# Patient Record
Sex: Male | Born: 1940 | Race: White | Hispanic: No | Smoking: Former smoker
Health system: Southern US, Community
[De-identification: ages and names within clinical notes are randomized; demographics above are authoritative.]

## PROBLEM LIST (undated history)

## (undated) DIAGNOSIS — I2510 Atherosclerotic heart disease of native coronary artery without angina pectoris: Secondary | ICD-10-CM

## (undated) DIAGNOSIS — N189 Chronic kidney disease, unspecified: Secondary | ICD-10-CM

## (undated) DIAGNOSIS — F0280 Dementia in other diseases classified elsewhere without behavioral disturbance: Secondary | ICD-10-CM

## (undated) DIAGNOSIS — G4733 Obstructive sleep apnea (adult) (pediatric): Secondary | ICD-10-CM

## (undated) DIAGNOSIS — E785 Hyperlipidemia, unspecified: Secondary | ICD-10-CM

## (undated) DIAGNOSIS — I447 Left bundle-branch block, unspecified: Secondary | ICD-10-CM

## (undated) DIAGNOSIS — I509 Heart failure, unspecified: Secondary | ICD-10-CM

## (undated) HISTORY — DX: Obstructive sleep apnea (adult) (pediatric): G47.33

## (undated) HISTORY — DX: Hyperlipidemia, unspecified: E78.5

## (undated) HISTORY — DX: Chronic kidney disease, unspecified: N18.9

## (undated) HISTORY — PX: OTHER SURGICAL HISTORY: SHX169

## (undated) HISTORY — DX: Atherosclerotic heart disease of native coronary artery without angina pectoris: I25.10

---
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
## 1999-07-13 ENCOUNTER — Encounter: Payer: Self-pay | Admitting: Family Medicine

## 1999-07-13 ENCOUNTER — Encounter: Admission: RE | Admit: 1999-07-13 | Discharge: 1999-07-13 | Payer: Self-pay | Admitting: Family Medicine

## 2003-05-02 ENCOUNTER — Inpatient Hospital Stay (HOSPITAL_BASED_OUTPATIENT_CLINIC_OR_DEPARTMENT_OTHER): Admission: RE | Admit: 2003-05-02 | Discharge: 2003-05-02 | Payer: Self-pay | Admitting: Cardiology

## 2003-05-02 HISTORY — PX: CARDIAC CATHETERIZATION: SHX172

## 2003-09-21 ENCOUNTER — Ambulatory Visit (HOSPITAL_BASED_OUTPATIENT_CLINIC_OR_DEPARTMENT_OTHER): Admission: RE | Admit: 2003-09-21 | Discharge: 2003-09-21 | Payer: Self-pay | Admitting: Internal Medicine

## 2004-07-29 ENCOUNTER — Ambulatory Visit (HOSPITAL_COMMUNITY): Admission: RE | Admit: 2004-07-29 | Discharge: 2004-07-29 | Payer: Self-pay | Admitting: Gastroenterology

## 2004-07-29 ENCOUNTER — Encounter (INDEPENDENT_AMBULATORY_CARE_PROVIDER_SITE_OTHER): Payer: Self-pay | Admitting: Specialist

## 2004-10-19 ENCOUNTER — Ambulatory Visit: Payer: Self-pay | Admitting: Internal Medicine

## 2005-05-11 DIAGNOSIS — I2510 Atherosclerotic heart disease of native coronary artery without angina pectoris: Secondary | ICD-10-CM | POA: Insufficient documentation

## 2005-05-11 DIAGNOSIS — N401 Enlarged prostate with lower urinary tract symptoms: Secondary | ICD-10-CM | POA: Insufficient documentation

## 2006-02-02 ENCOUNTER — Ambulatory Visit: Payer: Self-pay | Admitting: Internal Medicine

## 2006-06-02 ENCOUNTER — Encounter: Admission: RE | Admit: 2006-06-02 | Discharge: 2006-06-02 | Payer: Self-pay | Admitting: Family Medicine

## 2006-06-13 ENCOUNTER — Encounter: Admission: RE | Admit: 2006-06-13 | Discharge: 2006-09-11 | Payer: Self-pay | Admitting: Family Medicine

## 2006-09-12 ENCOUNTER — Encounter: Payer: Self-pay | Admitting: Cardiology

## 2006-09-15 DIAGNOSIS — K2100 Gastro-esophageal reflux disease with esophagitis, without bleeding: Secondary | ICD-10-CM | POA: Insufficient documentation

## 2006-09-27 DIAGNOSIS — E46 Unspecified protein-calorie malnutrition: Secondary | ICD-10-CM | POA: Insufficient documentation

## 2006-11-21 DIAGNOSIS — H26229 Cataract secondary to ocular disorders (degenerative) (inflammatory), unspecified eye: Secondary | ICD-10-CM | POA: Insufficient documentation

## 2007-01-18 ENCOUNTER — Ambulatory Visit: Payer: Self-pay | Admitting: Internal Medicine

## 2007-05-28 DIAGNOSIS — G4730 Sleep apnea, unspecified: Secondary | ICD-10-CM | POA: Insufficient documentation

## 2008-01-05 ENCOUNTER — Encounter: Admission: RE | Admit: 2008-01-05 | Discharge: 2008-01-05 | Payer: Self-pay | Admitting: Specialist

## 2008-01-13 ENCOUNTER — Encounter: Payer: Self-pay | Admitting: Internal Medicine

## 2008-01-18 ENCOUNTER — Ambulatory Visit: Payer: Self-pay | Admitting: Internal Medicine

## 2008-01-18 DIAGNOSIS — I2510 Atherosclerotic heart disease of native coronary artery without angina pectoris: Secondary | ICD-10-CM | POA: Insufficient documentation

## 2008-01-18 DIAGNOSIS — E785 Hyperlipidemia, unspecified: Secondary | ICD-10-CM | POA: Insufficient documentation

## 2008-01-18 DIAGNOSIS — G4733 Obstructive sleep apnea (adult) (pediatric): Secondary | ICD-10-CM | POA: Insufficient documentation

## 2008-03-01 ENCOUNTER — Encounter: Admission: RE | Admit: 2008-03-01 | Discharge: 2008-03-01 | Payer: Self-pay | Admitting: Specialist

## 2008-04-01 ENCOUNTER — Telehealth (INDEPENDENT_AMBULATORY_CARE_PROVIDER_SITE_OTHER): Payer: Self-pay | Admitting: *Deleted

## 2008-04-04 ENCOUNTER — Ambulatory Visit (HOSPITAL_BASED_OUTPATIENT_CLINIC_OR_DEPARTMENT_OTHER): Admission: RE | Admit: 2008-04-04 | Discharge: 2008-04-04 | Payer: Self-pay | Admitting: Specialist

## 2008-06-12 ENCOUNTER — Ambulatory Visit (HOSPITAL_COMMUNITY): Admission: RE | Admit: 2008-06-12 | Discharge: 2008-06-12 | Payer: Self-pay | Admitting: Cardiology

## 2008-06-12 HISTORY — PX: CARDIAC CATHETERIZATION: SHX172

## 2009-01-15 ENCOUNTER — Encounter: Payer: Self-pay | Admitting: Internal Medicine

## 2009-01-16 ENCOUNTER — Ambulatory Visit: Payer: Self-pay | Admitting: Internal Medicine

## 2009-03-18 DIAGNOSIS — L821 Other seborrheic keratosis: Secondary | ICD-10-CM | POA: Insufficient documentation

## 2009-03-18 DIAGNOSIS — D369 Benign neoplasm, unspecified site: Secondary | ICD-10-CM | POA: Insufficient documentation

## 2009-10-18 ENCOUNTER — Encounter: Payer: Self-pay | Admitting: Internal Medicine

## 2009-12-23 DIAGNOSIS — I10 Essential (primary) hypertension: Secondary | ICD-10-CM | POA: Insufficient documentation

## 2009-12-23 DIAGNOSIS — L570 Actinic keratosis: Secondary | ICD-10-CM | POA: Insufficient documentation

## 2010-01-15 ENCOUNTER — Ambulatory Visit: Payer: Self-pay | Admitting: Internal Medicine

## 2010-01-19 ENCOUNTER — Ambulatory Visit: Payer: Self-pay | Admitting: Cardiology

## 2010-04-01 NOTE — Assessment & Plan Note (Signed)
Summary: rov 1 yr ///kp   Primary Brylen Wagar/Referring Kamry Faraci:  Herb Grays  CC:  Yearly follow up visit-sleep; has trouble falling asleep sometimes. .  History of Present Illness: CAD (ICD-414.00) OBSTRUCTIVE SLEEP APNEA (ICD-327.23)  HISTORY:  He remains comfortable on CPAP at 10-CWP.  He has the computer chip to turn in from his machine having recently gotten a new machine but he feels comfortable with it.  He has had flu vaccine.   01/18/08- Sleep apnea Excellent CPAP compliance. Follow up check showed 10 cwp gave AHI 2.6 so control is good. Discussed mask fit. He is satisfied. Discussed flu season- got flu vax.  Feb 07, 2009- OSA Tried full face cpap mask but it leaks too much. He has a download card ready to turn in. Also asks about specialty pillow. had flu vax.  January 15, 2010-- OSA Nurse-CC: Yearly follow up visit-sleep; has trouble falling asleep sometimes.  Lost his wife 3 months ago. He is adjusting, but insomnia component with grieving.  He uses CPAP all night and for any naps. Current mask is full face. We spoke about different mask styles.    Preventive Screening-Counseling & Management  Alcohol-Tobacco     Smoking Status: quit     Year Quit: 90's  Current Medications (verified): 1)  Slo-Niacin 500 Mg Cr-Tabs (Niacin) .... Take 1 By Mouth  Two Times A Day 2)  Aspirin Adult Low Strength 81 Mg Tbec (Aspirin) .... Take 1 By Mouth Once Daily 3)  Centrum Silver  Tabs (Multiple Vitamins-Minerals) .... Take 1 By Mouth Once Daily 4)  Glucosamine 1500 Complex  Caps (Glucosamine-Chondroit-Vit C-Mn) .... Take 1 By Mouth Once Daily 5)  Red Yeast Rice 600 Mg Tabs (Red Yeast Rice Extract) .... Take As Directed 6)  Vitamin C 500 Mg Tabs (Ascorbic Acid) .... Take As Directed 7)  Fish Oil 1000 Mg Caps (Omega-3 Fatty Acids) .... Take As Directed 8)  Cpap 10 [facility] 9)  Replacement Cpap Mask and Supplies .... Dx Obstructive Sleep Apnea 10)  Crestor 20 Mg Tabs  (Rosuvastatin Calcium) .... Take 1/2 By Mouth Every Other Day 11)  Lasix 20 Mg Tabs (Furosemide) .... Take 1 By Mouth At Bedtime  Allergies (verified): 1)  ! Sulfa  Past History:  Past Medical History: Last updated: 01/18/2008 OBSTRUCTIVE SLEEP APNEA (ICD-327.23) HYPERLIPIDEMIA (ICD-272.4) CAD (ICD-414.00)  Past Surgical History: Last updated: February 07, 2009 Right shoulder left wrist/ arm- repair fx  Family History: Last updated: 2009/02/07 Mother- died esophageal cancer Father- died age 40 "od age' Brother- - had lymphoma  Social History: Last updated: 01/15/2010 Patient states former smoker. 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    Vital Signs:  Patient profile:   70 year old male Weight:      213.25 pounds O2 Sat:      96 % on Room air Pulse rate:   76 / minute BP sitting:   146 / 84  (left arm) Cuff size:   regular  Vitals Entered By: Reynaldo Minium CMA (January 15, 2010 8:57 AM)  O2 Flow:  Room air CC: Yearly follow up visit-sleep;  has trouble falling asleep sometimes.    Physical Exam  Additional Exam:  General: A/Ox3; pleasant and cooperative, NAD, obese SKIN: no rash, lesions NODES: no lymphadenopathy HEENT: /AT, EOM- strabismus, Conjuctivae- clear, PERRLA, TM-WNL, Nose- clear, Throat- clear and wnl, Mallampative III-IV NECK: Supple w/ fair ROM, JVD- none, normal carotid impulses w/o bruits Thyroid- normal to palpation CHEST: Clear to P&A HEART: RRR, no m/g/r  heard ABDOMEN: Soft and nl;  ZOX:WRUE, nl pulses, no edema  NEURO: Grossly intact to observation      Impression & Recommendations:  Problem # 1:  OBSTRUCTIVE SLEEP APNEA (ICD-327.23)  Great compliance and control w/ CPAP. He does have some concerns about the contractual arrangement with [facility] and I suggested he talk w/ our [facility] about alternative DME providers.  I don't think he will need extra medication help fopr sleep now after losing wife.   Medications Added to Medication List This Visit: 1)  Slo-niacin 500 Mg Cr-tabs (Niacin) .... Take 1 by mouth  two times a day  Other Orders: Est. Patient Level III (45409)  Patient Instructions: 1)  Please schedule a follow-up appointment in 1 year. 2)  See [facility] to get names of alternative CPAP providers. You can talk with their business offices about arrangements that might suit you.

## 2010-04-01 NOTE — Letter (Signed)
Summary: CPAP Supplies/[facility]  CPAP Supplies/[facility]   Imported By: Sherian Rein 10/26/2009 08:01:05  _____________________________________________________________________  External Attachment:    Type:   Image     Comment:   External Document

## 2010-04-29 DIAGNOSIS — I69998 Other sequelae following unspecified cerebrovascular disease: Secondary | ICD-10-CM | POA: Insufficient documentation

## 2010-05-19 DIAGNOSIS — F330 Major depressive disorder, recurrent, mild: Secondary | ICD-10-CM | POA: Insufficient documentation

## 2010-05-19 DIAGNOSIS — R3915 Urgency of urination: Secondary | ICD-10-CM | POA: Insufficient documentation

## 2010-05-19 DIAGNOSIS — E559 Vitamin D deficiency, unspecified: Secondary | ICD-10-CM | POA: Insufficient documentation

## 2010-06-15 LAB — CBC
HCT: 43.6 % (ref 39.0–52.0)
MCHC: 34 g/dL (ref 30.0–36.0)
MCV: 97.7 fL (ref 78.0–100.0)
RBC: 4.46 MIL/uL (ref 4.22–5.81)
WBC: 4.8 10*3/uL (ref 4.0–10.5)

## 2010-06-15 LAB — BASIC METABOLIC PANEL
BUN: 17 mg/dL (ref 6–23)
CO2: 28 mEq/L (ref 19–32)
Calcium: 9.2 mg/dL (ref 8.4–10.5)
Chloride: 104 mEq/L (ref 96–112)
Creatinine, Ser: 1.03 mg/dL (ref 0.4–1.5)
GFR calc Af Amer: >60 mL/min (ref >60)
GFR calc non Af Amer: >60 mL/min (ref >60)
Glucose, Bld: 110 mg/dL — ABNORMAL HIGH (ref 70–99)
Potassium: 3.8 mEq/L (ref 3.5–5.1)
Sodium: 137 mEq/L (ref 135–145)

## 2010-06-15 LAB — PROTIME-INR: Prothrombin Time: 13.6 seconds (ref 11.6–15.2)

## 2010-07-13 NOTE — Op Note (Signed)
NAMEALEXANDER, [name] [name]               ACCOUNT NO.:  [id number]   MEDICAL RECORD NO.:  [id number]          PATIENT TYPE:  AMB   LOCATION:  [facility]                         FACILITY:  [facility]   PHYSICIAN:  Erasmo Leventhal, M.D.DATE OF BIRTH:  1940/10/11   DATE OF PROCEDURE:  04/04/2008  DATE OF DISCHARGE:                               OPERATIVE REPORT   PREOPERATIVE DIAGNOSES:  1. Right shoulder multifactorial pain.  2. Impingement syndrome.  3. Possible cuff tear.  4. Biceps partial tear.  5. Acromioclavicular arthritis.   POSTOPERATIVE DIAGNOSES:  1. Right shoulder diffuse glenohumeral labral tearing.  2. Extensive partial tear of biceps tendon.  3. 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  4. Impingement syndrome.  5. Acromioclavicular arthritis.   PROCEDURE:  1. Right shoulder glenohumeral arthroscopy with intra-articular labral      debridement.  2. Chondroplasty of glenoid.  3. Biceps tenotomy.  4. Arthroscopic subacromial decompression.  5. Acromioplasty.  6. Bursectomy of acromioclavicular ligaments.  7. Arthroscopic resection.  8. Mumford procedure.   SURGICAL ASSISTANT:  Jaquelyn Bitter. Chabon, P.A.-C.   ANESTHESIA:  Interscalene block, general.   BLOOD LOSS:  Less than 10 mL.   DRAINS:  None.   COMPLICATIONS:  None.   DISPOSITION:  PACU, stable.   DETAILS:  Patient was counseled in the holding area, correct site was  identified, IV started, antibiotics given, interscalene block was  administered.  Taken to operating room, placed in supine position.  General anesthesia.  Turned to the left lateral decubitus position,  appropriately padded and bumped.  Right shoulder examined.  Full range  of motion, stable.  Prepped with DuraPrep, draped in sterile fashion.   Overhead shoulder positioner utilized, 30 degrees abducted, taken to  full flexion, 10 pounds longitudinal traction placed and portals  created.  Arthroscope placed in glenohumeral joint.   Diagnostic  arthroscopy led to the following findings.  There was extensive partial  tearing of the biceps tendon with marked tendinopathy, diffuse glenoid  labral tearing from 9 o'clock to 3 o'clock.  Grade 3 chondromalacia of  glenoid and humeral head.  A __________partial tear of the supinatus  tendon.   Interval was made with outside-in technique through the rotator cuff  interval.  Motorized shaver  was introduced.  __________ was debrided  back to healthy tissue, smoothed down with the ArthroCare system.  Then  chondroplasty was performed of the glenoid with a mechanical shaver.   Arthroscopic biceps tenotomy was performed, releasing the biceps tendon  and debriding the stump in the intra-articular aspect.   Also the undersurface was debrided of the partial rotator cuff tear.   Irrigant and arthroscopic equipment was removed.   Subacromial bursa, subacromial bursectomy performed through the lateral  portal.  ArthroCare system and was utilized to release the periosteum,  CA ligament.  Bur was then placed posteriorly and an anterior-inferior  acromioplasty confirmed, converting to a flat acromion morphology.  The  [facility] joint was found be markedly osteoarthritic with subclavicular spur.   Bur was then placed from anterior to lateral, 5-8 mm of clavicle was  removed circumferentially,  leaving the superior and posterior  acromioclavicular capsule and ligaments intact.  Clavicle was palpated,  found to be stable.  Arthroscopic equipment removed.  Hemostasis  obtained.  The rotator cuff and bursal surface showed fraying but no  frank tear.  No other abnormalities were noted.  Irrigant and  arthroscopic equipment was removed.  He was then taken out of traction.  He had normal pulses in the wrist at the end of the case.  Portals were  closed with a running suture.  20 mL of 5% Marcaine with epinephrine  were placed in portal sites, subacromial region, being careful not to  put it into  the joint.  A sterile dressing was applied.  Patient placed  in a sling, turned supine, awakened, taken from the operating room to  the PACU in stable condition.  Sponge and needle counts correct.  No  complications or problems.   To help with surgical technique, decision-making, Mr. Leilani Able PA-  C's assistance was needed throughout the entire case.           ______________________________  Erasmo Leventhal, M.D.     RAC/MEDQ  D:  04/04/2008  T:  04/04/2008  Job:  [id number]

## 2010-07-13 NOTE — Assessment & Plan Note (Signed)
[facility]                             PULMONARY OFFICE NOTE   NAME:[name], [name]                      MRN:          [id number]  DATE:01/18/2007                            DOB:          February 24, 1941    PROBLEM LIST:  1. Obstructive sleep apnea with hypersomnia.  2. Coronary disease.  3. Hyperlipidemia.   HISTORY:  He remains comfortable on CPAP at 10-CWP.  He has the computer  chip to turn in from his machine having recently gotten a new machine  but he feels comfortable with it.  He has had flu vaccine.   MEDICATIONS:  1. Niaspan 2,000 mg.  2. Aspirin 81 mg.  3. Multivitamin.  4. Glucosamine.  5. CPAP 10-CWP.  6. Red Yeast Rice.  7. Vitamin C.  8. Fish oil.  9. Torsemide 20 mg x1/2.   No medication allergy.   OBJECTIVE:  VITAL SIGNS:  Weight 216 pounds, BP 110/68, pulse 77, room  air saturation 97%.  GENERAL:  He is alert.  HEENT:  Right eye deviates.  Nasal airway clear.  Speech clear.  NECK:  Without stridor or neck vein distention.  HEART:  Sounds are regular without murmur or gallop.  LUNGS:  Fields are quiet and clear.  Breathing is unlabored.  EXTREMITIES:  Without cyanosis, clubbing, or edema.  NEUROLOGIC:  Unremarkable to observation.   IMPRESSION:  Obstructive sleep apnea with good control on CPAP at 10-  CWP.  His weight has been stable over the past year and was discussed.   PLAN:  1. Maintain good sleep hygiene, try to keep weight down.  2. Schedule return in 1 year, earlier p.r.n.     [name] D. Maple Hudson, MD, Tonny Bollman, FACP  Electronically Signed    CDY/MedQ  DD: 01/20/2007  DT: 01/21/2007  Job #: [id number]   cc:   Marjory Lies, M.D.  Peter M. [place], M.D.

## 2010-07-13 NOTE — Cardiovascular Report (Signed)
[name], [name] NO.:  [id number]   MEDICAL RECORD NO.:  [id number]          PATIENT TYPE:  OIB   LOCATION:  2899                         FACILITY:  [facility]   PHYSICIAN:  [name] [name], M.D.  DATE OF BIRTH:  January 23, 1941   DATE OF PROCEDURE:  DATE OF DISCHARGE:  06/12/2008                            CARDIAC CATHETERIZATION   INDICATIONS FOR PROCEDURE:  A 70 year old white male with known history  of coronary artery disease.  He presents with symptoms of increased  dyspnea on exertion.   PROCEDURE:  Left heart catheterization, coronary and left ventricular  angiography, intracoronary flow via assessment of the LAD, access via  the right femoral artery using standard Seldinger technique.   EQUIPMENT:  A 6-French 4-cm left Judkins catheter, 6-French 4-cm right  Judkins catheter, 6-French pigtail catheter, 6-French arterial sheath, 6-  French left FL-4 guide, a FloWire.   MEDICATIONS:  Local anesthesia 1% Xylocaine, Angiomax bolus at 0.75  mg/kg IV followed by continuous infusion 1.75 mg/kg per hour.  ACT was  445.  Adenosine 16 mcg intracoronary x3.   CONTRAST:  Omnipaque 165 mL.   HEMODYNAMIC DATA:  Aortic pressure was 108/68 with a mean of 86 mmHg,  left ventricular pressure was 115 with an EDP of 20 mmHg.   ANGIOGRAPHIC DATA:  The left coronary artery arises and distributes  normally.  The left main coronary artery is short and is normal.   The left anterior descending artery has mild-to-moderate calcification.  There is diffuse 40-50% disease in the mid LAD.  Within this segment of  disease, there is a more focal 70% eccentric stenosis.  The first  diagonal is without significant disease.   Left circumflex coronary is a large dominant vessel.  There is 20-30%  plaque in the first obtuse marginal vessel.  Otherwise, the left  circumflex coronary artery appears normal.   The right coronary is a small nondominant vessel and appears normal.   Left  ventricular angiography was performed in the RAO view.  This  demonstrates normal left ventricular size and contractility with normal  systolic function.  Ejection fraction is estimated 65%.   Given the intermediate stenosis in the mid LAD and the patient's recent  symptoms, we proceeded with physiologic testing of the LAD stenosis  using a FloWire.  After the patient was anticoagulated, we were able to  cross the lesion in the LAD easily with the FloWire.  We obtained 3 sets  to hemodynamic measurements following injection of 60 mcg of  intracoronary adenosine.  This yielded fractional flow reserve  measurements of 0.89 and 0.88.  These findings were felt to indicate  that the lesion was non-flow limiting and would be best treated  medically.  We subsequently performed angiogram of the right femoral  artery.  His right groin was sealed using an Angio-Seal device with  excellent hemostasis.   FINAL INTERPRETATION:  1. Single-vessel atherosclerotic coronary disease.  There is a      moderate intermediate stenosis in the mid left anterior descending      .  Based on fractional flow reserve measurements,  it is felt that      this is non-flow limiting.  2. Normal left ventricular function.   PLAN:  Continue medical therapy.           ______________________________  [name] [name], M.D.     PMJ/MEDQ  D:  06/12/2008  T:  06/13/2008  Job:  [id number]   cc:   [name] [name], M.D.

## 2010-07-13 NOTE — H&P (Signed)
[name] [name], [name] [name]               ACCOUNT NO.:  [id number]   MEDICAL RECORD NO.:  [id number]          PATIENT TYPE:  AMB   LOCATION:  [facility]                         FACILITY:  [facility]   PHYSICIAN:  Peter M. [place]il[place], M.D.  DATE OF BIRTH:  1940/05/31   DATE OF ADMISSION:  04/04/2008  DATE OF DISCHARGE:  04/04/2008                              HISTORY & PHYSICAL   HISTORY OF PRESENT ILLNESS:  Mr. Lenderman is a 70 year old white male with  known history of coronary artery disease.  He underwent cardiac  catheterization in 2005.  This demonstrate a 70% stenosis in the mid  LAD.  Left ventricular function was normal.  He has been treated  medically and has really had minimal symptoms of angina until recently.  More recently, he has noticed that when he is working outside, he has  had increasing shortness of breath.  His dyspnea is worse when he is  going up hill in his yard and he also has associated increasing fatigue.  He denies any significant chest pain.  Previously, we recommended  medical therapy for a single vessel disease, but now with his increasing  symptomatology, it was recommended he undergo repeat cardiac  catheterization and consideration for a possible intervention for  symptom relief.   PAST MEDICAL HISTORY:  1. Coronary artery disease.  2. Chronic renal insufficiency.  3. Hypercholesterolemia, combined.  4. Status post right shoulder surgery.  5. History of obstructive sleep apnea.   CURRENT MEDICATIONS:  1. Glucosamine daily.  2. Aspirin 81 mg per day.  3. Omega-3 fish oil 1000 mg b.i.d.  4. Multivitamin daily.  5. Torsemide 20 mg one-half tablet daily.  6. Metamucil 2 teaspoons daily.  7. Niacin 1000 mg b.i.d.   The patient was just recently started on Crestor 5 mg every other day.   ALLERGIES:  He has been intolerant to ZETIA and LIPITOR in the past.   SOCIAL HISTORY:  The patient is retired.  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  He he is married.  He  denies tobacco or alcohol use.  His  has 2 children.   FAMILY HISTORY:  Mother died at age 84, with cancer.  Father died at age  74, due to old age.  He has 2 siblings, who are alive and well.  The  patient also reports allergy to SULFA.   REVIEW OF SYSTEMS:  He denies any chest pain.  He has had no  claudication symptoms.  He denies any edema, orthopnea, or PND.  He has  had no history of TIA or stroke.  He has had no change of bowel or  bladder habits.  All other systems were reviewed and are negative.   PHYSICAL EXAMINATION:  GENERAL:  The patient is pleasant white male, in  no apparent distress.  Weight is 210, blood pressure is 130/80, and  pulse 70 and regular.  HEENT:  Normocephalic and atraumatic.  Pupils are equal, round, and  reactive to light accommodation.  Extraocular movements are full.  Oropharynx is clear.  NECK:  Supple without JVD, adenopathy, thyromegaly, or bruits.  LUNGS:  Clear.  CARDIAC:  Regular rate and rhythm without gallop, murmur, rub, or click.  ABDOMEN:  Soft and nontender.  He has no hepatosplenomegaly, mass, or  bruits.  Femoral and pedal pulses are 2+ and symmetric.  NEUROLOGIC:  The patient is alert and oriented x4.  His cranial nerves  II through XII are intact.  He has no focal findings.   LABORATORY DATA:  ECG at rest shows normal sinus rhythm with first-  degree AV block otherwise normal.  Recent cholesterol was 197, HDL 55,  triglycerides 81, and LDL of 125.   IMPRESSION:  1. Symptoms of increased dyspnea on exertion and fatigue on exertion      consistent with anginal equivalent symptoms, and the patient with      known moderate mid-left anterior descending stenosis 5 years ago,      left cardiac catheterization.  2. Hyperlipidemia.  3. Obstructive sleep apnea.  4. History of mild renal insufficiency.   PLAN:  We will proceed with diagnostic cardiac catheterization with  potential intervention if indicated.            ______________________________  Peter M. [place], M.D.     PMJ/MEDQ  D:  06/09/2008  T:  06/09/2008  Job:  [id number]   cc:   Tammy R. Collins [place], M.D.

## 2010-07-16 NOTE — Cardiovascular Report (Signed)
NAME:  [name] [name], [name] [name]                         ACCOUNT NO.:  [id number]   MEDICAL RECORD NO.:  [id number]                   PATIENT TYPE:  OIB   LOCATION:  6501                                 FACILITY:  [facility]   PHYSICIAN:  Peter M. [place]and, M.D.               DATE OF BIRTH:  [birth year]   DATE OF PROCEDURE:  05/02/2003  DATE OF DISCHARGE:                              CARDIAC CATHETERIZATION   PROCEDURES PERFORMED:  1. Left heart catheterization.  2. Coronary and left ventricular angiography.   CARDIOLOGIST:  Peter M. [place], M.D.   INDICATIONS FOR PROCEDURE:  This is a 70 year old white male with a history  of hypercholesterolemia who presents with symptoms of dyspnea on exertion  and fatigue.  Exercise stress test was abnormal showing evidence of  ischemia.   ACCESS:  Access is via the right femoral artery using the standard Seldinger  technique.   EQUIPMENT:  The equipment used; 4 French 4 cm right and left Judkins  catheters, 4 French pigtail catheter and 4 French arterial sheath.   MEDICATIONS:  Local anesthesia with 1% Xylocaine.   CONTRAST MATERIAL:  One-hundred-twenty milliliters of Omnipaque.   HEMODYNAMIC DATA:  Aortic pressure 117/68 with a mean of 92 mmHg.  Left  ventricular pressure is 120 with an EDP of 21 mmHg.   ANGIOGRAPHIC DATA:  Left Coronary Artery:  The left coronary artery arises  and distributes in a dominant fashion.   Left Main Coronary Artery:  The left main coronary artery is normal.   Left Anterior Descending Artery:  The left anterior descending artery shows  a segmental area of disease in the midvessel spanning the first diagonal  branch.  The LAD tapers to a 70% stenosis following this diagonal branch.   Left Circumflex Coronary Artery:  The left circumflex coronary artery is a  large dominant vessel.  There is 20% narrowing in the proximal part of this  vessel.  The first marginal branch has a 30% narrowing at the proximal  vessel.   Right  coronary Artery:  The right coronary artery is a small nondominant  vessel and appears normal.   Left Ventricular Angiography:  Left ventricular angiography was performed in  the RAO view.  This demonstrates normal left ventricular size and  contractility with normal systolic function.  Ejection fraction is estimated  at 65%.  There is no mitral valve prolapse or regurgitation.   FINAL INTERPRETATION:  1. Single-vessel obstructive coronary artery disease with moderate stenosis     in the mid left anterior descending.  2. Normal left ventricular function.                                               Peter M. [place], M.D.    PMJ/MEDQ  D:  05/02/2003  T:  05/03/2003  Job:  [id number]   cc:   Dara Hoyer, M.D.  [facility]

## 2010-07-16 NOTE — Procedures (Signed)
NAME:  [name] [name], [name] [name]             ACCOUNT NO.:  [id number]   MEDICAL RECORD NO.:  [id number]          PATIENT TYPE:  OUT   LOCATION:  SLEEP CENTER                 FACILITY:  [facility]   PHYSICIAN:  Clinton D. Maple Hudson, M.D. DATE OF BIRTH:  16-Jan-1941   DATE OF ADMISSION:  09/21/2003  DATE OF DISCHARGE:  09/21/2003                              NOCTURNAL POLYSOMNOGRAM   REFERRING PHYSICIAN:  Dr. Jetty Duhamel.   INDICATIONS FOR STUDY/HISTORY:  Hypersomnia with sleep apnea.  Complaints of  snoring and daytime somnolence.  Previous diagnosis of obstructive sleep  apnea, for which has used CPAP for 10 years.  Now set at 8.5 CWP.   MEDICATIONS:  Include Lipitor, glucosamine chondroitin, Metamucil, Niaspan,  aspirin.   EPWORTH SCORE:  11/24.   BMI:  30.   WEIGHT:  210 pounds.   SLEEP ARCHITECTURE:  322 minutes of recorded total sleep time with a sleep  efficiency of 80%.  Stage I was 9%.  Stage II 72%.  Stage III and IV 9%.  REM was 10% of total sleep time.  Latency to sleep onset was 12 minutes.  Latency to REM was 80 minutes.  Awake after sleep onset was 72 minutes.  Arousal index 18 per hour.   RESPIRATORY DATA:  CPAP titration protocol.  CPAP was titrated to 10 CWP,  RDI of 0 per hour using patient's standard wide ResMed Ultra Mirage nasal  mask.  He tolerated the CPAP well.   OXYGEN DATA:  Baseline room-air oxygen saturation 89% before sleep.  Moderate snoring was prevented at final CPAP titration.  Mean oxygen  saturation through the study was 96-97% with a nadir of 89%.   CARDIAC DATA:  Regular sinus bradycardia, mostly ranging from 52-81 beats  per minute with no significant ectopic rhythm.   IMPRESSION/RECOMMENDATIONS:  1. Obstructive sleep apnea/hypopnea syndrome with excellent control of CPAP     of 10 CWP.  2. Sinus bradycardia.  3. 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                     ______________________________                                Rennis Chris Maple Hudson, M.D.                                Diplomate, American Board of Sleep Medicine    CDY/MEDQ  D:  09/28/2003 11:09:23  T:  09/28/2003 17:41:57  Job:  [id number]

## 2010-07-16 NOTE — Assessment & Plan Note (Signed)
[facility]                             PULMONARY OFFICE NOTE   NAME:[name] [name], [name] [name]                      MRN:          [id number]  DATE:02/02/2006                            DOB:          1940/03/10    PROBLEMS:  1. Obstructive sleep apnea with hypersomnia.  2. Coronary disease.   HISTORY:  Last here in 2006. He is having no problems with CPAP  currently. There were complaints in July and August from family that he  was snoring through his mask, but that has stopped. We are not sure  about marginal weight change in that period, but he has gained some  weight since last here. He does not notice significant nasal congestion,  or daytime sleepiness. 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 Some  considerable stress related to his wife's illness. CPAP remains set at  10 CWP.   MEDICATIONS:  1. Niaspan 2000 mg.  2. Aspirin 81 mg.  3. Centrum Silver.  4. Glucosamine.  5. Red rice yeast.  6. Fish oil.  No medication allergy.   OBJECTIVE:  Weight 217 pounds, compared with 204 pounds last year. Blood  pressure 118/80, pulse regular 66, room air saturation 98%. He is  overweight. He seems alert. There are no pressure marks on his face from  CPAP mask, and no evident nasal congestion. Voice quality is normal. No  neck vein distension. Lungs are clear. Breathing is quiet and unlabored.  Heart sounds are regular without murmur. There is no edema.   IMPRESSION:  Obstructive sleep apnea currently stable despite weight  gain. Significance of breakthrough snoring was discussed. We may end up  needing to increase his pressure a bit, but we will wait and watch for  now. General supportive measures for sleep apnea were reviewed. Schedule  return 1 year, earlier P.R.N.     Clinton D. Maple Hudson, MD, Tonny Bollman, FACP  Electronically Signed    CDY/MedQ  DD: 02/04/2006  DT: 02/05/2006  Job #: [id number]   cc:   Marjory Lies, M.D.  Peter M. Swaziland, M.D.

## 2010-07-16 NOTE — H&P (Signed)
NAME:  [name] [name], [name] [name] NO.:  [id number]   MEDICAL RECORD NO.:  [id number]                   PATIENT TYPE:  OIB   LOCATION:                                       FACILITY:  [facility]   PHYSICIAN:  Peter M. Swaziland, M.D.               DATE OF BIRTH:  Apr 11, 1940   DATE OF ADMISSION:  05/02/2003  DATE OF DISCHARGE:                                HISTORY & PHYSICAL   HISTORY OF PRESENT ILLNESS:  [name] [name] is a very pleasant 70 year old white  male with a history of hypercholesterolemia who presented with predominant  symptoms of dyspnea on exertion and fatigue on exertion.  He denies any  chest pain.  He had also had intermittent episodes of tachy palpitations,  but this has actually been less over the past year.  He had a previous  stress test approximately 10 years ago that was normal.  He states that his  symptoms of dyspnea and fatigue have been going on for approximately one  year.  To further evaluate his symptoms, he underwent an exercise stress  test on April 29, 2003.  He was able to exercise for 7 minutes and 14 seconds  on the Bruce protocol with associated symptoms of dyspnea and fatigue.  He  had no chest pain.  ECG showed 2 mm of ST segment depression  inferolaterally, consistent with ischemia.  He is now admitted for cardiac  catheterization.   ALLERGIES:  SULFA.   CURRENT MEDICATIONS:  1. Niaspan 1000 mg p.o. q.h.s.  2. Glucosamine.  3. Aspirin 81 mg per day.   PAST MEDICAL HISTORY:  Significant for hypercholesterolemia.  He also had  pin placement in his right arm.   SOCIAL HISTORY:  The patient is retired from [employer].  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.  He  is married and has two children.  He quit smoking 10 years ago and denies  alcohol use.   FAMILY HISTORY:  Father died at age 24 of old age.  He was also  hypertensive.  Mother died at age 64 with cancer and she was  hypertensive  and two siblings are alive and well.   REVIEW OF SYMPTOMS:  He does note intermittent symptoms of tachy  palpitations of sudden onset, typically lasting less than 10 to 15 minutes.  This was much more prominent when he was working full time and under a lot  of stress and also drinking a lot more caffeine.  Now these symptoms occur  less than once a month and are typically very mild.  All other review of  systems is negative.   PHYSICAL EXAMINATION:  GENERAL:  The patient is a pleasant, overweight white  male in no apparent distress.  VITAL SIGNS:  His pulse rate is 82; blood pressure is 130/80; respirations  are 20.  HEENT:  Normocephalic, atraumatic.  He is balding.  His pupils are equal,  round and reactive to light and accommodation.  Extraocular movements are  full.  Oropharynx is clear.  NECK:  Supple without JVD, adenopathy, thyromegaly or bruits.  LUNGS:  Clear to auscultation and percussion.  CARDIAC:  Regular rate and rhythm, normal S1 and S2 without gallops,  murmurs, rubs or clicks.  ABDOMEN:  Soft and nontender.  He has no hepatosplenomegaly, masses or  bruits.  Femoral and pedal pulses are 2+ and symmetric.  NEUROLOGIC:  Nonfocal.   LABORATORY DATA:  Chest x-ray shows some mild increased basilar markings.  Otherwise, no active disease.  Resting ECG is normal.   IMPRESSION:  1. Dyspnea on exertion and fatigue with abnormal exercise stress test     suggestive of ischemia.  2. Hypercholesterolemia.   PLAN:  We will undergo coronary angiography with further therapy pending  these results.                                                Peter M. Swaziland, M.D.    PMJ/MEDQ  D:  04/29/2003  T:  04/29/2003  Job:  [id number]   cc:   Teena Irani. Arlyce Dice, M.D.  [id number]  [place]  [place] [address]  Fax: [phone]

## 2010-07-20 ENCOUNTER — Telehealth: Payer: Self-pay | Admitting: Internal Medicine

## 2010-07-20 DIAGNOSIS — G4733 Obstructive sleep apnea (adult) (pediatric): Secondary | ICD-10-CM

## 2010-07-20 NOTE — Telephone Encounter (Signed)
Order sent to [facility] for CPAP supplies.

## 2010-07-20 NOTE — Telephone Encounter (Signed)
[id number] with Alinda Money.Carron Curie, CMA

## 2010-07-20 NOTE — Telephone Encounter (Signed)
Spoke with Alinda Money and he states he just needs an RX for CPAP supplies for the pt. Please advise if ok to send order.Carron Curie, CMA

## 2010-07-20 NOTE — Telephone Encounter (Signed)
[facility] - replacement CPAP supplies- dx obstructive sleep apnea

## 2010-07-20 NOTE — Telephone Encounter (Signed)
TONY CHRISTY FROM [facility] NEEDS PRESCRIPTION FOR CPAP SUPPLIES IN ORDER TO DISPENSE THEM.  HIS # IS [id number].

## 2010-07-23 ENCOUNTER — Encounter: Payer: Self-pay | Admitting: Cardiology

## 2010-07-29 ENCOUNTER — Encounter: Payer: Self-pay | Admitting: Cardiology

## 2010-07-29 DIAGNOSIS — D696 Thrombocytopenia, unspecified: Secondary | ICD-10-CM | POA: Insufficient documentation

## 2010-10-07 ENCOUNTER — Telehealth: Payer: Self-pay | Admitting: Cardiology

## 2010-10-07 NOTE — Telephone Encounter (Signed)
States he was out mowing yard yesterday and got "real hot". Felt lightheaded. Had been drinking a lot of water. After sitting down in shade for awhile felt better and continued mowing. No weakness of extremities or blurred vision or passing out. Just felt weak. Today feels ok but weak. Per Dr. Swaziland just needs to stay hydrated and stay out of heat,

## 2010-10-07 NOTE — Telephone Encounter (Signed)
Pt states may have had heat stroke/ regular stroke while doing yard work yesterday in the day around 10:30am, pt states is ok now but would like to know if he could be seen sooner and would like to speak to the nurse, his next office visit is for 9/19 for Dr. Swaziland

## 2010-10-18 ENCOUNTER — Other Ambulatory Visit: Payer: Self-pay | Admitting: *Deleted

## 2010-10-18 MED ORDER — ROSUVASTATIN CALCIUM 10 MG PO TABS: 10.0000 mg | ORAL_TABLET | Freq: Every day | ORAL | Status: DC

## 2010-10-18 NOTE — Telephone Encounter (Signed)
escribe medication per fax request  

## 2010-11-03 ENCOUNTER — Encounter: Payer: Self-pay | Admitting: *Deleted

## 2010-11-03 DIAGNOSIS — N1831 Chronic kidney disease, stage 3a: Secondary | ICD-10-CM | POA: Insufficient documentation

## 2010-11-03 DIAGNOSIS — N182 Chronic kidney disease, stage 2 (mild): Secondary | ICD-10-CM | POA: Insufficient documentation

## 2010-11-17 ENCOUNTER — Ambulatory Visit (INDEPENDENT_AMBULATORY_CARE_PROVIDER_SITE_OTHER): Payer: Medicare Other | Admitting: Cardiology

## 2010-11-17 ENCOUNTER — Encounter: Payer: Self-pay | Admitting: Cardiology

## 2010-11-17 VITALS — BP 124/64 | HR 88 | Ht 70.0 in | Wt 205.6 lb

## 2010-11-17 DIAGNOSIS — E785 Hyperlipidemia, unspecified: Secondary | ICD-10-CM

## 2010-11-17 DIAGNOSIS — I2510 Atherosclerotic heart disease of native coronary artery without angina pectoris: Secondary | ICD-10-CM

## 2010-11-17 NOTE — Progress Notes (Signed)
[name] [name] Date of Birth: October 16, 1940   History of Present Illness: [name] [name] is seen today for followup of his coronary disease. He has a history of an intermediate stenosis in the mid LAD had a 70% by angiography. Previous nuclear stress test in 2008 was normal. He underwent cardiac catheterization in 2010 with flow wire evaluation of the LAD which was normal. He has been managed medically. He does note that when he works too hard he gets short of breath and tires out easily. He really hasn't been exercising regularly for the past 6 months. He has been seeing a practitioner of a holistic therapies and has been subjected to some type of detoxification procedure but apparently this was not chelation therapy. It involved placing his feet in some type of bath that "drew out toxins".  Current Outpatient Prescriptions on File Prior to Visit  Medication Sig Dispense Refill  . Ascorbic Acid (VITAMIN C) 1000 MG tablet Take 1,000 mg by mouth daily.        Marland Kitchen aspirin 325 MG tablet Take 325 mg by mouth daily.        Marland Kitchen CALCIUM PO Take by mouth daily.        . furosemide (LASIX) 10 MG/ML solution Take 10 mg by mouth daily.        . Glucosamine HCl-Glucosamin SO4 (GLUCOSAMINE COMPLEX PO) Take by mouth daily.        . Misc Natural Products (OSTEO BI-FLEX JOINT SHIELD PO) Take by mouth.        . Multiple Vitamin (MULTIVITAMIN) tablet Take 1 tablet by mouth daily.        . niacin (NIASPAN) 1000 MG CR tablet Take 1,000 mg by mouth at bedtime.        . Omega-3 Fatty Acids (FISH OIL) 600 MG CAPS Take by mouth daily.        . Psyllium (METAMUCIL PO) Take by mouth daily.        . Red Yeast Rice 600 MG TABS Take by mouth daily.        . rosuvastatin (CRESTOR) 10 MG tablet Take 10 mg by mouth at bedtime. Taking 1/2 every other day      . Tamsulosin HCl (FLOMAX) 0.4 MG CAPS Take by mouth daily.        Marland Kitchen VALERIAN ROOT PO Take by mouth.        . DISCONTD: rosuvastatin (CRESTOR) 10 MG tablet Take 1 tablet (10 mg  total) by mouth at bedtime.  30 tablet  5    Allergies  Allergen Reactions  . Other     Intolerance to zetia and lipitor  . Sulfonamide Derivatives     Past Medical History  Diagnosis Date  . Coronary artery disease   . Obstructive sleep apnea   . Hyperlipidemia   . Chronic kidney disease     mild insuffiency    Past Surgical History  Procedure Date  . Cardiac catheterization 05/02/2003    single vessel,moderate stenosis mid LAD  . Cardiac catheterization 06/12/2008    continue med. therapy  . Right shoulder surgery     History  Smoking status  . Former Smoker  Smokeless tobacco  . Not on file    History  Alcohol Use     Family History  Problem Relation Age of Onset  . Cancer Mother   . Hypertension Mother   . Other Father     old age  . Hypertension Father  Review of Systems: The review of systems is positive for fatigue. Some dyspnea. All other systems were reviewed and are negative.  Physical Exam: BP 124/64  Pulse 88  Ht 5\' 10"  (1.778 m)  Wt 205 lb 9.6 oz (93.26 kg)  BMI 29.50 kg/m2 The patient is alert and oriented x 3.  The mood and affect are normal.  The skin is warm and dry.  Color is normal.  The HEENT exam reveals that the sclera are nonicteric.  The mucous membranes are moist.  The carotids are 2+ without bruits.  There is no thyromegaly.  There is no JVD.  The lungs are clear.  The chest wall is non tender.  The heart exam reveals a regular rate with a normal S1 and S2.  There are no murmurs, gallops, or rubs.  The PMI is not displaced.   Abdominal exam reveals good bowel sounds.  There is no guarding or rebound.  There is no hepatosplenomegaly or tenderness.  There are no masses.  Exam of the legs reveal no clubbing, cyanosis, or edema.  The legs are without rashes.  The distal pulses are intact.  Cranial nerves II - XII are intact.  Motor and sensory functions are intact.  The gait is normal.  LABORATORY DATA: Blood work reviewed from May of  2012. CBC and chemistries were normal. Urinalysis was normal. Total cholesterol 151, HDL 46, triglycerides 87, LDL 98. ECG today demonstrates normal sinus rhythm with nonspecific T-wave abnormality.  Assessment / Plan:

## 2010-11-17 NOTE — Patient Instructions (Signed)
You need to get back into an aerobic exercise routine.  Continue your current medications.  I will see you again in 6 months.  I will get a copy of your lab work from Dr. Collins Scotland.

## 2010-11-17 NOTE — Assessment & Plan Note (Signed)
Lipid parameters looked fairly reasonable. He has not able to tolerate higher doses of statins.

## 2010-11-17 NOTE — Assessment & Plan Note (Signed)
History of borderline stenosis in the mid LAD. Previous ischemic evaluation was negative. We will continue with risk factor modification. He does have some dyspnea on exertion but I think this is mostly related to deconditioning and I have recommended that he resume a regular aerobic exercise program. If his symptoms should progress we will need to consider a stress Myoview study.

## 2010-12-13 ENCOUNTER — Telehealth: Payer: Self-pay | Admitting: Cardiology

## 2010-12-13 NOTE — Telephone Encounter (Signed)
Pt calling wanting to know if it is ok for pt to eat grapefruit while taking crestor. Please return pt call to discuss further.

## 2010-12-13 NOTE — Telephone Encounter (Signed)
Called stating he saw where he should not eat grapefruit while taking Crestor. Advised that grapefruit is not advised with all the statins. Advised per Dr. Swaziland not to eat or drink grapefruits every day; could have occasionally. He does need to stay on Crestor. He verbalizes understanding.

## 2011-01-10 ENCOUNTER — Ambulatory Visit: Payer: Self-pay | Admitting: Internal Medicine

## 2011-01-24 ENCOUNTER — Ambulatory Visit (INDEPENDENT_AMBULATORY_CARE_PROVIDER_SITE_OTHER): Payer: Medicare Other | Admitting: Internal Medicine

## 2011-01-24 ENCOUNTER — Encounter: Payer: Self-pay | Admitting: Internal Medicine

## 2011-01-24 VITALS — BP 140/70 | HR 76 | Ht 70.0 in | Wt 208.0 lb

## 2011-01-24 DIAGNOSIS — G4733 Obstructive sleep apnea (adult) (pediatric): Secondary | ICD-10-CM

## 2011-01-24 NOTE — Assessment & Plan Note (Signed)
He is pleased with his sleep apnea control and desires no changes. Compliance is excellent.

## 2011-01-24 NOTE — Progress Notes (Signed)
01/24/11- 70 yoM former smoker followed for obstructive sleep apnea complicated by CAD, Chronic renal disease. LOV-01/17/11 Has had flu vaccine and shingles vaccine this year. He is very comfortable using CPAP all night every night at 10 CWP/[facility]. His download is being scanned. Nocturia wakes him 2 or 3 times a night. He is taking furosemide and we discussed sleep fragmentation from this.  ROS-see HPI Constitutional:   No-   weight loss, night sweats, fevers, chills, fatigue, lassitude. HEENT:   No-  headaches, difficulty swallowing, tooth/dental problems, sore throat,       No-  sneezing, itching, ear ache, nasal congestion, post nasal drip,  CV:  No-   chest pain, orthopnea, PND, swelling in lower extremities, anasarca,                                  dizziness, palpitations Resp: No-   shortness of breath with exertion or at rest.              No-   productive cough,  No non-productive cough,  No- coughing up of blood.              No-   change in color of mucus.  No- wheezing.   Skin: No-   rash or lesions. GI:  No-   heartburn, indigestion, abdominal pain, nausea, vomiting, diarrhea,                 change in bowel habits, loss of appetite GU: No-   dysuria, change in color of urine, no urgency or frequency.  No- flank pain. MS:  No-   joint pain or swelling.  No- decreased range of motion.  No- back pain. Neuro-     nothing unusual Psych:  No- change in mood or affect. No depression or anxiety.  No memory loss.  OBJ General- Alert, Oriented, Affect-appropriate, Distress- none acute Skin- rash-none, lesions- none, excoriation- none Lymphadenopathy- none Head- atraumatic            Eyes- Gross vision intact, PERRLA, Strabismus, conjunctivae clear secretions            Ears- Hearing, canals-normal            Nose- Clear, no-Septal dev, mucus, polyps, erosion, perforation             Throat- Mallampati IV , mucosa clear , drainage- none, tonsils- atrophic Neck- flexible ,  trachea midline, no stridor , thyroid nl, carotid no bruit Chest - symmetrical excursion , unlabored           Heart/CV- RRR , no murmur , no gallop  , no rub, nl s1 s2                           - JVD- none , edema- none, stasis changes- none, varices- none           Lung- clear to P&A, wheeze- none, cough- none , dullness-none, rub- none           Chest wall-  Abd- tender-no, distended-no, bowel sounds-present, HSM- no Br/ Gen/ Rectal- Not done, not indicated Extrem- cyanosis- none, clubbing, none, atrophy- none, strength- nl Neuro- grossly intact to observation

## 2011-01-24 NOTE — Patient Instructions (Signed)
CPAP is doing very well- I don't see the need to make changes now, but please call as needed.

## 2011-01-31 ENCOUNTER — Encounter: Payer: Self-pay | Admitting: Internal Medicine

## 2011-03-04 ENCOUNTER — Encounter: Payer: Self-pay | Admitting: Cardiology

## 2011-05-19 ENCOUNTER — Encounter: Payer: Self-pay | Admitting: Cardiology

## 2011-06-03 ENCOUNTER — Ambulatory Visit (INDEPENDENT_AMBULATORY_CARE_PROVIDER_SITE_OTHER): Payer: Medicare Other | Admitting: Cardiology

## 2011-06-03 ENCOUNTER — Encounter: Payer: Self-pay | Admitting: Cardiology

## 2011-06-03 VITALS — BP 134/78 | HR 70 | Ht 70.0 in | Wt 212.2 lb

## 2011-06-03 DIAGNOSIS — I2510 Atherosclerotic heart disease of native coronary artery without angina pectoris: Secondary | ICD-10-CM

## 2011-06-03 DIAGNOSIS — E785 Hyperlipidemia, unspecified: Secondary | ICD-10-CM

## 2011-06-03 NOTE — Assessment & Plan Note (Signed)
He will continue with low-dose statin therapy with niacin.

## 2011-06-03 NOTE — Progress Notes (Signed)
[name] [name] Date of Birth: Dec 06, 1940   History of Present Illness: [name] is seen today for followup of his coronary disease. He has a history of an intermediate stenosis in the mid LAD had a 70% by angiography. Previous nuclear stress test in 2008 was normal. He underwent cardiac catheterization in 2010 with flow wire evaluation of the LAD which was normal. He has been managed medically. He has had no significant change in his symptomatology. He still gets short of breath with exertion and feels fatigue. I think he is still having a difficult time adjusting to the loss of his wife. He has gained 6-7 pounds since his last visit.  Current Outpatient Prescriptions on File Prior to Visit  Medication Sig Dispense Refill  . Ascorbic Acid (VITAMIN C) 1000 MG tablet Take 1,000 mg by mouth daily.        Marland Kitchen aspirin 81 MG tablet Take 81 mg by mouth daily.        Marland Kitchen CALCIUM PO Take by mouth daily. With Vitamin D      . darifenacin (ENABLEX) 15 MG 24 hr tablet Take 15 mg by mouth daily.      . Glucosamine HCl-Glucosamin SO4 (GLUCOSAMINE COMPLEX PO) Take by mouth daily.        . Multiple Vitamin (MULTIVITAMIN) tablet Take 1 tablet by mouth daily.        . niacin (SLO-NIACIN) 500 MG tablet Take 1,000 mg by mouth at bedtime.        . Omega-3 Fatty Acids (FISH OIL) 600 MG CAPS Take by mouth daily.        . Psyllium (METAMUCIL PO) Take by mouth daily.        . Red Yeast Rice 600 MG TABS Take by mouth daily.        . rosuvastatin (CRESTOR) 10 MG tablet Take 10 mg by mouth at bedtime. Taking 1/2 every other day      . Tamsulosin HCl (FLOMAX) 0.4 MG CAPS Take by mouth daily.        Marland Kitchen VALERIAN ROOT PO Take by mouth as needed.       Marland Kitchen DISCONTD: rosuvastatin (CRESTOR) 10 MG tablet Take 1 tablet (10 mg total) by mouth at bedtime.  30 tablet  5    Allergies  Allergen Reactions  . Other     Intolerance to zetia and lipitor  . Sulfonamide Derivatives     Past Medical History  Diagnosis Date  .  Coronary artery disease   . Obstructive sleep apnea   . Hyperlipidemia   . Chronic kidney disease     mild insuffiency    Past Surgical History  Procedure Date  . Cardiac catheterization 05/02/2003    single vessel,moderate stenosis mid LAD  . Cardiac catheterization 06/12/2008    continue med. therapy  . Right shoulder surgery     History  Smoking status  . Former Smoker -- 40 years  . Types: Pipe  . Quit date: 02/28/1978  Smokeless tobacco  . Not on file  Comment: smoked pipe only     History  Alcohol Use: Not on file    Family History  Problem Relation Age of Onset  . Cancer Mother   . Hypertension Mother   . Other Father     old age  . Hypertension Father     Review of Systems: The review of systems is positive for fatigue. Some dyspnea. All other systems were reviewed and are negative.  Physical  Exam: BP 134/78  Pulse 70  Ht 5\' 10"  (1.778 m)  Wt 212 lb 3.2 oz (96.253 kg)  BMI 30.45 kg/m2 The patient is alert and oriented x 3.  The mood and affect are normal.  The skin is warm and dry.  Color is normal.  The HEENT exam reveals that the sclera are nonicteric.  The mucous membranes are moist.  The carotids are 2+ without bruits.  There is no thyromegaly.  There is no JVD.  The lungs are clear.  The chest wall is non tender.  The heart exam reveals a regular rate with a normal S1 and S2.  There are no murmurs, gallops, or rubs.  The PMI is not displaced.   Abdominal exam reveals good bowel sounds.  There is no guarding or rebound.  There is no hepatosplenomegaly or tenderness.  There are no masses.  Exam of the legs reveal no clubbing, cyanosis, or edema.  The legs are without rashes.  The distal pulses are intact.  Cranial nerves II - XII are intact.  Motor and sensory functions are intact.  The gait is normal.  LABORATORY DATA: Blood work reviewed from 05/20/2011 demonstrated normal chemistry panel and PSA. Total cholesterol 151, HDL 46, triglycerides 92, LDL  86.  Assessment / Plan:

## 2011-06-03 NOTE — Patient Instructions (Signed)
Continue your current medication  Increase your aerobic activity and lose weight  I will get a copy of your lab work from Dr. Collins Scotland.  I will see you again in 6 months.

## 2011-06-03 NOTE — Assessment & Plan Note (Signed)
He has a borderline stenosis in the LAD. Evaluation with nuclear stress testing and flow wire in the past have been unremarkable. We will continue with medical therapy and risk factor modification.

## 2011-10-20 ENCOUNTER — Telehealth: Payer: Self-pay | Admitting: Cardiology

## 2011-10-20 NOTE — Telephone Encounter (Signed)
Error

## 2011-10-25 ENCOUNTER — Telehealth: Payer: Self-pay | Admitting: Internal Medicine

## 2011-10-25 NOTE — Telephone Encounter (Signed)
Katie please advise if you can work pt in and if so when that way we only have to call pt once

## 2011-10-25 NOTE — Telephone Encounter (Signed)
LMTCB on home and cell number as requested.

## 2011-10-25 NOTE — Telephone Encounter (Signed)
Pt can come in on 11-04-11 at 11 30 am slot-pt to be here at 11 15 am. Thanks.

## 2011-10-26 ENCOUNTER — Telehealth: Payer: Self-pay | Admitting: *Deleted

## 2011-10-26 ENCOUNTER — Encounter: Payer: Self-pay | Admitting: Physician Assistant

## 2011-10-26 ENCOUNTER — Ambulatory Visit (INDEPENDENT_AMBULATORY_CARE_PROVIDER_SITE_OTHER): Payer: Medicare Other | Admitting: Physician Assistant

## 2011-10-26 VITALS — BP 138/80 | HR 75 | Ht 69.5 in | Wt 210.0 lb

## 2011-10-26 DIAGNOSIS — R5381 Other malaise: Secondary | ICD-10-CM

## 2011-10-26 DIAGNOSIS — I440 Atrioventricular block, first degree: Secondary | ICD-10-CM | POA: Insufficient documentation

## 2011-10-26 DIAGNOSIS — R0602 Shortness of breath: Secondary | ICD-10-CM

## 2011-10-26 DIAGNOSIS — I2510 Atherosclerotic heart disease of native coronary artery without angina pectoris: Secondary | ICD-10-CM

## 2011-10-26 DIAGNOSIS — R5383 Other fatigue: Secondary | ICD-10-CM

## 2011-10-26 DIAGNOSIS — E785 Hyperlipidemia, unspecified: Secondary | ICD-10-CM

## 2011-10-26 LAB — CBC WITH DIFFERENTIAL/PLATELET
Basophils Absolute: 0 10*3/uL (ref 0.0–0.1)
Basophils Relative: 0.7 % (ref 0.0–3.0)
Eosinophils Absolute: 0.1 10*3/uL (ref 0.0–0.7)
HCT: 40.4 % (ref 39.0–52.0)
Hemoglobin: 13.4 g/dL (ref 13.0–17.0)
Lymphocytes Relative: 27.9 % (ref 12.0–46.0)
Lymphs Abs: 1.3 10*3/uL (ref 0.7–4.0)
MCHC: 33.3 g/dL (ref 30.0–36.0)
MCV: 95.7 fl (ref 78.0–100.0)
Monocytes Absolute: 0.3 10*3/uL (ref 0.1–1.0)
Neutro Abs: 2.8 10*3/uL (ref 1.4–7.7)
RBC: 4.22 Mil/uL (ref 4.22–5.81)
RDW: 13.3 % (ref 11.5–14.6)

## 2011-10-26 LAB — BASIC METABOLIC PANEL
CO2: 25 mEq/L (ref 19–32)
Chloride: 106 mEq/L (ref 96–112)
Glucose, Bld: 106 mg/dL — ABNORMAL HIGH (ref 70–99)
Potassium: 4 mEq/L (ref 3.5–5.1)
Sodium: 139 mEq/L (ref 135–145)

## 2011-10-26 LAB — D-DIMER, QUANTITATIVE: D-Dimer, Quant: 0.26 ug/mL-FEU (ref 0.00–0.48)

## 2011-10-26 NOTE — Progress Notes (Signed)
[address] [place], [place]  [address] Phone: [phone] Fax:  [phone]  Date:  10/26/2011   Name:  [name] [name]   DOB:  05/29/1940   MRN:  [id number]  PCP:  Herb Grays, MD  Primary Cardiologist:  Dr. Peter Swaziland  Primary Electrophysiologist:  none   History of Present Illness: [name] [name] is a 71 y.o. male who returns for evaluation of dyspnea.  He has a history of CAD, mild renal insufficiency, HL, sleep apnea. Nuclear study in 2008 was normal. LHC 4/10: Mid LAD 40-50%, then 70%, OM1 20-30%, EF 65%. Mid LAD FFR 0.89 (not hemodynamically significant). Medical therapy was continued. Last seen by Dr. Swaziland 4/13  He has had episodic dyspnea, worse over the last 1 month. He notes it mainly with activity. He can sometimes get it at rest. It may last just a few seconds. He describes class II symptoms. He had an episode the other day while doing yoga. He denies chest discomfort, syncope, near syncope, orthopnea, PND. He has chronic pedal edema without significant change. He's had significant issues with depression. He was on antidepressants for a while. He also had problems with hypersomnolence. He was eventually referred to neurology. He is apparently getting a repeat sleep study to assess his treatment of sleep apnea. He does note rapid heartbeats with activity at times. Of note, he recently traveled by train to [place].  Wt Readings from Last 3 Encounters:  10/26/11 210 lb (95.255 kg)  06/03/11 212 lb 3.2 oz (96.253 kg)  01/24/11 208 lb (94.348 kg)     Past Medical History  Diagnosis Date  . Coronary artery disease     LHC 4/10: Mid LAD 40-50%, then 70%, OM1 20-30%, EF 65%. Mid LAD FFR 0.89 (not hemodynamically significant). Medical therapy was continued.  . Obstructive sleep apnea   . Hyperlipidemia   . Chronic kidney disease     mild insuffiency    Current Outpatient Prescriptions  Medication Sig Dispense Refill  . Ascorbic Acid  (VITAMIN C) 1000 MG tablet Take 1,000 mg by mouth daily.        Marland Kitchen aspirin 81 MG tablet Take 81 mg by mouth daily.        Marland Kitchen CALCIUM PO Take by mouth daily. With Vitamin D      . furosemide (LASIX) 20 MG tablet Take 20 mg by mouth daily.      . Glucosamine HCl-Glucosamin SO4 (GLUCOSAMINE COMPLEX PO) Take by mouth daily.        . Multiple Vitamin (MULTIVITAMIN) tablet Take 1 tablet by mouth daily.        . niacin (SLO-NIACIN) 500 MG tablet Take 1,000 mg by mouth at bedtime.        . Omega-3 Fatty Acids (FISH OIL) 600 MG CAPS Take by mouth daily.        . Psyllium (METAMUCIL PO) Take by mouth daily.        . rosuvastatin (CRESTOR) 10 MG tablet Take 10 mg by mouth as directed. TAKE 1/2 TABLET EVERY OTHER DAY      . Saw Palmetto, Serenoa repens, 1000 MG CAPS Take 1,000 capsules by mouth 3 (three) times daily.      . Tamsulosin HCl (FLOMAX) 0.4 MG CAPS Take by mouth daily.        Marland Kitchen VALERIAN ROOT PO Take by mouth as needed.         Allergies: Allergies  Allergen Reactions  . Enablex (Darifenacin Hydrobromide  Er)     PT STATES IT MAKES HIM "BONKERS" AND VERY TIRED  . Other     Intolerance to zetia and lipitor  . Sulfonamide Derivatives     History  Substance Use Topics  . Smoking status: Former Smoker -- 40 years    Types: Pipe    Quit date: 02/28/1978  . Smokeless tobacco: Not on file   Comment: smoked pipe only   . Alcohol Use: Not on file     ROS:  Please see the history of present illness.    All other systems reviewed and negative.   PHYSICAL EXAM: VS:  BP 138/80  Pulse 75  Ht 5' 9.5" (1.765 m)  Wt 210 lb (95.255 kg)  BMI 30.57 kg/m2 Well nourished, well developed, in no acute distress HEENT: normal Neck: no JVD Cardiac:  normal S1, S2; RRR; no murmur Lungs:  clear to auscultation bilaterally, no wheezing, rhonchi or rales Abd: soft, nontender, no hepatomegaly Ext: no edema Skin: warm and dry Neuro:  CNs 2-12 intact, no focal abnormalities noted  EKG:  Sinus rhythm,  heart rate 75, normal axis, nonspecific ST-T wave changes, first degree AV block with a PR interval 234 ms      ASSESSMENT AND PLAN:  1. Dyspnea: Etiology unclear. He has a history of CAD. He also has a history of recent travel by train to [place]. Question if his symptoms worsened after returning. To further evaluate, arrange ETT-Myoview to rule out significant ischemia. Check a basic metabolic panel, CBC, TSH, BNP and d-dimer. If his BNP is abnormal, I just Lasix and arrange formal echocardiogram. If his d-dimer as abnormal, consider chest CT versus VQ scan depending upon his renal function. Followup with Dr. Swaziland in one month.  2. Coronary Artery Disease: Continue aspirin and statin. Proceed with Myoview as noted.  3. First Degree AV Block: He is not on any AV nodal blocking agents. He denies any symptoms of syncope or near-syncope and he is not bradycardic. At this point, I do not believe that he needs an event monitor as he appears to be asymptomatic.  4. Hyperlipidemia: Continue statin.  Luna Glasgow, PA-C  12:39 PM 10/26/2011

## 2011-10-26 NOTE — Telephone Encounter (Signed)
Message copied by Tarri Fuller on Wed Oct 26, 2011  4:55 PM ------      Message from: [place], [place] T      Created: Wed Oct 26, 2011  3:38 PM       Negative DDimer.      Tereso Newcomer, PA-C  3:38 PM 10/26/2011

## 2011-10-26 NOTE — Telephone Encounter (Signed)
Patient returned call.  Informed patient of appt date and time.  Nothing else needed.  [facility]

## 2011-10-26 NOTE — Telephone Encounter (Signed)
lmom D-dimer negative

## 2011-10-26 NOTE — Patient Instructions (Addendum)
Your physician recommends that you continue on your current medications as directed. Please refer to the Current Medication list given to you today.  Your physician recommends that you keep your scheduled appointment  with Dr Thomasene Lot on October 4th   Your physician recommends that you have lab work today, bmet, cbc, tsh, bnp, ddimer  Your physician has requested that you have en exercise stress myoview. For further information please visit [email or web address] Please follow instruction sheet, as given. DX:  786.05,[phone]

## 2011-10-27 ENCOUNTER — Telehealth: Payer: Self-pay | Admitting: *Deleted

## 2011-10-27 NOTE — Telephone Encounter (Signed)
Follow-up: ° ° ° °Patient called in returning your call.  Please call back. °

## 2011-10-27 NOTE — Telephone Encounter (Signed)
lvm with pt's daughter for ptcb to go over lab results

## 2011-10-27 NOTE — Telephone Encounter (Signed)
Message copied by Tarri Fuller on Thu Oct 27, 2011 11:40 AM ------      Message from: [place], [place] T      Created: Wed Oct 26, 2011 10:11 PM       Labs ok      No CHF      Continue with current treatment plan.      Tereso Newcomer, PA-C  10:11 PM 10/26/2011

## 2011-10-27 NOTE — Telephone Encounter (Signed)
pt notified of lab results with verbal understanding today 

## 2011-10-27 NOTE — Telephone Encounter (Signed)
Message copied by Tarri Fuller on Thu Oct 27, 2011  4:06 PM ------      Message from: [place], [place] T      Created: Wed Oct 26, 2011 10:11 PM       Labs ok      No CHF      Continue with current treatment plan.      Tereso Newcomer, PA-C  10:11 PM 10/26/2011

## 2011-11-03 ENCOUNTER — Ambulatory Visit (HOSPITAL_COMMUNITY): Payer: Medicare Other | Attending: Cardiology | Admitting: Radiology

## 2011-11-03 VITALS — BP 132/75 | Ht 69.0 in | Wt 210.0 lb

## 2011-11-03 DIAGNOSIS — Z87891 Personal history of nicotine dependence: Secondary | ICD-10-CM | POA: Insufficient documentation

## 2011-11-03 DIAGNOSIS — R5381 Other malaise: Secondary | ICD-10-CM | POA: Insufficient documentation

## 2011-11-03 DIAGNOSIS — R0602 Shortness of breath: Secondary | ICD-10-CM

## 2011-11-03 DIAGNOSIS — R002 Palpitations: Secondary | ICD-10-CM | POA: Insufficient documentation

## 2011-11-03 DIAGNOSIS — R5383 Other fatigue: Secondary | ICD-10-CM

## 2011-11-03 DIAGNOSIS — R0989 Other specified symptoms and signs involving the circulatory and respiratory systems: Secondary | ICD-10-CM | POA: Insufficient documentation

## 2011-11-03 DIAGNOSIS — R0609 Other forms of dyspnea: Secondary | ICD-10-CM | POA: Insufficient documentation

## 2011-11-03 DIAGNOSIS — I2510 Atherosclerotic heart disease of native coronary artery without angina pectoris: Secondary | ICD-10-CM

## 2011-11-03 MED ORDER — TECHNETIUM TC 99M TETROFOSMIN IV KIT
30.0000 | PACK | Freq: Once | INTRAVENOUS | Status: AC | PRN
  Administered 2011-11-03: 30 via INTRAVENOUS

## 2011-11-03 MED ORDER — TECHNETIUM TC 99M TETROFOSMIN IV KIT
10.0000 | PACK | Freq: Once | INTRAVENOUS | Status: AC | PRN
  Administered 2011-11-03: 10 via INTRAVENOUS

## 2011-11-03 NOTE — Progress Notes (Addendum)
[facility] SITE 3 NUCLEAR MED [address] [place] [address] [phone]  Cardiology Nuclear Med Study  [name] [name] is a 71 y.o. male     MRN : [id number]     DOB: [birth year]  Procedure Date: 11/03/2011  Nuclear Med Background Indication for Stress Test:  Evaluation for Ischemia and Abnormal EKG History:  '08 MPS: NL '10 Heart Cath: EF: 65% mod stenosis in LAD Tx RX OMI 20-30% Cardiac Risk Factors: History of Smoking and Lipids  Symptoms:  DOE, Fatigue, Palpitations and SOB   Nuclear Pre-Procedure Caffeine/Decaff Intake:  None NPO After: 7:30am   Lungs:  clear O2 Sat: 95% on room air. IV 0.9% NS with Angio Cath:  20g  IV Site: R Antecubital  IV Started by:  Stanton Kidney, EMT-P  Chest Size (in):  44 Cup Size: n/a  Height: 5\' 9"  (1.753 m)  Weight:  210 lb (95.255 kg)  BMI:  Body mass index is 31.01 kg/(m^2). Tech Comments:  NA    Nuclear Med Study 1 or 2 day study: 1 day  Stress Test Type:  Stress  Reading MD: Cassell Clement, MD  Order Authorizing Provider:  P.Jordan MD S.Weaver PA  Resting Radionuclide: Technetium 53m Tetrofosmin  Resting Radionuclide Dose: 11.0 mCi   Stress Radionuclide:  Technetium 53m Tetrofosmin  Stress Radionuclide Dose: 33.0 mCi           Stress Protocol Rest HR: 53 Stress HR: 131  Rest BP: 132/75 Stress BP: 195/76  Exercise Time (min): 7:00 METS: 8.5   Predicted Max HR: 147 bpm % Max HR: 89.12 bpm Rate Pressure Product: 57846   Dose of Adenosine (mg):  n/a Dose of Lexiscan: n/a mg  Dose of Atropine (mg): n/a Dose of Dobutamine: n/a mcg/kg/min (at max HR)  Stress Test Technologist: Milana Na, EMT-P  Nuclear Technologist:  Domenic Polite, CNMT     Rest Procedure:  Myocardial perfusion imaging was performed at rest 45 minutes following the intravenous administration of Technetium 5m Tetrofosmin. Rest ECG: Sinus Bradycardia with 1st degree AVB  Stress Procedure:  The patient performed treadmill exercise  using a Bruce  Protocol for 7:00 minutes. The patient stopped due to sob, fatigue and denied any chest pain.  There were + significant ST-T wave changes and a rare pac.  Technetium 33m Tetrofosmin was injected at peak exercise and myocardial perfusion imaging was performed after a brief delay. Stress ECG: No significant change from baseline ECG  QPS Raw Data Images:  Normal; no motion artifact; normal heart/lung ratio. Stress Images:  Normal homogeneous uptake in all areas of the myocardium. Rest Images:  Normal homogeneous uptake in all areas of the myocardium. Subtraction (SDS):  No evidence of ischemia. Transient Ischemic Dilatation (Normal <1.22):  1.04 Lung/Heart Ratio (Normal <0.45):  0.29  Quantitative Gated Spect Images QGS EDV:  72 ml QGS ESV:  19 ml  Impression Exercise Capacity:  Good exercise capacity. BP Response:  Normal blood pressure response. Clinical Symptoms:  No chest pain. Patient was dyspneic. ECG Impression:  Significant ST abnormalities consistent with ischemia. Comparison with Prior Nuclear Study: No images to compare  Overall Impression:  Low risk stress nuclear study. There are no perfusion abnormalities. EKG changes during exercise appear to represent "false positive".  LV Ejection Fraction: 74%.  LV Wall Motion:  NL LV Function; NL Wall Motion   [employer]

## 2011-11-04 ENCOUNTER — Encounter: Payer: Self-pay | Admitting: Internal Medicine

## 2011-11-04 ENCOUNTER — Ambulatory Visit (INDEPENDENT_AMBULATORY_CARE_PROVIDER_SITE_OTHER)
Admission: RE | Admit: 2011-11-04 | Discharge: 2011-11-04 | Disposition: A | Discharge status: Home / Self Care | Payer: Medicare Other | Source: Ambulatory Visit | Attending: Internal Medicine | Admitting: Internal Medicine

## 2011-11-04 ENCOUNTER — Ambulatory Visit (INDEPENDENT_AMBULATORY_CARE_PROVIDER_SITE_OTHER): Payer: Medicare Other | Admitting: Internal Medicine

## 2011-11-04 ENCOUNTER — Telehealth: Payer: Self-pay | Admitting: *Deleted

## 2011-11-04 VITALS — BP 110/66 | HR 75 | Ht 70.0 in | Wt 210.2 lb

## 2011-11-04 DIAGNOSIS — R0600 Dyspnea, unspecified: Secondary | ICD-10-CM

## 2011-11-04 DIAGNOSIS — R0609 Other forms of dyspnea: Secondary | ICD-10-CM

## 2011-11-04 DIAGNOSIS — Z23 Encounter for immunization: Secondary | ICD-10-CM

## 2011-11-04 DIAGNOSIS — R0989 Other specified symptoms and signs involving the circulatory and respiratory systems: Secondary | ICD-10-CM

## 2011-11-04 DIAGNOSIS — G4733 Obstructive sleep apnea (adult) (pediatric): Secondary | ICD-10-CM

## 2011-11-04 NOTE — Patient Instructions (Addendum)
Order- CXR  Dx dyspnea  Flu vax  Order- Schedule PFT   Pay close attention to the sleepiness issue. You might try an otc caffeine tab like the NODOZ caplets- 1/2 or 1  Very occasionally if needed  [facility] to a Medicare participating CPAP supplier. He uses 10 cwp, humidifier, mask of choice dx OSA

## 2011-11-04 NOTE — Telephone Encounter (Signed)
Message copied by Tarri Fuller on Fri Nov 04, 2011 10:39 AM ------      Message from: [place], [place] T      Created: Fri Nov 04, 2011  9:59 AM       Please inform patient stress test normal.      Tereso Newcomer, PA-C  9:59 AM 11/04/2011
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
## 2011-11-04 NOTE — Telephone Encounter (Signed)
pt notified about stress test results w/verbal understanding today, wants to know why he still has SOB verified his appt with Dr. Maple Hudson today @ 11:30 Pulmonolgy

## 2011-11-07 ENCOUNTER — Other Ambulatory Visit: Payer: Self-pay | Admitting: Cardiology

## 2011-11-09 ENCOUNTER — Encounter: Payer: Self-pay | Admitting: Internal Medicine

## 2011-11-12 DIAGNOSIS — R0609 Other forms of dyspnea: Secondary | ICD-10-CM | POA: Insufficient documentation

## 2011-11-12 DIAGNOSIS — R0600 Dyspnea, unspecified: Secondary | ICD-10-CM | POA: Insufficient documentation

## 2011-11-12 NOTE — Assessment & Plan Note (Signed)
Good compliance and control by download. He may the right the daytime sleepiness is from medication but we reviewed good sleep hygiene and proper CPAP control for our discussion today.

## 2011-11-12 NOTE — Assessment & Plan Note (Signed)
Plan-chest x-ray, PFT, flu vaccine

## 2011-12-02 ENCOUNTER — Ambulatory Visit: Payer: Medicare Other | Admitting: Cardiology

## 2011-12-16 ENCOUNTER — Ambulatory Visit (INDEPENDENT_AMBULATORY_CARE_PROVIDER_SITE_OTHER): Payer: Medicare Other | Admitting: Internal Medicine

## 2011-12-16 ENCOUNTER — Encounter (INDEPENDENT_AMBULATORY_CARE_PROVIDER_SITE_OTHER): Payer: Medicare Other

## 2011-12-16 ENCOUNTER — Encounter: Payer: Self-pay | Admitting: Internal Medicine

## 2011-12-16 VITALS — BP 120/76 | HR 76 | Ht 70.0 in | Wt 210.0 lb

## 2011-12-16 DIAGNOSIS — R0600 Dyspnea, unspecified: Secondary | ICD-10-CM

## 2011-12-16 DIAGNOSIS — R0609 Other forms of dyspnea: Secondary | ICD-10-CM

## 2011-12-16 DIAGNOSIS — R0602 Shortness of breath: Secondary | ICD-10-CM

## 2011-12-16 DIAGNOSIS — R0989 Other specified symptoms and signs involving the circulatory and respiratory systems: Secondary | ICD-10-CM

## 2011-12-16 DIAGNOSIS — G4733 Obstructive sleep apnea (adult) (pediatric): Secondary | ICD-10-CM

## 2011-12-16 LAB — PULMONARY FUNCTION TEST
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
## 2011-12-16 NOTE — Patient Instructions (Addendum)
Your pulmonary function scores look very good  I agree that weight loss and more regular exercise may be the right approach.  Suggest you try to get more sleep at night. 6 hours of sleep- 1130 till 530, may not be enough  Ask Dr Collins Scotland about nonsedating or alerting antidepressant therapy, rather than something sedating that makes you feel sleepier.

## 2011-12-19 ENCOUNTER — Ambulatory Visit: Payer: Medicare Other | Admitting: Cardiology

## 2011-12-24 NOTE — Assessment & Plan Note (Signed)
Pulmonary function tests are normal and he is not anemic. He is being watched for cardiovascular disease. I think most of his exertional dyspnea reflects obesity and deconditioning.

## 2011-12-24 NOTE — Assessment & Plan Note (Signed)
Good compliance and control with no changes needed. Weight loss would help.

## 2011-12-25 ENCOUNTER — Emergency Department (HOSPITAL_COMMUNITY)
Admission: EM | Admit: 2011-12-25 | Discharge: 2011-12-25 | Disposition: A | Discharge status: Home / Self Care | Payer: Medicare Other | Attending: Emergency Medicine | Admitting: Emergency Medicine

## 2011-12-25 ENCOUNTER — Emergency Department (HOSPITAL_COMMUNITY): Payer: Medicare Other

## 2011-12-25 ENCOUNTER — Encounter (HOSPITAL_COMMUNITY): Payer: Self-pay | Admitting: Emergency Medicine

## 2011-12-25 DIAGNOSIS — G5600 Carpal tunnel syndrome, unspecified upper limb: Secondary | ICD-10-CM | POA: Insufficient documentation

## 2011-12-25 DIAGNOSIS — I2510 Atherosclerotic heart disease of native coronary artery without angina pectoris: Secondary | ICD-10-CM | POA: Insufficient documentation

## 2011-12-25 DIAGNOSIS — Z87891 Personal history of nicotine dependence: Secondary | ICD-10-CM | POA: Insufficient documentation

## 2011-12-25 DIAGNOSIS — Z9889 Other specified postprocedural states: Secondary | ICD-10-CM | POA: Insufficient documentation

## 2011-12-25 DIAGNOSIS — N189 Chronic kidney disease, unspecified: Secondary | ICD-10-CM | POA: Insufficient documentation

## 2011-12-25 DIAGNOSIS — Z79899 Other long term (current) drug therapy: Secondary | ICD-10-CM | POA: Insufficient documentation

## 2011-12-25 DIAGNOSIS — G5602 Carpal tunnel syndrome, left upper limb: Secondary | ICD-10-CM

## 2011-12-25 DIAGNOSIS — G4733 Obstructive sleep apnea (adult) (pediatric): Secondary | ICD-10-CM | POA: Insufficient documentation

## 2011-12-25 DIAGNOSIS — E785 Hyperlipidemia, unspecified: Secondary | ICD-10-CM | POA: Insufficient documentation

## 2011-12-25 DIAGNOSIS — Z7982 Long term (current) use of aspirin: Secondary | ICD-10-CM | POA: Insufficient documentation

## 2011-12-25 NOTE — ED Provider Notes (Signed)
History     CSN: [id number]  Arrival date & time 12/25/11  2143   First MD Initiated Contact with Patient 12/25/11 2151      Chief Complaint  Patient presents with  . Hand Pain    (Consider location/radiation/quality/duration/timing/severity/associated sxs/prior treatment) HPI Comments: This is a 71 year old male, who presents to the emergency department with chief complaint of left hand pain. Patient states the hand pain began this morning. He has tried peppermint oil, and other homeopathic remedies for the pain, with no relief. Patient states the pain radiates into his wrist. Patient rates his pain as 5/6.  The history is provided by the patient. No language interpreter was used.    Past Medical History  Diagnosis Date  . Coronary artery disease     LHC 4/10: Mid LAD 40-50%, then 70%, OM1 20-30%, EF 65%. Mid LAD FFR 0.89 (not hemodynamically significant). Medical therapy was continued.  . Obstructive sleep apnea   . Hyperlipidemia   . Chronic kidney disease     mild insuffiency    Past Surgical History  Procedure Date  . Cardiac catheterization 05/02/2003    single vessel,moderate stenosis mid LAD  . Cardiac catheterization 06/12/2008    continue med. therapy  . Right shoulder surgery     Family History  Problem Relation Age of Onset  . Cancer Mother   . Hypertension Mother   . Other Father     old age  . Hypertension Father     History  Substance Use Topics  . Smoking status: Former Smoker -- 40 years    Types: Pipe    Quit date: 02/28/1978  . Smokeless tobacco: Not on file   Comment: smoked pipe only   . Alcohol Use: Not on file      Review of Systems  Musculoskeletal:       Left hand pain  All other systems reviewed and are negative.    Allergies  Enablex; Other; and Sulfonamide derivatives  Home Medications   Current Outpatient Rx  Name Route Sig Dispense Refill  . VITAMIN C 1000 MG PO TABS Oral Take 1,000 mg by mouth daily.      .  ASPIRIN EC 81 MG PO TBEC Oral Take 81 mg by mouth daily.    Marland Kitchen CALCIUM CARBONATE-VITAMIN D 500-200 MG-UNIT PO TABS Oral Take 1 tablet by mouth 2 (two) times daily.    . FUROSEMIDE 20 MG PO TABS Oral Take 20 mg by mouth daily.    Marland Kitchen GLUCOSAMINE COMPLEX PO Oral Take by mouth daily.      Marland Kitchen ONE-DAILY MULTI VITAMINS PO TABS Oral Take 1 tablet by mouth daily.      Marland Kitchen NIACIN ER 500 MG PO TBCR Oral Take 500 mg by mouth 2 (two) times daily.     Marland Kitchen FISH OIL 600 MG PO CAPS Oral Take 1 capsule by mouth 2 (two) times daily.     Marland Kitchen METAMUCIL PO Oral Take by mouth daily.      Marland Kitchen ROSUVASTATIN CALCIUM 10 MG PO TABS Oral Take 5 mg by mouth every other day.     . SAW PALMETTO 450 MG PO CAPS Oral Take 1 capsule by mouth 3 (three) times daily.    Marland Kitchen TAMSULOSIN HCL 0.4 MG PO CAPS Oral Take by mouth daily.      Marland Kitchen VALERIAN ROOT PO Oral Take 1 tablet by mouth at bedtime as needed. For insomnia      BP 145/77  Pulse 76  Temp  98.6 F (37 C) (Oral)  Resp 16  SpO2 99%  Physical Exam  Nursing note and vitals reviewed. Constitutional: He is oriented to person, place, and time. He appears well-developed and well-nourished.  HENT:  Head: Normocephalic and atraumatic.  Eyes: Conjunctivae normal and EOM are normal. Pupils are equal, round, and reactive to light.  Neck: Normal range of motion. Neck supple.  Cardiovascular: Normal rate, regular rhythm and normal heart sounds.   Pulmonary/Chest: Effort normal and breath sounds normal.  Abdominal: Soft. Bowel sounds are normal.  Musculoskeletal: Normal range of motion. He exhibits no edema and no tenderness.       Positive Tinel and Phalen sign, brisk capillary refill, strong pulses  Neurological: He is alert and oriented to person, place, and time.  Skin: Skin is warm and dry.  Psychiatric: He has a normal mood and affect. His behavior is normal. Judgment and thought content normal.    ED Course  Procedures (including critical care time)  Results for orders placed in  visit on 10/26/11  D-DIMER, QUANTITATIVE      Component Value Range   D-Dimer, Quant 0.26  0.00 - 0.48 ug/mL-FEU  BASIC METABOLIC PANEL      Component Value Range   Sodium 139  135 - 145 mEq/L   Potassium 4.0  3.5 - 5.1 mEq/L   Chloride 106  96 - 112 mEq/L   CO2 25  19 - 32 mEq/L   Glucose, Bld 106 (*) 70 - 99 mg/dL   BUN 17  6 - 23 mg/dL   Creatinine, Ser 1.1  0.4 - 1.5 mg/dL   Calcium 9.7  8.4 - 16.1 mg/dL   GFR 09.60  >45.40 mL/min  CBC WITH DIFFERENTIAL      Component Value Range   WBC 4.5  4.5 - 10.5 K/uL   RBC 4.22  4.22 - 5.81 Mil/uL   Hemoglobin 13.4  13.0 - 17.0 g/dL   HCT 98.1  19.1 - 47.8 %   MCV 95.7  78.0 - 100.0 fl   MCHC 33.3  30.0 - 36.0 g/dL   RDW 29.5  62.1 - 30.8 %   Platelets 183.0  150.0 - 400.0 K/uL   Neutrophils Relative 61.9  43.0 - 77.0 %   Lymphocytes Relative 27.9  12.0 - 46.0 %   Monocytes Relative 7.4  3.0 - 12.0 %   Eosinophils Relative 2.1  0.0 - 5.0 %   Basophils Relative 0.7  0.0 - 3.0 %   Neutro Abs 2.8  1.4 - 7.7 K/uL   Lymphs Abs 1.3  0.7 - 4.0 K/uL   Monocytes Absolute 0.3  0.1 - 1.0 K/uL   Eosinophils Absolute 0.1  0.0 - 0.7 K/uL   Basophils Absolute 0.0  0.0 - 0.1 K/uL  TSH      Component Value Range   TSH 1.40  0.35 - 5.50 uIU/mL  BRAIN NATRIURETIC PEPTIDE      Component Value Range   Pro B Natriuretic peptide (BNP) 70.0  0.0 - 100.0 pg/mL   Dg Hand Complete Left  12/25/2011  *RADIOLOGY REPORT*  Clinical Data: Pain in the web space between the thumb and index finger.  No known injury.  LEFT HAND - COMPLETE 3+ VIEW  Comparison: None.  Findings: Soft tissue structures appear normal.  The patient has multifocal degenerative change appearing worst at the first [facility] joint.  Old ulnar styloid fracture versus accessory ossicle is noted. There is no acute fracture or dislocation.  IMPRESSION:  1.  No acute finding or focal abnormality. 2.  Scattered degenerative disease appears worst at the first [facility] joint. 3.  Old ulnar styloid fracture  versus accessory ossicle.   Original Report Authenticated By: Bernadene Bell. D'ALESSIO, M.D.       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 I suspect that this is carpal tunnel based on his physical exam and positive Tinel and Phalen sign, I am going to discharge the patient with a cock-up splint and instructions to follow-up with PCP.  The patient is agreeable to this.  Patient is stable and ready for discharge.        Roxy Horseman, PA-C 12/25/11 2303

## 2011-12-25 NOTE — ED Notes (Signed)
Pt present to the ED with L hand pain x1 day.  No deformity or injury noted.  Pt NAD.

## 2011-12-25 NOTE — ED Notes (Signed)
Ortho tech notified regarding splint order.

## 2011-12-25 NOTE — ED Notes (Signed)
Denies chest pain 

## 2012-01-06 NOTE — ED Provider Notes (Signed)
Medical screening examination/treatment/procedure(s) were performed by non-physician practitioner and as supervising physician I was immediately available for consultation/collaboration.  Adem Costlow T Jaielle Dlouhy, MD 01/06/12 0759 

## 2012-01-12 ENCOUNTER — Ambulatory Visit: Payer: Medicare Other | Admitting: Internal Medicine

## 2012-05-15 ENCOUNTER — Telehealth: Payer: Self-pay

## 2012-05-15 NOTE — Telephone Encounter (Signed)
Patient called spoke to daughter was told received lab work from [facility] reviewing patient's chart patient is past due for appointment with Dr.Jordan.Daughter stated her father is in process of making appointment with Dr.Jordan and he will be calling back to schedule,did not want to schedule at this time.

## 2012-07-19 ENCOUNTER — Ambulatory Visit (INDEPENDENT_AMBULATORY_CARE_PROVIDER_SITE_OTHER): Payer: Medicare Other | Admitting: Cardiology

## 2012-07-19 ENCOUNTER — Encounter: Payer: Self-pay | Admitting: Cardiology

## 2012-07-19 VITALS — BP 152/90 | HR 90 | Ht 70.0 in | Wt 216.4 lb

## 2012-07-19 DIAGNOSIS — I2510 Atherosclerotic heart disease of native coronary artery without angina pectoris: Secondary | ICD-10-CM

## 2012-07-19 DIAGNOSIS — R0609 Other forms of dyspnea: Secondary | ICD-10-CM

## 2012-07-19 DIAGNOSIS — R0989 Other specified symptoms and signs involving the circulatory and respiratory systems: Secondary | ICD-10-CM

## 2012-07-19 DIAGNOSIS — E785 Hyperlipidemia, unspecified: Secondary | ICD-10-CM

## 2012-07-19 NOTE — Progress Notes (Signed)
[name] [name] Date of Birth: 1940/07/20   History of Present Illness: [name] [name] is seen today for followup of his coronary disease. He has a history of an intermediate stenosis in the mid LAD had a 70% by angiography. Previous nuclear stress test in 2008 was normal. He underwent cardiac catheterization in 2010 with flow wire evaluation of the LAD which was normal. He has been managed medically. He has had no significant change in his symptomatology. He is dealing with a lot of stress at home related to his daughter who is ill. As result he has had to do more of the housework as well as take care of his property. He is planning to join the [employer] and try to get more exercise.  Current Outpatient Prescriptions on File Prior to Visit  Medication Sig Dispense Refill  . Ascorbic Acid (VITAMIN C) 1000 MG tablet Take 1,000 mg by mouth daily.        Marland Kitchen aspirin EC 81 MG tablet Take 81 mg by mouth daily.      . calcium-vitamin D (OSCAL WITH D) 500-200 MG-UNIT per tablet Take 1 tablet by mouth 2 (two) times daily.      . furosemide (LASIX) 20 MG tablet Take 20 mg by mouth daily.      . Glucosamine HCl-Glucosamin SO4 (GLUCOSAMINE COMPLEX PO) Take by mouth daily.        . Multiple Vitamin (MULTIVITAMIN) tablet Take 1 tablet by mouth daily.        . niacin (SLO-NIACIN) 500 MG tablet Take 500 mg by mouth 2 (two) times daily.       . Omega-3 Fatty Acids (FISH OIL) 600 MG CAPS Take 1 capsule by mouth 2 (two) times daily.       . Psyllium (METAMUCIL PO) Take by mouth daily.        . rosuvastatin (CRESTOR) 10 MG tablet Take 5 mg by mouth every other day.       . Saw Palmetto 450 MG CAPS Take 1 capsule by mouth 3 (three) times daily.      . Tamsulosin HCl (FLOMAX) 0.4 MG CAPS Take by mouth daily.        Marland Kitchen VALERIAN ROOT PO Take 1 tablet by mouth at bedtime as needed. For insomnia       No current facility-administered medications on file prior to visit.    Allergies  Allergen Reactions  . Enablex (Darifenacin  Hydrobromide Er)     PT STATES IT MAKES HIM "BONKERS" AND VERY TIRED  . Other     Intolerance to zetia and lipitor  . Sulfonamide Derivatives     Past Medical History  Diagnosis Date  . Coronary artery disease     LHC 4/10: Mid LAD 40-50%, then 70%, OM1 20-30%, EF 65%. Mid LAD FFR 0.89 (not hemodynamically significant). Medical therapy was continued.  . Obstructive sleep apnea   . Hyperlipidemia   . Chronic kidney disease     mild insuffiency    Past Surgical History  Procedure Laterality Date  . Cardiac catheterization  05/02/2003    single vessel,moderate stenosis mid LAD  . Cardiac catheterization  06/12/2008    continue med. therapy  . Right shoulder surgery      History  Smoking status  . Former Smoker -- 40 years  . Types: Pipe  . Quit date: 02/28/1978  Smokeless tobacco  . Not on file    Comment: smoked pipe only     History  Alcohol  Use: Not on file    Family History  Problem Relation Age of Onset  . Cancer Mother   . Hypertension Mother   . Other Father     old age  . Hypertension Father     Review of Systems: The review of systems is positive for fatigue. Some dyspnea. All other systems were reviewed and are negative.  Physical Exam: BP 152/90  Pulse 90  Ht 5\' 10"  (1.778 m)  Wt 216 lb 6.4 oz (98.158 kg)  BMI 31.05 kg/m2  SpO2 97% The patient is alert and oriented x 3.  The mood and affect are normal.  The skin is warm and dry.  Color is normal.  The HEENT exam  is normal.  The carotids are 2+ without bruits.  There is no thyromegaly.  There is no JVD.  The lungs are clear.   The heart exam reveals a regular rate with a normal S1 and S2.  There are no murmurs, gallops, or rubs.  The PMI is not displaced.   Abdominal exam reveals good bowel sounds.  There is no guarding or rebound.  There is no hepatosplenomegaly or tenderness.  There are no masses.  Exam of the legs reveal no clubbing, cyanosis, or edema.  The legs are without rashes.  The distal  pulses are intact.  Cranial nerves II - XII are intact.  Motor and sensory functions are intact.  The gait is normal.  LABORATORY DATA:   Assessment / Plan:  1. Coronary disease with borderline LAD stenosis. Negative ischemic evaluation the past. We will continue medical management. I'll followup in 6 months.  2. Hyperlipidemia. I requested a copy of his most recent lab work to review.

## 2012-07-19 NOTE — Patient Instructions (Signed)
Continue your current medication  We will get your lab work from Dr. Collins [place].  I will see you in 6 months.

## 2012-12-17 ENCOUNTER — Ambulatory Visit (INDEPENDENT_AMBULATORY_CARE_PROVIDER_SITE_OTHER): Payer: Medicare Other | Admitting: Internal Medicine

## 2012-12-17 ENCOUNTER — Encounter: Payer: Self-pay | Admitting: Internal Medicine

## 2012-12-17 VITALS — BP 122/80 | HR 81 | Ht 69.5 in | Wt 215.8 lb

## 2012-12-17 DIAGNOSIS — G4733 Obstructive sleep apnea (adult) (pediatric): Secondary | ICD-10-CM

## 2012-12-17 DIAGNOSIS — Z23 Encounter for immunization: Secondary | ICD-10-CM

## 2012-12-17 NOTE — Patient Instructions (Signed)
Flu vax- hi dose  Order- DME [facility]    Replacement CPAP machine 10 cwp, mask of choice, humidifier, supplies   Dx OSA  Please call as needed
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
## 2013-01-01 NOTE — Assessment & Plan Note (Signed)
Good compliance and control Plan- script for replacement CPAP mask and supplies

## 2013-01-16 ENCOUNTER — Ambulatory Visit: Payer: Medicare Other | Admitting: Cardiology

## 2013-03-07 ENCOUNTER — Other Ambulatory Visit: Payer: Self-pay

## 2013-03-07 MED ORDER — ROSUVASTATIN CALCIUM 10 MG PO TABS: 5.0000 mg | ORAL_TABLET | ORAL | Status: DC

## 2013-03-08 ENCOUNTER — Encounter (INDEPENDENT_AMBULATORY_CARE_PROVIDER_SITE_OTHER): Payer: Self-pay

## 2013-03-08 ENCOUNTER — Ambulatory Visit (INDEPENDENT_AMBULATORY_CARE_PROVIDER_SITE_OTHER): Payer: Medicare Other | Admitting: Cardiology

## 2013-03-08 ENCOUNTER — Encounter: Payer: Self-pay | Admitting: Cardiology

## 2013-03-08 VITALS — BP 150/84 | HR 74 | Ht 70.0 in | Wt 215.5 lb

## 2013-03-08 DIAGNOSIS — E785 Hyperlipidemia, unspecified: Secondary | ICD-10-CM

## 2013-03-08 DIAGNOSIS — I2510 Atherosclerotic heart disease of native coronary artery without angina pectoris: Secondary | ICD-10-CM

## 2013-03-08 DIAGNOSIS — I440 Atrioventricular block, first degree: Secondary | ICD-10-CM

## 2013-03-08 NOTE — Progress Notes (Signed)
[name] [name] Date of Birth: Mar 21, 1940   History of Present Illness: [name] [name] is seen today for followup of his coronary disease. He has a history of an intermediate stenosis in the mid LAD had a 70% by angiography. Previous nuclear stress test in 2008 was normal. He underwent cardiac catheterization in 2010 with flow wire evaluation of the LAD which was normal. He has been managed medically. He has had no significant change in his symptomatology.  He primarily complains of a lack of energy. 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 He exercises sporadically. No chest pain or SOB.  Current Outpatient Prescriptions on File Prior to Visit  Medication Sig Dispense Refill  . Ascorbic Acid (VITAMIN C) 1000 MG tablet Take 1,000 mg by mouth daily.        Marland Kitchen aspirin EC 81 MG tablet Take 81 mg by mouth daily.      . calcium-vitamin D (OSCAL WITH D) 500-200 MG-UNIT per tablet Take 1 tablet by mouth 2 (two) times daily.      . furosemide (LASIX) 20 MG tablet Take 20 mg by mouth daily.      . Glucosamine HCl-Glucosamin SO4 (GLUCOSAMINE COMPLEX PO) Take by mouth daily.        . Multiple Vitamin (MULTIVITAMIN) tablet Take 1 tablet by mouth daily.        . niacin (SLO-NIACIN) 500 MG tablet Take 500 mg by mouth 2 (two) times daily.       . Omega-3 Fatty Acids (FISH OIL) 600 MG CAPS Take 1 capsule by mouth 2 (two) times daily.       Marland Kitchen OVER THE COUNTER MEDICATION G A M A (gets at [facility]) take 1 daily up to 5 times daily      . Psyllium (METAMUCIL PO) Take by mouth daily.        . rosuvastatin (CRESTOR) 10 MG tablet Take 0.5 tablets (5 mg total) by mouth every other day.  30 tablet  6  . Saw Palmetto 450 MG CAPS Take 1 capsule by mouth 3 (three) times daily.      . Tamsulosin HCl (FLOMAX) 0.4 MG CAPS Take by mouth daily.        Marland Kitchen VALERIAN ROOT PO Take 1 tablet by mouth at bedtime as needed. For insomnia      . zinc gluconate 50 MG tablet Take 50 mg by mouth daily.       No current facility-administered  medications on file prior to visit.    Allergies  Allergen Reactions  . Enablex [Darifenacin Hydrobromide Er]     PT STATES IT MAKES HIM "BONKERS" AND VERY TIRED  . Other     Intolerance to zetia and lipitor  . Sulfonamide Derivatives     Past Medical History  Diagnosis Date  . Coronary artery disease     LHC 4/10: Mid LAD 40-50%, then 70%, OM1 20-30%, EF 65%. Mid LAD FFR 0.89 (not hemodynamically significant). Medical therapy was continued.  . Obstructive sleep apnea   . Hyperlipidemia   . Chronic kidney disease     mild insuffiency    Past Surgical History  Procedure Laterality Date  . Cardiac catheterization  05/02/2003    single vessel,moderate stenosis mid LAD  . Cardiac catheterization  06/12/2008    continue med. therapy  . Right shoulder surgery      History  Smoking status  . Former Smoker -- 64 years  . Types: Pipe  . Quit date: 02/28/1978  Smokeless tobacco  . Not on file    Comment: smoked pipe only     History  Alcohol Use: Not on file    Family History  Problem Relation Age of Onset  . Cancer Mother   . Hypertension Mother   . Other Father     old age  . Hypertension Father     Review of Systems: The review of systems is positive for fatigue. Some dyspnea. All other systems were reviewed and are negative.  Physical Exam: BP 150/84  Pulse 74  Ht 5\' 10"  (1.778 m)  Wt 215 lb 8 oz (97.75 kg)  BMI 30.92 kg/m2 He is an overweight WM in NAD.  The HEENT exam  is normal.  The carotids are 2+ without bruits.  There is no thyromegaly.  There is no JVD.  The lungs are clear.   The heart exam reveals a regular rate with a normal S1 and S2.  There are no murmurs, gallops, or rubs.  The PMI is not displaced.   Abdominal exam reveals good bowel sounds.  There is no guarding or rebound.  There is no hepatosplenomegaly or tenderness.  There are no masses.  Exam of the legs reveal no clubbing, cyanosis, or edema.  The legs are without rashes.  The distal pulses  are intact.  Cranial nerves II - XII are intact.  Motor and sensory functions are intact.  The gait is normal.  LABORATORY DATA: Ecg: NSR with 1st degree AV block. Nonspecific T wave abnormality. No change.  Assessment / Plan:  1. Coronary disease with borderline LAD stenosis. Negative ischemic evaluation the past. We will continue medical management. I'll followup in 6 months.  2. Hyperlipidemia. We discussed recent data that showed no benefit to taking Niacin in addition to a statin. Recommend continuing Crestor.

## 2013-03-08 NOTE — Patient Instructions (Signed)
Continue your current therapy- you can stop niacin from my standpoint.  I will see you in 6 months.

## 2013-06-01 ENCOUNTER — Emergency Department (HOSPITAL_COMMUNITY)
Admission: EM | Admit: 2013-06-01 | Discharge: 2013-06-01 | Disposition: A | Discharge status: Home / Self Care | Payer: Medicare Other | Attending: Emergency Medicine | Admitting: Emergency Medicine

## 2013-06-01 ENCOUNTER — Encounter (HOSPITAL_COMMUNITY): Payer: Self-pay | Admitting: Emergency Medicine

## 2013-06-01 DIAGNOSIS — I2510 Atherosclerotic heart disease of native coronary artery without angina pectoris: Secondary | ICD-10-CM | POA: Insufficient documentation

## 2013-06-01 DIAGNOSIS — Z9889 Other specified postprocedural states: Secondary | ICD-10-CM | POA: Insufficient documentation

## 2013-06-01 DIAGNOSIS — Z79899 Other long term (current) drug therapy: Secondary | ICD-10-CM | POA: Insufficient documentation

## 2013-06-01 DIAGNOSIS — T169XXA Foreign body in ear, unspecified ear, initial encounter: Secondary | ICD-10-CM | POA: Insufficient documentation

## 2013-06-01 DIAGNOSIS — Z8669 Personal history of other diseases of the nervous system and sense organs: Secondary | ICD-10-CM | POA: Insufficient documentation

## 2013-06-01 DIAGNOSIS — IMO0002 Reserved for concepts with insufficient information to code with codable children: Secondary | ICD-10-CM | POA: Insufficient documentation

## 2013-06-01 DIAGNOSIS — Y929 Unspecified place or not applicable: Secondary | ICD-10-CM | POA: Insufficient documentation

## 2013-06-01 DIAGNOSIS — Y9389 Activity, other specified: Secondary | ICD-10-CM | POA: Insufficient documentation

## 2013-06-01 DIAGNOSIS — Z7982 Long term (current) use of aspirin: Secondary | ICD-10-CM | POA: Insufficient documentation

## 2013-06-01 DIAGNOSIS — N189 Chronic kidney disease, unspecified: Secondary | ICD-10-CM | POA: Insufficient documentation

## 2013-06-01 DIAGNOSIS — Z87891 Personal history of nicotine dependence: Secondary | ICD-10-CM | POA: Insufficient documentation

## 2013-06-01 DIAGNOSIS — T162XXA Foreign body in left ear, initial encounter: Secondary | ICD-10-CM

## 2013-06-01 DIAGNOSIS — E785 Hyperlipidemia, unspecified: Secondary | ICD-10-CM | POA: Insufficient documentation

## 2013-06-01 NOTE — ED Notes (Signed)
Pt has a hearing aid cover in his ear

## 2013-06-01 NOTE — ED Provider Notes (Signed)
CSN: [id number]     Arrival date & time 06/01/13  2113 History   This chart was scribed for non-physician practitioner Assunta Found, working with Leota Jacobsen, MD, by Neta Ehlers, ED Scribe. This patient was seen in room WTR2/WLPT2 and the patient's care was started at 9:25 PM. None    Chief Complaint  Patient presents with  . Otalgia    The history is provided by the patient. No language interpreter was used.   HPI Comments: [name] [name] is a 73 y.o. male who presents to the Emergency Department complaining of foreign body in his left ear. He states he pulled his hearing aid out an hour ago, but the tip from the hearing aid became lodged in the ear. [name] denies drainage from the ear, pain, or hearing difficulties. He also denies a h/o similar issues.   Past Medical History  Diagnosis Date  . Coronary artery disease     LHC 4/10: Mid LAD 40-50%, then 70%, OM1 20-30%, EF 65%. Mid LAD FFR 0.89 (not hemodynamically significant). Medical therapy was continued.  . Obstructive sleep apnea   . Hyperlipidemia   . Chronic kidney disease     mild insuffiency   Past Surgical History  Procedure Laterality Date  . Cardiac catheterization  05/02/2003    single vessel,moderate stenosis mid LAD  . Cardiac catheterization  06/12/2008    continue med. therapy  . Right shoulder surgery     Family History  Problem Relation Age of Onset  . Cancer Mother   . Hypertension Mother   . Other Father     old age  . Hypertension Father    History  Substance Use Topics  . Smoking status: Former Smoker -- 40 years    Types: Pipe    Quit date: 02/28/1978  . Smokeless tobacco: Not on file     Comment: smoked pipe only   . Alcohol Use: Not on file    Review of Systems  HENT: Negative for ear discharge, ear pain and hearing loss.     Allergies  Enablex; Other; and Sulfonamide derivatives  Home Medications   Current Outpatient Rx  Name  Route  Sig  Dispense  Refill  .  Ascorbic Acid (VITAMIN C) 1000 MG tablet   Oral   Take 1,000 mg by mouth daily.           Marland Kitchen aspirin EC 81 MG tablet   Oral   Take 81 mg by mouth daily.         . calcium-vitamin D (OSCAL WITH D) 500-200 MG-UNIT per tablet   Oral   Take 1 tablet by mouth 2 (two) times daily.         . furosemide (LASIX) 20 MG tablet   Oral   Take 20 mg by mouth daily.         . Glucosamine HCl-Glucosamin SO4 (GLUCOSAMINE COMPLEX PO)   Oral   Take by mouth daily.           . Multiple Vitamin (MULTIVITAMIN) tablet   Oral   Take 1 tablet by mouth daily.           . niacin (SLO-NIACIN) 500 MG tablet   Oral   Take 500 mg by mouth 2 (two) times daily.          . Omega-3 Fatty Acids (FISH OIL) 600 MG CAPS   Oral   Take 1 capsule by mouth 2 (two) times daily.          Marland Kitchen  OVER THE COUNTER MEDICATION      G A [facility] A (gets at [facility]) take 1 daily up to 5 times daily         . Psyllium (METAMUCIL PO)   Oral   Take by mouth daily.           . rosuvastatin (CRESTOR) 10 MG tablet   Oral   Take 0.5 tablets (5 mg total) by mouth every other day.   30 tablet   6   . Saw Palmetto 450 MG CAPS   Oral   Take 1 capsule by mouth 3 (three) times daily.         . Tamsulosin HCl (FLOMAX) 0.4 MG CAPS   Oral   Take by mouth daily.           Marland Kitchen VALERIAN ROOT PO   Oral   Take 1 tablet by mouth at bedtime as needed. For insomnia         . zinc gluconate 50 MG tablet   Oral   Take 50 mg by mouth daily.          BP 138/82  Pulse 72  Temp(Src) 97.6 F (36.4 C)  SpO2 94% Physical Exam  Nursing note and vitals reviewed. Constitutional: He is oriented to person, place, and time. He appears well-developed and well-nourished. No distress.  HENT:  Head: Normocephalic and atraumatic.  A rubber piece lodged midway in left ear.  Upon removal, normal canal. No hemotympanum. No perforated tympanic membrane. No pain.   Eyes: EOM are normal.  Neck: Neck supple. No tracheal deviation  present.  Cardiovascular: Normal rate, regular rhythm and normal heart sounds.   Pulmonary/Chest: Effort normal and breath sounds normal. No respiratory distress. He has no wheezes.  Musculoskeletal: Normal range of motion.  Neurological: He is alert and oriented to person, place, and time.  Skin: Skin is warm and dry.  Psychiatric: He has a normal mood and affect. His behavior is normal.    ED Course  Procedures (including critical care time)  DIAGNOSTIC STUDIES:   COORDINATION OF CARE:  9:30 PM- Discussed treatment plan with patient, and the patient agreed to the plan. The plan includes removal of the foreign body.   9:36 PM- Removed the foreign body.   Labs Review Labs Reviewed - No data to display Imaging Review No results found.   EKG Interpretation None      MDM   Final diagnoses:  Foreign body of ear, left   Upon removal, normal canal. Tympanic membrane normal. No pain. Patient has no symptoms as of current. Patient denies any questions at this time. Discharged in good condition.  I personally performed the services described in this documentation, which was scribed in my presence. The recorded information has been reviewed and is accurate.     Sherrie George, PA-C 06/02/13 [name]

## 2013-06-01 NOTE — Discharge Instructions (Signed)
Ear Foreign Body °An ear foreign body is an object that is stuck in the ear. It is common for young children to put objects into the ear canal. These may include pebbles, beads, beans, and any other small objects which will fit. In adults, objects such as cotton swabs may become lodged in the ear canal. In all ages, the most common foreign bodies are insects that enter the ear canal.  °SYMPTOMS  °Foreign bodies may cause pain, buzzing or roaring sounds, hearing loss, and ear drainage.  °HOME CARE INSTRUCTIONS  °· Keep all follow-up appointments with your caregiver as told. °· Keep small objects out of reach of young children. Tell them not to put anything in their ears. °SEEK IMMEDIATE MEDICAL CARE IF:  °· You have bleeding from the ear. °· You have increased pain or swelling of the ear. °· You have reduced hearing. °· You have discharge coming from the ear. °· You have a fever. °· You have a headache. °MAKE SURE YOU:  °· Understand these instructions. °· Will watch your condition. °· Will get help right away if you are not doing well or get worse. °Document Released: 02/12/2000 Document Revised: 05/09/2011 Document Reviewed: 10/03/2007 °Exit[employer]® Patient Information ©2014 [employer]. ° °

## 2013-06-04 NOTE — ED Provider Notes (Signed)
Medical screening examination/treatment/procedure(s) were performed by non-physician practitioner and as supervising physician I was immediately available for consultation/collaboration.   Leota Jacobsen, MD 06/04/13 [phone]

## 2013-09-18 ENCOUNTER — Encounter: Payer: Self-pay | Admitting: Cardiology

## 2013-09-18 ENCOUNTER — Ambulatory Visit (INDEPENDENT_AMBULATORY_CARE_PROVIDER_SITE_OTHER): Payer: Medicare Other | Admitting: Cardiology

## 2013-09-18 VITALS — BP 142/72 | HR 74 | Ht 70.0 in | Wt 215.9 lb

## 2013-09-18 DIAGNOSIS — N183 Chronic kidney disease, stage 3 unspecified: Secondary | ICD-10-CM

## 2013-09-18 DIAGNOSIS — I2510 Atherosclerotic heart disease of native coronary artery without angina pectoris: Secondary | ICD-10-CM

## 2013-09-18 DIAGNOSIS — E785 Hyperlipidemia, unspecified: Secondary | ICD-10-CM

## 2013-09-18 NOTE — Progress Notes (Signed)
[name] [name] Date of Birth: 10-14-1940   History of Present Illness: [name] [name] is seen today for followup of his coronary disease. He has a history of an intermediate stenosis in the mid LAD had a 70% by angiography. Previous nuclear stress test in 2008 was normal. He underwent cardiac catheterization in 2010 with flow wire evaluation of the LAD which was normal. He has been managed medically. He has had no significant change in his symptomatology.  He still complains of fatigue. 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   Current Outpatient Prescriptions on File Prior to Visit  Medication Sig Dispense Refill  . Ascorbic Acid (VITAMIN C) 1000 MG tablet Take 1,000 mg by mouth daily.        Marland Kitchen aspirin EC 81 MG tablet Take 81 mg by mouth daily.      . calcium-vitamin D (OSCAL WITH D) 500-200 MG-UNIT per tablet Take 1 tablet by mouth 2 (two) times daily.      . furosemide (LASIX) 20 MG tablet Take 20 mg by mouth daily.      . Glucosamine HCl-Glucosamin SO4 (GLUCOSAMINE COMPLEX PO) Take by mouth daily.        . Multiple Vitamin (MULTIVITAMIN) tablet Take 1 tablet by mouth daily.        . Omega-3 Fatty Acids (FISH OIL) 600 MG CAPS Take 1 capsule by mouth 2 (two) times daily.       Marland Kitchen OVER THE COUNTER MEDICATION G A [facility] A (gets at [facility]) take 1 daily up to 5 times daily      . Psyllium (METAMUCIL PO) Take by mouth daily.        . rosuvastatin (CRESTOR) 10 MG tablet Take 0.5 tablets (5 mg total) by mouth every other day.  30 tablet  6  . Saw Palmetto 450 MG CAPS Take 1 capsule by mouth 3 (three) times daily.      . Tamsulosin HCl (FLOMAX) 0.4 MG CAPS Take by mouth daily.        Marland Kitchen VALERIAN ROOT PO Take 1 tablet by mouth at bedtime as needed. For insomnia      . zinc gluconate 50 MG tablet Take 50 mg by mouth daily.       No current facility-administered medications on file prior to visit.    Allergies  Allergen Reactions  . Enablex  [Darifenacin Hydrobromide Er]     PT STATES IT MAKES HIM "BONKERS" AND VERY TIRED  . Other     Intolerance to zetia and lipitor  . Sulfonamide Derivatives     Past Medical History  Diagnosis Date  . Coronary artery disease     LHC 4/10: Mid LAD 40-50%, then 70%, OM1 20-30%, EF 65%. Mid LAD FFR 0.89 (not hemodynamically significant). Medical therapy was continued.  . Obstructive sleep apnea   . Hyperlipidemia   . Chronic kidney disease     mild insuffiency    Past Surgical History  Procedure Laterality Date  . Cardiac catheterization  05/02/2003    single vessel,moderate stenosis mid LAD  . Cardiac catheterization  06/12/2008    continue med. therapy  . Right shoulder surgery      History  Smoking status  . Former Smoker -- 70 years  . Types: Pipe  . Quit date: 02/28/1978  Smokeless tobacco  . Not on file    Comment: smoked pipe only     History  Alcohol  Use: Not on file    Family History  Problem Relation Age of Onset  . Cancer Mother   . Hypertension Mother   . Other Father     old age  . Hypertension Father     Review of Systems: The review of systems is positive for fatigue. Some dyspnea. All other systems were reviewed and are negative.  Physical Exam: BP 142/72  Pulse 74  Ht 5\' 10"  (1.778 m)  Wt 215 lb 14.4 oz (97.932 kg)  BMI 30.98 kg/m2 He is an overweight WM in NAD.  The HEENT exam  is normal.  The carotids are 2+ without bruits.  There is no thyromegaly.  There is no JVD.  The lungs are clear.   The heart exam reveals a regular rate with a normal S1 and S2.  There are no murmurs, gallops, or rubs.  The PMI is not displaced.   Abdominal exam reveals good bowel sounds.  Obese.  There is no hepatosplenomegaly or tenderness.  There are no masses.  Exam of the legs reveal no clubbing, cyanosis, or edema  The distal pulses are intact.  Cranial nerves II - XII are intact.   LABORATORY DATA:   Assessment / Plan:  1. Coronary disease with borderline LAD  stenosis. Negative ischemic evaluation the past. We will continue medical management. I'll followup in 6 months.  2. Hyperlipidemia. On statin.  3. Chronic fatigue.   I have encouraged him to lose weight and to get more aerobic exercise. I will request his last lab work from primary care.

## 2013-09-18 NOTE — Patient Instructions (Signed)
Continue your current therapy.  You need to lose some weight.  I will request a copy of your lab work  I will see you in 6 months.

## 2014-01-07 ENCOUNTER — Encounter: Payer: Self-pay | Admitting: Internal Medicine

## 2014-01-07 ENCOUNTER — Ambulatory Visit (INDEPENDENT_AMBULATORY_CARE_PROVIDER_SITE_OTHER): Payer: Medicare Other | Admitting: Internal Medicine

## 2014-01-07 VITALS — BP 138/80 | HR 85 | Ht 69.0 in | Wt 223.0 lb

## 2014-01-07 DIAGNOSIS — G4733 Obstructive sleep apnea (adult) (pediatric): Secondary | ICD-10-CM

## 2014-01-07 DIAGNOSIS — I2510 Atherosclerotic heart disease of native coronary artery without angina pectoris: Secondary | ICD-10-CM
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
## 2014-01-07 NOTE — Patient Instructions (Signed)
Order- DME [facility] replacement for old  cpap machine 10 cwp, airview, humidifier, supplies, mask of choice    Dx  OSA  Please call as needed

## 2014-01-12 NOTE — Assessment & Plan Note (Signed)
Good compliance and control. No changes required. Weight loss would help.

## 2014-02-25 ENCOUNTER — Telehealth: Payer: Self-pay | Admitting: Internal Medicine

## 2014-02-25 NOTE — Telephone Encounter (Signed)
Will fax OV notes in AM.

## 2014-03-07 ENCOUNTER — Other Ambulatory Visit: Payer: Self-pay | Admitting: Cardiology

## 2014-03-26 ENCOUNTER — Encounter: Payer: Self-pay | Admitting: Cardiology

## 2014-03-26 ENCOUNTER — Ambulatory Visit (INDEPENDENT_AMBULATORY_CARE_PROVIDER_SITE_OTHER): Payer: Medicare Other | Admitting: Cardiology

## 2014-03-26 VITALS — BP 128/76 | HR 82 | Ht 70.0 in | Wt 218.0 lb

## 2014-03-26 DIAGNOSIS — I440 Atrioventricular block, first degree: Secondary | ICD-10-CM

## 2014-03-26 DIAGNOSIS — E785 Hyperlipidemia, unspecified: Secondary | ICD-10-CM

## 2014-03-26 DIAGNOSIS — I2510 Atherosclerotic heart disease of native coronary artery without angina pectoris: Secondary | ICD-10-CM

## 2014-03-26 NOTE — Patient Instructions (Signed)
Continue your current therapy  I will see you in 6 months.   

## 2014-03-27 ENCOUNTER — Telehealth: Payer: Self-pay | Admitting: Internal Medicine

## 2014-03-27 NOTE — Telephone Encounter (Signed)
Will forward to CY so that he may be aware.

## 2014-03-27 NOTE — Progress Notes (Signed)
[name] [name] Date of Birth: December 05, 1940   History of Present Illness: [name] [name] is seen today for followup of his coronary disease. He has a history of an intermediate stenosis in the mid LAD had a 70% by angiography. Previous nuclear stress test in 2008 was normal. He underwent cardiac catheterization in 2010 with flow wire evaluation of the LAD which was normal. He has been managed medically. He has had no significant change in his symptomatology. He denies any chest pain or SOB. 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 He does complain of depression.  He still complains of fatigue. He is under a lot of stress.    Current Outpatient Prescriptions on File Prior to Visit  Medication Sig Dispense Refill  . Ascorbic Acid (VITAMIN C) 1000 MG tablet Take 1,000 mg by mouth daily.      Marland Kitchen aspirin EC 81 MG tablet Take 81 mg by mouth daily.    . calcium-vitamin D (OSCAL WITH D) 500-200 MG-UNIT per tablet Take 1 tablet by mouth 2 (two) times daily.    . CRESTOR 10 MG tablet TAKE 1/2 TABLET BY MOUTH EVERY OTHER DAY 30 tablet 0  . furosemide (LASIX) 20 MG tablet Take 20 mg by mouth daily.    . Glucosamine HCl-Glucosamin SO4 (GLUCOSAMINE COMPLEX PO) Take by mouth daily.      . Multiple Vitamin (MULTIVITAMIN) tablet Take 1 tablet by mouth daily.      . Omega-3 Fatty Acids (FISH OIL) 600 MG CAPS Take 1 capsule by mouth 2 (two) times daily.     Marland Kitchen OVER THE COUNTER MEDICATION G A M A (gets at [facility]) take 1 daily up to 2 times daily    . Psyllium (METAMUCIL PO) Take by mouth daily.      . Saw Palmetto 450 MG CAPS Take 1 capsule by mouth 3 (three) times daily.    . Theanine 100 MG CAPS Take 200 mg by mouth daily.    Marland Kitchen VALERIAN ROOT PO Take 1 tablet by mouth at bedtime as needed. For insomnia    . Vitamin D, Ergocalciferol, (DRISDOL) 50000 UNITS CAPS capsule     . zinc gluconate 50 MG tablet Take 50 mg by mouth daily.     No current facility-administered medications on file prior to visit.    Allergies   Allergen Reactions  . Enablex [Darifenacin Hydrobromide Er]     PT STATES IT MAKES HIM "BONKERS" AND VERY TIRED  . Other     Intolerance to zetia and lipitor  . Oxybutynin Other (See Comments)    Memory impairment, temperament changes  . Sulfonamide Derivatives     Past Medical History  Diagnosis Date  . Coronary artery disease     LHC 4/10: Mid LAD 40-50%, then 70%, OM1 20-30%, EF 65%. Mid LAD FFR 0.89 (not hemodynamically significant). Medical therapy was continued.  . Obstructive sleep apnea   . Hyperlipidemia   . Chronic kidney disease     mild insuffiency    Past Surgical History  Procedure Laterality Date  . Cardiac catheterization  05/02/2003    single vessel,moderate stenosis mid LAD  . Cardiac catheterization  06/12/2008    continue med. therapy  . Right shoulder surgery      History  Smoking status  . Former Smoker -- 65 years  . Types: Pipe  . Quit date: 02/28/1978  Smokeless tobacco  . Never Used    Comment: smoked pipe only     History  Alcohol Use  . 0.0 oz/week  . 0 Not specified per week    Comment: rarely wine    Family History  Problem Relation Age of Onset  . Cancer Mother   . Hypertension Mother   . Other Father     old age  . Hypertension Father     Review of Systems: As noted in HPI. All other systems were reviewed and are negative.  Physical Exam: BP 128/76 mmHg  Pulse 82  Ht 5\' 10"  (1.778 m)  Wt 218 lb (98.884 kg)  BMI 31.28 kg/m2 He is an overweight WM in NAD.  The HEENT exam  is normal.  The carotids are 2+ without bruits.  There is no thyromegaly.  There is no JVD.  The lungs are clear.   The heart exam reveals a regular rate with a normal S1 and S2.  There are no murmurs, gallops, or rubs.  The PMI is not displaced.   Abdominal exam reveals good bowel sounds.  Obese.  There is no hepatosplenomegaly or tenderness.  There are no masses.  Exam of the legs reveal no clubbing, cyanosis, or edema  The distal pulses are intact.   Cranial nerves II - XII are intact.   LABORATORY DATA: Ecg today shows NSR with rate 82 bpm. Nonspecific T wave abnormality.  Assessment / Plan:  1. Coronary disease with borderline LAD stenosis. Negative ischemic evaluation the past. We will continue medical management. I'll followup in 6 months.  2. Hyperlipidemia. On statin.  3. Chronic fatigue/ depression. Per primary care.   I have encouraged him to lose weight and to get more aerobic exercise. I will request his last lab work from primary care.

## 2014-03-27 NOTE — Telephone Encounter (Signed)
Noted  

## 2014-04-29 ENCOUNTER — Telehealth: Payer: Self-pay | Admitting: Cardiology

## 2014-04-29 DIAGNOSIS — I509 Heart failure, unspecified: Secondary | ICD-10-CM | POA: Insufficient documentation

## 2014-04-29 DIAGNOSIS — I5032 Chronic diastolic (congestive) heart failure: Secondary | ICD-10-CM | POA: Insufficient documentation

## 2014-04-29 NOTE — Addendum Note (Signed)
Addended by: Golden Hurter D on: 04/29/2014 01:37 PM   Modules accepted: Orders, Medications

## 2014-04-29 NOTE — Telephone Encounter (Signed)
Returned call to patient he stated he wanted to ask Dr.Jordan if he can stop furosemide.Stated he is weak and feels like furosemide is causing him to be dehydrated.Message sent to [place].

## 2014-04-29 NOTE — Telephone Encounter (Signed)
He can try taking lasix on a prn basis for swelling.  Vadhir Mcnay [place] MD, [facility]

## 2014-04-29 NOTE — Telephone Encounter (Signed)
Returned call to patient Dr.Jordan advised to try taking lasix as needed for swelling.Advised to call back if continues to feel weak.Follow up appointment scheduled with Dr.Jordan 08/15/14 at 10:00 am.

## 2014-04-29 NOTE — Telephone Encounter (Signed)
[name] [name] is calling to find out if he should continue with Furosemide. Thinking about dropping the medication . Thanks

## 2014-07-10 ENCOUNTER — Other Ambulatory Visit: Payer: Self-pay | Admitting: Cardiology

## 2014-08-06 ENCOUNTER — Ambulatory Visit (INDEPENDENT_AMBULATORY_CARE_PROVIDER_SITE_OTHER): Payer: Medicare Other | Admitting: Family Medicine

## 2014-08-06 ENCOUNTER — Ambulatory Visit (INDEPENDENT_AMBULATORY_CARE_PROVIDER_SITE_OTHER): Payer: Medicare Other

## 2014-08-06 VITALS — BP 128/78 | HR 90 | Temp 98.1°F | Resp 16 | Ht 68.0 in | Wt 217.0 lb

## 2014-08-06 DIAGNOSIS — M25462 Effusion, left knee: Secondary | ICD-10-CM

## 2014-08-06 DIAGNOSIS — S8002XA Contusion of left knee, initial encounter: Secondary | ICD-10-CM

## 2014-08-06 MED ORDER — DICLOFENAC SODIUM 75 MG PO TBEC: 75.0000 mg | DELAYED_RELEASE_TABLET | Freq: Two times a day (BID) | ORAL | Status: AC

## 2014-08-06 NOTE — Progress Notes (Signed)
[name] [name] - 74 y.o. male MRN [id number]  Date of birth: 1941/01/28  SUBJECTIVE:  Including CC & ROS.  Patient presented today with his daughter at bedside. Reports that he fell walking out of convenience store yesterday evening. Patient tripped over curb landing on his left knee with most of his weight and slight impactors right knee. Patient was seen in the emergency room at [facility] yesterday evening and had a CT scan of his head shows negative and x-rays of his left knee which were also negative. Patient was placed in a knee immobilizer and given pain medication. Patient continues to have significant swelling and pain in his left knee and difficulty ambulating the knee immobilizer. He has not taken any pain medication as he feels his pain has fairly well-controlled without narcotics. Reports lower extremity swelling and consistent bruising. Difficulty ambulating and bearing weight. Has been using a walker with some difficulty.Denies any symptoms of headache, nausea, dizziness, disorientation, light sensitivity, sound sensitivity, confusion, fogginess   ROS:  Constitutional:  No fever, chills, or fatigue.  Respiratory:  No shortness of breath, cough, or wheezing Cardiovascular:  No palpitations, chest pain or syncope Gastrointestinal:  No nausea, no abdominal pain Review of systems otherwise negative except for what is stated in HPI  HISTORY: Past Medical, Surgical, Social, and Family History Reviewed & Updated per EMR. Pertinent Historical Findings include: Hyperlipidemia, sleep apnea, chronic kidney disease stage II.  PHYSICAL EXAM:  VS: BP:128/78 mmHg  HR:90bpm  TEMP:98.1 F (36.7 C)(Oral)  RESP:98 %  HT:5\' 8"  (172.7 cm)   WT:217 lb (98.431 kg)  BMI:33.1 Left KNEE EXAM:  General: well nourished, no acute distress Skin of LE: Patient has moderate ecchymosis with no erythema around the left knee no warmth. Vascular: Dorsal pedal pulses 2+ bilaterally Neurologically: Sensation  to light touch lower extremities equal and intact  Gross soft tissue swelling with bruising, hematoma, possible 2+ effusion, and lower extremity pitting edema  Palpation: no warmth or joint line tenderness significant tenderness over the patella and his prepatellar bruising. Range of motion: Decreased extension and flexion due to pain and swelling. Ligaments with solid consistent endpoints including ACL, PCL, LCL, MCL. Hamstring and quadriceps strength is normal.  [facility] reading (PRIMARY) by Dr. Ollen Barges 3 view of the left knee: No acute fracture dislocation mild degenerative changes   ASSESSMENT & PLAN:  Impression: Left knee contusion secondary to fall Possible joint effusion Negative x-rays  Recommendations: -Advised patient concerned up all possible fusion intermittent some of swelling pain. Offered aspiration of hematoma. Patient was agreeable as planned see procedure note below. -Encouraged patient and daughter at bedside to treat with elevation compression and icing over the next several days. Patient would placed in Ace bandage up to the level of the knee as well as a hinged knee brace. Continue to use walker for support. Suspect at least 2-3 weeks of discomfort until swelling and bruising have resolved. -Provided patient with a prescription for diclofenac twice a day for the next 7-10 days to help with swelling and inflammation. -Patient will follow-up when necessary if symptoms are not improving in 2-3 weeks.  Aspiration procedure: Consent obtained and verified. Sterile betadine prep. Furthur cleansed with alcohol and betadine. Topical analgesic spray: Ethyl chloride. Aspiration for suspected left knee joint effusion from acute injury and fall Approached in typical fashion with: Lateral approach Meds: Skin was anesthetized with 3 mL of lidocaine 5-gauge 1-1/2 inch needle Needle: After cleaning and prepping patient's skin with Betadine and alcohol and skin  was anesthetized  18-gauge needle was used to attempt to aspirate the knee. Only a small 1 mL of serosanguineous bloody fluid was expired. Discontinued aspiration is not a significant joint effusion was present. Suspect majority of swelling is from superficial hematoma and fat pad swelling. Completed without difficulty Aftercare instructions and Red flags advised. Advised to call if fevers/chills, erythema, induration, drainage, or persistent bleeding.   Gross sideeffects, risk and benefits, and alternatives of medications d/w patient. Patient is aware that all medications have potential sideeffects and we are unable to predict every sideeffect or drug-drug interaction that may occur  Frio, DO

## 2014-08-06 NOTE — Progress Notes (Signed)
Xray read and patient discussed with Dr. Ollen Barges. Agree with assessment and plan of care per her note.

## 2014-08-06 NOTE — Patient Instructions (Signed)
For knee care: Ice the knee 4-5 times a day for 20 minutes with ice pack or bag  Keep the knee elevated over several pillows above the level of the hip to help with swelling Applied Ace bandage up to the level just above the knee to provide compression and help with swelling.  May start anti-inflammatory medicine diclofenac twice daily starting tomorrow take for 7-10 days then stop  Continue Pain medication for break through pain

## 2014-08-15 ENCOUNTER — Encounter: Payer: Self-pay | Admitting: Cardiology

## 2014-08-15 ENCOUNTER — Other Ambulatory Visit: Payer: Self-pay | Admitting: Cardiology

## 2014-08-15 ENCOUNTER — Ambulatory Visit (HOSPITAL_COMMUNITY): Payer: Medicare Other | Attending: Cardiovascular Disease

## 2014-08-15 ENCOUNTER — Ambulatory Visit (INDEPENDENT_AMBULATORY_CARE_PROVIDER_SITE_OTHER): Payer: Medicare Other | Admitting: Cardiology

## 2014-08-15 VITALS — BP 140/72 | HR 74 | Ht 70.0 in | Wt 223.2 lb

## 2014-08-15 DIAGNOSIS — I2510 Atherosclerotic heart disease of native coronary artery without angina pectoris: Secondary | ICD-10-CM

## 2014-08-15 DIAGNOSIS — M7989 Other specified soft tissue disorders: Secondary | ICD-10-CM

## 2014-08-15 DIAGNOSIS — E785 Hyperlipidemia, unspecified: Secondary | ICD-10-CM

## 2014-08-15 NOTE — Patient Instructions (Addendum)
We will do a venous doppler of your left leg.  Continue your current therapy

## 2014-08-15 NOTE — Progress Notes (Signed)
[name] Date of Birth: 1940-07-15   History of Present Illness: [name] is seen today for followup of his coronary disease. He has a history of an intermediate stenosis in the mid LAD had a 70% by angiography. Previous nuclear stress test in 2008 was normal. He underwent cardiac catheterization in 2010 with flow wire evaluation of the LAD which was normal. He has been managed medically.  On follow up today he has had no significant change in his symptomatology. He denies any chest pain or SOB.  10 days ago he fell stepping off his curb and injured his left knee. Extensive bruising of left leg. Seen in ED- knee Xrays OK. Cranial CT ok. Seen also and Urgent care and by primary care. Attempted aspiration of hematoma without result. Treated with knee immobilizer and NSAIDs. Concerned about continued swelling of leg. Pain is some better.    Current Outpatient Prescriptions on File Prior to Visit  Medication Sig Dispense Refill  . Ascorbic Acid (VITAMIN C) 1000 MG tablet Take 1,000 mg by mouth daily.      Marland Kitchen aspirin EC 81 MG tablet Take 81 mg by mouth daily.    . calcium-vitamin D (OSCAL WITH D) 500-200 MG-UNIT per tablet Take 1 tablet by mouth 2 (two) times daily.    . CRESTOR 10 MG tablet TAKE 1/2 TABLET BY MOUTH EVERY OTHER DAY 30 tablet 0  . diclofenac (VOLTAREN) 75 MG EC tablet Take 1 tablet (75 mg total) by mouth 2 (two) times daily. 30 tablet 0  . furosemide (LASIX) 20 MG tablet Take 20 mg daily if needed for swelling 30 tablet 6  . Glucosamine HCl-Glucosamin SO4 (GLUCOSAMINE COMPLEX PO) Take by mouth daily.      . Multiple Vitamin (MULTIVITAMIN) tablet Take 1 tablet by mouth daily.      . Omega-3 Fatty Acids (FISH OIL) 600 MG CAPS Take 1 capsule by mouth 2 (two) times daily.     Marland Kitchen OVER THE COUNTER MEDICATION G A M A (gets at [facility]) take 1 daily up to 2 times daily    . Psyllium (METAMUCIL PO) Take by mouth daily.      . Saw Palmetto 450 MG CAPS Take 1 capsule by mouth 3 (three)  times daily.    . Theanine 100 MG CAPS Take 200 mg by mouth daily.    Marland Kitchen VALERIAN ROOT PO Take 1 tablet by mouth at bedtime as needed. For insomnia    . zinc gluconate 50 MG tablet Take 50 mg by mouth daily.    . Vitamin D, Ergocalciferol, (DRISDOL) 50000 UNITS CAPS capsule      No current facility-administered medications on file prior to visit.    Allergies  Allergen Reactions  . Enablex [Darifenacin Hydrobromide Er]     PT STATES IT MAKES HIM "BONKERS" AND VERY TIRED  . Other     Intolerance to zetia and lipitor  . Oxybutynin Other (See Comments)    Memory impairment, temperament changes  . Sulfonamide Derivatives     Past Medical History  Diagnosis Date  . Coronary artery disease     LHC 4/10: Mid LAD 40-50%, then 70%, OM1 20-30%, EF 65%. Mid LAD FFR 0.89 (not hemodynamically significant). Medical therapy was continued.  . Obstructive sleep apnea   . Hyperlipidemia   . Chronic kidney disease     mild insuffiency    Past Surgical History  Procedure Laterality Date  . Cardiac catheterization  05/02/2003    single vessel,moderate  stenosis mid LAD  . Cardiac catheterization  06/12/2008    continue med. therapy  . Right shoulder surgery      History  Smoking status  . Former Smoker -- 59 years  . Types: Pipe  . Quit date: 02/28/1978  Smokeless tobacco  . Never Used    Comment: smoked pipe only     History  Alcohol Use  . 0.0 oz/week  . 0 Standard drinks or equivalent per week    Comment: rarely wine    Family History  Problem Relation Age of Onset  . Cancer Mother   . Hypertension Mother   . Other Father     old age  . Hypertension Father     Review of Systems: As noted in HPI. All other systems were reviewed and are negative.  Physical Exam: BP 140/72 mmHg  Pulse 74  Ht 5\' 10"  (1.778 m)  Wt 101.237 kg (223 lb 3 oz)  BMI 32.02 kg/m2 He is an overweight WM in NAD.  The HEENT exam  is normal.  The carotids are 2+ without bruits.  There is no  thyromegaly.  There is no JVD.  The lungs are clear.   The heart exam reveals a regular rate with a normal S1 and S2.  There are no murmurs, gallops, or rubs.  The PMI is not displaced.   Abdominal exam reveals good bowel sounds.  Obese.  There is no hepatosplenomegaly or tenderness.  There are no masses.  Left knee is in immobilizer. Leg is extensively bruised below the knee with diffuse swelling.  The distal pulses are intact.  Cranial nerves II - XII are intact.   LABORATORY DATA:   Assessment / Plan:  1. Coronary disease with borderline LAD stenosis. Negative ischemic evaluation the past. We will continue medical management. I'll followup in 6 months.  2. Hyperlipidemia. On statin.  3. Chronic fatigue/ depression.   4. Recent mechanical fall with injury to left knee and extensive bruising and swelling of left leg. Will check LE venous doppler to rule out DVT. If negative continue conservative management.

## 2014-08-23 ENCOUNTER — Inpatient Hospital Stay (HOSPITAL_COMMUNITY)
Admission: EM | Admit: 2014-08-23 | Discharge: 2014-08-25 | DRG: 603 | Disposition: A | Discharge status: Home / Self Care | Payer: Medicare Other | Attending: Internal Medicine | Admitting: Internal Medicine

## 2014-08-23 ENCOUNTER — Encounter (HOSPITAL_COMMUNITY): Payer: Self-pay | Admitting: Emergency Medicine

## 2014-08-23 DIAGNOSIS — G4733 Obstructive sleep apnea (adult) (pediatric): Secondary | ICD-10-CM | POA: Diagnosis not present

## 2014-08-23 DIAGNOSIS — M7989 Other specified soft tissue disorders: Secondary | ICD-10-CM | POA: Diagnosis present

## 2014-08-23 DIAGNOSIS — Z87891 Personal history of nicotine dependence: Secondary | ICD-10-CM

## 2014-08-23 DIAGNOSIS — N182 Chronic kidney disease, stage 2 (mild): Secondary | ICD-10-CM | POA: Diagnosis not present

## 2014-08-23 DIAGNOSIS — M79605 Pain in left leg: Secondary | ICD-10-CM | POA: Diagnosis not present

## 2014-08-23 DIAGNOSIS — Z8249 Family history of ischemic heart disease and other diseases of the circulatory system: Secondary | ICD-10-CM

## 2014-08-23 DIAGNOSIS — Z79899 Other long term (current) drug therapy: Secondary | ICD-10-CM

## 2014-08-23 DIAGNOSIS — L03116 Cellulitis of left lower limb: Secondary | ICD-10-CM | POA: Diagnosis not present

## 2014-08-23 DIAGNOSIS — I2510 Atherosclerotic heart disease of native coronary artery without angina pectoris: Secondary | ICD-10-CM | POA: Diagnosis present

## 2014-08-23 DIAGNOSIS — E785 Hyperlipidemia, unspecified: Secondary | ICD-10-CM | POA: Diagnosis present

## 2014-08-23 DIAGNOSIS — Z7982 Long term (current) use of aspirin: Secondary | ICD-10-CM

## 2014-08-23 DIAGNOSIS — N1831 Chronic kidney disease, stage 3a: Secondary | ICD-10-CM | POA: Diagnosis present

## 2014-08-23 DIAGNOSIS — L0390 Cellulitis, unspecified: Secondary | ICD-10-CM | POA: Diagnosis present

## 2014-08-23 LAB — CBC WITH DIFFERENTIAL/PLATELET
Basophils Absolute: 0 10*3/uL (ref 0.0–0.1)
Basophils Relative: 0 % (ref 0–1)
Eosinophils Absolute: 0.2 10*3/uL (ref 0.0–0.7)
Eosinophils Relative: 3 % (ref 0–5)
HCT: 37.5 % — ABNORMAL LOW (ref 39.0–52.0)
Hemoglobin: 12.6 g/dL — ABNORMAL LOW (ref 13.0–17.0)
Lymphocytes Relative: 23 % (ref 12–46)
Lymphs Abs: 1.4 10*3/uL (ref 0.7–4.0)
MCH: 31.5 pg (ref 26.0–34.0)
MCHC: 33.6 g/dL (ref 30.0–36.0)
MCV: 93.8 fL (ref 78.0–100.0)
Monocytes Absolute: 0.4 10*3/uL (ref 0.1–1.0)
Monocytes Relative: 6 % (ref 3–12)
Neutro Abs: 4 10*3/uL (ref 1.7–7.7)
Neutrophils Relative %: 68 % (ref 43–77)
Platelets: 294 10*3/uL (ref 150–400)
RBC: 4 MIL/uL — ABNORMAL LOW (ref 4.22–5.81)
RDW: 12.7 % (ref 11.5–15.5)
WBC: 5.9 10*3/uL (ref 4.0–10.5)

## 2014-08-23 LAB — BASIC METABOLIC PANEL
Anion gap: 7 (ref 5–15)
BUN: 24 mg/dL — ABNORMAL HIGH (ref 6–20)
CO2: 28 mmol/L (ref 22–32)
Calcium: 9.9 mg/dL (ref 8.9–10.3)
Chloride: 104 mmol/L (ref 101–111)
Creatinine, Ser: 1.14 mg/dL (ref 0.61–1.24)
GFR calc Af Amer: >60 mL/min (ref >60)
GFR calc non Af Amer: >60 mL/min (ref >60)
Glucose, Bld: 116 mg/dL — ABNORMAL HIGH (ref 65–99)
Potassium: 4.3 mmol/L (ref 3.5–5.1)
Sodium: 139 mmol/L (ref 135–145)

## 2014-08-23 MED ORDER — VANCOMYCIN HCL IN DEXTROSE 1-5 GM/200ML-% IV SOLN
1000.0000 mg | Freq: Once | INTRAVENOUS | Status: AC
  Administered 2014-08-23: 1000 mg via INTRAVENOUS
  Filled 2014-08-23: qty 200

## 2014-08-23 NOTE — ED Provider Notes (Signed)
CSN: [id number]     Arrival date & time 08/23/14  2009 History   First MD Initiated Contact with Patient 08/23/14 2141     Chief Complaint  Patient presents with  . Leg Swelling     (Consider location/radiation/quality/duration/timing/severity/associated sxs/prior Treatment) HPI Pt is a 74yo male with hx of CAD, hyperlipidemia, and CKD, presenting to ED with c/o gradually worsening Left leg pain, swelling, redness and warmth.  Pt reports trip and fall on his knees on June 7th.  He was initially seen at [facility] where he had xrays of his knee, was given a knee brace and ACE wrap and advised to f/u with his PCP.  He continued to have redness and swelling despite elevation and compression so he had a doppler U/S of his Left leg yesterday which was negative for DVT but today, redness and warmth worst. Left leg pain is minimal, aching, worse with prolonged standing and walking.  No prior hx of PVD.  No hx of diabetes.  No fever, chills, n/v/d. Pt takes 81mg  aspirin daily.  Denies CP or SOB.  Past Medical History  Diagnosis Date  . Coronary artery disease     LHC 4/10: Mid LAD 40-50%, then 70%, OM1 20-30%, EF 65%. Mid LAD FFR 0.89 (not hemodynamically significant). Medical therapy was continued.  . Obstructive sleep apnea   . Hyperlipidemia   . Chronic kidney disease     mild insuffiency   Past Surgical History  Procedure Laterality Date  . Cardiac catheterization  05/02/2003    single vessel,moderate stenosis mid LAD  . Cardiac catheterization  06/12/2008    continue med. therapy  . Right shoulder surgery     Family History  Problem Relation Age of Onset  . Cancer Mother   . Hypertension Mother   . Other Father     old age  . Hypertension Father    History  Substance Use Topics  . Smoking status: Former Smoker -- 40 years    Types: Pipe    Quit date: 02/28/1978  . Smokeless tobacco: Never Used     Comment: smoked pipe only   . Alcohol Use: 0.0 oz/week    0 Standard  drinks or equivalent per week     Comment: rarely wine    Review of Systems  Constitutional: Negative for fever, chills and fatigue.  Cardiovascular: Positive for leg swelling (left). Negative for chest pain and palpitations.  Gastrointestinal: Negative for nausea, vomiting, abdominal pain and diarrhea.  Musculoskeletal: Positive for myalgias and joint swelling. Negative for back pain and arthralgias.  Skin: Positive for color change (Left leg-redness). Negative for pallor, rash and wound.  Neurological: Positive for weakness ( bilateral legs with prolonged standing/walking).  All other systems reviewed and are negative.     Allergies  Enablex; Other; Oxybutynin; and Sulfonamide derivatives  Home Medications   Prior to Admission medications   Medication Sig Start Date End Date Taking? Authorizing Provider  Ascorbic Acid (VITAMIN C) 1000 MG tablet Take 1,000 mg by mouth daily.     Yes Historical Provider, MD  aspirin EC 81 MG tablet Take 81 mg by mouth daily.   Yes Historical Provider, MD  calcium-vitamin D (OSCAL WITH D) 500-200 MG-UNIT per tablet Take 1 tablet by mouth 2 (two) times daily.   Yes Historical Provider, MD  CRESTOR 10 MG tablet TAKE 1/2 TABLET BY MOUTH EVERY OTHER DAY 03/07/14  Yes Peter M Martinique, MD  diclofenac (VOLTAREN) 75 MG EC tablet Take 75 mg  by mouth 2 (two) times daily.  08/07/14  Yes Historical Provider, MD  furosemide (LASIX) 20 MG tablet Take 20 mg by mouth daily as needed for fluid (swelling). Take 20 mg daily if needed for swelling 04/29/14  Yes Peter M Martinique, MD  Glucosamine HCl-Glucosamin SO4 (GLUCOSAMINE COMPLEX PO) Take 1 tablet by mouth daily.    Yes Historical Provider, MD  Multiple Vitamin (MULTIVITAMIN) tablet Take 1 tablet by mouth daily.     Yes Historical Provider, MD  Omega-3 Fatty Acids (FISH OIL) 600 MG CAPS Take 1 capsule by mouth 2 (two) times daily.    Yes Historical Provider, MD  OVER THE COUNTER MEDICATION Take 1 tablet by mouth 2 (two) times  daily. G A M A (gets at [employer] [facility]).   Yes Historical Provider, MD  Psyllium (METAMUCIL PO) Take 5 mLs by mouth 2 (two) times daily.    Yes Historical Provider, MD  Saw Palmetto 450 MG CAPS Take 1 capsule by mouth 2 (two) times daily.    Yes Historical Provider, MD  Theanine 100 MG CAPS Take 100 mg by mouth daily.    Yes Historical Provider, MD  VALERIAN ROOT PO Take 1 tablet by mouth at bedtime as needed (muscle spasms).    Yes Historical Provider, MD   BP 171/90 mmHg  Pulse 69  Temp(Src) 97.4 F (36.3 C) (Oral)  Resp 18  Ht 5\' 10"  (1.778 m)  Wt 222 lb (100.699 kg)  BMI 31.85 kg/m2  SpO2 98% Physical Exam  Constitutional: He appears well-developed and well-nourished.  HENT:  Head: Normocephalic and atraumatic.  Eyes: Conjunctivae are normal. No scleral icterus.  Neck: Normal range of motion.  Cardiovascular: Normal rate, regular rhythm and normal heart sounds.   Pulses:      Dorsalis pedis pulses are 2+ on the right side, and 2+ on the left side.  Pulmonary/Chest: Effort normal and breath sounds normal. No respiratory distress. He has no wheezes. He has no rales. He exhibits no tenderness.  Abdominal: Soft. Bowel sounds are normal. He exhibits no distension and no mass. There is no tenderness. There is no rebound and no guarding.  Musculoskeletal: Normal range of motion. He exhibits edema and tenderness.  Left leg: 2-3+ pitting edema from knee to toes.  FROM Left knee, ankle, and toes. Increased pain with full knee flexion. Mild tenderness to anterior tibia. Compartments are soft. (see skin exam).  Neurological: He is alert.  Skin: Skin is warm and dry. There is erythema.  Left leg: skin in tact, trace dried skin c/w healed superficial abrasion.  Diffuse erythema and warmth of left leg from knee to toes.  No induration or fluctuance. No petechiae or purpura   Nursing note and vitals reviewed.   ED Course  Procedures (including critical care time) Labs Review Labs Reviewed  BASIC  METABOLIC PANEL - Abnormal; Notable for the following:    Glucose, Bld 116 (*)    BUN 24 (*)    All other components within normal limits  CBC WITH DIFFERENTIAL/PLATELET - Abnormal; Notable for the following:    RBC 4.00 (*)    Hemoglobin 12.6 (*)    HCT 37.5 (*)    All other components within normal limits    Imaging Review No results found.   EKG Interpretation None      MDM   Final diagnoses:  Cellulitis of left lower leg   Pt presenting to ED with Left leg pain and swelling from Left knee down to toes following  a fall  On June 7th.  Pt initially had an abrasion which has since healed.  Exam concerning for cellulitis.  Left leg is neurovascularly in tact. Discussed pt with Dr. Lacinda Axon who also examined pt.  Recommends admission for extensive cellulitis of Left leg, will tx with IV vancomycin.    11:02 PM Consulted with Dr. Roel Cluck, [facility], agreed to come examine pt for admission to med-surg bed.  Pt is stable at this time.   Noland Fordyce, PA-C 08/23/14 Carl, MD 08/23/14 [phone]

## 2014-08-23 NOTE — H&P (Signed)
PCP: Florina Ou, MD    Referring provider Erin PA   Chief Complaint:  Leg pain and swelling HPI: [name] [name] is a 74 y.o. male   has a past medical history of Coronary artery disease; Obstructive sleep apnea; Hyperlipidemia; and Chronic kidney disease.   Presented with left leg swelling. After he has injured it on June 7 patient fell down hitting the knee. Patient have been able to walk on it.  Since then he have had some redness and swelling in that leg. Leg feels warm. He has applied ACE bandage with some help.  He denies any fevers nor chills. He has had decreased movement over the past 2 weeks trying to keep the leg propped up. LE Doppler on 6/17was negative for DVT. Patient has NO history of diabetes. He presented to emergency department today because his leg was very hot and red. HE was found to have normal white blood cell count 5.9. He was given IV vancomycin 1 dose. Now the redness and warmth has improved.  Of note patient is history of chronic kidney disease although currently he has been doing well today's creatinine is 1.1 He has hx of CAD followed by D. [place]ique on Sttins and ASA 81mg . Denies any chest pain no shortness of breath.  Patient is history of sleep apnea uses CPAP machine.    Hospitalist was called for admission for cellulitis of lower extremity  Review of Systems:    Pertinent positives include: Leg swelling and pain  Constitutional:  No weight loss, night sweats, Fevers, chills, fatigue, weight loss  HEENT:  No headaches, Difficulty swallowing,Tooth/dental problems,Sore throat,  No sneezing, itching, ear ache, nasal congestion, post nasal drip,  Cardio-vascular:  No chest pain, Orthopnea, PND, anasarca, dizziness, palpitations.no Bilateral lower extremity swelling  GI:  No heartburn, indigestion, abdominal pain, nausea, vomiting, diarrhea, change in bowel habits, loss of appetite, melena, blood in stool, hematemesis Resp:  no shortness of breath at  rest. No dyspnea on exertion, No excess mucus, no productive cough, No non-productive cough, No coughing up of blood.No change in color of mucus.No wheezing. Skin:  no rash or lesions. No jaundice GU:  no dysuria, change in color of urine, no urgency or frequency. No straining to urinate.  No flank pain.  Musculoskeletal:  No joint pain or no joint swelling. No decreased range of motion. No back pain.  Psych:  No change in mood or affect. No depression or anxiety. No memory loss.  Neuro: no localizing neurological complaints, no tingling, no weakness, no double vision, no gait abnormality, no slurred speech, no confusion  Otherwise ROS are negative except for above, 10 systems were reviewed  Past Medical History: Past Medical History  Diagnosis Date  . Coronary artery disease     LHC 4/10: Mid LAD 40-50%, then 70%, OM1 20-30%, EF 65%. Mid LAD FFR 0.89 (not hemodynamically significant). Medical therapy was continued.  . Obstructive sleep apnea   . Hyperlipidemia   . Chronic kidney disease     mild insuffiency   Past Surgical History  Procedure Laterality Date  . Cardiac catheterization  05/02/2003    single vessel,moderate stenosis mid LAD  . Cardiac catheterization  06/12/2008    continue med. therapy  . Right shoulder surgery       Medications: Prior to Admission medications   Medication Sig Start Date End Date Taking? Authorizing Provider  Ascorbic Acid (VITAMIN C) 1000 MG tablet Take 1,000 mg by mouth daily.  Yes Historical Provider, MD  aspirin EC 81 MG tablet Take 81 mg by mouth daily.   Yes Historical Provider, MD  calcium-vitamin D (OSCAL WITH D) 500-200 MG-UNIT per tablet Take 1 tablet by mouth 2 (two) times daily.   Yes Historical Provider, MD  CRESTOR 10 MG tablet TAKE 1/2 TABLET BY MOUTH EVERY OTHER DAY 03/07/14  Yes Peter M Martinique, MD  diclofenac (VOLTAREN) 75 MG EC tablet Take 75 mg by mouth 2 (two) times daily.  08/07/14  Yes Historical Provider, MD  furosemide  (LASIX) 20 MG tablet Take 20 mg by mouth daily as needed for fluid (swelling). Take 20 mg daily if needed for swelling 04/29/14  Yes Peter M [place]ique, MD  Glucosamine HCl-Glucosamin SO4 (GLUCOSAMINE COMPLEX PO) Take 1 tablet by mouth daily.    Yes Historical Provider, MD  Multiple Vitamin (MULTIVITAMIN) tablet Take 1 tablet by mouth daily.     Yes Historical Provider, MD  Omega-3 Fatty Acids (FISH OIL) 600 MG CAPS Take 1 capsule by mouth 2 (two) times daily.    Yes Historical Provider, MD  OVER THE COUNTER MEDICATION Take 1 tablet by mouth 2 (two) times daily. G A M A (gets at [facility]).   Yes Historical Provider, MD  Psyllium (METAMUCIL PO) Take 5 mLs by mouth 2 (two) times daily.    Yes Historical Provider, MD  Saw Palmetto 450 MG CAPS Take 1 capsule by mouth 2 (two) times daily.    Yes Historical Provider, MD  Theanine 100 MG CAPS Take 100 mg by mouth daily.    Yes Historical Provider, MD  VALERIAN ROOT PO Take 1 tablet by mouth at bedtime as needed (muscle spasms).    Yes Historical Provider, MD    Allergies:   Allergies  Allergen Reactions  . Enablex [Darifenacin Hydrobromide Er]     PT STATES IT MAKES HIM "BONKERS" AND VERY TIRED  . Other     Intolerance to zetia and lipitor  . Oxybutynin Other (See Comments)    Memory impairment, temperament changes  . Sulfonamide Derivatives     Social History:  Ambulatory  independently had to use walker recently Lives at home With family     reports that he quit smoking about 36 years ago. His smoking use included Pipe. He has never used smokeless tobacco. He reports that he drinks alcohol. He reports that he uses illicit drugs.    Family History: family history includes Cancer in his mother; Hypertension in his father and mother; Other in his father.    Physical Exam: Patient Vitals for the past 24 hrs:  BP Temp Temp src Pulse Resp SpO2 Height Weight  08/23/14 2245 171/90 mmHg - - 69 18 98 % - -  08/23/14 2017 173/78 mmHg 97.4 F (36.3 C)  Oral 73 20 98 % 5\' 10"  (1.778 m) 100.699 kg (222 lb)    1. General:  in No Acute distress 2. Psychological: Alert and  Oriented 3. Head/ENT:   Moist Mucous Membranes                          Head Non traumatic, neck supple                          Normal    Dentition 4. SKIN: normal   Skin turgor,  Skin clean Dry and intact no rash left lower extremity swelling and redness 5. Heart: Regular rate and  rhythm no Murmur, Rub or gallop 6. Lungs: Clear to auscultation bilaterally, no wheezes or crackles   7. Abdomen: Soft, non-tender, Non distended 8. Lower extremities: no clubbing, cyanosis, edema right >left 9. Neurologically Grossly intact, moving all 4 extremities equally 10. MSK: Normal range of motion  body mass index is 31.85 kg/(m^2).   Labs on Admission:   Results for orders placed or performed during the hospital encounter of 08/23/14 (from the past 24 hour(s))  Basic metabolic panel     Status: Abnormal   Collection Time: 08/23/14 10:18 PM  Result Value Ref Range   Sodium 139 135 - 145 mmol/L   Potassium 4.3 3.5 - 5.1 mmol/L   Chloride 104 101 - 111 mmol/L   CO2 28 22 - 32 mmol/L   Glucose, Bld 116 (H) 65 - 99 mg/dL   BUN 24 (H) 6 - 20 mg/dL   Creatinine, Ser 1.14 0.61 - 1.24 mg/dL   Calcium 9.9 8.9 - 10.3 mg/dL   GFR calc non Af Amer >60 >60 mL/min   GFR calc Af Amer >60 >60 mL/min   Anion gap 7 5 - 15  CBC with Differential     Status: Abnormal   Collection Time: 08/23/14 10:18 PM  Result Value Ref Range   WBC 5.9 4.0 - 10.5 K/uL   RBC 4.00 (L) 4.22 - 5.81 MIL/uL   Hemoglobin 12.6 (L) 13.0 - 17.0 g/dL   HCT 37.5 (L) 39.0 - 52.0 %   MCV 93.8 78.0 - 100.0 fL   MCH 31.5 26.0 - 34.0 pg   MCHC 33.6 30.0 - 36.0 g/dL   RDW 12.7 11.5 - 15.5 %   Platelets 294 150 - 400 K/uL   Neutrophils Relative % 68 43 - 77 %   Neutro Abs 4.0 1.7 - 7.7 K/uL   Lymphocytes Relative 23 12 - 46 %   Lymphs Abs 1.4 0.7 - 4.0 K/uL   Monocytes Relative 6 3 - 12 %   Monocytes Absolute 0.4  0.1 - 1.0 K/uL   Eosinophils Relative 3 0 - 5 %   Eosinophils Absolute 0.2 0.0 - 0.7 K/uL   Basophils Relative 0 0 - 1 %   Basophils Absolute 0.0 0.0 - 0.1 K/uL    UA not obtained  No results found for: HGBA1C  Estimated Creatinine Clearance: 65.6 mL/min (by C-G formula based on Cr of 1.14).  BNP (last 3 results) No results for input(s): PROBNP in the last 8760 hours.  Other results:  I have pearsonaly reviewed this: ECG REPORT not obtained [facility] Weights   08/23/14 2017  Weight: 100.699 kg (222 lb)     Cultures: No results found for: SDES, SPECREQUEST, CULT, REPTSTATUS   Radiological Exams on Admission: No results found.  Chart has been reviewed  Family   at  Bedside  plan of care was discussed with   Daughter [phone] home Cell [phone] Assessment/Plan  74 year old male with history of coronary disease chronic kidney disease and obstructive sleep apnea presents with left leg swelling with recently negative lower extremity Doppler venous admitted for cellulitis for IV antibiotics  Present on Admission:  . Cellulitis - left lower extremity warmth and redness likely early stages of cellulitis given extensive involvement will treat with IV antibiotics back to patient to recover rapidly and be able to transition to by mouth antibiotics for discharged home given recent injury will obtain plain films says he have not been imaged prior to this. If it is no  significant improvement to benefit from discussion with orthopedics to see if the knee joint needs to be tapped. Doubt septic joint given the patient able to tolerate weight and able to passively and actively bend the knee. He does not appear to be toxic. Gout could be a possibility will obtain uric acid level . Obstructive sleep apnea - ordered C Pap  . Coronary atherosclerosis - stable continue aspirin  . Chronic kidney disease, stage 2, mildly decreased GFR - currently at baseline no evidence of acute renal failure   . elevated blood pressure patient states he feels anxious will need to reassess    Prophylaxis:  Lovenox   CODE STATUS:  FULL CODE  as per patient   Disposition:  To home once workup is complete and patient is stable  Other plan as per orders.  I have spent a total of 55 min on this admission  Pete Merten 08/23/2014, 11:13 PM  [facility]  Pager [phone]   after 2 AM please page floor coverage PA If 7AM-7PM, please contact the day team taking care of the patient  [email or web address]  Password [email or web address]

## 2014-08-23 NOTE — ED Notes (Signed)
Pt arrived with a complaint of leg swelling.  Pt had an injury on the 7th of June and was advised that he had a contusion. Pt states that the swelling and redness have not subsided.  Pt states the leg is also warm.   Pt is not on blood thinners but does take a 81 mg asprin

## 2014-08-24 ENCOUNTER — Emergency Department (HOSPITAL_COMMUNITY): Payer: Medicare Other

## 2014-08-24 ENCOUNTER — Encounter (HOSPITAL_COMMUNITY): Payer: Self-pay | Admitting: *Deleted

## 2014-08-24 DIAGNOSIS — N182 Chronic kidney disease, stage 2 (mild): Secondary | ICD-10-CM

## 2014-08-24 DIAGNOSIS — I2510 Atherosclerotic heart disease of native coronary artery without angina pectoris: Secondary | ICD-10-CM

## 2014-08-24 DIAGNOSIS — E785 Hyperlipidemia, unspecified: Secondary | ICD-10-CM | POA: Diagnosis present

## 2014-08-24 DIAGNOSIS — L03116 Cellulitis of left lower limb: Principal | ICD-10-CM

## 2014-08-24 DIAGNOSIS — Z7982 Long term (current) use of aspirin: Secondary | ICD-10-CM | POA: Diagnosis not present

## 2014-08-24 DIAGNOSIS — M79605 Pain in left leg: Secondary | ICD-10-CM | POA: Diagnosis present

## 2014-08-24 DIAGNOSIS — M7989 Other specified soft tissue disorders: Secondary | ICD-10-CM | POA: Diagnosis not present

## 2014-08-24 DIAGNOSIS — Z79899 Other long term (current) drug therapy: Secondary | ICD-10-CM | POA: Diagnosis not present

## 2014-08-24 DIAGNOSIS — G4733 Obstructive sleep apnea (adult) (pediatric): Secondary | ICD-10-CM | POA: Diagnosis present

## 2014-08-24 DIAGNOSIS — Z87891 Personal history of nicotine dependence: Secondary | ICD-10-CM | POA: Diagnosis not present

## 2014-08-24 DIAGNOSIS — Z8249 Family history of ischemic heart disease and other diseases of the circulatory system: Secondary | ICD-10-CM | POA: Diagnosis not present

## 2014-08-24 LAB — CBC
HCT: 36.7 % — ABNORMAL LOW (ref 39.0–52.0)
HEMOGLOBIN: 12.1 g/dL — AB (ref 13.0–17.0)
MCH: 30.9 pg (ref 26.0–34.0)
MCHC: 33 g/dL (ref 30.0–36.0)
MCV: 93.6 fL (ref 78.0–100.0)
Platelets: 287 10*3/uL (ref 150–400)
RBC: 3.92 MIL/uL — AB (ref 4.22–5.81)
RDW: 12.9 % (ref 11.5–15.5)
WBC: 6.4 10*3/uL (ref 4.0–10.5)

## 2014-08-24 LAB — COMPREHENSIVE METABOLIC PANEL
ALT: 25 U/L (ref 17–63)
AST: 20 U/L (ref 15–41)
Albumin: 3.6 g/dL (ref 3.5–5.0)
Alkaline Phosphatase: 68 U/L (ref 38–126)
Anion gap: 6 (ref 5–15)
BUN: 24 mg/dL — AB (ref 6–20)
CALCIUM: 9.2 mg/dL (ref 8.9–10.3)
CHLORIDE: 106 mmol/L (ref 101–111)
CO2: 28 mmol/L (ref 22–32)
Creatinine, Ser: 1.15 mg/dL (ref 0.61–1.24)
GFR calc Af Amer: >60 mL/min (ref >60)
GFR, EST NON AFRICAN AMERICAN: 60 mL/min — AB (ref >60)
GLUCOSE: 174 mg/dL — AB (ref 65–99)
POTASSIUM: 3.7 mmol/L (ref 3.5–5.1)
Sodium: 140 mmol/L (ref 135–145)
TOTAL PROTEIN: 6.7 g/dL (ref 6.5–8.1)
Total Bilirubin: 0.5 mg/dL (ref 0.3–1.2)

## 2014-08-24 LAB — PHOSPHORUS: PHOSPHORUS: 2.6 mg/dL (ref 2.5–4.6)

## 2014-08-24 LAB — URIC ACID: Uric Acid, Serum: 7.2 mg/dL (ref 4.4–7.6)

## 2014-08-24 LAB — MAGNESIUM: Magnesium: 2.2 mg/dL (ref 1.7–2.4)

## 2014-08-24 LAB — TSH: TSH: 3.437 u[IU]/mL (ref 0.350–4.500)

## 2014-08-24 MED ORDER — ACETAMINOPHEN 650 MG RE SUPP: 650.0000 mg | Freq: Four times a day (QID) | RECTAL | Status: DC | PRN

## 2014-08-24 MED ORDER — HYDROCODONE-ACETAMINOPHEN 5-325 MG PO TABS: 1.0000 | ORAL_TABLET | ORAL | Status: DC | PRN

## 2014-08-24 MED ORDER — ENOXAPARIN SODIUM 40 MG/0.4ML ~~LOC~~ SOLN
40.0000 mg | SUBCUTANEOUS | Status: DC
  Administered 2014-08-24: 40 mg via SUBCUTANEOUS
  Filled 2014-08-24 (×2): qty 0.4

## 2014-08-24 MED ORDER — ONDANSETRON HCL 4 MG PO TABS: 4.0000 mg | ORAL_TABLET | Freq: Four times a day (QID) | ORAL | Status: DC | PRN

## 2014-08-24 MED ORDER — DICLOFENAC SODIUM 75 MG PO TBEC
75.0000 mg | DELAYED_RELEASE_TABLET | Freq: Two times a day (BID) | ORAL | Status: DC
  Administered 2014-08-24 – 2014-08-25 (×3): 75 mg via ORAL
  Filled 2014-08-24 (×4): qty 1

## 2014-08-24 MED ORDER — ASPIRIN EC 81 MG PO TBEC
81.0000 mg | DELAYED_RELEASE_TABLET | Freq: Every day | ORAL | Status: DC
Start: 2014-08-24 — End: 2014-08-25
  Administered 2014-08-24 – 2014-08-25 (×2): 81 mg via ORAL
  Filled 2014-08-24 (×2): qty 1

## 2014-08-24 MED ORDER — ACETAMINOPHEN 325 MG PO TABS: 650.0000 mg | ORAL_TABLET | Freq: Four times a day (QID) | ORAL | Status: DC | PRN

## 2014-08-24 MED ORDER — VANCOMYCIN HCL IN DEXTROSE 1-5 GM/200ML-% IV SOLN
1000.0000 mg | Freq: Two times a day (BID) | INTRAVENOUS | Status: DC
  Administered 2014-08-24 (×2): 1000 mg via INTRAVENOUS
  Filled 2014-08-24 (×3): qty 200

## 2014-08-24 MED ORDER — SODIUM CHLORIDE 0.9 % IJ SOLN
3.0000 mL | Freq: Two times a day (BID) | INTRAMUSCULAR | Status: DC
  Administered 2014-08-24 – 2014-08-25 (×4): 3 mL via INTRAVENOUS

## 2014-08-24 MED ORDER — SODIUM CHLORIDE 0.9 % IJ SOLN: 3.0000 mL | INTRAMUSCULAR | Status: DC | PRN

## 2014-08-24 MED ORDER — ROSUVASTATIN CALCIUM 5 MG PO TABS
5.0000 mg | ORAL_TABLET | ORAL | Status: DC
  Administered 2014-08-24: 5 mg via ORAL
  Filled 2014-08-24: qty 1

## 2014-08-24 MED ORDER — ONDANSETRON HCL 4 MG/2ML IJ SOLN: 4.0000 mg | Freq: Four times a day (QID) | INTRAMUSCULAR | Status: DC | PRN

## 2014-08-24 MED ORDER — SODIUM CHLORIDE 0.9 % IV SOLN: 250.0000 mL | INTRAVENOUS | Status: DC | PRN

## 2014-08-24 NOTE — Progress Notes (Signed)
Patient ID: [name] [name], male   DOB: [birth year], 74 y.o.   MRN: [id number] [facility] PROGRESS NOTE  TYR FRANCA NFA:[id number] DOB: June 21, 1940 DOA: 08/23/2014 PCP: Florina Ou, MD  Brief narrative:    74 y.o. male with past medical history of coronary artery disease, dyslipidemia, CKD stage 2 who presented to [facility] ED with worsening left lower extremity redness, swelling and pain for past couple of days prior to this admission. He fell on 08/05/14 and ever since he reported difficulty bearing weight on this leg. He has had lower extremity doppler 6/17 which was negative for DVT. On this admission, he was started on empiric vancomycin.   Assessment/Plan:    Principal Problem:   Cellulitis, left lower extremity - Improving with vancomycin - Supportive care for pain   Active Problems:   Hyperlipidemia - Continue statin therapy    Coronary atherosclerosis - Continue aspirin    Chronic kidney disease, stage 2, mildly decreased GFR - Stable    DVT Prophylaxis  - Lovenox subQ ordered    Code Status: Full.  Family Communication:  plan of care discussed with the patient Disposition Plan: Home likely by 08/26/14.   IV access:  Peripheral IV  Procedures and diagnostic studies:    Dg Knee 1-2 Views Left 08/24/2014  Mild degenerative changes with prepatellar soft tissue swelling.     Dg Tibia/fibula Left 08/24/2014  Negative.     Medical Consultants:  None   Other Consultants:  None   IAnti-Infectives:   Vanco 08/23/2014 -->   Dannica Bickham, MD  [facility] Pager [phone]  Time spent in minutes: 15 minutes  If 7PM-7AM, please contact night-coverage [email or web address] Password [email or web address] 08/24/2014, 11:10 AM   LOS: 0 days    HPI/Subjective: No acute overnight events. Patient reports redness in left leg slightly better.  Objective: Filed Vitals:   08/23/14 2330 08/24/14 0100 08/24/14 0550 08/24/14 0803  BP: 179/94 161/91 116/59 125/64  Pulse:  76 72 62   Temp:  97.7 F (36.5 C) 97.4 F (36.3 C) 97.6 F (36.4 C)  TempSrc:  Oral Axillary Oral  Resp:  20 18 18   Height:  5\' 10"  (1.778 m)    Weight:  98.4 kg (216 lb 14.9 oz)    SpO2:  98% 97% 97%    Intake/Output Summary (Last 24 hours) at 08/24/14 1110 Last data filed at 08/24/14 2841  Gross per 24 hour  Intake    603 ml  Output   2600 ml  Net  -1997 ml    Exam:   General:  Pt is alert, follows commands appropriately, not in acute distress  Cardiovascular: Regular rate and rhythm, S1/S2, no murmurs  Respiratory: Clear to auscultation bilaterally, no wheezing, no crackles, no rhonchi  Abdomen: Soft, non tender, non distended, bowel sounds present  Extremities: left leg cellulitis appreciated, pulses DP and PT palpable bilaterally  Neuro: Grossly nonfocal  Data Reviewed: Basic Metabolic Panel:  Recent Labs Lab 08/23/14 2218 08/24/14 0504  NA 139 140  K 4.3 3.7  CL 104 106  CO2 28 28  GLUCOSE 116* 174*  BUN 24* 24*  CREATININE 1.14 1.15  CALCIUM 9.9 9.2  MG  --  2.2  PHOS  --  2.6   Liver Function Tests:  Recent Labs Lab 08/24/14 0504  AST 20  ALT 25  ALKPHOS 68  BILITOT 0.5  PROT 6.7  ALBUMIN 3.6   No results for input(s): LIPASE, AMYLASE in the last 168 hours. No results  for input(s): AMMONIA in the last 168 hours. CBC:  Recent Labs Lab 08/23/14 2218 08/24/14 0504  WBC 5.9 6.4  NEUTROABS 4.0  --   HGB 12.6* 12.1*  HCT 37.5* 36.7*  MCV 93.8 93.6  PLT 294 287   Cardiac Enzymes: No results for input(s): CKTOTAL, CKMB, CKMBINDEX, TROPONINI in the last 168 hours. BNP: Invalid input(s): POCBNP CBG: No results for input(s): GLUCAP in the last 168 hours.  No results found for this or any previous visit (from the past 240 hour(s)).   Scheduled Meds: . aspirin EC  81 mg Oral Daily  . diclofenac  75 mg Oral BID  . enoxaparin (LOVENOX) injection  40 mg Subcutaneous Q24H  . rosuvastatin  5 mg Oral QODAY  . vancomycin  1,000 mg Intravenous  Q12H

## 2014-08-24 NOTE — Progress Notes (Signed)
ANTIBIOTIC CONSULT NOTE - INITIAL  Pharmacy Consult for vancomycin Indication: cellulitis  Allergies  Allergen Reactions  . Enablex [Darifenacin Hydrobromide Er]     PT STATES IT MAKES HIM "BONKERS" AND VERY TIRED  . Other     Intolerance to zetia and lipitor  . Oxybutynin Other (See Comments)    Memory impairment, temperament changes  . Sulfonamide Derivatives     Patient Measurements: Height: 5\' 10"  (177.8 cm) Weight: 216 lb 14.9 oz (98.4 kg) IBW/kg (Calculated) : 73 Adjusted Body Weight:   Vital Signs: Temp: 97.7 F (36.5 C) (06/26 0100) Temp Source: Oral (06/26 0100) BP: 161/91 mmHg (06/26 0100) Pulse Rate: 76 (06/26 0100) Intake/Output from previous day: 06/25 0701 - 06/26 0700 In: -  Out: 800 [Urine:800] Intake/Output from this shift: Total I/O In: -  Out: 800 [Urine:800]  Labs:  Recent Labs  08/23/14 2218  WBC 5.9  HGB 12.6*  PLT 294  CREATININE 1.14   Estimated Creatinine Clearance: 64.9 mL/min (by C-G formula based on Cr of 1.14). No results for input(s): VANCOTROUGH, VANCOPEAK, VANCORANDOM, GENTTROUGH, GENTPEAK, GENTRANDOM, TOBRATROUGH, TOBRAPEAK, TOBRARND, AMIKACINPEAK, AMIKACINTROU, AMIKACIN in the last 72 hours.   Microbiology: No results found for this or any previous visit (from the past 720 hour(s)).  Medical History: Past Medical History  Diagnosis Date  . Coronary artery disease     LHC 4/10: Mid LAD 40-50%, then 70%, OM1 20-30%, EF 65%. Mid LAD FFR 0.89 (not hemodynamically significant). Medical therapy was continued.  . Obstructive sleep apnea   . Hyperlipidemia   . Chronic kidney disease     mild insuffiency    Medications:  Anti-infectives    Start     Dose/Rate Route Frequency Ordered Stop   08/24/14 1000  vancomycin (VANCOCIN) IVPB 1000 mg/200 mL premix     1,000 mg 200 mL/hr over 60 Minutes Intravenous Every 12 hours 08/24/14 0118     08/23/14 2215  vancomycin (VANCOCIN) IVPB 1000 mg/200 mL premix     1,000 mg 200 mL/hr  over 60 Minutes Intravenous  Once 08/23/14 2200 08/23/14 2351     Assessment: Patient with left leg cellulitis.  First dose of antibiotics already given.    Goal of Therapy:  Vancomycin trough level 10-15 mcg/ml  Plan:  Measure antibiotic drug levels at steady state Follow up culture results  Vancomycin 1gm iv q12hr  [name] [name], [name] [name] 08/24/2014,1:18 AM

## 2014-08-24 NOTE — Progress Notes (Signed)
Patient brought home CPAP from home. Patient is on home setting. RT placed water in water chamber for humidification. Encouraged Pt. To call is he has any problems.

## 2014-08-25 DIAGNOSIS — M7989 Other specified soft tissue disorders: Secondary | ICD-10-CM | POA: Insufficient documentation

## 2014-08-25 DIAGNOSIS — L03116 Cellulitis of left lower limb: Secondary | ICD-10-CM | POA: Insufficient documentation

## 2014-08-25 MED ORDER — DOXYCYCLINE MONOHYDRATE 100 MG PO TABS: 100.0000 mg | ORAL_TABLET | Freq: Two times a day (BID) | ORAL | Status: DC

## 2014-08-25 NOTE — Discharge Instructions (Signed)

## 2014-08-25 NOTE — Discharge Summary (Signed)
Physician Discharge Summary  [name] [name] WLS:[id number] DOB: 1940/05/24 DOA: 08/23/2014  PCP: Florina Ou, MD  Admit date: 08/23/2014 Discharge date: 08/25/2014  Recommendations for Outpatient Follow-up:  1. Pt will continue doxycycline for 10 days on discharge   Discharge Diagnoses:  Principal Problem:   Cellulitis Active Problems:   Hyperlipidemia   Coronary atherosclerosis   Chronic kidney disease, stage 2, mildly decreased GFR    Discharge Condition: stable   Diet recommendation: as tolerated   History of present illness:  74 y.o. male with past medical history of coronary artery disease, dyslipidemia, CKD stage 2 who presented to [facility] ED with worsening left lower extremity redness, swelling and pain for past couple of days prior to this admission. He fell on 08/05/14 and ever since he reported difficulty bearing weight on this leg. He has had lower extremity doppler 6/17 which was negative for DVT. On this admission, he was started on empiric vancomycin.   Hospital Course:    Assessment/Plan:    Principal Problem:  Cellulitis, left lower extremity - Improving with vancomycin. He is stable for D/C today with doxycycline for 10 days on discharge   Active Problems:  Hyperlipidemia - Continue statin therapy   Coronary atherosclerosis - Continue aspirin on discharge    Chronic kidney disease, stage 2, mildly decreased GFR - Stable    DVT Prophylaxis  - Lovenox subQ orderedin hospital    Code Status: Full.  Family Communication: plan of care discussed with the patient.   IV access:  Peripheral IV  Procedures and diagnostic studies:   Dg Knee 1-2 Views Left 08/24/2014 Mild degenerative changes with prepatellar soft tissue swelling.   Dg Tibia/fibula Left 08/24/2014 Negative.   Medical Consultants:  None   Other Consultants:  None   IAnti-Infectives:   Vanco 08/23/2014 --> 08/25/2014   Signed:  Leisa Lenz, MD  [facility] 08/25/2014, 9:31 AM  Pager #: [phone]  Time spent in minutes: more than 30 minutes   Discharge Exam: Filed Vitals:   08/25/14 0534  BP: 139/82  Pulse: 67  Temp: 97.8 F (36.6 C)  Resp: 18   Filed Vitals:   08/24/14 1349 08/24/14 2200 08/25/14 0200 08/25/14 0534  BP: 132/64 130/71 120/66 139/82  Pulse: 69 77 63 67  Temp: 98.1 F (36.7 C) 98.2 F (36.8 C) 97.4 F (36.3 C) 97.8 F (36.6 C)  TempSrc: Oral Oral Oral Oral  Resp: 17 18 18 18   Height:      Weight:      SpO2: 96% 96% 97% 98%    General: Pt is alert, follows commands appropriately, not in acute distress Cardiovascular: Regular rate and rhythm, S1/S2 +, no murmurs Respiratory: Clear to auscultation bilaterally, no wheezing, no crackles, no rhonchi Abdominal: Soft, non tender, non distended, bowel sounds +, no guarding Extremities: LLE cellulitis improving with less redness and no pain to palpation, no cyanosis, pulses palpable bilaterally DP and PT Neuro: Grossly nonfocal  Discharge Instructions  Discharge Instructions    Call MD for:  difficulty breathing, headache or visual disturbances    Complete by:  As directed      Call MD for:  persistant nausea and vomiting    Complete by:  As directed      Call MD for:  severe uncontrolled pain    Complete by:  As directed      Diet - low sodium heart healthy    Complete by:  As directed      Discharge instructions  Complete by:  As directed   1. Continue doxycycline 100 mg twice a day for 10 days for cellulitis     Increase activity slowly    Complete by:  As directed             Medication List    TAKE these medications        aspirin EC 81 MG tablet  Take 81 mg by mouth daily.     calcium-vitamin D 500-200 MG-UNIT per tablet  Commonly known as:  OSCAL WITH D  Take 1 tablet by mouth 2 (two) times daily.     CRESTOR 10 MG tablet  Generic drug:  rosuvastatin  TAKE 1/2 TABLET BY MOUTH EVERY OTHER DAY     diclofenac 75 MG EC  tablet  Commonly known as:  VOLTAREN  Take 75 mg by mouth 2 (two) times daily.     doxycycline 100 MG tablet  Commonly known as:  ADOXA  Take 1 tablet (100 mg total) by mouth 2 (two) times daily.     Fish Oil 600 MG Caps  Take 1 capsule by mouth 2 (two) times daily.     furosemide 20 MG tablet  Commonly known as:  LASIX  Take 20 mg by mouth daily as needed for fluid (swelling). Take 20 mg daily if needed for swelling     GLUCOSAMINE COMPLEX PO  Take 1 tablet by mouth daily.     METAMUCIL PO  Take 5 mLs by mouth 2 (two) times daily.     multivitamin tablet  Take 1 tablet by mouth daily.     OVER THE COUNTER MEDICATION  Take 1 tablet by mouth 2 (two) times daily. G A M A (gets at [facility]).     Saw Palmetto 450 MG Caps  Take 1 capsule by mouth 2 (two) times daily.     Theanine 100 MG Caps  Take 100 mg by mouth daily.     VALERIAN ROOT PO  Take 1 tablet by mouth at bedtime as needed (muscle spasms).     vitamin C 1000 MG tablet  Take 1,000 mg by mouth daily.           Follow-up Information    Follow up with Florina Ou, MD. Schedule an appointment as soon as possible for a visit in 2 weeks.   Specialty:  Family Medicine   Why:  Follow up appt after recent hospitalization   Contact information:   [address] [place] [id number] [phone]        The results of significant diagnostics from this hospitalization (including imaging, microbiology, ancillary and laboratory) are listed below for reference.    Significant Diagnostic Studies: Dg Knee 1-2 Views Left  09/15/2014   CLINICAL DATA:  Left leg swelling  EXAM: LEFT KNEE - 1-2 VIEW  COMPARISON:  None.  FINDINGS: No fracture or dislocation is seen.  Mild degenerative changes in the lateral and patellofemoral compartments.  Mild prepatellar soft tissue swelling.  No suprapatellar knee joint effusion.  IMPRESSION: Mild degenerative changes with prepatellar soft tissue swelling.    Electronically Signed   By: Julian Hy M.D.   On: Sep 15, 2014 00:56   Dg Tibia/fibula Left  15-Sep-2014   CLINICAL DATA:  Left leg swelling  EXAM: LEFT TIBIA AND FIBULA - 2 VIEW  COMPARISON:  None.  FINDINGS: No fracture or dislocation is seen.  The joint spaces are preserved.  The visualized soft tissues are unremarkable.  IMPRESSION: Negative.   Electronically Signed   By: Julian Hy M.D.   On: 08/24/2014 00:57   Dg Knee Ap/lat W/sunrise Left  08/07/2014   CLINICAL DATA:  Left knee pain following fall, initial encounter  EXAM: LEFT KNEE 3 VIEWS  COMPARISON:  None.  FINDINGS: Mild spurring is noted from the superior aspect of the patella. Mild joint space narrowing is noted laterally with osteophytic change. No joint effusion or acute fracture is noted.  IMPRESSION: Degenerative change without acute abnormality.   Electronically Signed   By: Inez Catalina M.D.   On: 08/07/2014 08:14    Microbiology: No results found for this or any previous visit (from the past 240 hour(s)).   Labs: Basic Metabolic Panel:  Recent Labs Lab 08/23/14 2218 08/24/14 0504  NA 139 140  K 4.3 3.7  CL 104 106  CO2 28 28  GLUCOSE 116* 174*  BUN 24* 24*  CREATININE 1.14 1.15  CALCIUM 9.9 9.2  MG  --  2.2  PHOS  --  2.6   Liver Function Tests:  Recent Labs Lab 08/24/14 0504  AST 20  ALT 25  ALKPHOS 68  BILITOT 0.5  PROT 6.7  ALBUMIN 3.6   No results for input(s): LIPASE, AMYLASE in the last 168 hours. No results for input(s): AMMONIA in the last 168 hours. CBC:  Recent Labs Lab 08/23/14 2218 08/24/14 0504  WBC 5.9 6.4  NEUTROABS 4.0  --   HGB 12.6* 12.1*  HCT 37.5* 36.7*  MCV 93.8 93.6  PLT 294 287   Cardiac Enzymes: No results for input(s): CKTOTAL, CKMB, CKMBINDEX, TROPONINI in the last 168 hours. BNP: BNP (last 3 results) No results for input(s): BNP in the last 8760 hours.  ProBNP (last 3 results) No results for input(s): PROBNP in the last 8760 hours.  CBG: No  results for input(s): GLUCAP in the last 168 hours.

## 2014-08-26 ENCOUNTER — Ambulatory Visit (INDEPENDENT_AMBULATORY_CARE_PROVIDER_SITE_OTHER): Payer: Medicare Other | Admitting: Family Medicine

## 2014-08-26 ENCOUNTER — Encounter: Payer: Self-pay | Admitting: Family Medicine

## 2014-08-26 VITALS — BP 164/80 | HR 80 | Temp 98.3°F | Resp 16 | Ht 69.0 in | Wt 220.0 lb

## 2014-08-26 DIAGNOSIS — L03116 Cellulitis of left lower limb: Secondary | ICD-10-CM | POA: Diagnosis not present

## 2014-08-26 DIAGNOSIS — Z09 Encounter for follow-up examination after completed treatment for conditions other than malignant neoplasm: Secondary | ICD-10-CM | POA: Diagnosis not present

## 2014-08-26 NOTE — Patient Instructions (Signed)

## 2014-08-28 ENCOUNTER — Telehealth: Payer: Self-pay

## 2014-08-28 NOTE — Telephone Encounter (Signed)
Pt calling to speak with PA Carlean Purl, he states his symptoms form OV yesterday have not gotten any better. Please advise

## 2014-08-28 NOTE — Progress Notes (Signed)
Subjective:    Patient ID: [name] [name], male    DOB: 1940/10/30, 74 y.o.   MRN: [id number]  HPI This is a very pleasant 74 yo male who is brought in by his daughter for hospital follow up.  He was admitted 08/23/14- 08/25/14 for cellulitis of his left lower leg. He had fallen 08/05/14 and was seen at [facility] 102 08/06/14 and was diagnosed with contusion of left leg following normal xray and aspiration with minimal aspirate.  He was given knee immobilizer and diclofenac. He presented to the ER with worsening pain/redness and swelling 08/23/14. He was admitted and given IV Vancomycin. Doppler US negative for DVT. He is currently taking doxycycline 100 mg po BID and tolerating without side effects. He has needed a walker to help with ambulation since his hospitalization.  He presents today with concern about leg swelling and continued redness. His daughter has pictures of the patient's leg since his fall. He has not been wrapping leg and he has been out of bed considerably more since he was discharged from the hospital in the last 24 hours.   Past Medical History  Diagnosis Date  . Coronary artery disease     LHC 4/10: Mid LAD 40-50%, then 70%, OM1 20-30%, EF 65%. Mid LAD FFR 0.89 (not hemodynamically significant). Medical therapy was continued.  . Obstructive sleep apnea   . Hyperlipidemia   . Chronic kidney disease     mild insuffiency   Past Surgical History  Procedure Laterality Date  . Cardiac catheterization  05/02/2003    single vessel,moderate stenosis mid LAD  . Cardiac catheterization  06/12/2008    continue med. therapy  . Right shoulder surgery     Family History  Problem Relation Age of Onset  . Cancer Mother   . Hypertension Mother   . Other Father     old age  . Hypertension Father    History  Substance Use Topics  . Smoking status: Former Smoker -- 40 years    Types: Pipe    Quit date: 02/28/1978  . Smokeless tobacco: Never Used     Comment: smoked pipe only   . Alcohol  Use: No     Comment: rarely wine   Medications, allergies, past medical history, surgical history, family history, social history and problem list reviewed and updated.   Review of Systems No fever, no chills, no chest pain, no SOB    Objective:   Physical Exam  Constitutional: He is oriented to person, place, and time. He appears well-developed and well-nourished. No distress.  obese  HENT:  Head: Normocephalic and atraumatic.  Eyes: Conjunctivae are normal.  Neck: Normal range of motion. Neck supple.  Cardiovascular: Normal rate, regular rhythm and normal heart sounds.   Pulmonary/Chest: Effort normal and breath sounds normal.  Musculoskeletal: He exhibits no tenderness.       Left upper leg: He exhibits swelling and edema.       Left lower leg: He exhibits swelling and edema.  Skin marker used to indicate measurement sites. Copy of readings given to patient's daughter for reference as was skin marker and disposable measuring tape.  Measurement #1- 52 cm (10 cm above knee) Measurement #2- 41.75 cm (6 cm below patella) Measurement #3- 34 cm ( 21 cm below patella).  Neurological: He is alert and oriented to person, place, and time.  Skin: Skin is warm and dry. He is not diaphoretic.  Psychiatric: He has a normal mood and affect. His behavior  is normal. Judgment and thought content normal.  Vitals reviewed.  BP 164/80 mmHg  Pulse 80  Temp(Src) 98.3 F (36.8 C)  Resp 16  Ht 5\' 9"  (1.753 m)  Wt 220 lb (99.791 kg)  BMI 32.47 kg/m2  SpO2 97% Wt Readings from Last 3 Encounters:  08/26/14 220 lb (99.791 kg)  08/24/14 216 lb 14.9 oz (98.4 kg)  08/15/14 223 lb 3 oz (101.237 kg)      Assessment & Plan:  1. Cellulitis of left lower leg - Provided written and verbal information regarding diagnosis and treatment. - Encouraged patient to keep leg elevated as much as possible - can use mild compression for comfort - Follow up in 1 week, sooner if worsening  symptoms/fever/pain  2. Hospital discharge follow-up - Finish doxycycline as prescribed   Clarene Reamer, FNP-BC  [facility], [facility]  08/28/2014 11:46 PM

## 2014-08-29 NOTE — Telephone Encounter (Signed)
Called and spoke with patient and his daughter. His left leg continues to be warm. There is no increase in swelling or erythema. He has not had fever or chills. It is uncomfortable if he goes long time without elevating it. He slept well last night and his leg feels a little better this morning. His daughter is going to repeat the measurements to see if there is an increase in size. He saw a Restaurant manager, fast food and alternative medicine practitioner on Wed. I have advised him to come in and be checked if increased swelling, redness or fever.

## 2014-09-02 ENCOUNTER — Ambulatory Visit (INDEPENDENT_AMBULATORY_CARE_PROVIDER_SITE_OTHER): Payer: Medicare Other | Admitting: Emergency Medicine

## 2014-09-02 ENCOUNTER — Encounter: Payer: Self-pay | Admitting: Family Medicine

## 2014-09-02 VITALS — BP 150/70 | HR 78 | Temp 97.7°F | Resp 16 | Wt 219.0 lb

## 2014-09-02 DIAGNOSIS — R600 Localized edema: Secondary | ICD-10-CM

## 2014-09-02 DIAGNOSIS — L03116 Cellulitis of left lower limb: Secondary | ICD-10-CM | POA: Diagnosis not present

## 2014-09-02 MED ORDER — DOXYCYCLINE MONOHYDRATE 100 MG PO TABS: 100.0000 mg | ORAL_TABLET | Freq: Two times a day (BID) | ORAL | Status: DC

## 2014-09-02 NOTE — Progress Notes (Signed)
   Subjective:    Patient ID: [name] [name], male    DOB: 11/10/1940, 74 y.o.   MRN: [id number]  HPI This is a very pleasant 74 yo male who presents today for follow up of left LE cellulitis.  The patient was admitted 08/23/14- 08/25/14 for cellulitis of his left lower leg. He had fallen 08/05/14 and was seen at [facility] 102 08/06/14 and was diagnosed with contusion of left leg following normal xray and aspiration with minimal aspirate. He was given knee immobilizer and diclofenac. He presented to the ER with worsening pain/redness and swelling 08/23/14. He was admitted and given IV Vancomycin. Doppler US 08/15/14 negative for DVT. He is currently taking doxycycline 100 mg po BID and tolerating without side effects. He has one more day remaining. Since his last office visit 7 days ago, he has not noticed significant decrease in swelling of his leg. His skin is peeling. It is less of a dark red, but continues to be red. It feels "tight," but does not hurt.  He is ambulating better and energy is returning. He would like to get back to his normal activities. [place] and his daughter has been doing the mowing. He was able to drive himself to his appointment today. He has been using a walker since hospital discharge, but forgot it today.   Review of Systems No fever/chills, no chest pain, no SOB.    Objective:   Physical Exam  Constitutional: He is oriented to person, place, and time. He appears well-developed and well-nourished.  Obese   HENT:  Head: Normocephalic and atraumatic.  Eyes: Conjunctivae are normal.  Neck: Normal range of motion. Neck supple.  Cardiovascular: Normal rate.   Pulmonary/Chest: Effort normal.  Musculoskeletal: He exhibits edema.  Left leg with continued edema from lower thigh to foot. Some decrease in redness. Measurements unchanged from last week. Left PT/DP palpable. No masses/cord palpated in calf. Skin from knee distal to foot is very tight. ROM of knee limited by  edema. No warmth. Skin peeling.    Neurological: He is alert and oriented to person, place, and time.  Skin: Skin is warm and dry.  Psychiatric: He has a normal mood and affect. His behavior is normal. Judgment and thought content normal.  Vitals reviewed.  BP 150/70 mmHg  Pulse 78  Temp(Src) 97.7 F (36.5 C) (Oral)  Resp 16  Wt 219 lb (99.338 kg)  Wt Readings from Last 3 Encounters:  09/02/14 219 lb (99.338 kg)  08/26/14 220 lb (99.791 kg)  08/24/14 216 lb 14.9 oz (98.4 kg)      Assessment & Plan:  Discussed with Dr. Everlene Farrier who also examined the patient  1. Cellulitis of left lower leg - will continue doxy since little improvement in edema - doxycycline (ADOXA) 100 MG tablet; Take 1 tablet (100 mg total) by mouth 2 (two) times daily.  Dispense: 20 tablet; Refill: 0 - patient reminded to RTC if he develops worsening swelling, pain, fever/chills, SOB - Follow up in 1 week - continue to limit the amount of time he is up on his feet. Elevate whenever possible.   2. Leg edema, left - VAS [place] LOWER EXTREMITY VENOUS (DVT); Future   Clarene Reamer, FNP-BC  [facility], [facility]  09/02/2014 9:49 PM

## 2014-09-02 NOTE — Patient Instructions (Signed)
I have sent a new prescription for the same antibiotic for you to continue until your next visit Elevate your leg as much as possible.  Let us know if you develop a fever, chills, shortness of breath or more swelling.   You will get a call to schedule your ultrasound of your leg.

## 2014-09-05 ENCOUNTER — Other Ambulatory Visit: Payer: Self-pay | Admitting: Physician Assistant

## 2014-09-08 ENCOUNTER — Ambulatory Visit (HOSPITAL_COMMUNITY): Payer: Medicare Other | Attending: Internal Medicine

## 2014-09-08 DIAGNOSIS — R600 Localized edema: Secondary | ICD-10-CM | POA: Insufficient documentation

## 2014-09-09 ENCOUNTER — Ambulatory Visit (INDEPENDENT_AMBULATORY_CARE_PROVIDER_SITE_OTHER): Payer: Medicare Other | Admitting: Emergency Medicine

## 2014-09-09 ENCOUNTER — Encounter: Payer: Self-pay | Admitting: Family Medicine

## 2014-09-09 VITALS — BP 144/62 | HR 80 | Temp 98.0°F | Resp 18 | Wt 219.6 lb

## 2014-09-09 DIAGNOSIS — L03116 Cellulitis of left lower limb: Secondary | ICD-10-CM

## 2014-09-09 NOTE — Progress Notes (Signed)
   Subjective:    Patient ID: [name] [name], male    DOB: 01-08-1941, 74 y.o.   MRN: [id number]  HPI This is a very pleasant 74 yo male who presents for 1 week follow up of left lower extremity cellulitis. He reports that the leg is slowly getting better. He is anxious to be able to be and around more, but does notice increased swelling if he is up for very long. Leg continues to peel and feels "tight," but does not have pain. He continues to use a walker for balance; no further falls.  Tolerating doxycycline 100 mg po BID without side effects.  Had doppler US yesterday which was negative for DVT.  He has an appointment to establish care with a new PCP at [facility] in 6 days.   Review of Systems No fever/chills, no chest pain, no SOB    Objective:   Physical Exam  Constitutional: He is oriented to person, place, and time. He appears well-developed and well-nourished.  HENT:  Head: Normocephalic and atraumatic.  Eyes: Conjunctivae are normal.  Neck: Normal range of motion. Neck supple.  Cardiovascular: Normal rate.   Pulmonary/Chest: Effort normal.  Musculoskeletal:  Left lower extremity with continued erythema and edema. Edema decreased slightly (about 1 cm with measurements). Erythema now from below his knee to his foot (this is an improvement). Peeling skin on lower leg. Decreased ROM of left foot due to edema.   Neurological: He is alert and oriented to person, place, and time.  Skin: Skin is warm and dry.  Psychiatric: He has a normal mood and affect. His behavior is normal. Judgment and thought content normal.  Vitals reviewed.  BP 144/62 mmHg  Pulse 80  Temp(Src) 98 F (36.7 C) (Oral)  Resp 18  Wt 219 lb 9.6 oz (99.61 kg) Wt Readings from Last 3 Encounters:  09/09/14 219 lb 9.6 oz (99.61 kg)  09/02/14 219 lb (99.338 kg)  08/26/14 220 lb (99.791 kg)      Assessment & Plan:  Discussed with Dr. Everlene Farrier who also examined patient 1. Cellulitis of left lower leg - Very slowly  improving - continue doxy until follow up with new PCP next week (patient's daughter has pictures that show progress) - elevate as much as possible - can moisturize skin - RTC if increased swelling, or if pain, redness, fever develop  Clarene Reamer, FNP-BC  [facility], [place] [facility]  09/09/2014 10:38 PM

## 2014-09-28 ENCOUNTER — Encounter (HOSPITAL_COMMUNITY): Payer: Self-pay | Admitting: Emergency Medicine

## 2014-09-28 ENCOUNTER — Emergency Department (HOSPITAL_COMMUNITY)
Admission: EM | Admit: 2014-09-28 | Discharge: 2014-09-28 | Disposition: A | Discharge status: Home / Self Care | Payer: Medicare Other | Attending: Emergency Medicine | Admitting: Emergency Medicine

## 2014-09-28 DIAGNOSIS — N189 Chronic kidney disease, unspecified: Secondary | ICD-10-CM | POA: Diagnosis not present

## 2014-09-28 DIAGNOSIS — Z87891 Personal history of nicotine dependence: Secondary | ICD-10-CM | POA: Diagnosis not present

## 2014-09-28 DIAGNOSIS — I2510 Atherosclerotic heart disease of native coronary artery without angina pectoris: Secondary | ICD-10-CM | POA: Insufficient documentation

## 2014-09-28 DIAGNOSIS — Z79899 Other long term (current) drug therapy: Secondary | ICD-10-CM | POA: Insufficient documentation

## 2014-09-28 DIAGNOSIS — Z9889 Other specified postprocedural states: Secondary | ICD-10-CM | POA: Insufficient documentation

## 2014-09-28 DIAGNOSIS — A047 Enterocolitis due to Clostridium difficile: Secondary | ICD-10-CM | POA: Insufficient documentation

## 2014-09-28 DIAGNOSIS — A0472 Enterocolitis due to Clostridium difficile, not specified as recurrent: Secondary | ICD-10-CM

## 2014-09-28 DIAGNOSIS — Z7982 Long term (current) use of aspirin: Secondary | ICD-10-CM | POA: Insufficient documentation

## 2014-09-28 DIAGNOSIS — Z8639 Personal history of other endocrine, nutritional and metabolic disease: Secondary | ICD-10-CM | POA: Insufficient documentation

## 2014-09-28 DIAGNOSIS — R197 Diarrhea, unspecified: Secondary | ICD-10-CM | POA: Diagnosis present

## 2014-09-28 LAB — CBC WITH DIFFERENTIAL/PLATELET
Basophils Absolute: 0 10*3/uL (ref 0.0–0.1)
Basophils Relative: 0 % (ref 0–1)
EOS ABS: 0.1 10*3/uL (ref 0.0–0.7)
EOS PCT: 1 % (ref 0–5)
HEMATOCRIT: 43.6 % (ref 39.0–52.0)
Hemoglobin: 14.5 g/dL (ref 13.0–17.0)
LYMPHS ABS: 0.4 10*3/uL — AB (ref 0.7–4.0)
LYMPHS PCT: 5 % — AB (ref 12–46)
MCH: 31.1 pg (ref 26.0–34.0)
MCHC: 33.3 g/dL (ref 30.0–36.0)
MCV: 93.6 fL (ref 78.0–100.0)
MONO ABS: 0.2 10*3/uL (ref 0.1–1.0)
MONOS PCT: 3 % (ref 3–12)
Neutro Abs: 7.8 10*3/uL — ABNORMAL HIGH (ref 1.7–7.7)
Neutrophils Relative %: 91 % — ABNORMAL HIGH (ref 43–77)
PLATELETS: 243 10*3/uL (ref 150–400)
RBC: 4.66 MIL/uL (ref 4.22–5.81)
RDW: 12.9 % (ref 11.5–15.5)
WBC: 8.6 10*3/uL (ref 4.0–10.5)

## 2014-09-28 LAB — COMPREHENSIVE METABOLIC PANEL
ALK PHOS: 64 U/L (ref 38–126)
ALT: 26 U/L (ref 17–63)
ANION GAP: 8 (ref 5–15)
AST: 24 U/L (ref 15–41)
Albumin: 4 g/dL (ref 3.5–5.0)
BUN: 29 mg/dL — ABNORMAL HIGH (ref 6–20)
CALCIUM: 10.1 mg/dL (ref 8.9–10.3)
CO2: 26 mmol/L (ref 22–32)
Chloride: 106 mmol/L (ref 101–111)
Creatinine, Ser: 1.27 mg/dL — ABNORMAL HIGH (ref 0.61–1.24)
GFR, EST NON AFRICAN AMERICAN: 53 mL/min — AB (ref >60)
GLUCOSE: 137 mg/dL — AB (ref 65–99)
POTASSIUM: 4.2 mmol/L (ref 3.5–5.1)
SODIUM: 140 mmol/L (ref 135–145)
Total Bilirubin: 1 mg/dL (ref 0.3–1.2)
Total Protein: 8.1 g/dL (ref 6.5–8.1)

## 2014-09-28 LAB — I-STAT CG4 LACTIC ACID, ED: Lactic Acid, Venous: 1.99 mmol/L (ref 0.5–2.0)

## 2014-09-28 MED ORDER — SODIUM CHLORIDE 0.9 % IV BOLUS (SEPSIS)
500.0000 mL | Freq: Once | INTRAVENOUS | Status: AC
  Administered 2014-09-28: 500 mL via INTRAVENOUS

## 2014-09-28 MED ORDER — SODIUM CHLORIDE 0.9 % IV SOLN
INTRAVENOUS | Status: DC
  Administered 2014-09-28: 13:00:00 via INTRAVENOUS

## 2014-09-28 MED ORDER — METRONIDAZOLE 500 MG PO TABS: 500.0000 mg | ORAL_TABLET | Freq: Two times a day (BID) | ORAL | Status: DC

## 2014-09-28 NOTE — ED Notes (Signed)
Pt c/o diarrhea, cellulitis to left foot and lower legs bilaterally. Pt states that he believes he had bad reaction to levaquin. Pt states he was prescribed levaquin due to cellulitis, for which he was recently hospitalized, pt states overall cellulitis is improving.

## 2014-09-28 NOTE — ED Notes (Signed)
Sodium chloride 0.9% at 150mL/hr will begin p 500 bolus

## 2014-09-28 NOTE — Discharge Instructions (Signed)
Clostridium Difficile FAQs What is Clostridium difficile infection?  Clostridium difficile [pronounced Klo-STRID-ee-um dif-uh-SEEL], also known as "C. diff" [See-dif], is a germ that can cause diarrhea. Most cases of C. diff infection occur in patients taking antibiotics. The most common symptoms of a C. diff infection include:  Watery diarrhea  Fever  Loss of appetite  Nausea  Belly pain and tenderness Who is most likely to get C. diff infection? The elderly and people with certain medical problems have the greatest chance of getting C. diff. C. diff spores can live outside the human body for a very long time and may be found on things in the environment such as bed linens, bed rails, bathroom fixtures, and medical equipment. C. diff infection can spread from person-to-person on contaminated equipment and on the hands of doctors, nurses, other healthcare providers and visitors. Can C. diff infection be treated? Yes, there are antibiotics that can be used to treat C. diff. In some severe cases, a person might have to have surgery to remove the infected part of the intestines. This surgery is needed in only 1 or 2 out of every 100 persons with C. diff. What are some of the things that hospitals are doing to prevent C. diff infections? To prevent C. diff infections, doctors, nurses, and other healthcare providers:  Clean their hands with soap and water or an alcohol-based hand rub before and after caring for every patient. This can prevent C. diff and other germs from being passed from one patient to another on their hands.  Carefully clean hospital rooms and medical equipment that have been used for patients with C. diff.  Use Contact Precautions to prevent C. diff from spreading to other patients. Contact Precautions mean:  Whenever possible, patients with C. diff will have a single room or share a room only with someone else who also has C. diff.  Healthcare providers will put on gloves  and wear a gown over their clothing while taking care of patients with C. diff.  Visitors may also be asked to wear a gown and gloves.  When leaving the room, hospital providers and visitors remove their gown and gloves and clean their hands.  Patients on Contact Precautions are asked to stay in their hospital rooms as much as possible. They should not go to common areas, such as the gift shop or cafeteria. They can go to other areas of the hospital for treatments and tests.  Only give patients antibiotics when it is necessary. What can I do to help prevent C. diff infections?  Make sure that all doctors, nurses, and other healthcare providers clean their hands with soap and water or an alcohol-based hand rub before and after caring for you.  If you do not see your providers clean their hands, please ask them to do so.  Only take antibiotics as prescribed by your doctor.  Be sure to clean your own hands often, especially after using the bathroom and before eating. Can my friends and family get C. diff when they visit me? C. diff infection usually does not occur in persons who are not taking antibiotics. Visitors are not likely to get C. diff. Still, to make it safer for visitors, they should:  Clean their hands before they enter your room and as they leave your room  Ask the nurse if they need to wear protective gowns and gloves when they visit you. What do I need to do when I go home from the hospital? Once you  are back at home, you can return to your normal routine. Often, the diarrhea will be better or completely gone before you go home. This makes giving C. diff to other people much less likely. There are a few things you should do, however, to lower the chances of developing C. diff infection again or of spreading it to others.  If you are given a prescription to treat C. diff, take the medicine exactly as prescribed by your doctor and pharmacist. Do not take half-doses or stop before  you run out.  Wash your hands often, especially after going to the bathroom and before preparing food.  People who live with you should wash their hands often as well.  If you develop more diarrhea after you get home, tell your doctor immediately.  Your doctor may give you additional instructions. If you have questions, please ask your doctor or nurse. Developed and co-sponsored by [employer] for [place] [phone]); Infectious Diseases Society of [place] (IDSA); [place]; Association for Professionals in Infection Control and Epidemiology (APIC); Centers for Disease Control and Prevention (CDC); and [employer]. Document Released: 02/19/2013 Document Reviewed: 02/19/2013 [facility] Patient Information 2015 [place], [place]. This information is not intended to replace advice given to you by your health care provider. Make sure you discuss any questions you have with your health care provider.

## 2014-09-28 NOTE — ED Provider Notes (Signed)
CSN: [id number]     Arrival date & time 09/28/14  9470 History   First MD Initiated Contact with Patient 09/28/14 [phone]     Chief Complaint  Patient presents with  . Diarrhea      HPI Pt c/o diarrhea, cellulitis to left foot and lower legs bilaterally. Pt states that he believes he had bad reaction to levaquin. Pt states he was prescribed levaquin due to cellulitis, for which he was recently hospitalized, pt states overall cellulitis is improving.  Past Medical History  Diagnosis Date  . Coronary artery disease     LHC 4/10: Mid LAD 40-50%, then 70%, OM1 20-30%, EF 65%. Mid LAD FFR 0.89 (not hemodynamically significant). Medical therapy was continued.  . Obstructive sleep apnea   . Hyperlipidemia   . Chronic kidney disease     mild insuffiency   Past Surgical History  Procedure Laterality Date  . Cardiac catheterization  05/02/2003    single vessel,moderate stenosis mid LAD  . Cardiac catheterization  06/12/2008    continue med. therapy  . Right shoulder surgery     Family History  Problem Relation Age of Onset  . Cancer Mother   . Hypertension Mother   . Other Father     old age  . Hypertension Father    History  Substance Use Topics  . Smoking status: Former Smoker -- 40 years    Types: Pipe    Quit date: 02/28/1978  . Smokeless tobacco: Never Used     Comment: smoked pipe only   . Alcohol Use: No     Comment: rarely wine    Review of Systems  All other systems reviewed and are negative  Allergies  Enablex; Other; Oxybutynin; and Sulfonamide derivatives  Home Medications   Prior to Admission medications   Medication Sig Start Date End Date Taking? Authorizing Provider  Ascorbic Acid (VITAMIN C) 1000 MG tablet Take 1,000 mg by mouth daily.     Yes Historical Provider, MD  aspirin EC 81 MG tablet Take 81 mg by mouth daily.   Yes Historical Provider, MD  calcium-vitamin D (OSCAL WITH D) 500-200 MG-UNIT per tablet Take 1 tablet by mouth 2 (two) times daily.    Yes Historical Provider, MD  CRESTOR 10 MG tablet TAKE 1/2 TABLET BY MOUTH EVERY OTHER DAY 03/07/14  Yes Peter M Martinique, MD  furosemide (LASIX) 20 MG tablet Take 20 mg by mouth daily. Take 20 mg daily if needed for swelling 04/29/14  Yes Peter M Martinique, MD  Glucosamine HCl-Glucosamin SO4 (GLUCOSAMINE COMPLEX PO) Take 1 tablet by mouth daily.    Yes Historical Provider, MD  ibuprofen (ADVIL,MOTRIN) 200 MG tablet Take 200 mg by mouth every 6 (six) hours as needed for moderate pain.   Yes Historical Provider, MD  Multiple Vitamin (MULTIVITAMIN) tablet Take 1 tablet by mouth daily.     Yes Historical Provider, MD  Omega-3 Fatty Acids (FISH OIL) 600 MG CAPS Take 1 capsule by mouth 2 (two) times daily.    Yes Historical Provider, MD  OVER THE COUNTER MEDICATION Take 1 tablet by mouth 2 (two) times daily. G A [facility] A (gets at [facility]).   Yes Historical Provider, MD  Psyllium (METAMUCIL PO) Take 5 mLs by mouth 2 (two) times daily.    Yes Historical Provider, MD  Saw Palmetto 450 MG CAPS Take 1 capsule by mouth 2 (two) times daily.    Yes Historical Provider, MD  Theanine 100 MG CAPS Take 100 mg by  mouth daily.    Yes Historical Provider, MD  VALERIAN ROOT PO Take 1 tablet by mouth at bedtime as needed (muscle spasms).    Yes Historical Provider, MD  metroNIDAZOLE (FLAGYL) 500 MG tablet Take 1 tablet (500 mg total) by mouth 2 (two) times daily. 09/28/14   Leonard Schwartz, MD   BP 144/74 mmHg  Pulse 86  Temp(Src) 97.5 F (36.4 C) (Oral)  Resp 20  SpO2 97% Physical Exam Physical Exam  Nursing note and vitals reviewed. Constitutional: He is oriented to person, place, and time. He appears well-developed and well-nourished. No distress.  HENT:  Head: Normocephalic and atraumatic.  Eyes: Pupils are equal, round, and reactive to light.  Neck: Normal range of motion.  Cardiovascular: Normal rate and intact distal pulses.   Pulmonary/Chest: No respiratory distress.  Abdominal: Normal appearance. He exhibits no  distension.  Musculoskeletal: Normal range of motion.  Redness noted to the pretibial area of the left lower leg.   Neurological: He is alert and oriented to person, place, and time. No cranial nerve deficit.  Skin: Skin is warm and dry. No rash noted.  Psychiatric: He has a normal mood and affect. His behavior is normal.   ED Course  Procedures (including critical care time) Labs Review Labs Reviewed  CBC WITH DIFFERENTIAL/PLATELET - Abnormal; Notable for the following:    Neutrophils Relative % 91 (*)    Neutro Abs 7.8 (*)    Lymphocytes Relative 5 (*)    Lymphs Abs 0.4 (*)    All other components within normal limits  COMPREHENSIVE METABOLIC PANEL - Abnormal; Notable for the following:    Glucose, Bld 137 (*)    BUN 29 (*)    Creatinine, Ser 1.27 (*)    GFR calc non Af Amer 53 (*)    All other components within normal limits  GI PATHOGEN PANEL BY PCR, STOOL  I-STAT CG4 LACTIC ACID, ED  I-STAT CG4 LACTIC ACID, ED    Imaging Review No results found.  I discussed the case with infectious disease, Dr. Bradd Burner who recommended we stop the Levaquin and empirically start patient on Flagyl.  MDM   Final diagnoses:  C. difficile diarrhea        Leonard Schwartz, MD 09/28/14 1309

## 2014-09-28 NOTE — ED Notes (Signed)
MD stated to me that he was going to cancel the I stat CG4 at 13:03

## 2014-09-30 LAB — GI PATHOGEN PANEL BY PCR, STOOL
C difficile toxin A/B: NOT DETECTED
Campylobacter by PCR: NOT DETECTED
Cryptosporidium by PCR: NOT DETECTED
E coli (ETEC) LT/ST: NOT DETECTED
E coli (STEC): NOT DETECTED
E coli 0157 by PCR: NOT DETECTED
G lamblia by PCR: NOT DETECTED
ROTAVIRUS A BY PCR: NOT DETECTED
Salmonella by PCR: NOT DETECTED
Shigella by PCR: NOT DETECTED

## 2014-10-13 ENCOUNTER — Telehealth: Payer: Self-pay | Admitting: Internal Medicine

## 2014-10-13 NOTE — Telephone Encounter (Signed)
I think he will need to discuss his CPAP machine concerns with his DME company, because they will have to let him know what his options are under his insurance. I don't mind ordering a new machine, but he might have to pay for it.

## 2014-10-13 NOTE — Telephone Encounter (Signed)
Spoke with pt, states he switched from respironics to resmed about 8 mos ago and is struggling with wearing his new machine.  Pt dislikes that there are no lights on it which makes it difficult to see and manage at night, and has concerns with the privacy settings on it.  Pt is requesting that an order is placed for a new respironics machine.  [facility] uses [facility].   CY please advise.  Thanks!

## 2014-10-13 NOTE — Telephone Encounter (Signed)
ATC PT and was hung up on. WCB

## 2014-10-14 NOTE — Telephone Encounter (Signed)
I called made pt aware of below. He will check with his DME and call us back. Will sign off for now

## 2015-01-08 ENCOUNTER — Ambulatory Visit (INDEPENDENT_AMBULATORY_CARE_PROVIDER_SITE_OTHER): Payer: Medicare Other | Admitting: Internal Medicine

## 2015-01-08 ENCOUNTER — Encounter: Payer: Self-pay | Admitting: Internal Medicine

## 2015-01-08 VITALS — BP 146/94 | HR 81 | Ht 69.0 in | Wt 220.0 lb

## 2015-01-08 DIAGNOSIS — G4733 Obstructive sleep apnea (adult) (pediatric): Secondary | ICD-10-CM

## 2015-01-08 NOTE — Patient Instructions (Signed)
You are doing great with CPAP 10/ [facility]  Suggest you talk with the DME company about ways to refurbish your old machine or maybe work better with your new machine.
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
## 2015-01-10 NOTE — Assessment & Plan Note (Signed)
Good compliance and control with satisfactory download. He is going to talk with his DME company about his issues with current machine. Unfortunately it will get replaced for 4 more years. He will see if he can get his old machine refurbished

## 2015-03-25 ENCOUNTER — Encounter: Payer: Self-pay | Admitting: Cardiology

## 2015-03-25 ENCOUNTER — Ambulatory Visit (INDEPENDENT_AMBULATORY_CARE_PROVIDER_SITE_OTHER): Payer: Medicare Other | Admitting: Cardiology

## 2015-03-25 VITALS — BP 152/86 | HR 81 | Ht 70.0 in | Wt 220.7 lb

## 2015-03-25 DIAGNOSIS — I10 Essential (primary) hypertension: Secondary | ICD-10-CM

## 2015-03-25 DIAGNOSIS — E785 Hyperlipidemia, unspecified: Secondary | ICD-10-CM

## 2015-03-25 DIAGNOSIS — I2510 Atherosclerotic heart disease of native coronary artery without angina pectoris: Secondary | ICD-10-CM

## 2015-03-25 NOTE — Progress Notes (Signed)
[name] [name] Date of Birth: 05/07/1940   History of Present Illness: [name] [name] is seen today for followup of his coronary disease. He has a history of an intermediate stenosis in the mid LAD had a 70% by angiography. Previous nuclear stress test in 2008 was normal. He underwent cardiac catheterization in 2010 with flow wire evaluation of the LAD which was normal. He has been managed medically.  On follow up today he is doing well from a cardiac standpoint. No chest pain. Some dyspnea on exertion that is chronic. Activity has been limited. [place] in June with injury to his left leg. Later developed cellulitis that was resistant and required several rounds of antibiotics to clear. Not able to walk much due to this.   Current Outpatient Prescriptions on File Prior to Visit  Medication Sig Dispense Refill  . Ascorbic Acid (VITAMIN C) 1000 MG tablet Take 1,000 mg by mouth daily.      Marland Kitchen aspirin EC 81 MG tablet Take 81 mg by mouth daily.    . calcium-vitamin D (OSCAL WITH D) 500-200 MG-UNIT per tablet Take 1 tablet by mouth 2 (two) times daily.    . CRESTOR 10 MG tablet TAKE 1/2 TABLET BY MOUTH EVERY OTHER DAY 30 tablet 0  . furosemide (LASIX) 20 MG tablet Take 20 mg by mouth daily. Take 20 mg daily if needed for swelling 30 tablet 6  . ibuprofen (ADVIL,MOTRIN) 200 MG tablet Take 200 mg by mouth every 6 (six) hours as needed for moderate pain.    . Multiple Vitamin (MULTIVITAMIN) tablet Take 1 tablet by mouth daily.      . niacin (NIASPAN) 500 MG CR tablet Take 500 mg by mouth at bedtime.    . Omega-3 Fatty Acids (FISH OIL) 600 MG CAPS Take 1 capsule by mouth 2 (two) times daily.     Marland Kitchen OVER THE COUNTER MEDICATION Take 1 tablet by mouth 2 (two) times daily. [facility] (gets at [facility]).    . Psyllium (METAMUCIL PO) Take 5 mLs by mouth 2 (two) times daily.     . Saw Palmetto 450 MG CAPS Take 1 capsule by mouth 2 (two) times daily.     . Theanine 100 MG CAPS Take 100 mg by mouth daily.     Marland Kitchen VALERIAN ROOT  PO Take 1 tablet by mouth at bedtime as needed (muscle spasms).      No current facility-administered medications on file prior to visit.    Allergies  Allergen Reactions  . Enablex [Darifenacin Hydrobromide Er]     PT STATES IT MAKES HIM "BONKERS" AND VERY TIRED  . Other     Intolerance to zetia and lipitor  . Oxybutynin Other (See Comments)    Memory impairment, temperament changes  . Sulfonamide Derivatives     Past Medical History  Diagnosis Date  . Coronary artery disease     LHC 4/10: Mid LAD 40-50%, then 70%, OM1 20-30%, EF 65%. Mid LAD FFR 0.89 (not hemodynamically significant). Medical therapy was continued.  . Obstructive sleep apnea   . Hyperlipidemia   . Chronic kidney disease     mild insuffiency    Past Surgical History  Procedure Laterality Date  . Cardiac catheterization  05/02/2003    single vessel,moderate stenosis mid LAD  . Cardiac catheterization  06/12/2008    continue med. therapy  . Right shoulder surgery      History  Smoking status  . Former Smoker -- 24 years  .  Types: Pipe  . Quit date: 02/28/1978  Smokeless tobacco  . Never Used    Comment: smoked pipe only     History  Alcohol Use No    Comment: rarely wine    Family History  Problem Relation Age of Onset  . Cancer Mother   . Hypertension Mother   . Other Father     old age  . Hypertension Father     Review of Systems: As noted in HPI. All other systems were reviewed and are negative.  Physical Exam: BP 152/86 mmHg  Pulse 81  Ht 5\' 10"  (1.778 m)  Wt 100.109 kg (220 lb 11.2 oz)  BMI 31.67 kg/m2 He is an overweight WM in NAD.  The HEENT exam  is normal.  The carotids are 2+ without bruits.  There is no thyromegaly.  There is no JVD.  The lungs are clear.   The heart exam reveals a regular rate with a normal S1 and S2.  There are no murmurs, gallops, or rubs.  The PMI is not displaced.   Abdominal exam reveals good bowel sounds.  Obese.  There is no hepatosplenomegaly or  tenderness.  There are no masses.  Left knee is in immobilizer. Leg is extensively bruised below the knee with diffuse swelling.  The distal pulses are intact.  Cranial nerves II - XII are intact.   LABORATORY DATA: Ecg today shows NSR with rate 81. First degree AVB. nonspecific TWA.   Assessment / Plan:  1. Coronary disease with borderline LAD stenosis. Negative ischemic evaluation the past. We will continue medical management. I'll followup in 6 months.  2. Hyperlipidemia. On statin.  3. Chronic fatigue/ depression.   4. cellullitis left leg. Now resolved. Recommend continued use of support hose.

## 2015-03-25 NOTE — Patient Instructions (Signed)
Continue your current therapy  I will se you in 6 months.

## 2015-04-09 ENCOUNTER — Other Ambulatory Visit: Payer: Self-pay | Admitting: Cardiology

## 2015-10-01 NOTE — Progress Notes (Signed)
[name] [name] Date of Birth: 08-Oct-1940   History of Present Illness: [name] [name] is seen today for followup of his coronary disease. He has a history of an intermediate stenosis in the mid LAD had a 70% by angiography. Previous nuclear stress test in 2008 was normal. He underwent cardiac catheterization in 2010 with flow wire evaluation of the LAD which was normal. He has been managed medically. He also has a history of OSA followed by Dr. Annamaria Boots.  On follow up today he is doing well from a cardiac standpoint. No chest pain. Some dyspnea on exertion that is chronic. Activity has been limited. Notes he is more forgetful. Just gets tired easily. Knows he needs more exercise but can't seem to find the time.  Current Outpatient Prescriptions on File Prior to Visit  Medication Sig Dispense Refill  . Apoaequorin (PREVAGEN PO) Take 1 capsule by mouth daily.    . Ascorbic Acid (VITAMIN C) 1000 MG tablet Take 1,000 mg by mouth daily.      Marland Kitchen aspirin EC 81 MG tablet Take 81 mg by mouth daily.    . calcium-vitamin D (OSCAL WITH D) 500-200 MG-UNIT per tablet Take 1 tablet by mouth 2 (two) times daily.    . CRESTOR 10 MG tablet TAKE 1/2 TABLET BY MOUTH EVERY OTHER DAY 30 tablet 0  . furosemide (LASIX) 20 MG tablet Take 20 mg by mouth daily. Take 20 mg daily if needed for swelling 30 tablet 6  . Glucosamine-Chondroitin (OSTEO BI-FLEX REGULAR STRENGTH PO) Take 1 tablet by mouth 2 (two) times daily.    Marland Kitchen ibuprofen (ADVIL,MOTRIN) 200 MG tablet Take 200 mg by mouth every 6 (six) hours as needed for moderate pain.    . Multiple Vitamin (MULTIVITAMIN) tablet Take 1 tablet by mouth daily.      . Omega-3 Fatty Acids (FISH OIL) 600 MG CAPS Take 1 capsule by mouth 2 (two) times daily.     Marland Kitchen OVER THE COUNTER MEDICATION Take 1 tablet by mouth 2 (two) times daily. [facility] (gets at [facility]).    . Psyllium (METAMUCIL PO) Take 5 mLs by mouth 2 (two) times daily.     . Saw Palmetto 450 MG CAPS Take 1 capsule by mouth 2 (two)  times daily.     . Theanine 100 MG CAPS Take 100 mg by mouth daily.     Marland Kitchen VALERIAN ROOT PO Take 1 tablet by mouth at bedtime as needed (muscle spasms).      No current facility-administered medications on file prior to visit.     Allergies  Allergen Reactions  . Enablex [Darifenacin Hydrobromide Er]     PT STATES IT MAKES HIM "BONKERS" AND VERY TIRED  . Other     Intolerance to zetia and lipitor  . Oxybutynin Other (See Comments)    Memory impairment, temperament changes  . Sulfonamide Derivatives     Past Medical History:  Diagnosis Date  . Chronic kidney disease    mild insuffiency  . Coronary artery disease    LHC 4/10: Mid LAD 40-50%, then 70%, OM1 20-30%, EF 65%. Mid LAD FFR 0.89 (not hemodynamically significant). Medical therapy was continued.  . Hyperlipidemia   . Obstructive sleep apnea     Past Surgical History:  Procedure Laterality Date  . CARDIAC CATHETERIZATION  05/02/2003   single vessel,moderate stenosis mid LAD  . CARDIAC CATHETERIZATION  06/12/2008   continue med. therapy  . right shoulder surgery  History  Smoking Status  . Former Smoker  . Years: 40.00  . Types: Pipe  . Quit date: 02/28/1978  Smokeless Tobacco  . Never Used    Comment: smoked pipe only     History  Alcohol Use No    Comment: rarely wine    Family History  Problem Relation Age of Onset  . Cancer Mother   . Hypertension Mother   . Other Father     old age  . Hypertension Father     Review of Systems: As noted in HPI. All other systems were reviewed and are negative.  Physical Exam: BP 126/78   Pulse 78   Ht 5\' 10"  (1.778 m)   Wt 219 lb 6.4 oz (99.5 kg)   BMI 31.48 kg/m  He is an overweight WM in NAD.  The HEENT exam  is normal.  The carotids are 2+ without bruits.  There is no thyromegaly.  There is no JVD.  The lungs are clear.   The heart exam reveals a regular rate with a normal S1 and S2.  There are no murmurs, gallops, or rubs.  The PMI is not displaced.    Abdominal exam reveals good bowel sounds.  Obese.  There is no hepatosplenomegaly or tenderness.  There are no masses.  Chronic hyperpigmentation in LE.  The distal pulses are intact.  Cranial nerves II - XII are intact.   LABORATORY DATA: Ecg today shows NSR with rate 81. First degree AVB. nonspecific TWA.   Labs reviewed from primary care dated 05/18/15: normal chemistry panel, TSH, CBC. Cholesterol 166, trig- 109, HDL 44, LDL 100  Assessment / Plan:  1. Coronary disease with borderline LAD stenosis. Negative ischemic evaluation the past. We will continue medical management. I'll followup in 6 months.  2. Hyperlipidemia. On statin. Unable to tolerate higher doses.  3. Chronic fatigue/ depression.

## 2015-10-02 ENCOUNTER — Ambulatory Visit (INDEPENDENT_AMBULATORY_CARE_PROVIDER_SITE_OTHER): Payer: Medicare Other | Admitting: Cardiology

## 2015-10-02 ENCOUNTER — Encounter: Payer: Self-pay | Admitting: Cardiology

## 2015-10-02 VITALS — BP 126/78 | HR 78 | Ht 70.0 in | Wt 219.4 lb

## 2015-10-02 DIAGNOSIS — I2510 Atherosclerotic heart disease of native coronary artery without angina pectoris: Secondary | ICD-10-CM | POA: Diagnosis not present

## 2015-10-02 DIAGNOSIS — N182 Chronic kidney disease, stage 2 (mild): Secondary | ICD-10-CM

## 2015-10-02 DIAGNOSIS — I10 Essential (primary) hypertension: Secondary | ICD-10-CM | POA: Diagnosis not present

## 2015-10-02 DIAGNOSIS — E785 Hyperlipidemia, unspecified: Secondary | ICD-10-CM | POA: Diagnosis not present

## 2015-10-02 NOTE — Patient Instructions (Signed)
Continue your current therapy  I will see you in 6 months.   

## 2015-10-22 DIAGNOSIS — I872 Venous insufficiency (chronic) (peripheral): Secondary | ICD-10-CM | POA: Insufficient documentation

## 2015-11-08 ENCOUNTER — Emergency Department (HOSPITAL_COMMUNITY): Payer: Medicare Other

## 2015-11-08 ENCOUNTER — Encounter (HOSPITAL_COMMUNITY): Payer: Self-pay | Admitting: Emergency Medicine

## 2015-11-08 ENCOUNTER — Emergency Department (HOSPITAL_COMMUNITY)
Admission: EM | Admit: 2015-11-08 | Discharge: 2015-11-08 | Disposition: A | Discharge status: Home / Self Care | Payer: Medicare Other | Attending: Emergency Medicine | Admitting: Emergency Medicine

## 2015-11-08 DIAGNOSIS — N390 Urinary tract infection, site not specified: Secondary | ICD-10-CM | POA: Diagnosis not present

## 2015-11-08 DIAGNOSIS — R251 Tremor, unspecified: Secondary | ICD-10-CM

## 2015-11-08 DIAGNOSIS — R4182 Altered mental status, unspecified: Secondary | ICD-10-CM | POA: Diagnosis present

## 2015-11-08 DIAGNOSIS — I129 Hypertensive chronic kidney disease with stage 1 through stage 4 chronic kidney disease, or unspecified chronic kidney disease: Secondary | ICD-10-CM | POA: Insufficient documentation

## 2015-11-08 DIAGNOSIS — I2510 Atherosclerotic heart disease of native coronary artery without angina pectoris: Secondary | ICD-10-CM | POA: Diagnosis not present

## 2015-11-08 DIAGNOSIS — Z7982 Long term (current) use of aspirin: Secondary | ICD-10-CM | POA: Insufficient documentation

## 2015-11-08 DIAGNOSIS — N182 Chronic kidney disease, stage 2 (mild): Secondary | ICD-10-CM | POA: Diagnosis not present

## 2015-11-08 DIAGNOSIS — Z87891 Personal history of nicotine dependence: Secondary | ICD-10-CM | POA: Diagnosis not present

## 2015-11-08 DIAGNOSIS — Z79899 Other long term (current) drug therapy: Secondary | ICD-10-CM | POA: Insufficient documentation

## 2015-11-08 LAB — URINE MICROSCOPIC-ADD ON

## 2015-11-08 LAB — URINALYSIS, ROUTINE W REFLEX MICROSCOPIC
Bilirubin Urine: NEGATIVE
Glucose, UA: NEGATIVE mg/dL
Hgb urine dipstick: NEGATIVE
Ketones, ur: NEGATIVE mg/dL
NITRITE: NEGATIVE
PH: 7.5 (ref 5.0–8.0)
Protein, ur: NEGATIVE mg/dL
SPECIFIC GRAVITY, URINE: 1.017 (ref 1.005–1.030)

## 2015-11-08 LAB — CBC
HEMATOCRIT: 41.6 % (ref 39.0–52.0)
Hemoglobin: 13.5 g/dL (ref 13.0–17.0)
MCH: 31 pg (ref 26.0–34.0)
MCHC: 32.5 g/dL (ref 30.0–36.0)
MCV: 95.4 fL (ref 78.0–100.0)
Platelets: 199 10*3/uL (ref 150–400)
RBC: 4.36 MIL/uL (ref 4.22–5.81)
RDW: 12.7 % (ref 11.5–15.5)
WBC: 10.1 10*3/uL (ref 4.0–10.5)

## 2015-11-08 LAB — COMPREHENSIVE METABOLIC PANEL
ALT: 41 U/L (ref 17–63)
AST: 38 U/L (ref 15–41)
Albumin: 3.8 g/dL (ref 3.5–5.0)
Alkaline Phosphatase: 60 U/L (ref 38–126)
Anion gap: 7 (ref 5–15)
BUN: 14 mg/dL (ref 6–20)
CHLORIDE: 103 mmol/L (ref 101–111)
CO2: 27 mmol/L (ref 22–32)
Calcium: 9.9 mg/dL (ref 8.9–10.3)
Creatinine, Ser: 1.27 mg/dL — ABNORMAL HIGH (ref 0.61–1.24)
GFR calc Af Amer: >60 mL/min (ref >60)
GFR calc non Af Amer: 53 mL/min — ABNORMAL LOW (ref >60)
Glucose, Bld: 123 mg/dL — ABNORMAL HIGH (ref 65–99)
POTASSIUM: 4.2 mmol/L (ref 3.5–5.1)
SODIUM: 137 mmol/L (ref 135–145)
Total Bilirubin: 0.9 mg/dL (ref 0.3–1.2)
Total Protein: 7.1 g/dL (ref 6.5–8.1)

## 2015-11-08 LAB — CBG MONITORING, ED: Glucose-Capillary: 116 mg/dL — ABNORMAL HIGH (ref 65–99)

## 2015-11-08 MED ORDER — ACETAMINOPHEN 325 MG PO TABS
650.0000 mg | ORAL_TABLET | Freq: Once | ORAL | Status: AC
  Administered 2015-11-08: 650 mg via ORAL
  Filled 2015-11-08: qty 2

## 2015-11-08 MED ORDER — CEPHALEXIN 500 MG PO CAPS: 500.0000 mg | ORAL_CAPSULE | Freq: Two times a day (BID) | ORAL | 0 refills | Status: DC

## 2015-11-08 NOTE — Discharge Instructions (Signed)
Take antibiotics as prescribed. Please follow up closely with her primary care doctor this week for recheck. Return without fail for worsening symptoms, including fever, confusion, vomiting, severe abdominal or back pain, difficulty breathing, or any other symptoms concerning to you.

## 2015-11-08 NOTE — ED Provider Notes (Signed)
[facility] DEPT Provider Note   CSN: [id number] Arrival date & time: 11/08/15  1828     History   Chief Complaint Chief Complaint  Patient presents with  . Altered Mental Status    HPI [name] [name] is a 75 y.o. male.  HPI 75 year old male with history of chronic kidney disease, coronary artery disease and hyperlipidemia who presents with generalized weakness. History is primarily provided by patient's daughter who states that over the course of the past several months he has had cognitive decline and getting workup for dementia through [facility]. Reports that he has been weak today, with daily activities. States that today took a walk earlier today and he subsequently took a 3 hour nap. Upon waking up he had tremoring of his hands bilaterally that was uncontrolled. He did not have any loss of consciousness with this. Denies any confusion, fevers, chills, nausea or vomiting, abdominal pain, diarrhea, chest pain, difficulty breathing, cough, congestion, sore throat, runny nose. He has noted recently that he has had dysuria with urinary frequency. Past Medical History:  Diagnosis Date  . Chronic kidney disease    mild insuffiency  . Coronary artery disease    LHC 4/10: Mid LAD 40-50%, then 70%, OM1 20-30%, EF 65%. Mid LAD FFR 0.89 (not hemodynamically significant). Medical therapy was continued.  . Hyperlipidemia   . Obstructive sleep apnea     Patient Active Problem List   Diagnosis Date Noted  . Cellulitis of left lower leg   . Left leg swelling   . Cellulitis 08/23/2014  . CHF, chronic ([place]) 04/29/2014  . Chronic kidney disease, stage 2, mildly decreased GFR   . Avitaminosis D 05/19/2010  . Depression, major, recurrent, mild ([place]) 05/19/2010  . Vertigo as late effect of stroke 04/29/2010  . Benign essential HTN 12/23/2009  . 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 Hyperlipidemia 01/18/2008  . Coronary atherosclerosis 01/18/2008  . Obstructive sleep apnea  05/28/2007  . 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 Benign prostatic hyperplasia with urinary obstruction 05/11/2005    Past Surgical History:  Procedure Laterality Date  . CARDIAC CATHETERIZATION  05/02/2003   single vessel,moderate stenosis mid LAD  . CARDIAC CATHETERIZATION  06/12/2008   continue med. therapy  . right shoulder surgery         Home Medications    Prior to Admission medications   Medication Sig Start Date End Date Taking? Authorizing Provider  Apoaequorin (PREVAGEN PO) Take 1 capsule by mouth daily.   Yes Historical Provider, MD  Ascorbic Acid (VITAMIN C) 1000 MG tablet Take 1,000 mg by mouth daily.     Yes Historical Provider, MD  aspirin EC 81 MG tablet Take 81 mg by mouth daily.   Yes Historical Provider, MD  calcium-vitamin D (OSCAL WITH D) 500-200 MG-UNIT per tablet Take 1 tablet by mouth 2 (two) times daily.   Yes Historical Provider, MD  CRESTOR 10 MG tablet TAKE 1/2 TABLET BY MOUTH EVERY OTHER DAY 03/07/14  Yes Peter M Martinique, MD  furosemide (LASIX) 20 MG tablet Take 20 mg by mouth daily. Take 20 mg daily if needed for swelling 04/29/14  Yes Peter M Martinique, MD  Glucosamine-Chondroitin (OSTEO BI-FLEX REGULAR STRENGTH PO) Take 1 tablet by mouth 2 (two) times daily.   Yes Historical Provider, MD  ibuprofen (ADVIL,MOTRIN) 200 MG tablet Take 200 mg by mouth every 6 (six) hours as needed for moderate pain.   Yes Historical Provider, MD  Multiple Vitamin (MULTIVITAMIN) tablet Take 1  tablet by mouth daily.     Yes Historical Provider, MD  Omega-3 Fatty Acids (FISH OIL) 600 MG CAPS Take 1 capsule by mouth 2 (two) times daily.    Yes Historical Provider, MD  OVER THE COUNTER MEDICATION Take 1 tablet by mouth 2 (two) times daily. G [facility] (gets at [facility]).   Yes Historical Provider, MD  Psyllium (METAMUCIL PO) Take 5 mLs by mouth daily.    Yes Historical Provider, MD  Saw Palmetto 450 MG CAPS Take 1 capsule by mouth 2 (two) times daily.    Yes Historical Provider, MD    Theanine 100 MG CAPS Take 100 mg by mouth daily.    Yes Historical Provider, MD  VALERIAN ROOT PO Take 1 tablet by mouth at bedtime as needed (muscle spasms).    Yes Historical Provider, MD  cephALEXin (KEFLEX) 500 MG capsule Take 1 capsule (500 mg total) by mouth 2 (two) times daily. 11/08/15   Forde Dandy, MD    Family History Family History  Problem Relation Age of Onset  . Cancer Mother   . Hypertension Mother   . Other Father     old age  . Hypertension Father     Social History Social History  Substance Use Topics  . Smoking status: Former Smoker    Years: 40.00    Types: Pipe    Quit date: 02/28/1978  . Smokeless tobacco: Never Used     Comment: smoked pipe only   . Alcohol use No     Comment: rarely wine     Allergies   Enablex [darifenacin hydrobromide er]; Levaquin [levofloxacin]; Other; Oxybutynin; and Sulfonamide derivatives   Review of Systems Review of Systems 10/14 systems reviewed and are negative other than those stated in the HPI   Physical Exam Updated Vital Signs BP 154/75   Pulse 84   Temp 99.8 F (37.7 C) (Oral)   Resp 15   SpO2 97%   Physical Exam Physical Exam  Nursing note and vitals reviewed. Constitutional: Well developed, well nourished, non-toxic, and in no acute distress Head: Normocephalic and atraumatic.  Mouth/Throat: Oropharynx is clear and moist.  Neck: Normal range of motion. Neck supple.  Cardiovascular: Normal rate and regular rhythm.   Pulmonary/Chest: Effort normal and breath sounds normal.  Abdominal: Soft. There is no tenderness. There is no rebound and no guarding.  Musculoskeletal: Normal range of motion.  Neurological: Alert, no facial droop, fluent speech, moves all extremities symmetrically, sensation to light touch in tact throughout Skin: Skin is warm and dry.  Psychiatric: Cooperative   ED Treatments / Results  Labs (all labs ordered are listed, but only abnormal results are displayed) Labs Reviewed   COMPREHENSIVE METABOLIC PANEL - Abnormal; Notable for the following:       Result Value   Glucose, Bld 123 (*)    Creatinine, Ser 1.27 (*)    GFR calc non Af Amer 53 (*)    All other components within normal limits  URINALYSIS, ROUTINE W REFLEX MICROSCOPIC (NOT AT [facility]) - Abnormal; Notable for the following:    Leukocytes, UA TRACE (*)    All other components within normal limits  URINE MICROSCOPIC-ADD ON - Abnormal; Notable for the following:    Squamous Epithelial / LPF 0-5 (*)    Bacteria, UA RARE (*)    All other components within normal limits  CBG MONITORING, ED - Abnormal; Notable for the following:    Glucose-Capillary 116 (*)    All  other components within normal limits  URINE CULTURE  CBC    EKG  EKG Interpretation  Date/Time:  Sunday November 08 2015 21:04:56 EDT Ventricular Rate:  84 PR Interval:    QRS Duration: 83 QT Interval:  415 QTC Calculation: 491 R Axis:   27 Text Interpretation:  Age not entered, assumed to be  75 years old for purpose of ECG interpretation Sinus rhythm Borderline T wave abnormalities Borderline prolonged QT interval no acute changes  Confirmed by Damier Disano MD, Frankie Scipio [phone]) on 11/08/2015 9:12:34 PM       Radiology Dg Chest 2 View  Result Date: 11/08/2015 CLINICAL DATA:  Fever and weakness, onset today EXAM: CHEST  2 VIEW COMPARISON:  11/04/2011 FINDINGS: The lungs are clear. The pulmonary vasculature is normal. Heart size is borderline enlarged, unchanged. Hilar and mediastinal contours are unremarkable. There is no pleural effusion. IMPRESSION: No acute cardiopulmonary disease. Electronically Signed   By: Andreas Newport M.D.   On: 11/08/2015 23:28    Procedures Procedures (including critical care time)  Medications Ordered in ED Medications  acetaminophen (TYLENOL) tablet 650 mg (650 mg Oral Given 11/08/15 2259)     Initial Impression / Assessment and Plan / ED Course  I have reviewed the triage vital signs and the nursing  notes.  Pertinent labs & imaging results that were available during my care of the patient were reviewed by me and considered in my medical decision making (see chart for details).  Clinical Course    75 year old male with history of early dementia, CKD, CAD who presents with generalized weakness, urinary complaints and episode of tremoring. In no acute distress. Baseline mental status according to patient and daughter and no AMS history. Did have temperature of 100.38F in ED, but no tachycardia, hypotension, or respiratory symptoms. No major electrolyte or metabolic derangements. No leukocytosis. Having UTI symptoms. UA with some leukocytes and bacteria. Sent for culture and will treat for potential UTI with course of Keflex. CXR w/o infiltrate. Abdomen benign and no neuro complaints or defcits. Stable for outpatient management at this time. Strict return and follow-up instructions reviewed with patient's daugther. She expressed understanding of all discharge instructions and felt comfortable with the plan of care.   Final Clinical Impressions(s) / ED Diagnoses   Final diagnoses:  Occasional tremors  UTI (lower urinary tract infection)    New Prescriptions New Prescriptions   CEPHALEXIN (KEFLEX) 500 MG CAPSULE    Take 1 capsule (500 mg total) by mouth 2 (two) times daily.     Forde Dandy, MD 11/08/15 [phone]

## 2015-11-08 NOTE — ED Notes (Signed)
Patient Alert and oriented X4. Stable and ambulatory. Patient verbalized understanding of the discharge instructions.  Patient belongings were taken by the patient.  

## 2015-11-08 NOTE — ED Triage Notes (Signed)
Pt sts here because "he doesn't feel right" pt unable to describe further; pt sts he is dehydrated; pt alert and oriented x 3 at present

## 2015-11-08 NOTE — ED Notes (Signed)
Patient taken to X-Ray.

## 2015-11-10 LAB — URINE CULTURE

## 2016-01-11 ENCOUNTER — Ambulatory Visit: Payer: Medicare Other | Admitting: Internal Medicine

## 2016-01-11 ENCOUNTER — Encounter: Payer: Self-pay | Admitting: Internal Medicine

## 2016-01-11 ENCOUNTER — Ambulatory Visit (INDEPENDENT_AMBULATORY_CARE_PROVIDER_SITE_OTHER): Payer: Medicare Other | Admitting: Internal Medicine

## 2016-01-11 DIAGNOSIS — G4733 Obstructive sleep apnea (adult) (pediatric): Secondary | ICD-10-CM

## 2016-01-11 NOTE — Progress Notes (Signed)
01/24/11- 71 yoM former smoker followed for obstructive sleep apnea complicated by CAD, Chronic renal disease. PFT: 12/16/2011-within normal limits. Small airway flows were quite normal but improved with bronchodilator. FEV1 3.03/107%, FEV1/FVC 0.82, DLCO 0.89.  01/07/14- 79 yoM former smoker followed for obstructive sleep apnea complicated by CAD, Chronic renal disease. FOLLOWS FOR: patient wears cpap10/[facility]  6-7 hours nightly and throught out the day during naps. He thinks mask is leaking air. Download confirms excellent compliance and control.   01/08/2015-75 year old male former smoker followed for obstructive sleep apnea complicated by CAD, Chronic renal disease CPAP 10/[facility] FOLLOWS FOR: Currently using CPAP machine every night for at least 6-7 hours. Does not like his current machine.  Reports doing very well with CPAP. Dislikes his current machine which is 26-year-old blood pressure and mask seem appropriate. Download confirms good compliance and control.  01/11/2016-75 year old male former smoker followed for OSA, complicated by CAD, chronic renal disease CPAP 10/[facility] FOLLOWS FOR:DME [facility]. Pt wears CPAP nighlty and at nap times. DL attached. No new supplies needed at this time and pressure works well for patient. Prefers his old CPAP machine over the newer one. Very comfortable with his current pressure. Never sleeps without CPAP and says he feels bad if he doesn't wear it. No new general medical issues reported.   ROS-see HPI Constitutional:   No-   weight loss, night sweats, fevers, chills, fatigue, lassitude. HEENT:   No-  headaches, difficulty swallowing, tooth/dental problems, sore throat,       No-  sneezing, itching, ear ache, nasal congestion, post nasal drip,  CV:  No-   chest pain, orthopnea, PND, swelling in lower extremities, anasarca,                                           dizziness, palpitations Resp: + shortness of breath with exertion or at  rest.              No-   productive cough,  No non-productive cough,  No- coughing up of blood.              No-   change in color of mucus.  No- wheezing.   Skin: No-   rash or lesions. GI:   GU:  MS:  No-   joint pain or swelling.   Neuro-     nothing unusual Psych:  No- change in mood or affect. No depression or anxiety.  No memory loss.  OBJ General- Alert, Oriented, Affect-appropriate, Distress- none acute. +Overweight Skin- rash-none, lesions- none, excoriation- none Lymphadenopathy- none Head- atraumatic            Eyes- Gross vision intact, PERRLA, +Strabismus, conjunctivae clear secretions            Ears- Hearing, canals-normal            Nose- Clear, no-Septal dev, mucus, polyps, erosion, perforation             Throat- Mallampati IV , mucosa clear , drainage- none, tonsils- atrophic Neck- flexible , trachea midline, no stridor , thyroid nl, carotid no bruit Chest - symmetrical excursion , unlabored           Heart/CV- RRR , no murmur , no gallop  , no rub, nl s1 s2                           -  JVD- none , edema- none, stasis changes- none, varices- none           Lung- clear to P&A, wheeze- none, cough- none , dullness-none, rub- none           Chest wall-  Abd-  Br/ Gen/ Rectal- Not done, not indicated Extrem- cyanosis- none, clubbing, none, atrophy- none, strength- nl Neuro- grossly intact to observation

## 2016-01-11 NOTE — Patient Instructions (Signed)
We can continue CPAP 10, [facility], 10 cwp, mask of choice, humidifier, supplies, AirView    Dx OSA  Please call if we can help  We will log your Prevnar 13 pneumonia vaccine from October, 2017

## 2016-01-11 NOTE — Assessment & Plan Note (Signed)
Download confirms excellent compliance and control. He says he won't sleep without CPAP even for naps and feels badly if he tried to. He is comfortable with current pressure. We discussed maintenance requirements and cleaning of equipment.

## 2016-01-18 DIAGNOSIS — H6123 Impacted cerumen, bilateral: Secondary | ICD-10-CM | POA: Insufficient documentation

## 2016-01-18 DIAGNOSIS — H903 Sensorineural hearing loss, bilateral: Secondary | ICD-10-CM | POA: Insufficient documentation

## 2016-01-18 DIAGNOSIS — R2681 Unsteadiness on feet: Secondary | ICD-10-CM | POA: Insufficient documentation

## 2016-01-29 ENCOUNTER — Ambulatory Visit (INDEPENDENT_AMBULATORY_CARE_PROVIDER_SITE_OTHER): Payer: Medicare Other | Admitting: Physician Assistant

## 2016-01-29 VITALS — BP 138/60 | HR 79 | Temp 97.5°F | Ht 70.0 in | Wt 216.4 lb

## 2016-01-29 DIAGNOSIS — M79672 Pain in left foot: Secondary | ICD-10-CM | POA: Diagnosis not present

## 2016-01-29 DIAGNOSIS — M79671 Pain in right foot: Secondary | ICD-10-CM

## 2016-01-29 DIAGNOSIS — R58 Hemorrhage, not elsewhere classified: Secondary | ICD-10-CM | POA: Diagnosis not present

## 2016-01-29 DIAGNOSIS — L84 Corns and callosities: Secondary | ICD-10-CM

## 2016-01-29 NOTE — Patient Instructions (Addendum)
  Corn pad - holes in the center and put it over the spots on the feet that are sore   Watch for bleeding if it happens again you should be seen again  IF you received an x-ray today, you will receive an invoice from [facility]. Please contact [facility] at [phone] with questions or concerns regarding your invoice.   IF you received labwork today, you will receive an invoice from [employer]. Please contact [facility] at [phone] with questions or concerns regarding your invoice.   Our billing staff will not be able to assist you with questions regarding bills from these companies.  You will be contacted with the lab results as soon as they are available. The fastest way to get your results is to activate your My Chart account. Instructions are located on the last page of this paperwork. If you have not heard from [place] regarding the results in 2 weeks, please contact this office.

## 2016-01-29 NOTE — Progress Notes (Signed)
[name] [name]  MRN: [id number] DOB: 1940/12/03  Subjective:  Pt presents to clinic with a pain under both of his feet for months - it is in a particular spot all the time and only hurts when he stands up.  He has no burning in his feet.  Also last pm when he got out of the shower and was moving around the bathroom there was blood on the bathroom floor but they could not find a cause of the blood.  No one else was in the bathroom with him.  He lives with his daughter and she is here today with him and she saw the blood in the bathroom but could not find a skin wound.  He did not have a bloody nose, no blood in the urine and no blood in his stool since the incident.  He is not on a blood thinner but does take a ASA 81mg  daily.    He has had some memory loss since a fall earlier this year.   Review of Systems  Neurological: Negative for dizziness.    Patient Active Problem List   Diagnosis Date Noted  . Cellulitis of left lower leg   . Left leg swelling   . Cellulitis 08/23/2014  . CHF, chronic ([place]) 04/29/2014  . Chronic kidney disease, stage 2, mildly decreased GFR   . Avitaminosis D 05/19/2010  . Depression, major, recurrent, mild ([place]) 05/19/2010  . Vertigo as late effect of stroke 04/29/2010  . Benign essential HTN 12/23/2009  . 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 Hyperlipidemia 01/18/2008  . Coronary atherosclerosis 01/18/2008  . Obstructive sleep apnea 05/28/2007  . 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 Benign prostatic hyperplasia with urinary obstruction 05/11/2005    Current Outpatient Prescriptions on File Prior to Visit  Medication Sig Dispense Refill  . Apoaequorin (PREVAGEN PO) Take 1 capsule by mouth daily.    . Ascorbic Acid (VITAMIN C) 1000 MG tablet Take 1,000 mg by mouth daily.      Marland Kitchen aspirin EC 81 MG tablet Take 81 mg by mouth daily.    . calcium-vitamin D (OSCAL WITH D) 500-200 MG-UNIT per tablet Take 1 tablet by mouth 2 (two) times daily.    .  CRESTOR 10 MG tablet TAKE 1/2 TABLET BY MOUTH EVERY OTHER DAY 30 tablet 0  . furosemide (LASIX) 20 MG tablet Take 20 mg by mouth daily. Take 20 mg daily if needed for swelling 30 tablet 6  . Glucosamine-Chondroitin (OSTEO BI-FLEX REGULAR STRENGTH PO) Take 1 tablet by mouth 2 (two) times daily.    Marland Kitchen ibuprofen (ADVIL,MOTRIN) 200 MG tablet Take 200 mg by mouth every 6 (six) hours as needed for moderate pain.    . Multiple Vitamin (MULTIVITAMIN) tablet Take 1 tablet by mouth daily.      . Omega-3 Fatty Acids (FISH OIL) 600 MG CAPS Take 1 capsule by mouth 2 (two) times daily.     Marland Kitchen OVER THE COUNTER MEDICATION Take 1 tablet by mouth 2 (two) times daily. G A M A (gets at [facility]).    . Psyllium (METAMUCIL PO) Take 5 mLs by mouth daily.     . Saw Palmetto 450 MG CAPS Take 1 capsule by mouth 2 (two) times daily.     . Theanine 100 MG CAPS Take 100 mg by mouth daily.     Marland Kitchen VALERIAN ROOT PO Take 1 tablet by mouth at bedtime as needed (muscle spasms).      No current facility-administered  medications on file prior to visit.     Allergies  Allergen Reactions  . Enablex [Darifenacin Hydrobromide Er]     PT STATES IT MAKES HIM "BONKERS" AND VERY TIRED  . Levaquin [Levofloxacin]     Caused diarheer a   . Other     Intolerance to zetia and lipitor  . Oxybutynin Other (See Comments)    Memory impairment, temperament changes  . Sulfonamide Derivatives     Pt patients past, family and social history were reviewed and updated.   Objective:  BP 138/60 (BP Location: Right Arm, Patient Position: Sitting, Cuff Size: Large)   Pulse 79   Temp 97.5 F (36.4 C) (Oral)   Ht 5\' 10"  (1.778 m)   Wt 216 lb 6.4 oz (98.2 kg)   SpO2 96%   BMI 31.05 kg/m   Physical Exam  HENT:  Right Ear: Hearing and external ear normal.  Left Ear: Hearing and external ear normal.  Nose: No nasal septal hematoma. No epistaxis.  Genitourinary: Rectal exam shows no external hemorrhoid. Discharge found.  Skin:  No wound or scabs  on his body that I could see.  Feet have no abrasions or wound.  Bilaterally under the 4th metatarsal head there is a callus or corn - when this area is pressed the patient states that is the pain that he is complaining about today.    Assessment and Plan :  Foot pain, bilateral - Plan: Ambulatory referral to Podiatry  Corn or callus - Plan: Ambulatory referral to Podiatry - corn pad to prevent pressure on the area while waiting for podiatry  Bleeding - unsure where blood came from - no wound is seen on the patient's body and he has urinated and passed stool since the incident neither with blood in it.  They will continue to monitor and if it happens again they will seek medical care and at that time some blood work can be done - I am unsure where the blood is coming from.   Patient and daughters questions are answered and they agree with the plan.  Windell Hummingbird PA-C  [facility] 01/29/2016 5:27 PM

## 2016-02-10 ENCOUNTER — Ambulatory Visit (INDEPENDENT_AMBULATORY_CARE_PROVIDER_SITE_OTHER): Payer: Medicare Other | Admitting: Podiatry

## 2016-02-10 ENCOUNTER — Encounter: Payer: Self-pay | Admitting: Podiatry

## 2016-02-10 VITALS — BP 156/85 | HR 85 | Resp 18

## 2016-02-10 DIAGNOSIS — Q828 Other specified congenital malformations of skin: Secondary | ICD-10-CM

## 2016-02-10 DIAGNOSIS — B351 Tinea unguium: Secondary | ICD-10-CM

## 2016-02-10 NOTE — Patient Instructions (Signed)
Today exam demonstrated adequate circulation and feeling in your feet There is a nucleated corn-like growth, porokeratosis that is a chronic recurrent skin growth, benign The simplest and this treatment I recommend is periodic trimming of this area and application of acid. Return as needed The thick and second left toenails associated with a history of trauma and I trimmed it for you today. Again this is the simplest treatment and could be repeated as needed. The toenail can be taken off for permanent correction if you wished however if you're having minimal symptoms I would not recommend that

## 2016-02-10 NOTE — Progress Notes (Signed)
   Subjective:    Patient ID: [name] [name], male    DOB: [birth year], 75 y.o.   MRN: [id number]  HPI      This patient presents today complaining of approximately three-month history of thickened and discolored toenails on his feet which are difficult for him to trim and is requesting toenail debridement. He describes injury to the second left toenail resulting in a deformity in that nail. He also is complaining of approximately 6 month history of a plantar callus on the right foot which is uncomfortable walking wearing shoes and requests treatment for this plantar callus. He denies podiatric care or professional care for these problems. Also mentions that his feet have occasional sharp pain and have sensation of a cold feeling intermittently. A somewhat vague in the history of these problems. He denies history of diabetes  Patient is a former smoker   Review of Systems  Constitutional: Positive for fatigue.  Respiratory: Positive for shortness of breath.   Cardiovascular: Positive for leg swelling.  Genitourinary: Positive for frequency and urgency.  Musculoskeletal: Positive for back pain.  All other systems reviewed and are negative.      Objective:   Physical Exam  Orientated 3 DP and PT pulses 2/4 bilaterally Capillary reflex immediate bilaterally Sensation to 10 g monofilament wire intact 4/5 bilaterally Vibratory  sensation reactive bilaterally Ankle reflex equal and reactive bilaterally No open skin lesions bilaterally The toenails are elongated and discolored with maximum thickness in the second left toenail 6-10 Nucleated plantar callus fourth MPJ right Manual motor testing dorsi flexion, plantar flexion, inversion, eversion 5/5 bilaterally There is no restriction or pain upon range of motion ankle, subtalar, midtarsal joints bilaterally      Assessment & Plan:   Assessment: 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 Patient verbally consents Debridement of toenails left without any bleeding Debrided porokeratosis plantar right without any bleeding

## 2016-02-17 ENCOUNTER — Ambulatory Visit: Payer: Medicare Other | Admitting: Podiatry

## 2016-03-04 ENCOUNTER — Telehealth: Payer: Self-pay | Admitting: *Deleted

## 2016-03-04 NOTE — Telephone Encounter (Signed)
Pt asked how long he would have pain in the nerve of his foot when he moves. I asked pt if he had any areas of redness, swelling or blood, and he denies, but states has two thick areas. Pt states he gets a sharp pain on occasion when stepping and is relieved when he raises the foot. I told pt he may have some problem inside of the foot, like a pinched nerve or injured capsule, I offered pt an appt and he agreed. Transferred to schedulers.

## 2016-03-07 ENCOUNTER — Ambulatory Visit (INDEPENDENT_AMBULATORY_CARE_PROVIDER_SITE_OTHER): Payer: Medicare Other | Admitting: Podiatry

## 2016-03-07 DIAGNOSIS — L84 Corns and callosities: Secondary | ICD-10-CM | POA: Diagnosis not present

## 2016-03-07 DIAGNOSIS — L851 Acquired keratosis [keratoderma] palmaris et plantaris: Secondary | ICD-10-CM

## 2016-03-07 DIAGNOSIS — M79671 Pain in right foot: Secondary | ICD-10-CM

## 2016-03-07 DIAGNOSIS — M79672 Pain in left foot: Secondary | ICD-10-CM

## 2016-03-08 NOTE — Progress Notes (Signed)
Subjective: Patient presents to the office today for chief complaint of painful callus lesions of the feet. Patient states that the pain is ongoing and is affecting their ability to ambulate without pain. Patient presents today for further treatment and evaluation.  Objective:  Physical Exam General: Alert and oriented x3 in no acute distress  Dermatology: Hyperkeratotic lesion present on the weightbearing surface of the third and fourth mpj right foot. Pain on palpation with a central nucleated core noted.  Skin is warm, dry and supple bilateral lower extremities. Negative for open lesions or macerations.  Vascular: Palpable pedal pulses bilaterally. No edema or erythema noted. Capillary refill within normal limits.  Neurological: Epicritic and protective threshold grossly intact bilaterally.   Musculoskeletal Exam: Pain on palpation at the keratotic lesion noted. Range of motion within normal limits bilateral. Muscle strength 5/5 in all groups bilateral.  Assessment: #1 painful callus lesions right forefoot x 2 #2 pain in right foot.    Plan of Care:  #1 Patient evaluated #2 Excisional debridement of  keratoic lesion using a chisel blade was performed without incident.  #3 Treated area(s) with Salinocaine and dressed with light dressing. #4 Patient is to return to the clinic PRN.   Edrick Kins, DPM [facility]  Dr. Edrick Kins, [address]                                        [place], [place] [address]                Office [phone]  Fax [phone]

## 2016-04-07 DIAGNOSIS — I872 Venous insufficiency (chronic) (peripheral): Secondary | ICD-10-CM | POA: Insufficient documentation

## 2016-04-14 DIAGNOSIS — I878 Other specified disorders of veins: Secondary | ICD-10-CM | POA: Insufficient documentation

## 2016-04-27 ENCOUNTER — Encounter (HOSPITAL_BASED_OUTPATIENT_CLINIC_OR_DEPARTMENT_OTHER): Payer: Medicare Other | Attending: Surgery

## 2016-04-27 DIAGNOSIS — G4730 Sleep apnea, unspecified: Secondary | ICD-10-CM | POA: Diagnosis not present

## 2016-04-27 DIAGNOSIS — I129 Hypertensive chronic kidney disease with stage 1 through stage 4 chronic kidney disease, or unspecified chronic kidney disease: Secondary | ICD-10-CM | POA: Insufficient documentation

## 2016-04-27 DIAGNOSIS — I872 Venous insufficiency (chronic) (peripheral): Secondary | ICD-10-CM | POA: Diagnosis not present

## 2016-04-27 DIAGNOSIS — Z87891 Personal history of nicotine dependence: Secondary | ICD-10-CM | POA: Insufficient documentation

## 2016-04-27 DIAGNOSIS — N182 Chronic kidney disease, stage 2 (mild): Secondary | ICD-10-CM | POA: Insufficient documentation

## 2016-04-27 DIAGNOSIS — I2510 Atherosclerotic heart disease of native coronary artery without angina pectoris: Secondary | ICD-10-CM | POA: Diagnosis not present

## 2016-05-05 ENCOUNTER — Other Ambulatory Visit: Payer: Self-pay

## 2016-05-05 MED ORDER — ROSUVASTATIN CALCIUM 10 MG PO TABS: 5.0000 mg | ORAL_TABLET | ORAL | 0 refills | Status: DC

## 2016-07-26 ENCOUNTER — Telehealth: Payer: Self-pay

## 2016-07-26 NOTE — Telephone Encounter (Signed)
Received medical clearance from [place].Dr.Jordan cleared patient for upcoming dental surgery.Form faxed back to fax # [phone].

## 2016-09-02 ENCOUNTER — Other Ambulatory Visit: Payer: Self-pay | Admitting: *Deleted

## 2016-09-02 MED ORDER — ROSUVASTATIN CALCIUM 10 MG PO TABS
5.0000 mg | ORAL_TABLET | ORAL | 0 refills | Status: DC
Start: 2016-09-02 — End: 2016-10-04

## 2016-09-10 NOTE — Progress Notes (Signed)
[name] [name] Date of Birth: 23-Jun-1940   History of Present Illness: [name] [name] is seen today for followup of his coronary disease. He has a history of an intermediate stenosis in the mid LAD had a 70% by angiography. Previous nuclear stress test in 2008 was normal. He underwent cardiac catheterization in 2010 with flow wire evaluation of the LAD which was normal. He has been managed medically. He also has a history of OSA followed by Dr. Annamaria Boots.  On follow up today he is doing well from a cardiac standpoint. He was admitted in January at [facility] with acute sepsis and Pseudomonas bacteremia. He had acute mental status changes with combativeness. He was febrile and tachypneic. CT of head, Abd, pelvis negative for acute findings. CT LE c/w cellulitis. Venous dopplers negative for DVT but he did have severe venous insufficiency. LE arterial dopplers were normal. Since then he has been wearing compression hose. He notes he feels woozy at times and off balance. He is on no BP meds currently. Has memory loss and is seeing Neurology. He has lost 8 lbs. Denies any chest pain currently. No change in dyspnea.   Current Outpatient Prescriptions on File Prior to Visit  Medication Sig Dispense Refill  . Apoaequorin (PREVAGEN PO) Take 1 capsule by mouth daily.    . Ascorbic Acid (VITAMIN C) 1000 MG tablet Take 1,000 mg by mouth daily.      Marland Kitchen aspirin EC 81 MG tablet Take 81 mg by mouth daily.    . calcium-vitamin D (OSCAL WITH D) 500-200 MG-UNIT per tablet Take 1 tablet by mouth 2 (two) times daily.    . furosemide (LASIX) 20 MG tablet Take 20 mg by mouth daily. Take 20 mg daily if needed for swelling 30 tablet 6  . Glucosamine-Chondroitin (OSTEO BI-FLEX REGULAR STRENGTH PO) Take 1 tablet by mouth 2 (two) times daily.    Marland Kitchen ibuprofen (ADVIL,MOTRIN) 200 MG tablet Take 200 mg by mouth every 6 (six) hours as needed for moderate pain.    . Multiple Vitamin (MULTIVITAMIN) tablet Take 1 tablet by mouth daily.      .  Omega-3 Fatty Acids (FISH OIL) 600 MG CAPS Take 1 capsule by mouth 2 (two) times daily.     Marland Kitchen OVER THE COUNTER MEDICATION Take 1 tablet by mouth 2 (two) times daily. G A M A (gets at [facility]).    . Psyllium (METAMUCIL PO) Take 5 mLs by mouth daily.     . rosuvastatin (CRESTOR) 10 MG tablet Take 0.5 tablets (5 mg total) by mouth every other day. 30 tablet 0  . Saw Palmetto 450 MG CAPS Take 1 capsule by mouth 2 (two) times daily.     . Theanine 100 MG CAPS Take 100 mg by mouth daily.     Marland Kitchen VALERIAN ROOT PO Take 1 tablet by mouth at bedtime as needed (muscle spasms).      No current facility-administered medications on file prior to visit.     Allergies  Allergen Reactions  . Darifenacin     Other reaction(s): Mental Status Changes (intolerance) PT STATES IT MAKES HIM "BONKERS" AND VERY TIRED  . Enablex [Darifenacin Hydrobromide Er]     PT STATES IT MAKES HIM "BONKERS" AND VERY TIRED  . Ceftriaxone     Other reaction(s): Other (See Comments) Unknown reaction  . Atorvastatin     Other reaction(s): Other (See Comments) Muscle aches/weakness  . Ezetimibe     Other reaction(s): Other (See Comments) Muscle aches/weakness  .  Levaquin [Levofloxacin] Diarrhea    Caused diarheer a   . Other     Intolerance to zetia and lipitor  . Oxybutynin Other (See Comments)    Other reaction(s): Mental Status Changes (intolerance) Memory impairment, temperament changes Memory impairment, temperament changes  . Sulfamethoxazole     Other reaction(s): Other (See Comments) Childhood allergy  . Sulfasalazine     Other reaction(s): Other (See Comments) Unknown per daughter  . Sulfonamide Derivatives     Past Medical History:  Diagnosis Date  . Chronic kidney disease    mild insuffiency  . Coronary artery disease    LHC 4/10: Mid LAD 40-50%, then 70%, OM1 20-30%, EF 65%. Mid LAD FFR 0.89 (not hemodynamically significant). Medical therapy was continued.  . Hyperlipidemia   . Obstructive sleep apnea      Past Surgical History:  Procedure Laterality Date  . CARDIAC CATHETERIZATION  05/02/2003   single vessel,moderate stenosis mid LAD  . CARDIAC CATHETERIZATION  06/12/2008   continue med. therapy  . right shoulder surgery      History  Smoking Status  . Former Smoker  . Years: 40.00  . Types: Pipe  . Quit date: 02/28/1978  Smokeless Tobacco  . Never Used    Comment: smoked pipe only     History  Alcohol Use No    Comment: rarely wine    Family History  Problem Relation Age of Onset  . Cancer Mother   . Hypertension Mother   . Other Father        old age  . Hypertension Father     Review of Systems: As noted in HPI. All other systems were reviewed and are negative.  Physical Exam: BP 132/66   Pulse 76   Ht 5\' 10"  (1.778 m)   Wt 208 lb (94.3 kg)   BMI 29.84 kg/m  He is an overweight WM in NAD.  The HEENT exam  is normal.  The carotids are 2+ without bruits.  There is no thyromegaly.  There is no JVD.  The lungs are clear.   The heart exam reveals a regular rate with a normal S1 and S2.  There are no murmurs, gallops, or rubs.  The PMI is not displaced.   Abdominal exam reveals good bowel sounds.  Obese.  There is no hepatosplenomegaly or tenderness.  There are no masses.  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  The distal pulses are intact.  Cranial nerves II - XII are intact.   LABORATORY DATA: Labs reviewed from primary care dated 05/18/15: normal chemistry panel, TSH, CBC. Cholesterol 166, trig- 109, HDL 44, LDL 100 Dated 08/22/16: glucose 136. Otherwise CMET normal Dated 07/07/16: Cholesterol 140, triglycerides 123, HDL 38, LDL 77. CBC and TSH normal. Dated 08/22/16: glucose 136, other CMET normal.   Assessment / Plan:  1. Coronary disease with borderline LAD stenosis. Negative ischemic evaluation the past. We will continue medical management. I'll followup in one year.  2. Hyperlipidemia. On statin. Unable to tolerate higher doses. Last  lipids looked very goog.  3. Chronic fatigue  4. Memory loss  5. Chronic venous insufficiency. Continue support hose, salt restriction.  6. History of pseudomonas bacteremia secondary to cellulitis.

## 2016-09-14 ENCOUNTER — Encounter: Payer: Self-pay | Admitting: Cardiology

## 2016-09-14 ENCOUNTER — Ambulatory Visit (INDEPENDENT_AMBULATORY_CARE_PROVIDER_SITE_OTHER): Payer: Medicare Other | Admitting: Cardiology

## 2016-09-14 VITALS — BP 132/66 | HR 76 | Ht 70.0 in | Wt 208.0 lb

## 2016-09-14 DIAGNOSIS — I2510 Atherosclerotic heart disease of native coronary artery without angina pectoris: Secondary | ICD-10-CM | POA: Diagnosis not present

## 2016-09-14 DIAGNOSIS — E7800 Pure hypercholesterolemia, unspecified: Secondary | ICD-10-CM

## 2016-09-14 DIAGNOSIS — I10 Essential (primary) hypertension: Secondary | ICD-10-CM

## 2016-09-14 NOTE — Patient Instructions (Addendum)
Continue your current therapy  I will see you in one year   

## 2016-10-04 ENCOUNTER — Encounter: Payer: Self-pay | Admitting: Cardiology

## 2016-10-04 ENCOUNTER — Other Ambulatory Visit: Payer: Self-pay | Admitting: Cardiology

## 2016-10-04 MED ORDER — ROSUVASTATIN CALCIUM 10 MG PO TABS: 5.0000 mg | ORAL_TABLET | ORAL | 1 refills | Status: DC

## 2016-10-04 MED ORDER — ROSUVASTATIN CALCIUM 10 MG PO TABS: 5.0000 mg | ORAL_TABLET | ORAL | 3 refills | Status: DC

## 2016-10-04 NOTE — Telephone Encounter (Signed)
Returned call to pharmacy, rx sent over for [place] supply a few days ago; pharmacist was requesting 90 day supply. Verbal given for 90 day with 1 additional refill. Pharmacist voiced understanding.

## 2016-10-04 NOTE — Addendum Note (Signed)
Addended by: Kathyrn Lass on: 10/04/2016 03:01 PM   Modules accepted: Orders

## 2016-10-04 NOTE — Telephone Encounter (Signed)
Crestor 90 day refill sent to pharmacy.

## 2016-10-04 NOTE — Telephone Encounter (Signed)
New message   Pharmacy calling because insurance approved a 90 day supply. [facility] wants to know if Dr. Martinique also approves 90 day refills so that they can provide this for the patient. This is for rosuvastatin (CRESTOR) 10 MG tablet

## 2016-12-05 ENCOUNTER — Telehealth: Payer: Self-pay | Admitting: Internal Medicine

## 2016-12-05 DIAGNOSIS — G4733 Obstructive sleep apnea (adult) (pediatric): Secondary | ICD-10-CM

## 2016-12-05 NOTE — Telephone Encounter (Signed)
Spoke with patient. He is aware of the order being sent to [facility]. Patient verbalized understanding. Nothing else needed at time of call.

## 2016-12-05 NOTE — Telephone Encounter (Signed)
Spoke with patient. He is aware of the order. He now wants to switch to [facility] if possible.   CY, are you ok with this? Please advise. Thanks!

## 2016-12-05 NOTE — Telephone Encounter (Signed)
Order DME [facility]- replace broken CPAP machine, change to auto 5-15, mask of choice, humidifier, supplies, AirView      Dx OSA

## 2016-12-05 NOTE — Telephone Encounter (Signed)
Ok

## 2016-12-05 NOTE — Telephone Encounter (Signed)
Spoke with patient. He stated that his current CPAP machine has stopped working. He said that the machine started making a very loud noise around 2am a few weeks ago and hasn't worked since. He is using his old machine in the meantime. He wants to know if CY would send a new order to [facility] for him to have a replacement machine.   Download has been printed from [employer]. Will place on CY's cart for review.   CY, please advise. Thanks!

## 2016-12-06 ENCOUNTER — Telehealth: Payer: Self-pay | Admitting: Internal Medicine

## 2016-12-06 NOTE — Telephone Encounter (Signed)
Spoke with Corene Cornea with [facility], who states pt will need OV for usage and benefits of cpap before cpap replacement. Pt has been scheduled for OV with CY on 02/06/17 at 9:00. Nothing further needed.

## 2016-12-07 ENCOUNTER — Ambulatory Visit (INDEPENDENT_AMBULATORY_CARE_PROVIDER_SITE_OTHER): Payer: Medicare Other | Admitting: Internal Medicine

## 2016-12-07 ENCOUNTER — Encounter: Payer: Self-pay | Admitting: Internal Medicine

## 2016-12-07 VITALS — BP 116/70 | HR 81 | Ht 70.0 in | Wt 208.0 lb

## 2016-12-07 DIAGNOSIS — G4733 Obstructive sleep apnea (adult) (pediatric): Secondary | ICD-10-CM | POA: Diagnosis not present

## 2016-12-07 DIAGNOSIS — G300 Alzheimer's disease with early onset: Secondary | ICD-10-CM | POA: Diagnosis not present

## 2016-12-07 DIAGNOSIS — H6123 Impacted cerumen, bilateral: Secondary | ICD-10-CM

## 2016-12-07 DIAGNOSIS — F0280 Dementia in other diseases classified elsewhere without behavioral disturbance: Secondary | ICD-10-CM

## 2016-12-07 DIAGNOSIS — G309 Alzheimer's disease, unspecified: Secondary | ICD-10-CM

## 2016-12-07 NOTE — Progress Notes (Signed)
HPI male former smoker followed for OSA, complicated by CAD, CHF, venous stasis dermatitis, chronic renal disease PFT: 12/16/2011-within normal limits. Small airway flows were quite normal but improved with bronchodilator. FEV1 3.03/107%, FEV1/FVC 0.82, DLCO 0.89. NPSG 12/25/91- AHI 19.3/ hr, desat to 66%, body weight 206 lbs ------------------------------------------------------------------------------------------------  01/11/2016-76 year old male former smoker followed for OSA, complicated by CAD, chronic renal disease CPAP 10/[facility] FOLLOWS FOR:D[facility]. Pt wears CPAP nighlty and at nap times. DL attached. No new supplies needed at this time and pressure works well for patient. Prefers his old CPAP machine over the newer one. Very comfortable with his current pressure. Never sleeps without CPAP and says he feels bad if he doesn't wear it. No new general medical issues reported.  12/07/16- 76 year old male former smoker followed for OSA, complicated by CAD, chronic renal disease CPAP 10/[facility]> replace and change to auto 5-15 today OSA: DME [facility]. Pt needs new CPAP machine as his is broken. DL shows 3 days use due to being broken.  DME required this visit per insurance regs, before they could replace machine.  Remote pipe smoker, stopped about 10 years ago. Little cough or wheeze and no inhalers. Hospitalized with sepsis at [facility] in January. More recently diagnosed with Alzheimer's He is using old CPAP machine, prefers  Respironics brand because he found them more reliable. Wants to resume with [facility]. Depends on CPAP and anxious to get supplies and working machine. Complains of stuffiness left ear. CXR 11/08/15 No acute cardiopulmonary disease.  ROS-see HPI  + = positive Constitutional:   No-   weight loss, night sweats, fevers, chills, fatigue, lassitude. HEENT:   No-  headaches, difficulty swallowing, tooth/dental problems, sore throat,       No-  sneezing, itching,  ear ache, nasal congestion, post nasal drip,  CV:  No-   chest pain, orthopnea, PND, swelling in lower extremities, anasarca,                                           dizziness, palpitations Resp: + shortness of breath with exertion or at rest.              No-   productive cough,  No non-productive cough,  No- coughing up of blood.              No-   change in color of mucus.  No- wheezing.   Skin: No-   rash or lesions. GI:   GU:  MS:  No-   joint pain or swelling.   Neuro-     nothing unusual Psych:  No- change in mood or affect. No depression or anxiety.  No memory loss.  OBJ General- Alert, Oriented, Affect-appropriate, Distress- none acute. +Overweight Skin- rash-none, lesions- none, excoriation- none Lymphadenopathy- none Head- atraumatic            Eyes- Gross vision intact, PERRLA, +Strabismus, conjunctivae clear secretions            Ears- Hearing aids,  Cerumen occluding R canal            Nose- Clear, no-Septal dev, mucus, polyps, erosion, perforation             Throat- Mallampati IV , mucosa clear , drainage- none, tonsils- atrophic Neck- flexible , trachea midline, no stridor , thyroid nl, carotid no bruit Chest - symmetrical excursion , unlabored  Heart/CV- RRR , no murmur , no gallop  , no rub, nl s1 s2                           - JVD- none , edema- none, stasis changes- none, varices- none           Lung- clear to P&A, wheeze- none, cough- none , dullness-none, rub- none           Chest wall-  Abd-  Br/ Gen/ Rectal- Not done, not indicated Extrem- cyanosis- none, clubbing, none, atrophy- none, strength- nl Neuro- grossly intact to observation

## 2016-12-07 NOTE — Patient Instructions (Signed)
Order DME- [facility] replace broken CPAP- prefers Respironics brand, auto 5-15, mask of choice, humidifier, supplies, AirView    Dx OSA   For the ear wax- suggest an over the counter ear wax kit such as Cerumenex  Please call if we can help

## 2016-12-07 NOTE — Assessment & Plan Note (Signed)
He has been compliant CPAP user, definitely benefiting. Now using an old backup machine which doesn't record for download while waiting for replacement of broken machine. Plan-replacement for broken CPAP machine, AutoPap 5-15

## 2016-12-07 NOTE — Assessment & Plan Note (Signed)
Discussed use of OTC earwax kits

## 2016-12-07 NOTE — Assessment & Plan Note (Signed)
Patient reports diagnosis followed by neurology

## 2017-01-10 ENCOUNTER — Ambulatory Visit: Payer: Medicare Other | Admitting: Internal Medicine

## 2017-03-10 ENCOUNTER — Encounter: Payer: Self-pay | Admitting: Internal Medicine

## 2017-03-10 ENCOUNTER — Ambulatory Visit: Payer: Medicare Other | Admitting: Internal Medicine

## 2017-03-10 VITALS — BP 120/78 | HR 68 | Ht 70.0 in | Wt 212.4 lb

## 2017-03-10 DIAGNOSIS — F0280 Dementia in other diseases classified elsewhere without behavioral disturbance: Secondary | ICD-10-CM

## 2017-03-10 DIAGNOSIS — H903 Sensorineural hearing loss, bilateral: Secondary | ICD-10-CM

## 2017-03-10 DIAGNOSIS — G308 Other Alzheimer's disease: Secondary | ICD-10-CM | POA: Diagnosis not present

## 2017-03-10 DIAGNOSIS — G4733 Obstructive sleep apnea (adult) (pediatric): Secondary | ICD-10-CM | POA: Diagnosis not present

## 2017-03-10 NOTE — Patient Instructions (Addendum)
  Order- change DME at patient request to [facility] We can continue CPAP 10, mask of choice, humidifier, supplies, AirView  If your ears continue to bother you, suggest you go to an ENT doctor to get them checked.  Please call if we can help

## 2017-03-10 NOTE — Assessment & Plan Note (Signed)
He reports memory gradually getting worse but still able to drive himself around.  He seemed to remember details appropriately during our conversation today.

## 2017-03-10 NOTE — Assessment & Plan Note (Signed)
Download documents excellent compliance and control and he reports faithful use of CPAP, definitely sleeping better with it.  He wants to change DME companies for the stated reason that he wants access to Respironics products.  We are continuing auto 5-15.

## 2017-03-10 NOTE — Progress Notes (Signed)
HPI male former smoker followed for OSA, complicated by CAD, CHF, venous stasis dermatitis, chronic renal disease PFT: 12/16/2011-within normal limits. Small airway flows were quite normal but improved with bronchodilator. FEV1 3.03/107%, FEV1/FVC 0.82, DLCO 0.89. NPSG 12/25/91- AHI 19.3/ hr, desat to 66%, body weight 206 lbs ------------------------------------------------------------------------------------------------  12/07/16- 77 year old male former smoker followed for OSA, complicated by CAD, chronic renal disease CPAP 10/[facility]> replace and change to auto 5-15 today OSA: DME [facility]. Pt needs new CPAP machine as his is broken. DL shows 3 days use due to being broken.  DME required this visit per insurance regs, before they could replace machine.  Remote pipe smoker, stopped about 10 years ago. Little cough or wheeze and no inhalers. Hospitalized with sepsis at [facility] in January. More recently diagnosed with Alzheimer's He is using old CPAP machine, prefers  Respironics brand because he found them more reliable. Wants to resume with [facility]. Depends on CPAP and anxious to get supplies and working machine. Complains of stuffiness left ear. CXR 11/08/15 No acute cardiopulmonary disease.  03/10/17- 77 year old male former smoker followed for OSA, complicated by CAD, chronic renal disease, dementia/ Alzheimers CPAP auto 5-15/[facility]>> [facility] today Mentation is getting worse but he is still driving.  Adult daughter helps with meals. He says he prefers Respironics equipment but [facility] only carries Res[employer] Download 100% compliance, AHI 1.6/hour Complains ears feel stopped up intermittently and he has some balance problems.  Wears bilateral hearing aids.  ROS-see HPI  + = positive Constitutional:   No-   weight loss, night sweats, fevers, chills, fatigue, lassitude. HEENT:   No-  headaches, difficulty swallowing, tooth/dental problems, sore throat,       No-  sneezing, itching, ear ache,  nasal congestion, post nasal drip,  CV:  No-   chest pain, orthopnea, PND, swelling in lower extremities, anasarca,                                           dizziness, palpitations Resp: + shortness of breath with exertion or at rest.              No-   productive cough,  No non-productive cough,  No- coughing up of blood.              No-   change in color of mucus.  No- wheezing.   Skin: No-   rash or lesions. GI:   GU:  MS:  No-   joint pain or swelling.   Neuro-     nothing unusual Psych:  No- change in mood or affect. No depression or anxiety.  + memory loss.  OBJ General- Alert, Oriented, Affect-appropriate, Distress- none acute. +Overweight Skin- rash-none, lesions- none, excoriation- none Lymphadenopathy- none Head- atraumatic            Eyes- Gross vision intact, PERRLA, +Strabismus, conjunctivae clear secretions            Ears- Hearing aids, + scarring both TMs            Nose- Clear, no-Septal dev, mucus, polyps, erosion, perforation             Throat- Mallampati IV , mucosa clear , drainage- none, tonsils- atrophic Neck- flexible , trachea midline, no stridor , thyroid nl, carotid no bruit Chest - symmetrical excursion , unlabored           Heart/CV-  RRR , no murmur , no gallop  , no rub, nl s1 s2                           - JVD- none , edema- none, stasis changes- none, varices- none           Lung- clear to P&A, wheeze- none, cough- none , dullness-none, rub- none           Chest wall-  Abd-  Br/ Gen/ Rectal- Not done, not indicated Extrem- cyanosis- none, clubbing, none, atrophy- none, strength- nl Neuro- grossly intact to observation

## 2017-03-10 NOTE — Assessment & Plan Note (Signed)
He complains of his ears.  I recommended he see ENT.

## 2017-05-19 ENCOUNTER — Telehealth: Payer: Self-pay | Admitting: Internal Medicine

## 2017-05-19 DIAGNOSIS — G4733 Obstructive sleep apnea (adult) (pediatric): Secondary | ICD-10-CM

## 2017-05-19 NOTE — Telephone Encounter (Signed)
Pt is requesting to switch DME to [facility] in [place]- pt is moving to [place] later this year.  Order placed to switch companies.  Nothing further needed.

## 2018-01-09 ENCOUNTER — Other Ambulatory Visit: Payer: Self-pay

## 2018-01-09 MED ORDER — ROSUVASTATIN CALCIUM 10 MG PO TABS: 5.0000 mg | ORAL_TABLET | ORAL | 0 refills | Status: DC

## 2018-03-08 ENCOUNTER — Other Ambulatory Visit: Payer: Self-pay | Admitting: *Deleted

## 2018-03-08 MED ORDER — ROSUVASTATIN CALCIUM 10 MG PO TABS: 5.0000 mg | ORAL_TABLET | ORAL | 0 refills | Status: DC

## 2018-03-12 ENCOUNTER — Ambulatory Visit: Payer: Medicare Other | Admitting: Internal Medicine

## 2018-03-12 ENCOUNTER — Encounter: Payer: Self-pay | Admitting: Internal Medicine

## 2018-03-12 DIAGNOSIS — F0280 Dementia in other diseases classified elsewhere without behavioral disturbance: Secondary | ICD-10-CM

## 2018-03-12 DIAGNOSIS — G308 Other Alzheimer's disease: Secondary | ICD-10-CM

## 2018-03-12 DIAGNOSIS — G4733 Obstructive sleep apnea (adult) (pediatric): Secondary | ICD-10-CM | POA: Diagnosis not present

## 2018-03-12 NOTE — Assessment & Plan Note (Signed)
He continues treatment by neurology.  Daughter works, but serves as a Animal nutritionist.  He was able to answer questions appropriately at today's visit.

## 2018-03-12 NOTE — Progress Notes (Signed)
HPI male former smoker followed for OSA, complicated by CAD, CHF, venous stasis dermatitis, chronic renal disease PFT: 12/16/2011-within normal limits. Small airway flows were quite normal but improved with bronchodilator. FEV1 3.03/107%, FEV1/FVC 0.82, DLCO 0.89. NPSG 12/25/91- AHI 19.3/ hr, desat to 66%, body weight 206 lbs ------------------------------------------------------------------------------------------------  03/10/17- 78 year old male former smoker followed for OSA, complicated by CAD, chronic renal disease, dementia/ Alzheimers CPAP auto 5-15/[facility] today Mentation is getting worse but he is still driving.  Adult daughter helps with meals. He says he prefers Respironics equipment but [facility] only carries Resmed Download 100% compliance, AHI 1.6/hour Complains ears feel stopped up intermittently and he has some balance problems.  Wears bilateral hearing aids.  03/11/2018- 78 year old male former smoker followed for OSA, complicated by CAD, chronic renal disease, dementia/ Alzheimers CPAP auto 5-15/[facility] ([place]) new machine in past year VFI:EPPI Pr. is good,full face mask,sleeps with avg. 5-7 hrs. each night No download available today Body weight today 209 pounds He went with the homecare company that provided the CPAP brand he wanted.  He reports doing well.  Comfortable with pressure and denies concerns. He indicates that his memory continues to decline.  Daughter brought him but chose to wait in the waiting room.  ROS-see HPI  + = positive Constitutional:   No-   weight loss, night sweats, fevers, chills, fatigue, lassitude. HEENT:   No-  headaches, difficulty swallowing, tooth/dental problems, sore throat,       No-  sneezing, itching, ear ache, nasal congestion, post nasal drip,  CV:  No-   chest pain, orthopnea, PND, swelling in lower extremities, anasarca,                                           dizziness, palpitations Resp: + shortness of breath with  exertion or at rest.              No-   productive cough,  No non-productive cough,  No- coughing up of blood.              No-   change in color of mucus.  No- wheezing.   Skin: No-   rash or lesions. GI:   GU:  MS:  No-   joint pain or swelling.   Neuro-     Memory decline- Alzheimers Psych:  No- change in mood or affect. No depression or anxiety.  + memory loss.  OBJ General- Alert, Oriented, Affect-appropriate, Distress- none acute. +Overweight Skin- rash-none, lesions- none, excoriation- none Lymphadenopathy- none Head- atraumatic            Eyes- Gross vision intact, PERRLA, +Strabismus, conjunctivae clear secretions            Ears- Hearing aids, + scarring both TMs            Nose- Clear, no-Septal dev, mucus, polyps, erosion, perforation             Throat- Mallampati IV , mucosa clear , drainage- none, tonsils- atrophic Neck- flexible , trachea midline, no stridor , thyroid nl, carotid no bruit Chest - symmetrical excursion , unlabored           Heart/CV- RRR , no murmur , no gallop  , no rub, nl s1 s2                           -  JVD- none , edema- none, stasis changes- none, varices- none           Lung- clear to P&A, wheeze- none, cough- none , dullness-none, rub- none           Chest wall-  Abd-  Br/ Gen/ Rectal- Not done, not indicated Extrem- cyanosis- none, clubbing, none, atrophy- none, strength- nl Neuro- grossly intact to observation

## 2018-03-12 NOTE — Patient Instructions (Signed)
We can continue CPAP auto 5-15, mask of choice, humidifier, supplies, AirView or download card  Please call if we can help

## 2018-03-12 NOTE — Assessment & Plan Note (Signed)
He describes good compliance and control, says supplies are being kept up okay and he denies concerns.  Feels better and continues to benefit using CPAP.

## 2018-05-10 ENCOUNTER — Other Ambulatory Visit: Payer: Self-pay

## 2018-05-10 MED ORDER — ROSUVASTATIN CALCIUM 10 MG PO TABS: 5.0000 mg | ORAL_TABLET | ORAL | 0 refills | Status: DC

## 2018-05-10 NOTE — Telephone Encounter (Signed)
Rx(s) sent to pharmacy electronically.  

## 2018-06-08 ENCOUNTER — Other Ambulatory Visit: Payer: Self-pay

## 2018-06-08 MED ORDER — ROSUVASTATIN CALCIUM 10 MG PO TABS: 5.0000 mg | ORAL_TABLET | ORAL | 0 refills | Status: DC

## 2018-06-29 DIAGNOSIS — I447 Left bundle-branch block, unspecified: Secondary | ICD-10-CM

## 2018-06-29 HISTORY — DX: Left bundle-branch block, unspecified: I44.7

## 2018-07-16 ENCOUNTER — Telehealth: Payer: Self-pay | Admitting: Cardiology

## 2018-07-16 NOTE — Telephone Encounter (Signed)
Returned call to patient's daughter Elzie Rings.Dr.Jordan's recommendation given.Daughter requested a virtual visit this week.Dr.Jordan's schedule is full.Virtual visit scheduled with Jory Sims DNP 5/20 at 2:00 pm.Patient gave permission for a virtual appointment.

## 2018-07-16 NOTE — Telephone Encounter (Signed)
He is not orthostatic and is in normal rhythm. If he does have a LBBB that is new compared to 2017. Not seen since July 2018. I think he can be seen as a virtual visit in next 2-3 weeks and we can see what needs to be done.  Elycia Woodside Martinique MD, [facility]

## 2018-07-16 NOTE — Telephone Encounter (Signed)
Spoke with daughter who report pt was seen by his PCP today for c/o dizziness. 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 She state PCP recommended pt follow up with Cardiologist asap and daughter requesting appointment to be scheduled with Dr. Martinique. Will route message to MD and nurse.

## 2018-07-16 NOTE — Telephone Encounter (Signed)
Patient's daughter called stating her father had and EKG done today by the PCP and they want Mr Urbas seen by [place] ASAP in the office. She would from him to be seen Dr. Martinique in the office.

## 2018-07-17 ENCOUNTER — Telehealth: Payer: Self-pay | Admitting: Adult Health

## 2018-07-17 NOTE — Telephone Encounter (Signed)
Mychart, smartphone, consent (verbal), pre reg complete 07/17/18 AF

## 2018-07-17 NOTE — Progress Notes (Signed)
Virtual Visit via Telephone Note   This visit type was conducted due to national recommendations for restrictions regarding the COVID-19 Pandemic (e.g. social distancing) in an effort to limit this patient's exposure and mitigate transmission in our community.  Due to his co-morbid illnesses, this patient is at least at moderate risk for complications without adequate follow up.  This format is felt to be most appropriate for this patient at this time.  The patient did not have access to video technology/had technical difficulties with video requiring transitioning to audio format only (telephone).  All issues noted in this document were discussed and addressed.  No physical exam could be performed with this format.  Please refer to the patient's chart for his  consent to telehealth for [facility].   Date:  07/18/2018   ID:  [name] [name], DOB [birth year], MRN [id number]  Patient Location: Home Provider Location: Home  PCP:  Chesley Noon, MD  Cardiologist:  Dr.Jordan  Electrophysiologist:  None   Evaluation Performed:  Follow-Up Visit  Chief Complaint:  Abnormal EKG with Dizziness   History of Present Illness:    [name] [name] is a 78 y.o. male with known history of CAD with intermediate stenosis of the mid LAD and had a 70% stenosis per angiography. Cardiac cath in 2010 with flow wire evaluation of the LAD which was normal. He was managed medically at that time. He is being seen by neurology due to mental status changes in the setting of sepsis in 2018 with admission to [facility]. He is also followed by Dr. Annamaria Boots, pulmonology for chronic dyspnea and OSA. Other history includes chronic renal disease, dementia and Alzheimers.   He was seen by his PCP today due to complaints of dizziness. He was not orthostatic but he had reported abnormal EKG with new LBBB. He was recommended to be seen by cardiology.  He states that his energy is lower and he is having dizziness when he stands. His  daughter is worried that he continues to have these symptoms. He denies palpitations, irregular HR or diaphoresis with the dizziness. He denies significant DOE, but has not been active lately due to the dizziness.   The patient does not have symptoms concerning for COVID-19 infection (fever, chills, cough, or new shortness of breath).    Past Medical History:  Diagnosis Date  . Chronic kidney disease    mild insuffiency  . Coronary artery disease    LHC 4/10: Mid LAD 40-50%, then 70%, OM1 20-30%, EF 65%. Mid LAD FFR 0.89 (not hemodynamically significant). Medical therapy was continued.  . Hyperlipidemia   . Obstructive sleep apnea    Past Surgical History:  Procedure Laterality Date  . CARDIAC CATHETERIZATION  05/02/2003   single vessel,moderate stenosis mid LAD  . CARDIAC CATHETERIZATION  06/12/2008   continue med. therapy  . right shoulder surgery       Current Meds  Medication Sig  . amLODipine (NORVASC) 10 MG tablet Take 10 mg by mouth daily.  . Ascorbic Acid (VITAMIN C) 1000 MG tablet Take 1,000 mg by mouth daily.    Marland Kitchen aspirin EC 81 MG tablet Take 81 mg by mouth daily.  . calcium-vitamin D (OSCAL WITH D) 500-200 MG-UNIT per tablet Take 1 tablet by mouth 2 (two) times daily.  Marland Kitchen donepezil (ARICEPT) 10 MG tablet Take 1 tablet by mouth daily.  . fluticasone (FLONASE) 50 MCG/ACT nasal spray Place 2 sprays into both nostrils daily.  . furosemide (LASIX) 20 MG tablet  Take 20 mg by mouth daily. Take 20 mg daily if needed for swelling  . gabapentin (NEURONTIN) 100 MG capsule Take 100 mg by mouth at bedtime.  Marland Kitchen LISINOPRIL PO Take by mouth. Take 1 tablet daily by mouth.  . Multiple Vitamin (MULTIVITAMIN) tablet Take 1 tablet by mouth daily.    . Omega-3 Fatty Acids (FISH OIL) 600 MG CAPS Take 1 capsule by mouth 2 (two) times daily.   . Psyllium (METAMUCIL PO) Take 5 mLs by mouth daily.   . rosuvastatin (CRESTOR) 10 MG tablet Take 0.5 tablets (5 mg total) by mouth every other day. NEEDS  APPOINTMENT FOR FUTURE REFILLS  . Saw Palmetto 450 MG CAPS Take 1 capsule by mouth 2 (two) times daily.   . Theanine 100 MG CAPS Take 100 mg by mouth daily.      Allergies:   Darifenacin; Enablex [darifenacin hydrobromide er]; Ceftriaxone; Atorvastatin; Ezetimibe; Levaquin [levofloxacin]; Other; Oxybutynin; Sulfamethoxazole; Sulfasalazine; and Sulfonamide derivatives   Social History   Tobacco Use  . Smoking status: Former Smoker    Years: 40.00    Types: Pipe    Last attempt to quit: 02/28/1978    Years since quitting: 40.4  . Smokeless tobacco: Never Used  . Tobacco comment: smoked pipe only   Substance Use Topics  . Alcohol use: No    Alcohol/week: 0.0 standard drinks    Comment: rarely wine  . Drug use: No     Family Hx: The patient's family history includes Cancer in his mother; Hypertension in his father and mother; Other in his father.  ROS:   Please see the history of present illness.    All other systems reviewed and are negative.   Prior CV studies:     Labs/Other Tests and Data Reviewed:    EKG:  No ECG reviewed.  I do not have a copy of the EKG from PCP office to review.   Recent Labs: No results found for requested labs within last 8760 hours.   Recent Lipid Panel No results found for: CHOL, TRIG, HDL, CHOLHDL, LDLCALC, LDLDIRECT  Wt Readings from Last 3 Encounters:  07/18/18 201 lb (91.2 kg)  03/12/18 209 lb 9.6 oz (95.1 kg)  03/10/17 212 lb 6.4 oz (96.3 kg)     Objective:    Vital Signs:  Ht 5\' 10"  (1.778 m)   Wt 201 lb (91.2 kg)   BMI 28.84 kg/m    VITAL SIGNS:  reviewed GEN:  no acute distress NEURO:  alert and oriented x 3, no obvious focal deficit PSYCH:  normal affect  ASSESSMENT & PLAN:    1. Dizziness: Continues daily. He was not orthostatic in PCP office but continues to have daily dizziness with position change. He will have carotid dopplers to evaluate for peripheral atherosclerotic disease with know history of CAD.   2. CAD:  Most recent cardiac cath in 2010 revealed LAD disease 40%-50% then 70% treated medically. With new LBBB will have a Lexiscan completed. With CKD stage II want to avoid injection of contrast dye unless cath is necessary. I have discussed this with the patient and his daughter who verbalize understanding. His daughter will need to be with him due to his dementia.   3. Hypertension: BP is controlled currently per vitals in the PCP office, no changes in medications at this time.   4. Alzheimer's : He is on Aricept which can cause bradycardia. Consider a cardiac monitor if ischemic testing is negative, to evaluate dizziness being caused by  this medication.   COVID-19 Education: The signs and symptoms of COVID-19 were discussed with the patient and how to seek care for testing (follow up with PCP or arrange E-visit).  The importance of social distancing was discussed today.  Time:   Today, I have spent 25 minutes with the patient with telehealth technology discussing the above problems.     Medication Adjustments/Labs and Tests Ordered: Current medicines are reviewed at length with the patient today.  Concerns regarding medicines are outlined above.   Tests Ordered: No orders of the defined types were placed in this encounter.   Medication Changes: No orders of the defined types were placed in this encounter.   Disposition:  Follow up 1 week to 10 days with Dr. Martinique  Signed, Phill Myron. West Pugh, ANP, AACC  07/18/2018 2:15 PM    [facility]

## 2018-07-18 ENCOUNTER — Telehealth (INDEPENDENT_AMBULATORY_CARE_PROVIDER_SITE_OTHER): Payer: Medicare Other | Admitting: Adult Health

## 2018-07-18 VITALS — Ht 70.0 in | Wt 201.0 lb

## 2018-07-18 DIAGNOSIS — I447 Left bundle-branch block, unspecified: Secondary | ICD-10-CM

## 2018-07-18 DIAGNOSIS — R5383 Other fatigue: Secondary | ICD-10-CM

## 2018-07-18 DIAGNOSIS — I10 Essential (primary) hypertension: Secondary | ICD-10-CM

## 2018-07-18 DIAGNOSIS — R42 Dizziness and giddiness: Secondary | ICD-10-CM

## 2018-07-18 DIAGNOSIS — I2510 Atherosclerotic heart disease of native coronary artery without angina pectoris: Secondary | ICD-10-CM

## 2018-07-18 NOTE — Patient Instructions (Signed)
Medication Instructions:  The current medical regimen is effective;  continue present plan and medications.  If you need a refill on your cardiac medications before your next appointment, please call your pharmacy.   Testing/Procedures: Your physician has requested that you have an [place]. A cardiac stress test is a cardiological test that measures the heart's ability to respond to external stress in a controlled clinical environment. For further information please visit [email or web address]. If you have questions or concerns about your appointment, you can call the Nuclear Lab at [phone].   Your physician has requested that you have a carotid duplex. This test is an ultrasound of the carotid arteries in your neck. It looks at blood flow through these arteries that supply the brain with blood. Allow one hour for this exam. There are no restrictions or special instructions.   Follow-Up: At [facility], you and your health needs are our priority.  As part of our continuing mission to provide you with exceptional heart care, we have created designated Provider Care Teams.  These Care Teams include your primary Cardiologist (physician) and Advanced Practice Providers (APPs -  Physician Assistants and Nurse Practitioners) who all work together to provide you with the care you need, when you need it. . Follow up in 1 week with Dr.Jordan after Myoview (please allow daughter with him during this visit due to dementia)

## 2018-07-19 ENCOUNTER — Telehealth (HOSPITAL_COMMUNITY): Payer: Self-pay

## 2018-07-19 NOTE — Telephone Encounter (Signed)
Encounter complete. 

## 2018-07-20 ENCOUNTER — Other Ambulatory Visit: Payer: Self-pay

## 2018-07-20 ENCOUNTER — Ambulatory Visit (HOSPITAL_COMMUNITY)
Admission: RE | Admit: 2018-07-20 | Discharge: 2018-07-20 | Disposition: A | Discharge status: Home / Self Care | Payer: Medicare Other | Source: Ambulatory Visit | Attending: Cardiology | Admitting: Cardiology

## 2018-07-20 ENCOUNTER — Ambulatory Visit (HOSPITAL_BASED_OUTPATIENT_CLINIC_OR_DEPARTMENT_OTHER)
Admission: RE | Admit: 2018-07-20 | Discharge: 2018-07-20 | Disposition: A | Discharge status: Home / Self Care | Payer: Medicare Other | Source: Ambulatory Visit | Attending: Adult Health | Admitting: Adult Health

## 2018-07-20 DIAGNOSIS — I447 Left bundle-branch block, unspecified: Secondary | ICD-10-CM

## 2018-07-20 DIAGNOSIS — R42 Dizziness and giddiness: Secondary | ICD-10-CM | POA: Insufficient documentation

## 2018-07-20 DIAGNOSIS — R5383 Other fatigue: Secondary | ICD-10-CM | POA: Insufficient documentation

## 2018-07-20 LAB — MYOCARDIAL PERFUSION IMAGING
LV dias vol: 79 mL (ref 62–150)
LV sys vol: 29 mL
Peak HR: 90 {beats}/min
Rest HR: 60 {beats}/min
SDS: 1
SRS: 4
SSS: 5
TID: 1.22

## 2018-07-20 MED ORDER — TECHNETIUM TC 99M TETROFOSMIN IV KIT
8.2000 | PACK | Freq: Once | INTRAVENOUS | Status: AC | PRN
  Administered 2018-07-20: 8.2 via INTRAVENOUS
  Filled 2018-07-20: qty 9

## 2018-07-20 MED ORDER — TECHNETIUM TC 99M TETROFOSMIN IV KIT
25.5000 | PACK | Freq: Once | INTRAVENOUS | Status: AC | PRN
  Administered 2018-07-20: 25.5 via INTRAVENOUS
  Filled 2018-07-20: qty 26

## 2018-07-20 MED ORDER — REGADENOSON 0.4 MG/5ML IV SOLN
0.4000 mg | Freq: Once | INTRAVENOUS | Status: AC
  Administered 2018-07-20: 0.4 mg via INTRAVENOUS

## 2018-07-24 ENCOUNTER — Telehealth: Payer: Self-pay | Admitting: Cardiology

## 2018-07-24 NOTE — Telephone Encounter (Signed)
New Message          Patient's is calling daughter is to see why her father can't see Dr. Martinique, patient appointment was moved twice, she would like a call back after 11:00 so that she can speak to someone.

## 2018-07-25 NOTE — Telephone Encounter (Signed)
Spoke to patient's daughter [name] [name].Virtual appointment scheduled with Dr.Jordan 07/30/18 at 3:30 pm.Patient gave permission for a virtual appointment.

## 2018-07-26 ENCOUNTER — Ambulatory Visit: Payer: Medicare Other | Admitting: Cardiology

## 2018-07-26 ENCOUNTER — Ambulatory Visit: Payer: Medicare Other | Admitting: Internal Medicine

## 2018-07-27 ENCOUNTER — Telehealth: Payer: Self-pay | Admitting: Cardiology

## 2018-07-27 NOTE — Progress Notes (Signed)
Virtual Visit via Telephone Note   This visit type was conducted due to national recommendations for restrictions regarding the COVID-19 Pandemic (e.g. social distancing) in an effort to limit this patient's exposure and mitigate transmission in our community.  Due to his co-morbid illnesses, this patient is at least at moderate risk for complications without adequate follow up.  This format is felt to be most appropriate for this patient at this time.  The patient did not have access to video technology/had technical difficulties with video requiring transitioning to audio format only (telephone).  All issues noted in this document were discussed and addressed.  No physical exam could be performed with this format.  Please refer to the patient's chart for his  consent to telehealth for [facility].   Date:  07/30/2018   ID:  TARO HIDROGO, DOB 1940/09/12, MRN [id number]  Patient Location: Home Provider Location: Home  PCP:  Chesley Noon, MD  Cardiologist:  Naleah Kofoed Martinique MD Electrophysiologist:  None   Evaluation Performed:  Follow-Up Visit  Chief Complaint:  Dizziness  History of Present Illness:    LANTZ HERMANN is a 78 y.o. male with known history of CAD with intermediate stenosis of the mid LAD and had a 70% stenosis per angiography. Cardiac cath in 2010 with flow wire evaluation of the LAD which was normal. He was managed medically at that time. He is being seen by neurology due to mental status changes in the setting of sepsis in 2018 with admission to [facility]. He is also followed by Dr. Annamaria Boots, pulmonology for chronic dyspnea and OSA. Other history includes chronic renal disease and Alzheimers.   He was seen by his PCP due to complaints of dizziness. He was not orthostatic but he had reported abnormal EKG with new LBBB. He was recommended to be seen by cardiology. He was seen by Jory Sims FNP earlier this month. Myoview was done and was normal. Carotid dopplers were also  normal.   I spoke on the phone with Mr. Gallaga and his son. He still has some mild lightheadedness at times. Sometimes he will fall asleep on the couch and may be out for couple of hours.  The patient does not have symptoms concerning for COVID-19 infection (fever, chills, cough, or new shortness of breath).    Past Medical History:  Diagnosis Date  . Chronic kidney disease    mild insuffiency  . Coronary artery disease    LHC 4/10: Mid LAD 40-50%, then 70%, OM1 20-30%, EF 65%. Mid LAD FFR 0.89 (not hemodynamically significant). Medical therapy was continued.  . Hyperlipidemia   . Obstructive sleep apnea    Past Surgical History:  Procedure Laterality Date  . CARDIAC CATHETERIZATION  05/02/2003   single vessel,moderate stenosis mid LAD  . CARDIAC CATHETERIZATION  06/12/2008   continue med. therapy  . right shoulder surgery       Current Meds  Medication Sig  . amLODipine (NORVASC) 10 MG tablet Take 10 mg by mouth daily.  . Ascorbic Acid (VITAMIN C) 1000 MG tablet Take 1,000 mg by mouth daily.    Marland Kitchen aspirin EC 81 MG tablet Take 81 mg by mouth daily.  . calcium-vitamin D (OSCAL WITH D) 500-200 MG-UNIT per tablet Take 1 tablet by mouth 2 (two) times daily.  Marland Kitchen donepezil (ARICEPT) 10 MG tablet Take 1 tablet by mouth daily.  . fluticasone (FLONASE) 50 MCG/ACT nasal spray Place 2 sprays into both nostrils daily.  . furosemide (LASIX) 20 MG  tablet Take 20 mg by mouth daily. Take 20 mg daily if needed for swelling  . gabapentin (NEURONTIN) 100 MG capsule Take 100 mg by mouth at bedtime.  Marland Kitchen LISINOPRIL PO Take by mouth. Take 1 tablet daily by mouth.  . Multiple Vitamin (MULTIVITAMIN) tablet Take 1 tablet by mouth daily.    . Omega-3 Fatty Acids (FISH OIL) 600 MG CAPS Take 1 capsule by mouth 2 (two) times daily.   . Psyllium (METAMUCIL PO) Take 5 mLs by mouth daily.   . rosuvastatin (CRESTOR) 10 MG tablet Take 0.5 tablets (5 mg total) by mouth every other day. NEEDS APPOINTMENT FOR FUTURE  REFILLS  . Saw Palmetto 450 MG CAPS Take 1 capsule by mouth 2 (two) times daily.   . [DISCONTINUED] Theanine 100 MG CAPS Take 100 mg by mouth daily.      Allergies:   Darifenacin; Enablex [darifenacin hydrobromide er]; Ceftriaxone; Atorvastatin; Ezetimibe; Levaquin [levofloxacin]; Other; Oxybutynin; Sulfamethoxazole; Sulfasalazine; and Sulfonamide derivatives   Social History   Tobacco Use  . Smoking status: Former Smoker    Years: 40.00    Types: Pipe    Last attempt to quit: 02/28/1978    Years since quitting: 40.4  . Smokeless tobacco: Never Used  . Tobacco comment: smoked pipe only   Substance Use Topics  . Alcohol use: No    Alcohol/week: 0.0 standard drinks    Comment: rarely wine  . Drug use: No     Family Hx: The patient's family history includes Cancer in his mother; Hypertension in his father and mother; Other in his father.  ROS:   Please see the history of present illness.    All other systems reviewed and are negative.   Prior CV studies:   The following studies were reviewed today:  Myoview 07/20/18: Study Highlights    Nuclear stress EF: 63%. The left ventricular ejection fraction is normal (55-65%).  There was no ST segment deviation noted during stress.  The study is normal.  This is a low risk study.     Labs/Other Tests and Data Reviewed:    EKG:  No ECG reviewed.  Recent Labs: No results found for requested labs within last 8760 hours.   Recent Lipid Panel No results found for: CHOL, TRIG, HDL, CHOLHDL, LDLCALC, LDLDIRECT  Dated 07/01/16: cholesterol 140, triglycerides 123, HDL 38, LDL 77 Labs dated 07/16/18: Normal CMET, TSH. Hgb 12.9   Wt Readings from Last 3 Encounters:  07/30/18 190 lb (86.2 kg)  07/20/18 201 lb (91.2 kg)  07/18/18 201 lb (91.2 kg)     Objective:    Vital Signs:  BP 127/72   Pulse 73   Ht 5\' 10"  (1.778 m)   Wt 190 lb (86.2 kg)   BMI 27.26 kg/m    VITAL SIGNS:  reviewed  ASSESSMENT & PLAN:    1.  Dizziness: not orthostatic on prior evaluation.  Carotid dopplers negative. Suspect some delayed vascular reflex with his antihypertensive therapy. Will monitor  2. CAD: Most recent cardiac cath in 2010 revealed LAD disease 40%-50% then 70% treated medically. With new LBBB but Myoview is normal. No further work up needed.   3. Hypertension: BP is controlled currently per vitals in the PCP office, no changes in medications at this time.   4. Alzheimer's : He is on Aricept.    COVID-19 Education: The signs and symptoms of COVID-19 were discussed with the patient and how to seek care for testing (follow up with PCP or arrange E-visit).  The importance of social distancing was discussed today.  Time:   Today, I have spent 10 minutes with the patient with telehealth technology discussing the above problems.     Medication Adjustments/Labs and Tests Ordered: Current medicines are reviewed at length with the patient today.  Concerns regarding medicines are outlined above.   Tests Ordered: No orders of the defined types were placed in this encounter.   Medication Changes: No orders of the defined types were placed in this encounter.   Disposition:  Follow up prn  Signed, Cabell Lazenby Martinique, MD  07/30/2018 3:44 PM    [facility]

## 2018-07-27 NOTE — Telephone Encounter (Signed)
Mychart, smartphone (call son [name] [name] [phone]), consent, pre reg complete 07/27/18 AF

## 2018-07-30 ENCOUNTER — Telehealth: Payer: Self-pay | Admitting: Cardiology

## 2018-07-30 ENCOUNTER — Encounter: Payer: Self-pay | Admitting: Cardiology

## 2018-07-30 ENCOUNTER — Telehealth (INDEPENDENT_AMBULATORY_CARE_PROVIDER_SITE_OTHER): Payer: Medicare Other | Admitting: Cardiology

## 2018-07-30 VITALS — BP 127/72 | HR 73 | Ht 70.0 in | Wt 190.0 lb

## 2018-07-30 DIAGNOSIS — R42 Dizziness and giddiness: Secondary | ICD-10-CM

## 2018-07-30 DIAGNOSIS — I447 Left bundle-branch block, unspecified: Secondary | ICD-10-CM

## 2018-07-30 DIAGNOSIS — I2510 Atherosclerotic heart disease of native coronary artery without angina pectoris: Secondary | ICD-10-CM

## 2018-07-30 DIAGNOSIS — I10 Essential (primary) hypertension: Secondary | ICD-10-CM

## 2018-07-30 NOTE — Telephone Encounter (Signed)
New message    [name] [name] is calling and says her brother [name] [name] will be helping with the pts appt and to call  [phone] for the appt    Please call

## 2018-07-30 NOTE — Patient Instructions (Signed)
Medication Instructions:  Continue same medications   Lab work: None ordered   Testing/Procedures: None ordered  Follow-Up: At [facility], you and your health needs are our priority.  As part of our continuing mission to provide you with exceptional heart care, we have created designated Provider Care Teams.  These Care Teams include your primary Cardiologist (physician) and Advanced Practice Providers (APPs -  Physician Assistants and Nurse Practitioners) who all work together to provide you with the care you need, when you need it. . Follow up as needed

## 2018-07-30 NOTE — Telephone Encounter (Signed)
Information for video call noted in appt notes.

## 2018-08-13 ENCOUNTER — Other Ambulatory Visit: Payer: Self-pay

## 2018-08-13 ENCOUNTER — Ambulatory Visit (INDEPENDENT_AMBULATORY_CARE_PROVIDER_SITE_OTHER): Payer: Medicare Other | Admitting: Physician Assistant

## 2018-08-13 ENCOUNTER — Encounter: Payer: Self-pay | Admitting: Physician Assistant

## 2018-08-13 DIAGNOSIS — M1612 Unilateral primary osteoarthritis, left hip: Secondary | ICD-10-CM | POA: Diagnosis not present

## 2018-08-13 NOTE — Progress Notes (Addendum)
Office Visit Note   Patient: [name] [name]           Date of Birth: Jul 08, 1940           MRN: [id number] Visit Date: 08/13/2018              Requested by: Chesley Noon, MD [place],  [place] [id number] PCP: Chesley Noon, MD   Assessment & Plan: Visit Diagnoses:  1. Primary osteoarthritis of left hip     Plan: Discussed with the patient and his daughter who is present throughout examination today that he never has to have anything done with the hip. However if his quality of life affected he may consider left total hip arthroplasty.  I do not feel that the intra-articular injection would help much.  He can continue to use a cane in his right hand to offload his left hip.  If he ever decides to proceed with any type of surgical intervention, would recommend total hip arthroplasty.  The procedure would be a left total hip arthroplasty from the anterior approach performed by Dr. Ninfa Linden.  Hip replacement model was shown ,components shown to the patient and his daughter today.  Risk benefits of surgery discussed with patient and his daughter today.  He would need cardiac clearance and also clearance from his primary care physician before proceeding with surgery.  They will think about this and call us back and let us know what they like to do in regards to his left hip arthritis.  Questions were encouraged and answered at length  Follow-Up Instructions: Return if symptoms worsen or fail to improve.   Orders:  No orders of the defined types were placed in this encounter.  No orders of the defined types were placed in this encounter.     Procedures: No procedures performed   Clinical Data: No additional findings.   Subjective: Chief Complaint  Patient presents with  . Left Hip - Pain    HPI  [name] is 78 year old male who comes in today for second opinion of his left hip pain.  He was seen at [facility] last week and radiographs were  obtained of his hip.  He presents with his daughter today due to the fact he has some early Alzheimer's dementia.  States that he has had increasing pain in his left hip over the last several months.  Ambulates with a cane.  States he cannot get around this easily due to the hip pain.  Also has difficulty getting on his shoes and socks due to the pain in the hip.  No known injury.  Past medical history also pertinent for coronary artery disease, chronic kidney disease stage II, and sleep apnea. Radiographs are reviewed on a CD  Brought in by the patient. AP pelvis and lateral left hip show no acute fracture. Both hips well located and left hip to have bone on bone arthritis. Right hip well preserved.   Review of Systems Denies any fevers or chills.  Please see HPI.  Objective: Vital Signs: There were no vitals taken for this visit.  Physical Exam Constitutional:      Appearance: He is not ill-appearing or diaphoretic.  Pulmonary:     Effort: Pulmonary effort is normal.  Neurological:     Mental Status: He is alert and oriented to person, place, and time.  Psychiatric:        Mood and Affect: Mood normal.     Ortho  Exam Right hip good range of motion without pain.  Left hip no internal rotation.  Limited external rotation with discomfort.  Circumflexion of the right hip causes no pain left hip causes pain in the groin area.  Calf supple nontender. Specialty Comments:  No specialty comments available.  Imaging: No results found.   PMFS History: Patient Active Problem List   Diagnosis Date Noted  . Alzheimer's dementia ([place]) 12/07/2016  . 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  . 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  . Venous stasis dermatitis of left lower extremity 04/07/2016  . Bilateral impacted cerumen 01/18/2016  . 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  . Unsteadiness 01/18/2016  . 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  . Cellulitis of left lower leg    . Left leg swelling   . Cellulitis 08/23/2014  . Chronic congestive heart failure ([place]) 04/29/2014  . Chronic kidney disease, stage II (mild)   . Thrombocytopenia, unspecified ([place]) 07/29/2010  . Vitamin D deficiency 05/19/2010  . 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 Urinary urgency 05/19/2010  . 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 Benign essential hypertension 12/23/2009  . Actinic keratosis 12/23/2009  . Seborrheic keratosis 03/18/2009  . Benign neoplasm 03/18/2009  . Other and unspecified hyperlipidemia 01/18/2008  . Obstructive sleep apnea 01/18/2008  . Coronary atherosclerosis 01/18/2008  . Atherosclerotic heart disease of native coronary artery without angina pectoris 01/18/2008  . 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 Unspecified protein-calorie malnutrition ([place]) 09/27/2006  . Gastro-esophageal reflux disease with esophagitis 09/15/2006  . 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 CAD (coronary artery disease), native coronary artery 05/11/2005   Past Medical History:  Diagnosis Date  . Chronic kidney disease    mild insuffiency  . Coronary artery disease    LHC 4/10: Mid LAD 40-50%, then 70%, OM1 20-30%, EF 65%. Mid LAD FFR 0.89 (not hemodynamically significant). Medical therapy was continued.  . Hyperlipidemia   . Obstructive sleep apnea     Family History  Problem Relation Age of Onset  . Cancer Mother   . Hypertension Mother   . Other Father        old age  . Hypertension Father     Past Surgical History:  Procedure Laterality Date  . CARDIAC CATHETERIZATION  05/02/2003   single vessel,moderate stenosis mid LAD  . CARDIAC CATHETERIZATION  06/12/2008   continue med. therapy  . right shoulder surgery     Social History   Occupational History  . Occupation: [employer]  Tobacco Use  . Smoking status: Former Smoker    Years: 40.00    Types: Pipe    Quit date:  02/28/1978    Years since quitting: 40.4  . Smokeless tobacco: Never Used  . Tobacco comment: smoked pipe only   Substance and Sexual Activity  . Alcohol use: No    Alcohol/week: 0.0 standard drinks    Comment: rarely wine  . Drug use: No  . Sexual activity: Not on file

## 2018-08-17 ENCOUNTER — Telehealth: Payer: Self-pay

## 2018-08-17 NOTE — Telephone Encounter (Signed)
   [facility] Pre-operative Risk Assessment    Request for surgical clearance:  1. What type of surgery is being performed? Left total hip arthroplasty   2. When is this surgery scheduled? pending   3. What type of clearance is required (medical clearance vs. Pharmacy clearance to hold med vs. Both)? medical  4. Are there any medications that need to be held prior to surgery and how long? n/a   5. Practice name and name of physician performing surgery? [facility]  Dr Zollie Beckers   6. What is your office phone number? [phone]    7.   What is your office fax number? [name]  8.   Anesthesia type (None, local, MAC, general)? choice (general vs spinal)   [name] [name], [name] [name] 08/17/2018, 3:58 PM  _________________________________________________________________   (provider comments below)

## 2018-08-20 NOTE — Telephone Encounter (Signed)
   Primary Cardiologist: Peter Martinique, MD  Chart reviewed as part of pre-operative protocol coverage.  [name] [name] was last seen on 07/30/2018 by Dr. Martinique.  [name] [name] has a hx of CAD, last cath 2010. He recently had new LBBB on EKG. Follow up myoview on 07/20/2018 was normal, very low risk.   Therefore, based on ACC/AHA guidelines, the patient would be at acceptable risk for the planned procedure without further cardiovascular testing.   I will route this recommendation to the requesting party via Epic fax function and remove from pre-op pool.  Please call with questions.  Daune Perch, NP 08/20/2018, 5:07 PM

## 2018-08-27 ENCOUNTER — Other Ambulatory Visit: Payer: Self-pay

## 2018-09-05 ENCOUNTER — Other Ambulatory Visit: Payer: Self-pay | Admitting: Physician Assistant

## 2018-09-06 ENCOUNTER — Encounter: Payer: Self-pay | Admitting: Orthopaedic Surgery

## 2018-09-06 ENCOUNTER — Ambulatory Visit (INDEPENDENT_AMBULATORY_CARE_PROVIDER_SITE_OTHER): Payer: Medicare Other | Admitting: Orthopaedic Surgery

## 2018-09-06 ENCOUNTER — Other Ambulatory Visit: Payer: Self-pay

## 2018-09-06 DIAGNOSIS — M1612 Unilateral primary osteoarthritis, left hip: Secondary | ICD-10-CM

## 2018-09-06 NOTE — Progress Notes (Signed)
HPI: [name] [name] comes in today just to discuss his upcoming left total hip surgery which is scheduled for 09/14/2018.  Also to meet Dr. Ninfa Linden.  He is accompanied by his daughter today who also has several questions. They have concerns due to his urinary frequency and urgency and requesting the Foley catheter be left in for an extended time postoperative.  He is seen at [facility] and definitely with he having his surgery at [facility] long consult him if there is any urological concerns during his admission.  In regards to his sleep apnea is wondering if he should have a CPAP machine.  Impression: Primary osteoarthritis left hip  Plan: Questions were encouraged and answered at length by Dr. Ninfa Linden and myself.  We will see him back 2 weeks postop.  His med list was updated today.

## 2018-09-10 NOTE — Progress Notes (Signed)
CARDIAC CLEARANCE JANINE HAMMON NP 08-20-18 Epic LOV DR Melford Aase FAMILY MD 08-23-18 Epic STRESS TEST WITH RESTING EKG 07-20-18 EPIC

## 2018-09-10 NOTE — Patient Instructions (Addendum)
YOU NEED TO HAVE A COVID 19 TEST ON    09-11-2018 AT 10:00 AM.  THIS TEST MUST BE DONE BEFORE SURGERY, COME TO [place] ENTRANCE. ONCE YOUR COVID TEST IS COMPLETED, PLEASE BEGIN THE QUARANTINE INSTRUCTIONS AS OUTLINED IN YOUR HANDOUT.                [name] [name]    Your procedure is scheduled on: 09-14-2018   Report to [facility] Main  Entrance    Report to Admitting at 7:15  AM     Call this number if you have problems the morning of surgery [phone]    Remember:   NO SOLID FOOD AFTER MIDNIGHT THE NIGHT PRIOR TO SURGERY. NOTHING BY MOUTH EXCEPT CLEAR LIQUIDS UNTIL 6:45 AM. PLEASE FINISH ENSURE DRINK PER SURGEON ORDER 3 HOURS PRIOR TO SCHEDULED SURGERY TIME WHICH NEEDS TO BE COMPLETED AT 6:45 AM.   CLEAR LIQUID DIET   Foods Allowed                                                                     Foods Excluded  Coffee and tea, regular and decaf                             liquids that you cannot  Plain Jell-O in any flavor                                             see through such as: Fruit ices (not with fruit pulp)                                     milk, soups, orange juice  Iced Popsicles                                    All solid food Carbonated beverages, regular and diet                                    Cranberry, grape and apple juices Sports drinks like Gatorade Lightly seasoned clear broth or consume(fat free) Sugar, honey syrup  Sample Menu Breakfast                                Lunch                                     Supper Cranberry juice                    Beef broth                            Chicken  broth Jell-O                                     Grape juice                           Apple juice Coffee or tea                        Jell-O                                      Popsicle                                                Coffee or tea                        Coffee or  tea  _____________________________________________________________________     Take these medicines the morning of surgery with A SIP OF WATER: None. You may also bring and use your nasal spray as needed.        BRUSH YOUR TEETH MORNING OF SURGERY AND RINSE YOUR MOUTH OUT, NO CHEWING GUM CANDY OR MINTS.                          You may not have any metal on your body including hair pins and              piercings     Do not wear jewelry, cologne  lotions, powders or deodorant                       Men may shave face and neck.   Do not bring valuables to the hospital. [place].  Contacts, dentures or bridgework may not be worn into surgery.                  Please read over the following fact sheets you were given: _____________________________________________________________________             [facility] - Preparing for Surgery Before surgery, you can play an important role.  Because skin is not sterile, your skin needs to be as free of germs as possible.  You can reduce the number of germs on your skin by washing with CHG (chlorahexidine gluconate) soap before surgery.  CHG is an antiseptic cleaner which kills germs and bonds with the skin to continue killing germs even after washing. Please DO NOT use if you have an allergy to CHG or antibacterial soaps.  If your skin becomes reddened/irritated stop using the CHG and inform your nurse when you arrive at Short Stay. Do not shave (including legs and underarms) for at least 48 hours prior to the first CHG shower.  You may shave your face/neck. Please follow these instructions carefully:  1.  Shower with CHG Soap the night before surgery and the  morning of Surgery.  2.  If you choose to wash your  hair, wash your hair first as usual with your  normal  shampoo.  3.  After you shampoo, rinse your hair and body thoroughly to remove the  shampoo.                           4.  Use CHG as  you would any other liquid soap.  You can apply chg directly  to the skin and wash                       Gently with a scrungie or clean washcloth.  5.  Apply the CHG Soap to your body ONLY FROM THE NECK DOWN.   Do not use on face/ open                           Wound or open sores. Avoid contact with eyes, ears mouth and genitals (private parts).                       Wash face,  Genitals (private parts) with your normal soap.             6.  Wash thoroughly, paying special attention to the area where your surgery  will be performed.  7.  Thoroughly rinse your body with warm water from the neck down.  8.  DO NOT shower/wash with your normal soap after using and rinsing off  the CHG Soap.                9.  Pat yourself dry with a clean towel.            10.  Wear clean pajamas.            11.  Place clean sheets on your bed the night of your first shower and do not  sleep with pets. Day of Surgery : Do not apply any lotions/deodorants the morning of surgery.  Please wear clean clothes to the hospital/surgery center.  FAILURE TO FOLLOW THESE INSTRUCTIONS MAY RESULT IN THE CANCELLATION OF YOUR SURGERY PATIENT SIGNATURE_________________________________  NURSE SIGNATURE__________________________________  ________________________________________________________________________   [name] [name]  An incentive spirometer is a tool that can help keep your lungs clear and active. This tool measures how well you are filling your lungs with each breath. Taking long deep breaths may help reverse or decrease the chance of developing breathing (pulmonary) problems (especially infection) following:  A long period of time when you are unable to move or be active. BEFORE THE PROCEDURE   If the spirometer includes an indicator to show your best effort, your nurse or respiratory therapist will set it to a desired goal.  If possible, sit up straight or lean slightly forward. Try not to  slouch.  Hold the incentive spirometer in an upright position. INSTRUCTIONS FOR USE  1. Sit on the edge of your bed if possible, or sit up as far as you can in bed or on a chair. 2. Hold the incentive spirometer in an upright position. 3. Breathe out normally. 4. Place the mouthpiece in your mouth and seal your lips tightly around it. 5. Breathe in slowly and as deeply as possible, raising the piston or the ball toward the top of the column. 6. Hold your breath for 3-5 seconds or for as long as possible. Allow the piston or ball to fall to the bottom of  the column. 7. Remove the mouthpiece from your mouth and breathe out normally. 8. Rest for a few seconds and repeat Steps 1 through 7 at least 10 times every 1-2 hours when you are awake. Take your time and take a few normal breaths between deep breaths. 9. The spirometer may include an indicator to show your best effort. Use the indicator as a goal to work toward during each repetition. 10. After each set of 10 deep breaths, practice coughing to be sure your lungs are clear. If you have an incision (the cut made at the time of surgery), support your incision when coughing by placing a pillow or rolled up towels firmly against it. Once you are able to get out of bed, walk around indoors and cough well. You may stop using the incentive spirometer when instructed by your caregiver.  RISKS AND COMPLICATIONS  Take your time so you do not get dizzy or light-headed.  If you are in pain, you may need to take or ask for pain medication before doing incentive spirometry. It is harder to take a deep breath if you are having pain. AFTER USE  Rest and breathe slowly and easily.  It can be helpful to keep track of a log of your progress. Your caregiver can provide you with a simple table to help with this. If you are using the spirometer at home, follow these instructions: [place] IF:   You are having difficultly using the spirometer.  You  have trouble using the spirometer as often as instructed.  Your pain medication is not giving enough relief while using the spirometer.  You develop fever of 100.5 F (38.1 C) or higher. SEEK IMMEDIATE MEDICAL CARE IF:   You cough up bloody sputum that had not been present before.  You develop fever of 102 F (38.9 C) or greater.  You develop worsening pain at or near the incision site. MAKE SURE YOU:   Understand these instructions.  Will watch your condition.  Will get help right away if you are not doing well or get worse. Document Released: 06/27/2006 Document Revised: 05/09/2011 Document Reviewed: 08/28/2006 [facility] Patient Information 2014 [place], [place].   ________________________________________________________________________

## 2018-09-11 ENCOUNTER — Encounter (HOSPITAL_COMMUNITY)
Admission: RE | Admit: 2018-09-11 | Discharge: 2018-09-11 | Disposition: A | Discharge status: Home / Self Care | Payer: Medicare Other | Source: Ambulatory Visit | Attending: Orthopaedic Surgery | Admitting: Orthopaedic Surgery

## 2018-09-11 ENCOUNTER — Encounter (HOSPITAL_COMMUNITY): Payer: Self-pay

## 2018-09-11 ENCOUNTER — Other Ambulatory Visit: Payer: Self-pay

## 2018-09-11 ENCOUNTER — Other Ambulatory Visit (HOSPITAL_COMMUNITY)
Admission: RE | Admit: 2018-09-11 | Discharge: 2018-09-11 | Disposition: A | Discharge status: Home / Self Care | Payer: Medicare Other | Source: Ambulatory Visit | Attending: Orthopaedic Surgery | Admitting: Orthopaedic Surgery

## 2018-09-11 DIAGNOSIS — M1612 Unilateral primary osteoarthritis, left hip: Secondary | ICD-10-CM | POA: Diagnosis not present

## 2018-09-11 DIAGNOSIS — Z01812 Encounter for preprocedural laboratory examination: Secondary | ICD-10-CM | POA: Diagnosis present

## 2018-09-11 DIAGNOSIS — Z1159 Encounter for screening for other viral diseases: Secondary | ICD-10-CM | POA: Diagnosis not present

## 2018-09-11 HISTORY — DX: Heart failure, unspecified: I50.9

## 2018-09-11 HISTORY — DX: Dementia in other diseases classified elsewhere, unspecified severity, without behavioral disturbance, psychotic disturbance, mood disturbance, and anxiety: F02.80

## 2018-09-11 LAB — BASIC METABOLIC PANEL
Anion gap: 10 (ref 5–15)
BUN: 19 mg/dL (ref 8–23)
CO2: 27 mmol/L (ref 22–32)
Calcium: 10.1 mg/dL (ref 8.9–10.3)
Chloride: 104 mmol/L (ref 98–111)
Creatinine, Ser: 1.07 mg/dL (ref 0.61–1.24)
GFR calc Af Amer: >60 mL/min (ref >60)
GFR calc non Af Amer: >60 mL/min (ref >60)
Glucose, Bld: 107 mg/dL — ABNORMAL HIGH (ref 70–99)
Potassium: 3.8 mmol/L (ref 3.5–5.1)
Sodium: 141 mmol/L (ref 135–145)

## 2018-09-11 LAB — SURGICAL PCR SCREEN
MRSA, PCR: NEGATIVE
Staphylococcus aureus: NEGATIVE

## 2018-09-11 LAB — CBC
HCT: 46.8 % (ref 39.0–52.0)
Hemoglobin: 15.4 g/dL (ref 13.0–17.0)
MCH: 31.6 pg (ref 26.0–34.0)
MCHC: 32.9 g/dL (ref 30.0–36.0)
MCV: 95.9 fL (ref 80.0–100.0)
Platelets: 219 10*3/uL (ref 150–400)
RBC: 4.88 MIL/uL (ref 4.22–5.81)
RDW: 12.6 % (ref 11.5–15.5)
WBC: 6.5 10*3/uL (ref 4.0–10.5)
nRBC: 0 % (ref 0.0–0.2)

## 2018-09-11 LAB — SARS CORONAVIRUS 2 (TAT 6-24 HRS): SARS Coronavirus 2: NEGATIVE

## 2018-09-11 NOTE — Progress Notes (Signed)
07-16-18  (Epic) EKG on chart

## 2018-09-12 ENCOUNTER — Encounter (HOSPITAL_COMMUNITY): Payer: Self-pay

## 2018-09-12 NOTE — Progress Notes (Signed)
Anesthesia Chart Review:   Case: [id number] Date/Time: 09/14/18 0930   Procedure: LEFT TOTAL HIP ARTHROPLASTY ANTERIOR APPROACH (Left )   Anesthesia type: Choice   Pre-op diagnosis: LEFT HIP OSTEOARTHRITIS   Location: [place] 09 / [facility] ORS   Surgeon: Mcarthur Rossetti, MD      DISCUSSION:  Pt is an 78 year old male with hx CAD (mild to moderate disease, managed medically), LBBB (new as of spring 2020; low risk stress test 07/20/18), combined systolic and diastolic CHF, HTN, OSA, CKD (mild insufficiency), Alzheimer's disease   VS: BP (!) 143/72   Pulse 82   Temp 37 C (Oral)   Resp 18   Ht 5\' 9"  (1.753 m)   Wt 91.6 kg   SpO2 98%   BMI 29.82 kg/m    PROVIDERS:  - PCP is Chesley Noon, MD - Cardiologist is Peter Martinique, MD. Last office visit 07/20/18 with Jory Sims, DNP - Neurology care by Queen Slough, NP (notes in care everywhere)   LABS: Labs reviewed: Acceptable for surgery. (all labs ordered are listed, but only abnormal results are displayed)  Labs Reviewed  BASIC METABOLIC PANEL - Abnormal; Notable for the following components:      Result Value   Glucose, Bld 107 (*)    All other components within normal limits  SURGICAL PCR SCREEN  CBC    EKG 07/20/18 (baseline tracing from stress test): NSR. LBBB.    CV:  Nuclear stress test 07/20/18:   Nuclear stress EF: 63%. The left ventricular ejection fraction is normal (55-65%).  There was no ST segment deviation noted during stress.  The study is normal.  This is a low risk study.  Cardiac cath 06/12/08:  - LM: short and normal - LAD: mild to moderate calcification. Mid LAD 40-50%; within this segment, there is a more focal 70% eccentric stenosis. D1: no significant disease - CX: Large, dominant. OM1: 20-30%. Otherwise, CX appears normal - RCA: small nondominant vessel and appears normal.     Past Medical History:  Diagnosis Date  . Alzheimer disease ([place])   . CHF (congestive heart failure)  ([place])   . Chronic kidney disease    mild insuffiency  . Coronary artery disease    LHC 4/10: Mid LAD 40-50%, then 70%, OM1 20-30%, EF 65%. Mid LAD FFR 0.89 (not hemodynamically significant). Medical therapy was continued.  . Hyperlipidemia   . LBBB (left bundle branch block) 06/2018  . Obstructive sleep apnea     Past Surgical History:  Procedure Laterality Date  . CARDIAC CATHETERIZATION  05/02/2003   single vessel,moderate stenosis mid LAD  . CARDIAC CATHETERIZATION  06/12/2008   continue med. therapy  . right shoulder surgery      MEDICATIONS: . Ascorbic Acid (VITAMIN C) 1000 MG tablet  . aspirin EC 81 MG tablet  . calcium-vitamin D (OSCAL WITH D) 500-200 MG-UNIT per tablet  . donepezil (ARICEPT) 10 MG tablet  . fluticasone (FLONASE) 50 MCG/ACT nasal spray  . furosemide (LASIX) 20 MG tablet  . gabapentin (NEURONTIN) 100 MG capsule  . lisinopril (ZESTRIL) 20 MG tablet  . mirabegron ER (MYRBETRIQ) 25 MG TB24 tablet  . Multiple Vitamin (MULTIVITAMIN) tablet  . Omega-3 Fatty Acids (FISH OIL) 600 MG CAPS  . Psyllium (METAMUCIL PO)  . rosuvastatin (CRESTOR) 10 MG tablet  . Saw Palmetto 450 MG CAPS   No current facility-administered medications for this encounter.     If no changes, I anticipate pt can proceed with surgery  as scheduled.   Willeen Cass, FNP-BC [facility] Short Stay Surgical Center/Anesthesiology Phone: [phone] 09/12/2018 10:54 AM

## 2018-09-13 NOTE — Anesthesia Preprocedure Evaluation (Addendum)
Anesthesia Evaluation  Patient identified by MRN, date of birth, ID band Patient awake    Reviewed: Allergy & Precautions, NPO status , Patient's Chart, lab work & pertinent test results  History of Anesthesia Complications Negative for: history of anesthetic complications  Airway Mallampati: IV  TM Distance: >3 FB Neck ROM: Full  Mouth opening: Limited Mouth Opening  Dental no notable dental hx.    Pulmonary sleep apnea , former smoker,    Pulmonary exam normal        Cardiovascular hypertension, Pt. on medications + CAD  Normal cardiovascular exam  Nuclear stress 07/20/18: EF 63%, low risk study  LBBB   Neuro/Psych Depression Dementia negative neurological ROS     GI/Hepatic negative GI ROS, Neg liver ROS,   Endo/Other  negative endocrine ROS  Renal/GU Renal InsufficiencyRenal disease     Musculoskeletal  (+) Arthritis ,   Abdominal   Peds  Hematology negative hematology ROS (+)   Anesthesia Other Findings Day of surgery medications reviewed with the patient.  Reproductive/Obstetrics                            Anesthesia Physical Anesthesia Plan  ASA: III  Anesthesia Plan: Spinal   Post-op Pain Management:    Induction:   PONV Risk Score and Plan: 2 and Treatment may vary due to age or medical condition, Ondansetron, Propofol infusion and Dexamethasone  Airway Management Planned: Natural Airway and Simple Face Mask  Additional Equipment:   Intra-op Plan:   Post-operative Plan:   Informed Consent: I have reviewed the patients History and Physical, chart, labs and discussed the procedure including the risks, benefits and alternatives for the proposed anesthesia with the patient or authorized representative who has indicated his/her understanding and acceptance.     Dental advisory given  Plan Discussed with: CRNA  Anesthesia Plan Comments:        Anesthesia  Quick Evaluation

## 2018-09-14 ENCOUNTER — Inpatient Hospital Stay (HOSPITAL_COMMUNITY)
Admission: RE | Admit: 2018-09-14 | Discharge: 2018-09-17 | DRG: 470 | Disposition: A | Discharge status: Home Health | Payer: Medicare Other | Source: Other Acute Inpatient Hospital | Attending: Orthopaedic Surgery | Admitting: Orthopaedic Surgery

## 2018-09-14 ENCOUNTER — Encounter (HOSPITAL_COMMUNITY): Payer: Self-pay | Admitting: *Deleted

## 2018-09-14 ENCOUNTER — Other Ambulatory Visit: Payer: Self-pay

## 2018-09-14 ENCOUNTER — Ambulatory Visit (HOSPITAL_COMMUNITY): Payer: Medicare Other

## 2018-09-14 ENCOUNTER — Encounter (HOSPITAL_COMMUNITY)
Admission: RE | Disposition: A | Discharge status: Home Health | Payer: Self-pay | Source: Other Acute Inpatient Hospital | Attending: Orthopaedic Surgery

## 2018-09-14 ENCOUNTER — Observation Stay (HOSPITAL_COMMUNITY): Payer: Medicare Other

## 2018-09-14 ENCOUNTER — Ambulatory Visit (HOSPITAL_COMMUNITY): Payer: Medicare Other | Admitting: Anesthesiology

## 2018-09-14 ENCOUNTER — Ambulatory Visit (HOSPITAL_COMMUNITY): Payer: Medicare Other | Admitting: Emergency Medicine

## 2018-09-14 DIAGNOSIS — I130 Hypertensive heart and chronic kidney disease with heart failure and stage 1 through stage 4 chronic kidney disease, or unspecified chronic kidney disease: Secondary | ICD-10-CM | POA: Diagnosis present

## 2018-09-14 DIAGNOSIS — E669 Obesity, unspecified: Secondary | ICD-10-CM | POA: Diagnosis present

## 2018-09-14 DIAGNOSIS — Z96642 Presence of left artificial hip joint: Secondary | ICD-10-CM

## 2018-09-14 DIAGNOSIS — N182 Chronic kidney disease, stage 2 (mild): Secondary | ICD-10-CM | POA: Diagnosis present

## 2018-09-14 DIAGNOSIS — Z7982 Long term (current) use of aspirin: Secondary | ICD-10-CM

## 2018-09-14 DIAGNOSIS — K210 Gastro-esophageal reflux disease with esophagitis: Secondary | ICD-10-CM | POA: Diagnosis present

## 2018-09-14 DIAGNOSIS — M1612 Unilateral primary osteoarthritis, left hip: Secondary | ICD-10-CM | POA: Diagnosis not present

## 2018-09-14 DIAGNOSIS — G4733 Obstructive sleep apnea (adult) (pediatric): Secondary | ICD-10-CM | POA: Diagnosis present

## 2018-09-14 DIAGNOSIS — Z809 Family history of malignant neoplasm, unspecified: Secondary | ICD-10-CM

## 2018-09-14 DIAGNOSIS — Z79899 Other long term (current) drug therapy: Secondary | ICD-10-CM

## 2018-09-14 DIAGNOSIS — Z6829 Body mass index (BMI) 29.0-29.9, adult: Secondary | ICD-10-CM

## 2018-09-14 DIAGNOSIS — Z419 Encounter for procedure for purposes other than remedying health state, unspecified: Secondary | ICD-10-CM

## 2018-09-14 DIAGNOSIS — H903 Sensorineural hearing loss, bilateral: Secondary | ICD-10-CM | POA: Diagnosis present

## 2018-09-14 DIAGNOSIS — Z7951 Long term (current) use of inhaled steroids: Secondary | ICD-10-CM

## 2018-09-14 DIAGNOSIS — G309 Alzheimer's disease, unspecified: Secondary | ICD-10-CM | POA: Diagnosis present

## 2018-09-14 DIAGNOSIS — Z882 Allergy status to sulfonamides status: Secondary | ICD-10-CM

## 2018-09-14 DIAGNOSIS — Z8249 Family history of ischemic heart disease and other diseases of the circulatory system: Secondary | ICD-10-CM

## 2018-09-14 DIAGNOSIS — I509 Heart failure, unspecified: Secondary | ICD-10-CM | POA: Diagnosis present

## 2018-09-14 DIAGNOSIS — I447 Left bundle-branch block, unspecified: Secondary | ICD-10-CM | POA: Diagnosis present

## 2018-09-14 DIAGNOSIS — Z881 Allergy status to other antibiotic agents status: Secondary | ICD-10-CM

## 2018-09-14 DIAGNOSIS — Z87891 Personal history of nicotine dependence: Secondary | ICD-10-CM

## 2018-09-14 DIAGNOSIS — E785 Hyperlipidemia, unspecified: Secondary | ICD-10-CM | POA: Diagnosis present

## 2018-09-14 DIAGNOSIS — F0280 Dementia in other diseases classified elsewhere without behavioral disturbance: Secondary | ICD-10-CM | POA: Diagnosis present

## 2018-09-14 DIAGNOSIS — Z888 Allergy status to other drugs, medicaments and biological substances status: Secondary | ICD-10-CM

## 2018-09-14 DIAGNOSIS — I2510 Atherosclerotic heart disease of native coronary artery without angina pectoris: Secondary | ICD-10-CM | POA: Diagnosis present

## 2018-09-14 HISTORY — PX: TOTAL HIP ARTHROPLASTY: SHX124

## 2018-09-14 SURGERY — ARTHROPLASTY, HIP, TOTAL, ANTERIOR APPROACH
Anesthesia: Spinal | Laterality: Left

## 2018-09-14 MED ORDER — METOCLOPRAMIDE HCL 5 MG/ML IJ SOLN: 5.0000 mg | Freq: Three times a day (TID) | INTRAMUSCULAR | Status: DC | PRN

## 2018-09-14 MED ORDER — METHOCARBAMOL 500 MG IVPB - SIMPLE MED
500.0000 mg | Freq: Four times a day (QID) | INTRAVENOUS | Status: DC | PRN
  Filled 2018-09-14: qty 50

## 2018-09-14 MED ORDER — PROPOFOL 10 MG/ML IV BOLUS
INTRAVENOUS | Status: AC
  Filled 2018-09-14: qty 40

## 2018-09-14 MED ORDER — ONDANSETRON HCL 4 MG/2ML IJ SOLN: 4.0000 mg | Freq: Four times a day (QID) | INTRAMUSCULAR | Status: DC | PRN

## 2018-09-14 MED ORDER — PHENYLEPHRINE 40 MCG/ML (10ML) SYRINGE FOR IV PUSH (FOR BLOOD PRESSURE SUPPORT)
PREFILLED_SYRINGE | INTRAVENOUS | Status: AC
  Filled 2018-09-14: qty 10

## 2018-09-14 MED ORDER — POVIDONE-IODINE 10 % EX SWAB: 2.0000 "application " | Freq: Once | CUTANEOUS | Status: DC

## 2018-09-14 MED ORDER — MENTHOL 3 MG MT LOZG: 1.0000 | LOZENGE | OROMUCOSAL | Status: DC | PRN

## 2018-09-14 MED ORDER — PHENOL 1.4 % MT LIQD: 1.0000 | OROMUCOSAL | Status: DC | PRN

## 2018-09-14 MED ORDER — ADULT MULTIVITAMIN W/MINERALS CH
1.0000 | ORAL_TABLET | Freq: Every day | ORAL | Status: DC
  Administered 2018-09-15 – 2018-09-17 (×3): 1 via ORAL
  Filled 2018-09-14 (×3): qty 1

## 2018-09-14 MED ORDER — ACETAMINOPHEN 325 MG PO TABS
325.0000 mg | ORAL_TABLET | Freq: Four times a day (QID) | ORAL | Status: DC | PRN
  Administered 2018-09-15 – 2018-09-16 (×2): 650 mg via ORAL
  Filled 2018-09-14 (×2): qty 2

## 2018-09-14 MED ORDER — EPHEDRINE 5 MG/ML INJ
INTRAVENOUS | Status: AC
  Filled 2018-09-14: qty 10

## 2018-09-14 MED ORDER — CLINDAMYCIN PHOSPHATE 900 MG/50ML IV SOLN
900.0000 mg | INTRAVENOUS | Status: AC
  Administered 2018-09-14: 10:00:00 900 mg via INTRAVENOUS
  Filled 2018-09-14: qty 50

## 2018-09-14 MED ORDER — METOCLOPRAMIDE HCL 5 MG PO TABS: 5.0000 mg | ORAL_TABLET | Freq: Three times a day (TID) | ORAL | Status: DC | PRN

## 2018-09-14 MED ORDER — HYDROCODONE-ACETAMINOPHEN 5-325 MG PO TABS
1.0000 | ORAL_TABLET | ORAL | Status: DC | PRN
  Administered 2018-09-14 – 2018-09-16 (×3): 1 via ORAL
  Filled 2018-09-14 (×3): qty 1

## 2018-09-14 MED ORDER — LIDOCAINE 2% (20 MG/ML) 5 ML SYRINGE
INTRAMUSCULAR | Status: DC | PRN
  Administered 2018-09-14: 50 mg via INTRAVENOUS

## 2018-09-14 MED ORDER — OXYCODONE HCL 5 MG PO TABS: 5.0000 mg | ORAL_TABLET | Freq: Once | ORAL | Status: DC | PRN

## 2018-09-14 MED ORDER — DIPHENHYDRAMINE HCL 12.5 MG/5ML PO ELIX: 12.5000 mg | ORAL_SOLUTION | ORAL | Status: DC | PRN

## 2018-09-14 MED ORDER — PROMETHAZINE HCL 25 MG/ML IJ SOLN: 6.2500 mg | INTRAMUSCULAR | Status: DC | PRN

## 2018-09-14 MED ORDER — POLYETHYLENE GLYCOL 3350 17 G PO PACK
17.0000 g | PACK | Freq: Every day | ORAL | Status: DC | PRN
  Administered 2018-09-16: 17 g via ORAL
  Filled 2018-09-14: qty 1

## 2018-09-14 MED ORDER — DOCUSATE SODIUM 100 MG PO CAPS
100.0000 mg | ORAL_CAPSULE | Freq: Two times a day (BID) | ORAL | Status: DC
  Administered 2018-09-14 – 2018-09-17 (×6): 100 mg via ORAL
  Filled 2018-09-14 (×6): qty 1

## 2018-09-14 MED ORDER — FLUTICASONE PROPIONATE 50 MCG/ACT NA SUSP
2.0000 | Freq: Every day | NASAL | Status: DC
  Administered 2018-09-15 – 2018-09-16 (×2): 2 via NASAL
  Filled 2018-09-14: qty 16

## 2018-09-14 MED ORDER — FENTANYL CITRATE (PF) 100 MCG/2ML IJ SOLN: 25.0000 ug | INTRAMUSCULAR | Status: DC | PRN

## 2018-09-14 MED ORDER — ONDANSETRON HCL 4 MG/2ML IJ SOLN
INTRAMUSCULAR | Status: AC
  Filled 2018-09-14: qty 2

## 2018-09-14 MED ORDER — BUPIVACAINE IN DEXTROSE 0.75-8.25 % IT SOLN
INTRATHECAL | Status: DC | PRN
  Administered 2018-09-14: 1.6 mL via INTRATHECAL

## 2018-09-14 MED ORDER — LIDOCAINE 2% (20 MG/ML) 5 ML SYRINGE
INTRAMUSCULAR | Status: AC
  Filled 2018-09-14: qty 5

## 2018-09-14 MED ORDER — MORPHINE SULFATE (PF) 2 MG/ML IV SOLN: 0.5000 mg | INTRAVENOUS | Status: DC | PRN

## 2018-09-14 MED ORDER — MIRABEGRON ER 25 MG PO TB24
25.0000 mg | ORAL_TABLET | Freq: Every day | ORAL | Status: DC
  Administered 2018-09-14 – 2018-09-17 (×4): 25 mg via ORAL
  Filled 2018-09-14 (×5): qty 1

## 2018-09-14 MED ORDER — FUROSEMIDE 20 MG PO TABS
20.0000 mg | ORAL_TABLET | ORAL | Status: DC
  Administered 2018-09-15: 20 mg via ORAL
  Filled 2018-09-14: qty 1

## 2018-09-14 MED ORDER — FENTANYL CITRATE (PF) 100 MCG/2ML IJ SOLN
INTRAMUSCULAR | Status: AC
  Filled 2018-09-14: qty 2

## 2018-09-14 MED ORDER — PHENYLEPHRINE 40 MCG/ML (10ML) SYRINGE FOR IV PUSH (FOR BLOOD PRESSURE SUPPORT)
PREFILLED_SYRINGE | INTRAVENOUS | Status: DC | PRN
  Administered 2018-09-14: 40 ug via INTRAVENOUS
  Administered 2018-09-14 (×3): 80 ug via INTRAVENOUS

## 2018-09-14 MED ORDER — METHOCARBAMOL 500 MG PO TABS
500.0000 mg | ORAL_TABLET | Freq: Four times a day (QID) | ORAL | Status: DC | PRN
  Administered 2018-09-14 – 2018-09-15 (×3): 500 mg via ORAL
  Filled 2018-09-14 (×3): qty 1

## 2018-09-14 MED ORDER — CLINDAMYCIN PHOSPHATE 600 MG/50ML IV SOLN
600.0000 mg | Freq: Four times a day (QID) | INTRAVENOUS | Status: AC
  Administered 2018-09-14 (×2): 600 mg via INTRAVENOUS
  Filled 2018-09-14 (×3): qty 50

## 2018-09-14 MED ORDER — SODIUM CHLORIDE 0.9 % IR SOLN
Status: DC | PRN
  Administered 2018-09-14: 1000 mL

## 2018-09-14 MED ORDER — OXYCODONE HCL 5 MG/5ML PO SOLN: 5.0000 mg | Freq: Once | ORAL | Status: DC | PRN

## 2018-09-14 MED ORDER — DEXAMETHASONE SODIUM PHOSPHATE 10 MG/ML IJ SOLN
INTRAMUSCULAR | Status: DC | PRN
  Administered 2018-09-14: 10 mg via INTRAVENOUS

## 2018-09-14 MED ORDER — ALUM & MAG HYDROXIDE-SIMETH 200-200-20 MG/5ML PO SUSP: 30.0000 mL | ORAL | Status: DC | PRN

## 2018-09-14 MED ORDER — SODIUM CHLORIDE 0.9 % IR SOLN
Status: DC | PRN
  Administered 2018-09-14: 1

## 2018-09-14 MED ORDER — ACETAMINOPHEN 10 MG/ML IV SOLN: 1000.0000 mg | Freq: Once | INTRAVENOUS | Status: DC | PRN

## 2018-09-14 MED ORDER — ASPIRIN 81 MG PO CHEW
81.0000 mg | CHEWABLE_TABLET | Freq: Two times a day (BID) | ORAL | Status: DC
  Administered 2018-09-14 – 2018-09-17 (×6): 81 mg via ORAL
  Filled 2018-09-14 (×6): qty 1

## 2018-09-14 MED ORDER — EPHEDRINE SULFATE-NACL 50-0.9 MG/10ML-% IV SOSY
PREFILLED_SYRINGE | INTRAVENOUS | Status: DC | PRN
  Administered 2018-09-14: 10 mg via INTRAVENOUS
  Administered 2018-09-14 (×2): 5 mg via INTRAVENOUS

## 2018-09-14 MED ORDER — PANTOPRAZOLE SODIUM 40 MG PO TBEC
40.0000 mg | DELAYED_RELEASE_TABLET | Freq: Every day | ORAL | Status: DC
  Administered 2018-09-14 – 2018-09-17 (×4): 40 mg via ORAL
  Filled 2018-09-14 (×4): qty 1

## 2018-09-14 MED ORDER — CHLORHEXIDINE GLUCONATE 4 % EX LIQD: 60.0000 mL | Freq: Once | CUTANEOUS | Status: DC

## 2018-09-14 MED ORDER — HYDROCODONE-ACETAMINOPHEN 7.5-325 MG PO TABS: 1.0000 | ORAL_TABLET | ORAL | Status: DC | PRN

## 2018-09-14 MED ORDER — SODIUM CHLORIDE 0.9 % IV SOLN
INTRAVENOUS | Status: DC
  Administered 2018-09-14: 13:00:00 via INTRAVENOUS

## 2018-09-14 MED ORDER — DONEPEZIL HCL 10 MG PO TABS
10.0000 mg | ORAL_TABLET | Freq: Every day | ORAL | Status: DC
  Administered 2018-09-14 – 2018-09-17 (×4): 10 mg via ORAL
  Filled 2018-09-14 (×4): qty 1

## 2018-09-14 MED ORDER — ONDANSETRON HCL 4 MG/2ML IJ SOLN
INTRAMUSCULAR | Status: DC | PRN
  Administered 2018-09-14: 4 mg via INTRAVENOUS

## 2018-09-14 MED ORDER — FENTANYL CITRATE (PF) 100 MCG/2ML IJ SOLN
INTRAMUSCULAR | Status: DC | PRN
  Administered 2018-09-14: 100 ug via INTRAVENOUS

## 2018-09-14 MED ORDER — LISINOPRIL 20 MG PO TABS
20.0000 mg | ORAL_TABLET | Freq: Every day | ORAL | Status: DC
  Administered 2018-09-15 – 2018-09-17 (×3): 20 mg via ORAL
  Filled 2018-09-14 (×3): qty 1

## 2018-09-14 MED ORDER — VITAMIN C 500 MG PO TABS
1000.0000 mg | ORAL_TABLET | Freq: Every day | ORAL | Status: DC
  Administered 2018-09-14 – 2018-09-17 (×4): 1000 mg via ORAL
  Filled 2018-09-14 (×4): qty 2

## 2018-09-14 MED ORDER — DEXAMETHASONE SODIUM PHOSPHATE 10 MG/ML IJ SOLN
INTRAMUSCULAR | Status: AC
  Filled 2018-09-14: qty 1

## 2018-09-14 MED ORDER — ONDANSETRON HCL 4 MG PO TABS: 4.0000 mg | ORAL_TABLET | Freq: Four times a day (QID) | ORAL | Status: DC | PRN

## 2018-09-14 MED ORDER — LACTATED RINGERS IV SOLN
INTRAVENOUS | Status: DC
  Administered 2018-09-14 (×2): via INTRAVENOUS

## 2018-09-14 MED ORDER — PROPOFOL 500 MG/50ML IV EMUL
INTRAVENOUS | Status: DC | PRN
  Administered 2018-09-14: 75 ug/kg/min via INTRAVENOUS

## 2018-09-14 MED ORDER — TRANEXAMIC ACID-NACL 1000-0.7 MG/100ML-% IV SOLN
1000.0000 mg | INTRAVENOUS | Status: AC
  Administered 2018-09-14: 1000 mg via INTRAVENOUS
  Filled 2018-09-14: qty 100

## 2018-09-14 SURGICAL SUPPLY — 43 items
ACETAB CUP W/GRIPTION 54 (Plate) ×2 IMPLANT
ARTICULEZE HEAD (Hips) ×2 IMPLANT
BAG ZIPLOCK 12X15 (MISCELLANEOUS) IMPLANT
BENZOIN TINCTURE PRP APPL 2/3 (GAUZE/BANDAGES/DRESSINGS) IMPLANT
BLADE SAW SGTL 18X1.27X75 (BLADE) ×2 IMPLANT
BLADE SURG SZ10 CARB STEEL (BLADE) ×2 IMPLANT
COVER PERINEAL POST (MISCELLANEOUS) ×2 IMPLANT
COVER SURGICAL LIGHT HANDLE (MISCELLANEOUS) ×2 IMPLANT
COVER WAND RF STERILE (DRAPES) IMPLANT
CUP ACETAB W/GRIPTION 54 (Plate) IMPLANT
DRAPE STERI IOBAN 125X83 (DRAPES) ×2 IMPLANT
DRAPE U-SHAPE 47X51 STRL (DRAPES) ×4 IMPLANT
DRESSING AQUACEL AG SP 3.5X10 (GAUZE/BANDAGES/DRESSINGS) IMPLANT
DRSG AQUACEL AG ADV 3.5X10 (GAUZE/BANDAGES/DRESSINGS) ×2 IMPLANT
DRSG AQUACEL AG SP 3.5X10 (GAUZE/BANDAGES/DRESSINGS) ×2
DURAPREP 26ML APPLICATOR (WOUND CARE) ×2 IMPLANT
ELECT REM PT RETURN 15FT ADLT (MISCELLANEOUS) ×2 IMPLANT
FEM STEM 12/14 TAPER SZ 4 HIP (Orthopedic Implant) ×2 IMPLANT
FEMORAL STEM 12/14 TPR SZ4 HIP (Orthopedic Implant) IMPLANT
GAUZE XEROFORM 1X8 LF (GAUZE/BANDAGES/DRESSINGS) ×2 IMPLANT
GLOVE BIO SURGEON STRL SZ7.5 (GLOVE) ×2 IMPLANT
GLOVE BIOGEL PI IND STRL 8 (GLOVE) ×2 IMPLANT
GLOVE BIOGEL PI INDICATOR 8 (GLOVE) ×2
GLOVE ECLIPSE 8.0 STRL XLNG CF (GLOVE) ×2 IMPLANT
GOWN STRL REUS W/TWL XL LVL3 (GOWN DISPOSABLE) ×4 IMPLANT
HANDPIECE INTERPULSE COAX TIP (DISPOSABLE) ×1
HEAD ARTICULEZE (Hips) IMPLANT
HOLDER FOLEY CATH W/STRAP (MISCELLANEOUS) ×2 IMPLANT
KIT TURNOVER KIT A (KITS) IMPLANT
LINER NEUTRAL 54X36MM PLUS 4 (Hips) ×1 IMPLANT
PACK ANTERIOR HIP CUSTOM (KITS) ×2 IMPLANT
SET HNDPC FAN SPRY TIP SCT (DISPOSABLE) ×1 IMPLANT
STAPLER VISISTAT 35W (STAPLE) ×1 IMPLANT
STRIP CLOSURE SKIN 1/2X4 (GAUZE/BANDAGES/DRESSINGS) IMPLANT
SUT ETHIBOND NAB CT1 #1 30IN (SUTURE) ×2 IMPLANT
SUT ETHILON 2 0 PS N (SUTURE) IMPLANT
SUT MNCRL AB 4-0 PS2 18 (SUTURE) IMPLANT
SUT VIC AB 0 CT1 36 (SUTURE) ×2 IMPLANT
SUT VIC AB 1 CT1 36 (SUTURE) ×2 IMPLANT
SUT VIC AB 2-0 CT1 27 (SUTURE) ×2
SUT VIC AB 2-0 CT1 TAPERPNT 27 (SUTURE) ×2 IMPLANT
TRAY FOLEY MTR SLVR 16FR STAT (SET/KITS/TRAYS/PACK) ×2 IMPLANT
YANKAUER SUCT BULB TIP 10FT TU (MISCELLANEOUS) ×2 IMPLANT

## 2018-09-14 NOTE — Anesthesia Postprocedure Evaluation (Signed)
Anesthesia Post Note  Patient: [name] [name]  Procedure(s) Performed: LEFT TOTAL HIP ARTHROPLASTY ANTERIOR APPROACH (Left )     Patient location during evaluation: PACU Anesthesia Type: Spinal Level of consciousness: awake and alert Pain management: pain level controlled Vital Signs Assessment: post-procedure vital signs reviewed and stable Respiratory status: spontaneous breathing, nonlabored ventilation and respiratory function stable Cardiovascular status: blood pressure returned to baseline and stable Postop Assessment: no apparent nausea or vomiting and spinal receding Anesthetic complications: no    Last Vitals:  Vitals:   09/14/18 1300 09/14/18 1312  BP: 104/60 116/65  Pulse: 74 79  Resp: 14 15  Temp:    SpO2: 100% 99%    Last Pain:  Vitals:   09/14/18 1312  TempSrc:   PainSc: 0-No pain                 Brennan Bailey

## 2018-09-14 NOTE — Op Note (Signed)
NAME: [name] [name], [name] [name] MEDICAL RECORD EL:[id number] ACCOUNT [id number] DATE OF BIRTH:[birth year] FACILITY: [facility]: [id number] PHYSICIAN:Sarahy Creedon [place], MD  OPERATIVE REPORT  DATE OF PROCEDURE:  09/14/2018  PREOPERATIVE DIAGNOSIS:  Primary osteoarthritis and degenerative joint disease, left hip.  POSTOPERATIVE DIAGNOSIS:  Primary osteoarthritis and degenerative joint disease, left hip.  PROCEDURE:  Left total hip arthroplasty through direct anterior approach.  IMPLANTS:  DePuy Sector Gription acetabular component size 54, size 36+4 polyethylene liner, size 4 ACTIS high-offset femoral component, size 36+5 metal hip ball.  SURGEON:  Lind Guest. Ninfa Linden, MD  ASSISTANT:  Erskine Emery, PA-C  ANESTHESIA:  Spinal.  ANTIBIOTICS:  900 mg IV clindamycin.  ESTIMATED BLOOD LOSS:  200 mL.  COMPLICATIONS:  None.  INDICATIONS:  The patient is a very pleasant 78 year old gentleman with very mild Alzheimer dementia.  He does have debilitating arthritis involving his left hip, and he has actually some collapse of the femoral head.  His pain is daily and it has  detrimentally affected his activities of daily living, his quality of life to the point that his family did come with him and did wish for [place] to proceed with a total hip arthroplasty.  He does answer questions appropriately and is alert and oriented and  just has some short-term memory issues.  At this point, we had a long and thorough discussion about hip replacement surgery.  We talked about the risks and benefits of surgery including the risk of acute blood loss anemia, nerve and vessel injury,  fracture, infection, dislocation, and a DVT and implant failure.  We talked about the goals of being decreased pain, improved mobility, and overall improved quality of life.  DESCRIPTION OF PROCEDURE:  After informed consent was obtained and appropriate left hip was marked, he was brought to the operating room and sat up on a  stretcher where spinal anesthesia was obtained.  I laid him supine on the stretcher.  I was able to  really get a good sense of his leg lengths.  His left operative side is certainly shorter than the right, and this does correlate with his preoperative plain standing films.  I then placed traction boots on both his feet and placed him supine on the Hana  fracture table with the perineal post in place and both legs in in-line skeletal traction device and no traction applied.  His left operative hip was prepped and draped with DuraPrep and sterile drapes.  A time-out was called, and he was identified as  correct patient, correct left hip.  I then made an incision just inferior and posterior to the anterior superior iliac spine and carried this obliquely down the leg.  I dissected down the tensor fascia lata muscle.  The tensor fascia was then divided  longitudinally so I could proceed with a direct anterior approach to the hip.  We identified and cauterized circumflex vessels.  I then identified the hip capsule posteriorly and curved retractors within the hip capsule on the medial and lateral aspect   once we had opened up the hip capsule.  We did find significant periarticular osteophytes around the femoral head and neck.  We then made our femoral neck cut with an oscillating saw just proximal to the lesser trochanter and completed this with an  osteotome.  We placed a corkscrew guide in the femoral head and removed the femoral head in its entirety and found a wide area devoid of cartilage and actually there is a flattened significant area.  I then placed  a bent Hohmann over the medial  acetabular rim and removed remnants of the acetabular labrum and other debris.  I then began reaming in stepwise increments from a size 44 reamer all the way up to a size 53 with all reamers under direct visualization.  The last reamer was also placed  under direct fluoroscopy so I could obtain my depth of reaming, my  inclination, and anteversion.  I was pleased with that position, so I placed the real DePuy Sector Gription acetabular component size 54.  I went with a 36+4 polyethylene liner given  slight medialization of the cup but also his higher offset.  Attention was then turned to the femur.  With the leg externally rotated to 120 degrees, extended and adducted, we placed the Mueller retractor medially and the Hohmann retractor behind the  greater trochanter.  I was able to release the lateral joint capsule and used a box-cutting osteotome to enter the femoral canal.  I then used a rongeur to lateralize this only slightly and began broaching using the ACTIS broaching system from DePuy,  going from a size 0 up to a size 4.  With the size 4, we had a nice tight fit, so I did trial a high-offset femoral neck, and we went with a 36+1.5 trial hip ball, reduced this in the acetabulum, and I felt like we needed just a little bit more offset  and leg length.  He was otherwise stable, and we appreciated the canal fill of the femoral component.  I then dislocated the hip and removed the trial components.  We placed the real ACTIS high offset femoral component size 4, and we went with a 36+5  metal hip ball.  I reduced this in the acetabulum, and I definitely increased his leg length and offset, and I was pleased with the positioning.  We put him through a range of motion as well and it felt stable.  I then irrigated the soft tissue with  normal saline solution using pulsatile lavage.  We closed the joint capsule with interrupted #1 Ethibond suture.  The tensor fascia was closed with #1 Vicryl, followed by 0 Vicryl to close the deep tissue, 2-0 Vicryl to close the subcutaneous tissue, and  interrupted staples were used on the skin.  Xeroform and an Aquacel dressing were applied.  He was taken off the Hana table and taken to recovery room in stable condition.  All final counts were correct.  There were no complications noted.   Note Benita Stabile, PA-C, assisted the entire case.  His assistance was crucial for facilitating all aspects of this case.  LN/NUANCE  D:09/14/2018 T:09/14/2018 JOB:[id number]

## 2018-09-14 NOTE — Transfer of Care (Signed)
Immediate Anesthesia Transfer of Care Note  Patient: [name] [name]  Procedure(s) Performed: LEFT TOTAL HIP ARTHROPLASTY ANTERIOR APPROACH (Left )  Patient Location: PACU  Anesthesia Type:Spinal  Level of Consciousness: awake, alert  and patient cooperative  Airway & Oxygen Therapy: Patient Spontanous Breathing and Patient connected to face mask oxygen  Post-op Assessment: Report given to RN and Post -op Vital signs reviewed and stable  Post vital signs: Reviewed and stable  Last Vitals:  Vitals Value Taken Time  BP 108/68 09/14/18 1145  Temp    Pulse 83 09/14/18 1145  Resp 17 09/14/18 1145  SpO2 100 % 09/14/18 1145  Vitals shown include unvalidated device data.  Last Pain:  Vitals:   09/14/18 0758  TempSrc:   PainSc: 0-No pain         Complications: No apparent anesthesia complications

## 2018-09-14 NOTE — Brief Op Note (Signed)
09/14/2018  11:24 AM  PATIENT:  [name] [name]  78 y.o. male  PRE-OPERATIVE DIAGNOSIS:  LEFT HIP OSTEOARTHRITIS  POST-OPERATIVE DIAGNOSIS:  LEFT HIP OSTEOARTHRITIS  PROCEDURE:  Procedure(s): LEFT TOTAL HIP ARTHROPLASTY ANTERIOR APPROACH (Left)  SURGEON:  Surgeon(s) and Role:    Mcarthur Rossetti, MD - Primary  PHYSICIAN ASSISTANT: Benita Stabile, PA-C  ANESTHESIA:   spinal  COUNTS:  YES  DICTATION: .Other Dictation: Dictation Number [id number]  PLAN OF CARE: Admit to inpatient   PATIENT DISPOSITION:  PACU - hemodynamically stable.   Delay start of Pharmacological VTE agent (>24hrs) due to surgical blood loss or risk of bleeding: no

## 2018-09-14 NOTE — Evaluation (Signed)
Physical Therapy Evaluation Patient Details Name: [name] [name] MRN: [id number] DOB: 1940/12/18 Today's Date: 09/14/2018   History of Present Illness  Pt s/p L THR and with hx of CAD, CKD, CHF, and dementia  Clinical Impression  Pt s/p  L THR and presents with decreased L LE strength/ROM and post op pain limiting functional mobility.  Pt should progress to dc home with family assist.    Follow Up Recommendations Home health PT;Follow surgeon's recommendation for DC plan and follow-up therapies    Equipment Recommendations  None recommended by PT    Recommendations for Other Services       Precautions / Restrictions Precautions Precautions: Fall Restrictions Weight Bearing Restrictions: No Other Position/Activity Restrictions: WBAT      Mobility  Bed Mobility Overal bed mobility: Needs Assistance Bed Mobility: Supine to Sit     Supine to sit: Min assist;Mod assist     General bed mobility comments: Increased time with cues for sequence and use of R LE to self assist  Transfers Overall transfer level: Needs assistance Equipment used: Rolling walker (2 wheeled) Transfers: Sit to/from Stand Sit to Stand: Min assist;Mod assist;+2 safety/equipment         General transfer comment: cues for LE management and use of UEs to self assist  Ambulation/Gait Ambulation/Gait assistance: Min assist;+2 safety/equipment Gait Distance (Feet): 28 Feet Assistive device: Rolling walker (2 wheeled) Gait Pattern/deviations: Step-to pattern;Decreased step length - right;Decreased step length - left;Shuffle;Trunk flexed Gait velocity: decr   General Gait Details: cues for sequence, posture and position from [employer]            Wheelchair Mobility    Modified Rankin (Stroke Patients Only)       Balance Overall balance assessment: Needs assistance Sitting-balance support: No upper extremity supported;Feet supported Sitting balance-Leahy Scale: Good     Standing  balance support: Bilateral upper extremity supported Standing balance-Leahy Scale: Poor                               Pertinent Vitals/Pain Pain Assessment: 0-10 Pain Score: 4  Pain Location: L hip Pain Descriptors / Indicators: Aching;Sore Pain Intervention(s): Limited activity within patient's tolerance;Monitored during session;Premedicated before session;Ice applied    Home Living Family/patient expects to be discharged to:: Private residence Living Arrangements: Children Available Help at Discharge: Family;Available 24 hours/day Type of Home: House Home Access: Level entry     Home Layout: One level Home Equipment: Crutches;Walker - 2 wheels      Prior Function Level of Independence: Independent;Independent with assistive device(s)         Comments: using crutch for support     Hand Dominance        Extremity/Trunk Assessment   Upper Extremity Assessment Upper Extremity Assessment: Overall WFL for tasks assessed    Lower Extremity Assessment Lower Extremity Assessment: LLE deficits/detail       Communication   Communication: HOH  Cognition Arousal/Alertness: Awake/alert Behavior During Therapy: WFL for tasks assessed/performed Overall Cognitive Status: Within Functional Limits for tasks assessed                                        General Comments      Exercises Total Joint Exercises Ankle Circles/Pumps: AROM;Supine   Assessment/Plan    PT Assessment Patient needs continued PT services  PT  Problem List Decreased strength;Decreased range of motion;Decreased activity tolerance;Decreased mobility;Pain;Decreased knowledge of use of DME       PT Treatment Interventions DME instruction;Gait training;Stair training;Functional mobility training;Therapeutic activities;Therapeutic exercise;Patient/family education    PT Goals (Current goals can be found in the Care Plan section)  Acute Rehab PT Goals Patient Stated  Goal: Regain IND PT Goal Formulation: With patient Time For Goal Achievement: 09/21/18 Potential to Achieve Goals: Good    Frequency 7X/week   Barriers to discharge        Co-evaluation               AM-PAC PT "6 Clicks" Mobility  Outcome Measure Help needed turning from your back to your side while in a flat bed without using bedrails?: A Lot Help needed moving from lying on your back to sitting on the side of a flat bed without using bedrails?: A Lot Help needed moving to and from a bed to a chair (including a wheelchair)?: A Lot Help needed standing up from a chair using your arms (e.g., wheelchair or bedside chair)?: A Lot Help needed to walk in hospital room?: A Lot Help needed climbing 3-5 steps with a railing? : A Lot 6 Click Score: 12    End of Session Equipment Utilized During Treatment: Gait belt Activity Tolerance: Patient tolerated treatment well;Patient limited by fatigue Patient left: in chair;with call bell/phone within reach;with chair alarm set Nurse Communication: Mobility status PT Visit Diagnosis: Difficulty in walking, not elsewhere classified (R26.2)    Time: 1543-1610 PT Time Calculation (min) (ACUTE ONLY): 27 min   Charges:   PT Evaluation $PT Eval Low Complexity: 1 Low PT Treatments $Gait Training: 8-22 mins        [place] Pager [phone] Office [phone]   Dareen Gutzwiller 09/14/2018, 5:52 PM

## 2018-09-14 NOTE — H&P (Signed)
TOTAL HIP ADMISSION H&P  Patient is admitted for left total hip arthroplasty.  Subjective:  Chief Complaint: left hip pain  HPI: [name] [name], 78 y.o. male, has a history of pain and functional disability in the left hip(s) due to arthritis and patient has failed non-surgical conservative treatments for greater than 12 weeks to include NSAID's and/or analgesics, corticosteriod injections, flexibility and strengthening excercises, use of assistive devices, weight reduction as appropriate and activity modification.  Onset of symptoms was gradual starting 2 years ago with gradually worsening course since that time.The patient noted no past surgery on the left hip(s).  Patient currently rates pain in the left hip at 10 out of 10 with activity. Patient has night pain, worsening of pain with activity and weight bearing, pain that interfers with activities of daily living and pain with passive range of motion. Patient has evidence of subchondral cysts, subchondral sclerosis, periarticular osteophytes and joint space narrowing by imaging studies. This condition presents safety issues increasing the risk of falls.  There is no current active infection.  Patient Active Problem List   Diagnosis Date Noted  . Primary osteoarthritis of left hip 09/06/2018  . Alzheimer's dementia ([place]) 12/07/2016  . 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  . 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  . Venous stasis dermatitis of left lower extremity 04/07/2016  . Bilateral impacted cerumen 01/18/2016  . 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  . Unsteadiness 01/18/2016  . 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  . Cellulitis of left lower leg   . Left leg swelling   . Cellulitis 08/23/2014  . Chronic congestive heart failure ([place]) 04/29/2014  . Chronic kidney disease, stage II (mild)   . Thrombocytopenia, unspecified ([place]) 07/29/2010  . Vitamin D deficiency 05/19/2010  . 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 Urinary urgency 05/19/2010  . 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 Benign essential hypertension 12/23/2009  . Actinic keratosis 12/23/2009  . Seborrheic keratosis 03/18/2009  . Benign neoplasm 03/18/2009  . Other and unspecified hyperlipidemia 01/18/2008  . Obstructive sleep apnea 01/18/2008  . Coronary atherosclerosis 01/18/2008  . Atherosclerotic heart disease of native coronary artery without angina pectoris 01/18/2008  . 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 Unspecified protein-calorie malnutrition ([place]) 09/27/2006  . Gastro-esophageal reflux disease with esophagitis 09/15/2006  . 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 CAD (coronary artery disease), native coronary artery 05/11/2005   Past Medical History:  Diagnosis Date  . Alzheimer disease ([place])   . CHF (congestive heart failure) ([place])   . Chronic kidney disease    mild insuffiency  . Coronary artery disease    LHC 4/10: Mid LAD 40-50%, then 70%, OM1 20-30%, EF 65%. Mid LAD FFR 0.89 (not hemodynamically significant). Medical therapy was continued.  . Hyperlipidemia   . LBBB (left bundle branch block) 06/2018  . Obstructive sleep apnea     Past Surgical History:  Procedure Laterality Date  . CARDIAC CATHETERIZATION  05/02/2003   single vessel,moderate stenosis mid LAD  . CARDIAC CATHETERIZATION  06/12/2008   continue med. therapy  . right shoulder surgery      No current facility-administered medications for this encounter.    Current Outpatient Medications  Medication Sig Dispense Refill Last Dose  . Ascorbic Acid (VITAMIN C) 1000 MG tablet Take 1,000 mg by mouth daily.       Marland Kitchen aspirin EC 81 MG tablet Take 81 mg by mouth daily.     Marland Kitchen  calcium-vitamin D (OSCAL WITH D) 500-200 MG-UNIT per tablet Take 1 tablet by mouth 2 (two) times daily.     Marland Kitchen donepezil (ARICEPT) 10 MG tablet Take 1 tablet by mouth daily.     . fluticasone  (FLONASE) 50 MCG/ACT nasal spray Place 2 sprays into both nostrils daily.     . furosemide (LASIX) 20 MG tablet Take 20 mg by mouth every other day.  30 tablet 6   . gabapentin (NEURONTIN) 100 MG capsule Take 100 mg by mouth at bedtime as needed (pain).      Marland Kitchen lisinopril (ZESTRIL) 20 MG tablet Take 20 mg by mouth daily.     . mirabegron ER (MYRBETRIQ) 25 MG TB24 tablet Take 25 mg by mouth daily.      . Multiple Vitamin (MULTIVITAMIN) tablet Take 1 tablet by mouth daily.       . Omega-3 Fatty Acids (FISH OIL) 600 MG CAPS Take 1 capsule by mouth 2 (two) times daily.      . Psyllium (METAMUCIL PO) Take 5 mLs by mouth daily.      . rosuvastatin (CRESTOR) 10 MG tablet Take 0.5 tablets (5 mg total) by mouth every other day. NEEDS APPOINTMENT FOR FUTURE REFILLS (Patient taking differently: Take 5 mg by mouth every other day. ) 30 tablet 0   . Saw Palmetto 450 MG CAPS Take 1 capsule by mouth 2 (two) times daily.       Allergies  Allergen Reactions  . Darifenacin     Other reaction(s): Mental Status Changes (intolerance) PT STATES IT MAKES HIM "BONKERS" AND VERY TIRED  . Enablex [Darifenacin Hydrobromide Er]     PT STATES IT MAKES HIM "BONKERS" AND VERY TIRED  . Ceftriaxone     Unknown reaction  . Atorvastatin     Muscle aches/weakness  . Ezetimibe     Other reaction(s): Other (See Comments) Muscle aches/weakness  . Levaquin [Levofloxacin] Diarrhea    Caused diarheer a   . Other     Intolerance to zetia and lipitor  . Oxybutynin Other (See Comments)    Memory impairment, temperament changes  . Sulfamethoxazole     Other reaction(s): Other (See Comments) Childhood allergy  . Sulfasalazine     Other reaction(s): Other (See Comments) Unknown per daughter  . Sulfonamide Derivatives     Social History   Tobacco Use  . Smoking status: Former Smoker    Years: 40.00    Types: Pipe    Quit date: 02/28/1978    Years since quitting: 40.5  . Smokeless tobacco: Never Used  . Tobacco comment:  smoked pipe only   Substance Use Topics  . Alcohol use: No    Alcohol/week: 0.0 standard drinks    Comment: rarely wine    Family History  Problem Relation Age of Onset  . Cancer Mother   . Hypertension Mother   . Other Father        old age  . Hypertension Father      Review of Systems  Musculoskeletal: Positive for joint pain.  All other systems reviewed and are negative.   Objective:  Physical Exam  Constitutional: He is oriented to person, place, and time. He appears well-developed and well-nourished.  HENT:  Head: Normocephalic and atraumatic.  Eyes: Pupils are equal, round, and reactive to light. EOM are normal.  Neck: Normal range of motion. Neck supple.  Cardiovascular: Normal rate and regular rhythm.  Respiratory: Effort normal and breath sounds normal.  GI: Soft. Bowel sounds  are normal.  Musculoskeletal:     Left hip: He exhibits decreased range of motion, decreased strength, tenderness and bony tenderness.  Neurological: He is alert and oriented to person, place, and time.  Skin: Skin is warm and dry.  Psychiatric: He has a normal mood and affect.    Vital signs in last 24 hours:    Labs:   Estimated body mass index is 29.82 kg/m as calculated from the following:   Height as of 09/11/18: 5\' 9"  (1.753 m).   Weight as of 09/11/18: 91.6 kg.   Imaging Review Plain radiographs demonstrate severe degenerative joint disease of the left hip(s). The bone quality appears to be good for age and reported activity level.      Assessment/Plan:  End stage arthritis, left hip(s)  The patient history, physical examination, clinical judgement of the provider and imaging studies are consistent with end stage degenerative joint disease of the left hip(s) and total hip arthroplasty is deemed medically necessary. The treatment options including medical management, injection therapy, arthroscopy and arthroplasty were discussed at length. The risks and benefits of total  hip arthroplasty were presented and reviewed. The risks due to aseptic loosening, infection, stiffness, dislocation/subluxation,  thromboembolic complications and other imponderables were discussed.  The patient acknowledged the explanation, agreed to proceed with the plan and consent was signed. Patient is being admitted for inpatient treatment for surgery, pain control, PT, OT, prophylactic antibiotics, VTE prophylaxis, progressive ambulation and ADL's and discharge planning.The patient is planning to be discharged home with home health services   Anticipated LOS equal to or greater than 2 midnights due to - Age 48 and older with one or more of the following:  - Obesity  - Expected need for hospital services (PT, OT, Nursing) required for safe  discharge  - Anticipated need for postoperative skilled nursing care or inpatient rehab  - Active co-morbidities: None OR   - Unanticipated findings during/Post Surgery: None  - Patient is a high risk of re-admission due to: None

## 2018-09-15 LAB — BASIC METABOLIC PANEL
Anion gap: 8 (ref 5–15)
BUN: 20 mg/dL (ref 8–23)
CO2: 26 mmol/L (ref 22–32)
Calcium: 9.4 mg/dL (ref 8.9–10.3)
Chloride: 104 mmol/L (ref 98–111)
Creatinine, Ser: 1 mg/dL (ref 0.61–1.24)
GFR calc Af Amer: >60 mL/min (ref >60)
GFR calc non Af Amer: >60 mL/min (ref >60)
Glucose, Bld: 152 mg/dL — ABNORMAL HIGH (ref 70–99)
Potassium: 4.2 mmol/L (ref 3.5–5.1)
Sodium: 138 mmol/L (ref 135–145)

## 2018-09-15 LAB — CBC
HCT: 34.5 % — ABNORMAL LOW (ref 39.0–52.0)
Hemoglobin: 11 g/dL — ABNORMAL LOW (ref 13.0–17.0)
MCH: 31 pg (ref 26.0–34.0)
MCHC: 31.9 g/dL (ref 30.0–36.0)
MCV: 97.2 fL (ref 80.0–100.0)
Platelets: 215 10*3/uL (ref 150–400)
RBC: 3.55 MIL/uL — ABNORMAL LOW (ref 4.22–5.81)
RDW: 12.7 % (ref 11.5–15.5)
WBC: 10.4 10*3/uL (ref 4.0–10.5)
nRBC: 0 % (ref 0.0–0.2)

## 2018-09-15 NOTE — Evaluation (Signed)
Occupational Therapy Evaluation Patient Details Name: [name] [name] MRN: [id number] DOB: [birth year] Today's Date: 09/15/2018    History of Present Illness Pt s/p L THR and with hx of CAD, CKD, CHF, and dementia   Clinical Impression   Pt admitted with above diagnosis, post operative pain limiting ability to engage in BADL at desired level of ind. Pt has history of early AD, impairing STM. He is concerned it will not remember all instructions from OT and wants to coordinate collateral with dtr, will arrange this. Pt is overall min guard for transfers and toilet t/f with BSC over top (has riser with handles at baseline). Instructed on BSC in tub as shower seat, as well as LB AE. Will continue to follow up per POC listed below. No OT follow up indicated at this time.    Follow Up Recommendations  No OT follow up;Supervision - Intermittent    Equipment Recommendations  3 in 1 bedside commode;Other (comment)(LB ADL equipment)    Recommendations for Other Services       Precautions / Restrictions Precautions Precautions: Fall Restrictions Weight Bearing Restrictions: No Other Position/Activity Restrictions: WBAT      Mobility Bed Mobility               General bed mobility comments: up in chair finishing with PT on arrival  Transfers Overall transfer level: Needs assistance Equipment used: Rolling walker (2 wheeled) Transfers: Sit to/from Stand Sit to Stand: Min assist;Min guard         General transfer comment: cues for sequencing and hand placement    Balance Overall balance assessment: Needs assistance Sitting-balance support: No upper extremity supported;Feet supported Sitting balance-Leahy Scale: Good     Standing balance support: Bilateral upper extremity supported Standing balance-Leahy Scale: Poor                             ADL either performed or assessed with clinical judgement   ADL Overall ADL's : Needs  assistance/impaired Eating/Feeding: Set up;Sitting   Grooming: Min guard;Standing   Upper Body Bathing: Min guard;Sitting   Lower Body Bathing: Minimal assistance;Sit to/from stand;Sitting/lateral leans;With adaptive equipment   Upper Body Dressing : Sitting;Set up   Lower Body Dressing: Minimal assistance;Sit to/from stand;Sitting/lateral leans;With adaptive equipment   Toilet Transfer: Min guard;BSC;Comfort height toilet;RW;Ambulation Armed forces technical officer Details (indicate cue type and reason): BSC over toilet, has toilet riser with handles at home [place] and Hygiene: Set up;Sit to/from stand;Sitting/lateral lean   Tub/ Shower Transfer: Minimal assistance;Shower seat;Ambulation   Functional mobility during ADLs: Min guard;Rolling walker General ADL Comments: 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; dtr helps at baseline with ADL     Vision Baseline Vision/History: Wears glasses Wears Glasses: At all times Patient Visual Report: No change from baseline       Perception     Praxis      Pertinent Vitals/Pain Pain Assessment: 0-10 Pain Score: 4  Pain Location: L hip Pain Descriptors / Indicators: Aching;Sore Pain Intervention(s): Limited activity within patient's tolerance;Monitored during session;Ice applied     Hand Dominance     Extremity/Trunk Assessment Upper Extremity Assessment Upper Extremity Assessment: Overall WFL for tasks assessed   Lower Extremity Assessment Lower Extremity Assessment: Defer to PT evaluation       Communication Communication Communication: HOH   Cognition Arousal/Alertness: Awake/alert Behavior During Therapy: WFL for tasks assessed/performed Overall Cognitive Status: History of cognitive impairments - at baseline  General Comments: 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 Overall functional for session, but concerned of remembering instructions prior to  d/c for dtr   General Comments       Exercises Exercises: Total Joint Total Joint Exercises Ankle Circles/Pumps: AROM;Supine;20 reps;Both Quad Sets: AROM;Both;10 reps;Supine Heel Slides: AAROM;Right;20 reps;Supine Hip ABduction/ADduction: AAROM;Right;15 reps;Supine   Shoulder Instructions      Home Living Family/patient expects to be discharged to:: Private residence Living Arrangements: Children(dtr) Available Help at Discharge: Family;Available 24 hours/day Type of Home: House Home Access: Level entry     Home Layout: One level     Bathroom Shower/Tub: Tub/shower unit;Walk-in shower   Bathroom Toilet: Handicapped height(toilet riser)     Home Equipment: Crutches;Walker - 2 wheels;Toilet riser(riser with handles)   Additional Comments: BSC in room this date      Prior Functioning/Environment Level of Independence: Independent;Independent with assistive device(s)                 OT Problem List: Decreased knowledge of use of DME or AE;Decreased activity tolerance;Decreased cognition;Impaired balance (sitting and/or standing);Decreased safety awareness;Pain      OT Treatment/Interventions: Self-care/ADL training;Therapeutic exercise;Patient/family education;Balance training;Energy conservation;Therapeutic activities;DME and/or AE instruction    OT Goals(Current goals can be found in the care plan section) Acute Rehab OT Goals Patient Stated Goal: return to ind OT Goal Formulation: With patient Time For Goal Achievement: 09/29/18 Potential to Achieve Goals: Good  OT Frequency: Min 2X/week   Barriers to D/C:            Co-evaluation              AM-PAC OT "6 Clicks" Daily Activity     Outcome Measure Help from another person eating meals?: None Help from another person taking care of personal grooming?: None Help from another person toileting, which includes using toliet, bedpan, or urinal?: A Little Help from another person bathing (including  washing, rinsing, drying)?: A Little Help from another person to put on and taking off regular upper body clothing?: None Help from another person to put on and taking off regular lower body clothing?: A Little 6 Click Score: 21   End of Session Equipment Utilized During Treatment: Gait belt;Rolling walker  Activity Tolerance: Patient tolerated treatment well Patient left: in chair;with call bell/phone within reach;with chair alarm set  OT Visit Diagnosis: Other abnormalities of gait and mobility (R26.89);Pain Pain - Right/Left: Left Pain - part of body: Hip                Time: 3893-7342 OT Time Calculation (min): 33 min Charges:  OT General Charges $OT Visit: 1 Visit OT Evaluation $OT Eval Low Complexity: 1 Low OT Treatments $Self Care/Home Management : 8-22 mins  Zenovia Jarred, MSOT, OTR/L Behavioral Health OT/ Acute Relief OT [facility] Office: [phone]   Zenovia Jarred 09/15/2018, 4:47 PM

## 2018-09-15 NOTE — Care Management Obs Status (Signed)
[place] NOTIFICATION   Patient Details  Name: CHARLY HOLCOMB MRN: [id number] Date of Birth: 04/20/1940   Medicare Observation Status Notification Given:  Yes    Joaquin Courts, RN 09/15/2018, 9:32 AM

## 2018-09-15 NOTE — TOC Initial Note (Signed)
Transition of Care [facility]) - Initial/Assessment Note    Patient Details  Name: [name] [name] MRN: [id number] Date of Birth: 21-May-1940  Transition [facility] CM/SW Contact:    Joaquin Courts, RN Phone Number: 09/15/2018, 11:31 AM  Clinical Narrative:                 CM spoke with patient at bedside. Patient set up with [facility] for [place]. [employer] to deliver 3-in-1 to bedside for home use. Patient reports he has a rolling walker at home.   Expected Discharge Plan: [place] Barriers to Discharge: Continued Medical Work up   Patient Goals and CMS Choice Patient states their goals for this hospitalization and ongoing recovery are:: to go home CMS Medicare.gov Compare Post Acute Care list provided to:: Patient Choice offered to / list presented to : Patient  Expected Discharge Plan and Services Expected Discharge Plan: [place]   Discharge Planning Services: CM Consult   Living arrangements for the past 2 months: Single Family Home                 DME Arranged: 3-N-1 DME Agency: [employer] Date DME Agency Contacted: 09/15/18 Time DME Agency Contacted: 9 Representative spoke with at DME Agency: [place]: PT [place]: [facility] (formerly [employer]) Date [place]: 09/15/18 Time [place]: [place] Representative spoke with at [place]: pre arranged by md office  Prior Living Arrangements/Services Living arrangements for the past 2 months: [place] with:: Adult Children Patient language and need for interpreter reviewed:: Yes Do you feel safe going back to the place where you live?: Yes      Need for Family Participation in Patient Care: Yes (Comment) Care giver support system in place?: Yes (comment)   Criminal Activity/Legal Involvement Pertinent to Current Situation/Hospitalization: No - Comment as needed  Activities of Daily Living Home Assistive  Devices/Equipment: Cane (specify quad or straight), Walker (specify type), Eyeglasses, Hearing aid, Grab bars in shower, Grab bars around toilet, Raised toilet seat with rails, Hand-held shower hose, CPAP ADL Screening (condition at time of admission) Patient's cognitive ability adequate to safely complete daily activities?: Yes Is the patient deaf or have difficulty hearing?: Yes Does the patient have difficulty seeing, even when wearing glasses/contacts?: No Does the patient have difficulty concentrating, remembering, or making decisions?: Yes Patient able to express need for assistance with ADLs?: Yes Does the patient have difficulty dressing or bathing?: No Independently performs ADLs?: Yes (appropriate for developmental age) Does the patient have difficulty walking or climbing stairs?: Yes Weakness of Legs: None Weakness of Arms/Hands: Right  Permission Sought/Granted                  Emotional Assessment Appearance:: Appears stated age Attitude/Demeanor/Rapport: Engaged Affect (typically observed): Accepting Orientation: : Oriented to  Time, Oriented to Situation, Oriented to Place, Oriented to Self   Psych Involvement: No (comment)  Admission diagnosis:  LEFT HIP OSTEOARTHRITIS Patient Active Problem List   Diagnosis Date Noted  . 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  . Primary osteoarthritis of left hip 09/06/2018  . Alzheimer's dementia ([place]) 12/07/2016  . 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  . 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  . Venous stasis dermatitis of left lower extremity 04/07/2016  . Bilateral impacted cerumen 01/18/2016  . 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  . Unsteadiness 01/18/2016  . Chronic venous  stasis dermatitis 10/22/2015  . Cellulitis of left lower leg   . Left leg swelling   . Cellulitis 08/23/2014  . Chronic congestive heart failure ([place]) 04/29/2014  . Chronic kidney disease, stage II (mild)   .  Thrombocytopenia, unspecified ([place]) 07/29/2010  . Vitamin D deficiency 05/19/2010  . Depression, major, recurrent, mild ([place]) 05/19/2010  . Urinary urgency 05/19/2010  . 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 Benign essential hypertension 12/23/2009  . Actinic keratosis 12/23/2009  . Seborrheic keratosis 03/18/2009  . Benign neoplasm 03/18/2009  . Other and unspecified hyperlipidemia 01/18/2008  . Obstructive sleep apnea 01/18/2008  . Coronary atherosclerosis 01/18/2008  . Atherosclerotic heart disease of native coronary artery without angina pectoris 01/18/2008  . 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 Unspecified protein-calorie malnutrition ([place]) 09/27/2006  . Gastro-esophageal reflux disease with esophagitis 09/15/2006  . 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 CAD (coronary artery disease), native coronary artery 05/11/2005   PCP:  Chesley Noon, MD Pharmacy:   [facility] [phone] - [place], [place] - [address] [place] [address] Phone: [phone] Fax: [phone]     Social Determinants of Health (SDOH) Interventions    Readmission Risk Interventions No flowsheet data found.

## 2018-09-15 NOTE — Progress Notes (Signed)
Family called and updated on pt overall condition and plan.  rn answered questions from family. No needs at this time.  Pt remains stable.

## 2018-09-15 NOTE — Progress Notes (Signed)
Subjective: 1 Day Post-Op Procedure(s) (LRB): LEFT TOTAL HIP ARTHROPLASTY ANTERIOR APPROACH (Left) Patient reports pain as mild.  Has been up in his room.  Early Alzheimer's which makes him concerning for a fall risk.  Objective: Vital signs in last 24 hours: Temp:  [97.5 F (36.4 C)-98.3 F (36.8 C)] 98.3 F (36.8 C) (07/18 0177) Pulse Rate:  [56-87] 57 (07/18 0608) Resp:  [10-18] 16 (07/18 0608) BP: (91-150)/(51-86) 101/51 (07/18 0608) SpO2:  [97 %-100 %] 100 % (07/18 9390) Weight:  [91.6 kg] 91.6 kg (07/17 1314)  Intake/Output from previous day: 07/17 0701 - 07/18 0700 In: 3381.8 [P.O.:480; I.V.:2651.8; IV Piggyback:250] Out: 2000 [Urine:1800; Blood:200] Intake/Output this shift: Total I/O In: 360 [P.O.:360] Out: -   Recent Labs    09/15/18 0438  HGB 11.0*   Recent Labs    09/15/18 0438  WBC 10.4  RBC 3.55*  HCT 34.5*  PLT 215   Recent Labs    09/15/18 0438  NA 138  K 4.2  CL 104  CO2 26  BUN 20  CREATININE 1.00  GLUCOSE 152*  CALCIUM 9.4   No results for input(s): LABPT, INR in the last 72 hours.  Sensation intact distally Intact pulses distally Dorsiflexion/Plantar flexion intact Incision: dressing C/D/I   Assessment/Plan: 1 Day Post-Op Procedure(s) (LRB): LEFT TOTAL HIP ARTHROPLASTY ANTERIOR APPROACH (Left) Up with therapy Plan for discharge tomorrow Discharge home with home health      Mcarthur Rossetti 09/15/2018, 9:32 AM

## 2018-09-15 NOTE — Plan of Care (Signed)
  Problem: Education: Goal: Knowledge of General Education information will improve Description: Including pain rating scale, medication(s)/side effects and non-pharmacologic comfort measures Outcome: Progressing   Problem: Pain Management: Goal: Pain level will decrease with appropriate interventions Outcome: Progressing   Problem: Clinical Measurements: Goal: Postoperative complications will be avoided or minimized Outcome: Progressing   Problem: Clinical Measurements: Goal: Respiratory complications will improve Outcome: Progressing

## 2018-09-15 NOTE — Plan of Care (Signed)
  Problem: Education: Goal: Knowledge of the prescribed therapeutic regimen will improve Outcome: Progressing   Problem: Pain Management: Goal: Pain level will decrease with appropriate interventions Outcome: Progressing   

## 2018-09-15 NOTE — Discharge Instructions (Signed)

## 2018-09-15 NOTE — Progress Notes (Signed)
   09/14/18 2105  Mechanical VTE Prophylaxis (All Areas)  Mechanical VTE Prophylaxis Antiembolism stockings, knee (TED hose);Sequential compression devices, below knee;Bilateral lower extremities  Mechanical VTE Prophylaxis Intervention Patient refused and educated (Pt stated "I dont wear them at night." RN Notified.)   NT made RN aware that Pt refused Mechanical VTE prophylaxis. Pt. Educated and continues to refuse.

## 2018-09-15 NOTE — Progress Notes (Signed)
Physical Therapy Treatment Patient Details Name: [name] [name] MRN: [id number] DOB: February 14, 1941 Today's Date: 09/15/2018    History of Present Illness Pt s/p L THR and with hx of CAD, CKD, CHF, and dementia    PT Comments    Pt continues very cooperative and progressing steadily with mobility including decreased assist for transfers and increased distance ambulated with improving stability.   Follow Up Recommendations  Home health PT;Follow surgeon's recommendation for DC plan and follow-up therapies     Equipment Recommendations  None recommended by PT    Recommendations for Other Services       Precautions / Restrictions Precautions Precautions: Fall Restrictions Weight Bearing Restrictions: No Other Position/Activity Restrictions: WBAT    Mobility  Bed Mobility               General bed mobility comments: Pt up in chair and requests back to same  Transfers Overall transfer level: Needs assistance Equipment used: Rolling walker (2 wheeled) Transfers: Sit to/from Stand Sit to Stand: Min assist;Min guard         General transfer comment: cues for sequencing and hand placement  Ambulation/Gait Ambulation/Gait assistance: Min assist Gait Distance (Feet): 170 Feet Assistive device: Rolling walker (2 wheeled) Gait Pattern/deviations: Decreased step length - right;Decreased step length - left;Shuffle;Trunk flexed;Step-to pattern;Step-through pattern Gait velocity: decr   General Gait Details: cues for sequence, posture and position from [employer] Mobility    Modified Rankin (Stroke Patients Only)       Balance Overall balance assessment: Needs assistance Sitting-balance support: No upper extremity supported;Feet supported Sitting balance-Leahy Scale: Good     Standing balance support: Bilateral upper extremity supported Standing balance-Leahy Scale: Poor                              Cognition  Arousal/Alertness: Awake/alert Behavior During Therapy: WFL for tasks assessed/performed Overall Cognitive Status: History of cognitive impairments - at baseline                                 General Comments: 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 Overall functional for session, but concerned of remembering instructions prior to d/c for dtr      Exercises Total Joint Exercises Ankle Circles/Pumps: AROM;Supine;20 reps;Both Quad Sets: AROM;Both;10 reps;Supine Heel Slides: AAROM;Right;20 reps;Supine Hip ABduction/ADduction: AAROM;Right;15 reps;Supine    General Comments        Pertinent Vitals/Pain Pain Assessment: 0-10 Pain Score: 4  Pain Location: L hip Pain Descriptors / Indicators: Aching;Sore Pain Intervention(s): Limited activity within patient's tolerance;Monitored during session;Premedicated before session;Ice applied    Home Living Family/patient expects to be discharged to:: Private residence Living Arrangements: Children(dtr) Available Help at Discharge: Family;Available 24 hours/day Type of Home: House Home Access: Level entry   Home Layout: One level Home Equipment: Crutches;Walker - 2 wheels;Toilet riser(riser with handles) Additional Comments: BSC in room this date    Prior Function Level of Independence: Independent;Independent with assistive device(s)          PT Goals (current goals can now be found in the care plan section) Acute Rehab PT Goals Patient Stated Goal: return to ind PT Goal Formulation: With patient Time For Goal Achievement: 09/21/18 Potential to Achieve Goals: Good Progress towards PT goals: Progressing toward goals  Frequency    7X/week      PT Plan Current plan remains appropriate    Co-evaluation              AM-PAC PT "6 Clicks" Mobility   Outcome Measure  Help needed turning from your back to your side while in a flat bed without using bedrails?: A Lot Help needed moving from  lying on your back to sitting on the side of a flat bed without using bedrails?: A Lot Help needed moving to and from a bed to a chair (including a wheelchair)?: A Little Help needed standing up from a chair using your arms (e.g., wheelchair or bedside chair)?: A Little Help needed to walk in hospital room?: A Little Help needed climbing 3-5 steps with a railing? : A Little 6 Click Score: 16    End of Session Equipment Utilized During Treatment: Gait belt Activity Tolerance: Patient tolerated treatment well Patient left: in chair;Other (comment)(in chair with OT in room) Nurse Communication: Mobility status PT Visit Diagnosis: Difficulty in walking, not elsewhere classified (R26.2)     Time: 9518-8416 PT Time Calculation (min) (ACUTE ONLY): 22 min  Charges:  $Gait Training: 8-22 mins $Therapeutic Exercise: 8-22 mins                     [place] Pager [phone] Office [phone]    Maevis Mumby 09/15/2018, 4:59 PM

## 2018-09-15 NOTE — Progress Notes (Signed)
    Home health agencies that serve [id number].        Home Health Agencies Search Results  Results List Table  Home Health Agency Information Quality of Patient Care Rating Patient Survey Summary Rating  [facility] [phone] 4 out of 5 stars 4 out of 5 stars  [facility] [phone] 4  out of 5 stars 3 out of 5 stars  [facility] [phone] 4 out of 5 stars 4 out of 5 stars  [facility] [phone] 4 out of 5 stars 4 out of 5 stars  [facility] [phone] 3  out of 5 stars 4 out of 5 stars  [facility] [phone] 3 out of 5 stars 4 out of 5 stars  [facility] [phone] 4 out of 5 stars Not Available12  [facility] [phone] 3  out of 5 stars 3 out of 5 stars  [facility] [phone] 3  out of 5 stars 4 out of 5 stars  [facility] [phone] 3  out of 5 stars 3 out of 5 stars  [facility] [phone] 4  out of 5 stars 3 out of 5 stars  [facility] [phone] 4  out of 5 stars 2 out of 5 stars   Home Health Footnotes  Footnote number Footnote as displayed on Home Health Compare  1 This agency provides services under a federal waiver program to non-traditional, chronic long term population.  2 This agency provides services to a special needs population.  3 Not Available.  4 The number of patient episodes for this measure is too small to report.  5 This measure currently does not have data or provider has been certified/recertified for less than 6 months.  6 The national average for this measure is not provided because of state-to-state differences in data collection.  7 Medicare is not displaying rates for this measure for any home health agency, because of an issue with the data.  8 There were problems with the data and they are being corrected.  9 Zero, or very few,  patients met the survey's rules for inclusion. The scores shown, if any, reflect a very small number of surveys and may not accurately tell how an agency is doing.  10 Survey results are based on less than 12 months of data.  11 Fewer than 70 patients completed the survey. Use the scores shown, if any, with caution as the number of surveys may be too low to accurately tell how an agency is doing.  12 No survey results are available for this period.  13 Data suppressed by CMS for one or more quarters.    

## 2018-09-15 NOTE — Progress Notes (Signed)
   09/14/18 2103 09/15/18 0204  MEWS Score  Pulse Rate (!) 56 (!) 58   RN will continue to monitor.

## 2018-09-15 NOTE — Progress Notes (Signed)
Physical Therapy Treatment Patient Details Name: [name] [name] MRN: [id number] DOB: 1940-05-19 Today's Date: 09/15/2018    History of Present Illness Pt s/p L THR and with hx of CAD, CKD, CHF, and dementia    PT Comments    Pt very cooperative and progressing well with mobility but with moderate cueing for safety awareness.   Follow Up Recommendations  Home health PT;Follow surgeon's recommendation for DC plan and follow-up therapies     Equipment Recommendations  None recommended by PT    Recommendations for Other Services       Precautions / Restrictions Precautions Precautions: Fall Restrictions Weight Bearing Restrictions: No Other Position/Activity Restrictions: WBAT    Mobility  Bed Mobility               General bed mobility comments: Pt up in chair and requests back to same  Transfers Overall transfer level: Needs assistance Equipment used: Rolling walker (2 wheeled) Transfers: Sit to/from Stand Sit to Stand: Min assist         General transfer comment: cues for LE management and use of UEs to self assist  Ambulation/Gait Ambulation/Gait assistance: Min assist Gait Distance (Feet): 140 Feet Assistive device: Rolling walker (2 wheeled) Gait Pattern/deviations: Step-to pattern;Decreased step length - right;Decreased step length - left;Shuffle;Trunk flexed Gait velocity: decr   General Gait Details: cues for sequence, posture and position from [employer]             Wheelchair Mobility    Modified Rankin (Stroke Patients Only)       Balance Overall balance assessment: Needs assistance Sitting-balance support: No upper extremity supported;Feet supported Sitting balance-Leahy Scale: Good     Standing balance support: Bilateral upper extremity supported Standing balance-Leahy Scale: Poor                              Cognition Arousal/Alertness: Awake/alert Behavior During Therapy: WFL for tasks  assessed/performed Overall Cognitive Status: Within Functional Limits for tasks assessed                                        Exercises Total Joint Exercises Ankle Circles/Pumps: AROM;Supine;20 reps;Both Quad Sets: AROM;Both;10 reps;Supine Heel Slides: AAROM;Right;20 reps;Supine Hip ABduction/ADduction: AAROM;Right;15 reps;Supine    General Comments        Pertinent Vitals/Pain Pain Assessment: 0-10 Pain Score: 4  Pain Location: L hip Pain Descriptors / Indicators: Aching;Sore Pain Intervention(s): Limited activity within patient's tolerance;Monitored during session;Premedicated before session;Ice applied    Home Living                      Prior Function            PT Goals (current goals can now be found in the care plan section) Acute Rehab PT Goals Patient Stated Goal: Regain IND PT Goal Formulation: With patient Time For Goal Achievement: 09/21/18 Potential to Achieve Goals: Good Progress towards PT goals: Progressing toward goals    Frequency    7X/week      PT Plan Current plan remains appropriate    Co-evaluation              AM-PAC PT "6 Clicks" Mobility   Outcome Measure  Help needed turning from your back to your side while in a flat bed without using bedrails?: A Lot  Help needed moving from lying on your back to sitting on the side of a flat bed without using bedrails?: A Lot Help needed moving to and from a bed to a chair (including a wheelchair)?: A Little Help needed standing up from a chair using your arms (e.g., wheelchair or bedside chair)?: A Little Help needed to walk in hospital room?: A Little Help needed climbing 3-5 steps with a railing? : A Lot 6 Click Score: 15    End of Session Equipment Utilized During Treatment: Gait belt Activity Tolerance: Patient tolerated treatment well Patient left: in chair;with call bell/phone within reach;with chair alarm set Nurse Communication: Mobility status PT  Visit Diagnosis: Difficulty in walking, not elsewhere classified (R26.2)     Time: 0301-3143 PT Time Calculation (min) (ACUTE ONLY): 30 min  Charges:  $Gait Training: 8-22 mins $Therapeutic Exercise: 8-22 mins                     [place] Pager [phone] Office [phone]    Maude Hettich 09/15/2018, 1:56 PM

## 2018-09-16 DIAGNOSIS — Z881 Allergy status to other antibiotic agents status: Secondary | ICD-10-CM | POA: Diagnosis not present

## 2018-09-16 DIAGNOSIS — Z79899 Other long term (current) drug therapy: Secondary | ICD-10-CM | POA: Diagnosis not present

## 2018-09-16 DIAGNOSIS — Z888 Allergy status to other drugs, medicaments and biological substances status: Secondary | ICD-10-CM | POA: Diagnosis not present

## 2018-09-16 DIAGNOSIS — Z7982 Long term (current) use of aspirin: Secondary | ICD-10-CM | POA: Diagnosis not present

## 2018-09-16 DIAGNOSIS — N182 Chronic kidney disease, stage 2 (mild): Secondary | ICD-10-CM | POA: Diagnosis present

## 2018-09-16 DIAGNOSIS — G4733 Obstructive sleep apnea (adult) (pediatric): Secondary | ICD-10-CM | POA: Diagnosis present

## 2018-09-16 DIAGNOSIS — Z809 Family history of malignant neoplasm, unspecified: Secondary | ICD-10-CM | POA: Diagnosis not present

## 2018-09-16 DIAGNOSIS — M1612 Unilateral primary osteoarthritis, left hip: Secondary | ICD-10-CM | POA: Diagnosis present

## 2018-09-16 DIAGNOSIS — I130 Hypertensive heart and chronic kidney disease with heart failure and stage 1 through stage 4 chronic kidney disease, or unspecified chronic kidney disease: Secondary | ICD-10-CM | POA: Diagnosis present

## 2018-09-16 DIAGNOSIS — K210 Gastro-esophageal reflux disease with esophagitis: Secondary | ICD-10-CM | POA: Diagnosis present

## 2018-09-16 DIAGNOSIS — Z87891 Personal history of nicotine dependence: Secondary | ICD-10-CM | POA: Diagnosis not present

## 2018-09-16 DIAGNOSIS — Z882 Allergy status to sulfonamides status: Secondary | ICD-10-CM | POA: Diagnosis not present

## 2018-09-16 DIAGNOSIS — Z8249 Family history of ischemic heart disease and other diseases of the circulatory system: Secondary | ICD-10-CM | POA: Diagnosis not present

## 2018-09-16 DIAGNOSIS — G309 Alzheimer's disease, unspecified: Secondary | ICD-10-CM | POA: Diagnosis present

## 2018-09-16 DIAGNOSIS — I447 Left bundle-branch block, unspecified: Secondary | ICD-10-CM | POA: Diagnosis present

## 2018-09-16 DIAGNOSIS — I509 Heart failure, unspecified: Secondary | ICD-10-CM | POA: Diagnosis present

## 2018-09-16 DIAGNOSIS — E669 Obesity, unspecified: Secondary | ICD-10-CM | POA: Diagnosis present

## 2018-09-16 DIAGNOSIS — Z6829 Body mass index (BMI) 29.0-29.9, adult: Secondary | ICD-10-CM | POA: Diagnosis not present

## 2018-09-16 DIAGNOSIS — I2510 Atherosclerotic heart disease of native coronary artery without angina pectoris: Secondary | ICD-10-CM | POA: Diagnosis present

## 2018-09-16 DIAGNOSIS — H903 Sensorineural hearing loss, bilateral: Secondary | ICD-10-CM | POA: Diagnosis present

## 2018-09-16 DIAGNOSIS — F0280 Dementia in other diseases classified elsewhere without behavioral disturbance: Secondary | ICD-10-CM | POA: Diagnosis present

## 2018-09-16 DIAGNOSIS — Z7951 Long term (current) use of inhaled steroids: Secondary | ICD-10-CM | POA: Diagnosis not present

## 2018-09-16 DIAGNOSIS — E785 Hyperlipidemia, unspecified: Secondary | ICD-10-CM | POA: Diagnosis present

## 2018-09-16 MED ORDER — ASPIRIN 81 MG PO CHEW: 81.0000 mg | CHEWABLE_TABLET | Freq: Two times a day (BID) | ORAL | 0 refills | Status: DC

## 2018-09-16 MED ORDER — HYDROCODONE-ACETAMINOPHEN 5-325 MG PO TABS: 1.0000 | ORAL_TABLET | ORAL | 0 refills | Status: DC | PRN

## 2018-09-16 NOTE — Progress Notes (Signed)
Physical Therapy Treatment Patient Details Name: [name] [name] MRN: [id number] DOB: [birth year] Today's Date: 09/16/2018    History of Present Illness Pt s/p L THR and with hx of CAD, CKD, CHF, and dementia    PT Comments    Pt with marked improvement in activity tolerance from this am but continues to require min assist to min guard with cues for safe performance of all mobility tasks.   Follow Up Recommendations  Home health PT;Follow surgeon's recommendation for DC plan and follow-up therapies     Equipment Recommendations  None recommended by PT    Recommendations for Other Services       Precautions / Restrictions Precautions Precautions: Fall Restrictions LLE Weight Bearing: Weight bearing as tolerated    Mobility  Bed Mobility               General bed mobility comments: Pt up in chair and insisting on return to chair  Transfers Overall transfer level: Needs assistance Equipment used: Rolling walker (2 wheeled) Transfers: Sit to/from Stand Sit to Stand: Min assist         General transfer comment: cues for transition position, LE management and use of UEs to self assist.  Physical assist to bring wt up and fwd and to balance in initial standing   Ambulation/Gait Ambulation/Gait assistance: Min assist;Min guard Gait Distance (Feet): 145 Feet Assistive device: Rolling walker (2 wheeled) Gait Pattern/deviations: Decreased step length - right;Decreased step length - left;Shuffle;Trunk flexed;Step-to pattern;Step-through pattern Gait velocity: decr   General Gait Details: Increased time with cues for sequence, posture and position from RW.  Pt with initial difficulty advancing L LE but improved with increased distance ambulated.     Stairs             Wheelchair Mobility    Modified Rankin (Stroke Patients Only)       Balance Overall balance assessment: Needs assistance Sitting-balance support: No upper extremity supported;Feet  supported Sitting balance-Leahy Scale: Good     Standing balance support: Bilateral upper extremity supported Standing balance-Leahy Scale: Poor                              Cognition Arousal/Alertness: Awake/alert Behavior During Therapy: WFL for tasks assessed/performed Overall Cognitive Status: History of cognitive impairments - at baseline                                        Exercises Total Joint Exercises Ankle Circles/Pumps: AROM;Supine;20 reps;Both Quad Sets: AROM;Both;10 reps;Supine Heel Slides: AAROM;Right;20 reps;Supine Hip ABduction/ADduction: AAROM;Right;15 reps;Supine Long Arc Quad: AAROM;AROM;Both;10 reps;Seated    General Comments        Pertinent Vitals/Pain Pain Assessment: 0-10 Pain Score: 4  Pain Location: L hip Pain Descriptors / Indicators: Aching;Sore;Tightness Pain Intervention(s): Limited activity within patient's tolerance;Monitored during session;Premedicated before session;Ice applied    Home Living                      Prior Function            PT Goals (current goals can now be found in the care plan section) Acute Rehab PT Goals Patient Stated Goal: return to ind PT Goal Formulation: With patient Time For Goal Achievement: 09/21/18 Potential to Achieve Goals: Good Progress towards PT goals: Progressing toward goals    Frequency  7X/week      PT Plan Current plan remains appropriate    Co-evaluation              AM-PAC PT "6 Clicks" Mobility   Outcome Measure  Help needed turning from your back to your side while in a flat bed without using bedrails?: A Lot Help needed moving from lying on your back to sitting on the side of a flat bed without using bedrails?: A Lot Help needed moving to and from a bed to a chair (including a wheelchair)?: A Little Help needed standing up from a chair using your arms (e.g., wheelchair or bedside chair)?: A Little Help needed to walk in  hospital room?: A Little Help needed climbing 3-5 steps with a railing? : A Lot 6 Click Score: 15    End of Session Equipment Utilized During Treatment: Gait belt Activity Tolerance: Patient tolerated treatment well Patient left: in chair;with call bell/phone within reach;with chair alarm set Nurse Communication: Mobility status PT Visit Diagnosis: Difficulty in walking, not elsewhere classified (R26.2)     Time: 1610-9604 PT Time Calculation (min) (ACUTE ONLY): 44 min  Charges:  $Gait Training: 23-37 mins $Therapeutic Exercise: 8-22 mins                     [place] Pager [phone] Office [phone]    [name] [name] 09/16/2018, 3:30 PM

## 2018-09-16 NOTE — Progress Notes (Signed)
Patient ID: [name] [name], male   DOB: 1940/12/01, 78 y.o.   MRN: [id number] Due to the patient having slow mobility and with him being a fall risk, it has been recommended that the patient stay an extra day for mobility purposes.  This is also in light of his early Alzheimer's dementia.  This is been requested by physical therapy as well as nursing and I agree with this due to the patient's safety coming first.

## 2018-09-16 NOTE — Progress Notes (Signed)
Physical Therapy Treatment Patient Details Name: [name] [name] MRN: [id number] DOB: 1940-10-31 Today's Date: 09/16/2018    History of Present Illness Pt s/p L THR and with hx of CAD, CKD, CHF, and dementia    PT Comments    Pt continues cooperative with with increased difficulty performing all tasks this am.  Pt c/o increased pain/stiffness - RN aware.   Follow Up Recommendations  Home health PT;Follow surgeon's recommendation for DC plan and follow-up therapies     Equipment Recommendations  None recommended by PT    Recommendations for Other Services       Precautions / Restrictions Precautions Precautions: Fall Restrictions LLE Weight Bearing: Weight bearing as tolerated    Mobility  Bed Mobility               General bed mobility comments: OOB with nursing  Transfers Overall transfer level: Needs assistance Equipment used: Rolling walker (2 wheeled) Transfers: Sit to/from Stand Sit to Stand: Min assist         General transfer comment: cues for transition position, LE management and use of UEs to self assist.  Physical assist to bring wt up and fwd and to balance in initial standing   Ambulation/Gait Ambulation/Gait assistance: Min assist Gait Distance (Feet): 16 Feet Assistive device: Rolling walker (2 wheeled) Gait Pattern/deviations: Decreased step length - right;Decreased step length - left;Shuffle;Trunk flexed;Step-to pattern;Step-through pattern     General Gait Details: Increased time with cues for sequence, posture and position from RW.  Pt with increased difficulty advancing L LE with no improvement noted with reverse sequence.  Pt c/o increased pain/stiffness with movement   Stairs             Wheelchair Mobility    Modified Rankin (Stroke Patients Only)       Balance Overall balance assessment: Needs assistance Sitting-balance support: No upper extremity supported;Feet supported Sitting balance-Leahy Scale: Good      Standing balance support: Bilateral upper extremity supported Standing balance-Leahy Scale: Poor                              Cognition Arousal/Alertness: Awake/alert Behavior During Therapy: WFL for tasks assessed/performed Overall Cognitive Status: History of cognitive impairments - at baseline                                 General Comments: Pt had difficulty, extra processing time to follow sequence.  Allowed pt to step with either leg as he is WBAT.  Multimodal cues given for safety and assist to keep walker at safe distance from him.  He also needed cues during ADL for thoroughness      Exercises      General Comments        Pertinent Vitals/Pain Pain Assessment: 0-10 Pain Score: 7  Faces Pain Scale: Hurts a little bit Pain Location: L hip Pain Descriptors / Indicators: Aching;Sore;Tightness Pain Intervention(s): Limited activity within patient's tolerance;Monitored during session;Patient requesting pain meds-RN notified;Ice applied    Home Living                      Prior Function            PT Goals (current goals can now be found in the care plan section) Acute Rehab PT Goals Patient Stated Goal: return to ind PT Goal Formulation: With patient Time  For Goal Achievement: 09/21/18 Potential to Achieve Goals: Good Progress towards PT goals: Not progressing toward goals - comment(increased pain/stiffness)    Frequency    7X/week      PT Plan Current plan remains appropriate    Co-evaluation              AM-PAC PT "6 Clicks" Mobility   Outcome Measure  Help needed turning from your back to your side while in a flat bed without using bedrails?: A Lot Help needed moving from lying on your back to sitting on the side of a flat bed without using bedrails?: A Lot Help needed moving to and from a bed to a chair (including a wheelchair)?: A Lot Help needed standing up from a chair using your arms (e.g., wheelchair  or bedside chair)?: A Little Help needed to walk in hospital room?: A Little Help needed climbing 3-5 steps with a railing? : A Lot 6 Click Score: 14    End of Session Equipment Utilized During Treatment: Gait belt Activity Tolerance: Patient limited by pain Patient left: in chair Nurse Communication: Mobility status;Patient requests pain meds PT Visit Diagnosis: Difficulty in walking, not elsewhere classified (R26.2)     Time: 6222-9798 PT Time Calculation (min) (ACUTE ONLY): 18 min  Charges:  $Gait Training: 8-22 mins                     [place] Pager [phone] Office [phone]    Mykale Gandolfo 09/16/2018, 12:20 PM

## 2018-09-16 NOTE — Discharge Summary (Signed)
Patient ID: [name] [name] MRN: [id number] DOB/AGE: Jul 09, 1940 78 y.o.  Admit date: 09/14/2018 Discharge date: 09/16/2018  Admission Diagnoses:  Principal Problem:   Primary osteoarthritis of left hip Active Problems:   Status post total replacement of left hip   Discharge Diagnoses:  Same  Past Medical History:  Diagnosis Date  . Alzheimer disease ([place])   . CHF (congestive heart failure) ([place])   . Chronic kidney disease    mild insuffiency  . Coronary artery disease    LHC 4/10: Mid LAD 40-50%, then 70%, OM1 20-30%, EF 65%. Mid LAD FFR 0.89 (not hemodynamically significant). Medical therapy was continued.  . Hyperlipidemia   . LBBB (left bundle branch block) 06/2018  . Obstructive sleep apnea     Surgeries: Procedure(s): LEFT TOTAL HIP ARTHROPLASTY ANTERIOR APPROACH on 09/14/2018   Consultants:   Discharged Condition: Improved  Hospital Course: [name] [name] is an 78 y.o. male who was admitted 09/14/2018 for operative treatment ofPrimary osteoarthritis of left hip. Patient has severe unremitting pain that affects sleep, daily activities, and work/hobbies. After pre-op clearance the patient was taken to the operating room on 09/14/2018 and underwent  Procedure(s): LEFT TOTAL HIP ARTHROPLASTY ANTERIOR APPROACH.    Patient was given perioperative antibiotics:  Anti-infectives (From admission, onward)   Start     Dose/Rate Route Frequency Ordered Stop   09/14/18 1600  clindamycin (CLEOCIN) IVPB 600 mg     600 mg 100 mL/hr over 30 Minutes Intravenous Every 6 hours 09/14/18 1308 09/15/18 0010   09/14/18 0745  clindamycin (CLEOCIN) IVPB 900 mg     900 mg 100 mL/hr over 30 Minutes Intravenous On call to O.R. 09/14/18 0733 09/14/18 1018       Patient was given sequential compression devices, early ambulation, and chemoprophylaxis to prevent DVT.  Patient benefited maximally from hospital stay and there were no complications.    Recent vital signs:  Patient Vitals for the  past 24 hrs:  BP Temp Temp src Pulse Resp SpO2  09/16/18 0603 111/69 99.1 F (37.3 C) Oral (!) 110 18 98 %  09/15/18 2110 (!) 143/58 98.6 F (37 C) Oral 84 16 99 %  09/15/18 1454 (!) 115/53 98 F (36.7 C) Oral 73 14 100 %  09/15/18 0935 (!) 106/56 98.3 F (36.8 C) Oral 87 18 99 %     Recent laboratory studies:  Recent Labs    09/15/18 0438  WBC 10.4  HGB 11.0*  HCT 34.5*  PLT 215  NA 138  K 4.2  CL 104  CO2 26  BUN 20  CREATININE 1.00  GLUCOSE 152*  CALCIUM 9.4     Discharge Medications:   Allergies as of 09/16/2018      Reactions   Darifenacin    Other reaction(s): Mental Status Changes (intolerance) PT STATES IT MAKES HIM "BONKERS" AND VERY TIRED   Enablex [darifenacin Hydrobromide Er]    PT STATES IT MAKES HIM "BONKERS" AND VERY TIRED   Ceftriaxone    Unknown reaction   Atorvastatin    Muscle aches/weakness   Ezetimibe    Other reaction(s): Other (See Comments) Muscle aches/weakness   Levaquin [levofloxacin] Diarrhea   Caused diarheer a   Other    Intolerance to zetia and lipitor   Oxybutynin Other (See Comments)   Memory impairment, temperament changes   Sulfamethoxazole    Other reaction(s): Other (See Comments) Childhood allergy   Sulfasalazine    Other reaction(s): Other (See Comments) Unknown per daughter   Sulfonamide  Derivatives       Medication List    STOP taking these medications   aspirin EC 81 MG tablet Replaced by: aspirin 81 MG chewable tablet     TAKE these medications   aspirin 81 MG chewable tablet Chew 1 tablet (81 mg total) by mouth 2 (two) times daily. Replaces: aspirin EC 81 MG tablet   calcium-vitamin D 500-200 MG-UNIT tablet Commonly known as: OSCAL WITH D Take 1 tablet by mouth 2 (two) times daily.   donepezil 10 MG tablet Commonly known as: ARICEPT Take 1 tablet by mouth daily.   Fish Oil 600 MG Caps Take 1 capsule by mouth 2 (two) times daily.   fluticasone 50 MCG/ACT nasal spray Commonly known as:  FLONASE Place 2 sprays into both nostrils daily.   furosemide 20 MG tablet Commonly known as: LASIX Take 20 mg by mouth every other day.   gabapentin 100 MG capsule Commonly known as: NEURONTIN Take 100 mg by mouth at bedtime as needed (pain).   HYDROcodone-acetaminophen 5-325 MG tablet Commonly known as: NORCO/VICODIN Take 1-2 tablets by mouth every 4 (four) hours as needed for moderate pain (pain score 4-6).   lisinopril 20 MG tablet Commonly known as: ZESTRIL Take 20 mg by mouth daily.   METAMUCIL PO Take 5 mLs by mouth daily.   multivitamin tablet Take 1 tablet by mouth daily.   Myrbetriq 25 MG Tb24 tablet Generic drug: mirabegron ER Take 25 mg by mouth daily.   rosuvastatin 10 MG tablet Commonly known as: Crestor Take 0.5 tablets (5 mg total) by mouth every other day. NEEDS APPOINTMENT FOR FUTURE REFILLS What changed: additional instructions   Saw Palmetto 450 MG Caps Take 1 capsule by mouth 2 (two) times daily.   vitamin C 1000 MG tablet Take 1,000 mg by mouth daily.            Durable Medical Equipment  (From admission, onward)         Start     Ordered   09/15/18 0924  For home use only DME 3 n 1  Once     09/15/18 0923   09/14/18 1308  DME Walker rolling  Once    Question:  Patient needs a walker to treat with the following condition  Answer:  Status post total replacement of left hip   09/14/18 1308   09/14/18 1308  DME 3 n 1  Once     09/14/18 1308          Diagnostic Studies: Dg Pelvis Portable  Result Date: 09/14/2018 CLINICAL DATA:  Status post left hip replacement. EXAM: PORTABLE PELVIS 1-2 VIEWS COMPARISON:  None. FINDINGS: The left femoral and acetabular components appear to be well situated. No fracture or dislocation is noted. Expected postoperative changes are seen in the surrounding soft tissues. IMPRESSION: Status post left hip arthroplasty. Electronically Signed   By: Marijo Conception M.D.   On: 09/14/2018 12:42   Dg C-arm 1-60  Min-no Report  Result Date: 09/14/2018 Fluoroscopy was utilized by the requesting physician.  No radiographic interpretation.   Dg Hip Operative Unilat W Or W/o Pelvis Left  Result Date: 09/14/2018 CLINICAL DATA:  Left total hip replacement EXAM: OPERATIVE LEFT HIP WITH PELVIS COMPARISON:  None. FLUOROSCOPY TIME:  Radiation Exposure Index (as provided by the fluoroscopic device): 2.47 mGy If the device does not provide the exposure index: Fluoroscopy Time:  23 seconds Number of Acquired Images:  2 FINDINGS: Left hip replacement is noted in satisfactory  position. No acute bony or soft tissue abnormality is noted. IMPRESSION: Status post left hip replacement. Electronically Signed   By: Inez Catalina M.D.   On: 09/14/2018 11:35    Disposition:     Follow-up Information    Mcarthur Rossetti, MD Follow up in 2 week(s).   Specialty: Orthopedic Surgery Contact information: [place] [address] [phone]        [facility] Follow up.   Specialty: Home Health Services Why: agency will provide home health physical therapy. agency will call you to schedule first visit. Contact information: [address] [place] [address] [phone]            Signed: Erskine Emery 09/16/2018, 9:18 AM

## 2018-09-16 NOTE — Progress Notes (Signed)
Subjective: 2 Days Post-Op Procedure(s) (LRB): LEFT TOTAL HIP ARTHROPLASTY ANTERIOR APPROACH (Left) Patient reports pain as mild.    Objective: Vital signs in last 24 hours: Temp:  [98 F (36.7 C)-99.1 F (37.3 C)] 99.1 F (37.3 C) (07/19 0603) Pulse Rate:  [73-110] 110 (07/19 0603) Resp:  [14-18] 18 (07/19 0603) BP: (106-143)/(53-69) 111/69 (07/19 0603) SpO2:  [98 %-100 %] 98 % (07/19 0603)  Intake/Output from previous day: 07/18 0701 - 07/19 0700 In: 1200 [P.O.:1200] Out: 300 [Urine:300] Intake/Output this shift: Total I/O In: 120 [P.O.:120] Out: -   Recent Labs    09/15/18 0438  HGB 11.0*   Recent Labs    09/15/18 0438  WBC 10.4  RBC 3.55*  HCT 34.5*  PLT 215   Recent Labs    09/15/18 0438  NA 138  K 4.2  CL 104  CO2 26  BUN 20  CREATININE 1.00  GLUCOSE 152*  CALCIUM 9.4   No results for input(s): LABPT, INR in the last 72 hours.  Left lower extremity: Intact pulses distally Incision: dressing C/D/I Compartment soft   Assessment/Plan: 2 Days Post-Op Procedure(s) (LRB): LEFT TOTAL HIP ARTHROPLASTY ANTERIOR APPROACH (Left) Discharge home with home health      [place] 09/16/2018, 9:12 AM

## 2018-09-16 NOTE — Progress Notes (Signed)
Occupational Therapy Treatment Patient Details Name: [name] [name] MRN: [id number] DOB: 09-Jan-1941 Today's Date: 09/16/2018    History of present illness Pt s/p L THR and with hx of CAD, CKD, CHF, and dementia   OT comments  Pt moving well, but has difficulty with following sequencing cues.  Allowed pt to move without cues as he is WBAT.  Assisted with walker distance. When cued, pt takes awhile to process.  Feel he should sponge bathe initially and practice shower or tub transfer in his own environment when HHPT comes to see him Pt needed cues during adl for thoroughness.  Per notes, daughter assists at home.   Follow Up Recommendations  Supervision/Assistance - 24 hour    Equipment Recommendations  3 in 1 bedside commode;Other (comment)    Recommendations for Other Services      Precautions / Restrictions Precautions Precautions: Fall Restrictions LLE Weight Bearing: Weight bearing as tolerated       Mobility Bed Mobility               General bed mobility comments: oob with NT  Transfers                 General transfer comment: standing in bathroom with NT    Balance             Standing balance-Leahy Scale: Poor                             ADL either performed or assessed with clinical judgement   ADL                                         General ADL Comments: initially had planned to review shower or tub transfer with pt.  Feel he should practice with HHPT in his own environment as He will have difficulty generalizing. Would recommend sponge bathing initially     Vision       Perception     Praxis      Cognition Arousal/Alertness: Awake/alert Behavior During Therapy: WFL for tasks assessed/performed Overall Cognitive Status: (history of cognitive impairments)                                 General Comments: Pt had difficulty, extra processing time to follow sequence.  Allowed pt to  step with either leg as he is WBAT.  Multimodal cues given for safety and assist to keep walker at safe distance from him.  He also needed cues during ADL for thoroughness        Exercises     Shoulder Instructions       General Comments      Pertinent Vitals/ Pain       Pain Assessment: Faces Faces Pain Scale: Hurts a little bit Pain Location: L hip Pain Descriptors / Indicators: Aching;Sore Pain Intervention(s): Limited activity within patient's tolerance;Monitored during session;Premedicated before session;Repositioned;Ice applied  Home Living                                          Prior Functioning/Environment              Frequency  Min 2X/week  Progress Toward Goals  OT Goals(current goals can now be found in the care plan section)  Progress towards OT goals: Progressing toward goals     Plan      Co-evaluation                 AM-PAC OT "6 Clicks" Daily Activity     Outcome Measure   Help from another person eating meals?: None Help from another person taking care of personal grooming?: A Little Help from another person toileting, which includes using toliet, bedpan, or urinal?: A Little Help from another person bathing (including washing, rinsing, drying)?: A Little Help from another person to put on and taking off regular upper body clothing?: A Little Help from another person to put on and taking off regular lower body clothing?: A Little 6 Click Score: 19    End of Session    OT Visit Diagnosis: Other abnormalities of gait and mobility (R26.89);Pain Pain - Right/Left: Left Pain - part of body: Hip   Activity Tolerance Patient tolerated treatment well   Patient Left in chair;with call bell/phone within reach;with chair alarm set   Nurse Communication          Time: [phone] OT Time Calculation (min): 21 min  Charges: OT General Charges $OT Visit: 1 Visit OT Treatments $Self Care/Home Management :  8-22 mins  Lesle Chris, OTR/L Acute Rehabilitation Services [phone] [facility] pager [phone] office 09/16/2018   [place] 09/16/2018, 9:52 AM

## 2018-09-16 NOTE — Progress Notes (Signed)
Daughter, Elzie Rings updated on dad's condition. Patient will be discharged tomorrow after physical therapy.   All questions answered.   Will continue to monitor patient at this time.

## 2018-09-17 ENCOUNTER — Encounter (HOSPITAL_COMMUNITY): Payer: Self-pay | Admitting: Orthopaedic Surgery

## 2018-09-17 NOTE — Progress Notes (Signed)
Physical Therapy Treatment Patient Details Name: [name] [name] MRN: [id number] DOB: 04/13/1940 Today's Date: 09/17/2018    History of Present Illness Pt s/p L THR and with hx of CAD, CKD, CHF, and dementia    PT Comments    POD # 3 am session Pt OOB in recliner.  Assisted with amb a greater distance. General Gait Details: Increased time with cues for sequence, posture and position from RW.  Pt with  improved with increased distance ambulated.  "love to walk"    Follow Up Recommendations  Home health PT;Follow surgeon's recommendation for DC plan and follow-up therapies     Equipment Recommendations  None recommended by PT    Recommendations for Other Services       Precautions / Restrictions Precautions Precautions: Fall Restrictions Weight Bearing Restrictions: No LLE Weight Bearing: Weight bearing as tolerated    Mobility  Bed Mobility               General bed mobility comments: OOB in recliner  Transfers Overall transfer level: Needs assistance Equipment used: Rolling walker (2 wheeled) Transfers: Sit to/from Stand Sit to Stand: Supervision;Min guard         General transfer comment: 25% VC's on safety with turns and hand placement with stand to sit  Ambulation/Gait Ambulation/Gait assistance: Supervision;Min guard Gait Distance (Feet): 185 Feet Assistive device: Rolling walker (2 wheeled) Gait Pattern/deviations: Decreased step length - right;Decreased step length - left;Shuffle;Trunk flexed;Step-to pattern;Step-through pattern Gait velocity: decreased   General Gait Details: Increased time with cues for sequence, posture and position from RW.  Pt with  improved with increased distance ambulated.  "love to walk"   Stairs Stairs: (no stairs to enter home)           Wheelchair Mobility    Modified Rankin (Stroke Patients Only)       Balance                                            Cognition Arousal/Alertness:  Awake/alert Behavior During Therapy: WFL for tasks assessed/performed                                   General Comments: required 25% repeat functional VC's to complete task.  Slightly "fuzzy" and pt agreed.      Exercises      General Comments        Pertinent Vitals/Pain Pain Assessment: 0-10 Pain Score: 2  Pain Location: L hip Pain Descriptors / Indicators: Aching;Sore;Tightness Pain Intervention(s): Monitored during session;Premedicated before session;Ice applied    Home Living                      Prior Function            PT Goals (current goals can now be found in the care plan section) Progress towards PT goals: Progressing toward goals    Frequency    7X/week      PT Plan Current plan remains appropriate    Co-evaluation              AM-PAC PT "6 Clicks" Mobility   Outcome Measure  Help needed turning from your back to your side while in a flat bed without using bedrails?: A Little Help needed moving from lying on  your back to sitting on the side of a flat bed without using bedrails?: A Little Help needed moving to and from a bed to a chair (including a wheelchair)?: A Little Help needed standing up from a chair using your arms (e.g., wheelchair or bedside chair)?: A Little Help needed to walk in hospital room?: A Little Help needed climbing 3-5 steps with a railing? : A Lot 6 Click Score: 17    End of Session Equipment Utilized During Treatment: Gait belt Activity Tolerance: Patient tolerated treatment well Patient left: in chair;with call bell/phone within reach;with chair alarm set Nurse Communication: Mobility status PT Visit Diagnosis: Difficulty in walking, not elsewhere classified (R26.2)     Time: 1045-1110 PT Time Calculation (min) (ACUTE ONLY): 25 min  Charges:  $Gait Training: 23-37 mins                     Rica Koyanagi  PTA Acute  Rehabilitation Services Pager      [phone] Office       [phone]

## 2018-09-17 NOTE — Progress Notes (Signed)
Patient ID: [name] [name], male   DOB: [birth year], 78 y.o.   MRN: [id number] Stayed an extra day for therapy due to his mild dementia.  Can be discharged to home today.

## 2018-09-17 NOTE — Discharge Summary (Signed)
Patient ID: [name] [name] MRN: [id number] DOB/AGE: October 23, 1940 78 y.o.  Admit date: 09/14/2018 Discharge date: 09/17/2018  Admission Diagnoses:  Principal Problem:   Primary osteoarthritis of left hip Active Problems:   Status post total replacement of left hip   Discharge Diagnoses:  Same  Past Medical History:  Diagnosis Date  . Alzheimer disease ([place])   . CHF (congestive heart failure) ([place])   . Chronic kidney disease    mild insuffiency  . Coronary artery disease    LHC 4/10: Mid LAD 40-50%, then 70%, OM1 20-30%, EF 65%. Mid LAD FFR 0.89 (not hemodynamically significant). Medical therapy was continued.  . Hyperlipidemia   . LBBB (left bundle branch block) 06/2018  . Obstructive sleep apnea     Surgeries: Procedure(s): LEFT TOTAL HIP ARTHROPLASTY ANTERIOR APPROACH on 09/14/2018   Consultants:   Discharged Condition: Improved  Hospital Course: [name] [name] is an 78 y.o. male who was admitted 09/14/2018 for operative treatment ofPrimary osteoarthritis of left hip. Patient has severe unremitting pain that affects sleep, daily activities, and work/hobbies. After pre-op clearance the patient was taken to the operating room on 09/14/2018 and underwent  Procedure(s): LEFT TOTAL HIP ARTHROPLASTY ANTERIOR APPROACH.    Patient was given perioperative antibiotics:  Anti-infectives (From admission, onward)   Start     Dose/Rate Route Frequency Ordered Stop   09/14/18 1600  clindamycin (CLEOCIN) IVPB 600 mg     600 mg 100 mL/hr over 30 Minutes Intravenous Every 6 hours 09/14/18 1308 09/15/18 0010   09/14/18 0745  clindamycin (CLEOCIN) IVPB 900 mg     900 mg 100 mL/hr over 30 Minutes Intravenous On call to O.R. 09/14/18 0733 09/14/18 1018       Patient was given sequential compression devices, early ambulation, and chemoprophylaxis to prevent DVT.  Patient benefited maximally from hospital stay and there were no complications.    Recent vital signs:  Patient Vitals for the  past 24 hrs:  BP Temp Temp src Pulse Resp SpO2  09/17/18 0532 114/63 97.9 F (36.6 C) Oral 66 18 100 %  09/16/18 2141 123/60 98.8 F (37.1 C) Oral 83 18 100 %  09/16/18 1436 (!) 121/52 98.3 F (36.8 C) - 77 16 98 %     Recent laboratory studies:  Recent Labs    09/15/18 0438  WBC 10.4  HGB 11.0*  HCT 34.5*  PLT 215  NA 138  K 4.2  CL 104  CO2 26  BUN 20  CREATININE 1.00  GLUCOSE 152*  CALCIUM 9.4     Discharge Medications:   Allergies as of 09/17/2018      Reactions   Darifenacin    Other reaction(s): Mental Status Changes (intolerance) PT STATES IT MAKES HIM "BONKERS" AND VERY TIRED   Enablex [darifenacin Hydrobromide Er]    PT STATES IT MAKES HIM "BONKERS" AND VERY TIRED   Ceftriaxone    Unknown reaction   Atorvastatin    Muscle aches/weakness   Ezetimibe    Other reaction(s): Other (See Comments) Muscle aches/weakness   Levaquin [levofloxacin] Diarrhea   Caused diarheer a   Other    Intolerance to zetia and lipitor   Oxybutynin Other (See Comments)   Memory impairment, temperament changes   Sulfamethoxazole    Other reaction(s): Other (See Comments) Childhood allergy   Sulfasalazine    Other reaction(s): Other (See Comments) Unknown per daughter   Sulfonamide Derivatives       Medication List    STOP taking these medications  aspirin EC 81 MG tablet Replaced by: aspirin 81 MG chewable tablet     TAKE these medications   aspirin 81 MG chewable tablet Chew 1 tablet (81 mg total) by mouth 2 (two) times daily. Replaces: aspirin EC 81 MG tablet   calcium-vitamin D 500-200 MG-UNIT tablet Commonly known as: OSCAL WITH D Take 1 tablet by mouth 2 (two) times daily.   donepezil 10 MG tablet Commonly known as: ARICEPT Take 1 tablet by mouth daily.   Fish Oil 600 MG Caps Take 1 capsule by mouth 2 (two) times daily.   fluticasone 50 MCG/ACT nasal spray Commonly known as: FLONASE Place 2 sprays into both nostrils daily.   furosemide 20 MG  tablet Commonly known as: LASIX Take 20 mg by mouth every other day.   gabapentin 100 MG capsule Commonly known as: NEURONTIN Take 100 mg by mouth at bedtime as needed (pain).   HYDROcodone-acetaminophen 5-325 MG tablet Commonly known as: NORCO/VICODIN Take 1-2 tablets by mouth every 4 (four) hours as needed for moderate pain (pain score 4-6).   lisinopril 20 MG tablet Commonly known as: ZESTRIL Take 20 mg by mouth daily.   METAMUCIL PO Take 5 mLs by mouth daily.   multivitamin tablet Take 1 tablet by mouth daily.   Myrbetriq 25 MG Tb24 tablet Generic drug: mirabegron ER Take 25 mg by mouth daily.   rosuvastatin 10 MG tablet Commonly known as: Crestor Take 0.5 tablets (5 mg total) by mouth every other day. NEEDS APPOINTMENT FOR FUTURE REFILLS What changed: additional instructions   Saw Palmetto 450 MG Caps Take 1 capsule by mouth 2 (two) times daily.   vitamin C 1000 MG tablet Take 1,000 mg by mouth daily.            Durable Medical Equipment  (From admission, onward)         Start     Ordered   09/15/18 0924  For home use only DME 3 n 1  Once     09/15/18 0923   09/14/18 1308  DME Walker rolling  Once    Question:  Patient needs a walker to treat with the following condition  Answer:  Status post total replacement of left hip   09/14/18 1308   09/14/18 1308  DME 3 n 1  Once     09/14/18 1308          Diagnostic Studies: Dg Pelvis Portable  Result Date: 09/14/2018 CLINICAL DATA:  Status post left hip replacement. EXAM: PORTABLE PELVIS 1-2 VIEWS COMPARISON:  None. FINDINGS: The left femoral and acetabular components appear to be well situated. No fracture or dislocation is noted. Expected postoperative changes are seen in the surrounding soft tissues. IMPRESSION: Status post left hip arthroplasty. Electronically Signed   By: Marijo Conception M.D.   On: 09/14/2018 12:42   Dg C-arm 1-60 Min-no Report  Result Date: 09/14/2018 Fluoroscopy was utilized by  the requesting physician.  No radiographic interpretation.   Dg Hip Operative Unilat W Or W/o Pelvis Left  Result Date: 09/14/2018 CLINICAL DATA:  Left total hip replacement EXAM: OPERATIVE LEFT HIP WITH PELVIS COMPARISON:  None. FLUOROSCOPY TIME:  Radiation Exposure Index (as provided by the fluoroscopic device): 2.47 mGy If the device does not provide the exposure index: Fluoroscopy Time:  23 seconds Number of Acquired Images:  2 FINDINGS: Left hip replacement is noted in satisfactory position. No acute bony or soft tissue abnormality is noted. IMPRESSION: Status post left hip replacement. Electronically Signed  By: Inez Catalina M.D.   On: 09/14/2018 11:35    Disposition: Discharge disposition: 01-Home or [place]    Mcarthur Rossetti, MD Follow up in 2 week(s).   Specialty: Orthopedic Surgery Contact information: [place] [address] [phone]        Home, [facility] Follow up.   Specialty: Home Health Services Why: agency will provide home health physical therapy. agency will call you to schedule first visit. Contact information: [address] [place]  [address] [phone]            Signed: Mcarthur Rossetti 09/17/2018, 7:17 AM

## 2018-09-17 NOTE — Plan of Care (Signed)
  Problem: Education: Goal: Knowledge of the prescribed therapeutic regimen will improve Outcome: Adequate for Discharge Goal: Understanding of discharge needs will improve Outcome: Adequate for Discharge Goal: Individualized Educational Video(s) Outcome: Adequate for Discharge   Problem: Activity: Goal: Ability to avoid complications of mobility impairment will improve Outcome: Adequate for Discharge Goal: Ability to tolerate increased activity will improve Outcome: Adequate for Discharge   Problem: Clinical Measurements: Goal: Postoperative complications will be avoided or minimized Outcome: Adequate for Discharge   Problem: Pain Management: Goal: Pain level will decrease with appropriate interventions Outcome: Adequate for Discharge   Problem: Skin Integrity: Goal: Will show signs of wound healing Outcome: Adequate for Discharge   Problem: Education: Goal: Knowledge of General Education information will improve Description: Including pain rating scale, medication(s)/side effects and non-pharmacologic comfort measures Outcome: Adequate for Discharge   Problem: Health Behavior/Discharge Planning: Goal: Ability to manage health-related needs will improve Outcome: Adequate for Discharge   Problem: Clinical Measurements: Goal: Ability to maintain clinical measurements within normal limits will improve Outcome: Adequate for Discharge Goal: Will remain free from infection Outcome: Adequate for Discharge Goal: Diagnostic test results will improve Outcome: Adequate for Discharge Goal: Respiratory complications will improve Outcome: Adequate for Discharge Goal: Cardiovascular complication will be avoided Outcome: Adequate for Discharge   Problem: Activity: Goal: Risk for activity intolerance will decrease Outcome: Adequate for Discharge   Problem: Nutrition: Goal: Adequate nutrition will be maintained Outcome: Adequate for Discharge   Problem: Coping: Goal: Level of  anxiety will decrease Outcome: Adequate for Discharge   Problem: Elimination: Goal: Will not experience complications related to bowel motility Outcome: Adequate for Discharge Goal: Will not experience complications related to urinary retention Outcome: Adequate for Discharge   Problem: Pain Managment: Goal: General experience of comfort will improve Outcome: Adequate for Discharge   Problem: Safety: Goal: Ability to remain free from injury will improve Outcome: Adequate for Discharge   Problem: Skin Integrity: Goal: Risk for impaired skin integrity will decrease Outcome: Adequate for Discharge  Discharge teaching done with patient and his daughter (phone).  Written information given.

## 2018-09-18 NOTE — Anesthesia Procedure Notes (Signed)
Spinal  Patient location during procedure: OR Start time: 09/14/2018 10:13 AM End time: 09/14/2018 10:16 AM Staffing Anesthesiologist: Brennan Bailey, MD Resident/CRNA: Noralyn Pick D, CRNA Performed: resident/CRNA  Preanesthetic Checklist Completed: patient identified, surgical consent, pre-op evaluation, timeout performed, IV checked, risks and benefits discussed and monitors and equipment checked Spinal Block Patient position: sitting Prep: site prepped and draped and DuraPrep Patient monitoring: cardiac monitor, continuous pulse ox and blood pressure Approach: midline Location: L3-4 Injection technique: single-shot Needle Needle type: Pencan  Needle gauge: 24 G Needle length: 9 cm Additional Notes Risks, benefits, and alternative discussed. Patient gave consent to procedure. Prepped and draped in sitting position. Clear CSF obtained. Positive terminal aspiration. No pain or paraesthesias with injection. Patient tolerated procedure well. Vital signs stable. Tawny Asal, MD

## 2018-09-18 NOTE — Addendum Note (Signed)
Addendum  created 09/18/18 0835 by Brennan Bailey, MD   Child order released for a procedure order, Clinical Note Signed, Intraprocedure Blocks edited

## 2018-09-20 ENCOUNTER — Telehealth: Payer: Self-pay | Admitting: Orthopaedic Surgery

## 2018-09-20 NOTE — Telephone Encounter (Signed)
Received call from [place] with [facility] needing verbal orders for HHOT 1 Wk 1 and  Wk 4   The number to contact Meliton Rattan is [phone]

## 2018-09-21 NOTE — Telephone Encounter (Signed)
IC verbal given.  

## 2018-09-26 ENCOUNTER — Telehealth: Payer: Self-pay | Admitting: Orthopaedic Surgery

## 2018-09-26 ENCOUNTER — Other Ambulatory Visit: Payer: Self-pay

## 2018-09-26 ENCOUNTER — Telehealth: Payer: Self-pay

## 2018-09-26 DIAGNOSIS — M7989 Other specified soft tissue disorders: Secondary | ICD-10-CM

## 2018-09-26 NOTE — Telephone Encounter (Signed)
This is the patient

## 2018-09-26 NOTE — Telephone Encounter (Signed)
Malroy wants to know if patient can get in the shower. Malroy's callback # [phone]

## 2018-09-26 NOTE — Telephone Encounter (Signed)
LMOM for [name] [name] that we are going to send him for a doppler

## 2018-09-26 NOTE — Telephone Encounter (Signed)
Kasey with [facility] called stating that patient's left leg is tight, red on the front and back, swollen and that his edema is getting worse.  Stated that patient has been elevating and using ice. Left hip surgery on 09/14/2018. Concerned that it may be a blood clot. At this time patient does not have compression hose that fits properly.  CB# is [phone]

## 2018-09-26 NOTE — Telephone Encounter (Signed)
LMOM for her a verbal that he can have shower

## 2018-09-26 NOTE — Telephone Encounter (Signed)
Patient's daughter called again concerning patient;s left leg.

## 2018-09-27 ENCOUNTER — Telehealth: Payer: Self-pay

## 2018-09-27 ENCOUNTER — Encounter: Payer: Self-pay | Admitting: Family

## 2018-09-27 ENCOUNTER — Encounter: Payer: Self-pay | Admitting: Orthopaedic Surgery

## 2018-09-27 ENCOUNTER — Ambulatory Visit (INDEPENDENT_AMBULATORY_CARE_PROVIDER_SITE_OTHER): Payer: Medicare Other | Admitting: Orthopaedic Surgery

## 2018-09-27 ENCOUNTER — Ambulatory Visit (HOSPITAL_COMMUNITY)
Admission: RE | Admit: 2018-09-27 | Discharge: 2018-09-27 | Disposition: A | Discharge status: Home / Self Care | Payer: Medicare Other | Source: Ambulatory Visit | Attending: Orthopaedic Surgery | Admitting: Orthopaedic Surgery

## 2018-09-27 ENCOUNTER — Other Ambulatory Visit: Payer: Self-pay

## 2018-09-27 DIAGNOSIS — Z96642 Presence of left artificial hip joint: Secondary | ICD-10-CM

## 2018-09-27 DIAGNOSIS — M7989 Other specified soft tissue disorders: Secondary | ICD-10-CM

## 2018-09-27 MED ORDER — DOXYCYCLINE HYCLATE 100 MG PO TABS: 100.0000 mg | ORAL_TABLET | Freq: Two times a day (BID) | ORAL | 0 refills | Status: DC

## 2018-09-27 NOTE — Telephone Encounter (Signed)
Pt is scheduled for today at 10am

## 2018-09-27 NOTE — Telephone Encounter (Signed)
FYI- Per Caryl Pina with [facility], No DVT, left leg.

## 2018-09-27 NOTE — Progress Notes (Signed)
The patient is 2 weeks tomorrow status post a left total hip arthroplasty.  His daughter is with him today.  Is 78 years old.  He has had significant lower extremity swelling.  He does have a history of peripheral edema and cellulitis in the past in his legs.  We sent him for Doppler ultrasound yesterday and he was negative for DVT.  He is on aspirin twice a day.  On exam his left hip incision looks good to remove the sutures in place Steri-Strips.  He does have significant pitting edema on his left leg.  I had my partner Dr. Sharol Given take a look at him and he placed him in a compressive sock.  I will put him on some doxycycline just for the cellulitis of the leg and we will see him back in 3 weeks to see how he is doing overall.  All question concerns were answered and addressed.  We will continue decrease his activities as comfort allows and as he tolerates.

## 2018-09-27 NOTE — Telephone Encounter (Signed)
Called Crystal with [facility] [address] and she will contact pt daughter to get pt scheduled for [place]

## 2018-10-03 ENCOUNTER — Telehealth: Payer: Self-pay | Admitting: Orthopaedic Surgery

## 2018-10-03 NOTE — Telephone Encounter (Signed)
Sharyn Lull from [facility] called to get an extension of the patient's  PT.  She is wanting to add 2x a week for 2 weeks due to the patient having a set back with his leg swelling.  CB#[phone].  Thank you.

## 2018-10-04 ENCOUNTER — Telehealth: Payer: Self-pay | Admitting: Orthopaedic Surgery

## 2018-10-04 NOTE — Telephone Encounter (Signed)
Verbal order given  

## 2018-10-04 NOTE — Telephone Encounter (Signed)
Malori from [facility] called to request VO to extend the patient's [facility] OT for 1x a week for 2 weeks.  CB#[phone].  Thank you.

## 2018-10-05 ENCOUNTER — Telehealth: Payer: Self-pay | Admitting: Orthopaedic Surgery

## 2018-10-05 NOTE — Telephone Encounter (Signed)
IC verbal given.  

## 2018-10-05 NOTE — Telephone Encounter (Signed)
Received call from [place] (PT) with [facility] needing an extension for HHPT 2 times a week for 2 weeks. The number to contact Selinda Eon is [phone]

## 2018-10-11 ENCOUNTER — Other Ambulatory Visit: Payer: Self-pay

## 2018-10-11 MED ORDER — ROSUVASTATIN CALCIUM 10 MG PO TABS: 5.0000 mg | ORAL_TABLET | ORAL | 8 refills | Status: DC

## 2018-10-18 ENCOUNTER — Encounter: Payer: Self-pay | Admitting: Orthopaedic Surgery

## 2018-10-18 ENCOUNTER — Ambulatory Visit (INDEPENDENT_AMBULATORY_CARE_PROVIDER_SITE_OTHER): Payer: Medicare Other | Admitting: Orthopaedic Surgery

## 2018-10-18 DIAGNOSIS — Z96642 Presence of left artificial hip joint: Secondary | ICD-10-CM

## 2018-10-18 NOTE — Progress Notes (Signed)
The patient is just past 4 weeks status post a left total hip arthroplasty.  He is 78 years old.  He is ambulating with a cane.  He does report decreased pain as well as increased hip range of motion and strength.  He is walking more each day.  His daughter is with him and said he has been able to walk to the mailbox and they are very pleased overall.  I examined his incision in the left hip incision looks good.  He has good range of motion of his left hip as well.  This point we will see him back one more time in 4 weeks to make sure he is doing well from mobility standpoint.  We would then not need to see him back for 6 months after that.  All question concerns were answered and addressed.  No x-rays are needed at the next visit.

## 2018-10-25 ENCOUNTER — Telehealth: Payer: Self-pay | Admitting: Orthopaedic Surgery

## 2018-10-25 NOTE — Telephone Encounter (Signed)
Patient's daughter Elzie Rings called asked of patient can be referred for out patient therapy at [facility] in [place], [place]  Fax# [phone]

## 2018-10-26 ENCOUNTER — Other Ambulatory Visit: Payer: Self-pay

## 2018-10-26 DIAGNOSIS — Z96642 Presence of left artificial hip joint: Secondary | ICD-10-CM

## 2018-10-29 ENCOUNTER — Telehealth: Payer: Self-pay | Admitting: Orthopaedic Surgery

## 2018-10-29 NOTE — Telephone Encounter (Signed)
Billie from [facility] called to request the referral for PT be faxed to her at [phone].  CB#[phone].  Thank you.

## 2018-10-29 NOTE — Telephone Encounter (Signed)
Faxed order

## 2018-11-15 ENCOUNTER — Encounter: Payer: Self-pay | Admitting: Orthopaedic Surgery

## 2018-11-15 ENCOUNTER — Ambulatory Visit (INDEPENDENT_AMBULATORY_CARE_PROVIDER_SITE_OTHER): Payer: Medicare Other | Admitting: Orthopaedic Surgery

## 2018-11-15 DIAGNOSIS — Z96642 Presence of left artificial hip joint: Secondary | ICD-10-CM

## 2018-11-15 NOTE — Progress Notes (Signed)
The patient is now 72 days status post a left total hip arthroplasty.  He is ambulate with a cane and reports that he is doing well.  He is ready do some yard work he states.  He is 78 years old.  On exam his right hip moves smoothly.  His left operative hip moves like the right hip.  There is no issues with pain and no significant leg length discrepancy at all.  We will continue to increase his activities as he is comfortable.  We will see him back in 6 months unless there is any other issues.  At that visit we will have a standing low AP pelvis and lateral of his left operative hip.

## 2019-03-11 ENCOUNTER — Encounter: Payer: Self-pay | Admitting: Orthopaedic Surgery

## 2019-04-15 ENCOUNTER — Ambulatory Visit: Payer: Medicare Other | Admitting: Internal Medicine

## 2019-04-15 ENCOUNTER — Other Ambulatory Visit: Payer: Self-pay

## 2019-04-15 ENCOUNTER — Encounter: Payer: Self-pay | Admitting: Internal Medicine

## 2019-04-15 VITALS — BP 118/76 | HR 68 | Temp 97.0°F | Ht 69.0 in | Wt 191.0 lb

## 2019-04-15 DIAGNOSIS — I25119 Atherosclerotic heart disease of native coronary artery with unspecified angina pectoris: Secondary | ICD-10-CM | POA: Diagnosis not present

## 2019-04-15 DIAGNOSIS — G4733 Obstructive sleep apnea (adult) (pediatric): Secondary | ICD-10-CM

## 2019-04-15 NOTE — Patient Instructions (Signed)
Order DME [facility]- please continue CPAP auto 5-15, mask of choice, humidifier, supplies, AirView/ card      Please call us if we can help

## 2019-04-15 NOTE — Progress Notes (Signed)
HPI male former smoker followed for OSA, complicated by CAD, CHF, venous stasis dermatitis, chronic renal disease PFT: 12/16/2011-within normal limits. Small airway flows were quite normal but improved with bronchodilator. FEV1 3.03/107%, FEV1/FVC 0.82, DLCO 0.89. NPSG 12/25/91- AHI 19.3/ hr, desat to 66%, body weight 206 lbs ------------------------------------------------------------------------------------------------   03/11/2018- 79 year old male former smoker followed for OSA, complicated by CAD, chronic renal disease, dementia/ Alzheimers CPAP auto 5-15/[employer] ([place]) new machine in past year [id number] Pr. is good,full face mask,sleeps with avg. 5-7 hrs. each night No download available today Body weight today 209 pounds He went with the homecare company that provided the CPAP brand he wanted.  He reports doing well.  Comfortable with pressure and denies concerns. He indicates that his memory continues to decline.  Daughter brought him but chose to wait in the waiting room.  04/15/19-  79 year old male former smoker followed for OSA, complicated by CAD/ CHF, chronic renal disease, dementia/ Alzheimers, GERD with esophagitis, Venous stasis dermatititis,  CPAP auto 5-15/[employer] ([place]) new machine in 2020 [place]  Daughter here with him Had THR L hip in 2020 Body weight today 191 lbs Some DOE, no acute events, little cough or wheeze. Denies cardiac issues. Says he uses CPAP, can't sleep without it.   ROS-see HPI  + = positive Constitutional:   No-   weight loss, night sweats, fevers, chills, fatigue, lassitude. HEENT:   No-  headaches, difficulty swallowing, tooth/dental problems, sore throat,       No-  sneezing, itching, ear ache, nasal congestion, post nasal drip,  CV:  No-   chest pain, orthopnea, PND, swelling in lower extremities, anasarca,                                           dizziness, palpitations Resp: + shortness of breath with exertion or at rest.               No-   productive cough,  No non-productive cough,  No- coughing up of blood.              No-   change in color of mucus.  No- wheezing.   Skin: No-   rash or lesions. GI:   GU:  MS:  No-   joint pain or swelling.   Neuro-     Memory decline- Alzheimers Psych:  No- change in mood or affect. No depression or anxiety.  + memory loss.  OBJ General- Alert, Oriented, Affect-appropriate, Distress- none acute. +Overweight Skin- rash-none, lesions- none, excoriation- none Lymphadenopathy- none Head- atraumatic            Eyes- Gross vision intact, PERRLA, +Strabismus, conjunctivae clear secretions            Ears- +Hearing aids, + scarring both TMs            Nose- Clear, no-Septal dev, mucus, polyps, erosion, perforation             Throat- Mallampati IV , mucosa clear , drainage- none, tonsils- atrophic Neck- flexible , trachea midline, no stridor , thyroid nl, carotid no bruit Chest - symmetrical excursion , unlabored           Heart/CV- RRR , no murmur , no gallop  , no rub, nl s1 s2                           -  JVD- none , edema- none, stasis changes- none, varices- none           Lung- clear to P&A, wheeze- none, cough- none , dullness-none, rub- none           Chest wall-  Abd-  Br/ Gen/ Rectal- Not done, not indicated Extrem- cyanosis- none, clubbing, none, atrophy- none, strength- nl Neuro- grossly intact to observation

## 2019-04-21 NOTE — Assessment & Plan Note (Signed)
He denies acute problems. Followed by cardiology.

## 2019-04-21 NOTE — Assessment & Plan Note (Signed)
Benefits. Says he can't sleep w/o CPAP. Plan- continue auto 5-15

## 2019-05-23 ENCOUNTER — Other Ambulatory Visit: Payer: Self-pay | Admitting: Otolaryngology

## 2019-05-23 DIAGNOSIS — H918X9 Other specified hearing loss, unspecified ear: Secondary | ICD-10-CM

## 2019-06-09 ENCOUNTER — Ambulatory Visit
Admission: RE | Admit: 2019-06-09 | Discharge: 2019-06-09 | Disposition: A | Discharge status: Home / Self Care | Payer: Medicare Other | Source: Ambulatory Visit | Attending: Otolaryngology | Admitting: Otolaryngology

## 2019-06-09 ENCOUNTER — Other Ambulatory Visit: Payer: Self-pay

## 2019-06-09 DIAGNOSIS — H918X9 Other specified hearing loss, unspecified ear: Secondary | ICD-10-CM

## 2019-06-09 MED ORDER — GADOBENATE DIMEGLUMINE 529 MG/ML IV SOLN
20.0000 mL | Freq: Once | INTRAVENOUS | Status: AC | PRN
  Administered 2019-06-09: 09:00:00 20 mL via INTRAVENOUS

## 2019-08-06 ENCOUNTER — Encounter: Payer: Self-pay | Admitting: Physical Therapy

## 2019-08-06 ENCOUNTER — Ambulatory Visit: Payer: Medicare Other | Attending: Family Medicine | Admitting: Physical Therapy

## 2019-08-06 ENCOUNTER — Other Ambulatory Visit: Payer: Self-pay

## 2019-08-06 DIAGNOSIS — R42 Dizziness and giddiness: Secondary | ICD-10-CM

## 2019-08-06 DIAGNOSIS — R2681 Unsteadiness on feet: Secondary | ICD-10-CM

## 2019-08-06 DIAGNOSIS — R2689 Other abnormalities of gait and mobility: Secondary | ICD-10-CM | POA: Diagnosis not present

## 2019-08-07 ENCOUNTER — Encounter: Payer: Self-pay | Admitting: Orthopaedic Surgery

## 2019-08-07 ENCOUNTER — Ambulatory Visit: Payer: Self-pay

## 2019-08-07 ENCOUNTER — Ambulatory Visit: Payer: Medicare Other | Admitting: Orthopaedic Surgery

## 2019-08-07 DIAGNOSIS — M25511 Pain in right shoulder: Secondary | ICD-10-CM

## 2019-08-07 DIAGNOSIS — G8929 Other chronic pain: Secondary | ICD-10-CM

## 2019-08-07 DIAGNOSIS — M19011 Primary osteoarthritis, right shoulder: Secondary | ICD-10-CM

## 2019-08-07 MED ORDER — METHYLPREDNISOLONE ACETATE 40 MG/ML IJ SUSP
40.0000 mg | INTRAMUSCULAR | Status: AC | PRN
  Administered 2019-08-07: 40 mg via INTRA_ARTICULAR

## 2019-08-07 MED ORDER — LIDOCAINE HCL 1 % IJ SOLN
3.0000 mL | INTRAMUSCULAR | Status: AC | PRN
  Administered 2019-08-07: 3 mL

## 2019-08-07 NOTE — Progress Notes (Signed)
Office Visit Note   Patient: [name] [name]           Date of Birth: Jan 13, 1941           MRN: [id number] Visit Date: 08/07/2019              Requested by: Chesley Noon, MD [place],   [id number] PCP: Chesley Noon, MD   Assessment & Plan: Visit Diagnoses:  1. Chronic right shoulder pain   2. Primary osteoarthritis, right shoulder     Plan: It was worth trying a steroid injection today in the subacromial outlet of the right shoulder.  I also feel he is a candidate for an intra-articular steroid injection in the glenohumeral joint under ultrasound.  We will set up an appoint with Dr. Junius Roads to perform an ultrasound-guided injection in his right shoulder joint with a steroid.  I would then like to see him back myself in 4 weeks.  At that visit we need to evaluate his left hip with a standing low AP pelvis and lateral of his left operative hip.  All questions and concerns were answered and addressed.  He did tolerate the steroid injection in the right shoulder subacromial space today.  Follow-Up Instructions: Return in about 4 weeks (around 09/04/2019).   Orders:  Orders Placed This Encounter  Procedures  . Large Joint Inj: R subacromial bursa  . XR Shoulder Right   No orders of the defined types were placed in this encounter.     Procedures: Large Joint Inj: R subacromial bursa on 08/07/2019 3:32 PM Indications: pain and diagnostic evaluation Details: 22 G 1.5 in needle  Arthrogram: No  Medications: 3 mL lidocaine 1 %; 40 mg methylPREDNISolone acetate 40 MG/ML Outcome: tolerated well, no immediate complications Procedure, treatment alternatives, risks and benefits explained, specific risks discussed. Consent was given by the patient. Immediately prior to procedure a time out was called to verify the correct patient, procedure, equipment, support staff and site/side marked as required. Patient was prepped and draped in the usual sterile fashion.        Clinical Data: No additional findings.   Subjective: Chief Complaint  Patient presents with  . Right Shoulder - Pain  The patient is well-known to me.  He is an 79 year old gentleman who does have a history of a left total hip arthroplasty we did in July of last year.  He comes in today with right shoulder pain.  His pain is daily and it does occur worse with certain activities.  He does push-up to get out of chairs using his right and left shoulders.  That does put pressure on the right shoulder.  He does have a remote history of right shoulder arthroscopy around 2008.  It is getting more difficult to perform overhead activities with his right shoulder.  HPI  Review of Systems He currently denies any headache, chest pain, shortness of breath, fever, chills, nausea, vomiting  Objective: Vital Signs: There were no vitals taken for this visit.  Physical Exam He is alert and orient x3 and in no acute distress Ortho Exam Examination of his right shoulder shows limitations with abduction as well as overhead motion and forward flexion.  His internal rotation with adduction is limited.  There is weakness in the rotator cuff.  There is also grinding at the glenohumeral joint. Specialty Comments:  No specialty comments available.  Imaging: XR Shoulder Right  Result Date: 08/07/2019 3 views of the right shoulder  show previous distal clavicle resection.  There is also severe glenohumeral arthritic changes.  The shoulder is well located otherwise.  The humeral head is not high riding.    PMFS History: Patient Active Problem List   Diagnosis Date Noted  . 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  . Primary osteoarthritis of left hip 09/06/2018  . Alzheimer's dementia ([place]) 12/07/2016  . 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  . 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  . Venous stasis dermatitis of left lower extremity 04/07/2016  . Bilateral impacted cerumen 01/18/2016  .  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  . Unsteadiness 01/18/2016  . 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  . Cellulitis of left lower leg   . Left leg swelling   . Cellulitis 08/23/2014  . Chronic congestive heart failure ([place]) 04/29/2014  . Chronic kidney disease, stage II (mild)   . Thrombocytopenia, unspecified ([place]) 07/29/2010  . Vitamin D deficiency 05/19/2010  . Depression, major, recurrent, mild ([place]) 05/19/2010  . Urinary urgency 05/19/2010  . 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 Benign essential hypertension 12/23/2009  . Actinic keratosis 12/23/2009  . Seborrheic keratosis 03/18/2009  . Benign neoplasm 03/18/2009  . Other and unspecified hyperlipidemia 01/18/2008  . Obstructive sleep apnea 01/18/2008  . Coronary atherosclerosis 01/18/2008  . Atherosclerotic heart disease of native coronary artery without angina pectoris 01/18/2008  . 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 Unspecified protein-calorie malnutrition ([place]) 09/27/2006  . Gastro-esophageal reflux disease with esophagitis 09/15/2006  . 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 CAD (coronary artery disease), native coronary artery 05/11/2005   Past Medical History:  Diagnosis Date  . Alzheimer disease ([place])   . CHF (congestive heart failure) ([place])   . Chronic kidney disease    mild insuffiency  . Coronary artery disease    LHC 4/10: Mid LAD 40-50%, then 70%, OM1 20-30%, EF 65%. Mid LAD FFR 0.89 (not hemodynamically significant). Medical therapy was continued.  . Hyperlipidemia   . LBBB (left bundle branch block) 06/2018  . Obstructive sleep apnea     Family History  Problem Relation Age of Onset  . Cancer Mother   . Hypertension Mother   . Other Father        old age  . Hypertension Father     Past Surgical History:  Procedure Laterality Date  . CARDIAC CATHETERIZATION  05/02/2003   single vessel,moderate  stenosis mid LAD  . CARDIAC CATHETERIZATION  06/12/2008   continue med. therapy  . right shoulder surgery    . TOTAL HIP ARTHROPLASTY Left 09/14/2018   Procedure: LEFT TOTAL HIP ARTHROPLASTY ANTERIOR APPROACH;  Surgeon: Mcarthur Rossetti, MD;  Location: [facility];  Service: Orthopedics;  Laterality: Left;   Social History   Occupational History  . Occupation: [employer]  Tobacco Use  . Smoking status: Former Smoker    Years: 40.00    Types: Pipe    Quit date: 02/28/1978    Years since quitting: 41.4  . Smokeless tobacco: Never Used  . Tobacco comment: smoked pipe only   Substance and Sexual Activity  . Alcohol use: No    Alcohol/week: 0.0 standard drinks    Comment: rarely wine  . Drug use: No  . Sexual activity: Not on file

## 2019-08-07 NOTE — Therapy (Signed)
[facility] [address] [place], [place], 01/04/1941 Phone: [phone]   Fax:  [phone]  Physical Therapy Evaluation  Patient Details  Name: [name] [name] MRN: [id number] Date of Birth: 11-12-1940 Referring Provider (PT): Dr. Leta Baptist   Encounter Date: 08/06/2019  PT End of Session - 08/07/19 2003    Visit Number  1    Number of Visits  9    Date for PT Re-Evaluation  09/06/19    Authorization Type  [employer] Medicare    Authorization Time Period  08-05-19 - 10-05-19    PT Start Time  1104    PT Stop Time  1153    PT Time Calculation (min)  49 min    Activity Tolerance  Patient tolerated treatment well    Behavior During Therapy  [facility] for tasks assessed/performed       Past Medical History:  Diagnosis Date  . Alzheimer disease ([place])   . CHF (congestive heart failure) ([place])   . Chronic kidney disease    mild insuffiency  . Coronary artery disease    LHC 4/10: Mid LAD 40-50%, then 70%, OM1 20-30%, EF 65%. Mid LAD FFR 0.89 (not hemodynamically significant). Medical therapy was continued.  . Hyperlipidemia   . LBBB (left bundle branch block) 06/2018  . Obstructive sleep apnea     Past Surgical History:  Procedure Laterality Date  . CARDIAC CATHETERIZATION  05/02/2003   single vessel,moderate stenosis mid LAD  . CARDIAC CATHETERIZATION  06/12/2008   continue med. therapy  . right shoulder surgery    . TOTAL HIP ARTHROPLASTY Left 09/14/2018   Procedure: LEFT TOTAL HIP ARTHROPLASTY ANTERIOR APPROACH;  Surgeon: Mcarthur Rossetti, MD;  Location: [facility];  Service: Orthopedics;  Laterality: Left;    There were no vitals filed for this visit.   Subjective Assessment - 08/07/19 1950    Subjective  Pt accompanied to eval by his daughter, Elzie Rings, who provides history for pt - pt amb. with slow gait with use of SPC; daughter states pt is having increased dizziness over past 6 months which makes him unsteady and has  resulted in LOB, but no falls.  She states she is afraid he is going to lose his balance and sustain a fall - wants to decrease the dizziness so that he is safer.    Patient is accompained by:  Family member   daughter, Elzie Rings   Pertinent History  CKD, chronic CHF, Alzheimers, s/p Lt THR    Patient Stated Goals  improve balance, decrease the dizziness    Currently in Pain?  No/denies         [facility] PT Assessment - 08/07/19 0001      Assessment   Medical Diagnosis  Vertigo    Referring Provider (PT)  Dr. Leta Baptist    Onset Date/Surgical Date  --   approx. Jan. 2021     Precautions   Precautions  Fall      Balance Screen   Has the patient fallen in the past 6 months  No    Has the patient had a decrease in activity level because of a fear of falling?   Yes    Is the patient reluctant to leave their home because of a fear of falling?   Yes      [place] residence    Type of [place]  Other (Comment)  basement     Prior Function   Level of Independence  Independent with basic ADLs;Independent with household mobility with device      ROM / Strength   AROM / PROM / Strength  Strength      Strength   Overall Strength  Within functional limits for tasks performed      Ambulation/Gait   Ambulation/Gait  Yes    Ambulation/Gait Assistance  5: Supervision    Ambulation Distance (Feet)  75 Feet    Assistive device  Straight cane    Gait Pattern  Step-through pattern    Ambulation Surface  Level;Indoor    Gait velocity  17.75 secs =1.85 ft/sec      Standardized Balance Assessment   Standardized Balance Assessment  Timed Up and Go Test      Timed Up and Go Test   Normal TUG (seconds)  27.63   with [facility]            Vestibular Assessment - 08/07/19 0001      Symptom Behavior   Type of Dizziness   Diplopia;Imbalance;Unsteady with head/body turns;Lightheadedness    Frequency of Dizziness  multiple daily  occurrences of 2-3 secs    Duration of Dizziness  few seconds    Symptom Nature  Spontaneous;Motion provoked;Variable    Aggravating Factors  Turning body quickly;Turning head quickly;Spontaneous onset    Relieving Factors  Rest;Comments   stop the movement   Progression of Symptoms  Worse      Oculomotor Exam   Oculomotor Alignment  Abnormal   Rt eye lazy eye ( abducted)   Smooth Pursuits  Intact      Oculomotor Exam-Fixation Suppressed    Ocular Alignment  abnormal          Objective measurements completed on examination: See above findings.              PT Education - 08/07/19 1959    Education Details  eval results - dizziness is likely multi-factorial in etiology; instructed in sit to stand exercise for HEP    Person(s) Educated  Patient;Spouse    Methods  Explanation;Demonstration    Comprehension  Verbalized understanding;Returned demonstration       PT Short Term Goals - 08/07/19 2015      PT SHORT TERM GOAL #1   Title  same as LTG's        PT Long Term Goals - 08/07/19 2016      PT LONG TERM GOAL #1   Title  Improve TUG score from 27.63 sec to </= 17 secs with use of SPC.    Baseline  27.63 secs with SPC    Time  4    Period  Weeks    Status  New    Target Date  09/06/19      PT LONG TERM GOAL #2   Title  Increase gait velocity from 1.85 ft/sec to >/= 2.2 ft/sec with SPC for increased gait efficiency.    Baseline  17.75 secs    Target Date  09/06/19      PT LONG TERM GOAL #3   Title  Pt will increase Berg balance test score by at least 5 points for reduced fall risk.    Baseline  TBA    Time  4    Period  Weeks    Status  New    Target Date  09/06/19      PT LONG TERM GOAL #4   Title  Pt  will report at least 25% improvement in dizziness.    Time  4    Period  Weeks    Status  New    Target Date  09/06/19      PT LONG TERM GOAL #5   Title  Independent in HEP for balance exercises.    Time  4    Period  Weeks    Status  New     Target Date  09/06/19             Plan - 08/07/19 2005    Clinical Impression Statement  Pt is an 79 yr old gentleman with c/o dizziness with quick movements, imbalance, unsteady gait and Alzheimer's.  Dizziness appears to be multi-factorial in etiology.  Pt is at very high risk for fall per TUG score of 27.63 secs.  Recommendation has been made to pt and daughter that he use a walker when ambulating outdoors, as she states he walks outside by himself with use of cane.  Orthostatic hypotension may contribute to dizziness etiology as pt reports light-headedness with sit to stand and with supine to sit transfers.    Personal Factors and Comorbidities  Age;Fitness;Comorbidity 2;Transportation;Other   Alzheimer's   Comorbidities  CAD, CHF, Lt THR, Alzheimer's, dizziness    Examination-Activity Limitations  Locomotion Level;Transfers;Bend;Stand;Stairs;Squat    Examination-Participation Restrictions  Meal Prep;Cleaning;Medication Management;Community Activity    Stability/Clinical Decision Making  Evolving/Moderate complexity    Clinical Decision Making  Moderate    Rehab Potential  Good    PT Frequency  2x / week    PT Duration  4 weeks   based on progress - may renew if progress is made   PT Treatment/Interventions  ADLs/Self Care Home Management;Vestibular;Therapeutic exercise;Balance training;Neuromuscular re-education;Therapeutic activities;Functional mobility training;Gait training;Stair training;Patient/family education    PT Next Visit Plan  do Berg, balance HEP    PT Home Exercise Plan  sit to stand    Consulted and Agree with Plan of Care  Patient;Family member/caregiver    Family Member Consulted  daughter Elzie Rings       Patient will benefit from skilled therapeutic intervention in order to improve the following deficits and impairments:  Dizziness, Decreased balance, Decreased strength, Postural dysfunction, Difficulty walking, Decreased safety awareness  Visit  Diagnosis: Other abnormalities of gait and mobility - Plan: PT plan of care cert/re-cert  Dizziness and giddiness - Plan: PT plan of care cert/re-cert  Unsteadiness on feet - Plan: PT plan of care cert/re-cert     Problem List Patient Active Problem List   Diagnosis Date Noted  . 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  . Primary osteoarthritis of left hip 09/06/2018  . Alzheimer's dementia ([place]) 12/07/2016  . 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  . 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  . Venous stasis dermatitis of left lower extremity 04/07/2016  . Bilateral impacted cerumen 01/18/2016  . 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  . Unsteadiness 01/18/2016  . 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  . Cellulitis of left lower leg   . Left leg swelling   . Cellulitis 08/23/2014  . Chronic congestive heart failure ([place]) 04/29/2014  . Chronic kidney disease, stage II (mild)   . Thrombocytopenia, unspecified ([place]) 07/29/2010  . Vitamin D deficiency 05/19/2010  . Depression, major, recurrent, mild ([place]) 05/19/2010  . Urinary urgency 05/19/2010  . 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 Benign essential hypertension 12/23/2009  . Actinic keratosis 12/23/2009  . Seborrheic keratosis 03/18/2009  .  Benign neoplasm 03/18/2009  . Other and unspecified hyperlipidemia 01/18/2008  . Obstructive sleep apnea 01/18/2008  . Coronary atherosclerosis 01/18/2008  . Atherosclerotic heart disease of native coronary artery without angina pectoris 01/18/2008  . 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 Unspecified protein-calorie malnutrition ([place]) 09/27/2006  . Gastro-esophageal reflux disease with esophagitis 09/15/2006  . 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 CAD (coronary artery disease), native coronary artery 05/11/2005    Alianna Wurster, [place], PT 08/07/2019, 8:27 PM  [facility] [address] [place], [place], [address] Phone: [phone]   Fax:  [phone]  Name: [name] [name] MRN: [id number] Date of Birth: [birth year]

## 2019-08-08 ENCOUNTER — Other Ambulatory Visit: Payer: Self-pay

## 2019-08-08 ENCOUNTER — Ambulatory Visit: Payer: Medicare Other | Admitting: Physical Therapy

## 2019-08-08 ENCOUNTER — Encounter: Payer: Self-pay | Admitting: Physical Therapy

## 2019-08-08 DIAGNOSIS — R2689 Other abnormalities of gait and mobility: Secondary | ICD-10-CM | POA: Diagnosis not present

## 2019-08-08 DIAGNOSIS — R2681 Unsteadiness on feet: Secondary | ICD-10-CM

## 2019-08-08 NOTE — Patient Instructions (Signed)
Standing Marching   Using a chair if necessary, march in place. Repeat 10 times. Do 1 session per day.  http://[email or web address].exer.us/344   Hip Backward Kick   Using a chair for balance, keep legs shoulder width apart and toes pointed for- ward. Slowly extend one leg back, keeping knee straight. Do not lean forward. Repeat with other leg. Repeat 10 times. Do 1 sessions per day.  http://[email or web address].exer.us/340    Hip Side Kick   Holding a chair for balance, keep legs shoulder width apart and toes pointed forward. Swing a leg out to side, keeping knee straight. Do not lean. Repeat using other leg. Repeat 10 times. Do 1 session per day.   ALSO DO FORWARD KICKS - 10 reps - alternating legs - hold onto counter as needed  http://[email or web address].exer.us/342    Feet Heel-Toe "Tandem", Varied Arm Positions - Eyes Open - PARTIAL HEEL TO TOE (no arm positions)   With eyes open, right foot directly in front of the other, arms out, look straight ahead at a stationary object. Hold 30 seconds. Repeat 1 times per session. Do 1 sessions per day. DO EACH FOOT POSITION      Standing On One Leg Without Support .  Stand on one leg in neutral spine without support. Hold 10 seconds. Repeat on other leg. Do 2 repetitions, 1 sets.  http://bt.exer.us/36   Copyright  VHI. All rights reserved.    Side-Stepping   Walk to left side with eyes open. Take even steps, leading with same foot. Make sure each foot lifts off the floor. Repeat in opposite direction. Do 1 sessions per day. Do 2-3 times along counter   Functional Quadriceps: Sit to Stand    Sit on edge of chair, feet flat on floor. Stand upright, extending knees fully. Repeat __10__ times per set. Do __1_ sets per session. Do __1__ sessions per day.  http://orth.exer.us/734

## 2019-08-09 NOTE — Therapy (Signed)
[facility] [address] [place], [place], [address] Phone: [phone]   Fax:  [phone]  Physical Therapy Treatment  Patient Details  Name: [name] [name] MRN: [id number] Date of Birth: Dec 28, 1940 Referring Provider (PT): Dr. Leta Baptist   Encounter Date: 08/08/2019   PT End of Session - 08/09/19 1202    Visit Number 2    Number of Visits 9    Date for PT Re-Evaluation 09/06/19    Authorization Type UHC Medicare    Authorization Time Period 08-05-19 - 10-05-19    PT Start Time 1231    PT Stop Time 1317    PT Time Calculation (min) 46 min    Activity Tolerance Patient tolerated treatment well    Behavior During Therapy [facility] for tasks assessed/performed           Past Medical History:  Diagnosis Date  . Alzheimer disease ([place])   . CHF (congestive heart failure) ([place])   . Chronic kidney disease    mild insuffiency  . Coronary artery disease    LHC 4/10: Mid LAD 40-50%, then 70%, OM1 20-30%, EF 65%. Mid LAD FFR 0.89 (not hemodynamically significant). Medical therapy was continued.  . Hyperlipidemia   . LBBB (left bundle branch block) 06/2018  . Obstructive sleep apnea     Past Surgical History:  Procedure Laterality Date  . CARDIAC CATHETERIZATION  05/02/2003   single vessel,moderate stenosis mid LAD  . CARDIAC CATHETERIZATION  06/12/2008   continue med. therapy  . right shoulder surgery    . TOTAL HIP ARTHROPLASTY Left 09/14/2018   Procedure: LEFT TOTAL HIP ARTHROPLASTY ANTERIOR APPROACH;  Surgeon: Mcarthur Rossetti, MD;  Location: [facility];  Service: Orthopedics;  Laterality: Left;    There were no vitals filed for this visit.   Subjective Assessment - 08/08/19 1238    Subjective Pt states he did sit to stand exercise 2-3 times yesterday - states he was able to do it without using hands; pt states he is not having any dizziness today    Patient is accompained by: Family member   daughter, Elzie Rings    Pertinent History CKD, chronic CHF, Alzheimers, s/p Lt THR    Patient Stated Goals improve balance, decrease the dizziness                             [facility] Adult PT Treatment/Exercise - 08/09/19 0001      Transfers   Transfers Sit to Stand    Five time sit to stand comments  19.00 secs without UE support      Standardized Balance Assessment   Standardized Balance Assessment Berg Balance Test      Berg Balance Test   Sit to Stand Able to stand without using hands and stabilize independently    Standing Unsupported Able to stand safely 2 minutes    Sitting with Back Unsupported but Feet Supported on Floor or Stool Able to sit safely and securely 2 minutes    Stand to Sit Sits safely with minimal use of hands    Transfers Able to transfer safely, minor use of hands    Standing Unsupported with Eyes Closed Able to stand 10 seconds with supervision    Standing Ubsupported with Feet Together Able to place feet together independently and stand for 1 minute with supervision    From Standing, Reach Forward with Outstretched Arm Can reach forward >12 cm safely (5")  From Standing Position, Pick up Object from [place] to pick up shoe, needs supervision    From Standing Position, Turn to Look Behind Over each Shoulder Turn sideways only but maintains balance    Turn 360 Degrees Needs close supervision or verbal cueing   Rt =8.16  Lt=7.5   Standing Unsupported, Alternately Place Feet on Step/Stool Able to complete >2 steps/needs minimal assist    Standing Unsupported, One Foot in Front Able to plae foot ahead of the other independently and hold 30 seconds    Standing on One Leg Tries to lift leg/unable to hold 3 seconds but remains standing independently    Total Score 40             Pt instructed in balance HEP - pt performed exs. At counter with UE support (see pt instructions)  Forward, back and side kicks 10 reps each Marching in place 10 reps each; marching  forwards along counter; Sidestepping along counter Partial tandem stance and SLS        PT Education - 08/09/19 1201    Education Details pt instructed in balance HEP - daughter present for instruction    Person(s) Educated Patient    Methods Explanation;Demonstration;Handout    Comprehension Verbalized understanding;Returned demonstration            PT Short Term Goals - 08/07/19 2015      PT SHORT TERM GOAL #1   Title same as LTG's             PT Long Term Goals - 08/09/19 1209      PT LONG TERM GOAL #1   Title Improve TUG score from 27.63 sec to </= 17 secs with use of SPC.    Baseline 27.63 secs with SPC    Time 4    Period Weeks    Status New      PT LONG TERM GOAL #2   Title Increase gait velocity from 1.85 ft/sec to >/= 2.2 ft/sec with SPC for increased gait efficiency.    Baseline 17.75 secs      PT LONG TERM GOAL #3   Title Pt will increase Berg balance test score by at least 5 points for reduced fall risk.    Baseline TBA; Berg score 40/56 on 08-08-19    Time 4    Period Weeks    Status New      PT LONG TERM GOAL #4   Title Pt will report at least 25% improvement in dizziness.    Time 4    Period Weeks    Status New      PT LONG TERM GOAL #5   Title Independent in HEP for balance exercises.    Time 4    Period Weeks    Status New                 Plan - 08/09/19 1203    Clinical Impression Statement Pt reported no dizziness during today's session; pt is at moderate fall risk per Berg balance test score of 40/56.  Pt needs at least 1 hand support for safety with standing balance exercises, due to balance deficits.    Personal Factors and Comorbidities Age;Fitness;Comorbidity 2;Transportation;Other   Alzheimer's   Comorbidities CAD, CHF, Lt THR, Alzheimer's, dizziness    Examination-Activity Limitations Locomotion Level;Transfers;Bend;Stand;Stairs;Squat    Examination-Participation Restrictions Meal Prep;Cleaning;Medication  Management;Community Activity    Stability/Clinical Decision Making Evolving/Moderate complexity    Rehab Potential Good    PT  Frequency 2x / week    PT Duration 4 weeks   based on progress - may renew if progress is made   PT Treatment/Interventions ADLs/Self Care Home Management;Vestibular;Therapeutic exercise;Balance training;Neuromuscular re-education;Therapeutic activities;Functional mobility training;Gait training;Stair training;Patient/family education    PT Next Visit Plan check balance HEP for any questions/problems; cont with balance training, gait training - trial rollator outdoors    PT Home Exercise Plan sit to stand & balance HEP issued on 08-08-19    Consulted and Agree with Plan of Care Patient;Family member/caregiver    Family Member Consulted daughter Elzie Rings           Patient will benefit from skilled therapeutic intervention in order to improve the following deficits and impairments:  Dizziness, Decreased balance, Decreased strength, Postural dysfunction, Difficulty walking, Decreased safety awareness  Visit Diagnosis: Other abnormalities of gait and mobility  Unsteadiness on feet     Problem List Patient Active Problem List   Diagnosis Date Noted  . 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  . Primary osteoarthritis of left hip 09/06/2018  . Alzheimer's dementia ([place]) 12/07/2016  . 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  . 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  . Venous stasis dermatitis of left lower extremity 04/07/2016  . Bilateral impacted cerumen 01/18/2016  . 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  . Unsteadiness 01/18/2016  . 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  . Cellulitis of left lower leg   . Left leg swelling   . Cellulitis 08/23/2014  . Chronic congestive heart failure ([place]) 04/29/2014  . Chronic kidney disease, stage II (mild)   . Thrombocytopenia, unspecified ([place]) 07/29/2010  . Vitamin D deficiency  05/19/2010  . Depression, major, recurrent, mild ([place]) 05/19/2010  . Urinary urgency 05/19/2010  . 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 Benign essential hypertension 12/23/2009  . Actinic keratosis 12/23/2009  . Seborrheic keratosis 03/18/2009  . Benign neoplasm 03/18/2009  . Other and unspecified hyperlipidemia 01/18/2008  . Obstructive sleep apnea 01/18/2008  . Coronary atherosclerosis 01/18/2008  . Atherosclerotic heart disease of native coronary artery without angina pectoris 01/18/2008  . 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 Unspecified protein-calorie malnutrition ([place]) 09/27/2006  . Gastro-esophageal reflux disease with esophagitis 09/15/2006  . 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 CAD (coronary artery disease), native coronary artery 05/11/2005    Claressa Hughley, [place], PT 08/09/2019, 12:11 PM  [facility] [address] [place], [place], [address] Phone: [phone]   Fax:  [phone]  Name: [name] [name] MRN: [id number] Date of Birth: 20-Jun-1940

## 2019-08-12 ENCOUNTER — Ambulatory Visit: Payer: Medicare Other | Admitting: Physical Therapy

## 2019-08-13 ENCOUNTER — Ambulatory Visit: Payer: Medicare Other | Admitting: Physical Therapy

## 2019-08-13 ENCOUNTER — Ambulatory Visit: Payer: Medicare Other

## 2019-08-13 ENCOUNTER — Other Ambulatory Visit: Payer: Self-pay

## 2019-08-13 DIAGNOSIS — R2689 Other abnormalities of gait and mobility: Secondary | ICD-10-CM | POA: Diagnosis not present

## 2019-08-13 DIAGNOSIS — R42 Dizziness and giddiness: Secondary | ICD-10-CM

## 2019-08-13 DIAGNOSIS — R2681 Unsteadiness on feet: Secondary | ICD-10-CM

## 2019-08-13 NOTE — Therapy (Signed)
[facility] [address] [place], [place], [address] Phone: [phone]   Fax:  [phone]  Physical Therapy Treatment  Patient Details  Name: [name] [name] MRN: [id number] Date of Birth: 1940-06-01 Referring Provider (PT): Dr. Leta Baptist   Encounter Date: 08/13/2019   PT End of Session - 08/13/19 1743    Visit Number 3    Number of Visits 9    Date for PT Re-Evaluation 09/06/19    Authorization Type UHC Medicare    Authorization Time Period 08-05-19 - 10-05-19    PT Start Time 1744    PT Stop Time 1829    PT Time Calculation (min) 45 min    Equipment Utilized During Treatment Gait belt    Activity Tolerance Patient tolerated treatment well    Behavior During Therapy [facility] for tasks assessed/performed           Past Medical History:  Diagnosis Date  . Alzheimer disease ([place])   . CHF (congestive heart failure) ([place])   . Chronic kidney disease    mild insuffiency  . Coronary artery disease    LHC 4/10: Mid LAD 40-50%, then 70%, OM1 20-30%, EF 65%. Mid LAD FFR 0.89 (not hemodynamically significant). Medical therapy was continued.  . Hyperlipidemia   . LBBB (left bundle branch block) 06/2018  . Obstructive sleep apnea     Past Surgical History:  Procedure Laterality Date  . CARDIAC CATHETERIZATION  05/02/2003   single vessel,moderate stenosis mid LAD  . CARDIAC CATHETERIZATION  06/12/2008   continue med. therapy  . right shoulder surgery    . TOTAL HIP ARTHROPLASTY Left 09/14/2018   Procedure: LEFT TOTAL HIP ARTHROPLASTY ANTERIOR APPROACH;  Surgeon: Mcarthur Rossetti, MD;  Location: [facility];  Service: Orthopedics;  Laterality: Left;    There were no vitals filed for this visit.   Subjective Assessment - 08/13/19 1745    Subjective Patient reports no new complaints. Patient reports dizziness is coming/going still, no reports of dizziness currently. Reports balance HEP is going well.    Patient is accompained  by: Family member   daughter, [name] [name]   Pertinent History CKD, chronic CHF, Alzheimers, s/p Lt THR    Patient Stated Goals improve balance, decrease the dizziness    Currently in Pain? No/denies                             [facility] Adult PT Treatment/Exercise - 08/13/19 0001      Ambulation/Gait   Ambulation/Gait Yes    Ambulation/Gait Assistance 5: Supervision    Ambulation/Gait Assistance Details completed gait training on outdoor surfaces including pavement, with SPC and Supv. Attempted to complete gait training with rollator on outdoor surfaces, pt reporting not want to attempt gait training with rollator, patient feels comfortable ambulating with SPC at this time.      Ambulation Distance (Feet) 600 Feet    Assistive device Straight cane    Gait Pattern Step-through pattern    Ambulation Surface Unlevel;Outdoor;Paved      High Level Balance   High Level Balance Activities Tandem walking;Marching forwards    High Level Balance Comments In // bars: completed forward marching x 5 laps, focused on slow pace to promote SLS. Completed tandem walking x 4 laps, PT providing verbal cues for proper heel toe pattern for completion. All completed with intermittent UE support.  Balance Exercises - 08/13/19 1826      Balance Exercises: Standing   Standing Eyes Opened Head turns;Wide (BOA);Foam/compliant surface;Other reps (comment);Limitations    Standing Eyes Opened Limitations Completed feet together eyes open x 30 seconds on firm surface. Also completed feet together on foam with horizontal/vertical head turns 2 x 10 reps each.     Standing Eyes Closed Wide (BOA);Foam/compliant surface;Narrow base of support (BOS);Limitations    Standing Eyes Closed Limitations Completed eyes closed with wide BOS initially, 2 x 30 seconds. Progressed to feet together with eyes closed 3 x 30 seconds.     Balance Beam Standing across balance beam, focused on holding steady 3  x 1 min each. Increased sway noted with completion. Standing on balance beam, completed alternating toe taps to 4" step. COmpleted initially with 2 UE support, progressed to 1 UE, and then finsihed with no UE support. Overall patient require increase CGA when completing without UE support but able to maintain balance and complete appropriately. Also standing on balance beam, completed forward step off balance beam and then back to neutral, x 10 reps.     Other Standing Exercises Comments Pt overall demonstrate decreased balance with activiies with vision removed and on complaint surfaces. All balance activites completed in // bars and with CGA as needed.                PT Short Term Goals - 08/07/19 2015      PT SHORT TERM GOAL #1   Title same as LTG's             PT Long Term Goals - 08/09/19 1209      PT LONG TERM GOAL #1   Title Improve TUG score from 27.63 sec to </= 17 secs with use of SPC.    Baseline 27.63 secs with SPC    Time 4    Period Weeks    Status New      PT LONG TERM GOAL #2   Title Increase gait velocity from 1.85 ft/sec to >/= 2.2 ft/sec with SPC for increased gait efficiency.    Baseline 17.75 secs      PT LONG TERM GOAL #3   Title Pt will increase Berg balance test score by at least 5 points for reduced fall risk.    Baseline TBA; Berg score 40/56 on 08-08-19    Time 4    Period Weeks    Status New      PT LONG TERM GOAL #4   Title Pt will report at least 25% improvement in dizziness.    Time 4    Period Weeks    Status New      PT LONG TERM GOAL #5   Title Independent in HEP for balance exercises.    Time 4    Period Weeks    Status New                 Plan - 08/13/19 2100    Clinical Impression Statement Today's skilled PT session focused on gait training on outdoor unlevel surfaces. Patient reporting that he did not want to complete gait training with rollator as he feels [facility] provides him with enough stability. All gait training  completed with SPC at supervision level. Progressed balance actvities as tolerated by patient. Patient demo decreased balance with vision removed and on complaint surfaces at this time.    Personal Factors and Comorbidities Age;Fitness;Comorbidity 2;Transportation;Other   Alzheimer's   Comorbidities CAD, CHF, Lt  THR, Alzheimer's, dizziness    Examination-Activity Limitations Locomotion Level;Transfers;Bend;Stand;Stairs;Squat    Examination-Participation Restrictions Meal Prep;Cleaning;Medication Management;Community Activity    Stability/Clinical Decision Making Evolving/Moderate complexity    Rehab Potential Good    PT Frequency 2x / week    PT Duration 4 weeks   based on progress - may renew if progress is made   PT Treatment/Interventions ADLs/Self Care Home Management;Vestibular;Therapeutic exercise;Balance training;Neuromuscular re-education;Therapeutic activities;Functional mobility training;Gait training;Stair training;Patient/family education    PT Next Visit Plan Continue balance training (add/update to HEP as needed). Dual Tasking, Dynamic Gait    PT Home Exercise Plan sit to stand & balance HEP issued on 08-08-19    Consulted and Agree with Plan of Care Patient;Family member/caregiver    Family Member Consulted daughter [name] [name]           Patient will benefit from skilled therapeutic intervention in order to improve the following deficits and impairments:  Dizziness, Decreased balance, Decreased strength, Postural dysfunction, Difficulty walking, Decreased safety awareness  Visit Diagnosis: Other abnormalities of gait and mobility  Unsteadiness on feet  Dizziness and giddiness     Problem List Patient Active Problem List   Diagnosis Date Noted  . 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  . Primary osteoarthritis of left hip 09/06/2018  . Alzheimer's dementia ([place]) 12/07/2016  . 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  . 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  .  Venous stasis dermatitis of left lower extremity 04/07/2016  . Bilateral impacted cerumen 01/18/2016  . 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  . Unsteadiness 01/18/2016  . 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  . Cellulitis of left lower leg   . Left leg swelling   . Cellulitis 08/23/2014  . Chronic congestive heart failure ([place]) 04/29/2014  . Chronic kidney disease, stage II (mild)   . Thrombocytopenia, unspecified ([place]) 07/29/2010  . Vitamin D deficiency 05/19/2010  . Depression, major, recurrent, mild ([place]) 05/19/2010  . Urinary urgency 05/19/2010  . 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 Benign essential hypertension 12/23/2009  . Actinic keratosis 12/23/2009  . Seborrheic keratosis 03/18/2009  . Benign neoplasm 03/18/2009  . Other and unspecified hyperlipidemia 01/18/2008  . Obstructive sleep apnea 01/18/2008  . Coronary atherosclerosis 01/18/2008  . Atherosclerotic heart disease of native coronary artery without angina pectoris 01/18/2008  . 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 Unspecified protein-calorie malnutrition ([place]) 09/27/2006  . Gastro-esophageal reflux disease with esophagitis 09/15/2006  . 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 CAD (coronary artery disease), native coronary artery 05/11/2005    Jones Bales, PT, DPT 08/13/2019, 9:03 PM  [facility] [address] [place], [place], [address] Phone: [phone]   Fax:  [phone]  Name: [name] [name] MRN: [id number] Date of Birth: Jul 24, 1940

## 2019-08-14 ENCOUNTER — Ambulatory Visit: Payer: Medicare Other

## 2019-08-15 ENCOUNTER — Ambulatory Visit: Payer: Medicare Other | Admitting: Physical Therapy

## 2019-08-15 ENCOUNTER — Other Ambulatory Visit: Payer: Self-pay

## 2019-08-15 DIAGNOSIS — R2681 Unsteadiness on feet: Secondary | ICD-10-CM

## 2019-08-15 DIAGNOSIS — R2689 Other abnormalities of gait and mobility: Secondary | ICD-10-CM

## 2019-08-15 NOTE — Therapy (Signed)
[facility] [address] [place], [place], [address] Phone: [phone]   Fax:  [phone]  Physical Therapy Treatment  Patient Details  Name: [name] [name] MRN: [id number] Date of Birth: Nov 23, 1940 Referring Provider (PT): Dr. Leta Baptist   Encounter Date: 08/15/2019   PT End of Session - 08/15/19 0845    Visit Number 4    Number of Visits 9    Date for PT Re-Evaluation 09/06/19    Authorization Type UHC Medicare    Authorization Time Period 08-05-19 - 10-05-19    PT Start Time 0847    PT Stop Time 0929    PT Time Calculation (min) 42 min    Equipment Utilized During Treatment Gait belt    Activity Tolerance Patient tolerated treatment well    Behavior During Therapy [facility] for tasks assessed/performed           Past Medical History:  Diagnosis Date  . Alzheimer disease ([place])   . CHF (congestive heart failure) ([place])   . Chronic kidney disease    mild insuffiency  . Coronary artery disease    LHC 4/10: Mid LAD 40-50%, then 70%, OM1 20-30%, EF 65%. Mid LAD FFR 0.89 (not hemodynamically significant). Medical therapy was continued.  . Hyperlipidemia   . LBBB (left bundle branch block) 06/2018  . Obstructive sleep apnea     Past Surgical History:  Procedure Laterality Date  . CARDIAC CATHETERIZATION  05/02/2003   single vessel,moderate stenosis mid LAD  . CARDIAC CATHETERIZATION  06/12/2008   continue med. therapy  . right shoulder surgery    . TOTAL HIP ARTHROPLASTY Left 09/14/2018   Procedure: LEFT TOTAL HIP ARTHROPLASTY ANTERIOR APPROACH;  Surgeon: Mcarthur Rossetti, MD;  Location: [facility];  Service: Orthopedics;  Laterality: Left;    There were no vitals filed for this visit.   Subjective Assessment - 08/15/19 0847    Subjective No falls, no stumbles.  Not as much dizziness lately.  Per daughter, did some good walking around the house yesterday.    Patient is accompained by: Family member   daughter,  Elzie Rings   Pertinent History CKD, chronic CHF, Alzheimers, s/p Lt THR    Patient Stated Goals improve balance, decrease the dizziness    Currently in Pain? No/denies                             [facility] Adult PT Treatment/Exercise - 08/15/19 0001      Ambulation/Gait   Ambulation/Gait Yes    Ambulation/Gait Assistance 5: Supervision    Ambulation/Gait Assistance Details Cues for increased step length and heelstrike, with pt able to sustain during gait today.  Educated daughter to cue pt for increased step length and heelstrike with gait in short distances (with cane) in the home    Ambulation Distance (Feet) 400 Feet   gait with conversation tasks   Assistive device Straight cane    Gait Pattern Step-through pattern;Decreased stride length    Ambulation Surface Level;Indoor    Gait Comments Additional gait 20 ft x 2 reps, 100 ft x 2 reps           (Therapeutic Exercise-sit<>stand and consecutive hip kicks); other exercises are Neuro Re-education for balance   Reviewed HEP given several visits ago:  -Sit<>stand, x 10 reps from mat, then 5 reps from chair -Marching in place x 10 rep -Alternating hip kicks extension, x 10 reps, then same leg  kicks extension x 10 reps -alternating hip kicks abduction x 10 reps, then same leg kicks abduction x 10 reps  -Partial tandem stance 2 reps, 30 seconds -SLS 2 reps 10 seconds, intermittent UE support -sidestepping along counter, 3 reps with intermittent UE support.  Daughter reports they perform these once daily.  Pt requires verbal and initial visual cues for each exercise, but otherwise performs well.  He does require cues for upright posture throughout.    Balance Exercises - 08/15/19 0910      Balance Exercises: Standing   Tandem Gait Forward;Retro;Intermittent upper extremity support;3 reps    Retro Gait Foam/compliant surface;3 reps   Forward/back solid surface x 3 reps   Sidestepping Foam/compliant support;3 reps     Marching Foam/compliant surface;Intermittent upper extremity assist;Dynamic;Forwards;Solid surface   3 reps along counter   Heel Raises Both;10 reps   2 sets   Toe Raise Both;10 reps   2 sets            PT Education - 08/15/19 1322    Education Details Addition to HEP-see instructions    Person(s) Educated Patient;Child(ren)    Methods Explanation;Demonstration;Handout    Comprehension Verbalized understanding;Returned demonstration            PT Short Term Goals - 08/07/19 2015      PT SHORT TERM GOAL #1   Title same as LTG's             PT Long Term Goals - 08/09/19 1209      PT LONG TERM GOAL #1   Title Improve TUG score from 27.63 sec to </= 17 secs with use of SPC.    Baseline 27.63 secs with SPC    Time 4    Period Weeks    Status New      PT LONG TERM GOAL #2   Title Increase gait velocity from 1.85 ft/sec to >/= 2.2 ft/sec with SPC for increased gait efficiency.    Baseline 17.75 secs      PT LONG TERM GOAL #3   Title Pt will increase Berg balance test score by at least 5 points for reduced fall risk.    Baseline TBA; Berg score 40/56 on 08-08-19    Time 4    Period Weeks    Status New      PT LONG TERM GOAL #4   Title Pt will report at least 25% improvement in dizziness.    Time 4    Period Weeks    Status New      PT LONG TERM GOAL #5   Title Independent in HEP for balance exercises.    Time 4    Period Weeks    Status New                 Plan - 08/15/19 1320    Clinical Impression Statement REviewed pt's HEP from 2 sessions ago, with pt needing verbal/visual cues for reminders, but he is able to perform well.  Daughter reports doing exercises daily.  Also focused on exercises for imrpoved dynmaic balance on solid and complaint surfaces as well as gait training with cues for improved step length.  He will continue to benefit from skilled PT to further address balance and gait.    Personal Factors and Comorbidities  Age;Fitness;Comorbidity 2;Transportation;Other   Alzheimer's   Comorbidities CAD, CHF, Lt THR, Alzheimer's, dizziness    Examination-Activity Limitations Locomotion Level;Transfers;Bend;Stand;Stairs;Squat    Examination-Participation Restrictions Meal Prep;Cleaning;Medication Management;Community Activity  Stability/Clinical Decision Making Evolving/Moderate complexity    Rehab Potential Good    PT Frequency 2x / week    PT Duration 4 weeks   based on progress - may renew if progress is made   PT Treatment/Interventions ADLs/Self Care Home Management;Vestibular;Therapeutic exercise;Balance training;Neuromuscular re-education;Therapeutic activities;Functional mobility training;Gait training;Stair training;Patient/family education    PT Next Visit Plan Continue balance training (add/update to HEP as needed). Dual Tasking, Dynamic Gait; check on heel/toe raises as additions to HEP 08/15/2019    PT Home Exercise Plan sit to stand & balance HEP issued on 08-08-19; 08/15/2019:Medbridge code [id number]    Consulted and Agree with Plan of Care Patient;Family member/caregiver    Family Member Consulted daughter Elzie Rings           Patient will benefit from skilled therapeutic intervention in order to improve the following deficits and impairments:  Dizziness, Decreased balance, Decreased strength, Postural dysfunction, Difficulty walking, Decreased safety awareness  Visit Diagnosis: Unsteadiness on feet  Other abnormalities of gait and mobility     Problem List Patient Active Problem List   Diagnosis Date Noted  . 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  . Primary osteoarthritis of left hip 09/06/2018  . Alzheimer's dementia ([place]) 12/07/2016  . 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  . 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  . Venous stasis dermatitis of left lower extremity 04/07/2016  . Bilateral impacted cerumen 01/18/2016  . 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   . Unsteadiness 01/18/2016  . 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  . Cellulitis of left lower leg   . Left leg swelling   . Cellulitis 08/23/2014  . Chronic congestive heart failure ([place]) 04/29/2014  . Chronic kidney disease, stage II (mild)   . Thrombocytopenia, unspecified ([place]) 07/29/2010  . Vitamin D deficiency 05/19/2010  . Depression, major, recurrent, mild ([place]) 05/19/2010  . Urinary urgency 05/19/2010  . 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 Benign essential hypertension 12/23/2009  . Actinic keratosis 12/23/2009  . Seborrheic keratosis 03/18/2009  . Benign neoplasm 03/18/2009  . Other and unspecified hyperlipidemia 01/18/2008  . Obstructive sleep apnea 01/18/2008  . Coronary atherosclerosis 01/18/2008  . Atherosclerotic heart disease of native coronary artery without angina pectoris 01/18/2008  . 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 Unspecified protein-calorie malnutrition ([place]) 09/27/2006  . Gastro-esophageal reflux disease with esophagitis 09/15/2006  . 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 CAD (coronary artery disease), native coronary artery 05/11/2005    Anaise Sterbenz W. 08/15/2019, 1:26 PM  Frazier Butt., PT   [facility] [address] [place], [place], [address] Phone: [phone]   Fax:  [phone]  Name: [name] [name] MRN: [id number] Date of Birth: [birth year]

## 2019-08-15 NOTE — Patient Instructions (Signed)
Access Code: [email or web address] URL: [email or web address] Date: 08/15/2019 Prepared by: Mady Haagensen  Exercises Heel Toe Raises with Counter Support - 1 x daily - 7 x weekly - 2 sets - 10 reps

## 2019-08-20 ENCOUNTER — Ambulatory Visit: Payer: Medicare Other

## 2019-08-20 ENCOUNTER — Ambulatory Visit: Payer: Self-pay

## 2019-08-20 ENCOUNTER — Encounter: Payer: Self-pay | Admitting: Family Medicine

## 2019-08-20 ENCOUNTER — Ambulatory Visit: Payer: Medicare Other | Admitting: Family Medicine

## 2019-08-20 ENCOUNTER — Other Ambulatory Visit: Payer: Self-pay

## 2019-08-20 DIAGNOSIS — R2689 Other abnormalities of gait and mobility: Secondary | ICD-10-CM

## 2019-08-20 DIAGNOSIS — R42 Dizziness and giddiness: Secondary | ICD-10-CM

## 2019-08-20 DIAGNOSIS — M25511 Pain in right shoulder: Secondary | ICD-10-CM | POA: Diagnosis not present

## 2019-08-20 DIAGNOSIS — R2681 Unsteadiness on feet: Secondary | ICD-10-CM

## 2019-08-20 DIAGNOSIS — G8929 Other chronic pain: Secondary | ICD-10-CM | POA: Diagnosis not present

## 2019-08-20 NOTE — Progress Notes (Signed)
Subjective: Patient is here for ultrasound-guided intra-articular right glenohumeral injection.  Pain with overhead reach.  Objective:  Decreased ROM and pain reaching overhead.  Procedure: Ultrasound-guided right glenohumeral injection: After sterile prep with Betadine, injected 8 cc 1% lidocaine without epinephrine and 40 mg methylprednisolone using a 22-gauge spinal needle, passing the needle from posterior approach into the glenohumeral joint.  Injectate seen filling joint capsule.  Good immediate relief.

## 2019-08-20 NOTE — Therapy (Signed)
[facility] [address] [place], [place], [address] Phone: [phone]   Fax:  [phone]  Physical Therapy Treatment  Patient Details  Name: [name] [name] MRN: [id number] Date of Birth: 07-Aug-1940 Referring Provider (PT): Dr. Leta Baptist   Encounter Date: 08/20/2019   PT End of Session - 08/20/19 1417    Visit Number 5    Number of Visits 9    Date for PT Re-Evaluation 09/06/19    Authorization Type UHC Medicare    Authorization Time Period 08-05-19 - 10-05-19    PT Start Time 1329   pt arriving late   PT Stop Time 1400    PT Time Calculation (min) 31 min    Equipment Utilized During Treatment Gait belt    Activity Tolerance Patient tolerated treatment well    Behavior During Therapy [facility] for tasks assessed/performed           Past Medical History:  Diagnosis Date  . Alzheimer disease ([place])   . CHF (congestive heart failure) ([place])   . Chronic kidney disease    mild insuffiency  . Coronary artery disease    LHC 4/10: Mid LAD 40-50%, then 70%, OM1 20-30%, EF 65%. Mid LAD FFR 0.89 (not hemodynamically significant). Medical therapy was continued.  . Hyperlipidemia   . LBBB (left bundle branch block) 06/2018  . Obstructive sleep apnea     Past Surgical History:  Procedure Laterality Date  . CARDIAC CATHETERIZATION  05/02/2003   single vessel,moderate stenosis mid LAD  . CARDIAC CATHETERIZATION  06/12/2008   continue med. therapy  . right shoulder surgery    . TOTAL HIP ARTHROPLASTY Left 09/14/2018   Procedure: LEFT TOTAL HIP ARTHROPLASTY ANTERIOR APPROACH;  Surgeon: Mcarthur Rossetti, MD;  Location[facility];  Service: Orthopedics;  Laterality: Left;    There were no vitals filed for this visit.   Subjective Assessment - 08/20/19 1331    Subjective Patient reports doing well today. No falls or stumbles. Patient reports doing HEP occasionally.    Patient is accompained by: Family member   daughter, [name] [name]     Pertinent History CKD, chronic CHF, Alzheimers, s/p Lt THR    Patient Stated Goals improve balance, decrease the dizziness    Currently in Pain? Yes    Pain Score 3     Pain Location Back    Pain Orientation Lower    Pain Descriptors / Indicators Aching    Pain Type Chronic pain    Pain Onset More than a month ago                             [facility] Adult PT Treatment/Exercise - 08/20/19 0001      Ambulation/Gait   Ambulation/Gait Yes    Ambulation/Gait Assistance 5: Supervision    Ambulation/Gait Assistance Details PT providing cues for increased step length, as well as ensure proper use of SPC due to patient having it turned around.    Ambulation Distance (Feet) 230 Feet    Assistive device Straight cane    Gait Pattern Step-through pattern;Decreased stride length    Ambulation Surface Level;Indoor    Gait Comments Completed additional gait with horizontal/vertical head turns 1 x 115 ft each. Patient require increased verbal cues to ensure proper completion. Patient demo one instance of unsteadiness with horizontal head turns. PT CGA as needed.       High Level Balance   High Level Balance  Activities Backward walking;Tandem walking;Marching forwards    High Level Balance Comments In // bars: completed forward marching followed by backwards walkinng, x 6 laps. PT providing verbal cues for slowed pace and proper completion. Completed tandem walking, intiially using light UE support bilaterally, progressed to no UE support, x 5 laps.               Balance Exercises - 08/20/19 0001      Balance Exercises: Standing   Standing Eyes Opened Narrow base of support (BOS);Head turns;Foam/compliant surface    Standing Eyes Opened Limitations compelted horizontal/vertical head turns 1 x 10 reps each    Standing Eyes Closed Wide (BOA);Foam/compliant surface;3 reps;30 secs    Rockerboard Anterior/posterior;Lateral;EO;Limitations;Intermittent UE support    Rockerboard  Limitations Standing on rockerboard foucsed on holding steady ant/post 3 x 1 min each, then postioined laterally focused on holding steady 2 x 1 min each. Pt demo increased posterior sway with positioned ant/post. Pt require CGA throughout and intermittent UE support from parallel bars    Sidestepping Foam/compliant support;3 reps   completed along blue balance beam               PT Short Term Goals - 08/07/19 2015      PT SHORT TERM GOAL #1   Title same as LTG's             PT Long Term Goals - 08/09/19 1209      PT LONG TERM GOAL #1   Title Improve TUG score from 27.63 sec to </= 17 secs with use of SPC.    Baseline 27.63 secs with SPC    Time 4    Period Weeks    Status New      PT LONG TERM GOAL #2   Title Increase gait velocity from 1.85 ft/sec to >/= 2.2 ft/sec with SPC for increased gait efficiency.    Baseline 17.75 secs      PT LONG TERM GOAL #3   Title Pt will increase Berg balance test score by at least 5 points for reduced fall risk.    Baseline TBA; Berg score 40/56 on 08-08-19    Time 4    Period Weeks    Status New      PT LONG TERM GOAL #4   Title Pt will report at least 25% improvement in dizziness.    Time 4    Period Weeks    Status New      PT LONG TERM GOAL #5   Title Independent in HEP for balance exercises.    Time 4    Period Weeks    Status New                 Plan - 08/20/19 1424    Clinical Impression Statement Today's skilled session continued focus on gait training and dynamic balance acticities, progressing as tolerated by patient. Patient demo greatest difficulty maintinaing balance with vision removed and horizontal head turns. Patient will continue to benefit from skilled PT services to progress toward goals.    Personal Factors and Comorbidities Age;Fitness;Comorbidity 2;Transportation;Other   Alzheimer's   Comorbidities CAD, CHF, Lt THR, Alzheimer's, dizziness    Examination-Activity Limitations Locomotion  Level;Transfers;Bend;Stand;Stairs;Squat    Examination-Participation Restrictions Meal Prep;Cleaning;Medication Management;Community Activity    Stability/Clinical Decision Making Evolving/Moderate complexity    Rehab Potential Good    PT Frequency 2x / week    PT Duration 4 weeks   based on progress - may renew if  progress is made   PT Treatment/Interventions ADLs/Self Care Home Management;Vestibular;Therapeutic exercise;Balance training;Neuromuscular re-education;Therapeutic activities;Functional mobility training;Gait training;Stair training;Patient/family education    PT Next Visit Plan Continue balance training (add/update to HEP as needed). Dual Tasking, Dynamic Gait    PT Home Exercise Plan sit to stand & balance HEP issued on 08-08-19; 08/15/2019:Medbridge code [email or web address]    Consulted and Agree with Plan of Care Patient;Family member/caregiver    Family Member Consulted daughter [name] [name]           Patient will benefit from skilled therapeutic intervention in order to improve the following deficits and impairments:  Dizziness, Decreased balance, Decreased strength, Postural dysfunction, Difficulty walking, Decreased safety awareness  Visit Diagnosis: Unsteadiness on feet  Other abnormalities of gait and mobility  Dizziness and giddiness     Problem List Patient Active Problem List   Diagnosis Date Noted  . 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  . Primary osteoarthritis of left hip 09/06/2018  . Alzheimer's dementia ([place]) 12/07/2016  . 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  . 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  . Venous stasis dermatitis of left lower extremity 04/07/2016  . Bilateral impacted cerumen 01/18/2016  . 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  . Unsteadiness 01/18/2016  . 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  . Cellulitis of left lower leg   . Left leg swelling   . Cellulitis 08/23/2014  . Chronic congestive heart failure  ([place]) 04/29/2014  . Chronic kidney disease, stage II (mild)   . Thrombocytopenia, unspecified ([place]) 07/29/2010  . Vitamin D deficiency 05/19/2010  . Depression, major, recurrent, mild ([place]) 05/19/2010  . Urinary urgency 05/19/2010  . 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 Benign essential hypertension 12/23/2009  . Actinic keratosis 12/23/2009  . Seborrheic keratosis 03/18/2009  . Benign neoplasm 03/18/2009  . Other and unspecified hyperlipidemia 01/18/2008  . Obstructive sleep apnea 01/18/2008  . Coronary atherosclerosis 01/18/2008  . Atherosclerotic heart disease of native coronary artery without angina pectoris 01/18/2008  . 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 Unspecified protein-calorie malnutrition ([place]) 09/27/2006  . Gastro-esophageal reflux disease with esophagitis 09/15/2006  . 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 CAD (coronary artery disease), native coronary artery 05/11/2005    Jones Bales, PT, DPT 08/20/2019, 2:26 PM  [facility] [address] [place], [place], [address] Phone: [phone]   Fax:  [phone]  Name: [name] [name] MRN: [id number] Date of Birth: 12-11-1940

## 2019-08-22 ENCOUNTER — Other Ambulatory Visit: Payer: Self-pay

## 2019-08-22 ENCOUNTER — Ambulatory Visit: Payer: Medicare Other | Admitting: Physical Therapy

## 2019-08-22 DIAGNOSIS — R2689 Other abnormalities of gait and mobility: Secondary | ICD-10-CM

## 2019-08-22 DIAGNOSIS — R2681 Unsteadiness on feet: Secondary | ICD-10-CM

## 2019-08-22 NOTE — Therapy (Signed)
[facility] [address] [place], [place], [address] Phone: [phone]   Fax:  [phone]  Physical Therapy Treatment  Patient Details  Name: [name] [name] MRN: [id number] Date of Birth: [birth year] Referring Provider (PT): Dr. Leta Baptist   Encounter Date: 08/22/2019   PT End of Session - 08/22/19 1334    Visit Number 6    Number of Visits 9    Date for PT Re-Evaluation 09/06/19    Authorization Type UHC Medicare    Authorization Time Period 08-05-19 - 10-05-19    PT Start Time 1019    PT Stop Time 1100    PT Time Calculation (min) 41 min    Equipment Utilized During Treatment Gait belt    Activity Tolerance Patient tolerated treatment well    Behavior During Therapy WFL for tasks assessed/performed           Past Medical History:  Diagnosis Date   Alzheimer disease ([place])    CHF (congestive heart failure) ([place])    Chronic kidney disease    mild insuffiency   Coronary artery disease    LHC 4/10: Mid LAD 40-50%, then 70%, OM1 20-30%, EF 65%. Mid LAD FFR 0.89 (not hemodynamically significant). Medical therapy was continued.   Hyperlipidemia    LBBB (left bundle branch block) 06/2018   Obstructive sleep apnea     Past Surgical History:  Procedure Laterality Date   CARDIAC CATHETERIZATION  05/02/2003   single vessel,moderate stenosis mid LAD   CARDIAC CATHETERIZATION  06/12/2008   continue med. therapy   right shoulder surgery     TOTAL HIP ARTHROPLASTY Left 09/14/2018   Procedure: LEFT TOTAL HIP ARTHROPLASTY ANTERIOR APPROACH;  Surgeon: Mcarthur Rossetti, MD;  Location: [facility];  Service: Orthopedics;  Laterality: Left;    There were no vitals filed for this visit.   Subjective Assessment - 08/22/19 1021    Subjective No changes since last visit.  I have to admit, I'm not doing my exercises too much.    Patient is accompained by: --    Pertinent History CKD, chronic CHF, Alzheimers, s/p Lt THR     Patient Stated Goals improve balance, decrease the dizziness    Currently in Pain? No/denies    Pain Onset More than a month ago                             [facility] Adult PT Treatment/Exercise - 08/22/19 1023      Transfers   Transfers Sit to Stand;Stand to Sit    Sit to Stand 6: Modified independent (Device/Increase time);Without upper extremity assist;From chair/3-in-1   hands on knees   Stand to Sit 6: Modified independent (Device/Increase time);Without upper extremity assist;To chair/3-in-1    Number of Reps Other reps (comment);2 sets   5 reps     Ambulation/Gait   Ambulation/Gait Yes    Ambulation/Gait Assistance 5: Supervision    Ambulation Distance (Feet) 230 Feet    Assistive device Straight cane    Gait Pattern Step-through pattern;Decreased stride length    Ambulation Surface Level;Indoor;Unlevel;Outdoor    Gait Comments Additional gait outdoor surfaces, including sidewalk, asphalt x 250 ft with cane and supervision.  Conversational tasks during gait, no LOB noted.  Short distance gait in gym, 115 ft with head turns to look at cards, using cane with supervision, with slowed pace during head turns.  Balance Exercises - 08/22/19 0001      Balance Exercises: Standing   Standing Eyes Opened Wide (BOA);Foam/compliant surface;Head turns;5 reps   Head nods with UE support at counter   Standing Eyes Closed Wide (BOA);Foam/compliant surface;Head turns;5 reps   Head nods, UE support at counter   SLS with Vectors Upper extremity assist 1;Other reps (comment)   10 reps alt step taps to 6", 12" steps    Step Ups Forward;6 inch;UE support 1   10 reps    Gait with Head Turns Forward;4 reps   along counter   Retro Gait Upper extremity support;3 reps   Fwd/back walking   Sidestepping Head turns;3 reps   R and L along counter   Heel Raises Both;10 reps   Additional 10 reps on Airex   Toe Raise Both;10 reps   Additional 10 reps on airex   Other  Standing Exercises Comments Pt provides cues for upright posture, to avoid looking down during balance activities.          Step strategy exercises:  Standing on Airex foam:  Side step and weightshift, then return to middle start position x 10 reps, then back step and weightshift then return to middle start position x 10 reps.  Cues for foot clearance and for upright posture upon return to midline start position.     PT Short Term Goals - 08/07/19 2015      PT SHORT TERM GOAL #1   Title same as LTG's             PT Long Term Goals - 08/09/19 1209      PT LONG TERM GOAL #1   Title Improve TUG score from 27.63 sec to </= 17 secs with use of SPC.    Baseline 27.63 secs with SPC    Time 4    Period Weeks    Status New      PT LONG TERM GOAL #2   Title Increase gait velocity from 1.85 ft/sec to >/= 2.2 ft/sec with SPC for increased gait efficiency.    Baseline 17.75 secs      PT LONG TERM GOAL #3   Title Pt will increase Berg balance test score by at least 5 points for reduced fall risk.    Baseline TBA; Berg score 40/56 on 08-08-19    Time 4    Period Weeks    Status New      PT LONG TERM GOAL #4   Title Pt will report at least 25% improvement in dizziness.    Time 4    Period Weeks    Status New      PT LONG TERM GOAL #5   Title Independent in HEP for balance exercises.    Time 4    Period Weeks    Status New                 Plan - 08/22/19 1335    Clinical Impression Statement Focus of today's skilled PT session included single limb stance and balance activities on solid/dynamic surfaces.  In addition, worked on gait with head turns and conversation tasks, on indoor and outdoor surfaces.  No LOB noted, but pt does have slowed gait pattern and wide BOS to maintain balance.  He will conitnue to benefit from skilled PT to further address balance and gait for improved mobilitya nd decreased fall risk.    Personal Factors and Comorbidities Age;Fitness;Comorbidity  2;Transportation;Other   Alzheimer's   Comorbidities CAD,  CHF, Lt THR, Alzheimer's, dizziness    Examination-Activity Limitations Locomotion Level;Transfers;Bend;Stand;Stairs;Squat    Examination-Participation Restrictions Meal Prep;Cleaning;Medication Management;Community Activity    Stability/Clinical Decision Making Evolving/Moderate complexity    Rehab Potential Good    PT Frequency 2x / week    PT Duration 4 weeks   based on progress - may renew if progress is made   PT Treatment/Interventions ADLs/Self Care Home Management;Vestibular;Therapeutic exercise;Balance training;Neuromuscular re-education;Therapeutic activities;Functional mobility training;Gait training;Stair training;Patient/family education    PT Next Visit Plan Next week appears to be wk 4 of 4 in POC, so LTGs need to be assessed to determine renew vs. d/c.  (he is scheduled on July 8, which is outside of current POC).  [place] to address dynamic balance.    PT Home Exercise Plan sit to stand & balance HEP issued on 08-08-19; 08/15/2019:Medbridge code [id number]    Consulted and Agree with Plan of Care Patient;Family member/caregiver    Family Member Consulted daughter Elzie Rings           Patient will benefit from skilled therapeutic intervention in order to improve the following deficits and impairments:  Dizziness, Decreased balance, Decreased strength, Postural dysfunction, Difficulty walking, Decreased safety awareness  Visit Diagnosis: Unsteadiness on feet  Other abnormalities of gait and mobility     Problem List Patient Active Problem List   Diagnosis Date Noted   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   Primary osteoarthritis of left hip 09/06/2018   Alzheimer's dementia ([place]) 12/07/2016   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   Venous stasis dermatitis of left lower extremity 04/07/2016   Bilateral impacted cerumen 01/18/2016   Sensorineural hearing  loss (SNHL) of both ears 01/18/2016   Unsteadiness 01/18/2016   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   Cellulitis of left lower leg    Left leg swelling    Cellulitis 08/23/2014   Chronic congestive heart failure ([place]) 04/29/2014   Chronic kidney disease, stage II (mild)    Thrombocytopenia, unspecified ([place]) 07/29/2010   Vitamin D deficiency 05/19/2010   Depression, major, recurrent, mild ([place]) 05/19/2010   Urinary urgency 05/19/2010   Vertigo, 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   Benign essential hypertension 12/23/2009   Actinic keratosis 12/23/2009   Seborrheic keratosis 03/18/2009   Benign neoplasm 03/18/2009   Other and unspecified hyperlipidemia 01/18/2008   Obstructive sleep apnea 01/18/2008   Coronary atherosclerosis 01/18/2008   Atherosclerotic heart disease of native coronary artery without angina pectoris 01/18/2008   Cataract in degenerative disorder 11/21/2006   Unspecified protein-calorie malnutrition ([place]) 09/27/2006   Gastro-esophageal reflux disease with esophagitis 09/15/2006   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 08/22/2019, 1:41 PM  Frazier Butt., PT   [facility] [address] [place], [place], [address] Phone: [phone]   Fax:  [phone]  Name: ISAM UNREIN MRN: [id number] Date of Birth: Nov 09, 1940

## 2019-08-26 ENCOUNTER — Encounter: Payer: Self-pay | Admitting: Physical Therapy

## 2019-08-26 ENCOUNTER — Other Ambulatory Visit: Payer: Self-pay

## 2019-08-26 ENCOUNTER — Ambulatory Visit: Payer: Medicare Other | Admitting: Physical Therapy

## 2019-08-26 DIAGNOSIS — R2681 Unsteadiness on feet: Secondary | ICD-10-CM

## 2019-08-26 DIAGNOSIS — R2689 Other abnormalities of gait and mobility: Secondary | ICD-10-CM

## 2019-08-27 NOTE — Therapy (Signed)
[facility] [address] [place], [place], [address] Phone: [phone]   Fax:  [phone]  Physical Therapy Treatment  Patient Details  Name: [name] [name] MRN: [id number] Date of Birth: 1940-08-13 Referring Provider (PT): Dr. Leta Baptist   Encounter Date: 08/26/2019   PT End of Session - 08/27/19 2204    Visit Number 7    Number of Visits 9    Date for PT Re-Evaluation 09/06/19    Authorization Type UHC Medicare    Authorization Time Period 08-05-19 - 10-05-19    PT Start Time 0932    PT Stop Time 1016    PT Time Calculation (min) 44 min    Equipment Utilized During Treatment Gait belt    Activity Tolerance Patient tolerated treatment well    Behavior During Therapy [facility] for tasks assessed/performed           Past Medical History:  Diagnosis Date  . Alzheimer disease ([place])   . CHF (congestive heart failure) ([place])   . Chronic kidney disease    mild insuffiency  . Coronary artery disease    LHC 4/10: Mid LAD 40-50%, then 70%, OM1 20-30%, EF 65%. Mid LAD FFR 0.89 (not hemodynamically significant). Medical therapy was continued.  . Hyperlipidemia   . LBBB (left bundle branch block) 06/2018  . Obstructive sleep apnea     Past Surgical History:  Procedure Laterality Date  . CARDIAC CATHETERIZATION  05/02/2003   single vessel,moderate stenosis mid LAD  . CARDIAC CATHETERIZATION  06/12/2008   continue med. therapy  . right shoulder surgery    . TOTAL HIP ARTHROPLASTY Left 09/14/2018   Procedure: LEFT TOTAL HIP ARTHROPLASTY ANTERIOR APPROACH;  Surgeon: Mcarthur Rossetti, MD;  Location: [facility];  Service: Orthopedics;  Laterality: Left;    There were no vitals filed for this visit.                      [facility] Adult PT Treatment/Exercise - 08/27/19 0001      Transfers   Transfers Sit to Stand    Sit to Stand 5: Supervision    Number of Reps 10 reps    Comments 5 reps feet on floor; 5 reps  feet on Airex - no UE support used       Ambulation/Gait   Ambulation/Gait Yes    Ambulation/Gait Assistance 5: Supervision    Ambulation Distance (Feet) 115 Feet    Assistive device None    Gait Pattern Step-through pattern    Ambulation Surface Level;Indoor      Neuro Re-ed    Neuro Re-ed Details  Pt performed tap ups to 1st step 10 reps, to 2nd step 5 reps each LE with  UE support prn      Exercises   Exercises Knee/Hip      Knee/Hip Exercises: Aerobic   Recumbent Bike 5" Scifit level 2.5 with UE's and LE's               Balance Exercises - 08/27/19 0001      Balance Exercises: Standing   Standing Eyes Opened Wide (BOA);Solid surface;5 reps    Standing Eyes Closed Wide (BOA);Head turns;Solid surface;5 reps    Rockerboard Anterior/posterior;EO;10 reps;UE support    Step Ups Forward;UE support 1;6 inch   10 reps each LE   Sidestepping 5 reps    Heel Raises Both;10 reps    Other Standing Exercises Pt performed marching on Airex - with min  to mod assist to maintain balance     Other Standing Exercises Comments Pt performed standing forward, back and side kicks 10 reps each with 1 UE support               PT Short Term Goals - 08/27/19 2201      PT SHORT TERM GOAL #1   Title same as LTG's             PT Long Term Goals - 08/27/19 2201      PT LONG TERM GOAL #1   Title Improve TUG score from 27.63 sec to </= 17 secs with use of SPC.    Baseline 27.63 secs with SPC    Time 4    Period Weeks    Status New      PT LONG TERM GOAL #2   Title Increase gait velocity from 1.85 ft/sec to >/= 2.2 ft/sec with SPC for increased gait efficiency.    Baseline 17.75 secs      PT LONG TERM GOAL #3   Title Pt will increase Berg balance test score by at least 5 points for reduced fall risk.    Baseline TBA; Berg score 40/56 on 08-08-19    Time 4    Period Weeks    Status New      PT LONG TERM GOAL #4   Title Pt will report at least 25% improvement in dizziness.      Time 4    Period Weeks    Status New      PT LONG TERM GOAL #5   Title Independent in HEP for balance exercises.    Time 4    Period Weeks    Status New                 Plan - 08/27/19 2149    Clinical Impression Statement Session focused on balance training with emphasis on SLS and also LE strengthening exercises included.  Pt reports no significant changes in status or progress, however, he reports minimal compliance with HEP.  Plan to D/C next session due to completion of program and pt's request.    PT Next Visit Plan check goals and D/C next session as this is the 4th week in POC - pt states he does not wish to continue (we need to cancel 09-05-19 appt as outside cert period)           Patient will benefit from skilled therapeutic intervention in order to improve the following deficits and impairments:     Visit Diagnosis: Unsteadiness on feet  Other abnormalities of gait and mobility     Problem List Patient Active Problem List   Diagnosis Date Noted  . 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  . Primary osteoarthritis of left hip 09/06/2018  . Alzheimer's dementia ([place]) 12/07/2016  . 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  . 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  . Venous stasis dermatitis of left lower extremity 04/07/2016  . Bilateral impacted cerumen 01/18/2016  . 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  . Unsteadiness 01/18/2016  . 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  . Cellulitis of left lower leg   . Left leg swelling   . Cellulitis 08/23/2014  . Chronic congestive heart failure ([place]) 04/29/2014  . Chronic kidney disease, stage II (mild)   . Thrombocytopenia, unspecified ([place]) 07/29/2010  . Vitamin D deficiency 05/19/2010  . 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 Urinary urgency 05/19/2010  .  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 Benign essential hypertension 12/23/2009   . Actinic keratosis 12/23/2009  . Seborrheic keratosis 03/18/2009  . Benign neoplasm 03/18/2009  . Other and unspecified hyperlipidemia 01/18/2008  . Obstructive sleep apnea 01/18/2008  . Coronary atherosclerosis 01/18/2008  . Atherosclerotic heart disease of native coronary artery without angina pectoris 01/18/2008  . 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 Unspecified protein-calorie malnutrition ([place]) 09/27/2006  . Gastro-esophageal reflux disease with esophagitis 09/15/2006  . 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 CAD (coronary artery disease), native coronary artery 05/11/2005    Octavia Velador, [place], PT 08/27/2019, 10:04 PM  [facility] [address] [place], [place], [address] Phone: [phone]   Fax:  [phone]  Name: [name] [name] MRN: [id number] Date of Birth: [birth year]

## 2019-08-29 ENCOUNTER — Other Ambulatory Visit: Payer: Self-pay

## 2019-08-29 ENCOUNTER — Ambulatory Visit: Payer: Medicare Other | Attending: Family Medicine

## 2019-08-29 DIAGNOSIS — R42 Dizziness and giddiness: Secondary | ICD-10-CM | POA: Diagnosis present

## 2019-08-29 DIAGNOSIS — R2681 Unsteadiness on feet: Secondary | ICD-10-CM | POA: Insufficient documentation

## 2019-08-29 DIAGNOSIS — R2689 Other abnormalities of gait and mobility: Secondary | ICD-10-CM | POA: Insufficient documentation

## 2019-08-29 NOTE — Therapy (Signed)
[facility] [address] , [place], [address] Phone: [phone]   Fax:  [phone]  Physical Therapy Treatment/Discharge Summary  Patient Details  Name: [name] [name] MRN: [id number] Date of Birth: 04/10/1940 Referring Provider (PT): Dr. Su Teoh   PHYSICAL THERAPY DISCHARGE SUMMARY  Visits from Start of Care: 8  Current functional level related to goals / functional outcomes: See Clinical Impression Statement for Details.    Remaining deficits: Decreased Balance, Increased Risk for Falls, Mild Intermittent Dizziness   Education / Equipment: Educated on importance of completion of HEP/walking program.  Plan: Patient agrees to discharge.  Patient goals were partially met. Patient is being discharged due to the patient's request.  ??Patient reports that he is pleased with his current functional status and would not like to add additional visits at this time and would like to discharge. PT educated on progress made with therapy services at this time, and importance of self management upon discharge and importance of completion of HEP at home to maintain gains. ???                Encounter Date: 08/29/2019   PT End of Session - 08/29/19 1314    Visit Number 8    Number of Visits 9    Date for PT Re-Evaluation 09/06/19    Authorization Type [employer] Medicare    Authorization Time Period 08-05-19 - 10-05-19    PT Start Time 1243   pt arriving late   PT Stop Time 1314    PT Time Calculation (min) 31 min    Equipment Utilized During Treatment Gait belt    Activity Tolerance Patient tolerated treatment well    Behavior During Therapy WFL for tasks assessed/performed           Past Medical History:  Diagnosis Date  . Alzheimer disease (HCC)   . CHF (congestive heart failure) (HCC)   . Chronic kidney disease    mild insuffiency  . Coronary artery disease    LHC 4/10: Mid LAD 40-50%, then 70%, OM1  20-30%, EF 65%. Mid LAD FFR 0.89 (not hemodynamically significant). Medical therapy was continued.  . Hyperlipidemia   . LBBB (left bundle branch block) 06/2018  . Obstructive sleep apnea     Past Surgical History:  Procedure Laterality Date  . CARDIAC CATHETERIZATION  05/02/2003   single vessel,moderate stenosis mid LAD  . CARDIAC CATHETERIZATION  06/12/2008   continue med. therapy  . right shoulder surgery    . TOTAL HIP ARTHROPLASTY Left 09/14/2018   Procedure: LEFT TOTAL HIP ARTHROPLASTY ANTERIOR APPROACH;  Surgeon: Blackman, [name] Y, MD;  Location: [facility];  Service: Orthopedics;  Laterality: Left;    There were no vitals filed for this visit.   Subjective Assessment - 08/29/19 1318    Subjective Patient reports doing well. Does not want to schedule any more visits at this time, and would like to discharge.    Pertinent History CKD, chronic CHF, Alzheimers, s/p Lt THR    Patient Stated Goals improve balance, decrease the dizziness    Currently in Pain? No/denies    Pain Onset More than a month ago                             [facility] Adult PT Treatment/Exercise - 08/29/19 1252      Transfers   Transfers Sit to Stand;Stand to Sit    Sit to Stand 6:   Modified independent (Device/Increase time)    Stand to Sit 6: Modified independent (Device/Increase time)      Ambulation/Gait   Ambulation/Gait Yes    Ambulation/Gait Assistance 6: Modified independent (Device/Increase time);4: Min assist    Assistive device Straight cane    Gait Pattern Step-through pattern    Ambulation Surface Level;Indoor    Gait velocity 16.52 secs = 1.98 ft/sec      Standardized Balance Assessment   Standardized Balance Assessment Berg Balance Test;Timed Up and Go Test      Berg Balance Test   Sit to Stand Able to stand without using hands and stabilize independently    Standing Unsupported Able to stand safely 2 minutes    Sitting with Back Unsupported but Feet Supported on  Floor or Stool Able to sit safely and securely 2 minutes    Stand to Sit Sits safely with minimal use of hands    Transfers Able to transfer safely, minor use of hands    Standing Unsupported with Eyes Closed Able to stand 10 seconds with supervision    Standing Ubsupported with Feet Together Able to place feet together independently and stand for 1 minute with supervision    From Standing, Reach Forward with Outstretched Arm Can reach forward >12 cm safely (5")    From Standing Position, Pick up Object from Floor Able to pick up shoe, needs supervision    From Standing Position, Turn to Look Behind Over each Shoulder Looks behind one side only/other side shows less weight shift    Turn 360 Degrees Able to turn 360 degrees safely but slowly    Standing Unsupported, Alternately Place Feet on Step/Stool Able to complete 4 steps without aid or supervision    Standing Unsupported, One Foot in Front Able to plae foot ahead of the other independently and hold 30 seconds    Standing on One Leg Able to lift leg independently and hold equal to or more than 3 seconds    Total Score 44      Timed Up and Go Test   TUG Normal TUG    Normal TUG (seconds) 20.82   with SPC     Self-Care   Self-Care Other Self-Care Comments    Other Self-Care Comments  PT educating on importance of continuing to complete HEP to maintain gains achieved with PT services. PT also educating on contuining to complete walking program in addition. Patient reports dififculty with compliance of HEP, and "can not promise" he will complete them upon discharge.                   PT Education - 08/29/19 1317    Education Details Educated on progress toward LTG's, Completion of HEP to maintain gains. Patient reporting that he is pleased and would not like to renew for more PT visits at this time.    Person(s) Educated Patient    Methods Explanation    Comprehension Verbalized understanding            PT Short Term Goals -  08/27/19 2201      PT SHORT TERM GOAL #1   Title same as LTG's             PT Long Term Goals - 08/29/19 1247      PT LONG TERM GOAL #1   Title Improve TUG score from 27.63 sec to </= 17 secs with use of SPC.    Baseline 27.63 secs with SPC, 20.82 secs w/ SPC      Time 4    Period Weeks    Status Not Met      PT LONG TERM GOAL #2   Title Increase gait velocity from 1.85 ft/sec to >/= 2.2 ft/sec with SPC for increased gait efficiency.    Baseline 17.75 secs, 1.98 ft/sec on 08/29/19    Status Not Met      PT LONG TERM GOAL #3   Title Pt will increase Berg balance test score by at least 5 points for reduced fall risk.    Baseline TBA; Berg score 40/56 on 08-08-19; 44/56 on 08/29/19    Time 4    Period Weeks    Status Not Met      PT LONG TERM GOAL #4   Title Pt will report at least 25% improvement in dizziness.    Baseline Patient reports not experiencing dizziness as freq, unable to report % improvement    Time 4    Period Weeks    Status Partially Met      PT LONG TERM GOAL #5   Title Independent in HEP for balance exercises.    Baseline Patient reports feel comfortable with balance exercises, however only completing 2-3x/week    Time 4    Period Weeks    Status Partially Met                 Plan - 08/29/19 1327    Clinical Impression Statement Today's skilled sesion focused on assessment of patient's progress toward LTGs. Patient did not meet LTG 1-3, and partially met LTG 4 and 5. However, patient did demonstrate progress toward goals with improvement in Berg Balance to 44/56, and slight improvements in TUG and Gait Speed. Despite not meeting goals, and progres patient requesting discharge from therapy services due to being pleased with current functional status. PT providing education on importance of compliance with HEP at home and walking program to maintain gains achieved with PT services.    Personal Factors and Comorbidities Age;Fitness;Comorbidity  2;Transportation;Other    Comorbidities CAD, CHF, Lt THR, Alzheimer's, dizziness    Examination-Activity Limitations Locomotion Level;Transfers;Bend;Stand;Stairs;Squat    Examination-Participation Restrictions Meal Prep;Cleaning;Medication Management;Community Activity    Rehab Potential Good    PT Frequency 2x / week    PT Duration 4 weeks    PT Treatment/Interventions ADLs/Self Care Home Management;Vestibular;Therapeutic exercise;Balance training;Neuromuscular re-education;Therapeutic activities;Functional mobility training;Gait training;Stair training;Patient/family education    Consulted and Agree with Plan of Care Patient           Patient will benefit from skilled therapeutic intervention in order to improve the following deficits and impairments:  Dizziness, Decreased balance, Decreased strength, Postural dysfunction, Difficulty walking, Decreased safety awareness  Visit Diagnosis: Unsteadiness on feet  Other abnormalities of gait and mobility  Dizziness and giddiness     Problem List Patient Active Problem List   Diagnosis Date Noted  . 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  . Primary osteoarthritis of left hip 09/06/2018  . Alzheimer's dementia ([place]) 12/07/2016  . 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  . 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  . Venous stasis dermatitis of left lower extremity 04/07/2016  . Bilateral impacted cerumen 01/18/2016  . 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  . Unsteadiness 01/18/2016  . 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  . Cellulitis of left lower leg   . Left leg swelling   . Cellulitis 08/23/2014  . Chronic congestive heart failure ([place]) 04/29/2014  . Chronic kidney disease, stage II (mild)   .  Thrombocytopenia, unspecified (HCC) 07/29/2010  . Vitamin D deficiency 05/19/2010  . Depression, major, recurrent, 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  . Urinary urgency 05/19/2010  . Vertigo, late effect of  cerebrovascular disease 04/29/2010  . Benign essential hypertension 12/23/2009  . Actinic keratosis 12/23/2009  . Seborrheic keratosis 03/18/2009  . Benign neoplasm 03/18/2009  . Other and unspecified hyperlipidemia 01/18/2008  . Obstructive sleep apnea 01/18/2008  . Coronary atherosclerosis 01/18/2008  . Atherosclerotic heart disease of native coronary artery without angina pectoris 01/18/2008  . 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 Unspecified protein-calorie malnutrition (HCC) 09/27/2006  . Gastro-esophageal reflux disease with esophagitis 09/15/2006  . 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 CAD (coronary artery disease), native coronary artery 05/11/2005     B , PT, DPT 08/29/2019, 1:31 PM  [facility] [address] , [place], [address] Phone: [phone]   Fax:  [phone]  Name: [name] [name] MRN: [id number] Date of Birth: 10/12/1940   

## 2019-09-03 ENCOUNTER — Ambulatory Visit: Payer: Medicare Other | Admitting: Physical Therapy

## 2019-09-04 ENCOUNTER — Ambulatory Visit: Payer: Self-pay

## 2019-09-04 ENCOUNTER — Ambulatory Visit (INDEPENDENT_AMBULATORY_CARE_PROVIDER_SITE_OTHER): Payer: Medicare Other | Admitting: Orthopaedic Surgery

## 2019-09-04 ENCOUNTER — Encounter: Payer: Self-pay | Admitting: Orthopaedic Surgery

## 2019-09-04 ENCOUNTER — Other Ambulatory Visit: Payer: Self-pay

## 2019-09-04 DIAGNOSIS — G8929 Other chronic pain: Secondary | ICD-10-CM | POA: Diagnosis not present

## 2019-09-04 DIAGNOSIS — Z96642 Presence of left artificial hip joint: Secondary | ICD-10-CM | POA: Diagnosis not present

## 2019-09-04 DIAGNOSIS — M25511 Pain in right shoulder: Secondary | ICD-10-CM

## 2019-09-04 NOTE — Progress Notes (Signed)
Office Visit Note   Patient: [name] [name]           Date of Birth: 06-07-1940           MRN: [id number] Visit Date: 09/04/2019              Requested by: Chesley Noon, MD [place],  [place] [id number] PCP: Chesley Noon, MD   Assessment & Plan: Visit Diagnoses:  1. Status post left hip replacement   2. Chronic right shoulder pain     Plan: Discussed with him that would recommend right shoulder subacromial injections no more often than every 3 months and intra-articular every 6 months.  Did discuss with him that overhead activity and extreme abduction will exacerbate his shoulder pain.  In regards to the left hip we will see him back on an as-needed basis.  Questions were encouraged and answered at length.  Follow-Up Instructions: Return if symptoms worsen or fail to improve.   Orders:  Orders Placed This Encounter  Procedures  . XR HIP UNILAT W OR W/O PELVIS 2-3 VIEWS LEFT   No orders of the defined types were placed in this encounter.     Procedures: No procedures performed   Clinical Data: No additional findings.   Subjective: Chief Complaint  Patient presents with  . Left Hip - Follow-up  . Right Shoulder - Follow-up    HPI [name] [name] returns today almost 1 year status post left total hip arthroplasty.  He did mention at last visit he was having some feelings of the left hip slipping when on the commode.  He states this is resolved.  Is otherwise doing well in regards to the left total hip.  In regards to his right shoulder the recent subacromial injection and intra-articular injection did help until he was going to shower yesterday.  Review of Systems  Constitutional: Negative for chills and fever.  Musculoskeletal: Positive for arthralgias.  Neurological: Positive for dizziness.     Objective: Vital Signs: There were no vitals taken for this visit.  Physical Exam Constitutional:      Appearance: He is not ill-appearing or  diaphoretic.  Neurological:     Mental Status: He is alert and oriented to person, place, and time.  Psychiatric:        Behavior: Behavior normal.     Ortho Exam Left hip excellent range of motion without pain.  Walks with a slight shuffling-like gait with the use of a cane in his right hand. Specialty Comments:  No specialty comments available.  Imaging: XR HIP UNILAT W OR W/O PELVIS 2-3 VIEWS LEFT  Result Date: 09/04/2019 AP Pelvis and lateral left hip : Bilateral hips well located.  No acute fractures.  The left total hip arthroplasty opponents well-seated without any signs of complication.    PMFS History: Patient Active Problem List   Diagnosis Date Noted  . 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  . Primary osteoarthritis of left hip 09/06/2018  . Alzheimer's dementia ([place]) 12/07/2016  . 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  . 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  . Venous stasis dermatitis of left lower extremity 04/07/2016  . Bilateral impacted cerumen 01/18/2016  . 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  . Unsteadiness 01/18/2016  . 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  . Cellulitis of left lower leg   . Left leg swelling   . Cellulitis 08/23/2014  . Chronic congestive heart failure ([place]) 04/29/2014  .  Chronic kidney disease, stage II (mild)   . Thrombocytopenia, unspecified ([place]) 07/29/2010  . Vitamin D deficiency 05/19/2010  . Depression, major, recurrent, mild ([place]) 05/19/2010  . Urinary urgency 05/19/2010  . 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 Benign essential hypertension 12/23/2009  . Actinic keratosis 12/23/2009  . Seborrheic keratosis 03/18/2009  . Benign neoplasm 03/18/2009  . Other and unspecified hyperlipidemia 01/18/2008  . Obstructive sleep apnea 01/18/2008  . Coronary atherosclerosis 01/18/2008  . Atherosclerotic heart disease of native coronary artery without angina pectoris  01/18/2008  . 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 Unspecified protein-calorie malnutrition ([place]) 09/27/2006  . Gastro-esophageal reflux disease with esophagitis 09/15/2006  . 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 CAD (coronary artery disease), native coronary artery 05/11/2005   Past Medical History:  Diagnosis Date  . Alzheimer disease ([place])   . CHF (congestive heart failure) ([place])   . Chronic kidney disease    mild insuffiency  . Coronary artery disease    LHC 4/10: Mid LAD 40-50%, then 70%, OM1 20-30%, EF 65%. Mid LAD FFR 0.89 (not hemodynamically significant). Medical therapy was continued.  . Hyperlipidemia   . LBBB (left bundle branch block) 06/2018  . Obstructive sleep apnea     Family History  Problem Relation Age of Onset  . Cancer Mother   . Hypertension Mother   . Other Father        old age  . Hypertension Father     Past Surgical History:  Procedure Laterality Date  . CARDIAC CATHETERIZATION  05/02/2003   single vessel,moderate stenosis mid LAD  . CARDIAC CATHETERIZATION  06/12/2008   continue med. therapy  . right shoulder surgery    . TOTAL HIP ARTHROPLASTY Left 09/14/2018   Procedure: LEFT TOTAL HIP ARTHROPLASTY ANTERIOR APPROACH;  Surgeon: Mcarthur Rossetti, MD;  Location: [facility];  Service: Orthopedics;  Laterality: Left;   Social History   Occupational History  . Occupation: [employer]  Tobacco Use  . Smoking status: Former Smoker    Years: 40.00    Types: Pipe    Quit date: 02/28/1978    Years since quitting: 41.5  . Smokeless tobacco: Never Used  . Tobacco comment: smoked pipe only   Vaping Use  . Vaping Use: Never used  Substance and Sexual Activity  . Alcohol use: No    Alcohol/week: 0.0 standard drinks    Comment: rarely wine  . Drug use: No  . Sexual activity: Not on file

## 2019-09-05 ENCOUNTER — Ambulatory Visit: Payer: Medicare Other | Admitting: Physical Therapy

## 2019-11-06 ENCOUNTER — Telehealth: Payer: Self-pay | Admitting: Internal Medicine

## 2019-11-07 NOTE — Telephone Encounter (Signed)
Spoke with patient regarding prior message. Advised patient to call the company of the CPAP to get more information on the CPAP recall. Patient's voice was understanding. Nothing else further needed.

## 2019-12-20 ENCOUNTER — Other Ambulatory Visit: Payer: Self-pay | Admitting: Cardiology

## 2020-04-17 NOTE — Progress Notes (Signed)
HPI male former smoker followed for OSA, complicated by CAD, CHF, venous stasis dermatitis, chronic renal disease PFT: 12/16/2011-within normal limits. Small airway flows were quite normal but improved with bronchodilator. FEV1 3.03/107%, FEV1/FVC 0.82, DLCO 0.89. NPSG 12/25/91- AHI 19.3/ hr, desat to 66%, body weight 206 lbs ------------------------------------------------------------------------------------------------   04/15/19-  80 year old male former smoker followed for OSA, complicated by CAD/ CHF, chronic renal disease, dementia/ Alzheimers, GERD with esophagitis, Venous stasis dermatititis,  CPAP auto 5-15/Respicare ([place]) new machine in 2020 [place]  Daughter here with him Had THR L hip in 2020 Body weight today 191 lbs Some DOE, no acute events, little cough or wheeze. Denies cardiac issues. Says he uses CPAP, can't sleep without it.   04/20/20- 80 year old male former smoker followed for OSA, complicated by CAD/ CHF, chronic renal disease, Dementia/ Alzheimers, GERD with esophagitis, Venous stasis dermatititis,  CPAP auto 5-15/Respicare ([place]) new machine in 2020> recalled Download- using old machine- unable to download Body weight today- Covid vax-3 Moderna              Here with daughter Flu vax-had -----Patient feels like he is sleeping good, machine is working good, shortness of breath at rest worse with exertion, denies cough No concerns about his CPAP function. His DreamStation machine was recalled and he is using an old machine, but it works well enough, so he is waiting till the recall issuss are resolved.  - Problem DOE- gradually more DOE over past several years. He talks in terms of walking laps around his 300 foot driveway. Little cough or wheeze. No acute vent or chest pain. He will f/u with his cardiologist.  ROS-see HPI  + = positive Constitutional:   No-   weight loss, night sweats, fevers, chills, fatigue, lassitude. HEENT:   No-  headaches, difficulty  swallowing, tooth/dental problems, sore throat,       No-  sneezing, itching, ear ache, nasal congestion, post nasal drip,  CV:  No-   chest pain, orthopnea, PND, swelling in lower extremities, anasarca,                                           dizziness, palpitations Resp: + shortness of breath with exertion or at rest.              No-   productive cough,  No non-productive cough,  No- coughing up of blood.              No-   change in color of mucus.  No- wheezing.   Skin: No-   rash or lesions. GI:   GU:  MS:  No-   joint pain or swelling.   Neuro-     Memory decline- Alzheimers Psych:  No- change in mood or affect. No depression or anxiety.  + memory loss.  OBJ General- Alert, Oriented, Affect-appropriate, Distress- none acute. +Obese Skin- rash-none, lesions- none, excoriation- none Lymphadenopathy- none Head- atraumatic            Eyes- Gross vision intact, PERRLA, +Strabismus, conjunctivae clear secretions            Ears- +Hearing aids, + scarring both TMs            Nose- Clear, no-Septal dev, mucus, polyps, erosion, perforation             Throat- Mallampati IV , mucosa clear , drainage- none,  tonsils- atrophic Neck- flexible , trachea midline, no stridor , thyroid nl, carotid no bruit Chest - symmetrical excursion , unlabored           Heart/CV- RRR ,  Murmur+trace S , no gallop  , no rub, nl s1 s2                           - JVD- none , edema- none, stasis changes- none, varices- none           Lung- clear to P&A, wheeze- none, cough- none , dullness-none, rub- none           Chest wall-  Abd-  Br/ Gen/ Rectal- Not done, not indicated Extrem- cyanosis- none, clubbing, none, atrophy- none, strength- nl Neuro- grossly intact to observation

## 2020-04-20 ENCOUNTER — Ambulatory Visit (INDEPENDENT_AMBULATORY_CARE_PROVIDER_SITE_OTHER): Payer: Medicare Other | Admitting: Internal Medicine

## 2020-04-20 ENCOUNTER — Other Ambulatory Visit: Payer: Self-pay

## 2020-04-20 ENCOUNTER — Encounter: Payer: Self-pay | Admitting: Internal Medicine

## 2020-04-20 ENCOUNTER — Ambulatory Visit: Payer: Medicare Other | Admitting: Family Medicine

## 2020-04-20 ENCOUNTER — Telehealth: Payer: Self-pay

## 2020-04-20 VITALS — BP 140/66 | HR 79 | Temp 98.2°F | Ht 70.0 in | Wt 211.4 lb

## 2020-04-20 DIAGNOSIS — G4733 Obstructive sleep apnea (adult) (pediatric): Secondary | ICD-10-CM

## 2020-04-20 DIAGNOSIS — R0600 Dyspnea, unspecified: Secondary | ICD-10-CM | POA: Diagnosis not present

## 2020-04-20 DIAGNOSIS — R0609 Other forms of dyspnea: Secondary | ICD-10-CM

## 2020-04-20 NOTE — Telephone Encounter (Signed)
Patient has been rescheduled to Dr. Marlou Sa to discuss shoulder surgery.

## 2020-04-20 NOTE — Telephone Encounter (Signed)
I called the patient per Dr. Junius Roads' request: the patient is on the schedule for a surgical consultation for the shoulder this afternoon. Dr. Junius Roads is not a surgeon----it is possible he was scheduled with the wrong physician. I left a message asking the patient to call me back about this.

## 2020-04-20 NOTE — Patient Instructions (Signed)
Order- CXR   Dx dyspnea on exertion  Order- labs- CBC w diff, BMET, BNP   Dyspnea on exertion  You can continue using your CPAP machine as discussed- ok to use the filter you were sent.  Please call if we can help

## 2020-04-21 ENCOUNTER — Telehealth: Payer: Self-pay

## 2020-04-21 ENCOUNTER — Ambulatory Visit (INDEPENDENT_AMBULATORY_CARE_PROVIDER_SITE_OTHER): Payer: Medicare Other

## 2020-04-21 ENCOUNTER — Other Ambulatory Visit (INDEPENDENT_AMBULATORY_CARE_PROVIDER_SITE_OTHER): Payer: Medicare Other

## 2020-04-21 DIAGNOSIS — R0609 Other forms of dyspnea: Secondary | ICD-10-CM

## 2020-04-21 DIAGNOSIS — R0600 Dyspnea, unspecified: Secondary | ICD-10-CM | POA: Diagnosis not present

## 2020-04-21 LAB — BASIC METABOLIC PANEL
BUN: 26 mg/dL — ABNORMAL HIGH (ref 6–23)
CO2: 28 mEq/L (ref 19–32)
Calcium: 10.5 mg/dL (ref 8.4–10.5)
Chloride: 104 mEq/L (ref 96–112)
Creatinine, Ser: 1.17 mg/dL (ref 0.40–1.50)
GFR: 58.29 mL/min — ABNORMAL LOW (ref >60.00)
Glucose, Bld: 146 mg/dL — ABNORMAL HIGH (ref 70–99)
Potassium: 4.4 mEq/L (ref 3.5–5.1)
Sodium: 138 mEq/L (ref 135–145)

## 2020-04-21 LAB — CBC WITH DIFFERENTIAL/PLATELET
Basophils Absolute: 0.1 10*3/uL (ref 0.0–0.1)
Basophils Relative: 1.4 % (ref 0.0–3.0)
Eosinophils Absolute: 0.2 10*3/uL (ref 0.0–0.7)
Eosinophils Relative: 3.1 % (ref 0.0–5.0)
HCT: 40.2 % (ref 39.0–52.0)
Hemoglobin: 13.8 g/dL (ref 13.0–17.0)
Lymphocytes Relative: 25.5 % (ref 12.0–46.0)
Lymphs Abs: 1.4 10*3/uL (ref 0.7–4.0)
MCHC: 34.2 g/dL (ref 30.0–36.0)
MCV: 92.8 fl (ref 78.0–100.0)
Monocytes Absolute: 0.4 10*3/uL (ref 0.1–1.0)
Monocytes Relative: 7.1 % (ref 3.0–12.0)
Neutro Abs: 3.4 10*3/uL (ref 1.4–7.7)
Neutrophils Relative %: 62.9 % (ref 43.0–77.0)
Platelets: 211 10*3/uL (ref 150.0–400.0)
RBC: 4.34 Mil/uL (ref 4.22–5.81)
RDW: 13.1 % (ref 11.5–15.5)
WBC: 5.4 10*3/uL (ref 4.0–10.5)

## 2020-04-21 NOTE — Telephone Encounter (Signed)
LMTCB

## 2020-04-21 NOTE — Assessment & Plan Note (Signed)
Likely cardiovascular in nature Plan- CXR and labs for BMET, CBC w diff, BNP. He will make f/u with his cardiologist to check on dyspnea.

## 2020-04-21 NOTE — Telephone Encounter (Signed)
Patient returned phone call, confirmed DOB. Made aware of CXR results:    Voiced understanding. Nothing further is needed at this time.

## 2020-04-21 NOTE — Assessment & Plan Note (Signed)
Can't download old machine but he uses it every ight and feels he sleeps well. Plan- replace recalled Dream Station when his DME can arrange it.

## 2020-04-21 NOTE — Telephone Encounter (Signed)
-----   Message from Deneise Lever, MD sent at 04/21/2020  4:32 PM EST ----- CXR- stable no new or active process. There is atherosclerosis with calcium in arteries.

## 2020-04-22 ENCOUNTER — Other Ambulatory Visit: Payer: Self-pay | Admitting: Internal Medicine

## 2020-04-22 LAB — PRO B NATRIURETIC PEPTIDE: NT-Pro BNP: 100 pg/mL (ref 0–486)

## 2020-04-22 NOTE — Progress Notes (Signed)
Spoke with pt and notified of results per Dr. Young Pt verbalized understanding and denied any questions. 

## 2020-04-26 NOTE — Progress Notes (Signed)
[name] [name] Date of Birth: August 02, 1940   History of Present Illness: [name] [name] is seen today for followup of his coronary disease. He has a history of an intermediate stenosis in the mid LAD had a 70% by angiography. Previous nuclear stress test in 2008 was normal. He underwent cardiac catheterization in 2010 with flow wire evaluation of the LAD which was normal. He has been managed medically. He also has a history of OSA followed by [name] [name].   On follow up today he is doing well from a cardiac standpoint. He was admitted in January 2018 at [facility] with acute sepsis and Pseudomonas bacteremia. He had acute mental status changes with combativeness. He was febrile and tachypneic. CT of head, Abd, pelvis negative for acute findings. CT LE c/w cellulitis. Venous dopplers negative for DVT but he did have severe venous insufficiency. LE arterial dopplers were normal. Other history includes chronic renal disease and Alzheimers.   He had left THR in July 2020.   Last seen here  in June 2020. He was seen then with complaints of dizziness. He was not orthostatic but he had reported abnormal EKG with new LBBB. He was recommended to be seen by cardiology.  Myoview was done and was normal. Carotid dopplers were also normal.   On follow up today he notes he is SOB. Notes this when he bends over, when washing the dishes or even sitting at his computer. Remote history of smoking a pipe. PFTs in 2013 were normal. Seen by Dr Annamaria [name] recently. CBC and chemistries were OK. CXR no change. Patient denies any change in weight. No edema. No chest pain. Walks 1-1.5 miles daily.   Current Outpatient Medications on File Prior to Visit  Medication Sig Dispense Refill  . Ascorbic Acid (VITAMIN C) 1000 MG tablet Take 1,000 mg by mouth daily.    Marland Kitchen aspirin 81 MG chewable tablet Chew 1 tablet (81 mg total) by mouth 2 (two) times daily. 35 tablet 0  . calcium-vitamin D (OSCAL WITH D) 500-200 MG-UNIT per tablet Take 1 tablet  by mouth 2 (two) times daily.    Marland Kitchen docusate sodium (COLACE) 100 MG capsule Take 100 mg by mouth 2 (two) times daily.    Marland Kitchen donepezil (ARICEPT) 10 MG tablet Take 1 tablet by mouth daily.    . fesoterodine (TOVIAZ) 8 MG TB24 tablet Take by mouth.    . fluticasone (FLONASE) 50 MCG/ACT nasal spray Place 2 sprays into both nostrils daily.    . furosemide (LASIX) 20 MG tablet Take 20 mg by mouth every other day. 30 tablet 6  . lisinopril (ZESTRIL) 20 MG tablet Take 20 mg by mouth daily.    . metroNIDAZOLE (METROGEL) 1 % gel APPLY TOPICALLY D    . mirabegron ER (MYRBETRIQ) 25 MG TB24 tablet Take 25 mg by mouth daily.     . Multiple Vitamin (MULTIVITAMIN) tablet Take 1 tablet by mouth daily.    . Omega-3 Fatty Acids (FISH OIL) 600 MG CAPS Take 1 capsule by mouth 2 (two) times daily.    . Psyllium (METAMUCIL PO) Take 5 mLs by mouth daily.    . rosuvastatin (CRESTOR) 10 MG tablet TAKE 1/2 TABLET BY MOUTH EVERY OTHER DAY 30 tablet 1  . Saw Palmetto 450 MG CAPS Take 1 capsule by mouth 2 (two) times daily.      No current facility-administered medications on file prior to visit.    Allergies  Allergen Reactions  . Darifenacin  Other reaction(s): Mental Status Changes (intolerance) PT STATES IT MAKES HIM "BONKERS" AND VERY TIRED  . Enablex [Darifenacin Hydrobromide Er]     PT STATES IT MAKES HIM "BONKERS" AND VERY TIRED  . Ceftriaxone     Unknown reaction  . Atorvastatin     Muscle aches/weakness  . Ezetimibe     Other reaction(s): Other (See Comments) Muscle aches/weakness  . Levaquin [Levofloxacin] Diarrhea    Caused diarheer a   . Other     Intolerance to zetia and lipitor  . Oxybutynin Other (See Comments)    Memory impairment, temperament changes  . Sulfamethoxazole     Other reaction(s): Other (See Comments) Childhood allergy  . Sulfasalazine     Other reaction(s): Other (See Comments) Unknown per daughter  . Sulfonamide Derivatives     Past Medical History:  Diagnosis Date   . Alzheimer disease ([place])   . CHF (congestive heart failure) ([place])   . Chronic kidney disease    mild insuffiency  . Coronary artery disease    LHC 4/10: Mid LAD 40-50%, then 70%, OM1 20-30%, EF 65%. Mid LAD FFR 0.89 (not hemodynamically significant). Medical therapy was continued.  . Hyperlipidemia   . LBBB (left bundle branch block) 06/2018  . Obstructive sleep apnea     Past Surgical History:  Procedure Laterality Date  . CARDIAC CATHETERIZATION  05/02/2003   single vessel,moderate stenosis mid LAD  . CARDIAC CATHETERIZATION  06/12/2008   continue med. therapy  . right shoulder surgery    . TOTAL HIP ARTHROPLASTY Left 09/14/2018   Procedure: LEFT TOTAL HIP ARTHROPLASTY ANTERIOR APPROACH;  Surgeon: [name] Rossetti, MD;  Location: [facility];  Service: Orthopedics;  Laterality: Left;    Social History   Tobacco Use  Smoking Status Former Smoker  . Years: 40.00  . Types: Pipe  . Quit date: 02/28/1990  . Years since quitting: 30.1  Smokeless Tobacco Never Used  Tobacco Comment   smoked pipe only     Social History   Substance and Sexual Activity  Alcohol Use No  . Alcohol/week: 0.0 standard drinks   Comment: rarely wine    Family History  Problem Relation Age of Onset  . Cancer Mother   . Hypertension Mother   . Other Father        old age  . Hypertension Father     Review of Systems: As noted in HPI. All other systems were reviewed and are negative.  Physical Exam: BP 140/70   Pulse 74   Ht 5\' 10"  (1.778 m)   Wt 207 lb 9.6 oz (94.2 kg)   SpO2 95%   BMI 29.79 kg/m  He is an overweight WM in NAD.  The HEENT exam  is normal.  The carotids are 2+ without bruits.  There is no thyromegaly.  There is no JVD.  The lungs are clear.   The heart exam reveals a regular rate with a normal S1 and S2.  There are no murmurs, gallops, or rubs.  The PMI is not displaced.   Abdominal exam reveals good bowel sounds.  Obese.  There is no hepatosplenomegaly or tenderness.   There are no masses.  Chronic hyperpigmentation in LE , no edema.   The distal pulses are intact.  Cranial nerves II - XII are intact.   LABORATORY DATA: Lab Results  Component Value Date   WBC 5.4 04/21/2020   HGB 13.8 04/21/2020   HCT 40.2 04/21/2020   PLT 211.0 04/21/2020  GLUCOSE 146 (H) 04/21/2020   ALT 41 11/08/2015   AST 38 11/08/2015   NA 138 04/21/2020   K 4.4 04/21/2020   CL 104 04/21/2020   CREATININE 1.17 04/21/2020   BUN 26 (H) 04/21/2020   CO2 28 04/21/2020   TSH 3.437 08/24/2014   INR 1.0 04/01/2008    Labs reviewed from primary care dated 05/18/15: normal chemistry panel, TSH, CBC. Cholesterol 166, trig- 109, HDL 44, LDL 100 Dated 08/22/16: glucose 136. Otherwise CMET normal Dated 07/07/16: Cholesterol 140, triglycerides 123, HDL 38, LDL 77. CBC and TSH normal. Dated 08/22/16: glucose 136, other CMET normal.    Myoview 07/20/18: Study Highlights   Nuclear stress EF: 63%. The left ventricular ejection fraction is normal (55-65%).  There was no ST segment deviation noted during stress.  The study is normal.  This is a low risk study.    Assessment / Plan:  1. Coronary disease with borderline LAD stenosis. Negative ischemic evaluation the past. Myoview normal in May 2020. We will continue medical management.  2. Hyperlipidemia. On statin. Unable to tolerate higher doses. Last lipids looked very good.  3. SOB. No clinical signs of CHF. I suspect this is more of a conditioning issue. We will check an Echo to assess cardiac function  4. Memory loss/ dementia  5. Chronic venous insufficiency. Continue support hose, salt restriction.

## 2020-04-29 ENCOUNTER — Encounter: Payer: Self-pay | Admitting: Cardiology

## 2020-04-29 ENCOUNTER — Other Ambulatory Visit: Payer: Self-pay

## 2020-04-29 ENCOUNTER — Ambulatory Visit (INDEPENDENT_AMBULATORY_CARE_PROVIDER_SITE_OTHER): Payer: Medicare Other | Admitting: Cardiology

## 2020-04-29 ENCOUNTER — Encounter (HOSPITAL_COMMUNITY): Payer: Self-pay | Admitting: Physician Assistant

## 2020-04-29 VITALS — BP 140/70 | HR 74 | Ht 70.0 in | Wt 207.6 lb

## 2020-04-29 DIAGNOSIS — E7800 Pure hypercholesterolemia, unspecified: Secondary | ICD-10-CM | POA: Diagnosis not present

## 2020-04-29 DIAGNOSIS — I2510 Atherosclerotic heart disease of native coronary artery without angina pectoris: Secondary | ICD-10-CM

## 2020-04-29 DIAGNOSIS — R0602 Shortness of breath: Secondary | ICD-10-CM

## 2020-04-29 NOTE — Patient Instructions (Signed)
Medication Instructions:  Continue same medications *If you need a refill on your cardiac medications before your next appointment, please call your pharmacy*   Lab Work: None ordered   Testing/Procedures: Echo   Follow-Up: At [facility], you and your health needs are our priority.  As part of our continuing mission to provide you with exceptional heart care, we have created designated Provider Care Teams.  These Care Teams include your primary Cardiologist (physician) and Advanced Practice Providers (APPs -  Physician Assistants and Nurse Practitioners) who all work together to provide you with the care you need, when you need it.  We recommend signing up for the patient portal called "MyChart".  Sign up information is provided on this After Visit Summary.  MyChart is used to connect with patients for Virtual Visits (Telemedicine).  Patients are able to view lab/test results, encounter notes, upcoming appointments, etc.  Non-urgent messages can be sent to your provider as well.   To learn more about what you can do with MyChart, go to [email or web address].    Your next appointment:  6 months   Call in July to schedule Sept appointment   The format for your next appointment: Office    Provider: Dr.Jordan

## 2020-05-06 ENCOUNTER — Other Ambulatory Visit: Payer: Self-pay

## 2020-05-06 ENCOUNTER — Encounter: Payer: Self-pay | Admitting: Orthopedic Surgery

## 2020-05-06 ENCOUNTER — Ambulatory Visit (INDEPENDENT_AMBULATORY_CARE_PROVIDER_SITE_OTHER): Payer: Medicare Other | Admitting: Orthopedic Surgery

## 2020-05-06 DIAGNOSIS — G8929 Other chronic pain: Secondary | ICD-10-CM

## 2020-05-06 DIAGNOSIS — M19011 Primary osteoarthritis, right shoulder: Secondary | ICD-10-CM

## 2020-05-06 DIAGNOSIS — M25511 Pain in right shoulder: Secondary | ICD-10-CM

## 2020-05-06 NOTE — Progress Notes (Signed)
Office Visit Note   Patient: [name] [name]           Date of Birth: 1940/05/27           MRN: [id number] Visit Date: 05/06/2020 Requested by: Chesley Noon, MD [place],  [place] [id number] PCP: Chesley Noon, MD  Subjective: Chief Complaint  Patient presents with  . Right Shoulder - Pain    HPI: [name] [name] is an 80 year old patient with right shoulder pain.  Has had pain for 1-1/2 years.  Had an injury about 30 years ago.  Here with his daughter who is his caregiver.  Patient reports episodic pain.  Some pain posterior around the scapula is present on the right-hand side as well.  Certain movements cause pain but he does not have pain all the time.  He did well with hip replacement surgery 2 years ago.  Had right shoulder arthroscopy and distal clavicle excision in 2008.  Had an injection June 21 of last year which gave him marginal relief.  He is able to sleep on that right-hand side.  He also has Alzheimer's and cardiac history.              ROS: All systems reviewed are negative as they relate to the chief complaint within the history of present illness.  Patient denies  fevers or chills.   Assessment & Plan: Visit Diagnoses:  1. Chronic right shoulder pain   2. Primary osteoarthritis, right shoulder     Plan: Impression is radiographically impressive right shoulder arthritis but clinically the patient is doing reasonably well.  I do not think the benefits of surgery outweigh the risk at this time.  I would favor intra-articular fluoroscopically guided right shoulder injection with Dr. Ernestina Patches about twice a year to 3 times a year.  We will get that arranged.  Continue with shoulder range of motion exercises.  Follow-up as needed.  Follow-Up Instructions: No follow-ups on file.   Orders:  Orders Placed This Encounter  Procedures  . Ambulatory referral to Physical Medicine Rehab   No orders of the defined types were placed in this encounter.      Procedures: No procedures performed   Clinical Data: No additional findings.  Objective: Vital Signs: There were no vitals taken for this visit.  Physical Exam:   Constitutional: Patient appears well-developed HEENT:  Head: Normocephalic Eyes:EOM are normal Neck: Normal range of motion Cardiovascular: Normal rate Pulmonary/chest: Effort normal Neurologic: Patient is alert Skin: Skin is warm Psychiatric: Patient has normal mood and affect    Ortho Exam: Ortho exam demonstrates active forward flexion and abduction of the right shoulder just above 90 degrees with AB duction and about 120 with forward flexion.  Rotator cuff strength is excellent on the right infraspinatus supraspinatus subscap muscle testing.  Shoulder range of motion passively is 4590 and 150.  Motor sensory function to the hand is intact.  Specialty Comments:  No specialty comments available.  Imaging: No results found.   PMFS History: Patient Active Problem List   Diagnosis Date Noted  . 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  . Primary osteoarthritis of left hip 09/06/2018  . Alzheimer's dementia ([place]) 12/07/2016  . 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  . 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  . Venous stasis dermatitis of left lower extremity 04/07/2016  . Bilateral impacted cerumen 01/18/2016  . 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  . Unsteadiness 01/18/2016  . Chronic venous stasis dermatitis  10/22/2015  . Cellulitis of left lower leg   . Left leg swelling   . Cellulitis 08/23/2014  . Chronic congestive heart failure ([place]) 04/29/2014  . DOE (dyspnea on exertion) 11/12/2011  . Chronic kidney disease, stage II (mild)   . Thrombocytopenia, unspecified ([place]) 07/29/2010  . Vitamin D deficiency 05/19/2010  . 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 Urinary urgency 05/19/2010  . 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 Benign  essential hypertension 12/23/2009  . Actinic keratosis 12/23/2009  . Seborrheic keratosis 03/18/2009  . Benign neoplasm 03/18/2009  . Other and unspecified hyperlipidemia 01/18/2008  . Obstructive sleep apnea 01/18/2008  . Coronary atherosclerosis 01/18/2008  . Atherosclerotic heart disease of native coronary artery without angina pectoris 01/18/2008  . 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 Unspecified protein-calorie malnutrition ([place]) 09/27/2006  . Gastro-esophageal reflux disease with esophagitis 09/15/2006  . 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 CAD (coronary artery disease), native coronary artery 05/11/2005   Past Medical History:  Diagnosis Date  . Alzheimer disease ([place])   . CHF (congestive heart failure) ([place])   . Chronic kidney disease    mild insuffiency  . Coronary artery disease    LHC 4/10: Mid LAD 40-50%, then 70%, OM1 20-30%, EF 65%. Mid LAD FFR 0.89 (not hemodynamically significant). Medical therapy was continued.  . Hyperlipidemia   . LBBB (left bundle branch block) 06/2018  . Obstructive sleep apnea     Family History  Problem Relation Age of Onset  . Cancer Mother   . Hypertension Mother   . Other Father        old age  . Hypertension Father     Past Surgical History:  Procedure Laterality Date  . CARDIAC CATHETERIZATION  05/02/2003   single vessel,moderate stenosis mid LAD  . CARDIAC CATHETERIZATION  06/12/2008   continue med. therapy  . right shoulder surgery    . TOTAL HIP ARTHROPLASTY Left 09/14/2018   Procedure: LEFT TOTAL HIP ARTHROPLASTY ANTERIOR APPROACH;  Surgeon: Mcarthur Rossetti, MD;  Location: [facility];  Service: Orthopedics;  Laterality: Left;   Social History   Occupational History  . Occupation: [employer]  Tobacco Use  . Smoking status: Former Smoker    Years: 40.00    Types: Pipe    Quit date: 02/28/1990    Years since quitting: 30.2  . Smokeless tobacco: Never Used  .  Tobacco comment: smoked pipe only   Vaping Use  . Vaping Use: Never used  Substance and Sexual Activity  . Alcohol use: No    Alcohol/week: 0.0 standard drinks    Comment: rarely wine  . Drug use: No  . Sexual activity: Not on file

## 2020-05-11 ENCOUNTER — Other Ambulatory Visit (HOSPITAL_BASED_OUTPATIENT_CLINIC_OR_DEPARTMENT_OTHER): Payer: Medicare Other | Admitting: Internal Medicine

## 2020-05-22 ENCOUNTER — Other Ambulatory Visit: Payer: Self-pay

## 2020-05-22 ENCOUNTER — Ambulatory Visit (HOSPITAL_COMMUNITY): Payer: Medicare Other | Attending: Cardiology

## 2020-05-22 DIAGNOSIS — E7800 Pure hypercholesterolemia, unspecified: Secondary | ICD-10-CM | POA: Diagnosis not present

## 2020-05-22 DIAGNOSIS — I2510 Atherosclerotic heart disease of native coronary artery without angina pectoris: Secondary | ICD-10-CM

## 2020-05-22 DIAGNOSIS — R0602 Shortness of breath: Secondary | ICD-10-CM | POA: Diagnosis not present

## 2020-05-22 LAB — ECHOCARDIOGRAM COMPLETE
Area-P 1/2: 2.99 cm2
P 1/2 time: 356 msec
S' Lateral: 2.6 cm

## 2020-05-22 MED ORDER — PERFLUTREN LIPID MICROSPHERE
1.0000 mL | INTRAVENOUS | Status: AC | PRN
  Administered 2020-05-22: 2 mL via INTRAVENOUS

## 2020-05-27 ENCOUNTER — Other Ambulatory Visit (HOSPITAL_COMMUNITY): Payer: Medicare Other

## 2020-06-01 ENCOUNTER — Encounter: Payer: Self-pay | Admitting: Physical Medicine and Rehabilitation

## 2020-06-01 ENCOUNTER — Ambulatory Visit: Payer: Self-pay

## 2020-06-01 ENCOUNTER — Ambulatory Visit (INDEPENDENT_AMBULATORY_CARE_PROVIDER_SITE_OTHER): Payer: Medicare Other | Admitting: Physical Medicine and Rehabilitation

## 2020-06-01 ENCOUNTER — Other Ambulatory Visit: Payer: Self-pay

## 2020-06-01 DIAGNOSIS — S96912A Strain of unspecified muscle and tendon at ankle and foot level, left foot, initial encounter: Secondary | ICD-10-CM

## 2020-06-01 DIAGNOSIS — M79672 Pain in left foot: Secondary | ICD-10-CM | POA: Diagnosis not present

## 2020-06-01 DIAGNOSIS — M25511 Pain in right shoulder: Secondary | ICD-10-CM | POA: Diagnosis not present

## 2020-06-01 DIAGNOSIS — G8929 Other chronic pain: Secondary | ICD-10-CM

## 2020-06-01 MED ORDER — BUPIVACAINE HCL 0.5 % IJ SOLN
3.0000 mL | INTRAMUSCULAR | Status: AC | PRN
  Administered 2020-06-01: 3 mL via INTRA_ARTICULAR

## 2020-06-01 MED ORDER — TRIAMCINOLONE ACETONIDE 40 MG/ML IJ SUSP
40.0000 mg | INTRAMUSCULAR | Status: AC | PRN
  Administered 2020-06-01: 40 mg via INTRA_ARTICULAR

## 2020-06-01 NOTE — Progress Notes (Signed)
   [name] [name] - 80 y.o. male MRN [id number]  Date of birth: 1941-02-26  Office Visit Note: Visit Date: 06/01/2020 PCP: Chesley Noon, MD Referred by: Chesley Noon, MD  Subjective: Chief Complaint  Patient presents with  . Right Shoulder - Pain   HPI:  [name] [name] is a 80 y.o. male who comes in today at the request of Dr. Anderson Malta for planned Right anesthetic glenohumeral arthrogram with fluoroscopic guidance.  The patient has failed conservative care including home exercise, medications, time and activity modification.  This injection will be diagnostic and hopefully therapeutic.  Please see requesting physician notes for further details and justification.   ROS Otherwise per HPI.  Assessment & Plan: Visit Diagnoses:    ICD-10-CM   1. Chronic right shoulder pain  M25.511 Large Joint Inj: R glenohumeral   G89.29 XR C-ARM NO REPORT  2. Acute foot pain, left  M79.672   3. Muscle strain of left foot, initial encounter  [phone]     Plan: No additional findings.   Meds & Orders: No orders of the defined types were placed in this encounter.   Orders Placed This Encounter  Procedures  . Large Joint Inj: R glenohumeral  . XR C-ARM NO REPORT    Follow-up: Return if symptoms worsen or fail to improve.   Procedures: Large Joint Inj: R glenohumeral on 06/01/2020 11:01 AM Indications: pain and diagnostic evaluation Details: 22 G 3.5 in needle, fluoroscopy-guided anteromedial approach  Arthrogram: No  Medications: 3 mL bupivacaine 0.5 %; 40 mg triamcinolone acetonide 40 MG/ML Outcome: tolerated well, no immediate complications  There was excellent flow of contrast producing a partial arthrogram of the glenohumeral joint.  The patient seemed to have some mild relief during the anesthetic phase and did have some increased range of motion. Procedure, treatment alternatives, risks and benefits explained, specific risks discussed. Consent was given by the patient.  Immediately prior to procedure a time out was called to verify the correct patient, procedure, equipment, support staff and site/side marked as required. Patient was prepped and draped in the usual sterile fashion.          Clinical History: No specialty comments available.     Objective:  VS:  HT:    WT:   BMI:     BP:   HR: bpm  TEMP: ( )  RESP:  Physical Exam   Imaging: XR C-ARM NO REPORT  Result Date: 06/01/2020 Please see Notes tab for imaging impression.

## 2020-06-01 NOTE — Progress Notes (Signed)
Pt state right shoulder and under his right arm. Pt state lifting right shoulder makes the pain worse.Pt state he take pain meds to help ease his pain.  Numeric Pain Rating Scale and Functional Assessment Average Pain 2   In the last MONTH (on 0-10 scale) has pain interfered with the following?  1. General activity like being  able to carry out your everyday physical activities such as walking, climbing stairs, carrying groceries, or moving a chair?  Rating(8)

## 2020-06-21 ENCOUNTER — Other Ambulatory Visit: Payer: Self-pay | Admitting: Cardiology

## 2020-07-15 ENCOUNTER — Emergency Department (HOSPITAL_BASED_OUTPATIENT_CLINIC_OR_DEPARTMENT_OTHER)
Admission: EM | Admit: 2020-07-15 | Discharge: 2020-07-15 | Disposition: A | Discharge status: Home / Self Care | Payer: Medicare Other | Attending: Emergency Medicine | Admitting: Emergency Medicine

## 2020-07-15 ENCOUNTER — Encounter (HOSPITAL_BASED_OUTPATIENT_CLINIC_OR_DEPARTMENT_OTHER): Payer: Self-pay

## 2020-07-15 ENCOUNTER — Other Ambulatory Visit: Payer: Self-pay

## 2020-07-15 DIAGNOSIS — Z7982 Long term (current) use of aspirin: Secondary | ICD-10-CM | POA: Diagnosis not present

## 2020-07-15 DIAGNOSIS — Z87891 Personal history of nicotine dependence: Secondary | ICD-10-CM | POA: Insufficient documentation

## 2020-07-15 DIAGNOSIS — Z96642 Presence of left artificial hip joint: Secondary | ICD-10-CM | POA: Diagnosis not present

## 2020-07-15 DIAGNOSIS — R220 Localized swelling, mass and lump, head: Secondary | ICD-10-CM | POA: Diagnosis present

## 2020-07-15 DIAGNOSIS — K1120 Sialoadenitis, unspecified: Secondary | ICD-10-CM | POA: Diagnosis not present

## 2020-07-15 DIAGNOSIS — Z79899 Other long term (current) drug therapy: Secondary | ICD-10-CM | POA: Diagnosis not present

## 2020-07-15 DIAGNOSIS — I509 Heart failure, unspecified: Secondary | ICD-10-CM | POA: Diagnosis not present

## 2020-07-15 DIAGNOSIS — I130 Hypertensive heart and chronic kidney disease with heart failure and stage 1 through stage 4 chronic kidney disease, or unspecified chronic kidney disease: Secondary | ICD-10-CM | POA: Diagnosis not present

## 2020-07-15 DIAGNOSIS — N182 Chronic kidney disease, stage 2 (mild): Secondary | ICD-10-CM | POA: Diagnosis not present

## 2020-07-15 DIAGNOSIS — I2510 Atherosclerotic heart disease of native coronary artery without angina pectoris: Secondary | ICD-10-CM | POA: Insufficient documentation

## 2020-07-15 MED ORDER — AMOXICILLIN-POT CLAVULANATE 875-125 MG PO TABS: 1.0000 | ORAL_TABLET | Freq: Two times a day (BID) | ORAL | 0 refills | Status: AC

## 2020-07-15 NOTE — Discharge Instructions (Addendum)
Your parotid gland appears inflamed and probably infected.  I am giving you antibiotics to take.  You will need to follow-up with your primary care physician to ensure that this goes away.  If at any point it seems worse or you have trouble breathing, swallowing, or develop a fever or any other new/concerning symptoms then return to the ER for evaluation.

## 2020-07-15 NOTE — ED Provider Notes (Signed)
[facility] EMERGENCY DEPT Provider Note   CSN: [id number] Arrival date & time: 07/15/20  1435     History Chief Complaint  Patient presents with  . Facial Swelling    [name] [name] is a 80 y.o. male.  HPI 80 year old male presents with acute left-sided facial swelling.  Started this morning.  Is not particularly painful at rest but if he touches it it is tender.  No dental pain.  No fevers.  He is not having any ear pain, trouble breathing or swallowing, or saliva problems.  Past Medical History:  Diagnosis Date  . Alzheimer disease ([place])   . CHF (congestive heart failure) ([place])   . Chronic kidney disease    mild insuffiency  . Coronary artery disease    LHC 4/10: Mid LAD 40-50%, then 70%, OM1 20-30%, EF 65%. Mid LAD FFR 0.89 (not hemodynamically significant). Medical therapy was continued.  . Hyperlipidemia   . LBBB (left bundle branch block) 06/2018  . Obstructive sleep apnea     Patient Active Problem List   Diagnosis Date Noted  . 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  . Primary osteoarthritis of left hip 09/06/2018  . Alzheimer's dementia ([place]) 12/07/2016  . 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  . 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  . Venous stasis dermatitis of left lower extremity 04/07/2016  . Bilateral impacted cerumen 01/18/2016  . 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  . Unsteadiness 01/18/2016  . 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  . Cellulitis of left lower leg   . Left leg swelling   . Cellulitis 08/23/2014  . Chronic congestive heart failure ([place]) 04/29/2014  . DOE (dyspnea on exertion) 11/12/2011  . Chronic kidney disease, stage II (mild)   . Thrombocytopenia, unspecified ([place]) 07/29/2010  . Vitamin D deficiency 05/19/2010  . Depression, major, recurrent, mild ([place]) 05/19/2010  . Urinary urgency 05/19/2010  . 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 Benign essential  hypertension 12/23/2009  . Actinic keratosis 12/23/2009  . Seborrheic keratosis 03/18/2009  . Benign neoplasm 03/18/2009  . Other and unspecified hyperlipidemia 01/18/2008  . Obstructive sleep apnea 01/18/2008  . Coronary atherosclerosis 01/18/2008  . Atherosclerotic heart disease of native coronary artery without angina pectoris 01/18/2008  . 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 Unspecified protein-calorie malnutrition ([place]) 09/27/2006  . Gastro-esophageal reflux disease with esophagitis 09/15/2006  . 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 CAD (coronary artery disease), native coronary artery 05/11/2005    Past Surgical History:  Procedure Laterality Date  . CARDIAC CATHETERIZATION  05/02/2003   single vessel,moderate stenosis mid LAD  . CARDIAC CATHETERIZATION  06/12/2008   continue med. therapy  . right shoulder surgery    . TOTAL HIP ARTHROPLASTY Left 09/14/2018   Procedure: LEFT TOTAL HIP ARTHROPLASTY ANTERIOR APPROACH;  Surgeon: Mcarthur Rossetti, MD;  Location: [facility];  Service: Orthopedics;  Laterality: Left;       Family History  Problem Relation Age of Onset  . Cancer Mother   . Hypertension Mother   . Other Father        old age  . Hypertension Father     Social History   Tobacco Use  . Smoking status: Former Smoker    Years: 40.00    Types: Pipe    Quit date: 02/28/1990    Years since quitting: 30.3  . Smokeless tobacco: Never Used  . Tobacco comment: smoked pipe only   Vaping Use  .  Vaping Use: Never used  Substance Use Topics  . Alcohol use: No    Alcohol/week: 0.0 standard drinks    Comment: rarely wine  . Drug use: No    Home Medications Prior to Admission medications   Medication Sig Start Date End Date Taking? Authorizing Provider  amoxicillin-clavulanate (AUGMENTIN) 875-125 MG tablet Take 1 tablet by mouth 2 (two) times daily for 7 days. One po bid x 7 days 07/15/20 07/22/20 Yes Sherwood Gambler,  MD  rosuvastatin (CRESTOR) 10 MG tablet TAKE 1/2 TABLET BY MOUTH EVERY OTHER DAY 06/22/20   Martinique, Peter M, MD  Ascorbic Acid (VITAMIN C) 1000 MG tablet Take 1,000 mg by mouth daily.    [provider]  aspirin 81 MG chewable tablet Chew 1 tablet (81 mg total) by mouth 2 (two) times daily. 09/16/18   Pete Pelt, PA-C  calcium-vitamin D (OSCAL WITH D) 500-200 MG-UNIT per tablet Take 1 tablet by mouth 2 (two) times daily.    [provider]  docusate sodium (COLACE) 100 MG capsule Take 100 mg by mouth 2 (two) times daily.    [provider]  donepezil (ARICEPT) 10 MG tablet Take 1 tablet by mouth daily. 12/01/16   [provider]  fesoterodine (TOVIAZ) 8 MG TB24 tablet Take by mouth. 12/13/18   [provider]  fluticasone (FLONASE) 50 MCG/ACT nasal spray Place 2 sprays into both nostrils daily. 05/15/17   [provider]  furosemide (LASIX) 20 MG tablet Take 20 mg by mouth every other day. 04/29/14   [place], Peter M, MD  lisinopril (ZESTRIL) 20 MG tablet Take 20 mg by mouth daily. 08/23/18   [provider]  metroNIDAZOLE (METROGEL) 1 % gel APPLY TOPICALLY D 01/15/19   [provider]  mirabegron ER (MYRBETRIQ) 25 MG TB24 tablet Take 25 mg by mouth daily.  08/29/18   [provider]  Multiple Vitamin (MULTIVITAMIN) tablet Take 1 tablet by mouth daily.    [provider]  Omega-3 Fatty Acids (FISH OIL) 600 MG CAPS Take 1 capsule by mouth 2 (two) times daily.    [provider]  Psyllium (METAMUCIL PO) Take 5 mLs by mouth daily.    [provider]  Saw Palmetto 450 MG CAPS Take 1 capsule by mouth 2 (two) times daily.     [provider]    Allergies    Darifenacin, Enablex [darifenacin hydrobromide er], Ceftriaxone, Atorvastatin, Ezetimibe, Levaquin [levofloxacin], Other, Oxybutynin, Sulfamethoxazole, Sulfasalazine, and Sulfonamide derivatives  Review of Systems   Review of Systems   Constitutional: Negative for fever.  HENT: Positive for facial swelling. Negative for trouble swallowing.   Respiratory: Negative for shortness of breath.     Physical Exam Updated Vital Signs BP (!) 159/81   Pulse 64   Temp 98.3 F (36.8 C) (Oral)   Resp 20   Ht 5\' 10"  (1.778 m)   Wt 95.3 kg   SpO2 100%   BMI 30.13 kg/m   Physical Exam Vitals and nursing note reviewed.  Constitutional:      Appearance: He is well-developed.  HENT:     Head: Normocephalic and atraumatic.      Right Ear: External ear normal.     Left Ear: External ear normal.     Nose: Nose normal.     Mouth/Throat:     Comments: No acute obvious dental infection or tenderness. No intra-oral swelling Eyes:     General:        Right eye:  No discharge.        Left eye: No discharge.  Cardiovascular:     Rate and Rhythm: Normal rate and regular rhythm.     Heart sounds: Normal heart sounds.  Pulmonary:     Effort: Pulmonary effort is normal.     Breath sounds: Normal breath sounds.  Abdominal:     General: There is no distension.  Musculoskeletal:     Cervical back: Neck supple.  Skin:    General: Skin is warm and dry.  Neurological:     Mental Status: He is alert.  Psychiatric:        Mood and Affect: Mood is not anxious.     ED Results / Procedures / Treatments   Labs (all labs ordered are listed, but only abnormal results are displayed) Labs Reviewed - No data to display  EKG None  Radiology No results found.  Procedures Procedures   Medications Ordered in ED Medications - No data to display  ED Course  I have reviewed the triage vital signs and the nursing notes.  Pertinent labs & imaging results that were available during my care of the patient were reviewed by me and considered in my medical decision making (see chart for details).    MDM Rules/Calculators/A&P                          Presentation is most consistent with parotiditis.  He otherwise appears well besides  some hypertension.  He is afebrile.  No signs of any other type of swelling and I doubt this is an allergic reaction.  At this point, he appears stable for discharge home with oral pain control at home with over-the-counter meds as well as a prescription for Augmentin.  I discussed the importance of following up with PCP to make sure this improves grossly may need further imaging/work-up.  However I do not think he needs blood work or imaging now.  His chart notes that reaction to ceftriaxone but he states penicillins are okay. Final Clinical Impression(s) / ED Diagnoses Final diagnoses:  Parotiditis    Rx / DC Orders ED Discharge Orders         Ordered    amoxicillin-clavulanate (AUGMENTIN) 875-125 MG tablet  2 times daily        07/15/20 1532           Sherwood Gambler, MD 07/15/20 1534

## 2020-07-15 NOTE — ED Triage Notes (Signed)
Woke up this morning with swelling, tenderness to left side of face anterior to left ear.

## 2020-07-19 ENCOUNTER — Encounter (HOSPITAL_BASED_OUTPATIENT_CLINIC_OR_DEPARTMENT_OTHER): Payer: Self-pay

## 2020-07-19 ENCOUNTER — Emergency Department (HOSPITAL_BASED_OUTPATIENT_CLINIC_OR_DEPARTMENT_OTHER): Payer: Medicare Other

## 2020-07-19 ENCOUNTER — Other Ambulatory Visit: Payer: Self-pay

## 2020-07-19 ENCOUNTER — Emergency Department (HOSPITAL_BASED_OUTPATIENT_CLINIC_OR_DEPARTMENT_OTHER)
Admission: EM | Admit: 2020-07-19 | Discharge: 2020-07-19 | Disposition: A | Discharge status: Home / Self Care | Payer: Medicare Other | Attending: Emergency Medicine | Admitting: Emergency Medicine

## 2020-07-19 DIAGNOSIS — R220 Localized swelling, mass and lump, head: Secondary | ICD-10-CM | POA: Insufficient documentation

## 2020-07-19 DIAGNOSIS — I130 Hypertensive heart and chronic kidney disease with heart failure and stage 1 through stage 4 chronic kidney disease, or unspecified chronic kidney disease: Secondary | ICD-10-CM | POA: Insufficient documentation

## 2020-07-19 DIAGNOSIS — Z7982 Long term (current) use of aspirin: Secondary | ICD-10-CM | POA: Diagnosis not present

## 2020-07-19 DIAGNOSIS — I2510 Atherosclerotic heart disease of native coronary artery without angina pectoris: Secondary | ICD-10-CM | POA: Diagnosis not present

## 2020-07-19 DIAGNOSIS — I509 Heart failure, unspecified: Secondary | ICD-10-CM | POA: Diagnosis not present

## 2020-07-19 DIAGNOSIS — Z87891 Personal history of nicotine dependence: Secondary | ICD-10-CM | POA: Diagnosis not present

## 2020-07-19 DIAGNOSIS — K1120 Sialoadenitis, unspecified: Secondary | ICD-10-CM

## 2020-07-19 DIAGNOSIS — N189 Chronic kidney disease, unspecified: Secondary | ICD-10-CM | POA: Insufficient documentation

## 2020-07-19 DIAGNOSIS — Z96642 Presence of left artificial hip joint: Secondary | ICD-10-CM | POA: Insufficient documentation

## 2020-07-19 DIAGNOSIS — Z79899 Other long term (current) drug therapy: Secondary | ICD-10-CM | POA: Insufficient documentation

## 2020-07-19 LAB — BASIC METABOLIC PANEL
Anion gap: 7 (ref 5–15)
BUN: 20 mg/dL (ref 8–23)
CO2: 28 mmol/L (ref 22–32)
Calcium: 9.8 mg/dL (ref 8.9–10.3)
Chloride: 104 mmol/L (ref 98–111)
Creatinine, Ser: 1.04 mg/dL (ref 0.61–1.24)
GFR, Estimated: >60 mL/min (ref >60)
Glucose, Bld: 100 mg/dL — ABNORMAL HIGH (ref 70–99)
Potassium: 4.3 mmol/L (ref 3.5–5.1)
Sodium: 139 mmol/L (ref 135–145)

## 2020-07-19 LAB — CBC WITH DIFFERENTIAL/PLATELET
Abs Immature Granulocytes: 0.02 10*3/uL (ref 0.00–0.07)
Basophils Absolute: 0 10*3/uL (ref 0.0–0.1)
Basophils Relative: 1 %
Eosinophils Absolute: 0.2 10*3/uL (ref 0.0–0.5)
Eosinophils Relative: 4 %
HCT: 41.4 % (ref 39.0–52.0)
Hemoglobin: 13.6 g/dL (ref 13.0–17.0)
Immature Granulocytes: 1 %
Lymphocytes Relative: 26 %
Lymphs Abs: 1.1 10*3/uL (ref 0.7–4.0)
MCH: 30.7 pg (ref 26.0–34.0)
MCHC: 32.9 g/dL (ref 30.0–36.0)
MCV: 93.5 fL (ref 80.0–100.0)
Monocytes Absolute: 0.4 10*3/uL (ref 0.1–1.0)
Monocytes Relative: 9 %
Neutro Abs: 2.6 10*3/uL (ref 1.7–7.7)
Neutrophils Relative %: 59 %
Platelets: 223 10*3/uL (ref 150–400)
RBC: 4.43 MIL/uL (ref 4.22–5.81)
RDW: 12.9 % (ref 11.5–15.5)
WBC: 4.3 10*3/uL (ref 4.0–10.5)
nRBC: 0 % (ref 0.0–0.2)

## 2020-07-19 NOTE — ED Notes (Signed)
Patient given water per permission from MD.

## 2020-07-19 NOTE — ED Triage Notes (Addendum)
Pt with swelling and tenderness to the L side of face anterior to L ear x 5 days. Pt was started on abx and is not getting any better.

## 2020-07-19 NOTE — ED Provider Notes (Signed)
  Physical Exam  BP (!) 172/88 (BP Location: Right Arm)   Pulse 71   Temp 97.9 F (36.6 C) (Oral)   Resp 18   Ht 5\' 10"  (1.778 m)   Wt 95.3 kg   SpO2 96%   BMI 30.13 kg/m   Physical Exam  ED Course/Procedures     Procedures  MDM    Received care of patient from Dr. Kathrynn Humble at 3 PM.  Please see his note for prior history, physical and care.  Briefly, this is an 80 year old male who was recently diagnosed with parotitis and started on antibiotics who presents with concern for persistent symptoms.  CT is pending.  Due to global IV contrast shortage, scan was completed without contrast.  CT does not show any significant abnormalities.  I do not appreciate fluctuance on exam.  He reports the pain has improved from what it had been when he had initially presented to the emergency department, but is here for persistent symptoms.  At this time, I do not feel that admission for IV antibiotics or surgical evaluation is appropriate and consider nonbacterial causes of parotitis.  Do feel is appropriate to continue oral antibiotics and have strict return precautions.  Recommended to continue clindamycin which was started yesterday.       Gareth Morgan, MD 07/21/20 1133

## 2020-07-19 NOTE — ED Provider Notes (Signed)
[facility] EMERGENCY DEPT Provider Note   CSN: [id number] Arrival date & time: 07/19/20  1145     History Chief Complaint  Patient presents with  . Facial Swelling    [name] [name] is a 80 y.o. male.  HPI    80 year old male with history of CKD, CHF, CAD comes in with chief complaint of facial swelling.  Patient was seen in the ER 3 or 4 days ago for the swelling, it was thought to be parotitis.  Patient is on Augmentin and clindamycin at this time, but he still continues to have the swelling.  No significant pain with it, unless the area is manipulated.  Patient denies any hearing loss.  Review of system is negative for any neck pain, headache, confusion and there is no history of cancer.  Past Medical History:  Diagnosis Date  . Alzheimer disease ([place])   . CHF (congestive heart failure) ([place])   . Chronic kidney disease    mild insuffiency  . Coronary artery disease    LHC 4/10: Mid LAD 40-50%, then 70%, OM1 20-30%, EF 65%. Mid LAD FFR 0.89 (not hemodynamically significant). Medical therapy was continued.  . Hyperlipidemia   . LBBB (left bundle branch block) 06/2018  . Obstructive sleep apnea     Patient Active Problem List   Diagnosis Date Noted  . 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  . Primary osteoarthritis of left hip 09/06/2018  . Alzheimer's dementia ([place]) 12/07/2016  . 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  . 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  . Venous stasis dermatitis of left lower extremity 04/07/2016  . Bilateral impacted cerumen 01/18/2016  . 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  . Unsteadiness 01/18/2016  . 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  . Cellulitis of left lower leg   . Left leg swelling   . Cellulitis 08/23/2014  . Chronic congestive heart failure ([place]) 04/29/2014  . DOE (dyspnea on exertion) 11/12/2011  . Chronic kidney disease, stage II (mild)   . Thrombocytopenia, unspecified  ([place]) 07/29/2010  . Vitamin D deficiency 05/19/2010  . Depression, major, recurrent, mild ([place]) 05/19/2010  . Urinary urgency 05/19/2010  . 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 Benign essential hypertension 12/23/2009  . Actinic keratosis 12/23/2009  . Seborrheic keratosis 03/18/2009  . Benign neoplasm 03/18/2009  . Other and unspecified hyperlipidemia 01/18/2008  . Obstructive sleep apnea 01/18/2008  . Coronary atherosclerosis 01/18/2008  . Atherosclerotic heart disease of native coronary artery without angina pectoris 01/18/2008  . 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 Unspecified protein-calorie malnutrition ([place]) 09/27/2006  . Gastro-esophageal reflux disease with esophagitis 09/15/2006  . 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 CAD (coronary artery disease), native coronary artery 05/11/2005    Past Surgical History:  Procedure Laterality Date  . CARDIAC CATHETERIZATION  05/02/2003   single vessel,moderate stenosis mid LAD  . CARDIAC CATHETERIZATION  06/12/2008   continue med. therapy  . right shoulder surgery    . TOTAL HIP ARTHROPLASTY Left 09/14/2018   Procedure: LEFT TOTAL HIP ARTHROPLASTY ANTERIOR APPROACH;  Surgeon: Mcarthur Rossetti, MD;  Location: [facility];  Service: Orthopedics;  Laterality: Left;       Family History  Problem Relation Age of Onset  . Cancer Mother   . Hypertension Mother   . Other Father        old age  . Hypertension Father     Social History   Tobacco Use  .  Smoking status: Former Smoker    Years: 40.00    Types: Pipe    Quit date: 02/28/1990    Years since quitting: 30.4  . Smokeless tobacco: Never Used  . Tobacco comment: smoked pipe only   Vaping Use  . Vaping Use: Never used  Substance Use Topics  . Alcohol use: No    Alcohol/week: 0.0 standard drinks    Comment: rarely wine  . Drug use: No    Home Medications Prior to Admission medications    Medication Sig Start Date End Date Taking? Authorizing Provider  clindamycin (CLEOCIN) 150 MG capsule Take by mouth 3 (three) times daily.   Yes [provider]  rosuvastatin (CRESTOR) 10 MG tablet TAKE 1/2 TABLET BY MOUTH EVERY OTHER DAY 06/22/20   [place], Peter M, MD  amoxicillin-clavulanate (AUGMENTIN) 875-125 MG tablet Take 1 tablet by mouth 2 (two) times daily for 7 days. One po bid x 7 days 07/15/20 07/22/20  Sherwood Gambler, MD  Ascorbic Acid (VITAMIN C) 1000 MG tablet Take 1,000 mg by mouth daily.    [provider]  aspirin 81 MG chewable tablet Chew 1 tablet (81 mg total) by mouth 2 (two) times daily. 09/16/18   Pete Pelt, PA-C  calcium-vitamin D (OSCAL WITH D) 500-200 MG-UNIT per tablet Take 1 tablet by mouth 2 (two) times daily.    [provider]  docusate sodium (COLACE) 100 MG capsule Take 100 mg by mouth 2 (two) times daily.    [provider]  donepezil (ARICEPT) 10 MG tablet Take 1 tablet by mouth daily. 12/01/16   [provider]  fesoterodine (TOVIAZ) 8 MG TB24 tablet Take by mouth. 12/13/18   [provider]  fluticasone (FLONASE) 50 MCG/ACT nasal spray Place 2 sprays into both nostrils daily. 05/15/17   [provider]  furosemide (LASIX) 20 MG tablet Take 20 mg by mouth every other day. 04/29/14   [place], Peter M, MD  lisinopril (ZESTRIL) 20 MG tablet Take 20 mg by mouth daily. 08/23/18   [provider]  metroNIDAZOLE (METROGEL) 1 % gel APPLY TOPICALLY D 01/15/19   [provider]  mirabegron ER (MYRBETRIQ) 25 MG TB24 tablet Take 25 mg by mouth daily.  08/29/18   [provider]  Multiple Vitamin (MULTIVITAMIN) tablet Take 1 tablet by mouth daily.    [provider]  Omega-3 Fatty Acids (FISH OIL) 600 MG CAPS Take 1 capsule by mouth 2 (two) times daily.    [provider]  Psyllium (METAMUCIL PO) Take 5 mLs by mouth daily.    [provider]  Saw Palmetto 450 MG  CAPS Take 1 capsule by mouth 2 (two) times daily.     [provider]    Allergies    Darifenacin, Enablex [darifenacin hydrobromide er], Ceftriaxone, Atorvastatin, Ezetimibe, Levaquin [levofloxacin], Other, Oxybutynin, Sulfamethoxazole, Sulfasalazine, and Sulfonamide derivatives  Review of Systems   Review of Systems  Constitutional: Positive for activity change.  HENT: Positive for facial swelling.   Eyes: Negative for visual disturbance.  Gastrointestinal: Negative for nausea and vomiting.  All other systems reviewed and are negative.   Physical Exam Updated Vital Signs BP (!) 172/88 (BP Location: Right Arm)   Pulse 71   Temp 97.9 F (36.6 C) (Oral)   Resp 18   Ht 5\' 10"  (1.778 m)   Wt 95.3 kg   SpO2 96%   BMI 30.13 kg/m   Physical Exam Vitals and nursing note reviewed.  Constitutional:  Appearance: He is well-developed.  HENT:     Head: Atraumatic.     Comments: Left sided preauricular swelling with tenderness to palpation Eyes:     Extraocular Movements: Extraocular movements intact.     Pupils: Pupils are equal, round, and reactive to light.  Cardiovascular:     Rate and Rhythm: Normal rate.  Pulmonary:     Effort: Pulmonary effort is normal.  Musculoskeletal:     Cervical back: Neck supple.  Skin:    General: Skin is warm.  Neurological:     Mental Status: He is alert and oriented to person, place, and time.     ED Results / Procedures / Treatments   Labs (all labs ordered are listed, but only abnormal results are displayed) Labs Reviewed  BASIC METABOLIC PANEL - Abnormal; Notable for the following components:      Result Value   Glucose, Bld 100 (*)    All other components within normal limits  CBC WITH DIFFERENTIAL/PLATELET    EKG None  Radiology No results found.  Procedures Procedures   Medications Ordered in ED Medications - No data to display  ED Course  I have reviewed the triage vital signs and the nursing  notes.  Pertinent labs & imaging results that were available during my care of the patient were reviewed by me and considered in my medical decision making (see chart for details).    MDM Rules/Calculators/A&P                          80 year old male comes in a chief complaint of persistent facial mass.  He is taking antibiotics, there has been no improvement.  Ear evaluation is normal.  No headaches, neck pain.  Parotitis versus lymphadenitis. Doubt cancer at this time, but given the age tumor is also possible.  Noncontrasted CT ordered.  Incoming team to follow-up on the findings.  Final Clinical Impression(s) / ED Diagnoses Final diagnoses:  None    Rx / DC Orders ED Discharge Orders    None       Varney Biles, MD 07/19/20 1541

## 2020-07-26 ENCOUNTER — Other Ambulatory Visit: Payer: Self-pay

## 2020-07-26 ENCOUNTER — Encounter (HOSPITAL_BASED_OUTPATIENT_CLINIC_OR_DEPARTMENT_OTHER): Payer: Self-pay | Admitting: *Deleted

## 2020-07-26 ENCOUNTER — Emergency Department (HOSPITAL_BASED_OUTPATIENT_CLINIC_OR_DEPARTMENT_OTHER): Payer: Medicare Other

## 2020-07-26 ENCOUNTER — Inpatient Hospital Stay (HOSPITAL_BASED_OUTPATIENT_CLINIC_OR_DEPARTMENT_OTHER)
Admission: EM | Admit: 2020-07-26 | Discharge: 2020-07-31 | DRG: 155 | Disposition: A | Discharge status: Home Health | Payer: Medicare Other | Attending: Internal Medicine | Admitting: Internal Medicine

## 2020-07-26 DIAGNOSIS — E1122 Type 2 diabetes mellitus with diabetic chronic kidney disease: Secondary | ICD-10-CM | POA: Diagnosis present

## 2020-07-26 DIAGNOSIS — L03211 Cellulitis of face: Secondary | ICD-10-CM | POA: Diagnosis present

## 2020-07-26 DIAGNOSIS — Z882 Allergy status to sulfonamides status: Secondary | ICD-10-CM

## 2020-07-26 DIAGNOSIS — Z888 Allergy status to other drugs, medicaments and biological substances status: Secondary | ICD-10-CM

## 2020-07-26 DIAGNOSIS — N1831 Chronic kidney disease, stage 3a: Secondary | ICD-10-CM | POA: Diagnosis present

## 2020-07-26 DIAGNOSIS — Z7982 Long term (current) use of aspirin: Secondary | ICD-10-CM

## 2020-07-26 DIAGNOSIS — Z20822 Contact with and (suspected) exposure to covid-19: Secondary | ICD-10-CM | POA: Diagnosis present

## 2020-07-26 DIAGNOSIS — Z881 Allergy status to other antibiotic agents status: Secondary | ICD-10-CM

## 2020-07-26 DIAGNOSIS — K1121 Acute sialoadenitis: Secondary | ICD-10-CM | POA: Diagnosis not present

## 2020-07-26 DIAGNOSIS — I10 Essential (primary) hypertension: Secondary | ICD-10-CM | POA: Diagnosis present

## 2020-07-26 DIAGNOSIS — I5032 Chronic diastolic (congestive) heart failure: Secondary | ICD-10-CM | POA: Diagnosis present

## 2020-07-26 DIAGNOSIS — Z79899 Other long term (current) drug therapy: Secondary | ICD-10-CM

## 2020-07-26 DIAGNOSIS — G4733 Obstructive sleep apnea (adult) (pediatric): Secondary | ICD-10-CM | POA: Diagnosis present

## 2020-07-26 DIAGNOSIS — Z8249 Family history of ischemic heart disease and other diseases of the circulatory system: Secondary | ICD-10-CM

## 2020-07-26 DIAGNOSIS — I130 Hypertensive heart and chronic kidney disease with heart failure and stage 1 through stage 4 chronic kidney disease, or unspecified chronic kidney disease: Secondary | ICD-10-CM | POA: Diagnosis present

## 2020-07-26 DIAGNOSIS — F0280 Dementia in other diseases classified elsewhere without behavioral disturbance: Secondary | ICD-10-CM | POA: Diagnosis present

## 2020-07-26 DIAGNOSIS — Z794 Long term (current) use of insulin: Secondary | ICD-10-CM

## 2020-07-26 DIAGNOSIS — E860 Dehydration: Secondary | ICD-10-CM | POA: Diagnosis present

## 2020-07-26 DIAGNOSIS — Z96642 Presence of left artificial hip joint: Secondary | ICD-10-CM | POA: Diagnosis present

## 2020-07-26 DIAGNOSIS — I2510 Atherosclerotic heart disease of native coronary artery without angina pectoris: Secondary | ICD-10-CM | POA: Diagnosis present

## 2020-07-26 DIAGNOSIS — Z87891 Personal history of nicotine dependence: Secondary | ICD-10-CM

## 2020-07-26 DIAGNOSIS — E785 Hyperlipidemia, unspecified: Secondary | ICD-10-CM | POA: Diagnosis present

## 2020-07-26 DIAGNOSIS — N400 Enlarged prostate without lower urinary tract symptoms: Secondary | ICD-10-CM | POA: Diagnosis present

## 2020-07-26 DIAGNOSIS — I447 Left bundle-branch block, unspecified: Secondary | ICD-10-CM | POA: Diagnosis present

## 2020-07-26 DIAGNOSIS — G309 Alzheimer's disease, unspecified: Secondary | ICD-10-CM | POA: Diagnosis present

## 2020-07-26 LAB — COMPREHENSIVE METABOLIC PANEL
ALT: 23 U/L (ref 0–44)
AST: 17 U/L (ref 15–41)
Albumin: 4.5 g/dL (ref 3.5–5.0)
Alkaline Phosphatase: 67 U/L (ref 38–126)
Anion gap: 9 (ref 5–15)
BUN: 22 mg/dL (ref 8–23)
CO2: 27 mmol/L (ref 22–32)
Calcium: 10.4 mg/dL — ABNORMAL HIGH (ref 8.9–10.3)
Chloride: 99 mmol/L (ref 98–111)
Creatinine, Ser: 1.26 mg/dL — ABNORMAL HIGH (ref 0.61–1.24)
GFR, Estimated: 57 mL/min — ABNORMAL LOW (ref >60)
Glucose, Bld: 173 mg/dL — ABNORMAL HIGH (ref 70–99)
Potassium: 4.2 mmol/L (ref 3.5–5.1)
Sodium: 135 mmol/L (ref 135–145)
Total Bilirubin: 0.9 mg/dL (ref 0.3–1.2)
Total Protein: 7.6 g/dL (ref 6.5–8.1)

## 2020-07-26 LAB — URINALYSIS, ROUTINE W REFLEX MICROSCOPIC
Bilirubin Urine: NEGATIVE
Glucose, UA: NEGATIVE mg/dL
Hgb urine dipstick: NEGATIVE
Ketones, ur: NEGATIVE mg/dL
Nitrite: NEGATIVE
Protein, ur: NEGATIVE mg/dL
Specific Gravity, Urine: 1.014 (ref 1.005–1.030)
pH: 6.5 (ref 5.0–8.0)

## 2020-07-26 LAB — RESP PANEL BY RT-PCR (FLU A&B, COVID) ARPGX2
Influenza A by PCR: NEGATIVE
Influenza B by PCR: NEGATIVE
SARS Coronavirus 2 by RT PCR: NEGATIVE

## 2020-07-26 LAB — CBC WITH DIFFERENTIAL/PLATELET
Abs Immature Granulocytes: 0.04 10*3/uL (ref 0.00–0.07)
Basophils Absolute: 0 10*3/uL (ref 0.0–0.1)
Basophils Relative: 0 %
Eosinophils Absolute: 0 10*3/uL (ref 0.0–0.5)
Eosinophils Relative: 0 %
HCT: 42.6 % (ref 39.0–52.0)
Hemoglobin: 14.4 g/dL (ref 13.0–17.0)
Immature Granulocytes: 0 %
Lymphocytes Relative: 6 %
Lymphs Abs: 0.8 10*3/uL (ref 0.7–4.0)
MCH: 31.3 pg (ref 26.0–34.0)
MCHC: 33.8 g/dL (ref 30.0–36.0)
MCV: 92.6 fL (ref 80.0–100.0)
Monocytes Absolute: 0.8 10*3/uL (ref 0.1–1.0)
Monocytes Relative: 6 %
Neutro Abs: 11.5 10*3/uL — ABNORMAL HIGH (ref 1.7–7.7)
Neutrophils Relative %: 88 %
Platelets: 220 10*3/uL (ref 150–400)
RBC: 4.6 MIL/uL (ref 4.22–5.81)
RDW: 13.1 % (ref 11.5–15.5)
WBC: 13.2 10*3/uL — ABNORMAL HIGH (ref 4.0–10.5)
nRBC: 0 % (ref 0.0–0.2)

## 2020-07-26 LAB — PROTIME-INR
INR: 1.1 (ref 0.8–1.2)
Prothrombin Time: 13.9 seconds (ref 11.4–15.2)

## 2020-07-26 LAB — LACTIC ACID, PLASMA: Lactic Acid, Venous: 1.6 mmol/L (ref 0.5–1.9)

## 2020-07-26 MED ORDER — LACTATED RINGERS IV BOLUS (SEPSIS)
1000.0000 mL | Freq: Once | INTRAVENOUS | Status: AC
  Administered 2020-07-26: 1000 mL via INTRAVENOUS

## 2020-07-26 MED ORDER — ACETAMINOPHEN 500 MG PO TABS
1000.0000 mg | ORAL_TABLET | Freq: Once | ORAL | Status: AC
  Administered 2020-07-26: 1000 mg via ORAL
  Filled 2020-07-26: qty 2

## 2020-07-26 MED ORDER — VANCOMYCIN HCL IN DEXTROSE 1-5 GM/200ML-% IV SOLN
1000.0000 mg | INTRAVENOUS | Status: AC
  Administered 2020-07-26 (×2): 1000 mg via INTRAVENOUS
  Filled 2020-07-26 (×2): qty 200

## 2020-07-26 MED ORDER — VANCOMYCIN HCL 1250 MG/250ML IV SOLN
1250.0000 mg | INTRAVENOUS | Status: DC
  Filled 2020-07-26: qty 250

## 2020-07-26 MED ORDER — PIPERACILLIN-TAZOBACTAM 3.375 G IVPB 30 MIN
3.3750 g | Freq: Once | INTRAVENOUS | Status: AC
  Administered 2020-07-26: 3.375 g via INTRAVENOUS
  Filled 2020-07-26: qty 50

## 2020-07-26 NOTE — Progress Notes (Signed)
Pharmacy Antibiotic Note  [name] [name] is a 80 y.o. male admitted on 07/26/2020 with infection of parotid glands.  Pharmacy has been consulted for vancomycin dosing. WBC wnl. SCr wnl   Plan: -Start vancomycin 2 gm IV load followed by Vancomycin 1250 mg IV Q 24 hrs. Goal AUC 400-550. Expected AUC: 512 SCr used: 1.02  -F/u maintenance gram neg coverage -Monitor CBC, renal fx, cultures and clinical progress -Vanc levels as indicated     Height: 5\' 10"  (177.8 cm) Weight: 95.2 kg (209 lb 14.1 oz) (from chart) IBW/kg (Calculated) : 73  Temp (24hrs), Avg:102.6 F (39.2 C), Min:102.6 F (39.2 C), Max:102.6 F (39.2 C)  No results for input(s): WBC, CREATININE, LATICACIDVEN, VANCOTROUGH, VANCOPEAK, VANCORANDOM, GENTTROUGH, GENTPEAK, GENTRANDOM, TOBRATROUGH, TOBRAPEAK, TOBRARND, AMIKACINPEAK, AMIKACINTROU, AMIKACIN in the last 168 hours.  Estimated Creatinine Clearance: 63.4 mL/min (by C-G formula based on SCr of 1.04 mg/dL).    Allergies  Allergen Reactions  . Darifenacin     Other reaction(s): Mental Status Changes (intolerance) PT STATES IT MAKES HIM "BONKERS" AND VERY TIRED  . Enablex [Darifenacin Hydrobromide Er]     PT STATES IT MAKES HIM "BONKERS" AND VERY TIRED  . Ceftriaxone     Unknown reaction  . Atorvastatin     Muscle aches/weakness  . Ezetimibe     Other reaction(s): Other (See Comments) Muscle aches/weakness  . Levaquin [Levofloxacin] Diarrhea    Caused diarheer a   . Other     Intolerance to zetia and lipitor  . Oxybutynin Other (See Comments)    Memory impairment, temperament changes  . Sulfamethoxazole     Other reaction(s): Other (See Comments) Childhood allergy  . Sulfasalazine     Other reaction(s): Other (See Comments) Unknown per daughter  . Sulfonamide Derivatives     Antimicrobials this admission: Zosyn 5/29 >>  Vancomycin 5/29 >>   Dose adjustments this admission:  Microbiology results: 5/29 BCx:    Thank you for allowing pharmacy to  be a part of this patient's care.  Albertina Parr, PharmD., BCPS, BCCCP Clinical Pharmacist Please refer to [facility] for unit-specific pharmacist

## 2020-07-26 NOTE — ED Notes (Signed)
Dr. Roslynn Amble evaluating pt in triage

## 2020-07-26 NOTE — ED Notes (Signed)
Patient transported to Radiology at this time.

## 2020-07-26 NOTE — ED Triage Notes (Signed)
Pt's son reports pt was slightly confused this morning and took at nap around 2:30 and when he got up at 5pm he was very confused. Temp 102.6 in triage. EDP made aware. Pt was seen here last week for parotiditis

## 2020-07-26 NOTE — ED Provider Notes (Signed)
[facility] EMERGENCY DEPT Provider Note   CSN: [id number] Arrival date & time: 07/26/20  2015     History Chief Complaint  Patient presents with  . Altered Mental Status    [name] [name] is a 80 y.o. male.  Presents to ER with concern for confusion, fever.  Patient has been treated outpatient for parotitis with Augmentin and clindamycin.  Seen in ER as well as ENT office.  Also completed course of steroids.  Son feels like the swelling is gotten mildly better but redness has persisted.  This afternoon after patient got up from a nap, seemed more confused than normal.  Patient has not had any neck pain, headache or neck stiffness.  No nausea or vomiting.  History limited due to acuity, altered mental status.  Level 5 caveat.  HPI     Past Medical History:  Diagnosis Date  . Alzheimer disease ([place])   . CHF (congestive heart failure) ([place])   . Chronic kidney disease    mild insuffiency  . Coronary artery disease    LHC 4/10: Mid LAD 40-50%, then 70%, OM1 20-30%, EF 65%. Mid LAD FFR 0.89 (not hemodynamically significant). Medical therapy was continued.  . Hyperlipidemia   . LBBB (left bundle branch block) 06/2018  . Obstructive sleep apnea     Patient Active Problem List   Diagnosis Date Noted  . Facial cellulitis 07/26/2020  . 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  . Primary osteoarthritis of left hip 09/06/2018  . Alzheimer's dementia ([place]) 12/07/2016  . 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  . 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  . Venous stasis dermatitis of left lower extremity 04/07/2016  . Bilateral impacted cerumen 01/18/2016  . 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  . Unsteadiness 01/18/2016  . 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  . Cellulitis of left lower leg   . Left leg swelling   . Cellulitis 08/23/2014  . Chronic congestive heart failure ([place]) 04/29/2014  . DOE (dyspnea on exertion) 11/12/2011  .  Chronic kidney disease, stage II (mild)   . Thrombocytopenia, unspecified ([place]) 07/29/2010  . Vitamin D deficiency 05/19/2010  . Depression, major, recurrent, mild ([place]) 05/19/2010  . Urinary urgency 05/19/2010  . 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 Benign essential hypertension 12/23/2009  . Actinic keratosis 12/23/2009  . Seborrheic keratosis 03/18/2009  . Benign neoplasm 03/18/2009  . Other and unspecified hyperlipidemia 01/18/2008  . Obstructive sleep apnea 01/18/2008  . Coronary atherosclerosis 01/18/2008  . Atherosclerotic heart disease of native coronary artery without angina pectoris 01/18/2008  . 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 Unspecified protein-calorie malnutrition ([place]) 09/27/2006  . Gastro-esophageal reflux disease with esophagitis 09/15/2006  . 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 CAD (coronary artery disease), native coronary artery 05/11/2005    Past Surgical History:  Procedure Laterality Date  . CARDIAC CATHETERIZATION  05/02/2003   single vessel,moderate stenosis mid LAD  . CARDIAC CATHETERIZATION  06/12/2008   continue med. therapy  . right shoulder surgery    . TOTAL HIP ARTHROPLASTY Left 09/14/2018   Procedure: LEFT TOTAL HIP ARTHROPLASTY ANTERIOR APPROACH;  Surgeon: Mcarthur Rossetti, MD;  Location: [facility];  Service: Orthopedics;  Laterality: Left;       Family History  Problem Relation Age of Onset  . Cancer Mother   . Hypertension Mother   . Other Father        old age  . Hypertension Father  Social History   Tobacco Use  . Smoking status: Former Smoker    Years: 40.00    Types: Pipe    Quit date: 02/28/1990    Years since quitting: 30.4  . Smokeless tobacco: Never Used  . Tobacco comment: smoked pipe only   Vaping Use  . Vaping Use: Never used  Substance Use Topics  . Alcohol use: No    Alcohol/week: 0.0 standard drinks    Comment: rarely wine  . Drug  use: No    Home Medications Prior to Admission medications   Medication Sig Start Date End Date Taking? Authorizing Provider  rosuvastatin (CRESTOR) 10 MG tablet TAKE 1/2 TABLET BY MOUTH EVERY OTHER DAY 06/22/20   Martinique, Peter M, MD  Ascorbic Acid (VITAMIN C) 1000 MG tablet Take 1,000 mg by mouth daily.    [provider]  aspirin 81 MG chewable tablet Chew 1 tablet (81 mg total) by mouth 2 (two) times daily. 09/16/18   Pete Pelt, PA-C  calcium-vitamin D (OSCAL WITH D) 500-200 MG-UNIT per tablet Take 1 tablet by mouth 2 (two) times daily.    [provider]  clindamycin (CLEOCIN) 150 MG capsule Take by mouth 3 (three) times daily.    [provider]  docusate sodium (COLACE) 100 MG capsule Take 100 mg by mouth 2 (two) times daily.    [provider]  donepezil (ARICEPT) 10 MG tablet Take 1 tablet by mouth daily. 12/01/16   [provider]  fesoterodine (TOVIAZ) 8 MG TB24 tablet Take by mouth. 12/13/18   [provider]  fluticasone (FLONASE) 50 MCG/ACT nasal spray Place 2 sprays into both nostrils daily. 05/15/17   [provider]  furosemide (LASIX) 20 MG tablet Take 20 mg by mouth every other day. 04/29/14   [place], Peter M, MD  lisinopril (ZESTRIL) 20 MG tablet Take 20 mg by mouth daily. 08/23/18   [provider]  metroNIDAZOLE (METROGEL) 1 % gel APPLY TOPICALLY D 01/15/19   [provider]  mirabegron ER (MYRBETRIQ) 25 MG TB24 tablet Take 25 mg by mouth daily.  08/29/18   [provider]  Multiple Vitamin (MULTIVITAMIN) tablet Take 1 tablet by mouth daily.    [provider]  Omega-3 Fatty Acids (FISH OIL) 600 MG CAPS Take 1 capsule by mouth 2 (two) times daily.    [provider]  Psyllium (METAMUCIL PO) Take 5 mLs by mouth daily.    [provider]  Saw Palmetto 450 MG CAPS Take 1 capsule by mouth 2 (two) times daily.     [provider]    Allergies     Darifenacin, Enablex [darifenacin hydrobromide er], Ceftriaxone, Atorvastatin, Ezetimibe, Levaquin [levofloxacin], Other, Oxybutynin, Sulfamethoxazole, Sulfasalazine, and Sulfonamide derivatives  Review of Systems   Review of Systems  Unable to perform ROS: Mental status change    Physical Exam Updated Vital Signs BP (!) 147/71 (BP Location: Right Arm)   Pulse 95   Temp 99.9 F (37.7 C) (Oral)   Resp (!) 26   Ht 5\' 10"  (1.778 m)   Wt 95.2 kg Comment: from chart  SpO2 95%   BMI 30.11 kg/m   Physical Exam Vitals and nursing note reviewed.  Constitutional:      Appearance: He is well-developed.     Comments: Alert, mildly confused  HENT:     Head: Normocephalic.     Comments: Left face has mild swelling, generalized erythema, left TM appears normal, no erythema or purulence Eyes:  Conjunctiva/sclera: Conjunctivae normal.  Cardiovascular:     Rate and Rhythm: Normal rate and regular rhythm.     Heart sounds: No murmur heard.   Pulmonary:     Effort: Pulmonary effort is normal. No respiratory distress.     Breath sounds: Normal breath sounds.  Abdominal:     Palpations: Abdomen is soft.     Tenderness: There is no abdominal tenderness.  Musculoskeletal:     Cervical back: Neck supple.  Skin:    General: Skin is warm and dry.  Neurological:     Mental Status: He is alert.     Comments: Alert, oriented to person but not time or place, answering basic questions appropriately but no complex questions     ED Results / Procedures / Treatments   Labs (all labs ordered are listed, but only abnormal results are displayed) Labs Reviewed  COMPREHENSIVE METABOLIC PANEL - Abnormal; Notable for the following components:      Result Value   Glucose, Bld 173 (*)    Creatinine, Ser 1.26 (*)    Calcium 10.4 (*)    GFR, Estimated 57 (*)    All other components within normal limits  CBC WITH DIFFERENTIAL/PLATELET - Abnormal; Notable for the following components:   WBC 13.2  (*)    Neutro Abs 11.5 (*)    All other components within normal limits  URINALYSIS, ROUTINE W REFLEX MICROSCOPIC - Abnormal; Notable for the following components:   Color, Urine COLORLESS (*)    Leukocytes,Ua SMALL (*)    All other components within normal limits  RESP PANEL BY RT-PCR (FLU A&B, COVID) ARPGX2  CULTURE, BLOOD (ROUTINE X 2)  CULTURE, BLOOD (ROUTINE X 2)  URINE CULTURE  LACTIC ACID, PLASMA  PROTIME-INR  LACTIC ACID, PLASMA    EKG None  Radiology DG Chest Port 1 View  Result Date: 07/26/2020 CLINICAL DATA:  80 year old male with concern for sepsis. EXAM: PORTABLE CHEST 1 VIEW COMPARISON:  Chest radiograph dated 04/21/2020. FINDINGS: Shallow inspiration with bibasilar atelectasis. No focal consolidation, pleural effusion, or pneumothorax. Stable cardiac silhouette. No acute osseous pathology. IMPRESSION: Bibasilar atelectasis. Electronically Signed   By: Anner Crete M.D.   On: 07/26/2020 21:26   CT Maxillofacial Wo Contrast  Result Date: 07/26/2020 CLINICAL DATA:  80 year old male with facial/maxillary abscess. EXAM: CT MAXILLOFACIAL WITHOUT CONTRAST TECHNIQUE: Multidetector CT imaging of the maxillofacial structures was performed. Multiplanar CT image reconstructions were also generated. COMPARISON:  None. FINDINGS: Evaluation of this exam is limited in the absence of intravenous contrast. Osseous: No acute fracture or subluxation. Degenerative changes of the TMJs bilaterally. Orbits: The globes and retro-orbital fat are preserved. Sinuses: Clear. Soft tissues: Small pocket air along the floor of the mouth the right abutting the right mandible. No drainable fluid collection or abscess. Limited intracranial: No significant or unexpected finding. IMPRESSION: Small pocket of air along the floor of the mouth the right abutting the right mandible. No drainable fluid collection or abscess. Electronically Signed   By: Anner Crete M.D.   On: 07/26/2020 21:32     Procedures Procedures   Medications Ordered in ED Medications  vancomycin (VANCOCIN) IVPB 1000 mg/200 mL premix (1,000 mg Intravenous New Bag/Given 07/26/20 2244)  vancomycin (VANCOREADY) IVPB 1250 mg/250 mL (has no administration in time range)  lactated ringers bolus 1,000 mL (0 mLs Intravenous Stopped 07/26/20 2304)  piperacillin-tazobactam (ZOSYN) IVPB 3.375 g (0 g Intravenous Stopped 07/26/20 2200)  acetaminophen (TYLENOL) tablet 1,000 mg (1,000 mg Oral Given 07/26/20 2126)  ED Course  I have reviewed the triage vital signs and the nursing notes.  Pertinent labs & imaging results that were available during my care of the patient were reviewed by me and considered in my medical decision making (see chart for details).    MDM Rules/Calculators/A&P                         80 year old male presenting to ER with concern for confusion found to be febrile in triage. On exam noted to have erythema over the left face.  Lab work noted for leukocytosis.  CT negative for any discrete abscess.  After patient defervesced, his confusion resolved and had complete return to his mental baseline.  Given normal lactate and current well appearance and stable vitals, low suspicion for frank sepsis but given his fever, change in mental status, leukocytosis despite outpatient oral antibiotics, believe patient would benefit from admission for IV antibiotics and further observation.  Started on broad-spectrum antibiotics, admitted to hospitalist for further management.  [facility] will accept.  Final Clinical Impression(s) / ED Diagnoses Final diagnoses:  Facial cellulitis    Rx / DC Orders ED Discharge Orders    None       Lucrezia Starch, MD 07/26/20 2343

## 2020-07-27 DIAGNOSIS — Z881 Allergy status to other antibiotic agents status: Secondary | ICD-10-CM | POA: Diagnosis not present

## 2020-07-27 DIAGNOSIS — K1121 Acute sialoadenitis: Secondary | ICD-10-CM | POA: Diagnosis present

## 2020-07-27 DIAGNOSIS — F0280 Dementia in other diseases classified elsewhere without behavioral disturbance: Secondary | ICD-10-CM | POA: Diagnosis present

## 2020-07-27 DIAGNOSIS — G4733 Obstructive sleep apnea (adult) (pediatric): Secondary | ICD-10-CM | POA: Diagnosis present

## 2020-07-27 DIAGNOSIS — I10 Essential (primary) hypertension: Secondary | ICD-10-CM | POA: Diagnosis not present

## 2020-07-27 DIAGNOSIS — I2510 Atherosclerotic heart disease of native coronary artery without angina pectoris: Secondary | ICD-10-CM | POA: Diagnosis present

## 2020-07-27 DIAGNOSIS — Z888 Allergy status to other drugs, medicaments and biological substances status: Secondary | ICD-10-CM | POA: Diagnosis not present

## 2020-07-27 DIAGNOSIS — I5032 Chronic diastolic (congestive) heart failure: Secondary | ICD-10-CM | POA: Diagnosis present

## 2020-07-27 DIAGNOSIS — N1831 Chronic kidney disease, stage 3a: Secondary | ICD-10-CM | POA: Diagnosis present

## 2020-07-27 DIAGNOSIS — Z882 Allergy status to sulfonamides status: Secondary | ICD-10-CM | POA: Diagnosis not present

## 2020-07-27 DIAGNOSIS — E1122 Type 2 diabetes mellitus with diabetic chronic kidney disease: Secondary | ICD-10-CM | POA: Diagnosis present

## 2020-07-27 DIAGNOSIS — Z794 Long term (current) use of insulin: Secondary | ICD-10-CM | POA: Diagnosis not present

## 2020-07-27 DIAGNOSIS — Z8249 Family history of ischemic heart disease and other diseases of the circulatory system: Secondary | ICD-10-CM | POA: Diagnosis not present

## 2020-07-27 DIAGNOSIS — E785 Hyperlipidemia, unspecified: Secondary | ICD-10-CM | POA: Diagnosis present

## 2020-07-27 DIAGNOSIS — I447 Left bundle-branch block, unspecified: Secondary | ICD-10-CM | POA: Diagnosis present

## 2020-07-27 DIAGNOSIS — E860 Dehydration: Secondary | ICD-10-CM | POA: Diagnosis present

## 2020-07-27 DIAGNOSIS — N400 Enlarged prostate without lower urinary tract symptoms: Secondary | ICD-10-CM | POA: Diagnosis present

## 2020-07-27 DIAGNOSIS — Z20822 Contact with and (suspected) exposure to covid-19: Secondary | ICD-10-CM | POA: Diagnosis present

## 2020-07-27 DIAGNOSIS — L03211 Cellulitis of face: Secondary | ICD-10-CM | POA: Diagnosis present

## 2020-07-27 DIAGNOSIS — Z79899 Other long term (current) drug therapy: Secondary | ICD-10-CM | POA: Diagnosis not present

## 2020-07-27 DIAGNOSIS — Z96642 Presence of left artificial hip joint: Secondary | ICD-10-CM | POA: Diagnosis present

## 2020-07-27 DIAGNOSIS — G308 Other Alzheimer's disease: Secondary | ICD-10-CM | POA: Diagnosis not present

## 2020-07-27 DIAGNOSIS — Z87891 Personal history of nicotine dependence: Secondary | ICD-10-CM | POA: Diagnosis not present

## 2020-07-27 DIAGNOSIS — I130 Hypertensive heart and chronic kidney disease with heart failure and stage 1 through stage 4 chronic kidney disease, or unspecified chronic kidney disease: Secondary | ICD-10-CM | POA: Diagnosis present

## 2020-07-27 DIAGNOSIS — G309 Alzheimer's disease, unspecified: Secondary | ICD-10-CM | POA: Diagnosis present

## 2020-07-27 DIAGNOSIS — Z7982 Long term (current) use of aspirin: Secondary | ICD-10-CM | POA: Diagnosis not present

## 2020-07-27 LAB — GLUCOSE, CAPILLARY
Glucose-Capillary: 148 mg/dL — ABNORMAL HIGH (ref 70–99)
Glucose-Capillary: 147 mg/dL — ABNORMAL HIGH (ref 70–99)
Glucose-Capillary: 148 mg/dL — ABNORMAL HIGH (ref 70–99)

## 2020-07-27 LAB — LACTIC ACID, PLASMA: Lactic Acid, Venous: 1.6 mmol/L (ref 0.5–1.9)

## 2020-07-27 LAB — PROCALCITONIN: Procalcitonin: 0.49 ng/mL

## 2020-07-27 MED ORDER — INSULIN ASPART 100 UNIT/ML IJ SOLN
0.0000 [IU] | Freq: Three times a day (TID) | INTRAMUSCULAR | Status: DC
  Administered 2020-07-27: 3 [IU] via SUBCUTANEOUS
  Administered 2020-07-27: 2 [IU] via SUBCUTANEOUS
  Administered 2020-07-28: 3 [IU] via SUBCUTANEOUS
  Administered 2020-07-28: 2 [IU] via SUBCUTANEOUS
  Administered 2020-07-28 – 2020-07-29 (×2): 3 [IU] via SUBCUTANEOUS
  Administered 2020-07-29: 2 [IU] via SUBCUTANEOUS
  Administered 2020-07-29 – 2020-07-30 (×2): 3 [IU] via SUBCUTANEOUS
  Administered 2020-07-30: 2 [IU] via SUBCUTANEOUS
  Administered 2020-07-30: 11 [IU] via SUBCUTANEOUS
  Administered 2020-07-31: 3 [IU] via SUBCUTANEOUS

## 2020-07-27 MED ORDER — LACTATED RINGERS IV SOLN: INTRAVENOUS | Status: DC

## 2020-07-27 MED ORDER — ONDANSETRON HCL 4 MG/2ML IJ SOLN: 4.0000 mg | Freq: Four times a day (QID) | INTRAMUSCULAR | Status: DC | PRN

## 2020-07-27 MED ORDER — SODIUM CHLORIDE 0.9% FLUSH
3.0000 mL | Freq: Two times a day (BID) | INTRAVENOUS | Status: DC
  Administered 2020-07-27 – 2020-07-29 (×4): 3 mL via INTRAVENOUS

## 2020-07-27 MED ORDER — LISINOPRIL 10 MG PO TABS
20.0000 mg | ORAL_TABLET | Freq: Every day | ORAL | Status: DC
  Administered 2020-07-27 – 2020-07-31 (×5): 20 mg via ORAL
  Filled 2020-07-27 (×5): qty 2

## 2020-07-27 MED ORDER — ACETAMINOPHEN 650 MG RE SUPP: 650.0000 mg | Freq: Four times a day (QID) | RECTAL | Status: DC | PRN

## 2020-07-27 MED ORDER — ASPIRIN 81 MG PO CHEW
81.0000 mg | CHEWABLE_TABLET | Freq: Two times a day (BID) | ORAL | Status: DC
  Administered 2020-07-27 – 2020-07-31 (×9): 81 mg via ORAL
  Filled 2020-07-27 (×9): qty 1

## 2020-07-27 MED ORDER — FESOTERODINE FUMARATE ER 8 MG PO TB24
8.0000 mg | ORAL_TABLET | Freq: Every day | ORAL | Status: DC
  Administered 2020-07-27 – 2020-07-31 (×5): 8 mg via ORAL
  Filled 2020-07-27 (×5): qty 1

## 2020-07-27 MED ORDER — MIRABEGRON ER 25 MG PO TB24
25.0000 mg | ORAL_TABLET | Freq: Every day | ORAL | Status: DC
  Administered 2020-07-27 – 2020-07-31 (×5): 25 mg via ORAL
  Filled 2020-07-27 (×5): qty 1

## 2020-07-27 MED ORDER — DOCUSATE SODIUM 100 MG PO CAPS
100.0000 mg | ORAL_CAPSULE | Freq: Every day | ORAL | Status: DC
  Administered 2020-07-28 – 2020-07-31 (×4): 100 mg via ORAL
  Filled 2020-07-27 (×5): qty 1

## 2020-07-27 MED ORDER — ROSUVASTATIN CALCIUM 5 MG PO TABS
5.0000 mg | ORAL_TABLET | ORAL | Status: DC
  Administered 2020-07-27 – 2020-07-31 (×3): 5 mg via ORAL
  Filled 2020-07-27 (×3): qty 1

## 2020-07-27 MED ORDER — ENOXAPARIN SODIUM 40 MG/0.4ML IJ SOSY
40.0000 mg | PREFILLED_SYRINGE | INTRAMUSCULAR | Status: DC
  Administered 2020-07-27 – 2020-07-31 (×5): 40 mg via SUBCUTANEOUS
  Filled 2020-07-27 (×5): qty 0.4

## 2020-07-27 MED ORDER — ONDANSETRON HCL 4 MG PO TABS: 4.0000 mg | ORAL_TABLET | Freq: Four times a day (QID) | ORAL | Status: DC | PRN

## 2020-07-27 MED ORDER — ACETAMINOPHEN 325 MG PO TABS: 650.0000 mg | ORAL_TABLET | Freq: Four times a day (QID) | ORAL | Status: DC | PRN

## 2020-07-27 MED ORDER — DONEPEZIL HCL 5 MG PO TABS
10.0000 mg | ORAL_TABLET | Freq: Every day | ORAL | Status: DC
  Administered 2020-07-27 – 2020-07-30 (×4): 10 mg via ORAL
  Filled 2020-07-27 (×4): qty 2

## 2020-07-27 MED ORDER — PIPERACILLIN-TAZOBACTAM 3.375 G IVPB
3.3750 g | Freq: Three times a day (TID) | INTRAVENOUS | Status: DC
  Administered 2020-07-27 – 2020-07-30 (×9): 3.375 g via INTRAVENOUS
  Filled 2020-07-27 (×10): qty 50

## 2020-07-27 MED ORDER — VANCOMYCIN HCL 1000 MG/200ML IV SOLN
1000.0000 mg | INTRAVENOUS | Status: DC
  Administered 2020-07-27 – 2020-07-29 (×3): 1000 mg via INTRAVENOUS
  Filled 2020-07-27 (×4): qty 200

## 2020-07-27 NOTE — ED Notes (Signed)
[facility] at the Bedside; Report given to same.

## 2020-07-27 NOTE — Progress Notes (Signed)
07/27/2020 0545 Received pt to room 4E-17 from [facility] with dx of AMS/Sepsis.  Pt is Alert to self, no C/O voiced.  Tele monitor placed and CCMD notified.  CHG bath done.  Oriented to room, call light and bed.  Call bell in reach.   Carney Corners

## 2020-07-27 NOTE — ED Notes (Signed)
Daughter [name] [name] would like to be called if there are any updates on PT and when he is assigned a room so she can visit in the AM.

## 2020-07-27 NOTE — ED Notes (Signed)
This RN changed this patients bedding and cleaned this patients perineal area. Patient clean and dry at this time. Fresh blankets provided. Patient comfortable and resting at this time and instructed on Call [facility] use which is located at patients side within reach. Examination Door remains open at this time for patient safety.

## 2020-07-27 NOTE — Progress Notes (Signed)
Patient placed on home CPAP at this time.  

## 2020-07-27 NOTE — Progress Notes (Signed)
Pharmacy Antibiotic Note  [name] [name] is a 80 y.o. male admitted on 07/26/2020 with infection of parotid glands.  Pharmacy has been consulted for vancomycin dosing, patient received 2g load last night. WBC 13.2, Tmax 99.1, BP wnl. SCr up from 1.04>1.26. Will adjust vancomycin dose.  Plan: -Vancomycin 1000 mg IV Q 24 hrs. Goal AUC 400-550. Expected AUC: 501 SCr used: 1.26, Vd coefficient 0.5 (BMI ~30) -F/u maintenance gram neg coverage -Monitor CBC, renal fx, cultures and clinical progress -Vanc levels as indicated    Height: 5\' 10"  (177.8 cm) Weight: 92.5 kg (203 lb 14.8 oz) IBW/kg (Calculated) : 73  Temp (24hrs), Avg:100 F (37.8 C), Min:98.4 F (36.9 C), Max:102.8 F (39.3 C)  Recent Labs  Lab 07/26/20 2052 07/27/20 0639  WBC 13.2*  --   CREATININE 1.26*  --   LATICACIDVEN 1.6 1.6    Estimated Creatinine Clearance: 51.7 mL/min (A) (by C-G formula based on SCr of 1.26 mg/dL (H)).    Allergies  Allergen Reactions  . Darifenacin     Other reaction(s): Mental Status Changes (intolerance) PT STATES IT MAKES HIM "BONKERS" AND VERY TIRED  . Enablex [Darifenacin Hydrobromide Er]     PT STATES IT MAKES HIM "BONKERS" AND VERY TIRED  . Ceftriaxone     Unknown reaction  . Atorvastatin     Muscle aches/weakness  . Ezetimibe     Other reaction(s): Other (See Comments) Muscle aches/weakness  . Levaquin [Levofloxacin] Diarrhea    Caused diarheer a   . Other     Intolerance to zetia and lipitor  . Oxybutynin Other (See Comments)    Memory impairment, temperament changes  . Sulfamethoxazole     Other reaction(s): Other (See Comments) Childhood allergy  . Sulfasalazine     Other reaction(s): Other (See Comments) Unknown per daughter  . Sulfonamide Derivatives     Antimicrobials this admission: Zosyn 5/29 >>  Vancomycin 5/29 >>   Dose adjustments this admission:  Microbiology results: 5/29 BCx: pend   Thank you for allowing pharmacy to be a part of this  patient's care.  Norina Buzzard, PharmD PGY1 Pharmacy Resident 07/27/2020 10:01 AM  Please refer to [facility] for unit-specific pharmacist

## 2020-07-27 NOTE — ED Notes (Signed)
Report/Care Handoff given to [place], Therapist, sports at this time. All questions answered at this time.

## 2020-07-27 NOTE — ED Notes (Signed)
Patient transferred into Examination Room closer to [place] for Patient Safety. Patient clean and comfortable at this time. Call [place] within patients reach. RN will continue to monitor.

## 2020-07-27 NOTE — ED Notes (Signed)
Family made aware of patients transfer and current room number; family consented to transfer at this time over the phone due to patients inability to sign for consent due to AMS.

## 2020-07-27 NOTE — H&P (Signed)
History and Physical    [name] [name]:[id number] DOB: Jan 29, 1941 DOA: 07/26/2020  PCP: Chesley Noon, MD Consultants:  Marlou Sa - orthopedics; [place] - cardiolgy; Wert - pulmonology Patient coming from: Home - lives with daughter and her family; NOK: Daughter, [name] [name], [phone]  Chief Complaint:  Refractory parotitis  HPI: [name] [name] is a 80 y.o. male with medical history significant of OSA; HLD; CAD; chronic diastolic CHF; and dementia with recent diagnosis of parotitis presenting with outpatient treatment failure.  He was initially seen in the ER on 5/18, having awoken that AM with acute left-sided facial swelling; he was diagnosed with parotitis and given Augmentin.  He returned on 5/22 because it was not improving; he had been given Clinda the day prior (in addition to Augmentin, also referred to ENT on 6/8), CT was negative, and he was told to resume Augmentin/Clinda.  He woke yesterday and was confused and so returned again and was found to have fever to 102.6.  He was started on Vanc/Zosyn and transferred to [facility].  At the time of my evaluation, the patient was sleeping comfortably.  Upon awakening, he was oriented only to person and unable to effectively answer questions about why he is here.  I spoke with his daughter.  It started about 5/18.  He reported a toothache - went to the dentist and it wasn't dental, sent him to the ER.  He has been back and forth since for this issue - 2 visits to the ER, 1 to the UC, and to the PCP.  He started getting very confused and disoriented last night.  He has dementia and is "pretty good most of the time" - he is able to do most everything on his own.  He is still able to keep track of his bills.  He was "dazed" last night - couldn't decide which dinner he wanted, didn't want his ice cream.    ED Course:  [facility] transfer to [facility], per Dr. Hal Hope:  80 year old male with known history of CAD presents with worsening swelling on the  left side of the face failed outpatient antibiotics for cellulitis. Patient fever confusion CT scan does not show any definite abscess. Started on IV antibiotics admitted for failed outpatient therapy for the cellulitis. Patient's confusion improved after fever resolved.   Review of Systems: As per HPI; otherwise review of systems reviewed and negative.   Ambulatory Status:  Ambulates with a cane  COVID Vaccine Status:   Complete  Past Medical History:  Diagnosis Date  . Alzheimer disease ([place])   . CHF (congestive heart failure) ([place])   . Chronic kidney disease    mild insuffiency  . Coronary artery disease    LHC 4/10: Mid LAD 40-50%, then 70%, OM1 20-30%, EF 65%. Mid LAD FFR 0.89 (not hemodynamically significant). Medical therapy was continued.  . Hyperlipidemia   . LBBB (left bundle branch block) 06/2018  . Obstructive sleep apnea     Past Surgical History:  Procedure Laterality Date  . CARDIAC CATHETERIZATION  05/02/2003   single vessel,moderate stenosis mid LAD  . CARDIAC CATHETERIZATION  06/12/2008   continue med. therapy  . right shoulder surgery    . TOTAL HIP ARTHROPLASTY Left 09/14/2018   Procedure: LEFT TOTAL HIP ARTHROPLASTY ANTERIOR APPROACH;  Surgeon: Mcarthur Rossetti, MD;  Location: [facility];  Service: Orthopedics;  Laterality: Left;    Social History   Socioeconomic History  . Marital status: Widowed    Spouse name: Not on  file  . Number of children: 2  . Years of education: Not on file  . Highest education level: Not on file  Occupational History  . Occupation: [employer]  Tobacco Use  . Smoking status: Former Smoker    Years: 40.00    Types: Pipe    Quit date: 02/28/1990    Years since quitting: 30.4  . Smokeless tobacco: Never Used  . Tobacco comment: smoked pipe only   Vaping Use  . Vaping Use: Never used  Substance and Sexual Activity  . Alcohol use: No    Alcohol/week: 0.0 standard drinks    Comment: rarely wine  . Drug use: No   . Sexual activity: Not on file  Other Topics Concern  . Not on file  Social History Narrative  . Not on file   Social Determinants of Health   Financial Resource Strain: Not on file  Food Insecurity: Not on file  Transportation Needs: Not on file  Physical Activity: Not on file  Stress: Not on file  Social Connections: Not on file  Intimate Partner Violence: Not on file    Allergies  Allergen Reactions  . Darifenacin     Other reaction(s): Mental Status Changes (intolerance) PT STATES IT MAKES HIM "BONKERS" AND VERY TIRED  . Enablex [Darifenacin Hydrobromide Er]     PT STATES IT MAKES HIM "BONKERS" AND VERY TIRED  . Ceftriaxone     Unknown reaction  . Atorvastatin     Muscle aches/weakness  . Ezetimibe     Other reaction(s): Other (See Comments) Muscle aches/weakness  . Levaquin [Levofloxacin] Diarrhea    Caused diarheer a   . Other     Intolerance to zetia and lipitor  . Oxybutynin Other (See Comments)    Memory impairment, temperament changes  . Sulfamethoxazole     Other reaction(s): Other (See Comments) Childhood allergy  . Sulfasalazine     Other reaction(s): Other (See Comments) Unknown per daughter  . Sulfonamide Derivatives     Family History  Problem Relation Age of Onset  . Cancer Mother   . Hypertension Mother   . Other Father        old age  . Hypertension Father     Prior to Admission medications   Medication Sig Start Date End Date Taking? Authorizing Provider  rosuvastatin (CRESTOR) 10 MG tablet TAKE 1/2 TABLET BY MOUTH EVERY OTHER DAY 06/22/20   [place], Peter M, MD  Ascorbic Acid (VITAMIN C) 1000 MG tablet Take 1,000 mg by mouth daily.    [provider]  aspirin 81 MG chewable tablet Chew 1 tablet (81 mg total) by mouth 2 (two) times daily. 09/16/18   Pete Pelt, PA-C  calcium-vitamin D (OSCAL WITH D) 500-200 MG-UNIT per tablet Take 1 tablet by mouth 2 (two) times daily.    [provider]  clindamycin (CLEOCIN)  150 MG capsule Take by mouth 3 (three) times daily.    [provider]  docusate sodium (COLACE) 100 MG capsule Take 100 mg by mouth 2 (two) times daily.    [provider]  donepezil (ARICEPT) 10 MG tablet Take 1 tablet by mouth daily. 12/01/16   [provider]  fesoterodine (TOVIAZ) 8 MG TB24 tablet Take by mouth. 12/13/18   [provider]  fluticasone (FLONASE) 50 MCG/ACT nasal spray Place 2 sprays into both nostrils daily. 05/15/17   [provider]  furosemide (LASIX) 20 MG tablet Take 20 mg by mouth every other day.  04/29/14   [place], Peter M, MD  lisinopril (ZESTRIL) 20 MG tablet Take 20 mg by mouth daily. 08/23/18   [provider]  metroNIDAZOLE (METROGEL) 1 % gel APPLY TOPICALLY D 01/15/19   [provider]  mirabegron ER (MYRBETRIQ) 25 MG TB24 tablet Take 25 mg by mouth daily.  08/29/18   [provider]  Multiple Vitamin (MULTIVITAMIN) tablet Take 1 tablet by mouth daily.    [provider]  Omega-3 Fatty Acids (FISH OIL) 600 MG CAPS Take 1 capsule by mouth 2 (two) times daily.    [provider]  Psyllium (METAMUCIL PO) Take 5 mLs by mouth daily.    [provider]  Saw Palmetto 450 MG CAPS Take 1 capsule by mouth 2 (two) times daily.     [provider]    Physical Exam: Vitals:   07/27/20 0557 07/27/20 0700 07/27/20 0750 07/27/20 0752  BP: (!) 127/57 131/69  (!) 137/55  Pulse: 88 87  85  Resp: (!) 28 (!) 32  20  Temp: (!) 102.8 F (39.3 C)  98.7 F (37.1 C) 99.1 F (37.3 C)  TempSrc: Oral  Oral Oral  SpO2: 95% 96%  96%  Weight: 92.5 kg     Height: 5\' 10"  (1.778 m)        . General:  Appears calm and comfortable and is in NAD, mostly nonsensical speech, oriented x 1 after being awakened . Eyes:   EOMI, normal lids, iris . ENT:  grossly normal hearing, lips & tongue, mmm; edema/fluctuance of L parotid region with mild overlying erythema     . Neck:  no LAD, masses  or thyromegaly . Cardiovascular:  RRR, no m/r/g. No LE edema.  Marland Kitchen Respiratory:   CTA bilaterally with no wheezes/rales/rhonchi.  Normal respiratory effort. . Abdomen:  soft, NT, ND . Skin:  no rash or induration seen on limited exam other than as described above . Musculoskeletal:  grossly normal tone BUE/BLE, good ROM, no bony abnormality . Psychiatric:  confused mood and affect, speech sparse and mostly inappropriate, AOx1 . Neurologic:  Unable to effectively perform    Radiological Exams on Admission: Independently reviewed - see discussion in A/P where applicable  DG Chest Port 1 View  Result Date: 07/26/2020 CLINICAL DATA:  80 year old male with concern for sepsis. EXAM: PORTABLE CHEST 1 VIEW COMPARISON:  Chest radiograph dated 04/21/2020. FINDINGS: Shallow inspiration with bibasilar atelectasis. No focal consolidation, pleural effusion, or pneumothorax. Stable cardiac silhouette. No acute osseous pathology. IMPRESSION: Bibasilar atelectasis. Electronically Signed   By: Anner Crete M.D.   On: 07/26/2020 21:26   CT Maxillofacial Wo Contrast  Result Date: 07/26/2020 CLINICAL DATA:  80 year old male with facial/maxillary abscess. EXAM: CT MAXILLOFACIAL WITHOUT CONTRAST TECHNIQUE: Multidetector CT imaging of the maxillofacial structures was performed. Multiplanar CT image reconstructions were also generated. COMPARISON:  None. FINDINGS: Evaluation of this exam is limited in the absence of intravenous contrast. Osseous: No acute fracture or subluxation. Degenerative changes of the TMJs bilaterally. Orbits: The globes and retro-orbital fat are preserved. Sinuses: Clear. Soft tissues: Small pocket air along the floor of the mouth the right abutting the right mandible. No drainable fluid collection or abscess. Limited intracranial: No significant or unexpected finding. IMPRESSION: Small pocket of air along the floor of the mouth the right abutting the right mandible. No drainable fluid  collection or abscess. Electronically Signed   By: Anner Crete M.D.   On: 07/26/2020 21:32    EKG: Independently reviewed.  Irregular  rhythm with rate 87; LBBB that is new since 2017   Labs on Admission: I have personally reviewed the available labs and imaging studies at the time of the admission.  Pertinent labs:   Glucose 173 BUN 22/Creatinine 1.26/GFR 57 Lactate 1.6 WBC 13.2 INR 1.1 COVID/flu negative UA: small LE   Assessment/Plan Principal Problem:   Acute parotitis Active Problems:   Dyslipidemia   Stage 3a chronic kidney disease (HCC)   Benign essential hypertension   Chronic diastolic CHF (congestive heart failure) (HCC)   Alzheimer's dementia (HCC)   Facial cellulitis   Parotitis with facial cellulitis -Patient was d/w ENT on call, Dr. Fredric Dine -Parotitis in the elderly is usually associated with dehydration, often due to decreased PO intake and use of diuretics.   -Significant oral hydration in important, push PO fluids.   -Include lemon in all water, suck on lemons (sialogogues).   -Warm compresses q2h  -Massage is really important - order q2h.  Gland gets acutely infected, with pus around duct - massage by cupping fingers in front of ear and pulling toward chin.   -Continue Vanc/Zosyn for now given persistent/refractory infection -If effective with strategies above, patient may be better within 1-2 days; if strategies are not aggressively pursued, patients can require prolonged hospitalization for this issue. -Will admit, given outpatient treatment failure.  Dementia -Continue Aricept -He is currently more confused than baseline - likely associated with infection, decreased sleep overnight, and change in environment  BPH -Continue Toviaz, mirabegron  HTN -Continue Lisinopril  HLD -Continue Crestor -Hold fish oil due to limited inpatient utility  Stage 3a CKD -Appears to be stable or slightly worse than usual baseline  Chronic diastolic  CHF/CAD -Continue ASA -Hold Lasix due to need for increased hydration in the setting of parotitis    Note: This patient has been tested and is negative for the novel coronavirus COVID-19. The patient has been fully vaccinated against COVID-19.   Level of care: Telemetry Medical DVT prophylaxis:  Lovenox Code Status:  Full - confirmed with family Family Communication: None present; I spoke with the patient's daughter by telephone at the time of admission. Disposition Plan:  The patient is from: home  Anticipated d/c is to: home without [facility] services   Anticipated d/c date will depend on clinical response to treatment, likely 1-2 more days  Patient is currently: acutely ill Consults called: ENT by telephone only Admission status:  Admit - It is my clinical opinion that admission to [place] is reasonable and necessary because of the expectation that this patient will require hospital care that crosses at least 2 midnights to treat this condition based on the medical complexity of the problems presented.  Given the aforementioned information, the predictability of an adverse outcome is felt to be significant.    Karmen Bongo MD [facility]   How to contact the [facility] Attending or Consulting provider [place] or covering provider during after hours [place], for this patient?  1. Check the care team in [employer] and look for a) attending/consulting [facility] provider listed and b) the [facility] team listed 2. Log into [email or web address] and use 's universal password to access. If you do not have the password, please contact the hospital operator. 3. Locate the [facility] provider you are looking for under [facility] and page to a number that you can be directly reached. 4. If you still have difficulty reaching the provider, please page the [facility] (Director on Call) for the Hospitalists listed on amion  for assistance.   07/27/2020, 8:52 AM

## 2020-07-28 LAB — CBC
HCT: 41.4 % (ref 39.0–52.0)
Hemoglobin: 13.8 g/dL (ref 13.0–17.0)
MCH: 31.4 pg (ref 26.0–34.0)
MCHC: 33.3 g/dL (ref 30.0–36.0)
MCV: 94.3 fL (ref 80.0–100.0)
Platelets: 155 10*3/uL (ref 150–400)
RBC: 4.39 MIL/uL (ref 4.22–5.81)
RDW: 12.9 % (ref 11.5–15.5)
WBC: 12.8 10*3/uL — ABNORMAL HIGH (ref 4.0–10.5)
nRBC: 0 % (ref 0.0–0.2)

## 2020-07-28 LAB — BASIC METABOLIC PANEL
Anion gap: 11 (ref 5–15)
BUN: 19 mg/dL (ref 8–23)
CO2: 20 mmol/L — ABNORMAL LOW (ref 22–32)
Calcium: 9.2 mg/dL (ref 8.9–10.3)
Chloride: 103 mmol/L (ref 98–111)
Creatinine, Ser: 1.3 mg/dL — ABNORMAL HIGH (ref 0.61–1.24)
GFR, Estimated: 55 mL/min — ABNORMAL LOW (ref >60)
Glucose, Bld: 144 mg/dL — ABNORMAL HIGH (ref 70–99)
Potassium: 4.1 mmol/L (ref 3.5–5.1)
Sodium: 134 mmol/L — ABNORMAL LOW (ref 135–145)

## 2020-07-28 LAB — GLUCOSE, CAPILLARY
Glucose-Capillary: 141 mg/dL — ABNORMAL HIGH (ref 70–99)
Glucose-Capillary: 170 mg/dL — ABNORMAL HIGH (ref 70–99)
Glucose-Capillary: 158 mg/dL — ABNORMAL HIGH (ref 70–99)
Glucose-Capillary: 172 mg/dL — ABNORMAL HIGH (ref 70–99)

## 2020-07-28 LAB — URINE CULTURE: Culture: <10000 — AB

## 2020-07-28 NOTE — Progress Notes (Signed)
[facility]  PROGRESS NOTE  [name] [name] MWU:[id number] DOB: 03-07-1940 DOA: 07/26/2020 PCP: Chesley Noon, MD   Brief HPI:   80 year old male with medical history of OSA, hyperlipidemia, CAD, chronic diastolic CHF, dementia with recent diagnosis of parotitis presented with outpatient treatment failure.  He was treated with Augmentin as outpatient for left-sided facial swelling.  He was also referred to ENT for appointment on 08/05/2020.  He did not improve so he came back to ED and was given clindamycin in addition to Augmentin.  Patient woke up yesterday with pain and also fever 102.6.  He was started on vancomycin and Zosyn and transferred to [facility].   Subjective   Patient seen and examined, denies any pain at this time.   Assessment/Plan:     Parotitis with facial cellulitis -Admitting provider discussed with ENT Dr. Isaias Cowman -Parotitis in the elderly is usually associated with dehydration often due to decreased p.o. intake and use of diuretics -Needs significant oral hydration, push p.o. fluids -Include lemon and all water, suck on lemon(sialagogues) -Warm compresses every 2 hours -Massage is important, every 2 hours -Gland gets infected with pus around duct, massage with cupping fingers in front of ear and pulling towards chin -If effective with strategies above patient may be better with 1 to 2 days -If strategies are not aggressively pursued, patient can require prolonged hospitalization for this issue -Continue vancomycin and Zosyn   Dementia -No behavior disturbance; continue Aricept   Hypertension -Continue lisinopril; blood pressure controlled   BPH -Continue Toviaz, mirabegron   Diabetes mellitus type 2 -Continue sliding scale insulin with NovoLog -CBG well controlled   CKD stage IIIa -Stable\   Chronic diastolic CHF -Lasix on hold to prevent dehydration in setting of parotitis   CAD -Continue aspirin  Scheduled  medications:   . aspirin  81 mg Oral BID  . docusate sodium  100 mg Oral Daily  . donepezil  10 mg Oral QHS  . enoxaparin (LOVENOX) injection  40 mg Subcutaneous Q24H  . fesoterodine  8 mg Oral Daily  . insulin aspart  0-15 Units Subcutaneous TID WC  . lisinopril  20 mg Oral Daily  . mirabegron ER  25 mg Oral Daily  . rosuvastatin  5 mg Oral QODAY  . sodium chloride flush  3 mL Intravenous Q12H         Data Reviewed:   CBG:  Recent Labs  Lab 07/27/20 1140 07/27/20 1611 07/27/20 1944 07/28/20 0628 07/28/20 1112  GLUCAP 148* 147* 148* 141* 170*    SpO2: 93 %    Vitals:   07/28/20 0313 07/28/20 0500 07/28/20 0854 07/28/20 1111  BP: (!) 154/72 (!) 139/58 121/67 137/61  Pulse: 90 80 74 70  Resp: 14 20 14 20   Temp: 99.6 F (37.6 C)  100 F (37.8 C) 98.4 F (36.9 C)  TempSrc: Oral  Oral Oral  SpO2: 96% 97% 96% 93%  Weight:      Height:         Intake/Output Summary (Last 24 hours) at 07/28/2020 1624 Last data filed at 07/28/2020 1116 Gross per 24 hour  Intake 210.98 ml  Output 1401 ml  Net -1190.02 ml    05/29 1901 - 05/31 0700 In: 1826 [P.O.:320; I.V.:454.8] Out: 1175 [Urine:1175]  Filed Weights   07/26/20 2038 07/27/20 0251 07/27/20 0557  Weight: 95.2 kg 95.2 kg 92.5 kg    CBC:  Recent Labs  Lab 07/26/20 2052 07/28/20 0119  WBC 13.2* 12.8*  HGB 14.4 13.8  HCT 42.6 41.4  PLT 220 155  MCV 92.6 94.3  MCH 31.3 31.4  MCHC 33.8 33.3  RDW 13.1 12.9  LYMPHSABS 0.8  --   MONOABS 0.8  --   EOSABS 0.0  --   BASOSABS 0.0  --     Complete metabolic panel:  Recent Labs  Lab 07/26/20 2052 07/27/20 0639 07/27/20 0850 07/28/20 0119  NA 135  --   --  134*  K 4.2  --   --  4.1  CL 99  --   --  103  CO2 27  --   --  20*  GLUCOSE 173*  --   --  144*  BUN 22  --   --  19  CREATININE 1.26*  --   --  1.30*  CALCIUM 10.4*  --   --  9.2  AST 17  --   --   --   ALT 23  --   --   --   ALKPHOS 67  --   --   --   BILITOT 0.9  --   --   --    ALBUMIN 4.5  --   --   --   PROCALCITON  --   --  0.49  --   LATICACIDVEN 1.6 1.6  --   --   INR 1.1  --   --   --     No results for input(s): LIPASE, AMYLASE in the last 168 hours.  Recent Labs  Lab 07/26/20 2105 07/27/20 0850  PROCALCITON  --  0.49  SARSCOV2NAA NEGATIVE  --     ------------------------------------------------------------------------------------------------------------------ No results for input(s): CHOL, HDL, LDLCALC, TRIG, CHOLHDL, LDLDIRECT in the last 72 hours.  No results found for: HGBA1C ------------------------------------------------------------------------------------------------------------------ No results for input(s): TSH, T4TOTAL, T3FREE, THYROIDAB in the last 72 hours.  Invalid input(s): FREET3 ------------------------------------------------------------------------------------------------------------------ No results for input(s): VITAMINB12, FOLATE, FERRITIN, TIBC, IRON, RETICCTPCT in the last 72 hours.  Coagulation profile Recent Labs  Lab 07/26/20 2052  INR 1.1   No results for input(s): DDIMER in the last 72 hours.  Cardiac Enzymes No results for input(s): CKTOTAL, CKMB, CKMBINDEX, TROPONINI in the last 168 hours.  ------------------------------------------------------------------------------------------------------------------ No results found for: BNP   Antibiotics: Anti-infectives (From admission, onward)   Start     Dose/Rate Route Frequency Ordered Stop   07/27/20 2200  vancomycin (VANCOREADY) IVPB 1250 mg/250 mL  Status:  Discontinued        1,250 mg 166.7 mL/hr over 90 Minutes Intravenous Every 24 hours 07/26/20 2103 07/27/20 1004   07/27/20 2200  vancomycin (VANCOREADY) IVPB 1000 mg/200 mL        1,000 mg 200 mL/hr over 60 Minutes Intravenous Every 24 hours 07/27/20 1004     07/27/20 1400  piperacillin-tazobactam (ZOSYN) IVPB 3.375 g        3.375 g 12.5 mL/hr over 240 Minutes Intravenous Every 8 hours 07/27/20 0954      07/26/20 2130  vancomycin (VANCOCIN) IVPB 1000 mg/200 mL premix        1,000 mg 200 mL/hr over 60 Minutes Intravenous Every 1 hr x 2 07/26/20 2059 07/27/20 0000   07/26/20 2100  piperacillin-tazobactam (ZOSYN) IVPB 3.375 g        3.375 g 100 mL/hr over 30 Minutes Intravenous  Once 07/26/20 2054 07/26/20 2200       Radiology Reports  DG Chest Port 1 View  Result Date: 07/26/2020 CLINICAL DATA:  80 year old male with concern  for sepsis. EXAM: PORTABLE CHEST 1 VIEW COMPARISON:  Chest radiograph dated 04/21/2020. FINDINGS: Shallow inspiration with bibasilar atelectasis. No focal consolidation, pleural effusion, or pneumothorax. Stable cardiac silhouette. No acute osseous pathology. IMPRESSION: Bibasilar atelectasis. Electronically Signed   By: Anner Crete M.D.   On: 07/26/2020 21:26   CT Maxillofacial Wo Contrast  Result Date: 07/26/2020 CLINICAL DATA:  80 year old male with facial/maxillary abscess. EXAM: CT MAXILLOFACIAL WITHOUT CONTRAST TECHNIQUE: Multidetector CT imaging of the maxillofacial structures was performed. Multiplanar CT image reconstructions were also generated. COMPARISON:  None. FINDINGS: Evaluation of this exam is limited in the absence of intravenous contrast. Osseous: No acute fracture or subluxation. Degenerative changes of the TMJs bilaterally. Orbits: The globes and retro-orbital fat are preserved. Sinuses: Clear. Soft tissues: Small pocket air along the floor of the mouth the right abutting the right mandible. No drainable fluid collection or abscess. Limited intracranial: No significant or unexpected finding. IMPRESSION: Small pocket of air along the floor of the mouth the right abutting the right mandible. No drainable fluid collection or abscess. Electronically Signed   By: Anner Crete M.D.   On: 07/26/2020 21:32      DVT prophylaxis: Lovenox  Code Status: Full code  Family Communication: No family at  bedside   Consultants:    Procedures:      Objective    Physical Examination:    General-appears in no acute distress  Heart-S1-S2, regular, no murmur auscultated  Lungs-clear to auscultation bilaterally, no wheezing or crackles auscultated  Abdomen-soft, nontender, no organomegaly  Extremities-no edema in the lower extremities  Neuro-alert, oriented x3, no focal deficit noted   Status is: Inpatient  Dispo: The patient is from: Home              Anticipated d/c is to: Home              Anticipated d/c date is: 07/31/2020              Patient currently not stable for discharge  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  Lab Results  Component Value Date   [place] NEGATIVE 07/26/2020   [place] NEGATIVE 09/11/2018    Microbiology  Recent Results (from the past 240 hour(s))  Blood Culture (routine x 2)     Status: None (Preliminary result)   Collection Time: 07/26/20  8:52 PM   Specimen: BLOOD  Result Value Ref Range Status   Specimen Description   Final    BLOOD LEFT ANTECUBITAL Performed at [facility], [address], [place], [place] [address]    Special Requests   Final    BOTTLES DRAWN AEROBIC AND ANAEROBIC Blood Culture adequate volume Performed at [facility], [address], [place], [place] [address]    Culture   Final    NO GROWTH < 24 HOURS Performed at [facility], [place] [address] [place], [place] [address]    Report Status PENDING  Incomplete  Urine culture     Status: Abnormal   Collection Time: 07/26/20  8:52 PM   Specimen: Urine, Catheterized  Result Value Ref Range Status   Specimen Description   Final    URINE, CATHETERIZED Performed at [facility], [address] [place], [place] [address]    Special Requests   Final    NONE Performed at [facility], [address] [place], [place] [address]  Culture (A)  Final    <10,000 COLONIES/mL INSIGNIFICANT GROWTH Performed at [facility] [address] [place], [place] [address]    Report Status 07/28/2020 FINAL  Final  Blood Culture (routine x 2)     Status: None (Preliminary result)   Collection Time: 07/26/20  8:57 PM   Specimen: BLOOD  Result Value Ref Range Status   Specimen Description   Final    BLOOD RIGHT HAND Performed at [facility], [address], [place], [place] [address]    Special Requests   Final    BOTTLES DRAWN AEROBIC AND ANAEROBIC Blood Culture adequate volume Performed at [facility], [address] [place], [place] [address]    Culture   Final    NO GROWTH < 24 HOURS Performed at [facility], [place] [address] [place], [place] [address]    Report Status PENDING  Incomplete  Resp Panel by RT-PCR (Flu A&B, Covid) Nasopharyngeal Swab     Status: None   Collection Time: 07/26/20  9:05 PM   Specimen: Nasopharyngeal Swab; Nasopharyngeal(NP) swabs in vial transport medium  Result Value Ref Range Status   SARS Coronavirus 2 by RT PCR NEGATIVE NEGATIVE Final    Comment: (NOTE) SARS-CoV-2 target nucleic acids are NOT DETECTED.  The SARS-CoV-2 RNA is generally detectable in upper respiratory specimens during the acute phase of infection. The lowest concentration of SARS-CoV-2 viral copies this assay can detect is 138 copies/mL. A negative result does not preclude SARS-Cov-2 infection and should not be used as the sole basis for treatment or other patient management decisions. A negative result may occur with  improper specimen collection/handling, submission of specimen other than nasopharyngeal swab, presence of viral mutation(s) within the areas targeted by this assay, and inadequate number of viral copies(<138 copies/mL). A negative result must be combined with clinical observations,  patient history, and epidemiological information. The expected result is Negative.  Fact Sheet for Patients:  EntrepreneurPulse.com.au  Fact Sheet for Healthcare Providers:  IncredibleEmployment.be  This test is no t yet approved or cleared by the [place] FDA and  has been authorized for detection and/or diagnosis of SARS-CoV-2 by FDA under an Emergency Use Authorization (EUA). This EUA will remain  in effect (meaning this test can be used) for the duration of the COVID-19 declaration under Section 564(b)(1) of the Act, 21 U.S.C.section 360bbb-3(b)(1), unless the authorization is terminated  or revoked sooner.       Influenza A by PCR NEGATIVE NEGATIVE Final   Influenza B by PCR NEGATIVE NEGATIVE Final    Comment: (NOTE) The Xpert Xpress SARS-CoV-2/FLU/RSV plus assay is intended as an aid in the diagnosis of influenza from Nasopharyngeal swab specimens and should not be used as a sole basis for treatment. Nasal washings and aspirates are unacceptable for Xpert Xpress SARS-CoV-2/FLU/RSV testing.  Fact Sheet for Patients: EntrepreneurPulse.com.au  Fact Sheet for Healthcare Providers: IncredibleEmployment.be  This test is not yet approved or cleared by the [place] FDA and has been authorized for detection and/or diagnosis of SARS-CoV-2 by FDA under an Emergency Use Authorization (EUA). This EUA will remain in effect (meaning this test can be used) for the duration of the COVID-19 declaration under Section 564(b)(1) of the Act, 21 U.S.C. section 360bbb-3(b)(1), unless the authorization is terminated or revoked.  Performed at [facility], [address], [place], [place] [address]              Oswald Hillock  [facility] If 7PM-7AM, please contact night-coverage at [email or web address], Office  [phone]   07/28/2020, 4:24 PM  LOS: 1 day

## 2020-07-28 NOTE — Progress Notes (Signed)
Mobility Specialist: Progress Note   07/28/20 1647  Mobility  Activity Ambulated in hall  Level of Assistance Moderate assist, patient does 50-74%  Assistive Device Front wheel walker  Distance Ambulated (ft) 330 ft  Mobility Ambulated independently in hallway  Mobility Response Tolerated well  Mobility performed by Mobility specialist  Bed Position Chair  $Mobility charge 1 Mobility   Pre-Mobility: 79 HR, 96% SpO2 Post-Mobility: 96 HR, 98% SpO2  Pt was modA sit EOB from supine as well as to stand from EOB. Pt was standby assist during ambulation though, asx throughout. Pt to chair after walk to sit up for dinner per RN request. Pt has call bell and phone in reach with NT present in room.   [facility] Allie Ousley Mobility Specialist Mobility Specialist Phone: [phone]

## 2020-07-29 LAB — GLUCOSE, CAPILLARY
Glucose-Capillary: 141 mg/dL — ABNORMAL HIGH (ref 70–99)
Glucose-Capillary: 189 mg/dL — ABNORMAL HIGH (ref 70–99)
Glucose-Capillary: 188 mg/dL — ABNORMAL HIGH (ref 70–99)

## 2020-07-29 LAB — BASIC METABOLIC PANEL
Anion gap: 9 (ref 5–15)
BUN: 19 mg/dL (ref 8–23)
CO2: 25 mmol/L (ref 22–32)
Calcium: 9 mg/dL (ref 8.9–10.3)
Chloride: 102 mmol/L (ref 98–111)
Creatinine, Ser: 1.24 mg/dL (ref 0.61–1.24)
GFR, Estimated: 58 mL/min — ABNORMAL LOW (ref >60)
Glucose, Bld: 129 mg/dL — ABNORMAL HIGH (ref 70–99)
Potassium: 3.8 mmol/L (ref 3.5–5.1)
Sodium: 136 mmol/L (ref 135–145)

## 2020-07-29 LAB — CBC
HCT: 40.3 % (ref 39.0–52.0)
Hemoglobin: 13.2 g/dL (ref 13.0–17.0)
MCH: 30.8 pg (ref 26.0–34.0)
MCHC: 32.8 g/dL (ref 30.0–36.0)
MCV: 93.9 fL (ref 80.0–100.0)
Platelets: 163 10*3/uL (ref 150–400)
RBC: 4.29 MIL/uL (ref 4.22–5.81)
RDW: 13 % (ref 11.5–15.5)
WBC: 9.3 10*3/uL (ref 4.0–10.5)
nRBC: 0 % (ref 0.0–0.2)

## 2020-07-29 NOTE — Progress Notes (Signed)
Help patient put on his home CPAP.

## 2020-07-29 NOTE — Progress Notes (Signed)
Mobility Specialist - Progress Note   07/29/20 1057  Mobility  Activity Ambulated in hall  Level of Assistance Minimal assist, patient does 75% or more  Assistive Device Front wheel walker  Distance Ambulated (ft) 300 ft  Mobility Ambulated with assistance in hallway  Mobility Response Tolerated well  Mobility performed by Mobility specialist  $Mobility charge 1 Mobility   Pre-mobility: 96 HR During mobility: 121 HR Post-mobility: 95 HR  Pt was min assist to stand from the recliner. Distance limited by fatigue, 2/4 DOE. Pt to chair after walk, call bell at side, he expressed understanding to use the call bell whenever he'd like to get out of the chair for any reason.   Pricilla Handler Mobility Specialist Mobility Specialist Phone: [phone]

## 2020-07-29 NOTE — Progress Notes (Signed)
PROGRESS NOTE    [name] [name]  SWF:[id number] DOB: 09-21-1940 DOA: 07/26/2020 PCP: Chesley Noon, MD   Brief Narrative:  80 year old male with medical history of OSA, hyperlipidemia, CAD, chronic diastolic CHF, dementia with recent diagnosis of parotitis presented with outpatient treatment failure.  He was treated with Augmentin as outpatient for left-sided facial swelling.  He was also referred to ENT for appointment on 08/05/2020.  He did not improve so he came back to ED and was given clindamycin in addition to Augmentin but continued to have worsening pain and fever of 102.6.  He was started on vancomycin and Zosyn and transferred to [place]:   Principal Problem:   Acute parotitis Active Problems:   Dyslipidemia   Stage 3a chronic kidney disease ([place])   Benign essential hypertension   Chronic diastolic CHF (congestive heart failure) (HCC)   Alzheimer's dementia (HCC)   Facial cellulitis  Parotitis with facial cellulitis -Admitting provider discussed with ENT Dr. Isaias Cowman:  -Parotitis in the elderly is usually associated with dehydration often due to decreased p.o. intake and use of diuretics  -Needs significant oral hydration, push p.o. fluids  -Include lemon and all water, suck on lemon (or other sialagogues)  -Warm compresses every 2 hours  -Massage is important, every 2 hours  -Gland gets infected with pus around duct, massage with cupping fingers in front of ear and pulling towards chin  -If effective with strategies above patient may be better with 1 to 2 days  -If strategies are not aggressively pursued, patient can require prolonged hospitalization for this issue  -Continue vancomycin and Zosyn -we will discuss with ID for possible de-escalation and antibiotic duration recommendations in the next 24 to 48 hours pending patient's clinical improvement.  Dementia, unspecified -No behavior disturbance; continue  Aricept  Hypertension -Continue lisinopril; blood pressure controlled  BPH -Continue Toviaz, mirabegron  Diabetes mellitus type 2 -Continue sliding scale insulin with NovoLog -CBG well controlled  CKD stage IIIa -Stable\  Chronic diastolic CHF, not in acute exacerbation -Lasix on hold to prevent dehydration in setting of parotitis  CAD -Continue aspirin   DVT prophylaxis: Lovenox Code Status: Full Family Communication: None available  Status is: Inpatient  Dispo: The patient is from: Home lives with daughter/family              Anticipated d/c is to: To be determined              Anticipated d/c date is: 48 to 72 hours              Patient currently not medically stable for discharge  Consultants:   ENT consulted at admission for recommendations  Procedures:   None  Antimicrobials:  Vancomycin, Zosyn  Subjective: No acute issues or events overnight, patient indicates he is feeling better, swelling is decreased but not yet resolved.  Otherwise denies nausea vomiting diarrhea constipation headache fevers or chills.  Objective: Vitals:   07/28/20 2340 07/28/20 2341 07/29/20 0000 07/29/20 0349  BP:  (!) 112/52  136/73  Pulse:    95  Resp: 20  20 20   Temp:  97.9 F (36.6 C)  99.3 F (37.4 C)  TempSrc:  Oral  Axillary  SpO2:  98%  95%  Weight:      Height:        Intake/Output Summary (Last 24 hours) at 07/29/2020 0748 Last data filed at 07/29/2020 0622 Gross per 24 hour  Intake 580 ml  Output  3330 ml  Net -2750 ml   Filed Weights   07/26/20 2038 07/27/20 0251 07/27/20 0557  Weight: 95.2 kg 95.2 kg 92.5 kg    Examination:  General:  Pleasantly resting in bed, No acute distress. HEENT: Bilateral edema of the bilateral parotid regions, left greater than right without overt pain upon examination.  Patient's voice is somewhat muffled and "hot potato" in nature Neck: Edematous at the submandibular area improving anteriorly Lungs:  Clear to  auscultate bilaterally without rhonchi, wheeze, or rales. Heart:  Regular rate and rhythm.  Without murmurs, rubs, or gallops. Abdomen:  Soft, nontender, nondistended.  Without guarding or rebound. Extremities: Without cyanosis, clubbing, edema, or obvious deformity. Vascular:  Dorsalis pedis and posterior tibial pulses palpable bilaterally. Skin:  Warm and dry, no erythema, no ulcerations.  Data Reviewed: I have personally reviewed following labs and imaging studies  CBC: Recent Labs  Lab 07/26/20 2052 07/28/20 0119 07/29/20 0157  WBC 13.2* 12.8* 9.3  NEUTROABS 11.5*  --   --   HGB 14.4 13.8 13.2  HCT 42.6 41.4 40.3  MCV 92.6 94.3 93.9  PLT 220 155 502   Basic Metabolic Panel: Recent Labs  Lab 07/26/20 2052 07/28/20 0119 07/29/20 0157  NA 135 134* 136  K 4.2 4.1 3.8  CL 99 103 102  CO2 27 20* 25  GLUCOSE 173* 144* 129*  BUN 22 19 19   CREATININE 1.26* 1.30* 1.24  CALCIUM 10.4* 9.2 9.0   GFR: Estimated Creatinine Clearance: 52.5 mL/min (by C-G formula based on SCr of 1.24 mg/dL). Liver Function Tests: Recent Labs  Lab 07/26/20 2052  AST 17  ALT 23  ALKPHOS 67  BILITOT 0.9  PROT 7.6  ALBUMIN 4.5   No results for input(s): LIPASE, AMYLASE in the last 168 hours. No results for input(s): AMMONIA in the last 168 hours. Coagulation Profile: Recent Labs  Lab 07/26/20 2052  INR 1.1   Cardiac Enzymes: No results for input(s): CKTOTAL, CKMB, CKMBINDEX, TROPONINI in the last 168 hours. BNP (last 3 results) Recent Labs    04/21/20 1510  PROBNP 100   HbA1C: No results for input(s): HGBA1C in the last 72 hours. CBG: Recent Labs  Lab 07/28/20 0628 07/28/20 1112 07/28/20 1648 07/28/20 2113 07/29/20 0618  GLUCAP 141* 170* 158* 172* 141*   Lipid Profile: No results for input(s): CHOL, HDL, LDLCALC, TRIG, CHOLHDL, LDLDIRECT in the last 72 hours. Thyroid Function Tests: No results for input(s): TSH, T4TOTAL, FREET4, T3FREE, THYROIDAB in the last 72  hours. Anemia Panel: No results for input(s): VITAMINB12, FOLATE, FERRITIN, TIBC, IRON, RETICCTPCT in the last 72 hours. Sepsis Labs: Recent Labs  Lab 07/26/20 2052 07/27/20 0639 07/27/20 0850  PROCALCITON  --   --  0.49  LATICACIDVEN 1.6 1.6  --     Recent Results (from the past 240 hour(s))  Blood Culture (routine x 2)     Status: None (Preliminary result)   Collection Time: 07/26/20  8:52 PM   Specimen: BLOOD  Result Value Ref Range Status   Specimen Description   Final    BLOOD LEFT ANTECUBITAL Performed at [facility], [address] [place], [place] [address]    Special Requests   Final    BOTTLES DRAWN AEROBIC AND ANAEROBIC Blood Culture adequate volume Performed at [facility], [address] [place], [place] [address]    Culture   Final    NO GROWTH 2 DAYS Performed at [facility], [place],  [place] [address]    Report Status PENDING  Incomplete  Urine culture     Status: Abnormal   Collection Time: 07/26/20  8:52 PM   Specimen: Urine, Catheterized  Result Value Ref Range Status   Specimen Description   Final    URINE, CATHETERIZED Performed at [facility], [address] [place], [place] [address]    Special Requests   Final    NONE Performed at [facility], [address] [place], [place] [address]    Culture (A)  Final    <10,000 COLONIES/mL INSIGNIFICANT GROWTH Performed at [facility] [address], [place], [place] [address]    Report Status 07/28/2020 FINAL  Final  Blood Culture (routine x 2)     Status: None (Preliminary result)   Collection Time: 07/26/20  8:57 PM   Specimen: BLOOD  Result Value Ref Range Status   Specimen Description   Final    BLOOD RIGHT HAND Performed at [facility], [address] [place], [place] [address]    Special Requests   Final    BOTTLES DRAWN AEROBIC AND ANAEROBIC Blood  Culture adequate volume Performed at [facility], [address], [place], [place] [address]    Culture   Final    NO GROWTH 2 DAYS Performed at [facility], [place] [address] [place], [place] [address]    Report Status PENDING  Incomplete  Resp Panel by RT-PCR (Flu A&B, Covid) Nasopharyngeal Swab     Status: None   Collection Time: 07/26/20  9:05 PM   Specimen: Nasopharyngeal Swab; Nasopharyngeal(NP) swabs in vial transport medium  Result Value Ref Range Status   SARS Coronavirus 2 by RT PCR NEGATIVE NEGATIVE Final    Comment: (NOTE) SARS-CoV-2 target nucleic acids are NOT DETECTED.  The SARS-CoV-2 RNA is generally detectable in upper respiratory specimens during the acute phase of infection. The lowest concentration of SARS-CoV-2 viral copies this assay can detect is 138 copies/mL. A negative result does not preclude SARS-Cov-2 infection and should not be used as the sole basis for treatment or other patient management decisions. A negative result may occur with  improper specimen collection/handling, submission of specimen other than nasopharyngeal swab, presence of viral mutation(s) within the areas targeted by this assay, and inadequate number of viral copies(<138 copies/mL). A negative result must be combined with clinical observations, patient history, and epidemiological information. The expected result is Negative.  Fact Sheet for Patients:  EntrepreneurPulse.com.au  Fact Sheet for Healthcare Providers:  IncredibleEmployment.be  This test is no t yet approved or cleared by the [place] FDA and  has been authorized for detection and/or diagnosis of SARS-CoV-2 by FDA under an Emergency Use Authorization (EUA). This EUA will remain  in effect (meaning this test can be used) for the duration of the COVID-19 declaration under Section 564(b)(1) of the Act, 21 U.S.C.section 360bbb-3(b)(1), unless the  authorization is terminated  or revoked sooner.       Influenza A by PCR NEGATIVE NEGATIVE Final   Influenza B by PCR NEGATIVE NEGATIVE Final    Comment: (NOTE) The Xpert Xpress SARS-CoV-2/FLU/RSV plus assay is intended as an aid in the diagnosis of influenza from Nasopharyngeal swab specimens and should not be used as a sole basis for treatment. Nasal washings and aspirates are unacceptable for Xpert Xpress SARS-CoV-2/FLU/RSV testing.  Fact Sheet for Patients: EntrepreneurPulse.com.au  Fact Sheet for Healthcare Providers: IncredibleEmployment.be  This test is not yet approved or cleared by  the [employer] and has been authorized for detection and/or diagnosis of SARS-CoV-2 by FDA under an Emergency Use Authorization (EUA). This EUA will remain in effect (meaning this test can be used) for the duration of the COVID-19 declaration under Section 564(b)(1) of the Act, 21 U.S.C. section 360bbb-3(b)(1), unless the authorization is terminated or revoked.  Performed at [facility], [address], [place], [place] [address]          Radiology Studies: No results found.      Scheduled Meds: . aspirin  81 mg Oral BID  . docusate sodium  100 mg Oral Daily  . donepezil  10 mg Oral QHS  . enoxaparin (LOVENOX) injection  40 mg Subcutaneous Q24H  . fesoterodine  8 mg Oral Daily  . insulin aspart  0-15 Units Subcutaneous TID WC  . lisinopril  20 mg Oral Daily  . mirabegron ER  25 mg Oral Daily  . rosuvastatin  5 mg Oral QODAY  . sodium chloride flush  3 mL Intravenous Q12H   Continuous Infusions: . lactated ringers 50 mL/hr at 07/28/20 1721  . piperacillin-tazobactam (ZOSYN)  IV 3.375 g (07/29/20 0014)  . vancomycin 1,000 mg (07/28/20 2152)     LOS: 2 days   Time spent: 31min  Gwendloyn Forsee C Wendelin Reader, DO [facility]  If 7PM-7AM, please contact night-coverage [email or web address]  07/29/2020, 7:48 AM

## 2020-07-30 ENCOUNTER — Other Ambulatory Visit (HOSPITAL_COMMUNITY): Payer: Self-pay

## 2020-07-30 ENCOUNTER — Inpatient Hospital Stay (HOSPITAL_COMMUNITY): Payer: Medicare Other

## 2020-07-30 DIAGNOSIS — I5032 Chronic diastolic (congestive) heart failure: Secondary | ICD-10-CM

## 2020-07-30 DIAGNOSIS — G308 Other Alzheimer's disease: Secondary | ICD-10-CM

## 2020-07-30 DIAGNOSIS — K1121 Acute sialoadenitis: Secondary | ICD-10-CM | POA: Diagnosis not present

## 2020-07-30 DIAGNOSIS — L03211 Cellulitis of face: Secondary | ICD-10-CM

## 2020-07-30 DIAGNOSIS — I10 Essential (primary) hypertension: Secondary | ICD-10-CM | POA: Diagnosis not present

## 2020-07-30 DIAGNOSIS — E785 Hyperlipidemia, unspecified: Secondary | ICD-10-CM

## 2020-07-30 DIAGNOSIS — F0280 Dementia in other diseases classified elsewhere without behavioral disturbance: Secondary | ICD-10-CM

## 2020-07-30 DIAGNOSIS — N1831 Chronic kidney disease, stage 3a: Secondary | ICD-10-CM

## 2020-07-30 DIAGNOSIS — R220 Localized swelling, mass and lump, head: Secondary | ICD-10-CM

## 2020-07-30 LAB — GLUCOSE, CAPILLARY
Glucose-Capillary: 176 mg/dL — ABNORMAL HIGH (ref 70–99)
Glucose-Capillary: 308 mg/dL — ABNORMAL HIGH (ref 70–99)
Glucose-Capillary: 180 mg/dL — ABNORMAL HIGH (ref 70–99)
Glucose-Capillary: 122 mg/dL — ABNORMAL HIGH (ref 70–99)
Glucose-Capillary: 167 mg/dL — ABNORMAL HIGH (ref 70–99)

## 2020-07-30 LAB — BASIC METABOLIC PANEL
Anion gap: 10 (ref 5–15)
BUN: 20 mg/dL (ref 8–23)
CO2: 21 mmol/L — ABNORMAL LOW (ref 22–32)
Calcium: 8.8 mg/dL — ABNORMAL LOW (ref 8.9–10.3)
Chloride: 101 mmol/L (ref 98–111)
Creatinine, Ser: 1.04 mg/dL (ref 0.61–1.24)
GFR, Estimated: >60 mL/min (ref >60)
Glucose, Bld: 138 mg/dL — ABNORMAL HIGH (ref 70–99)
Potassium: 3.6 mmol/L (ref 3.5–5.1)
Sodium: 132 mmol/L — ABNORMAL LOW (ref 135–145)

## 2020-07-30 LAB — CBC
HCT: 35.6 % — ABNORMAL LOW (ref 39.0–52.0)
Hemoglobin: 11.9 g/dL — ABNORMAL LOW (ref 13.0–17.0)
MCH: 30.9 pg (ref 26.0–34.0)
MCHC: 33.4 g/dL (ref 30.0–36.0)
MCV: 92.5 fL (ref 80.0–100.0)
Platelets: 178 10*3/uL (ref 150–400)
RBC: 3.85 MIL/uL — ABNORMAL LOW (ref 4.22–5.81)
RDW: 13.1 % (ref 11.5–15.5)
WBC: 7.1 10*3/uL (ref 4.0–10.5)
nRBC: 0 % (ref 0.0–0.2)

## 2020-07-30 LAB — VANCOMYCIN, TROUGH: Vancomycin Tr: 7 ug/mL — ABNORMAL LOW (ref 15–20)

## 2020-07-30 LAB — VANCOMYCIN, PEAK: Vancomycin Pk: 20 ug/mL — ABNORMAL LOW (ref 30–40)

## 2020-07-30 MED ORDER — VANCOMYCIN HCL 1250 MG/250ML IV SOLN
1250.0000 mg | INTRAVENOUS | Status: DC
  Administered 2020-07-30: 1250 mg via INTRAVENOUS
  Filled 2020-07-30: qty 250

## 2020-07-30 MED ORDER — GADOBUTROL 1 MMOL/ML IV SOLN
9.0000 mL | Freq: Once | INTRAVENOUS | Status: AC | PRN
  Administered 2020-07-30: 9 mL via INTRAVENOUS

## 2020-07-30 NOTE — Consult Note (Signed)
Date of Admission:  07/26/2020          Reason for Consult: Parotitis    Referring Provider: Dr. Avon Gully   Assessment:  1. Left sided facial mass thought to be parotitis 2. Alzheimer's dementia 3. Chronic kidney disease stage IIIa 4. Hyperlipidemia   Plan:  1. Obtain MRI of the neck to clarify if this is really a parotid gland inflammation or soft tissue mass in the face 2. Narrowed to vancomycin for now  Principal Problem:   Acute parotitis Active Problems:   Dyslipidemia   Stage 3a chronic kidney disease (HCC)   Benign essential hypertension   Chronic diastolic CHF (congestive heart failure) (HCC)   Alzheimer's dementia (HCC)   Facial cellulitis   Scheduled Meds: . aspirin  81 mg Oral BID  . docusate sodium  100 mg Oral Daily  . donepezil  10 mg Oral QHS  . enoxaparin (LOVENOX) injection  40 mg Subcutaneous Q24H  . fesoterodine  8 mg Oral Daily  . insulin aspart  0-15 Units Subcutaneous TID WC  . lisinopril  20 mg Oral Daily  . mirabegron ER  25 mg Oral Daily  . rosuvastatin  5 mg Oral QODAY  . sodium chloride flush  3 mL Intravenous Q12H   Continuous Infusions: . lactated ringers 50 mL/hr at 07/30/20 0700  . piperacillin-tazobactam (ZOSYN)  IV 12.5 mL/hr at 07/30/20 0700  . vancomycin Stopped (07/29/20 2312)   PRN Meds:.acetaminophen **OR** acetaminophen, ondansetron **OR** ondansetron (ZOFRAN) IV  HPI: [name] [name] is a 80 y.o. male history of dementia sleep apnea coronary disease diastolic heart failure chronic kidney disease awoke on 18 May with acute left-sided facial swelling.  He was seen in the emergency department and given Augmentin for parotitis.  CTs done at the time in the ER which were noncontrasted did not show evidence of parotid gland infection and were fairly unremarkable.  He returned on the 22nd due to lack of improvement and when then had clindamycin added to his Augmentin.  Is also referred to ear nose and throat.  He woke day  prior to admission the hospital with fever of 102.6.  He was seen in the ER and started on vancomycin and Zosyn and transferred to [facility].  His fevers have defervesced on vancomycin and Zosyn and per Dr. Avon Gully the swelling has gone down in his face.  When I came to talk to him today and also talked to his daughter who called him on his cell phone they seem to think that the swelling in the face had not gone down significantly.  We will get an MRI of the neck to further clarify if this is indeed parotitis certainly the fever leukocytosis and improvement described Dr. Avon Gully would fit with parotitis the location is a bit superior than I typically seen with parotid gland infection and currently is not as tender although it is several weeks into his diagnosis.  I spent greater than 80 minutes with the patient including greater than 50% of time in face to face counsel of the patient in reviewing his records his pertinent labs and microbiological data and in coordination of his care.     Review of Systems: Review of Systems  Unable to perform ROS: Dementia    Past Medical History:  Diagnosis Date  . Alzheimer disease ([place])   . CHF (congestive heart failure) ([place])   . Chronic kidney disease    mild insuffiency  . Coronary artery disease  LHC 4/10: Mid LAD 40-50%, then 70%, OM1 20-30%, EF 65%. Mid LAD FFR 0.89 (not hemodynamically significant). Medical therapy was continued.  . Hyperlipidemia   . LBBB (left bundle branch block) 06/2018  . Obstructive sleep apnea     Social History   Tobacco Use  . Smoking status: Former Smoker    Years: 40.00    Types: Pipe    Quit date: 02/28/1990    Years since quitting: 30.4  . Smokeless tobacco: Never Used  . Tobacco comment: smoked pipe only   Vaping Use  . Vaping Use: Never used  Substance Use Topics  . Alcohol use: No    Alcohol/week: 0.0 standard drinks    Comment: rarely wine  . Drug use: No    Family History   Problem Relation Age of Onset  . Cancer Mother   . Hypertension Mother   . Other Father        old age  . Hypertension Father    Allergies  Allergen Reactions  . Darifenacin     Other reaction(s): Mental Status Changes (intolerance) PT STATES IT MAKES HIM "BONKERS" AND VERY TIRED  . Enablex [Darifenacin Hydrobromide Er]     PT STATES IT MAKES HIM "BONKERS" AND VERY TIRED  . Ceftriaxone     Unknown reaction  . Atorvastatin     Muscle aches/weakness  . Ezetimibe     Other reaction(s): Other (See Comments) Muscle aches/weakness  . Levaquin [Levofloxacin] Diarrhea    Caused diarheer a   . Other     Intolerance to zetia and lipitor  . Oxybutynin Other (See Comments)    Memory impairment, temperament changes  . Sulfamethoxazole     Other reaction(s): Other (See Comments) Childhood allergy  . Sulfasalazine     Other reaction(s): Other (See Comments) Unknown per daughter  . Sulfonamide Derivatives     OBJECTIVE: Blood pressure 131/67, pulse 86, temperature 100 F (37.8 C), temperature source Oral, resp. rate 20, height 5\' 10"  (1.778 m), weight 92.5 kg, SpO2 96 %.  Physical Exam Constitutional:      Appearance: He is well-developed.  HENT:     Head: Normocephalic and atraumatic.   Eyes:     Extraocular Movements: Extraocular movements intact.     Conjunctiva/sclera: Conjunctivae normal.  Cardiovascular:     Rate and Rhythm: Normal rate and regular rhythm.     Heart sounds: No murmur heard. No friction rub. No gallop.   Pulmonary:     Effort: Pulmonary effort is normal. No respiratory distress.     Breath sounds: No wheezing.  Abdominal:     General: There is no distension.     Palpations: Abdomen is soft.  Musculoskeletal:        General: No tenderness. Normal range of motion.     Cervical back: Normal range of motion and neck supple.  Skin:    General: Skin is warm and dry.     Coloration: Skin is not pale.     Findings: No erythema or rash.  Neurological:      General: No focal deficit present.     Mental Status: He is alert and oriented to person, place, and time.  Psychiatric:        Mood and Affect: Mood normal.        Behavior: Behavior normal.     Lab Results Lab Results  Component Value Date   WBC 7.1 07/30/2020   HGB 11.9 (L) 07/30/2020   HCT 35.6 (L) 07/30/2020  MCV 92.5 07/30/2020   PLT 178 07/30/2020    Lab Results  Component Value Date   CREATININE 1.04 07/30/2020   BUN 20 07/30/2020   NA 132 (L) 07/30/2020   K 3.6 07/30/2020   CL 101 07/30/2020   CO2 21 (L) 07/30/2020    Lab Results  Component Value Date   ALT 23 07/26/2020   AST 17 07/26/2020   ALKPHOS 67 07/26/2020   BILITOT 0.9 07/26/2020     Microbiology: Recent Results (from the past 240 hour(s))  Blood Culture (routine x 2)     Status: None (Preliminary result)   Collection Time: 07/26/20  8:52 PM   Specimen: BLOOD  Result Value Ref Range Status   Specimen Description   Final    BLOOD LEFT ANTECUBITAL Performed at [facility], [address], [place], [place] [address]    Special Requests   Final    BOTTLES DRAWN AEROBIC AND ANAEROBIC Blood Culture adequate volume Performed at [facility], [address], [place], [place] [address]    Culture   Final    NO GROWTH 3 DAYS Performed at [facility], [place] [address] [place], [place] [address]    Report Status PENDING  Incomplete  Urine culture     Status: Abnormal   Collection Time: 07/26/20  8:52 PM   Specimen: Urine, Catheterized  Result Value Ref Range Status   Specimen Description   Final    URINE, CATHETERIZED Performed at [facility], [address] [place], [place] [address]    Special Requests   Final    NONE Performed at [facility], [address], [place], [place] [address]    Culture (A)  Final    <10,000 COLONIES/mL INSIGNIFICANT GROWTH Performed at [facility]  [address] [place], [place] [address]    Report Status 07/28/2020 FINAL  Final  Blood Culture (routine x 2)     Status: None (Preliminary result)   Collection Time: 07/26/20  8:57 PM   Specimen: BLOOD  Result Value Ref Range Status   Specimen Description   Final    BLOOD RIGHT HAND Performed at [facility], [address] [place], [place] [address]    Special Requests   Final    BOTTLES DRAWN AEROBIC AND ANAEROBIC Blood Culture adequate volume Performed at [facility], [address] [place], [place] [address]    Culture   Final    NO GROWTH 3 DAYS Performed at [facility], [place] [address], [place],  [address]    Report Status PENDING  Incomplete  Resp Panel by RT-PCR (Flu A&B, Covid) Nasopharyngeal Swab     Status: None   Collection Time: 07/26/20  9:05 PM   Specimen: Nasopharyngeal Swab; Nasopharyngeal(NP) swabs in vial transport medium  Result Value Ref Range Status   SARS Coronavirus 2 by RT PCR NEGATIVE NEGATIVE Final    Comment: (NOTE) SARS-CoV-2 target nucleic acids are NOT DETECTED.  The SARS-CoV-2 RNA is generally detectable in upper respiratory specimens during the acute phase of infection. The lowest concentration of SARS-CoV-2 viral copies this assay can detect is 138 copies/mL. A negative result does not preclude SARS-Cov-2 infection and should not be used as the sole basis for treatment or other patient management decisions. A negative result may occur with  improper specimen collection/handling, submission of specimen other than nasopharyngeal swab, presence of viral mutation(s) within the areas targeted by this assay, and inadequate  number of viral copies(<138 copies/mL). A negative result must be combined with clinical observations, patient history, and epidemiological information. The expected result is Negative.  Fact Sheet for Patients:  EntrepreneurPulse.com.au  Fact Sheet for Healthcare  Providers:  IncredibleEmployment.be  This test is no t yet approved or cleared by the [place] FDA and  has been authorized for detection and/or diagnosis of SARS-CoV-2 by FDA under an Emergency Use Authorization (EUA). This EUA will remain  in effect (meaning this test can be used) for the duration of the COVID-19 declaration under Section 564(b)(1) of the Act, 21 U.S.C.section 360bbb-3(b)(1), unless the authorization is terminated  or revoked sooner.       Influenza A by PCR NEGATIVE NEGATIVE Final   Influenza B by PCR NEGATIVE NEGATIVE Final    Comment: (NOTE) The Xpert Xpress SARS-CoV-2/FLU/RSV plus assay is intended as an aid in the diagnosis of influenza from Nasopharyngeal swab specimens and should not be used as a sole basis for treatment. Nasal washings and aspirates are unacceptable for Xpert Xpress SARS-CoV-2/FLU/RSV testing.  Fact Sheet for Patients: EntrepreneurPulse.com.au  Fact Sheet for Healthcare Providers: IncredibleEmployment.be  This test is not yet approved or cleared by the [place] FDA and has been authorized for detection and/or diagnosis of SARS-CoV-2 by FDA under an Emergency Use Authorization (EUA). This EUA will remain in effect (meaning this test can be used) for the duration of the COVID-19 declaration under Section 564(b)(1) of the Act, 21 U.S.C. section 360bbb-3(b)(1), unless the authorization is terminated or revoked.  Performed at [facility], [address] [place], [place] [address]     Alcide Evener, [place] [facility] [phone] pager  07/30/2020, 11:21 AM

## 2020-07-30 NOTE — Progress Notes (Signed)
PROGRESS NOTE    [name] [name]  JME:[id number] DOB: 04-14-1940 DOA: 07/26/2020 PCP: Chesley Noon, MD   Brief Narrative:  80 year old male with medical history of OSA, hyperlipidemia, CAD, chronic diastolic CHF, dementia with recent diagnosis of parotitis presented with outpatient treatment failure.  He was treated with Augmentin as outpatient for left-sided facial swelling.  He was also referred to ENT for appointment on 08/05/2020.  He did not improve so he came back to ED and was given clindamycin in addition to Augmentin but continued to have worsening pain and fever of 102.6.  He was started on vancomycin and Zosyn and transferred to [facility]:   Principal Problem:   Acute parotitis Active Problems:   Dyslipidemia   Stage 3a chronic kidney disease ([place])   Benign essential hypertension   Chronic diastolic CHF (congestive heart failure) (HCC)   Alzheimer's dementia (HCC)   Facial cellulitis  Parotitis with facial cellulitis, failure of outpatient antibiotics, POA -ID consulted, appreciate insight recommendations, MRI pending for further evaluation -continue current antibiotic regimen -Admitting provider discussed with ENT Dr. Isaias Cowman:  -Parotitis in the elderly is usually associated with dehydration often due to decreased p.o. intake and use of diuretics  -Needs significant oral hydration, push p.o. fluids  -Include lemon and all water, suck on lemon (or other sialagogues)  -Warm compresses every 2 hours  -Massage is important, every 2 hours  -Gland gets infected with pus around duct, massage with cupping fingers in front of ear and pulling towards chin  -If effective with strategies above patient may be better with 1 to 2 days  -If strategies are not aggressively pursued, patient can require prolonged hospitalization for this issue  Dementia, unspecified -No behavior disturbance; continue Aricept  Hypertension -Continue lisinopril; blood  pressure controlled  BPH -Continue Toviaz, mirabegron  Diabetes mellitus type 2 -Continue sliding scale insulin with NovoLog -CBG well controlled  CKD stage IIIa -Stable  Chronic diastolic CHF, not in acute exacerbation -Lasix on hold to prevent dehydration in setting of parotitis  CAD -Continue aspirin   DVT prophylaxis: Lovenox Code Status: Full Family Communication: None available  Status is: Inpatient  Dispo: The patient is from: Home lives with daughter/family              Anticipated d/c is to: To be determined              Anticipated d/c date is: 48 to 72 hours              Patient currently not medically stable for discharge  Consultants:   ENT consulted at admission for recommendations  ID  Procedures:   None  Antimicrobials:  Vancomycin, Zosyn  Subjective: No acute issues or events overnight, patient indicates he is feeling better, swelling is decreased but not yet resolved.  Otherwise denies nausea vomiting diarrhea constipation headache fevers or chills.  Objective: Vitals:   07/29/20 1633 07/29/20 1955 07/29/20 2256 07/30/20 0329  BP: (!) 110/59 (!) 112/58 138/82 (!) 160/98  Pulse: 71 81 81 78  Resp: 20 (!) 23 (!) 22 20  Temp: 97.9 F (36.6 C) 98.4 F (36.9 C) 98.5 F (36.9 C) 98.6 F (37 C)  TempSrc: Oral Oral Oral Axillary  SpO2: 98% 98% 99% 94%  Weight:      Height:        Intake/Output Summary (Last 24 hours) at 07/30/2020 0753 Last data filed at 07/30/2020 0700 Gross per 24 hour  Intake 3626.9  ml  Output 1725 ml  Net 1901.9 ml   Filed Weights   07/26/20 2038 07/27/20 0251 07/27/20 0557  Weight: 95.2 kg 95.2 kg 92.5 kg    Examination:  General:  Pleasantly resting in bed, No acute distress. HEENT: Bilateral edema of the bilateral parotid regions, left greater than right without overt pain upon examination.  Patient's voice is somewhat muffled and "hot potato" in nature Neck: Edematous at the submandibular area improving  anteriorly Lungs:  Clear to auscultate bilaterally without rhonchi, wheeze, or rales. Heart:  Regular rate and rhythm.  Without murmurs, rubs, or gallops. Abdomen:  Soft, nontender, nondistended.  Without guarding or rebound. Extremities: Without cyanosis, clubbing, edema, or obvious deformity. Vascular:  Dorsalis pedis and posterior tibial pulses palpable bilaterally. Skin:  Warm and dry, no erythema, no ulcerations.  Data Reviewed: I have personally reviewed following labs and imaging studies  CBC: Recent Labs  Lab 07/26/20 2052 07/28/20 0119 07/29/20 0157 07/30/20 0204  WBC 13.2* 12.8* 9.3 7.1  NEUTROABS 11.5*  --   --   --   HGB 14.4 13.8 13.2 11.9*  HCT 42.6 41.4 40.3 35.6*  MCV 92.6 94.3 93.9 92.5  PLT 220 155 163 865   Basic Metabolic Panel: Recent Labs  Lab 07/26/20 2052 07/28/20 0119 07/29/20 0157 07/30/20 0204  NA 135 134* 136 132*  K 4.2 4.1 3.8 3.6  CL 99 103 102 101  CO2 27 20* 25 21*  GLUCOSE 173* 144* 129* 138*  BUN 22 19 19 20   CREATININE 1.26* 1.30* 1.24 1.04  CALCIUM 10.4* 9.2 9.0 8.8*   GFR: Estimated Creatinine Clearance: 62.6 mL/min (by C-G formula based on SCr of 1.04 mg/dL). Liver Function Tests: Recent Labs  Lab 07/26/20 2052  AST 17  ALT 23  ALKPHOS 67  BILITOT 0.9  PROT 7.6  ALBUMIN 4.5   No results for input(s): LIPASE, AMYLASE in the last 168 hours. No results for input(s): AMMONIA in the last 168 hours. Coagulation Profile: Recent Labs  Lab 07/26/20 2052  INR 1.1   Cardiac Enzymes: No results for input(s): CKTOTAL, CKMB, CKMBINDEX, TROPONINI in the last 168 hours. BNP (last 3 results) Recent Labs    04/21/20 1510  PROBNP 100   HbA1C: No results for input(s): HGBA1C in the last 72 hours. CBG: Recent Labs  Lab 07/29/20 0618 07/29/20 1101 07/29/20 1635 07/29/20 2154 07/30/20 0632  GLUCAP 141* 189* 188* 176* 308*   Lipid Profile: No results for input(s): CHOL, HDL, LDLCALC, TRIG, CHOLHDL, LDLDIRECT in the last  72 hours. Thyroid Function Tests: No results for input(s): TSH, T4TOTAL, FREET4, T3FREE, THYROIDAB in the last 72 hours. Anemia Panel: No results for input(s): VITAMINB12, FOLATE, FERRITIN, TIBC, IRON, RETICCTPCT in the last 72 hours. Sepsis Labs: Recent Labs  Lab 07/26/20 2052 07/27/20 0639 07/27/20 0850  PROCALCITON  --   --  0.49  LATICACIDVEN 1.6 1.6  --     Recent Results (from the past 240 hour(s))  Blood Culture (routine x 2)     Status: None (Preliminary result)   Collection Time: 07/26/20  8:52 PM   Specimen: BLOOD  Result Value Ref Range Status   Specimen Description   Final    BLOOD LEFT ANTECUBITAL Performed at [facility], [address], [place], [place] [address]    Special Requests   Final    BOTTLES DRAWN AEROBIC AND ANAEROBIC Blood Culture adequate volume Performed at [facility], [address], [place], [place] [address]  Culture   Final    NO GROWTH 3 DAYS Performed at [facility], [place] [address] [place] [address] [place], [place] [address]    Report Status PENDING  Incomplete  Urine culture     Status: Abnormal   Collection Time: 07/26/20  8:52 PM   Specimen: Urine, Catheterized  Result Value Ref Range Status   Specimen Description   Final    URINE, CATHETERIZED Performed at [facility], [address] [place], [place] [address]    Special Requests   Final    NONE Performed at [facility], [address], [place], [place] [address]    Culture (A)  Final    <10,000 COLONIES/mL INSIGNIFICANT GROWTH Performed at [facility] [address] [place], [place] [address]    Report Status 07/28/2020 FINAL  Final  Blood Culture (routine x 2)     Status: None (Preliminary result)   Collection Time: 07/26/20  8:57 PM   Specimen: BLOOD  Result Value Ref Range Status   Specimen Description   Final    BLOOD RIGHT HAND Performed at [facility], [address] [place], [place] [address]    Special Requests   Final    BOTTLES DRAWN AEROBIC AND ANAEROBIC Blood Culture adequate volume Performed at [facility], [address] [place], [place] [address]    Culture   Final    NO GROWTH 3 DAYS Performed at [facility], [place] [address], [place], [place] [address]    Report Status PENDING  Incomplete  Resp Panel by RT-PCR (Flu A&B, Covid) Nasopharyngeal Swab     Status: None   Collection Time: 07/26/20  9:05 PM   Specimen: Nasopharyngeal Swab; Nasopharyngeal(NP) swabs in vial transport medium  Result Value Ref Range Status   SARS Coronavirus 2 by RT PCR NEGATIVE NEGATIVE Final    Comment: (NOTE) SARS-CoV-2 target nucleic acids are NOT DETECTED.  The SARS-CoV-2 RNA is generally detectable in upper respiratory specimens during the acute phase of infection. The lowest concentration of SARS-CoV-2 viral copies this assay can detect is 138 copies/mL. A negative result does not preclude SARS-Cov-2 infection and should not be used as the sole basis for treatment or other patient management decisions. A negative result may occur with  improper specimen collection/handling, submission of specimen other than nasopharyngeal swab, presence of viral mutation(s) within the areas targeted by this assay, and inadequate number of viral copies(<138 copies/mL). A negative result must be combined with clinical observations, patient history, and epidemiological information. The expected result is Negative.  Fact Sheet for Patients:  EntrepreneurPulse.com.au  Fact Sheet for Healthcare Providers:  IncredibleEmployment.be  This test is no t yet approved or cleared by the [place] FDA and  has been authorized for detection and/or diagnosis of SARS-CoV-2 by FDA under an Emergency Use Authorization (EUA). This EUA will remain  in effect (meaning this test can be used) for the duration of  the COVID-19 declaration under Section 564(b)(1) of the Act, 21 U.S.C.section 360bbb-3(b)(1), unless the authorization is terminated  or revoked sooner.       Influenza A by PCR NEGATIVE NEGATIVE Final   Influenza B by PCR NEGATIVE NEGATIVE Final    Comment: (NOTE) The Xpert Xpress SARS-CoV-2/FLU/RSV plus assay is intended as an aid in the diagnosis of influenza from Nasopharyngeal swab specimens and should not be used as a sole basis for treatment. Nasal washings and aspirates are unacceptable for Xpert Xpress SARS-CoV-2/FLU/RSV testing.  Fact Sheet for Patients: EntrepreneurPulse.com.au  Fact Sheet for Healthcare Providers: IncredibleEmployment.be  This test is not yet approved or cleared by the [place] FDA and has been authorized for detection and/or diagnosis of SARS-CoV-2 by FDA under an Emergency Use Authorization (EUA). This EUA will remain in effect (meaning this test can be used) for the duration of the COVID-19 declaration under Section 564(b)(1) of the Act, 21 U.S.C. section 360bbb-3(b)(1), unless the authorization is terminated or revoked.  Performed at [facility], [address], [place], [place] [address]          Radiology Studies: No results found.      Scheduled Meds: . aspirin  81 mg Oral BID  . docusate sodium  100 mg Oral Daily  . donepezil  10 mg Oral QHS  . enoxaparin (LOVENOX) injection  40 mg Subcutaneous Q24H  . fesoterodine  8 mg Oral Daily  . insulin aspart  0-15 Units Subcutaneous TID WC  . lisinopril  20 mg Oral Daily  . mirabegron ER  25 mg Oral Daily  . rosuvastatin  5 mg Oral QODAY  . sodium chloride flush  3 mL Intravenous Q12H   Continuous Infusions: . lactated ringers 50 mL/hr at 07/30/20 0700  . piperacillin-tazobactam (ZOSYN)  IV 12.5 mL/hr at 07/30/20 0700  . vancomycin Stopped (07/29/20 2312)     LOS: 3 days   Time spent: 5min  Meredeth Furber C Bryden Darden,  DO [facility]  If 7PM-7AM, please contact night-coverage [email or web address]  07/30/2020, 7:53 AM

## 2020-07-30 NOTE — Progress Notes (Signed)
Pharmacy Antibiotic Note  [name] [name] is a 80 y.o. male admitted on 07/26/2020 with infection of parotid glands  Pharmacy has been consulted for Vancomycin dosing.   Day #4 Vancomycin.  Zosyn stopped today.  Peak level 20 mcg/ml ~3 hrs after Vanc 1gm IV q24h dose infused.  Trough level 7 mcg/ml.  AUC 318, below target.  Ke 0.0563, half-life 12.3 hrs  Calculations suggest increasing dose to 1500 mg IV q24h for AUC 477, but hesitant to increase dose by 50% with CKD3 and 80 yrs old. Now afebrile, WBC improved. Uncertain if facial swelling has improved.   Plan:  Increase Vancomycin to 1250 mg IV q24h.  Estimated AUC 398  Goal AUC 400-550.  Monitor renal function, culture data, clinical progress.  Recheck Vanc levels at steady state.   Height: 5\' 10"  (177.8 cm) Weight: 92.5 kg (203 lb 14.8 oz) IBW/kg (Calculated) : 73  Temp (24hrs), Avg:98.8 F (37.1 C), Min:98.3 F (36.8 C), Max:100 F (37.8 C)  Recent Labs  Lab 07/26/20 2052 07/27/20 0639 07/28/20 0119 07/29/20 0157 07/30/20 0204 07/30/20 2042  WBC 13.2*  --  12.8* 9.3 7.1  --   CREATININE 1.26*  --  1.30* 1.24 1.04  --   LATICACIDVEN 1.6 1.6  --   --   --   --   VANCOTROUGH  --   --   --   --   --  7*  VANCOPEAK  --   --   --   --  20*  --     Estimated Creatinine Clearance: 62.6 mL/min (by C-G formula based on SCr of 1.04 mg/dL).    Allergies  Allergen Reactions  . Darifenacin     Other reaction(s): Mental Status Changes (intolerance) PT STATES IT MAKES HIM "BONKERS" AND VERY TIRED  . Enablex [Darifenacin Hydrobromide Er]     PT STATES IT MAKES HIM "BONKERS" AND VERY TIRED  . Ceftriaxone     Unknown reaction  . Atorvastatin     Muscle aches/weakness  . Ezetimibe     Other reaction(s): Other (See Comments) Muscle aches/weakness  . Levaquin [Levofloxacin] Diarrhea    Caused diarheer a   . Other     Intolerance to zetia and lipitor  . Oxybutynin Other (See Comments)    Memory impairment, temperament  changes  . Sulfamethoxazole     Other reaction(s): Other (See Comments) Childhood allergy  . Sulfasalazine     Other reaction(s): Other (See Comments) Unknown per daughter  . Sulfonamide Derivatives     Antimicrobials this admission:  Vancomycin 5/29 >>  Zosyn 5/29 >>6/2  Dose adjustments this admission:  6/2: VP 20 mcg/ml ~3 hrs after dose  6/2: 7 mcg/ml  - on 1gm IV q24h > incr to 1250 mg IV q24h  Microbiology results:  5/29 blood: no growth x 3 days to date  5/29 urine: insignificant growth  5/29 COVID and flu: negative  Thank you for allowing pharmacy to be a part of this patient's care.  Arty Baumgartner, [place] 07/30/2020 10:27 PM

## 2020-07-30 NOTE — TOC Initial Note (Signed)
Transition of Care [facility]) - Initial/Assessment Note    Patient Details  Name: [name] [name] MRN: [id number] Date of Birth: 07/26/1940  [facility] CM/SW Contact:    Verdell Carmine, RN Phone Number: 07/30/2020, 3:24 PM  Clinical Narrative:                 80 year old male inwith parotiditis. On IV antibiotics. Vancomyacin Will receive MRI to make sure of the location of abbess. . . Will see if he will convert to PO abx prior to going home or if he will need IV abx at home with PICC> Pam from [facility] given the heads up conccerning this patient.s potential for IV antibiotics. CM will follow for needs  Expected Discharge Plan: [place] Barriers to Discharge: Continued Medical Work up   Patient Goals and CMS Choice        Expected Discharge Plan and Services Expected Discharge Plan: [place]   Discharge Planning Services: CM Consult   Living arrangements for the past 2 months: Single Family Home                                      Prior Living Arrangements/Services Living arrangements for the past 2 months: Single Family Home Lives with:: Self Patient language and need for interpreter reviewed:: Yes        Need for Family Participation in Patient Care: Yes (Comment) Care giver support system in place?: Yes (comment)   Criminal Activity/Legal Involvement Pertinent to Current Situation/Hospitalization: No - Comment as needed  Activities of Daily Living Home Assistive Devices/Equipment: Cane (specify quad or straight) ADL Screening (condition at time of admission) Patient's cognitive ability adequate to safely complete daily activities?: No Is the patient deaf or have difficulty hearing?: Yes Does the patient have difficulty seeing, even when wearing glasses/contacts?: No Does the patient have difficulty concentrating, remembering, or making decisions?: Yes Patient able to express need for assistance with ADLs?:  No Does the patient have difficulty dressing or bathing?: Yes Independently performs ADLs?: No Does the patient have difficulty walking or climbing stairs?: Yes Weakness of Legs: Both Weakness of Arms/Hands: Both  Permission Sought/Granted                  Emotional Assessment       Orientation: : Oriented to Self,Oriented to Place,Oriented to  Time,Oriented to Situation Alcohol / Substance Use: Not Applicable Psych Involvement: No (comment)  Admission diagnosis:  Facial cellulitis [L03.211] Acute parotitis [K11.21] Patient Active Problem List   Diagnosis Date Noted  . Acute parotitis 07/27/2020  . Facial cellulitis 07/26/2020  . 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  . Primary osteoarthritis of left hip 09/06/2018  . Alzheimer's dementia ([place]) 12/07/2016  . 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  . 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  . Venous stasis dermatitis of left lower extremity 04/07/2016  . Bilateral impacted cerumen 01/18/2016  . 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  . Unsteadiness 01/18/2016  . 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  . Cellulitis of left lower leg   . Left leg swelling   . Cellulitis 08/23/2014  . Chronic diastolic CHF (congestive heart failure) ([place]) 04/29/2014  . DOE (dyspnea on exertion) 11/12/2011  . Stage 3a chronic kidney disease ([place])   . Thrombocytopenia, unspecified ([place]) 07/29/2010  . Vitamin D deficiency 05/19/2010  .  Depression, major, recurrent, mild ([place]) 05/19/2010  . Urinary urgency 05/19/2010  . 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 Benign essential hypertension 12/23/2009  . Actinic keratosis 12/23/2009  . Seborrheic keratosis 03/18/2009  . Benign neoplasm 03/18/2009  . Dyslipidemia 01/18/2008  . Obstructive sleep apnea 01/18/2008  . Coronary atherosclerosis 01/18/2008  . Atherosclerotic heart disease of native coronary artery without angina pectoris  01/18/2008  . 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 Unspecified protein-calorie malnutrition ([place]) 09/27/2006  . Gastro-esophageal reflux disease with esophagitis 09/15/2006  . 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 CAD (coronary artery disease), native coronary artery 05/11/2005   PCP:  Chesley Noon, MD Pharmacy:   [facility] [phone] - [place] - [address] [place] [address] Phone: [phone] Fax: [phone]     Social Determinants of Health (SDOH) Interventions    Readmission Risk Interventions No flowsheet data found.

## 2020-07-30 NOTE — Progress Notes (Signed)
Pt has home CPAP.  

## 2020-07-30 NOTE — Progress Notes (Signed)
Mobility Specialist - Progress Note   07/30/20 1044  Mobility  Activity Ambulated in hall  Level of Assistance Minimal assist, patient does 75% or more  Assistive Device Front wheel walker  Distance Ambulated (ft) 200 ft  Mobility Ambulated with assistance in hallway  Mobility Response Tolerated fair  Mobility performed by Mobility specialist  $Mobility charge 1 Mobility   Pt found standing by the recliner, straddling the foot rest, with his L sock and condom catheter off. Pt was asked why he was standing and how his sock/catheter came off, pt stated he was unsure. Pt sat and stood x3 w/ min assist in the process of being cleaned and then ambulated in the hallway. HR in 90s throughout and SpO2 remained at 97%. Pt to recliner after walk, physician in room. RN and NT notified of catheter.   Pricilla Handler Mobility Specialist Mobility Specialist Phone: [phone]

## 2020-07-30 NOTE — Progress Notes (Signed)
Inpatient Diabetes Program Recommendations  AACE/ADA: New Consensus Statement on Inpatient Glycemic Control (2015)  Target Ranges:  Prepandial:   less than 140 mg/dL      Peak postprandial:   less than 180 mg/dL (1-2 hours)      Critically ill patients:  140 - 180 mg/dL   Lab Results  Component Value Date   GLUCAP 308 (H) 07/30/2020    Review of Glycemic Control Results for Robinette, Ishaaq E "DAN" (MRN [id number]) as of 07/30/2020 10:52  Ref. Range 07/29/2020 16:35 07/29/2020 21:54 07/30/2020 06:32  Glucose-Capillary Latest Ref Range: 70 - 99 mg/dL 188 (H) 176 (H) 308 (H)   Diabetes history: No DM hx per Care everywhere Outpatient Diabetes medications: none Current orders for Inpatient glycemic control: Novolog 0-15 units TID  Inpatient Diabetes Program Recommendations:    Consider adding A1C? No previous history of DM noted per Care Everywhere. AM CBG 308 mg/dL, question if outlier vs eating meal.  Thanks, Bronson Curb, MSN, RNC-OB Diabetes Coordinator [phone] (8a-5p)

## 2020-07-30 NOTE — Progress Notes (Signed)
Mobility Specialist - Progress Note   07/30/20 1345  Mobility  Activity Ambulated in hall  Level of Assistance Minimal assist, patient does 75% or more  Assistive Device Front wheel walker  Distance Ambulated (ft) 200 ft  Mobility Ambulated with assistance in hallway  Mobility Response Tolerated well  Mobility performed by Mobility specialist  $Mobility charge 1 Mobility   Pt min assist to stand from chair. Distance limited by fatigued. Pt in bed after walk, bed alarm on and pt oriented to call bell. VSS throughout.  Pricilla Handler Mobility Specialist Mobility Specialist Phone: [phone]

## 2020-07-31 ENCOUNTER — Other Ambulatory Visit (HOSPITAL_COMMUNITY): Payer: Self-pay

## 2020-07-31 DIAGNOSIS — I5032 Chronic diastolic (congestive) heart failure: Secondary | ICD-10-CM | POA: Diagnosis not present

## 2020-07-31 DIAGNOSIS — G308 Other Alzheimer's disease: Secondary | ICD-10-CM | POA: Diagnosis not present

## 2020-07-31 DIAGNOSIS — E785 Hyperlipidemia, unspecified: Secondary | ICD-10-CM | POA: Diagnosis not present

## 2020-07-31 DIAGNOSIS — K1121 Acute sialoadenitis: Secondary | ICD-10-CM | POA: Diagnosis not present

## 2020-07-31 LAB — BASIC METABOLIC PANEL
Anion gap: 4 — ABNORMAL LOW (ref 5–15)
BUN: 17 mg/dL (ref 8–23)
CO2: 24 mmol/L (ref 22–32)
Calcium: 8.8 mg/dL — ABNORMAL LOW (ref 8.9–10.3)
Chloride: 108 mmol/L (ref 98–111)
Creatinine, Ser: 0.98 mg/dL (ref 0.61–1.24)
GFR, Estimated: >60 mL/min (ref >60)
Glucose, Bld: 127 mg/dL — ABNORMAL HIGH (ref 70–99)
Potassium: 3.8 mmol/L (ref 3.5–5.1)
Sodium: 136 mmol/L (ref 135–145)

## 2020-07-31 LAB — CBC
HCT: 32.5 % — ABNORMAL LOW (ref 39.0–52.0)
Hemoglobin: 11 g/dL — ABNORMAL LOW (ref 13.0–17.0)
MCH: 31.6 pg (ref 26.0–34.0)
MCHC: 33.8 g/dL (ref 30.0–36.0)
MCV: 93.4 fL (ref 80.0–100.0)
Platelets: 239 10*3/uL (ref 150–400)
RBC: 3.48 MIL/uL — ABNORMAL LOW (ref 4.22–5.81)
RDW: 13.3 % (ref 11.5–15.5)
WBC: 7.2 10*3/uL (ref 4.0–10.5)
nRBC: 0 % (ref 0.0–0.2)

## 2020-07-31 LAB — GLUCOSE, CAPILLARY
Glucose-Capillary: 122 mg/dL — ABNORMAL HIGH (ref 70–99)
Glucose-Capillary: 193 mg/dL — ABNORMAL HIGH (ref 70–99)

## 2020-07-31 MED ORDER — FISH OIL 1000 MG PO CAPS
1.0000 | ORAL_CAPSULE | Freq: Two times a day (BID) | ORAL | 0 refills | Status: DC
  Filled 2020-07-31: qty 90, 45d supply, fill #0

## 2020-07-31 MED ORDER — LINEZOLID 600 MG PO TABS
600.0000 mg | ORAL_TABLET | Freq: Two times a day (BID) | ORAL | 0 refills | Status: AC
  Filled 2020-07-31: qty 14, 7d supply, fill #0

## 2020-07-31 MED ORDER — LINEZOLID 600 MG PO TABS
600.0000 mg | ORAL_TABLET | Freq: Two times a day (BID) | ORAL | Status: DC
  Administered 2020-07-31: 600 mg via ORAL
  Filled 2020-07-31 (×2): qty 1

## 2020-07-31 NOTE — Progress Notes (Signed)
Discharge instructions (including medications) discussed with and copy provided to patient/caregiver 

## 2020-07-31 NOTE — Progress Notes (Signed)
Mobility Specialist - Progress Note   07/31/20 1054  Mobility  Activity Ambulated in hall  Level of Assistance Minimal assist, patient does 75% or more  Assistive Device Front wheel walker  Distance Ambulated (ft) 370 ft  Mobility Ambulated with assistance in hallway  Mobility Response Tolerated well  Mobility performed by Mobility specialist  $Mobility charge 1 Mobility   Pt min assist to stand from recliner. He was asx throughout ambulation. Pt to bed after walk, bed alarm on and call bell at side. VSS throughout.   Pricilla Handler Mobility Specialist Mobility Specialist Phone: [phone]

## 2020-07-31 NOTE — Progress Notes (Signed)
Mobility Specialist - Progress Note   07/31/20 1346  Mobility  Activity Ambulated in hall  Level of Assistance Minimal assist, patient does 75% or more  Assistive Device Front wheel walker  Distance Ambulated (ft) 370 ft  Mobility Ambulated with assistance in hallway  Mobility Response Tolerated well  Mobility performed by Mobility specialist  $Mobility charge 1 Mobility   Pt min assist to sit up on edge of bed and to stand. Asx throughout ambulation. Pt to recliner after walk, call bell at side.   Pricilla Handler Mobility Specialist Mobility Specialist Phone: [phone]

## 2020-07-31 NOTE — Discharge Summary (Signed)
Physician Discharge Summary  [name] [name]:[id number] DOB: 24-Nov-1940 DOA: 07/26/2020  PCP: Chesley Noon, MD  Admit date: 07/26/2020 Discharge date: 07/31/2020  Admitted From: Home Disposition: Home  Recommendations for Outpatient Follow-up:  1. Follow up with PCP in 1-2 weeks 2. Please obtain BMP/CBC in one week 3. Please follow up with ENT next week as scheduled:  Home Health: None Equipment/Devices: None  Discharge Condition: Stable CODE STATUS: Full Diet recommendation: Low-salt low-fat diet as tolerated  Brief/Interim Summary: 80 year old male with medical history of OSA, hyperlipidemia, CAD, chronic diastolic CHF, dementia with recent diagnosis of parotitis presented with outpatient treatment failure. He was treated with Augmentin as outpatient for left-sided facial swelling. He was also referred to ENT for appointment on 08/05/2020. He did not improve so he came back to ED and was given clindamycin in addition to Augmentin but continued to have worsening pain and fever of 102.6. He was started on vancomycin and Zosyn and transferred to [facility].  Patient admitted as above with left parotid infection failure of outpatient antibiotics.  Patient improved quite drastically over the past 48 hours, ID consulted to further evaluate, MRI was obtained which was unfortunately a poor film due to motion degradation but given improvement in symptoms, inflammation, swelling, erythema and fever it appears his acute infection is resolving.  Lengthy discussion with family at bedside today about his ongoing need for use of sour food/candy to continue to drain parotid gland per ENT recommendations.  He has an ENT appointment follow-up on the eighth next week and he should keep this appointment.  Incidentally noted on MRI of the neck patient has a thyroid nodule measuring 1.6 cm -radiology recommending ultrasound, will have PCP schedule this in the next few weeks.  We will transition  patient to p.o. linezolid per ID recommendations -he is otherwise stable and agreeable for discharge.  Discharge Diagnoses:  Principal Problem:   Acute parotitis Active Problems:   Dyslipidemia   Stage 3a chronic kidney disease (HCC)   Benign essential hypertension   Chronic diastolic CHF (congestive heart failure) (HCC)   Alzheimer's dementia (HCC)   Facial cellulitis    Discharge Instructions  Discharge Instructions    Call MD for:  difficulty breathing, headache or visual disturbances   Complete by: As directed    Call MD for:  redness, tenderness, or signs of infection (pain, swelling, redness, odor or green/yellow discharge around incision site)   Complete by: As directed    Diet - low sodium heart healthy   Complete by: As directed    Increase activity slowly   Complete by: As directed      Allergies as of 07/31/2020      Reactions   Darifenacin    Other reaction(s): Mental Status Changes (intolerance) PT STATES IT MAKES HIM "BONKERS" AND VERY TIRED   Enablex [darifenacin Hydrobromide Er]    PT STATES IT MAKES HIM "BONKERS" AND VERY TIRED   Ceftriaxone    Unknown reaction   Atorvastatin    Muscle aches/weakness   Ezetimibe    Other reaction(s): Other (See Comments) Muscle aches/weakness   Levaquin [levofloxacin] Diarrhea   Caused diarheer a   Other    Intolerance to zetia and lipitor   Oxybutynin Other (See Comments)   Memory impairment, temperament changes   Sulfamethoxazole    Other reaction(s): Other (See Comments) Childhood allergy   Sulfasalazine    Other reaction(s): Other (See Comments) Unknown per daughter   Sulfonamide Derivatives  Medication List    STOP taking these medications   metroNIDAZOLE 1 % gel Commonly known as: METROGEL   naftifine 1 % cream Commonly known as: NAFTIN   triamcinolone ointment 0.1 % Commonly known as: KENALOG     TAKE these medications   aspirin 81 MG chewable tablet Chew 1 tablet (81 mg total) by  mouth 2 (two) times daily.   calcium-vitamin D 500-200 MG-UNIT tablet Commonly known as: OSCAL WITH D Take 1 tablet by mouth 2 (two) times daily.   docusate sodium 100 MG capsule Commonly known as: COLACE Take 100 mg by mouth daily.   donepezil 10 MG tablet Commonly known as: ARICEPT Take 10 mg by mouth at bedtime.   Fish Oil 1000 MG Caps Take 1 capsule (1,000 mg total) by mouth 2 (two) times daily. What changed: how much to take   fluticasone 50 MCG/ACT nasal spray Commonly known as: FLONASE Place 2 sprays into both nostrils daily as needed for allergies.   furosemide 20 MG tablet Commonly known as: LASIX Take 20 mg by mouth every other day.   linezolid 600 MG tablet Commonly known as: ZYVOX Take 1 tablet (600 mg total) by mouth 2 (two) times daily for 7 days.   lisinopril 20 MG tablet Commonly known as: ZESTRIL Take 20 mg by mouth daily.   METAMUCIL PO Take 5 mLs by mouth daily. Mix with water or juice   mirabegron ER 25 MG Tb24 tablet Commonly known as: MYRBETRIQ Take 25 mg by mouth daily.   multivitamin tablet Take 1 tablet by mouth daily.   rosuvastatin 10 MG tablet Commonly known as: CRESTOR TAKE 1/2 TABLET BY MOUTH EVERY OTHER DAY   Toviaz 8 MG Tb24 tablet Generic drug: fesoterodine Take 8 mg by mouth daily.   vitamin C 1000 MG tablet Take 1,000 mg by mouth daily.       Allergies  Allergen Reactions  . Darifenacin     Other reaction(s): Mental Status Changes (intolerance) PT STATES IT MAKES HIM "BONKERS" AND VERY TIRED  . Enablex [Darifenacin Hydrobromide Er]     PT STATES IT MAKES HIM "BONKERS" AND VERY TIRED  . Ceftriaxone     Unknown reaction  . Atorvastatin     Muscle aches/weakness  . Ezetimibe     Other reaction(s): Other (See Comments) Muscle aches/weakness  . Levaquin [Levofloxacin] Diarrhea    Caused diarheer a   . Other     Intolerance to zetia and lipitor  . Oxybutynin Other (See Comments)    Memory impairment,  temperament changes  . Sulfamethoxazole     Other reaction(s): Other (See Comments) Childhood allergy  . Sulfasalazine     Other reaction(s): Other (See Comments) Unknown per daughter  . Sulfonamide Derivatives     Consultations:  Infectious disease   Procedures/Studies: CT Soft Tissue Neck Wo Contrast  Result Date: 07/19/2020 CLINICAL DATA:  Left parotid mass.  Preauricular swelling. EXAM: CT NECK WITHOUT CONTRAST TECHNIQUE: Multidetector CT imaging of the neck was performed following the standard protocol without intravenous contrast. COMPARISON:  None. FINDINGS: Pharynx and larynx: Normal. No mass or swelling. Salivary glands: No inflammation, mass, or stone. Thyroid: Large coarse calcification right lobe of thyroid measuring 17 mm in diameter. Left lobe negative. Lymph nodes: No enlarged lymph nodes in the neck. Vascular: Limited vascular evaluation without intravenous contrast. Limited intracranial: Negative Visualized orbits: Bilateral cataract extraction Mastoids and visualized paranasal sinuses: Paranasal sinuses clear. Mastoid clear. Skeleton: Cervical spondylosis.  No acute skeletal abnormality.  Upper chest: Lung apices clear bilaterally. Other: No edema in the left pre-auricular region. No mass or adenopathy. IMPRESSION: Negative CT neck without contrast. No mass or edema in the left pre-auricular region. Electronically Signed   By: Franchot Gallo M.D.   On: 07/19/2020 15:44   MR NECK SOFT TISSUE ONLY W WO CONTRAST  Result Date: 07/30/2020 CLINICAL DATA:  Parotid region mass. Additional history obtained from [place] left parotiditis, rule out mass. EXAM: MRI OF THE NECK WITH CONTRAST TECHNIQUE: Multiplanar, multisequence MR imaging was performed following the administration of intravenous contrast. CONTRAST:  77mL GADAVIST GADOBUTROL 1 MMOL/ML IV SOLN COMPARISON:  Maxillofacial CT 07/26/2020. FINDINGS: Intermittently motion degraded examination, limiting  evaluation. Pharynx and larynx: Within limitations of motion degradation, no definite swelling or discrete mass is identified within the oral cavity, pharynx or larynx. Salivary glands: There is asymmetric enhancement of the left parotid gland, and findings may reflect parotiditis (for instance as seen on series 8, image 20). Within the limitations of motion degradation, no definite discrete left parotid mass is identified. The right parotid and bilateral submandibular glands are unremarkable. Thyroid: 1.6 cm right thyroid lobe nodule (series 8, image 49). Lymph nodes: No pathologically enlarged cervical chain lymph nodes are identified. Vascular: Flow voids maintained within the major vascular structures of the neck. Limited intracranial: No evidence of acute intracranial abnormality within the field of view. Generalized atrophy and chronic small vessel ischemic disease at the imaged levels. Visualized orbits: Incompletely imaged. No mass or acute finding within the imaged orbits. Mastoids and visualized paranasal sinuses: Mild bilateral ethmoid and right maxillary sinus mucosal thickening. No significant mastoid effusion. Skeleton: Cervical spondylosis with multilevel disc space narrowing, disc bulges, endplate spurring, uncovertebral hypertrophy and facet arthrosis. Disc space narrowing is moderate/advanced at C5-C6 and C6-C7. Mild degenerative endplate edema and enhancement anteriorly and C6-C7. Upper chest: No signal abnormality within the imaged lung apices. IMPRESSION: Multiple sequences are significantly motion degraded, limiting evaluation. There is asymmetric enhancement of the left parotid gland, which may reflect parotiditis. No definite discrete mass is identified within the left parotid gland, although motion degradation limits evaluation. If the palpable abnormality does not resolve with the current therapy, a follow-up contrast-enhanced neck CT should be considered. Mild bilateral ethmoid and right  maxillary sinus mucosal thickening. 1.6 cm right thyroid lobe nodule. A nonemergent thyroid ultrasound is recommended for further evaluation. Cervical spondylosis, as described. Electronically Signed   By: Kellie Simmering DO   On: 07/30/2020 18:41   DG Chest Port 1 View  Result Date: 07/26/2020 CLINICAL DATA:  80 year old male with concern for sepsis. EXAM: PORTABLE CHEST 1 VIEW COMPARISON:  Chest radiograph dated 04/21/2020. FINDINGS: Shallow inspiration with bibasilar atelectasis. No focal consolidation, pleural effusion, or pneumothorax. Stable cardiac silhouette. No acute osseous pathology. IMPRESSION: Bibasilar atelectasis. Electronically Signed   By: Anner Crete M.D.   On: 07/26/2020 21:26   CT Maxillofacial Wo Contrast  Result Date: 07/26/2020 CLINICAL DATA:  80 year old male with facial/maxillary abscess. EXAM: CT MAXILLOFACIAL WITHOUT CONTRAST TECHNIQUE: Multidetector CT imaging of the maxillofacial structures was performed. Multiplanar CT image reconstructions were also generated. COMPARISON:  None. FINDINGS: Evaluation of this exam is limited in the absence of intravenous contrast. Osseous: No acute fracture or subluxation. Degenerative changes of the TMJs bilaterally. Orbits: The globes and retro-orbital fat are preserved. Sinuses: Clear. Soft tissues: Small pocket air along the floor of the mouth the right abutting the right mandible. No drainable fluid collection or abscess. Limited intracranial: No  significant or unexpected finding. IMPRESSION: Small pocket of air along the floor of the mouth the right abutting the right mandible. No drainable fluid collection or abscess. Electronically Signed   By: Anner Crete M.D.   On: 07/26/2020 21:32     Subjective: No acute issues or events overnight   Discharge Exam: Vitals:   07/31/20 0727 07/31/20 1110  BP: (!) 154/85 (!) 112/56  Pulse:  90  Resp:  20  Temp:  99.1 F (37.3 C)  SpO2:  96%   Vitals:   07/30/20 2353 07/31/20 0426  07/31/20 0727 07/31/20 1110  BP: 121/63 126/79 (!) 154/85 (!) 112/56  Pulse: 69 71  90  Resp: 20 17  20   Temp: 98.8 F (37.1 C) 98.4 F (36.9 C)  99.1 F (37.3 C)  TempSrc: Oral Oral  Oral  SpO2: 100% 96%  96%  Weight:      Height:        General: Pt is alert, awake, not in acute distress HEENT: Left jaw swelling without erythema or dolor Cardiovascular: RRR, S1/S2 +, no rubs, no gallops Respiratory: CTA bilaterally, no wheezing, no rhonchi Abdominal: Soft, NT, ND, bowel sounds + Extremities: no edema, no cyanosis    The results of significant diagnostics from this hospitalization (including imaging, microbiology, ancillary and laboratory) are listed below for reference.     Microbiology: Recent Results (from the past 240 hour(s))  Blood Culture (routine x 2)     Status: None (Preliminary result)   Collection Time: 07/26/20  8:52 PM   Specimen: BLOOD  Result Value Ref Range Status   Specimen Description   Final    BLOOD LEFT ANTECUBITAL Performed at [facility], [address], [place], [place] 02/22/1941    Special Requests   Final    BOTTLES DRAWN AEROBIC AND ANAEROBIC Blood Culture adequate volume Performed at [facility], [address], [place], [place] [address]    Culture   Final    NO GROWTH 4 DAYS Performed at [facility], [place] [address] [place], [place] [address]    Report Status PENDING  Incomplete  Urine culture     Status: Abnormal   Collection Time: 07/26/20  8:52 PM   Specimen: Urine, Catheterized  Result Value Ref Range Status   Specimen Description   Final    URINE, CATHETERIZED Performed at [facility], [address] [place], [place] [address]    Special Requests   Final    NONE Performed at [facility], [address] [place], [place] [address]    Culture (A)  Final    <10,000 COLONIES/mL INSIGNIFICANT GROWTH Performed at [facility] [address], [place], [place] [address]    Report Status 07/28/2020 FINAL  Final  Blood Culture (routine x 2)     Status: None (Preliminary result)   Collection Time: 07/26/20  8:57 PM   Specimen: BLOOD  Result Value Ref Range Status   Specimen Description   Final    BLOOD RIGHT HAND Performed at [facility], [address] [place], [place] [address]    Special Requests   Final    BOTTLES DRAWN AEROBIC AND ANAEROBIC Blood Culture adequate volume Performed at [facility], [address], [place], [place] [address]    Culture   Final    NO GROWTH 4 DAYS Performed at [facility], [place] [address] [place], [place] [address]    Report Status  PENDING  Incomplete  Resp Panel by RT-PCR (Flu A&B, Covid) Nasopharyngeal Swab     Status: None   Collection Time: 07/26/20  9:05 PM   Specimen: Nasopharyngeal Swab; Nasopharyngeal(NP) swabs in vial transport medium  Result Value Ref Range Status   SARS Coronavirus 2 by RT PCR NEGATIVE NEGATIVE Final    Comment: (NOTE) SARS-CoV-2 target nucleic acids are NOT DETECTED.  The SARS-CoV-2 RNA is generally detectable in upper respiratory specimens during the acute phase of infection. The lowest concentration of SARS-CoV-2 viral copies this assay can detect is 138 copies/mL. A negative result does not preclude SARS-Cov-2 infection and should not be used as the sole basis for treatment or other patient management decisions. A negative result may occur with  improper specimen collection/handling, submission of specimen other than nasopharyngeal swab, presence of viral mutation(s) within the areas targeted by this assay, and inadequate number of viral copies(<138 copies/mL). A negative result must be combined with clinical observations, patient history, and epidemiological information. The expected result is Negative.  Fact Sheet for Patients:  EntrepreneurPulse.com.au  Fact Sheet for  Healthcare Providers:  IncredibleEmployment.be  This test is no t yet approved or cleared by the [place] FDA and  has been authorized for detection and/or diagnosis of SARS-CoV-2 by FDA under an Emergency Use Authorization (EUA). This EUA will remain  in effect (meaning this test can be used) for the duration of the COVID-19 declaration under Section 564(b)(1) of the Act, 21 U.S.C.section 360bbb-3(b)(1), unless the authorization is terminated  or revoked sooner.       Influenza A by PCR NEGATIVE NEGATIVE Final   Influenza B by PCR NEGATIVE NEGATIVE Final    Comment: (NOTE) The Xpert Xpress SARS-CoV-2/FLU/RSV plus assay is intended as an aid in the diagnosis of influenza from Nasopharyngeal swab specimens and should not be used as a sole basis for treatment. Nasal washings and aspirates are unacceptable for Xpert Xpress SARS-CoV-2/FLU/RSV testing.  Fact Sheet for Patients: EntrepreneurPulse.com.au  Fact Sheet for Healthcare Providers: IncredibleEmployment.be  This test is not yet approved or cleared by the [place] FDA and has been authorized for detection and/or diagnosis of SARS-CoV-2 by FDA under an Emergency Use Authorization (EUA). This EUA will remain in effect (meaning this test can be used) for the duration of the COVID-19 declaration under Section 564(b)(1) of the Act, 21 U.S.C. section 360bbb-3(b)(1), unless the authorization is terminated or revoked.  Performed at [facility], [address], [place], [place] [address]      Labs: BNP (last 3 results) No results for input(s): BNP in the last 8760 hours. Basic Metabolic Panel: Recent Labs  Lab 07/26/20 2052 07/28/20 0119 07/29/20 0157 07/30/20 0204 07/31/20 0247  NA 135 134* 136 132* 136  K 4.2 4.1 3.8 3.6 3.8  CL 99 103 102 101 108  CO2 27 20* 25 21* 24  GLUCOSE 173* 144* 129* 138* 127*  BUN 22 19 19 20 17    CREATININE 1.26* 1.30* 1.24 1.04 0.98  CALCIUM 10.4* 9.2 9.0 8.8* 8.8*   Liver Function Tests: Recent Labs  Lab 07/26/20 2052  AST 17  ALT 23  ALKPHOS 67  BILITOT 0.9  PROT 7.6  ALBUMIN 4.5   No results for input(s): LIPASE, AMYLASE in the last 168 hours. No results for input(s): AMMONIA in the last 168 hours. CBC: Recent Labs  Lab 07/26/20 2052 07/28/20 0119 07/29/20 0157 07/30/20 0204 07/31/20 0247  WBC 13.2* 12.8* 9.3 7.1 7.2  NEUTROABS 11.5*  --   --   --   --  HGB 14.4 13.8 13.2 11.9* 11.0*  HCT 42.6 41.4 40.3 35.6* 32.5*  MCV 92.6 94.3 93.9 92.5 93.4  PLT 220 155 163 178 239   Cardiac Enzymes: No results for input(s): CKTOTAL, CKMB, CKMBINDEX, TROPONINI in the last 168 hours. BNP: Invalid input(s): POCBNP CBG: Recent Labs  Lab 07/30/20 1109 07/30/20 1700 07/30/20 2123 07/31/20 0634 07/31/20 1104  GLUCAP 180* 122* 167* 122* 193*   D-Dimer No results for input(s): DDIMER in the last 72 hours. Hgb A1c No results for input(s): HGBA1C in the last 72 hours. Lipid Profile No results for input(s): CHOL, HDL, LDLCALC, TRIG, CHOLHDL, LDLDIRECT in the last 72 hours. Thyroid function studies No results for input(s): TSH, T4TOTAL, T3FREE, THYROIDAB in the last 72 hours.  Invalid input(s): FREET3 Anemia work up No results for input(s): VITAMINB12, FOLATE, FERRITIN, TIBC, IRON, RETICCTPCT in the last 72 hours. Urinalysis    Component Value Date/Time   COLORURINE COLORLESS (A) 07/26/2020 2052   APPEARANCEUR CLEAR 07/26/2020 2052   LABSPEC 1.014 07/26/2020 2052   PHURINE 6.5 07/26/2020 2052   GLUCOSEU NEGATIVE 07/26/2020 2052   HGBUR NEGATIVE 07/26/2020 2052   BILIRUBINUR NEGATIVE 07/26/2020 2052   [place] NEGATIVE 07/26/2020 2052   PROTEINUR NEGATIVE 07/26/2020 2052   NITRITE NEGATIVE 07/26/2020 2052   LEUKOCYTESUR SMALL (A) 07/26/2020 2052   Sepsis Labs Invalid input(s): PROCALCITONIN,  WBC,  LACTICIDVEN Microbiology Recent Results (from the past  240 hour(s))  Blood Culture (routine x 2)     Status: None (Preliminary result)   Collection Time: 07/26/20  8:52 PM   Specimen: BLOOD  Result Value Ref Range Status   Specimen Description   Final    BLOOD LEFT ANTECUBITAL Performed at [facility], [address], [place], [place] [address]    Special Requests   Final    BOTTLES DRAWN AEROBIC AND ANAEROBIC Blood Culture adequate volume Performed at [facility], [address], [place], [place] [address]    Culture   Final    NO GROWTH 4 DAYS Performed at [facility], [place] [address] [place], [place] [address]    Report Status PENDING  Incomplete  Urine culture     Status: Abnormal   Collection Time: 07/26/20  8:52 PM   Specimen: Urine, Catheterized  Result Value Ref Range Status   Specimen Description   Final    URINE, CATHETERIZED Performed at [facility], [address], [place], [place] [address]    Special Requests   Final    NONE Performed at [facility], [address] [place], [place] [address]    Culture (A)  Final    <10,000 COLONIES/mL INSIGNIFICANT GROWTH Performed at [facility] [address], [place], [place] [address]    Report Status 07/28/2020 FINAL  Final  Blood Culture (routine x 2)     Status: None (Preliminary result)   Collection Time: 07/26/20  8:57 PM   Specimen: BLOOD  Result Value Ref Range Status   Specimen Description   Final    BLOOD RIGHT HAND Performed at [facility], [address] [place], [place] [address]    Special Requests   Final    BOTTLES DRAWN AEROBIC AND ANAEROBIC Blood Culture adequate volume Performed at [facility], [address], [place], [place] [address]    Culture   Final    NO GROWTH 4 DAYS Performed at [facility], [place] [address], [place], [place] [address]  Report Status PENDING  Incomplete  Resp Panel by RT-PCR  (Flu A&B, Covid) Nasopharyngeal Swab     Status: None   Collection Time: 07/26/20  9:05 PM   Specimen: Nasopharyngeal Swab; Nasopharyngeal(NP) swabs in vial transport medium  Result Value Ref Range Status   SARS Coronavirus 2 by RT PCR NEGATIVE NEGATIVE Final    Comment: (NOTE) SARS-CoV-2 target nucleic acids are NOT DETECTED.  The SARS-CoV-2 RNA is generally detectable in upper respiratory specimens during the acute phase of infection. The lowest concentration of SARS-CoV-2 viral copies this assay can detect is 138 copies/mL. A negative result does not preclude SARS-Cov-2 infection and should not be used as the sole basis for treatment or other patient management decisions. A negative result may occur with  improper specimen collection/handling, submission of specimen other than nasopharyngeal swab, presence of viral mutation(s) within the areas targeted by this assay, and inadequate number of viral copies(<138 copies/mL). A negative result must be combined with clinical observations, patient history, and epidemiological information. The expected result is Negative.  Fact Sheet for Patients:  EntrepreneurPulse.com.au  Fact Sheet for Healthcare Providers:  IncredibleEmployment.be  This test is no t yet approved or cleared by the [place] FDA and  has been authorized for detection and/or diagnosis of SARS-CoV-2 by FDA under an Emergency Use Authorization (EUA). This EUA will remain  in effect (meaning this test can be used) for the duration of the COVID-19 declaration under Section 564(b)(1) of the Act, 21 U.S.C.section 360bbb-3(b)(1), unless the authorization is terminated  or revoked sooner.       Influenza A by PCR NEGATIVE NEGATIVE Final   Influenza B by PCR NEGATIVE NEGATIVE Final    Comment: (NOTE) The Xpert Xpress SARS-CoV-2/FLU/RSV plus assay is intended as an aid in the diagnosis of influenza from Nasopharyngeal swab specimens  and should not be used as a sole basis for treatment. Nasal washings and aspirates are unacceptable for Xpert Xpress SARS-CoV-2/FLU/RSV testing.  Fact Sheet for Patients: EntrepreneurPulse.com.au  Fact Sheet for Healthcare Providers: IncredibleEmployment.be  This test is not yet approved or cleared by the [place] FDA and has been authorized for detection and/or diagnosis of SARS-CoV-2 by FDA under an Emergency Use Authorization (EUA). This EUA will remain in effect (meaning this test can be used) for the duration of the COVID-19 declaration under Section 564(b)(1) of the Act, 21 U.S.C. section 360bbb-3(b)(1), unless the authorization is terminated or revoked.  Performed at [facility], [address] [place], [place] [address]      Time coordinating discharge: Over 30 minutes  SIGNED:   Little Ishikawa, DO [facility] 07/31/2020, 1:52 PM Pager   If 7PM-7AM, please contact night-coverage [email or web address]

## 2020-07-31 NOTE — Progress Notes (Signed)
Called by RN for pt  Having SOB. Pt is clear and in no distress. Pt's vitals are stable 94% on room air, HR 74, rr 20 BC clear

## 2020-07-31 NOTE — Care Management Important Message (Signed)
Important Message  Patient Details  Name: DERRYL UHER MRN: [id number] Date of Birth: 01/13/1941   Medicare Important Message Given:  Yes     Shelda Altes 07/31/2020, 10:02 AM

## 2020-07-31 NOTE — Plan of Care (Signed)
  Problem: Clinical Measurements: Goal: Will remain free from infection Outcome: Progressing Goal: Respiratory complications will improve Outcome: Progressing Goal: Cardiovascular complication will be avoided Outcome: Progressing   

## 2020-07-31 NOTE — Progress Notes (Signed)
Subjective: No new complaints   Antibiotics:  Anti-infectives (From admission, onward)   Start     Dose/Rate Route Frequency Ordered Stop   07/31/20 1200  linezolid (ZYVOX) tablet 600 mg        600 mg Oral Every 12 hours 07/31/20 0956     07/31/20 0000  linezolid (ZYVOX) 600 MG tablet        600 mg Oral 2 times daily 07/31/20 0955 08/07/20 2359   07/30/20 2300  vancomycin (VANCOREADY) IVPB 1250 mg/250 mL  Status:  Discontinued        1,250 mg 166.7 mL/hr over 90 Minutes Intravenous Every 24 hours 07/30/20 2225 07/31/20 0956   07/27/20 2200  vancomycin (VANCOREADY) IVPB 1250 mg/250 mL  Status:  Discontinued        1,250 mg 166.7 mL/hr over 90 Minutes Intravenous Every 24 hours 07/26/20 2103 07/27/20 1004   07/27/20 2200  vancomycin (VANCOREADY) IVPB 1000 mg/200 mL  Status:  Discontinued        1,000 mg 200 mL/hr over 60 Minutes Intravenous Every 24 hours 07/27/20 1004 07/30/20 2225   07/27/20 1400  piperacillin-tazobactam (ZOSYN) IVPB 3.375 g  Status:  Discontinued        3.375 g 12.5 mL/hr over 240 Minutes Intravenous Every 8 hours 07/27/20 0954 07/30/20 1131   07/26/20 2130  vancomycin (VANCOCIN) IVPB 1000 mg/200 mL premix        1,000 mg 200 mL/hr over 60 Minutes Intravenous Every 1 hr x 2 07/26/20 2059 07/27/20 0000   07/26/20 2100  piperacillin-tazobactam (ZOSYN) IVPB 3.375 g        3.375 g 100 mL/hr over 30 Minutes Intravenous  Once 07/26/20 2054 07/26/20 2200      Medications: Scheduled Meds: . aspirin  81 mg Oral BID  . docusate sodium  100 mg Oral Daily  . donepezil  10 mg Oral QHS  . enoxaparin (LOVENOX) injection  40 mg Subcutaneous Q24H  . fesoterodine  8 mg Oral Daily  . insulin aspart  0-15 Units Subcutaneous TID WC  . linezolid  600 mg Oral Q12H  . lisinopril  20 mg Oral Daily  . mirabegron ER  25 mg Oral Daily  . rosuvastatin  5 mg Oral QODAY  . sodium chloride flush  3 mL Intravenous Q12H   Continuous Infusions: . lactated ringers 50 mL/hr  at 07/30/20 1504   PRN Meds:.acetaminophen **OR** acetaminophen, ondansetron **OR** ondansetron (ZOFRAN) IV    Objective: Weight change:   Intake/Output Summary (Last 24 hours) at 07/31/2020 1126 Last data filed at 07/31/2020 0052 Gross per 24 hour  Intake 939.38 ml  Output --  Net 939.38 ml   Blood pressure (!) 112/56, pulse 90, temperature 99.1 F (37.3 C), temperature source Oral, resp. rate 20, height 5\' 10"  (1.778 m), weight 92.5 kg, SpO2 96 %. Temp:  [98.3 F (36.8 C)-99.1 F (37.3 C)] 99.1 F (37.3 C) (06/03 1110) Pulse Rate:  [69-90] 90 (06/03 1110) Resp:  [17-20] 20 (06/03 1110) BP: (112-154)/(56-85) 112/56 (06/03 1110) SpO2:  [96 %-100 %] 96 % (06/03 1110)  Physical Exam: Physical Exam Constitutional:      Appearance: He is well-developed.  HENT:     Head: Normocephalic and atraumatic.  Eyes:     General:        Right eye: No discharge.        Left eye: No discharge.     Extraocular Movements: Extraocular movements intact.  Conjunctiva/sclera: Conjunctivae normal.  Neck:   Cardiovascular:     Rate and Rhythm: Normal rate and regular rhythm.  Pulmonary:     Effort: Pulmonary effort is normal. No respiratory distress.     Breath sounds: No wheezing.  Abdominal:     General: There is no distension.     Palpations: Abdomen is soft.  Musculoskeletal:        General: Normal range of motion.     Cervical back: Normal range of motion and neck supple.  Skin:    General: Skin is warm and dry.     Findings: No erythema or rash.  Neurological:     General: No focal deficit present.     Mental Status: He is alert and oriented to person, place, and time.  Psychiatric:        Mood and Affect: Mood normal.        Behavior: Behavior normal.        Thought Content: Thought content normal.        Cognition and Memory: He exhibits impaired remote memory.        Judgment: Judgment normal.      CBC:    BMET Recent Labs    07/30/20 0204 07/31/20 0247  NA  132* 136  K 3.6 3.8  CL 101 108  CO2 21* 24  GLUCOSE 138* 127*  BUN 20 17  CREATININE 1.04 0.98  CALCIUM 8.8* 8.8*     Liver Panel  No results for input(s): PROT, ALBUMIN, AST, ALT, ALKPHOS, BILITOT, BILIDIR, IBILI in the last 72 hours.     Sedimentation Rate No results for input(s): ESRSEDRATE in the last 72 hours. C-Reactive Protein No results for input(s): CRP in the last 72 hours.  Micro Results: Recent Results (from the past 720 hour(s))  Blood Culture (routine x 2)     Status: None (Preliminary result)   Collection Time: 07/26/20  8:52 PM   Specimen: BLOOD  Result Value Ref Range Status   Specimen Description   Final    BLOOD LEFT ANTECUBITAL Performed at [facility], [address], [place], [place] [address]    Special Requests   Final    BOTTLES DRAWN AEROBIC AND ANAEROBIC Blood Culture adequate volume Performed at [facility], [address], [place], [place] [address]    Culture   Final    NO GROWTH 4 DAYS Performed at [facility], [place] [address], [place], [place] [address]    Report Status PENDING  Incomplete  Urine culture     Status: Abnormal   Collection Time: 07/26/20  8:52 PM   Specimen: Urine, Catheterized  Result Value Ref Range Status   Specimen Description   Final    URINE, CATHETERIZED Performed at [facility], [address], [place], [place] [address]    Special Requests   Final    NONE Performed at [facility], [address] [place], [place] [address]    Culture (A)  Final    <10,000 COLONIES/mL INSIGNIFICANT GROWTH Performed at [facility] [address], [place], [place] [address]    Report Status 07/28/2020 FINAL  Final  Blood Culture (routine x 2)     Status: None (Preliminary result)   Collection Time: 07/26/20  8:57 PM   Specimen: BLOOD  Result Value Ref Range Status   Specimen Description   Final    BLOOD RIGHT  HAND Performed at [facility], [place]  [place], [place], [place] [address]    Special Requests   Final    BOTTLES DRAWN AEROBIC AND ANAEROBIC Blood Culture adequate volume Performed at [facility], [address] [place], [place] [address]    Culture   Final    NO GROWTH 4 DAYS Performed at [facility], [place] [address], [place], [place] [address]    Report Status PENDING  Incomplete  Resp Panel by RT-PCR (Flu A&B, Covid) Nasopharyngeal Swab     Status: None   Collection Time: 07/26/20  9:05 PM   Specimen: Nasopharyngeal Swab; Nasopharyngeal(NP) swabs in vial transport medium  Result Value Ref Range Status   SARS Coronavirus 2 by RT PCR NEGATIVE NEGATIVE Final    Comment: (NOTE) SARS-CoV-2 target nucleic acids are NOT DETECTED.  The SARS-CoV-2 RNA is generally detectable in upper respiratory specimens during the acute phase of infection. The lowest concentration of SARS-CoV-2 viral copies this assay can detect is 138 copies/mL. A negative result does not preclude SARS-Cov-2 infection and should not be used as the sole basis for treatment or other patient management decisions. A negative result may occur with  improper specimen collection/handling, submission of specimen other than nasopharyngeal swab, presence of viral mutation(s) within the areas targeted by this assay, and inadequate number of viral copies(<138 copies/mL). A negative result must be combined with clinical observations, patient history, and epidemiological information. The expected result is Negative.  Fact Sheet for Patients:  EntrepreneurPulse.com.au  Fact Sheet for Healthcare Providers:  IncredibleEmployment.be  This test is no t yet approved or cleared by the [place] FDA and  has been authorized for detection and/or diagnosis of SARS-CoV-2 by FDA under an Emergency Use Authorization (EUA). This EUA will remain  in  effect (meaning this test can be used) for the duration of the COVID-19 declaration under Section 564(b)(1) of the Act, 21 U.S.C.section 360bbb-3(b)(1), unless the authorization is terminated  or revoked sooner.       Influenza A by PCR NEGATIVE NEGATIVE Final   Influenza B by PCR NEGATIVE NEGATIVE Final    Comment: (NOTE) The Xpert Xpress SARS-CoV-2/FLU/RSV plus assay is intended as an aid in the diagnosis of influenza from Nasopharyngeal swab specimens and should not be used as a sole basis for treatment. Nasal washings and aspirates are unacceptable for Xpert Xpress SARS-CoV-2/FLU/RSV testing.  Fact Sheet for Patients: EntrepreneurPulse.com.au  Fact Sheet for Healthcare Providers: IncredibleEmployment.be  This test is not yet approved or cleared by the [place] FDA and has been authorized for detection and/or diagnosis of SARS-CoV-2 by FDA under an Emergency Use Authorization (EUA). This EUA will remain in effect (meaning this test can be used) for the duration of the COVID-19 declaration under Section 564(b)(1) of the Act, 21 U.S.C. section 360bbb-3(b)(1), unless the authorization is terminated or revoked.  Performed at [facility], [address] [place], [place] [address]     Studies/Results: MR NECK SOFT TISSUE ONLY W WO CONTRAST  Result Date: 07/30/2020 CLINICAL DATA:  Parotid region mass. Additional history obtained from [name] [name] left parotiditis, rule out mass. EXAM: MRI OF THE NECK WITH CONTRAST TECHNIQUE: Multiplanar, multisequence MR imaging was performed following the administration of intravenous contrast. CONTRAST:  51mL GADAVIST GADOBUTROL 1 MMOL/ML IV SOLN COMPARISON:  Maxillofacial CT 07/26/2020. FINDINGS: Intermittently motion degraded examination, limiting evaluation. Pharynx and larynx: Within limitations of motion degradation, no definite swelling or discrete mass is  identified within the oral cavity, pharynx or larynx. Salivary glands: There is  asymmetric enhancement of the left parotid gland, and findings may reflect parotiditis (for instance as seen on series 8, image 20). Within the limitations of motion degradation, no definite discrete left parotid mass is identified. The right parotid and bilateral submandibular glands are unremarkable. Thyroid: 1.6 cm right thyroid lobe nodule (series 8, image 49). Lymph nodes: No pathologically enlarged cervical chain lymph nodes are identified. Vascular: Flow voids maintained within the major vascular structures of the neck. Limited intracranial: No evidence of acute intracranial abnormality within the field of view. Generalized atrophy and chronic small vessel ischemic disease at the imaged levels. Visualized orbits: Incompletely imaged. No mass or acute finding within the imaged orbits. Mastoids and visualized paranasal sinuses: Mild bilateral ethmoid and right maxillary sinus mucosal thickening. No significant mastoid effusion. Skeleton: Cervical spondylosis with multilevel disc space narrowing, disc bulges, endplate spurring, uncovertebral hypertrophy and facet arthrosis. Disc space narrowing is moderate/advanced at C5-C6 and C6-C7. Mild degenerative endplate edema and enhancement anteriorly and C6-C7. Upper chest: No signal abnormality within the imaged lung apices. IMPRESSION: Multiple sequences are significantly motion degraded, limiting evaluation. There is asymmetric enhancement of the left parotid gland, which may reflect parotiditis. No definite discrete mass is identified within the left parotid gland, although motion degradation limits evaluation. If the palpable abnormality does not resolve with the current therapy, a follow-up contrast-enhanced neck CT should be considered. Mild bilateral ethmoid and right maxillary sinus mucosal thickening. 1.6 cm right thyroid lobe nodule. A nonemergent thyroid ultrasound is  recommended for further evaluation. Cervical spondylosis, as described. Electronically Signed   By: Kellie Simmering DO   On: 07/30/2020 18:41      Assessment/Plan:  INTERVAL HISTORY: MRI confirms enlargement of parotid gland consistent with his clinical history of parotitis   Principal Problem:   Acute parotitis Active Problems:   Dyslipidemia   Stage 3a chronic kidney disease (HCC)   Benign essential hypertension   Chronic diastolic CHF (congestive heart failure) (HCC)   Alzheimer's dementia ([place])   Facial cellulitis    [name] [name] is a 80 y.o. male with dementia sleep apnea coronary artery disease who developed left-sided facial swelling on May 18 and was prescribed Augmentin for parotitis, then with lack of improvement provement was started on clindamycin but still did not improve and was ultimately hospitalized with fevers and large parotid gland.  He responded well to vancomycin and Zosyn defervesced since of his fever and resolution of his leukocytosis he also had improvement of his parotid gland inflammation.  Yesterday when I saw the patient there was some concern by the patient and the daughter that he had not had improvement in the inflammation in his face.  Said because we do not have radiographic prove in that area was a bit more superior than I am used to seeing for proctitis we obtained an MRI of his neck which does show some asymmetric enhancement of the left parotid gland.  #1 Parotitis: We will have him continue pleat a 7-day course of  Check on him in my clinic as well.    [name] [name] has an appointment on 08/10/2020 at 930AM with Dr. Tommy Medal  [facility] is located in the [facility] at  [place] in [place].  [address], which is located to the left of the elevators.  Phone: [phone]  Fax: [phone]  [email or web address]  I spent more than 35 minutes with the  patient including greater than 50% of  time in face to face counseling of the patient personally reviewing radiographs, long with pertinent laboratory microbiological data review of medical records and in coordination of his care.   He should arrive 15 to 30 minutes prior to his appointment.  I will sign off for now please call further questions I have given him a card with his appointment timehis son took this.    LOS: 4 days   Alcide Evener 07/31/2020, 11:26 AM

## 2020-08-01 LAB — CULTURE, BLOOD (ROUTINE X 2)
Culture: NO GROWTH
Culture: NO GROWTH
Special Requests: ADEQUATE
Special Requests: ADEQUATE

## 2020-08-02 ENCOUNTER — Telehealth: Payer: Self-pay | Admitting: Internal Medicine

## 2020-08-02 NOTE — Telephone Encounter (Signed)
Received after-hours page from patient's daughter regarding concern for side effect to linezolid.  She reports that since hospital discharge her father has complained of some "burning with urination" that she thinks may be attributable to linezolid.  This dysuria has started to improve today and he has otherwise been without fevers or other new complaints.  She is also concerned regarding the side effects of linezolid after reading about it on line.  Discussed with her that burning with urination is not a known side effect to linezolid that I was aware of.  Also discussed that most of the concerning adverse effects related to linezolid are with prolonged durations of therapy but her father has only been prescribed a 7-day course.  Advised her to continue to monitor her father closely for urinary symptoms as this might be indicative of a UTI not adequately covered by linezolid.  She will keep [place] abreast of any new changes but for now we will continue taking linezolid as prescribed.  She has follow-up arranged with Dr. Tommy Medal on 08/10/2020 and will be calling her primary care physician for hospital follow-up tomorrow morning.

## 2020-08-06 ENCOUNTER — Telehealth: Payer: Self-pay | Admitting: Cardiology

## 2020-08-06 NOTE — Telephone Encounter (Signed)
Returned call to [employer], daughter, she states that pt is SOB(this has been happening for about a year" and fatigued for the last month "or so" she/pt does not think that it is "bad enough" to go to the ER. She would just like an appt w/Dr [place]. His weight on Monday 208# then yesterday 204 and today 204 also. She "does not see much" swelling.  Pt does have an UTI and was given antibiotics for this. But she does not know the name. I do not see it on pt's med list. Pt is so SOB he is unable to exercise. He used to walk a mile a mile and 1/2 every day and is not abl to do this he is barley able to walk around the house without getting SOB. He is taking medication as ordered.  I have made an appointment for our 1st available appt 09-07-20 with APP. She will take to the ER if they feel necessary and will call if this appt is "not necessary".

## 2020-08-06 NOTE — Telephone Encounter (Signed)
Pt c/o Shortness Of Breath: STAT if SOB developed within the last 24 hours or pt is noticeably SOB on the phone  1. Are you currently SOB (can you hear that pt is SOB on the phone)?   2. How long have you been experiencing SOB? Several months  but it is getting progressively worse   3. Are you SOB when sitting or when up moving around?   4. Are you currently experiencing any other symptoms? Infection of parotid gland. Pt went to urgent care and is on antibiotics   Daughter would like the patient to be seen by Dr. [place]ique asap

## 2020-08-10 ENCOUNTER — Encounter: Payer: Self-pay | Admitting: Infectious Disease

## 2020-08-10 ENCOUNTER — Other Ambulatory Visit: Payer: Self-pay

## 2020-08-10 ENCOUNTER — Ambulatory Visit (INDEPENDENT_AMBULATORY_CARE_PROVIDER_SITE_OTHER): Payer: Medicare Other | Admitting: Infectious Disease

## 2020-08-10 VITALS — BP 170/74 | HR 78 | Temp 98.1°F | Ht 70.0 in | Wt 204.0 lb

## 2020-08-10 DIAGNOSIS — R300 Dysuria: Secondary | ICD-10-CM

## 2020-08-10 DIAGNOSIS — G308 Other Alzheimer's disease: Secondary | ICD-10-CM | POA: Diagnosis not present

## 2020-08-10 DIAGNOSIS — K1121 Acute sialoadenitis: Secondary | ICD-10-CM | POA: Diagnosis not present

## 2020-08-10 DIAGNOSIS — F0280 Dementia in other diseases classified elsewhere without behavioral disturbance: Secondary | ICD-10-CM

## 2020-08-10 DIAGNOSIS — N1831 Chronic kidney disease, stage 3a: Secondary | ICD-10-CM

## 2020-08-10 HISTORY — DX: Dysuria: R30.0

## 2020-08-10 NOTE — Progress Notes (Signed)
Subjective:  Chief complaint he had had some dysuria and flank pain recently still has area thought to be parotitis that is enlarged on exam  Patient ID: [name] [name], male    DOB: 12-23-1940, 80 y.o.   MRN: [id number]  HPI  80 y.o. male with dementia sleep apnea coronary artery disease who developed left-sided facial swelling on May 18 and was prescribed Augmentin for parotitis, then with lack of improvement provement was started on clindamycin but still did not improve and was ultimately hospitalized with fevers and large parotid gland.  He responded well to vancomycin and Zosyn defervesced since of his fever and resolution of his leukocytosis he also had improvement of his parotid gland inflammation.   When I saw the patient hospital on June 2 there was some concern by the patient and the daughter that he had not had improvement in the inflammation in his face.  We therefore obtained an MRI to better evaluate the area radiographically.  This did show some asymmetric enhancement of the left parotid gland.  We gave him an additional 7-day course of Zyvox which she completed.  He has been off the Zyvox since last week and not had any increase in swelling or erythema or any systemic symptoms of infection.  He did see his primary care physician with [facility] with some dysuria and flank pain.  Urinalysis was positive urine culture was positive for low colony counts of Klebsiella pneumonia 10-50,000 colony-forming units per mL.  He was prescribed Macrobid which the organism was sensitive to.  He appears to be without dysuria now or flank pain (if latter had been evidence of a more complicated higher UTI he would have needed to be prescribed a different antibiotic since macrobid only act at level of the bladder.)  This is culture is not overwhelming for infection by any means.  He is going to be seeing ENT this Friday to reevaluate area on his face and potential parotitis.  Past Medical History:   Diagnosis Date   Alzheimer disease ([place])    CHF (congestive heart failure) ([facility])    Chronic kidney disease    mild insuffiency   Coronary artery disease    LHC 4/10: Mid LAD 40-50%, then 70%, OM1 20-30%, EF 65%. Mid LAD FFR 0.89 (not hemodynamically significant). Medical therapy was continued.   Hyperlipidemia    LBBB (left bundle branch block) 06/2018   Obstructive sleep apnea     Past Surgical History:  Procedure Laterality Date   CARDIAC CATHETERIZATION  05/02/2003   single vessel,moderate stenosis mid LAD   CARDIAC CATHETERIZATION  06/12/2008   continue med. therapy   right shoulder surgery     TOTAL HIP ARTHROPLASTY Left 09/14/2018   Procedure: LEFT TOTAL HIP ARTHROPLASTY ANTERIOR APPROACH;  Surgeon: Mcarthur Rossetti, MD;  Location: [facility];  Service: Orthopedics;  Laterality: Left;    Family History  Problem Relation Age of Onset   Cancer Mother    Hypertension Mother    Other Father        old age   Hypertension Father       Social History   Socioeconomic History   Marital status: Widowed    Spouse name: Not on file   Number of children: 2   Years of education: Not on file   Highest education level: Not on file  Occupational History   Occupation: [employer]  Tobacco Use   Smoking status: Former    Pack years: 0.00  Types: Pipe    Quit date: 02/28/1990    Years since quitting: 30.4   Smokeless tobacco: Never   Tobacco comments:    smoked pipe only   Vaping Use   Vaping Use: Never used  Substance and Sexual Activity   Alcohol use: No    Alcohol/week: 0.0 standard drinks    Comment: rarely wine   Drug use: No   Sexual activity: Not on file  Other Topics Concern   Not on file  Social History Narrative   Not on file   Social Determinants of Health   Financial Resource Strain: Not on file  Food Insecurity: Not on file  Transportation Needs: Not on file  Physical Activity: Not on file  Stress: Not on file  Social Connections: Not on  file    Allergies  Allergen Reactions   Darifenacin     Other reaction(s): Mental Status Changes (intolerance) PT STATES IT MAKES HIM "BONKERS" AND VERY TIRED   Enablex [Darifenacin Hydrobromide Er]     PT STATES IT MAKES HIM "BONKERS" AND VERY TIRED   Ceftriaxone     Unknown reaction   Atorvastatin     Muscle aches/weakness   Ezetimibe     Other reaction(s): Other (See Comments) Muscle aches/weakness   Levaquin [Levofloxacin] Diarrhea    Caused diarheer a    Other     Intolerance to zetia and lipitor   Oxybutynin Other (See Comments)    Memory impairment, temperament changes   Sulfamethoxazole     Other reaction(s): Other (See Comments) Childhood allergy   Sulfasalazine     Other reaction(s): Other (See Comments) Unknown per daughter   Sulfonamide Derivatives      Current Outpatient Medications:    Ascorbic Acid (VITAMIN C) 1000 MG tablet, Take 1,000 mg by mouth daily., Disp: , Rfl:    aspirin 81 MG chewable tablet, Chew 1 tablet (81 mg total) by mouth 2 (two) times daily., Disp: 35 tablet, Rfl: 0   calcium-vitamin D (OSCAL WITH D) 500-200 MG-UNIT per tablet, Take 1 tablet by mouth 2 (two) times daily., Disp: , Rfl:    docusate sodium (COLACE) 100 MG capsule, Take 100 mg by mouth daily., Disp: , Rfl:    donepezil (ARICEPT) 10 MG tablet, Take 10 mg by mouth at bedtime., Disp: , Rfl:    fesoterodine (TOVIAZ) 8 MG TB24 tablet, Take 8 mg by mouth daily., Disp: , Rfl:    fluticasone (FLONASE) 50 MCG/ACT nasal spray, Place 2 sprays into both nostrils daily as needed for allergies., Disp: , Rfl:    furosemide (LASIX) 20 MG tablet, Take 20 mg by mouth every other day., Disp: 30 tablet, Rfl: 6   lisinopril (ZESTRIL) 20 MG tablet, Take 20 mg by mouth daily., Disp: , Rfl:    mirabegron ER (MYRBETRIQ) 25 MG TB24 tablet, Take 25 mg by mouth daily. , Disp: , Rfl:    Multiple Vitamin (MULTIVITAMIN) tablet, Take 1 tablet by mouth daily., Disp: , Rfl:    Omega-3 Fatty Acids (FISH OIL)  1000 MG CAPS, Take 1 capsule (1,000 mg total) by mouth 2 (two) times daily., Disp: 90 capsule, Rfl: 0   Psyllium (METAMUCIL PO), Take 5 mLs by mouth daily. Mix with water or juice, Disp: , Rfl:    rosuvastatin (CRESTOR) 10 MG tablet, TAKE 1/2 TABLET BY MOUTH EVERY OTHER DAY (Patient taking differently: Take 5 mg by mouth every other day.), Disp: 45 tablet, Rfl: 2  Review of Systems  Reason unable to  perform ROS: dementia.      Objective:   Physical Exam Constitutional:      General: He is not in acute distress.    Appearance: Normal appearance. He is well-developed. He is not ill-appearing or diaphoretic.  HENT:     Head: Normocephalic and atraumatic.     Comments: Left side of face still with some swelling present    Right Ear: Hearing and external ear normal.     Left Ear: Hearing and external ear normal.     Nose: No nasal deformity or rhinorrhea.  Eyes:     General: No scleral icterus.    Conjunctiva/sclera: Conjunctivae normal.     Right eye: Right conjunctiva is not injected.     Left eye: Left conjunctiva is not injected.     Pupils: Pupils are equal, round, and reactive to light.  Neck:     Vascular: No JVD.  Cardiovascular:     Rate and Rhythm: Normal rate and regular rhythm.     Heart sounds: Normal heart sounds, S1 normal and S2 normal. No murmur heard.   No friction rub.  Pulmonary:     Effort: Pulmonary effort is normal. No respiratory distress.     Breath sounds: No wheezing.  Abdominal:     General: There is no distension.     Palpations: Abdomen is soft.  Musculoskeletal:        General: Normal range of motion.     Right shoulder: Normal.     Left shoulder: Normal.     Cervical back: Normal range of motion and neck supple.     Right hip: Normal.     Left hip: Normal.     Right knee: Normal.     Left knee: Normal.  Lymphadenopathy:     Head:     Right side of head: No submandibular, preauricular or posterior auricular adenopathy.     Left side of head:  No submandibular, preauricular or posterior auricular adenopathy.     Cervical: No cervical adenopathy.     Right cervical: No superficial or deep cervical adenopathy.    Left cervical: No superficial or deep cervical adenopathy.  Skin:    General: Skin is warm and dry.     Coloration: Skin is not pale.     Findings: No abrasion, bruising, ecchymosis, erythema, lesion or rash.     Nails: There is no clubbing.  Neurological:     General: No focal deficit present.     Mental Status: He is alert and oriented to person, place, and time. Mental status is at baseline.     Sensory: No sensory deficit.     Coordination: Coordination normal.     Gait: Gait normal.  Psychiatric:        Attention and Perception: He is attentive.        Mood and Affect: Mood normal.        Speech: Speech normal.        Behavior: Behavior normal. Behavior is cooperative.        Thought Content: Thought content normal.        Cognition and Memory: Memory is impaired. He exhibits impaired recent memory and impaired remote memory.        Judgment: Judgment normal.          Assessment & Plan:  Parotitis: He has received ample antibiotic therapy for bacterial parotitis.  I have wondered since I first met him whether the area on his cheek does  not fact represent swollen parotid gland may or may represent coming hours in the soft tissues.  He certainly had fevers and systemic system symptoms to suggest parotitis but he should have received ample treatment by now.  Perhaps he might need a biopsy he is going to see ear nose and throat later this week.  Dysuria: He has been treated with Macrobid.  I spent more than 73mnutes with the patient including greater than 50% of time in face to face counseling of the patient personally reviewing radiographs, along with pertinent laboratory microbiological data review of medical records and in coordination of his care.

## 2020-08-31 NOTE — Progress Notes (Signed)
Cardiology Office Note:    Date:  09/07/2020   ID:  ADON GEHLHAUSEN, DOB Aug 16, 1940, MRN [id number]  PCP:  Chesley Noon, MD  Cardiologist:  Peter Martinique, MD  Electrophysiologist:  None   Referring MD: Chesley Noon, MD   Chief Complaint: follow-up of shortness of breath  History of Present Illness:    [name] [name] is a 80 y.o. male with a history of moderate non-obstructive CAD on cardiac catheterization in 2010 chronic shortness of breath, LBBB, hypertension, hyperlipidemia, obstructive sleep apnea, CKD stage III, and Alzheimer disease who is followed by Dr. Martinique and presents today for follow-up of shortness of breath.  Patient has a history of non-obstructive CAD. Cardiac catheterization in 2010 showed single vessel CAD with intermediate stenosis of LAD. FFR was negative so medical therapy was recommended. Myoview in 06/2018 which was done for further evaluation of new LBBB on EKG was low risk with no evidence of ischemia. Patient was last seen by Dr. Martinique in 04/2020 at which time he reported shortness of breath when bending over. But denied any chest pain or other symptoms of CHF. He had no clinical signs of CHF. Echo was ordered and showed LVEF of 60-65% with normal wall motion, mild LVH, and grade 1 diastolic dysfunction as well as mild MR. Dyspnea was felt to be due to deconditioning and not CHF.  Since his last visit, he has been seen by multiple provider (including ED provider, ID, and ENT) for acute parotitis and has been treated with antibiotics. He was also admitted from 07/26/2020 to 07/31/2020 for this. He was last seen by ENT on 08/14/2020 at which time symptoms had improved but he was still having fullness of left parotid gland. Salivary precautions were recommended with plans to follow-up in 6 weeks.  Patient presents today for follow-up of shortness of breath. Here with daughter. Patient reports continued shortness of breath and states it has been increasing over the  last year. Occurs with minimal activity such as bending over or reaching to get something at the dinner table. No orthopnea, PND, or lower extremity edema. No weight gain (he has actually been losing weight). No palpitations. He has some mild lightheadedness/dizziness with position changes. No syncope.  Daughter expressed concern that during patient's recent admission, they were told by General Medicine team that he had CHF and atrial fibrillation. Reviewed prior notes from Dr. Martinique and there is no mention of a formal diagnosis of CHF. He was not treated for CHF exacerbation during hospitalization so unclear where this diagnosis came from. Explained that my best guess is that they assumed he had CHF based off grade 1 diastolic CHF on recent Echo and the fact that he takes Lasix. However,reassured daughter that I do not think his symptoms are consistent with CHF. Also did not see any mention of atrial fibrillation in any of the hospital notes or clear atrial fibrillation on EKG from admission. There is one EKG on 07/31/2020 with wide complex tachycardia (due to underlying LBBB) with rates in the 130s. P waves difficult to see but I do think they are there. Rhythm very regular so do not think this is atrial fibrillation. Clearly in sinus rhythm in office today.  Past Medical History:  Diagnosis Date   Alzheimer disease ([place])    Chronic kidney disease    mild insuffiency   Coronary artery disease    LHC 4/10: Mid LAD 40-50%, then 70%, OM1 20-30%, EF 65%. Mid LAD FFR 0.89 (not hemodynamically  significant). Medical therapy was continued.   Dysuria 08/10/2020   Hyperlipidemia    LBBB (left bundle branch block) 06/2018   Obstructive sleep apnea     Past Surgical History:  Procedure Laterality Date   CARDIAC CATHETERIZATION  05/02/2003   single vessel,moderate stenosis mid LAD   CARDIAC CATHETERIZATION  06/12/2008   continue med. therapy   right shoulder surgery     TOTAL HIP ARTHROPLASTY Left 09/14/2018    Procedure: LEFT TOTAL HIP ARTHROPLASTY ANTERIOR APPROACH;  Surgeon: Mcarthur Rossetti, MD;  Location: [facility];  Service: Orthopedics;  Laterality: Left;    Current Medications: Current Meds  Medication Sig   calcium-vitamin D (OSCAL WITH D) 500-200 MG-UNIT per tablet Take 1 tablet by mouth 2 (two) times daily.   docusate sodium (COLACE) 100 MG capsule Take 100 mg by mouth daily.   donepezil (ARICEPT) 10 MG tablet Take 10 mg by mouth at bedtime.   fesoterodine (TOVIAZ) 8 MG TB24 tablet Take 8 mg by mouth daily.   fluticasone (FLONASE) 50 MCG/ACT nasal spray Place 2 sprays into both nostrils daily as needed for allergies.   lisinopril (ZESTRIL) 20 MG tablet Take 20 mg by mouth daily.   mirabegron ER (MYRBETRIQ) 25 MG TB24 tablet Take 25 mg by mouth daily.    Misc Natural Products (OSTEO BI-FLEX/5-LOXIN ADVANCED) TABS    Multiple Vitamin (MULTIVITAMIN) tablet Take 1 tablet by mouth daily.   Omega-3 Fatty Acids (FISH OIL) 1000 MG CAPS Take 1 capsule (1,000 mg total) by mouth 2 (two) times daily.   Psyllium (METAMUCIL PO) Take 5 mLs by mouth daily. Mix with water or juice   rosuvastatin (CRESTOR) 10 MG tablet TAKE 1/2 TABLET BY MOUTH EVERY OTHER DAY (Patient taking differently: Take 5 mg by mouth every other day.)   [DISCONTINUED] aspirin 81 MG chewable tablet Chew 1 tablet (81 mg total) by mouth 2 (two) times daily.   [DISCONTINUED] furosemide (LASIX) 20 MG tablet Take 20 mg by mouth every other day.     Allergies:   Darifenacin, Enablex [darifenacin hydrobromide er], Ceftriaxone, Atorvastatin, Ezetimibe, Levaquin [levofloxacin], Other, Oxybutynin, Sulfamethoxazole, Sulfasalazine, and Sulfonamide derivatives   Social History   Socioeconomic History   Marital status: Widowed    Spouse name: Not on file   Number of children: 2   Years of education: Not on file   Highest education level: Not on file  Occupational History   Occupation: [employer]  Tobacco Use   Smoking  status: Former    Pack years: 0.00    Types: Pipe    Quit date: 02/28/1990    Years since quitting: 30.5   Smokeless tobacco: Never   Tobacco comments:    smoked pipe only   Vaping Use   Vaping Use: Never used  Substance and Sexual Activity   Alcohol use: No    Alcohol/week: 0.0 standard drinks    Comment: rarely wine   Drug use: No   Sexual activity: Not on file  Other Topics Concern   Not on file  Social History Narrative   Not on file   Social Determinants of Health   Financial Resource Strain: Not on file  Food Insecurity: Not on file  Transportation Needs: Not on file  Physical Activity: Not on file  Stress: Not on file  Social Connections: Not on file     Family History: The patient's family history includes Cancer in his mother; Hypertension in his father and mother; Other in his father.  ROS:  Please see the history of present illness.     EKGs/Labs/Other Studies Reviewed:    The following studies were reviewed today:  Cardiac Catheterization 06/12/2008: ANGIOGRAPHIC DATA:   The left coronary artery arises and distributes normally. The left main coronary artery is short and is normal.   The left anterior descending artery has mild-to-moderate calcification. There is diffuse 40-50% disease in the mid LAD.  Within this segment of disease, there is a more focal 70% eccentric stenosis.  The first diagonal is without significant disease.   Left circumflex coronary is a large dominant vessel.  There is 20-30% plaque in the first obtuse marginal vessel.  Otherwise, the left circumflex coronary artery appears normal.   The right coronary is a small nondominant vessel and appears normal.   Left ventricular angiography was performed in the RAO view. This demonstrates normal left ventricular size and contractility with normal systolic function.  Ejection fraction is estimated 65%.   Given the intermediate stenosis in the mid LAD and the patient's recent symptoms, we  proceeded with physiologic testing of the LAD stenosis using a FloWire.  After the patient was anticoagulated, we were able to cross the lesion in the LAD easily with the FloWire.  We obtained 3 sets to hemodynamic measurements following injection of 60 mcg of intracoronary adenosine.  This yielded fractional flow reserve measurements of 0.89 and 0.88.  These findings were felt to indicate that the lesion was non-flow limiting and would be best treated medically.  We subsequently performed angiogram of the right femoral artery.  His right groin was sealed using an Angio-Seal device with excellent hemostasis.   FINAL INTERPRETATION:  1. Single-vessel atherosclerotic coronary disease.  There is a moderate intermediate stenosis in the mid left anterior descending. Based on fractional flow reserve measurements, it is felt this is non-flow limiting.  2. Normal left ventricular function.   PLAN: Continue medical therapy. _______________  Myoview 07/20/2018: Nuclear stress EF: 63%. The left ventricular ejection fraction is normal (55-65%). There was no ST segment deviation noted during stress. The study is normal. This is a low risk study. _______________  Echocardiogram 05/22/2020: Impressions:  1. Left ventricular ejection fraction, by estimation, is 60 to 65%. The  left ventricle has normal function. The left ventricle has no regional  wall motion abnormalities. There is mild left ventricular hypertrophy.  Left ventricular diastolic parameters  are consistent with Grade I diastolic dysfunction (impaired relaxation).   2. Right ventricular systolic function is normal. The right ventricular  size is normal.   3. The mitral valve is normal in structure. Mild mitral valve  regurgitation. No evidence of mitral stenosis.   4. The aortic valve is tricuspid. Aortic valve regurgitation is trivial.  Mild aortic valve sclerosis is present, with no evidence of aortic valve  stenosis.   5. The inferior  vena cava is normal in size with greater than 50%  respiratory variability, suggesting right atrial pressure of 3 mmHg.   EKG:  EKG ordered today. EKG personally reviewed and demonstrates normal sinus rhythm, rate 82 bpm, with LBBB. No significant changes from prior tracing. Borderline 1st degree AV block. QTc 472 ms.  Recent Labs: 04/21/2020: NT-Pro BNP 100 07/26/2020: ALT 23 07/31/2020: BUN 17; Creatinine, Ser 0.98; Hemoglobin 11.0; Platelets 239; Potassium 3.8; Sodium 136  Recent Lipid Panel No results found for: CHOL, TRIG, HDL, CHOLHDL, VLDL, LDLCALC, LDLDIRECT  Physical Exam:    Vital Signs: BP 138/62 (BP Location: Right Arm, Patient Position: Sitting, Cuff Size: Large)  Pulse 82   Ht 5\' 10"  (1.778 m)   Wt 204 lb 3.2 oz (92.6 kg)   SpO2 96%   BMI 29.30 kg/m     Wt Readings from Last 3 Encounters:  09/07/20 204 lb 3.2 oz (92.6 kg)  08/10/20 204 lb (92.5 kg)  07/27/20 203 lb 14.8 oz (92.5 kg)     General: 80 y.o. Caucasian male in no acute distress. HEENT: Normocephalic and atraumatic. Sclera clear. EOMs intact. Neck: Supple. No carotid bruits. No JVD. Heart: RRR. Distinct S1 and S2. Soft systolic murmur noted. No gallops or rubs.  Lungs: No increased work of breathing. Clear to ausculation bilaterally. No wheezes, rhonchi, or rales.  Abdomen: Soft, obese, and non-tender to palpation.  Extremities: No lower extremity edema.    Skin: Warm and dry. Neuro: Alert and oriented x3. No focal deficits. Psych: Normal affect. Responds appropriately.  Assessment:    1. Dyspnea, unspecified type   2. Coronary artery disease involving native coronary artery of native heart without angina pectoris   3. Primary hypertension   4. Hyperlipidemia, unspecified hyperlipidemia type   5. Stage 3a chronic kidney disease ([place])   6. Alzheimer's dementia of other onset without behavioral disturbance ([place])     Plan:    Shortness of Breath - Patient continues to have shortness of breath with  minimal activity. - Echo in 04/2020  showed LVEF of 60-65% with normal wall motion, mild LVH, and grade 1 diastolic dysfunction as well as mild MR. Symptoms felt to be secondary to deconditioning and not CHF.  - Still do not feel like symptoms are consistent with CHF. No orthopnea, PND, edema, or weigh gain. Discussed that symptoms still may be due to deconditioning. Offered stress test to make sure dyspnea is not an anginal equivalent. Patient/daughter would like to try increasing activity and starting PT to see if that helps.  Nonobstructive CAD - Noted on cardiac catheterization in 2010. Myoview in 2020 was low risk with no evidence of ischemia.  - No angina.  - Continue aspirin and statin. Currently taking Aspiring 81mg  twice daily. Will decrease this to once daily.  Hypertension - BP well controlled. - Continue Lisinopril 20mg  daily. Also on Lasix 20mg  every day.  Hyperlipidemia - No recent lipid panel in our system or in [place]. - Patient currently on Crestor 5mg  every other day. Unclear why. Patient has been intolerant to Lipitor in the past but patient denies any myalgias with Crestor. - Continue Crestor 10mg  daily. - Patient not fasting today so have asked him to come back for fasting labs.  CKD Stage IIIa - Baseline creatinine around 1.0 to 1.30.  - Stable on most recent labs in 07/2020.   Alzheimer Disease - On Aricept.  - Followed by PCP.  Disposition: Follow up with Dr. Martinique in 10/2020.   Medication Adjustments/Labs and Tests Ordered: Current medicines are reviewed at length with the patient today.  Concerns regarding medicines are outlined above.  Orders Placed This Encounter  Procedures   Lipid panel   EKG 12-Lead    Meds ordered this encounter  Medications   aspirin 81 MG chewable tablet    Sig: Chew 1 tablet (81 mg total) by mouth daily.    Dispense:  35 tablet    Refill:  0    Order Specific Question:   Supervising Provider    Answer:   Oval Linsey, TIFFANY  [[id number]]   furosemide (LASIX) 20 MG tablet    Sig: Take 1 tablet (20 mg  total) by mouth daily.    Dispense:  30 tablet    Refill:  6    Order Specific Question:   Supervising Provider    Answer:   Skeet Latch [[id number]]     Patient Instructions  Medication Instructions:  No Changes *If you need a refill on your cardiac medications before your next appointment, please call your pharmacy*   Lab Work: No Labs If you have labs (blood work) drawn today and your tests are completely normal, you will receive your results only by: [place] (if you have MyChart) OR A paper copy in the mail If you have any lab test that is abnormal or we need to change your treatment, we will call you to review the results.   Testing/Procedures: No Testing   Follow-Up: At [facility], you and your health needs are our priority.  As part of our continuing mission to provide you with exceptional heart care, we have created designated Provider Care Teams.  These Care Teams include your primary Cardiologist (physician) and Advanced Practice Providers (APPs -  Physician Assistants and Nurse Practitioners) who all work together to provide you with the care you need, when you need it.  We recommend signing up for the patient portal called "MyChart".  Sign up information is provided on this After Visit Summary.  MyChart is used to connect with patients for Virtual Visits (Telemedicine).  Patients are able to view lab/test results, encounter notes, upcoming appointments, etc.  Non-urgent messages can be sent to your provider as well.   To learn more about what you can do with MyChart, go to [email or web address].    Your next appointment:   September 30, 20022 11AM  The format for your next appointment:   In Person  Provider:   Peter Martinique, MD      Signed, Darreld Mclean, PA-C  09/07/2020 5:05 PM    [place]

## 2020-09-01 ENCOUNTER — Encounter: Payer: Self-pay | Admitting: Student

## 2020-09-07 ENCOUNTER — Ambulatory Visit (INDEPENDENT_AMBULATORY_CARE_PROVIDER_SITE_OTHER): Payer: Medicare Other | Admitting: Student

## 2020-09-07 ENCOUNTER — Other Ambulatory Visit: Payer: Self-pay

## 2020-09-07 ENCOUNTER — Encounter: Payer: Self-pay | Admitting: Student

## 2020-09-07 VITALS — BP 138/62 | HR 82 | Ht 70.0 in | Wt 204.2 lb

## 2020-09-07 DIAGNOSIS — E785 Hyperlipidemia, unspecified: Secondary | ICD-10-CM

## 2020-09-07 DIAGNOSIS — I10 Essential (primary) hypertension: Secondary | ICD-10-CM | POA: Diagnosis not present

## 2020-09-07 DIAGNOSIS — R0600 Dyspnea, unspecified: Secondary | ICD-10-CM | POA: Diagnosis not present

## 2020-09-07 DIAGNOSIS — I2510 Atherosclerotic heart disease of native coronary artery without angina pectoris: Secondary | ICD-10-CM | POA: Diagnosis not present

## 2020-09-07 DIAGNOSIS — F0280 Dementia in other diseases classified elsewhere without behavioral disturbance: Secondary | ICD-10-CM

## 2020-09-07 DIAGNOSIS — G308 Other Alzheimer's disease: Secondary | ICD-10-CM

## 2020-09-07 DIAGNOSIS — N1831 Chronic kidney disease, stage 3a: Secondary | ICD-10-CM

## 2020-09-07 MED ORDER — FUROSEMIDE 20 MG PO TABS: 20.0000 mg | ORAL_TABLET | Freq: Every day | ORAL | 6 refills | Status: DC

## 2020-09-07 MED ORDER — ASPIRIN 81 MG PO CHEW: 81.0000 mg | CHEWABLE_TABLET | Freq: Every day | ORAL | 0 refills | Status: DC

## 2020-09-07 NOTE — Patient Instructions (Signed)
Medication Instructions:  No Changes *If you need a refill on your cardiac medications before your next appointment, please call your pharmacy*   Lab Work: No Labs If you have labs (blood work) drawn today and your tests are completely normal, you will receive your results only by: [place] (if you have MyChart) OR A paper copy in the mail If you have any lab test that is abnormal or we need to change your treatment, we will call you to review the results.   Testing/Procedures: No Testing   Follow-Up: At [facility], you and your health needs are our priority.  As part of our continuing mission to provide you with exceptional heart care, we have created designated Provider Care Teams.  These Care Teams include your primary Cardiologist (physician) and Advanced Practice Providers (APPs -  Physician Assistants and Nurse Practitioners) who all work together to provide you with the care you need, when you need it.  We recommend signing up for the patient portal called "MyChart".  Sign up information is provided on this After Visit Summary.  MyChart is used to connect with patients for Virtual Visits (Telemedicine).  Patients are able to view lab/test results, encounter notes, upcoming appointments, etc.  Non-urgent messages can be sent to your provider as well.   To learn more about what you can do with MyChart, go to [email or web address].    Your next appointment:   September 30, 20022 11AM  The format for your next appointment:   In Person  Provider:   Peter Martinique, MD

## 2020-09-08 ENCOUNTER — Telehealth: Payer: Self-pay

## 2020-09-11 ENCOUNTER — Emergency Department (HOSPITAL_COMMUNITY): Payer: Medicare Other

## 2020-09-11 ENCOUNTER — Other Ambulatory Visit: Payer: Self-pay

## 2020-09-11 ENCOUNTER — Encounter (HOSPITAL_COMMUNITY): Payer: Self-pay | Admitting: Internal Medicine

## 2020-09-11 ENCOUNTER — Inpatient Hospital Stay (HOSPITAL_COMMUNITY)
Admission: EM | Admit: 2020-09-11 | Discharge: 2020-09-13 | DRG: 871 | Disposition: A | Discharge status: Home / Self Care | Payer: Medicare Other | Attending: Internal Medicine | Admitting: Internal Medicine

## 2020-09-11 DIAGNOSIS — N390 Urinary tract infection, site not specified: Secondary | ICD-10-CM | POA: Diagnosis not present

## 2020-09-11 DIAGNOSIS — G4733 Obstructive sleep apnea (adult) (pediatric): Secondary | ICD-10-CM | POA: Diagnosis present

## 2020-09-11 DIAGNOSIS — B961 Klebsiella pneumoniae [K. pneumoniae] as the cause of diseases classified elsewhere: Secondary | ICD-10-CM | POA: Diagnosis present

## 2020-09-11 DIAGNOSIS — Z882 Allergy status to sulfonamides status: Secondary | ICD-10-CM

## 2020-09-11 DIAGNOSIS — Z888 Allergy status to other drugs, medicaments and biological substances status: Secondary | ICD-10-CM

## 2020-09-11 DIAGNOSIS — I5032 Chronic diastolic (congestive) heart failure: Secondary | ICD-10-CM | POA: Diagnosis present

## 2020-09-11 DIAGNOSIS — N400 Enlarged prostate without lower urinary tract symptoms: Secondary | ICD-10-CM | POA: Diagnosis present

## 2020-09-11 DIAGNOSIS — A419 Sepsis, unspecified organism: Secondary | ICD-10-CM | POA: Diagnosis present

## 2020-09-11 DIAGNOSIS — A4159 Other Gram-negative sepsis: Secondary | ICD-10-CM | POA: Diagnosis not present

## 2020-09-11 DIAGNOSIS — I2510 Atherosclerotic heart disease of native coronary artery without angina pectoris: Secondary | ICD-10-CM | POA: Diagnosis present

## 2020-09-11 DIAGNOSIS — N1831 Chronic kidney disease, stage 3a: Secondary | ICD-10-CM | POA: Diagnosis present

## 2020-09-11 DIAGNOSIS — Z20822 Contact with and (suspected) exposure to covid-19: Secondary | ICD-10-CM | POA: Diagnosis present

## 2020-09-11 DIAGNOSIS — G9341 Metabolic encephalopathy: Secondary | ICD-10-CM | POA: Diagnosis present

## 2020-09-11 DIAGNOSIS — Z9989 Dependence on other enabling machines and devices: Secondary | ICD-10-CM | POA: Diagnosis present

## 2020-09-11 DIAGNOSIS — I447 Left bundle-branch block, unspecified: Secondary | ICD-10-CM | POA: Diagnosis present

## 2020-09-11 DIAGNOSIS — F0280 Dementia in other diseases classified elsewhere without behavioral disturbance: Secondary | ICD-10-CM | POA: Diagnosis present

## 2020-09-11 DIAGNOSIS — Z87891 Personal history of nicotine dependence: Secondary | ICD-10-CM

## 2020-09-11 DIAGNOSIS — Z87440 Personal history of urinary (tract) infections: Secondary | ICD-10-CM

## 2020-09-11 DIAGNOSIS — G309 Alzheimer's disease, unspecified: Secondary | ICD-10-CM | POA: Diagnosis present

## 2020-09-11 DIAGNOSIS — I130 Hypertensive heart and chronic kidney disease with heart failure and stage 1 through stage 4 chronic kidney disease, or unspecified chronic kidney disease: Secondary | ICD-10-CM | POA: Diagnosis present

## 2020-09-11 DIAGNOSIS — Z7982 Long term (current) use of aspirin: Secondary | ICD-10-CM

## 2020-09-11 DIAGNOSIS — R410 Disorientation, unspecified: Secondary | ICD-10-CM

## 2020-09-11 DIAGNOSIS — Z79899 Other long term (current) drug therapy: Secondary | ICD-10-CM

## 2020-09-11 DIAGNOSIS — I10 Essential (primary) hypertension: Secondary | ICD-10-CM | POA: Diagnosis present

## 2020-09-11 DIAGNOSIS — E785 Hyperlipidemia, unspecified: Secondary | ICD-10-CM | POA: Diagnosis present

## 2020-09-11 DIAGNOSIS — Z96642 Presence of left artificial hip joint: Secondary | ICD-10-CM | POA: Diagnosis present

## 2020-09-11 DIAGNOSIS — Z881 Allergy status to other antibiotic agents status: Secondary | ICD-10-CM

## 2020-09-11 DIAGNOSIS — Z8249 Family history of ischemic heart disease and other diseases of the circulatory system: Secondary | ICD-10-CM

## 2020-09-11 LAB — COMPREHENSIVE METABOLIC PANEL
ALT: 21 U/L (ref 0–44)
AST: 25 U/L (ref 15–41)
Albumin: 3.5 g/dL (ref 3.5–5.0)
Alkaline Phosphatase: 52 U/L (ref 38–126)
Anion gap: 12 (ref 5–15)
BUN: 21 mg/dL (ref 8–23)
CO2: 19 mmol/L — ABNORMAL LOW (ref 22–32)
Calcium: 9.6 mg/dL (ref 8.9–10.3)
Chloride: 103 mmol/L (ref 98–111)
Creatinine, Ser: 1.34 mg/dL — ABNORMAL HIGH (ref 0.61–1.24)
GFR, Estimated: 53 mL/min — ABNORMAL LOW (ref >60)
Glucose, Bld: 203 mg/dL — ABNORMAL HIGH (ref 70–99)
Potassium: 3.7 mmol/L (ref 3.5–5.1)
Sodium: 134 mmol/L — ABNORMAL LOW (ref 135–145)
Total Bilirubin: 1.5 mg/dL — ABNORMAL HIGH (ref 0.3–1.2)
Total Protein: 6.7 g/dL (ref 6.5–8.1)

## 2020-09-11 LAB — CBC WITH DIFFERENTIAL/PLATELET
Abs Immature Granulocytes: 0.09 10*3/uL — ABNORMAL HIGH (ref 0.00–0.07)
Basophils Absolute: 0 10*3/uL (ref 0.0–0.1)
Basophils Relative: 0 %
Eosinophils Absolute: 0 10*3/uL (ref 0.0–0.5)
Eosinophils Relative: 0 %
HCT: 41.6 % (ref 39.0–52.0)
Hemoglobin: 13.7 g/dL (ref 13.0–17.0)
Immature Granulocytes: 1 %
Lymphocytes Relative: 6 %
Lymphs Abs: 1 10*3/uL (ref 0.7–4.0)
MCH: 31.1 pg (ref 26.0–34.0)
MCHC: 32.9 g/dL (ref 30.0–36.0)
MCV: 94.3 fL (ref 80.0–100.0)
Monocytes Absolute: 1.1 10*3/uL — ABNORMAL HIGH (ref 0.1–1.0)
Monocytes Relative: 7 %
Neutro Abs: 13 10*3/uL — ABNORMAL HIGH (ref 1.7–7.7)
Neutrophils Relative %: 86 %
Platelets: 227 10*3/uL (ref 150–400)
RBC: 4.41 MIL/uL (ref 4.22–5.81)
RDW: 13.6 % (ref 11.5–15.5)
WBC: 15.1 10*3/uL — ABNORMAL HIGH (ref 4.0–10.5)
nRBC: 0 % (ref 0.0–0.2)

## 2020-09-11 LAB — BRAIN NATRIURETIC PEPTIDE: B Natriuretic Peptide: 131 pg/mL — ABNORMAL HIGH (ref 0.0–100.0)

## 2020-09-11 LAB — RESP PANEL BY RT-PCR (FLU A&B, COVID) ARPGX2
Influenza A by PCR: NEGATIVE
Influenza B by PCR: NEGATIVE
SARS Coronavirus 2 by RT PCR: NEGATIVE

## 2020-09-11 LAB — URINALYSIS, MICROSCOPIC (REFLEX): WBC, UA: >50 WBC/hpf (ref 0–5)

## 2020-09-11 LAB — URINALYSIS, ROUTINE W REFLEX MICROSCOPIC
Glucose, UA: NEGATIVE mg/dL
Ketones, ur: NEGATIVE mg/dL
Nitrite: NEGATIVE
Protein, ur: NEGATIVE mg/dL
Specific Gravity, Urine: 1.02 (ref 1.005–1.030)
pH: 5.5 (ref 5.0–8.0)

## 2020-09-11 LAB — AMMONIA: Ammonia: 17 umol/L (ref 9–35)

## 2020-09-11 LAB — LACTIC ACID, PLASMA
Lactic Acid, Venous: 3.9 mmol/L (ref 0.5–1.9)
Lactic Acid, Venous: 2.1 mmol/L (ref 0.5–1.9)
Lactic Acid, Venous: 2.9 mmol/L (ref 0.5–1.9)
Lactic Acid, Venous: 1.7 mmol/L (ref 0.5–1.9)

## 2020-09-11 LAB — TROPONIN I (HIGH SENSITIVITY)
Troponin I (High Sensitivity): 21 ng/L — ABNORMAL HIGH (ref <18)
Troponin I (High Sensitivity): 26 ng/L — ABNORMAL HIGH (ref <18)

## 2020-09-11 LAB — PROCALCITONIN: Procalcitonin: 0.47 ng/mL

## 2020-09-11 LAB — LIPASE, BLOOD: Lipase: 20 U/L (ref 11–51)

## 2020-09-11 MED ORDER — POLYETHYLENE GLYCOL 3350 17 G PO PACK: 17.0000 g | PACK | Freq: Every day | ORAL | Status: DC | PRN

## 2020-09-11 MED ORDER — DONEPEZIL HCL 10 MG PO TABS
10.0000 mg | ORAL_TABLET | Freq: Every day | ORAL | Status: DC
  Administered 2020-09-11 – 2020-09-12 (×2): 10 mg via ORAL
  Filled 2020-09-11 (×2): qty 1

## 2020-09-11 MED ORDER — PIPERACILLIN-TAZOBACTAM 3.375 G IVPB 30 MIN
3.3750 g | Freq: Once | INTRAVENOUS | Status: AC
  Administered 2020-09-11: 3.375 g via INTRAVENOUS
  Filled 2020-09-11: qty 50

## 2020-09-11 MED ORDER — FESOTERODINE FUMARATE ER 8 MG PO TB24
8.0000 mg | ORAL_TABLET | Freq: Every day | ORAL | Status: DC
  Administered 2020-09-12 – 2020-09-13 (×2): 8 mg via ORAL
  Filled 2020-09-11 (×2): qty 1

## 2020-09-11 MED ORDER — SODIUM CHLORIDE 0.9% FLUSH
3.0000 mL | Freq: Two times a day (BID) | INTRAVENOUS | Status: DC
  Administered 2020-09-11 – 2020-09-13 (×5): 3 mL via INTRAVENOUS

## 2020-09-11 MED ORDER — ASPIRIN 81 MG PO CHEW
81.0000 mg | CHEWABLE_TABLET | Freq: Two times a day (BID) | ORAL | Status: DC
  Administered 2020-09-11 – 2020-09-13 (×4): 81 mg via ORAL
  Filled 2020-09-11 (×4): qty 1

## 2020-09-11 MED ORDER — PIPERACILLIN-TAZOBACTAM 3.375 G IVPB
3.3750 g | Freq: Three times a day (TID) | INTRAVENOUS | Status: DC
  Administered 2020-09-11 – 2020-09-13 (×6): 3.375 g via INTRAVENOUS
  Filled 2020-09-11 (×8): qty 50

## 2020-09-11 MED ORDER — ONDANSETRON HCL 4 MG/2ML IJ SOLN: 4.0000 mg | Freq: Four times a day (QID) | INTRAMUSCULAR | Status: DC | PRN

## 2020-09-11 MED ORDER — HYDRALAZINE HCL 20 MG/ML IJ SOLN: 5.0000 mg | INTRAMUSCULAR | Status: DC | PRN

## 2020-09-11 MED ORDER — DOCUSATE SODIUM 100 MG PO CAPS
200.0000 mg | ORAL_CAPSULE | Freq: Two times a day (BID) | ORAL | Status: DC
  Administered 2020-09-11 – 2020-09-13 (×4): 200 mg via ORAL
  Filled 2020-09-11 (×4): qty 2

## 2020-09-11 MED ORDER — ONDANSETRON HCL 4 MG PO TABS: 4.0000 mg | ORAL_TABLET | Freq: Four times a day (QID) | ORAL | Status: DC | PRN

## 2020-09-11 MED ORDER — ROSUVASTATIN CALCIUM 5 MG PO TABS
5.0000 mg | ORAL_TABLET | ORAL | Status: DC
  Administered 2020-09-12: 5 mg via ORAL
  Filled 2020-09-11: qty 1

## 2020-09-11 MED ORDER — MIRABEGRON ER 25 MG PO TB24
25.0000 mg | ORAL_TABLET | Freq: Every day | ORAL | Status: DC
  Administered 2020-09-12 – 2020-09-13 (×2): 25 mg via ORAL
  Filled 2020-09-11 (×2): qty 1

## 2020-09-11 MED ORDER — ENOXAPARIN SODIUM 40 MG/0.4ML IJ SOSY
40.0000 mg | PREFILLED_SYRINGE | INTRAMUSCULAR | Status: DC
  Administered 2020-09-11 – 2020-09-13 (×3): 40 mg via SUBCUTANEOUS
  Filled 2020-09-11 (×3): qty 0.4

## 2020-09-11 MED ORDER — MORPHINE SULFATE (PF) 2 MG/ML IV SOLN: 2.0000 mg | INTRAVENOUS | Status: DC | PRN

## 2020-09-11 MED ORDER — BISACODYL 5 MG PO TBEC: 5.0000 mg | DELAYED_RELEASE_TABLET | Freq: Every day | ORAL | Status: DC | PRN

## 2020-09-11 MED ORDER — LACTATED RINGERS IV SOLN: INTRAVENOUS | Status: DC

## 2020-09-11 MED ORDER — ACETAMINOPHEN 650 MG RE SUPP: 650.0000 mg | Freq: Four times a day (QID) | RECTAL | Status: DC | PRN

## 2020-09-11 MED ORDER — ACETAMINOPHEN 325 MG PO TABS
650.0000 mg | ORAL_TABLET | Freq: Four times a day (QID) | ORAL | Status: DC | PRN
  Administered 2020-09-11: 650 mg via ORAL
  Filled 2020-09-11: qty 2

## 2020-09-11 NOTE — ED Notes (Signed)
Transport taking patient to the floor at this time.

## 2020-09-11 NOTE — ED Provider Notes (Signed)
[facility] EMERGENCY DEPARTMENT Provider Note   CSN: [id number] Arrival date & time: 09/11/20  0755     History Chief Complaint  Patient presents with   Altered Mental Status    [name] [name] is a 80 y.o. male.  The history is provided by the patient and medical records. The history is limited by the condition of the patient and the absence of a caregiver. No language interpreter was used.  Altered Mental Status Presenting symptoms: confusion   Severity:  Moderate Most recent episode:  Yesterday Episode history:  Continuous Duration:  2 days Timing:  Constant Progression:  Unchanged Associated symptoms: fever   Associated symptoms: no abdominal pain, no agitation, no difficulty breathing, no headaches, no light-headedness, no nausea, no palpitations, no rash, no seizures, no vomiting and no weakness       Past Medical History:  Diagnosis Date   Alzheimer disease ([place])    Chronic kidney disease    mild insuffiency   Coronary artery disease    LHC 4/10: Mid LAD 40-50%, then 70%, OM1 20-30%, EF 65%. Mid LAD FFR 0.89 (not hemodynamically significant). Medical therapy was continued.   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   Unsteadiness 01/18/2016   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   Cellulitis of left lower leg    Left leg swelling    Cellulitis 08/23/2014    Chronic diastolic CHF (congestive heart failure) ([place]) 04/29/2014   DOE (dyspnea on exertion) 11/12/2011   Stage 3a chronic kidney disease (HCC)    Thrombocytopenia, unspecified ([place]) 07/29/2010   Vitamin D deficiency 05/19/2010   Depression, major, recurrent, 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   Urinary urgency 05/19/2010   Vertigo, 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   Benign essential hypertension 12/23/2009   Actinic keratosis 12/23/2009   Seborrheic keratosis 03/18/2009   Benign neoplasm 03/18/2009   Dyslipidemia 01/18/2008   Obstructive sleep apnea 01/18/2008   Coronary atherosclerosis 01/18/2008   Atherosclerotic heart disease of native coronary artery without angina pectoris 01/18/2008   Cataract in degenerative disorder 11/21/2006   Unspecified protein-calorie malnutrition ([place]) 09/27/2006   Gastro-esophageal reflux disease with esophagitis 09/15/2006   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 therapy   right shoulder surgery     TOTAL HIP ARTHROPLASTY Left 09/14/2018   Procedure: LEFT TOTAL HIP ARTHROPLASTY ANTERIOR APPROACH;  Surgeon: Mcarthur Rossetti, MD;  Location: [facility];  Service: Orthopedics;  Laterality: Left;       Family History  Problem Relation Age of Onset   Cancer Mother    Hypertension Mother    Other Father        old  age   Hypertension Father     Social History   Tobacco Use   Smoking status: Former    Types: Pipe    Quit date: 02/28/1990    Years since quitting: 30.5   Smokeless tobacco: Never   Tobacco comments:    smoked pipe only   Vaping Use   Vaping Use: Never used  Substance Use Topics   Alcohol use: No    Alcohol/week: 0.0 standard drinks     Comment: rarely wine   Drug use: No    Home Medications Prior to Admission medications   Medication Sig Start Date End Date Taking? Authorizing Provider  aspirin 81 MG chewable tablet Chew 1 tablet (81 mg total) by mouth daily. 09/07/20   Sande Rives E, PA-C  calcium-vitamin D (OSCAL WITH D) 500-200 MG-UNIT per tablet Take 1 tablet by mouth 2 (two) times daily.    [provider]  docusate sodium (COLACE) 100 MG capsule Take 100 mg by mouth daily.    [provider]  donepezil (ARICEPT) 10 MG tablet Take 10 mg by mouth at bedtime. 12/01/16   [provider]  fesoterodine (TOVIAZ) 8 MG TB24 tablet Take 8 mg by mouth daily. 12/13/18   [provider]  fluticasone (FLONASE) 50 MCG/ACT nasal spray Place 2 sprays into both nostrils daily as needed for allergies. 05/15/17   [provider]  furosemide (LASIX) 20 MG tablet Take 1 tablet (20 mg total) by mouth daily. 09/07/20   Sande Rives E, PA-C  lisinopril (ZESTRIL) 20 MG tablet Take 20 mg by mouth daily. 08/23/18   [provider]  mirabegron ER (MYRBETRIQ) 25 MG TB24 tablet Take 25 mg by mouth daily.  08/29/18   [provider]  Misc Natural Products (OSTEO BI-FLEX/5-LOXIN ADVANCED) TABS     [provider]  Multiple Vitamin (MULTIVITAMIN) tablet Take 1 tablet by mouth daily.    [provider]  Omega-3 Fatty Acids (FISH OIL) 1000 MG CAPS Take 1 capsule (1,000 mg total) by mouth 2 (two) times daily. 07/31/20   Little Ishikawa, MD  Psyllium (METAMUCIL PO) Take 5 mLs by mouth daily. Mix with water or juice    [provider]  rosuvastatin (CRESTOR) 10 MG tablet TAKE 1/2 TABLET BY MOUTH EVERY OTHER DAY Patient taking differently: Take 5 mg by mouth every other day. 06/22/20   [place], Peter M, MD    Allergies    Darifenacin, Enablex [darifenacin hydrobromide er], Ceftriaxone, Atorvastatin, Ezetimibe, Levaquin [levofloxacin], Other, Oxybutynin,  Sulfamethoxazole, Sulfasalazine, and Sulfonamide derivatives  Review of Systems   Review of Systems  Constitutional:  Positive for chills, fatigue and fever. Negative for diaphoresis.  HENT:  Negative for congestion.   Eyes:  Negative for visual disturbance.  Respiratory:  Negative for cough, chest tightness and shortness of breath.   Cardiovascular:  Negative for chest pain, palpitations and leg swelling.  Gastrointestinal:  Negative for abdominal pain, diarrhea, nausea and vomiting.  Genitourinary:  Positive for decreased urine volume. Negative for flank pain.  Musculoskeletal:  Negative for back pain, neck pain and neck stiffness.  Skin:  Negative for rash and wound.  Neurological:  Negative for seizures, weakness, light-headedness and headaches.  Psychiatric/Behavioral:  Positive for confusion. Negative for agitation.   All other systems reviewed and are negative.  Physical Exam Updated Vital Signs BP (!) 120/53   Pulse 78   Temp 99 F (37.2 C) (Oral)   Resp (!) 23   Ht 5\' 10"  (1.778  m)   Wt 92.6 kg   SpO2 96%   BMI 29.29 kg/m   Physical Exam Vitals and nursing note reviewed.  Constitutional:      General: He is not in acute distress.    Appearance: He is well-developed. He is not ill-appearing, toxic-appearing or diaphoretic.  HENT:     Head: Normocephalic and atraumatic.     Nose: No congestion or rhinorrhea.     Mouth/Throat:     Mouth: Mucous membranes are dry.     Pharynx: No oropharyngeal exudate or posterior oropharyngeal erythema.  Eyes:     Extraocular Movements: Extraocular movements intact.     Conjunctiva/sclera: Conjunctivae normal.     Pupils: Pupils are equal, round, and reactive to light.  Cardiovascular:     Rate and Rhythm: Normal rate and regular rhythm.     Heart sounds: No murmur heard. Pulmonary:     Effort: Pulmonary effort is normal. No respiratory distress.     Breath sounds: Rhonchi present. No wheezing or rales.  Chest:     Chest  wall: No tenderness.  Abdominal:     General: Abdomen is flat. There is no distension.     Palpations: Abdomen is soft.     Tenderness: There is no abdominal tenderness. There is no right CVA tenderness, left CVA tenderness, guarding or rebound.  Musculoskeletal:        General: No tenderness.     Cervical back: Neck supple. No tenderness.  Skin:    General: Skin is warm and dry.     Capillary Refill: Capillary refill takes less than 2 seconds.     Findings: No erythema or rash.  Neurological:     Mental Status: He is alert. He is disoriented.     Sensory: No sensory deficit.     Motor: No weakness.  Psychiatric:        Mood and Affect: Mood normal.    ED Results / Procedures / Treatments   Labs (all labs ordered are listed, but only abnormal results are displayed) Labs Reviewed  CBC WITH DIFFERENTIAL/PLATELET - Abnormal; Notable for the following components:      Result Value   WBC 15.1 (*)    Neutro Abs 13.0 (*)    Monocytes Absolute 1.1 (*)    Abs Immature Granulocytes 0.09 (*)    All other components within normal limits  COMPREHENSIVE METABOLIC PANEL - Abnormal; Notable for the following components:   Sodium 134 (*)    CO2 19 (*)    Glucose, Bld 203 (*)    Creatinine, Ser 1.34 (*)    Total Bilirubin 1.5 (*)    GFR, Estimated 53 (*)    All other components within normal limits  LACTIC ACID, PLASMA - Abnormal; Notable for the following components:   Lactic Acid, Venous 3.9 (*)    All other components within normal limits  LACTIC ACID, PLASMA - Abnormal; Notable for the following components:   Lactic Acid, Venous 2.1 (*)    All other components within normal limits  URINALYSIS, ROUTINE W REFLEX MICROSCOPIC - Abnormal; Notable for the following components:   Color, Urine AMBER (*)    APPearance CLOUDY (*)    Hgb urine dipstick SMALL (*)    Bilirubin Urine SMALL (*)    Leukocytes,Ua SMALL (*)    All other components within normal limits  BRAIN NATRIURETIC PEPTIDE -  Abnormal; Notable for the following components:   B Natriuretic Peptide 131.0 (*)    All other  components within normal limits  URINALYSIS, MICROSCOPIC (REFLEX) - Abnormal; Notable for the following components:   Bacteria, UA MANY (*)    All other components within normal limits  TROPONIN I (HIGH SENSITIVITY) - Abnormal; Notable for the following components:   Troponin I (High Sensitivity) 21 (*)    All other components within normal limits  TROPONIN I (HIGH SENSITIVITY) - Abnormal; Notable for the following components:   Troponin I (High Sensitivity) 26 (*)    All other components within normal limits  RESP PANEL BY RT-PCR (FLU A&B, COVID) [id number]  URINE CULTURE  CULTURE, BLOOD (ROUTINE X 2)  CULTURE, BLOOD (ROUTINE X 2)  LIPASE, BLOOD  AMMONIA    EKG EKG Interpretation  Date/Time:  Friday September 11 2020 07:57:37 EDT Ventricular Rate:  84 PR Interval:  209 QRS Duration: 151 QT Interval:  386 QTC Calculation: 457 R Axis:   -14 Text Interpretation: Sinus rhythm Left bundle branch block When compared to prior, slower rate. No STEMI Confirmed by Antony Blackbird [phone]) on 09/11/2020 7:59:47 AM  Radiology CT Head Wo Contrast  Result Date: 09/11/2020 CLINICAL DATA:  Mental status change. EXAM: CT HEAD WITHOUT CONTRAST TECHNIQUE: Contiguous axial images were obtained from the base of the skull through the vertex without intravenous contrast. COMPARISON:  CT maxillofacial Jul 26, 2020. MRI head June 09, 2019. Fall FINDINGS: Brain: No evidence of acute large vascular territory infarction, hemorrhage, hydrocephalus, extra-axial collection or mass lesion/mass effect. Moderate patchy white matter hypoattenuation, most likely related to chronic microvascular ischemic disease. Moderate atrophy with ex vacuo ventricular dilation. Vascular: Calcific atherosclerosis. No definite hyperdense vessel identified. Mildly hyperdense appearance of the right MCA is favored to relate to streak artifact in this  region. Skull: No acute fracture. Sinuses/Orbits: Moderate ethmoid air cell mucosal thickening. Frothy secretions in a posterior left ethmoid air cell. No acute orbital findings. Other: No mastoid effusions. IMPRESSION: 1. No evidence of acute intracranial abnormality. 2. 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 3. Moderate ethmoid air cell mucosal thickening. Electronically Signed   By: Margaretha Sheffield MD   On: 09/11/2020 09:16   DG Chest Portable 1 View  Result Date: 09/11/2020 CLINICAL DATA:  80 year old male with history of fever and fatigue. Possible sepsis. EXAM: PORTABLE CHEST 1 VIEW COMPARISON:  Chest x-ray 07/26/2020. FINDINGS: Lung volumes are slightly low. No consolidative airspace disease. No pleural effusions. No pneumothorax. No definite suspicious appearing pulmonary nodules or masses are noted. No evidence of pulmonary edema. Mild cardiomegaly. Upper mediastinal contours are within normal limits. Atherosclerotic calcifications in the aortic arch. Tortuosity of the descending thoracic aorta. IMPRESSION: 1. No radiographic evidence of acute cardiopulmonary disease. 2. Mild cardiomegaly. 3. Aortic atherosclerosis. Electronically Signed   By: Vinnie Langton M.D.   On: 09/11/2020 08:35    Procedures Procedures   CRITICAL CARE Performed by: Gwenyth Allegra Nichele Slawson Total critical care time: 35 minutes Critical care time was exclusive of separately billable procedures and treating other patients. Critical care was necessary to treat or prevent imminent or life-threatening deterioration. Critical care was time spent personally by me on the following activities: development of treatment plan with patient and/or surrogate as well as nursing, discussions with consultants, evaluation of patient's response to treatment, examination of patient, obtaining history from patient or surrogate, ordering and performing treatments and interventions, ordering and review of laboratory  studies, ordering and review of radiographic studies, pulse oximetry and re-evaluation of patient's condition.   Medications Ordered in ED Medications  piperacillin-tazobactam (ZOSYN) IVPB 3.375  g (has no administration in time range)  aspirin chewable tablet 81 mg (has no administration in time range)  rosuvastatin (CRESTOR) tablet 5 mg (has no administration in time range)  donepezil (ARICEPT) tablet 10 mg (has no administration in time range)  docusate sodium (COLACE) capsule 200 mg (has no administration in time range)  fesoterodine (TOVIAZ) tablet 8 mg (has no administration in time range)  mirabegron ER (MYRBETRIQ) tablet 25 mg (has no administration in time range)  enoxaparin (LOVENOX) injection 40 mg (has no administration in time range)  sodium chloride flush (NS) 0.9 % injection 3 mL (has no administration in time range)  lactated ringers infusion (has no administration in time range)  acetaminophen (TYLENOL) tablet 650 mg (has no administration in time range)    Or  acetaminophen (TYLENOL) suppository 650 mg (has no administration in time range)  morphine 2 MG/ML injection 2 mg (has no administration in time range)  polyethylene glycol (MIRALAX / GLYCOLAX) packet 17 g (has no administration in time range)  bisacodyl (DULCOLAX) EC tablet 5 mg (has no administration in time range)  ondansetron (ZOFRAN) tablet 4 mg (has no administration in time range)    Or  ondansetron (ZOFRAN) injection 4 mg (has no administration in time range)  hydrALAZINE (APRESOLINE) injection 5 mg (has no administration in time range)  piperacillin-tazobactam (ZOSYN) IVPB 3.375 g (0 g Intravenous Stopped 09/11/20 1127)    ED Course  I have reviewed the triage vital signs and the nursing notes.  Pertinent labs & imaging results that were available during my care of the patient were reviewed by me and considered in my medical decision making (see chart for details).    MDM Rules/Calculators/A&P                           UPTON RUSSEY is a 80 y.o. male with a past medical history significant for CKD, Alzheimer's dementia, dyslipidemia, CAD, hypertension, vertigo, known left bundle branch block, sleep apnea, frequent urinary tract infections, and conflicting documentation around CHF but currently on diuretics who presents with fever, fatigue, decreased urination, and some confusion.  According to EMS report to nursing from family, patient is been confused for the last 24 hours or so and they are concerned he may have a UTI again.  He had a temperature of 101.5 on scene and patient was given Tylenol and some fluids.  Patient does say that he had decreased urination but denies dysuria.  He denies any headache, neck pain, neck stiffness.  Denies any chest pain, palpitations, shortness of breath, or abdominal pain.  Denies nausea, vomiting, constipation, or diarrhea.  Denies rashes.  Denies tick exposures.  Denies recent medication changes.  He otherwise has no complaints aside from fatigue.  On exam, patient's lungs have some coarseness but chest and abdomen are nontender.  Bowel sounds appreciated.  No acute rashes seen initially.  No focal neurologic deficits initially.  Patient is resting comfortably.  Given his fever and report of recurrent UTIs and his urinary symptoms of decreased urination, primarily suspect UTI however we will get urinalysis, chest x-ray given the rhonchi on exam with his tachypnea on arrival, and we will also get screening labs.  We will get a head CT given the reported confusion although with his lack of any headache or neck pain or neck stiffness, low suspicion for meningitis at this time.  Anticipate reassessment to determine disposition after work-up is completed.  10:32 AM  Family was able to arrive to the bedside and contributed further information.  She does say that he has been much more tired over the last 24 hours and she is concerned about his urine given his history of  UTIs.  She does say that it took him a long time to walk from getting the mail yesterday which is abnormal for him and he was more winded.   Work-up began to return and patient does have a leukocytosis of 15.1, there is a lactic acid of 3.9 which is concerning, and creatinine is more elevated than prior.  His urinalysis does show leukocytes and bacteria and given his symptoms, fever at home, and overall appearance, I am concerned that he has a mild encephalopathy related to recurrent urinary tract infection.  Will order antibiotics and family agrees that she does not feel he safe for discharge home given his worsening symptoms over the last day.  Will call for admission for further management.    Final Clinical Impression(s) / ED Diagnoses Final diagnoses:  Lower urinary tract infectious disease  Confusion    Clinical Impression: 1. Lower urinary tract infectious disease   2. Confusion     Disposition: Admit  This note was prepared with assistance of Dragon voice recognition software. Occasional wrong-word or sound-a-like substitutions may have occurred due to the inherent limitations of voice recognition software.     Swati Granberry, Gwenyth Allegra, MD 09/11/20 [phone]

## 2020-09-11 NOTE — ED Triage Notes (Signed)
BIB EMS from home. Family care takers called 911 for increased confusion over the last 24 hours. Patient has a history of chronic UTIs in the past. EMS had a temp of 101.5 on scene gave 1 gram of tylenol and 532ml of normal saline. Temp now 99.8

## 2020-09-11 NOTE — ED Notes (Signed)
Vital signs stable. 

## 2020-09-11 NOTE — ED Notes (Signed)
Back from CT

## 2020-09-11 NOTE — ED Notes (Signed)
Family updated as to patient's status.

## 2020-09-11 NOTE — H&P (Signed)
History and Physical    [name] [name] HQI:[id number] DOB: [birth year] DOA: 09/11/2020  PCP: Chesley Noon, MD Consultants:  Marlou Sa - orthopedics; [place] - cardiolgy; Wert - pulmonology Patient coming from: Home - lives with daughter and her family; NOK: Daughter, Aryon Nham, [phone]  Chief Complaint: Increased confusion  HPI: [name] [name] is a 80 y.o. male with medical history significant of OSA; HLD; CAD; chronic diastolic CHF; and dementia with recent hospitalization for refractory parotitis.  He is presenting today with increased confusion.  His daughter reports that she is very concerned about his overall health right now, knows that he needs to be admitted.  She reports that had a recent stomach bug Tues-Wed, she was concerned he was getting that last night.  He felt cold after arriving home from a meeting.  He wanted to walk to the mailbox and it took 2-3 times longer than usual.  He looked very SOB once he got back, had been to cardiology this week and she had encouraged to exert himself as much as he can safely tolerate.  He went to take a shower and it took longer than usual, she found him confused standing in the shower.  He walked a few steps into his room and it took 45 minutes to get him into bed.  He was very ornery last night.  He has chronic urinary issues, wears Depends.  No obvious new complaints.  He had fever at home this AM with EMS.    ED Course: Increased confusion, fatigue.  BNP mildly elevated.  Temp 101, urine issues, family concerned about UTI.  WBC 15.1, lactate 3.9.  BP soft.  O2 89, coarse, ok for now on RA.  Likely urosepsis.  Pharmacy suggested Zosyn.  COVID/flu negative.    Review of Systems: Unable to effectively perform  Ambulatory Status:Ambulates with a cane   COVID Vaccine Status:   Complete   Past Medical History:  Diagnosis Date   Alzheimer disease ([place])    Chronic kidney disease    mild insuffiency   Coronary artery disease    LHC  4/10: Mid LAD 40-50%, then 70%, OM1 20-30%, EF 65%. Mid LAD FFR 0.89 (not hemodynamically significant). Medical therapy was continued.   Dysuria 08/10/2020   Hyperlipidemia    LBBB (left bundle branch block) 06/2018   Obstructive sleep apnea     Past Surgical History:  Procedure Laterality Date   CARDIAC CATHETERIZATION  05/02/2003   single vessel,moderate stenosis mid LAD   CARDIAC CATHETERIZATION  06/12/2008   continue med. therapy   right shoulder surgery     TOTAL HIP ARTHROPLASTY Left 09/14/2018   Procedure: LEFT TOTAL HIP ARTHROPLASTY ANTERIOR APPROACH;  Surgeon: Mcarthur Rossetti, MD;  Location: [facility];  Service: Orthopedics;  Laterality: Left;    Social History   Socioeconomic History   Marital status: Widowed    Spouse name: Not on file   Number of children: 2   Years of education: Not on file   Highest education level: Not on file  Occupational History   Occupation: [employer]  Tobacco Use   Smoking status: Former    Types: Pipe    Quit date: 02/28/1990    Years since quitting: 30.5   Smokeless tobacco: Never   Tobacco comments:    smoked pipe only   Vaping Use   Vaping Use: Never used  Substance and Sexual Activity   Alcohol use: No    Alcohol/week: 0.0 standard drinks  Comment: rarely wine   Drug use: No   Sexual activity: Not on file  Other Topics Concern   Not on file  Social History Narrative   Not on file   Social Determinants of Health   Financial Resource Strain: Not on file  Food Insecurity: Not on file  Transportation Needs: Not on file  Physical Activity: Not on file  Stress: Not on file  Social Connections: Not on file  Intimate Partner Violence: Not on file    Allergies  Allergen Reactions   Darifenacin Other (See Comments)    PT STATES IT MAKES HIM "BONKERS" AND VERY TIRED   Enablex [Darifenacin Hydrobromide Er]     PT STATES IT MAKES HIM "BONKERS" AND VERY TIRED   Ceftriaxone Other (See Comments)    Reaction??    Atorvastatin Other (See Comments)    Muscle aches/weakness   Ezetimibe Other (See Comments)    Muscle aches/weakness   Levaquin [Levofloxacin] Diarrhea   Oxybutynin Other (See Comments)    Memory impairment, temperament changes   Sulfamethoxazole Other (See Comments)    Childhood allergy   Sulfasalazine Other (See Comments)    Childhood allergy   Sulfonamide Derivatives Other (See Comments)    Childhood allergy    Family History  Problem Relation Age of Onset   Cancer Mother    Hypertension Mother    Other Father        old age   Hypertension Father     Prior to Admission medications   Medication Sig Start Date End Date Taking? Authorizing Provider  aspirin 81 MG chewable tablet Chew 1 tablet (81 mg total) by mouth daily. 09/07/20   Sande Rives E, PA-C  calcium-vitamin D (OSCAL WITH D) 500-200 MG-UNIT per tablet Take 1 tablet by mouth 2 (two) times daily.    [provider]  docusate sodium (COLACE) 100 MG capsule Take 100 mg by mouth daily.    [provider]  donepezil (ARICEPT) 10 MG tablet Take 10 mg by mouth at bedtime. 12/01/16   [provider]  fesoterodine (TOVIAZ) 8 MG TB24 tablet Take 8 mg by mouth daily. 12/13/18   [provider]  fluticasone (FLONASE) 50 MCG/ACT nasal spray Place 2 sprays into both nostrils daily as needed for allergies. 05/15/17   [provider]  furosemide (LASIX) 20 MG tablet Take 1 tablet (20 mg total) by mouth daily. 09/07/20   Sande Rives E, PA-C  lisinopril (ZESTRIL) 20 MG tablet Take 20 mg by mouth daily. 08/23/18   [provider]  mirabegron ER (MYRBETRIQ) 25 MG TB24 tablet Take 25 mg by mouth daily.  08/29/18   [provider]  Misc Natural Products (OSTEO BI-FLEX/5-LOXIN ADVANCED) TABS     [provider]  Multiple Vitamin (MULTIVITAMIN) tablet Take 1 tablet by mouth daily.    [provider]  Omega-3 Fatty Acids (FISH OIL) 1000 MG CAPS Take 1 capsule  (1,000 mg total) by mouth 2 (two) times daily. 07/31/20   Little Ishikawa, MD  Psyllium (METAMUCIL PO) Take 5 mLs by mouth daily. Mix with water or juice    [provider]  rosuvastatin (CRESTOR) 10 MG tablet TAKE 1/2 TABLET BY MOUTH EVERY OTHER DAY Patient taking differently: Take 5 mg by mouth every other day. 06/22/20   [place], Peter M, MD    Physical Exam: Vitals:   09/11/20 1445 09/11/20 1500 09/11/20 1615 09/11/20 1649  BP: (!) 110/55 (!) 120/53 135/64 133/60  Pulse: 73 78  86 78  Resp: (!) 23 (!) 23 19 18   Temp:  99 F (37.2 C) 98.9 F (37.2 C) 98.4 F (36.9 C)  TempSrc:  Oral Oral Oral  SpO2: 98% 96% 98% 96%  Weight:      Height:         General:  Appears calm but somnolent, able to answer few questions  Eyes:  PERRL, EOMI, normal lids, iris ENT:  grossly normal hearing, lips & tongue, mmm; L parotid is minimally edematous, no erythema Neck:  no LAD, masses or thyromegaly Cardiovascular:  RRR, no m/r/g. No LE edema.  Respiratory:   CTA bilaterally with no wheezes/rales/rhonchi.  Mildly increased respiratory effort. Abdomen:  soft, NT, ND Skin:  no rash or induration seen on limited exam Musculoskeletal:  grossly normal tone BUE/BLE, good ROM, no bony abnormality Psychiatric:  confused mood and affect, speech sparse but somewhat appropriate, AOx1 Neurologic:  Unable to effectively perform    Radiological Exams on Admission: Independently reviewed - see discussion in A/P where applicable  CT Head Wo Contrast  Result Date: 09/11/2020 CLINICAL DATA:  Mental status change. EXAM: CT HEAD WITHOUT CONTRAST TECHNIQUE: Contiguous axial images were obtained from the base of the skull through the vertex without intravenous contrast. COMPARISON:  CT maxillofacial Jul 26, 2020. MRI head June 09, 2019. Fall FINDINGS: Brain: No evidence of acute large vascular territory infarction, hemorrhage, hydrocephalus, extra-axial collection or mass lesion/mass effect. Moderate  patchy white matter hypoattenuation, most likely related to chronic microvascular ischemic disease. Moderate atrophy with ex vacuo ventricular dilation. Vascular: Calcific atherosclerosis. No definite hyperdense vessel identified. Mildly hyperdense appearance of the right MCA is favored to relate to streak artifact in this region. Skull: No acute fracture. Sinuses/Orbits: Moderate ethmoid air cell mucosal thickening. Frothy secretions in a posterior left ethmoid air cell. No acute orbital findings. Other: No mastoid effusions. IMPRESSION: 1. No evidence of acute intracranial abnormality. 2. 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 3. Moderate ethmoid air cell mucosal thickening. Electronically Signed   By: Margaretha Sheffield MD   On: 09/11/2020 09:16   DG Chest Portable 1 View  Result Date: 09/11/2020 CLINICAL DATA:  80 year old male with history of fever and fatigue. Possible sepsis. EXAM: PORTABLE CHEST 1 VIEW COMPARISON:  Chest x-ray 07/26/2020. FINDINGS: Lung volumes are slightly low. No consolidative airspace disease. No pleural effusions. No pneumothorax. No definite suspicious appearing pulmonary nodules or masses are noted. No evidence of pulmonary edema. Mild cardiomegaly. Upper mediastinal contours are within normal limits. Atherosclerotic calcifications in the aortic arch. Tortuosity of the descending thoracic aorta. IMPRESSION: 1. No radiographic evidence of acute cardiopulmonary disease. 2. Mild cardiomegaly. 3. Aortic atherosclerosis. Electronically Signed   By: Vinnie Langton M.D.   On: 09/11/2020 08:35    EKG: Independently reviewed.  NSR with rate 84; LBBB   Labs on Admission: I have personally reviewed the available labs and imaging studies at the time of the admission.  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   -SIRS criteria in this patient includes: Leukocytosis, tachypnea -Patient has evidence of acute organ failure with elevated lactate >2 that is not easily explained by another condition. -While awaiting blood cultures, this appears to be a preseptic condition. -Sepsis protocol initiated -Suspected source is UTI -Blood and urine cultures pending -Will place in observation status with telemetry and continue to monitor -Treat with IV Zosyn (has Rocephin allergy) for presumed urinary source -Will trend lactate to ensure improvement -Will order procalcitonin level.  Antibiotics would not be indicated for PCT <0.1 and probably should not be used for < 0.25.  >0.5 indicates infection and >>0.5 indicates more serious disease.  As the procalcitonin level normalizes, it will be reasonable to consider de-escalation of antibiotic coverage.   Dementia -Continue Aricept -He is currently more confused than baseline - likely associated with infection   BPH -Continue Toviaz, mirabegron   HTN -Hold Lisinopril for now given borderline low BP   HLD -Continue Crestor -Hold fish oil due to limited inpatient utility   Stage 3a CKD -Appears to be slightly worse than usual baseline -Will hydrate and recheck in the AM   Chronic diastolic CHF/CAD -Continue ASA -Hold Lasix due to need for increased hydration in the setting of sepsis/infection  OSA -Will order CPAP      Note: This patient has been tested and  is negative for the novel coronavirus COVID-19. The patient has been fully vaccinated against COVID-19.      DVT prophylaxis:  Lovenox Code Status:  Full - confirmed with patient/family Family Communication: Daughter present throughout evaluation  Disposition Plan:  The patient is from: home  Anticipated d/c is to: home without [facility] services   Anticipated d/c date will depend on clinical response to treatment, but possibly as early as tomorrow if he has excellent response to treatment  Patient is currently: acutely ill Consults called: Nutrition Admission status: It is my clinical opinion that referral for OBSERVATION is reasonable and necessary in this patient based on the above information provided. The aforementioned taken together are felt to place the patient at high risk for further clinical deterioration. However it is anticipated that the patient may be medically stable for discharge from the hospital within 24 to 48 hours.    Karmen Bongo MD [facility]   How to contact the [facility] Attending or Consulting provider [place] or covering provider during after hours [place], for this patient?  Check the care team in [facility] and look for a) attending/consulting [facility] provider listed and b) the [facility] team listed Log into [email or web address] and use [facility] universal password to access. If you do not have the password, please contact the hospital operator. Locate the [facility] provider you are looking for under [facility] and page to a number that you can be directly reached. If you still have difficulty reaching the provider, please page the [facility] (Director on Call) for the Hospitalists listed on amion for assistance.   09/11/2020, 6:21 PM

## 2020-09-11 NOTE — ED Notes (Signed)
Patients daughter Elzie Rings [phone] update given

## 2020-09-11 NOTE — ED Notes (Signed)
Patient transported to CT 

## 2020-09-11 NOTE — Progress Notes (Signed)
NEW ADMISSION NOTE New Admission Note:   Arrival Method: Stretcher Mental Orientation:  A%O X1 Telemetry: box 17 Assessment: Completed Skin: intact IV: Left AC right hand Pain: denies Tubes: none Safety Measures: Safety Fall Prevention Plan has been given, discussed and signed Admission: Completed [facility] [place] Orientation: Patient has been orientated to the room, unit and staff.  Family: none present  Orders have been reviewed and implemented. Will continue to monitor the patient. Call light has been placed within reach and bed alarm has been activated.   Berneta Levins, RN

## 2020-09-11 NOTE — ED Notes (Signed)
ED Provider at bedside. 

## 2020-09-11 NOTE — ED Notes (Signed)
Charge nurse Thurmond Butts to place an order for transport at this time.

## 2020-09-11 NOTE — ED Notes (Signed)
Nurse report given to Eulis Foster

## 2020-09-12 DIAGNOSIS — A419 Sepsis, unspecified organism: Secondary | ICD-10-CM | POA: Diagnosis not present

## 2020-09-12 DIAGNOSIS — Z87891 Personal history of nicotine dependence: Secondary | ICD-10-CM | POA: Diagnosis not present

## 2020-09-12 DIAGNOSIS — N390 Urinary tract infection, site not specified: Secondary | ICD-10-CM

## 2020-09-12 DIAGNOSIS — N400 Enlarged prostate without lower urinary tract symptoms: Secondary | ICD-10-CM | POA: Diagnosis present

## 2020-09-12 DIAGNOSIS — I5032 Chronic diastolic (congestive) heart failure: Secondary | ICD-10-CM | POA: Diagnosis present

## 2020-09-12 DIAGNOSIS — I2510 Atherosclerotic heart disease of native coronary artery without angina pectoris: Secondary | ICD-10-CM | POA: Diagnosis present

## 2020-09-12 DIAGNOSIS — Z882 Allergy status to sulfonamides status: Secondary | ICD-10-CM | POA: Diagnosis not present

## 2020-09-12 DIAGNOSIS — Z888 Allergy status to other drugs, medicaments and biological substances status: Secondary | ICD-10-CM | POA: Diagnosis not present

## 2020-09-12 DIAGNOSIS — G309 Alzheimer's disease, unspecified: Secondary | ICD-10-CM | POA: Diagnosis present

## 2020-09-12 DIAGNOSIS — Z20822 Contact with and (suspected) exposure to covid-19: Secondary | ICD-10-CM | POA: Diagnosis present

## 2020-09-12 DIAGNOSIS — Z881 Allergy status to other antibiotic agents status: Secondary | ICD-10-CM | POA: Diagnosis not present

## 2020-09-12 DIAGNOSIS — I447 Left bundle-branch block, unspecified: Secondary | ICD-10-CM | POA: Diagnosis present

## 2020-09-12 DIAGNOSIS — Z96642 Presence of left artificial hip joint: Secondary | ICD-10-CM | POA: Diagnosis present

## 2020-09-12 DIAGNOSIS — Z7982 Long term (current) use of aspirin: Secondary | ICD-10-CM | POA: Diagnosis not present

## 2020-09-12 DIAGNOSIS — G4733 Obstructive sleep apnea (adult) (pediatric): Secondary | ICD-10-CM | POA: Diagnosis present

## 2020-09-12 DIAGNOSIS — N1831 Chronic kidney disease, stage 3a: Secondary | ICD-10-CM | POA: Diagnosis present

## 2020-09-12 DIAGNOSIS — Z8249 Family history of ischemic heart disease and other diseases of the circulatory system: Secondary | ICD-10-CM | POA: Diagnosis not present

## 2020-09-12 DIAGNOSIS — Z79899 Other long term (current) drug therapy: Secondary | ICD-10-CM | POA: Diagnosis not present

## 2020-09-12 DIAGNOSIS — Z87440 Personal history of urinary (tract) infections: Secondary | ICD-10-CM | POA: Diagnosis not present

## 2020-09-12 DIAGNOSIS — A4159 Other Gram-negative sepsis: Secondary | ICD-10-CM | POA: Diagnosis present

## 2020-09-12 DIAGNOSIS — G9341 Metabolic encephalopathy: Secondary | ICD-10-CM | POA: Diagnosis present

## 2020-09-12 DIAGNOSIS — B961 Klebsiella pneumoniae [K. pneumoniae] as the cause of diseases classified elsewhere: Secondary | ICD-10-CM | POA: Diagnosis present

## 2020-09-12 DIAGNOSIS — F0280 Dementia in other diseases classified elsewhere without behavioral disturbance: Secondary | ICD-10-CM | POA: Diagnosis present

## 2020-09-12 DIAGNOSIS — E785 Hyperlipidemia, unspecified: Secondary | ICD-10-CM | POA: Diagnosis present

## 2020-09-12 DIAGNOSIS — I130 Hypertensive heart and chronic kidney disease with heart failure and stage 1 through stage 4 chronic kidney disease, or unspecified chronic kidney disease: Secondary | ICD-10-CM | POA: Diagnosis present

## 2020-09-12 LAB — CBC
HCT: 39.9 % (ref 39.0–52.0)
Hemoglobin: 13.3 g/dL (ref 13.0–17.0)
MCH: 31.4 pg (ref 26.0–34.0)
MCHC: 33.3 g/dL (ref 30.0–36.0)
MCV: 94.1 fL (ref 80.0–100.0)
Platelets: 186 10*3/uL (ref 150–400)
RBC: 4.24 MIL/uL (ref 4.22–5.81)
RDW: 14 % (ref 11.5–15.5)
WBC: 12.8 10*3/uL — ABNORMAL HIGH (ref 4.0–10.5)
nRBC: 0 % (ref 0.0–0.2)

## 2020-09-12 LAB — BASIC METABOLIC PANEL
Anion gap: 10 (ref 5–15)
BUN: 22 mg/dL (ref 8–23)
CO2: 23 mmol/L (ref 22–32)
Calcium: 9.4 mg/dL (ref 8.9–10.3)
Chloride: 104 mmol/L (ref 98–111)
Creatinine, Ser: 1.28 mg/dL — ABNORMAL HIGH (ref 0.61–1.24)
GFR, Estimated: 56 mL/min — ABNORMAL LOW (ref >60)
Glucose, Bld: 118 mg/dL — ABNORMAL HIGH (ref 70–99)
Potassium: 4 mmol/L (ref 3.5–5.1)
Sodium: 137 mmol/L (ref 135–145)

## 2020-09-12 LAB — PROCALCITONIN: Procalcitonin: 0.57 ng/mL

## 2020-09-12 MED ORDER — ENSURE ENLIVE PO LIQD
237.0000 mL | Freq: Two times a day (BID) | ORAL | Status: DC
  Administered 2020-09-13 (×2): 237 mL via ORAL

## 2020-09-12 MED ORDER — ADULT MULTIVITAMIN W/MINERALS CH
1.0000 | ORAL_TABLET | Freq: Every day | ORAL | Status: DC
  Administered 2020-09-13: 1 via ORAL
  Filled 2020-09-12: qty 1

## 2020-09-12 NOTE — Progress Notes (Signed)
Initial Nutrition Assessment  DOCUMENTATION CODES:   Not applicable  INTERVENTION:   Ensure Enlive po BID, each supplement provides 350 kcal and 20 grams of protein MVI with minerals daily   NUTRITION DIAGNOSIS:   Increased nutrient needs related to acute illness as evidenced by estimated needs.  GOAL:   Patient will meet greater than or equal to 90% of their needs  MONITOR:   PO intake, Supplement acceptance, Weight trends, Labs, I & O's  REASON FOR ASSESSMENT:   Consult Assessment of nutrition requirement/status  ASSESSMENT:   Patient with PMH significant for OSA, HLD, CAD, CHF, and dementia. Present this admission with UTI.  Unable to obtain history given patients mental status. Last two meal completions charted as 75% and 50%. Liberalize diet to provide patient with more option. RD to provide supplementation to maximize kcal and protein this admission.   Records indicate patient's weight has fluctuated from 92.6-95.9 kg over the last year. Unable to determine dry weight loss given his of CHF. Will need to obtain NFPE to assess for malnutrition.   Medications: colace  Labs: reviewed   Diet Order:   Diet Order             Diet Heart Room service appropriate? Yes; Fluid consistency: Thin  Diet effective now                   EDUCATION NEEDS:   Not appropriate for education at this time  Skin:  Skin Assessment: Reviewed RN Assessment  Last BM:  PTA  Height:   Ht Readings from Last 1 Encounters:  09/11/20 5\' 10"  (1.778 m)    Weight:   Wt Readings from Last 1 Encounters:  09/11/20 92.6 kg    BMI:  Body mass index is 29.29 kg/m.  Estimated Nutritional Needs:   Kcal:  2100-2300 kcal  Protein:  105-120 grams  Fluid:  >/= 2 L/day   Mariana Single MS, RD, LDN, CNSC Clinical Nutrition Pager listed in [place]

## 2020-09-12 NOTE — Progress Notes (Signed)
PROGRESS NOTE    [name] [name]  WER:[id number] DOB: [birth year] DOA: 09/11/2020 PCP: Chesley Noon, MD   Brief Narrative:  [name] [name] is a 80 y.o. male with medical history significant of OSA; HLD; CAD; chronic diastolic CHF; and dementia with recent hospitalization for refractory parotitis.  He is presenting today with increased confusion in the setting of acute UTI.    Assessment & Plan:   Principal Problem:   Sepsis secondary to UTI [facility]) Active Problems:   Dyslipidemia   Obstructive sleep apnea   Stage 3a chronic kidney disease (HCC)   Benign essential hypertension   Chronic diastolic CHF (congestive heart failure) (HCC)   Alzheimer's dementia (HCC)   Sepsis secondary to UTI, POA -Continue Zosyn(ceftriaxone allergy reported previously) -cultures pending  Acute metabolic encephalopathy on chronic dementia -Continue Aricept -Acute metabolic encephalopathy likely due to UTI as above, continue to follow along with family would expect patient to return back to baseline in the next 24 to 48 hours -If mental status does not improve with antibiotic treatment will evaluate for other secondary causes   BPH -Continue Toviaz, mirabegron   HTN -Continue to hold lisinopril for borderline hypotension   HLD -Continue Crestor   Stage 3a CKD -Essentially at baseline, continue to encourage p.o. intake, currently on lactated Ringer's, will discontinue IV fluids as p.o. intake improves   Chronic diastolic CHF/CAD -Continue ASA -Hold Lasix in the setting of above   OSA -Will order CPAP    DVT prophylaxis:  Lovenox Code Status:  Full - confirmed with patient/family Family Communication: None present, will attempt to call  Status is: Inpatient  Dispo: The patient is from: Home              Anticipated d/c is to: To be determined              Anticipated d/c date is: 48 to 72 hours              Patient currently not medically stable for discharge  Consultants:   None  Procedures:  None  Antimicrobials:  Zosyn x3 days  Subjective: No acute issues or events overnight, appears to be improving at least somewhat overnight per staff, patient's review of systems are somewhat limited given his mental status but denies overt nausea vomiting diarrhea constipation headache fevers chills chest pain or shortness of breath.  Objective: Vitals:   09/11/20 1615 09/11/20 1649 09/11/20 2000 09/12/20 0444  BP: 135/64 133/60 (!) 131/53 (!) 121/53  Pulse: 86 78 80 63  Resp: 19 18 18 20   Temp: 98.9 F (37.2 C) 98.4 F (36.9 C) 99.2 F (37.3 C) 98.7 F (37.1 C)  TempSrc: Oral Oral Oral Oral  SpO2: 98% 96% 96% 97%  Weight:      Height:        Intake/Output Summary (Last 24 hours) at 09/12/2020 0736 Last data filed at 09/12/2020 0457 Gross per 24 hour  Intake 470.26 ml  Output 200 ml  Net 270.26 ml   Filed Weights   09/11/20 0803  Weight: 92.6 kg    Examination:  General exam: Appears calm and comfortable, alert to person and general situation only Respiratory system: Clear to auscultation. Respiratory effort normal. Cardiovascular system: S1 & S2 heard, RRR. No JVD, murmurs, rubs, gallops or clicks. No pedal edema. Gastrointestinal system: Abdomen is nondistended, soft and nontender. No organomegaly or masses felt. Normal bowel sounds heard. Central nervous system: Alert and oriented. No focal neurological deficits. Extremities: Symmetric  5 x 5 power. Skin: No rashes, lesions or ulcers Psychiatry: Judgement and insight appear normal. Mood & affect appropriate.     Data Reviewed: I have personally reviewed following labs and imaging studies  CBC: Recent Labs  Lab 09/11/20 0817 09/12/20 0235  WBC 15.1* 12.8*  NEUTROABS 13.0*  --   HGB 13.7 13.3  HCT 41.6 39.9  MCV 94.3 94.1  PLT 227 539   Basic Metabolic Panel: Recent Labs  Lab 09/11/20 0817 09/12/20 0235  NA 134* 137  K 3.7 4.0  CL 103 104  CO2 19* 23  GLUCOSE 203* 118*   BUN 21 22  CREATININE 1.34* 1.28*  CALCIUM 9.6 9.4   GFR: Estimated Creatinine Clearance: 50.9 mL/min (A) (by C-G formula based on SCr of 1.28 mg/dL (H)). Liver Function Tests: Recent Labs  Lab 09/11/20 0817  AST 25  ALT 21  ALKPHOS 52  BILITOT 1.5*  PROT 6.7  ALBUMIN 3.5   Recent Labs  Lab 09/11/20 0817  LIPASE 20   Recent Labs  Lab 09/11/20 0818  AMMONIA 17   Coagulation Profile: No results for input(s): INR, PROTIME in the last 168 hours. Cardiac Enzymes: No results for input(s): CKTOTAL, CKMB, CKMBINDEX, TROPONINI in the last 168 hours. BNP (last 3 results) Recent Labs    04/21/20 1510  PROBNP 100   HbA1C: No results for input(s): HGBA1C in the last 72 hours. CBG: No results for input(s): GLUCAP in the last 168 hours. Lipid Profile: No results for input(s): CHOL, HDL, LDLCALC, TRIG, CHOLHDL, LDLDIRECT in the last 72 hours. Thyroid Function Tests: No results for input(s): TSH, T4TOTAL, FREET4, T3FREE, THYROIDAB in the last 72 hours. Anemia Panel: No results for input(s): VITAMINB12, FOLATE, FERRITIN, TIBC, IRON, RETICCTPCT in the last 72 hours. Sepsis Labs: Recent Labs  Lab 09/11/20 0817 09/11/20 1053 09/11/20 1857 09/11/20 2117 09/12/20 0235  PROCALCITON  --   --  0.47  --  0.57  LATICACIDVEN 3.9* 2.1* 2.9* 1.7  --     Recent Results (from the past 240 hour(s))  Resp Panel by RT-PCR (Flu A&B, Covid) Nasopharyngeal Swab     Status: None   Collection Time: 09/11/20  8:17 AM   Specimen: Nasopharyngeal Swab; Nasopharyngeal(NP) swabs in vial transport medium  Result Value Ref Range Status   SARS Coronavirus 2 by RT PCR NEGATIVE NEGATIVE Final    Comment: (NOTE) SARS-CoV-2 target nucleic acids are NOT DETECTED.  The SARS-CoV-2 RNA is generally detectable in upper respiratory specimens during the acute phase of infection. The lowest concentration of SARS-CoV-2 viral copies this assay can detect is 138 copies/mL. A negative result does not preclude  SARS-Cov-2 infection and should not be used as the sole basis for treatment or other patient management decisions. A negative result may occur with  improper specimen collection/handling, submission of specimen other than nasopharyngeal swab, presence of viral mutation(s) within the areas targeted by this assay, and inadequate number of viral copies(<138 copies/mL). A negative result must be combined with clinical observations, patient history, and epidemiological information. The expected result is Negative.  Fact Sheet for Patients:  EntrepreneurPulse.com.au  Fact Sheet for Healthcare Providers:  IncredibleEmployment.be  This test is no t yet approved or cleared by the [place] FDA and  has been authorized for detection and/or diagnosis of SARS-CoV-2 by FDA under an Emergency Use Authorization (EUA). This EUA will remain  in effect (meaning this test can be used) for the duration of the COVID-19 declaration under Section 564(b)(1) of the  Act, 21 U.S.C.section 360bbb-3(b)(1), unless the authorization is terminated  or revoked sooner.       Influenza A by PCR NEGATIVE NEGATIVE Final   Influenza B by PCR NEGATIVE NEGATIVE Final    Comment: (NOTE) The Xpert Xpress SARS-CoV-2/FLU/RSV plus assay is intended as an aid in the diagnosis of influenza from Nasopharyngeal swab specimens and should not be used as a sole basis for treatment. Nasal washings and aspirates are unacceptable for Xpert Xpress SARS-CoV-2/FLU/RSV testing.  Fact Sheet for Patients: EntrepreneurPulse.com.au  Fact Sheet for Healthcare Providers: IncredibleEmployment.be  This test is not yet approved or cleared by the [place] FDA and has been authorized for detection and/or diagnosis of SARS-CoV-2 by FDA under an Emergency Use Authorization (EUA). This EUA will remain in effect (meaning this test can be used) for the duration of  the COVID-19 declaration under Section 564(b)(1) of the Act, 21 U.S.C. section 360bbb-3(b)(1), unless the authorization is terminated or revoked.  Performed at [facility], [place] [address] [place], [place] [address]      Radiology Studies: CT Head Wo Contrast  Result Date: 09/11/2020 CLINICAL DATA:  Mental status change. EXAM: CT HEAD WITHOUT CONTRAST TECHNIQUE: Contiguous axial images were obtained from the base of the skull through the vertex without intravenous contrast. COMPARISON:  CT maxillofacial Jul 26, 2020. MRI head June 09, 2019. Fall FINDINGS: Brain: No evidence of acute large vascular territory infarction, hemorrhage, hydrocephalus, extra-axial collection or mass lesion/mass effect. Moderate patchy white matter hypoattenuation, most likely related to chronic microvascular ischemic disease. Moderate atrophy with ex vacuo ventricular dilation. Vascular: Calcific atherosclerosis. No definite hyperdense vessel identified. Mildly hyperdense appearance of the right MCA is favored to relate to streak artifact in this region. Skull: No acute fracture. Sinuses/Orbits: Moderate ethmoid air cell mucosal thickening. Frothy secretions in a posterior left ethmoid air cell. No acute orbital findings. Other: No mastoid effusions. IMPRESSION: 1. No evidence of acute intracranial abnormality. 2. 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 3. Moderate ethmoid air cell mucosal thickening. Electronically Signed   By: Margaretha Sheffield MD   On: 09/11/2020 09:16   DG Chest Portable 1 View  Result Date: 09/11/2020 CLINICAL DATA:  80 year old male with history of fever and fatigue. Possible sepsis. EXAM: PORTABLE CHEST 1 VIEW COMPARISON:  Chest x-ray 07/26/2020. FINDINGS: Lung volumes are slightly low. No consolidative airspace disease. No pleural effusions. No pneumothorax. No definite suspicious appearing pulmonary nodules or masses are noted. No evidence of pulmonary edema. Mild  cardiomegaly. Upper mediastinal contours are within normal limits. Atherosclerotic calcifications in the aortic arch. Tortuosity of the descending thoracic aorta. IMPRESSION: 1. No radiographic evidence of acute cardiopulmonary disease. 2. Mild cardiomegaly. 3. Aortic atherosclerosis. Electronically Signed   By: Vinnie Langton M.D.   On: 09/11/2020 08:35    Scheduled Meds:  aspirin  81 mg Oral BID   docusate sodium  200 mg Oral BID   donepezil  10 mg Oral QHS   enoxaparin (LOVENOX) injection  40 mg Subcutaneous Q24H   fesoterodine  8 mg Oral Daily   mirabegron ER  25 mg Oral Daily   rosuvastatin  5 mg Oral QODAY   sodium chloride flush  3 mL Intravenous Q12H   Continuous Infusions:  lactated ringers 75 mL/hr at 09/11/20 1820   piperacillin-tazobactam (ZOSYN)  IV 3.375 g (09/12/20 0457)     LOS: 0 days   Time spent: 85min  Mela Perham C Amybeth Sieg, DO [facility]  If 7PM-7AM, please contact night-coverage [email or web address]  09/12/2020, 7:36 AM

## 2020-09-12 NOTE — Progress Notes (Signed)
Daughter, Elzie Rings, called this nurse for update on her father.  She states has had difficulties caring for him at home.  Is worried re:  his deconditioning and whether or not she would be able to manage him at home.  Touched upon potential for PT / OT services.

## 2020-09-13 DIAGNOSIS — N390 Urinary tract infection, site not specified: Secondary | ICD-10-CM | POA: Diagnosis not present

## 2020-09-13 DIAGNOSIS — A419 Sepsis, unspecified organism: Secondary | ICD-10-CM | POA: Diagnosis not present

## 2020-09-13 LAB — BASIC METABOLIC PANEL
Anion gap: 7 (ref 5–15)
BUN: 14 mg/dL (ref 8–23)
CO2: 24 mmol/L (ref 22–32)
Calcium: 8.9 mg/dL (ref 8.9–10.3)
Chloride: 102 mmol/L (ref 98–111)
Creatinine, Ser: 1.19 mg/dL (ref 0.61–1.24)
GFR, Estimated: >60 mL/min (ref >60)
Glucose, Bld: 120 mg/dL — ABNORMAL HIGH (ref 70–99)
Potassium: 3.8 mmol/L (ref 3.5–5.1)
Sodium: 133 mmol/L — ABNORMAL LOW (ref 135–145)

## 2020-09-13 LAB — URINE CULTURE: Culture: >=100000 — AB

## 2020-09-13 LAB — CBC
HCT: 36.5 % — ABNORMAL LOW (ref 39.0–52.0)
Hemoglobin: 12.3 g/dL — ABNORMAL LOW (ref 13.0–17.0)
MCH: 31.1 pg (ref 26.0–34.0)
MCHC: 33.7 g/dL (ref 30.0–36.0)
MCV: 92.4 fL (ref 80.0–100.0)
Platelets: 179 10*3/uL (ref 150–400)
RBC: 3.95 MIL/uL — ABNORMAL LOW (ref 4.22–5.81)
RDW: 13.6 % (ref 11.5–15.5)
WBC: 7.5 10*3/uL (ref 4.0–10.5)
nRBC: 0 % (ref 0.0–0.2)

## 2020-09-13 MED ORDER — AMOXICILLIN-POT CLAVULANATE 875-125 MG PO TABS: 1.0000 | ORAL_TABLET | Freq: Two times a day (BID) | ORAL | 0 refills | Status: AC

## 2020-09-13 NOTE — Progress Notes (Signed)
[name] [name] to be discharged Home per MD order. Discussed prescriptions and follow up appointments with the patient's daughter. Prescriptions and medication list explained in detail. Patient verbalized understanding.  Skin clean, dry and intact without evidence of skin break down, no evidence of skin tears noted. IV catheter discontinued intact. Site without signs and symptoms of complications. Dressing and pressure applied. Pt denies pain at the site currently. No complaints noted.  Patient free of lines, drains, and wounds.   An After Visit Summary (AVS) was printed and given to the patient. Patient escorted via wheelchair, and discharged home via private auto.  Amaryllis Dyke, RN

## 2020-09-13 NOTE — Discharge Summary (Signed)
Physician Discharge Summary  [name] [name] VQQ:[id number] DOB: 11/11/1940 DOA: 09/11/2020  PCP: Chesley Noon, MD  Admit date: 09/11/2020 Discharge date: 09/13/2020  Admitted From: Home Disposition: Home  Recommendations for Outpatient Follow-up:  Follow up with PCP in 1-2 weeks Please obtain BMP/CBC in one week  Home Health: Outpatient physical therapy Equipment/Devices: None  Discharge Condition: Stable CODE STATUS: Full Diet recommendation: As tolerated  Brief/Interim Summary: [name] [name] is a 80 y.o. male with medical history significant of OSA; HLD; CAD; chronic diastolic CHF; and dementia with recent hospitalization for refractory parotitis.  He is presenting today with increased confusion in the setting of acute UTI.  Patient admitted as above with acute mental status changes, encephalopathy on chronic dementia, likely in the setting of sepsis due to Klebsiella UTI.  Patient placed initially on Zosyn due to cephalosporin allergy transition to Augmentin at discharge, patient appears to have had multiple recurrent UTI in the past few weeks to months.  Would recommend follow-up with PCP in the next week and if necessary urology if he continues to have issues, we discussed possible obstruction causing recurrent issues.  Patient was evaluated by physical therapy, daughter [name] [name] at bedside indicates patient has had worsening ambulatory dysfunction, upon evaluation patient was evaluated, recommended to use a rolling walker and follow-up with outpatient physical therapy until more stable as he is previously been using a cane at home but there is concern for increased fall risk.  Patient otherwise stable and agreeable for discharge home  Discharge Diagnoses:  Principal Problem:   Sepsis secondary to UTI [facility]) Active Problems:   Dyslipidemia   Obstructive sleep apnea   Stage 3a chronic kidney disease ([place])   Benign essential hypertension   Chronic diastolic CHF (congestive  heart failure) (HCC)   Alzheimer's dementia [facility])    Discharge Instructions  Discharge Instructions     Call MD for:  temperature >100.4   Complete by: As directed    Diet - low sodium heart healthy   Complete by: As directed    Increase activity slowly   Complete by: As directed       Allergies as of 09/13/2020       Reactions   Darifenacin Other (See Comments)   PT STATES IT MAKES HIM "BONKERS" AND VERY TIRED   Enablex [darifenacin Hydrobromide Er]    PT STATES IT MAKES HIM "BONKERS" AND VERY TIRED   Ceftriaxone Other (See Comments)   Reaction??   Atorvastatin Other (See Comments)   Muscle aches/weakness   Ezetimibe Other (See Comments)   Muscle aches/weakness   Levaquin [levofloxacin] Diarrhea   Oxybutynin Other (See Comments)   Memory impairment, temperament changes   Sulfamethoxazole Other (See Comments)   Childhood allergy   Sulfasalazine Other (See Comments)   Childhood allergy   Sulfonamide Derivatives Other (See Comments)   Childhood allergy        Medication List     TAKE these medications    amoxicillin-clavulanate 875-125 MG tablet Commonly known as: Augmentin Take 1 tablet by mouth 2 (two) times daily for 3 days.   aspirin 81 MG chewable tablet Chew 1 tablet (81 mg total) by mouth daily. What changed: when to take this   calcium-vitamin D 500-200 MG-UNIT tablet Commonly known as: OSCAL WITH D Take 1 tablet by mouth 2 (two) times daily.   docusate sodium 100 MG capsule Commonly known as: COLACE Take 200 mg by mouth 2 (two) times daily.   donepezil 10 MG tablet Commonly  known as: ARICEPT Take 10 mg by mouth at bedtime.   Fish Oil 1200 MG Caps Take 1,200 mg by mouth daily.   Fish Oil 1000 MG Caps Take 1 capsule (1,000 mg total) by mouth 2 (two) times daily.   fluticasone 50 MCG/ACT nasal spray Commonly known as: FLONASE Place 2 sprays into both nostrils daily as needed for allergies.   furosemide 20 MG tablet Commonly known as:  LASIX Take 1 tablet (20 mg total) by mouth daily.   lisinopril 20 MG tablet Commonly known as: ZESTRIL Take 20 mg by mouth daily.   METAMUCIL PO Take 3.4 g by mouth See admin instructions. Mix 3.4 grams of powder into 8 ounces of water or juice and drink once a day   mirabegron ER 25 MG Tb24 tablet Commonly known as: MYRBETRIQ Take 25 mg by mouth daily.   multivitamin tablet Take 1 tablet by mouth daily.   Osteo Bi-Flex/5-Loxin Advanced Tabs Take 1 tablet by mouth 2 (two) times daily.   rosuvastatin 10 MG tablet Commonly known as: CRESTOR TAKE 1/2 TABLET BY MOUTH EVERY OTHER DAY   Toviaz 8 MG Tb24 tablet Generic drug: fesoterodine Take 8 mg by mouth daily.        Allergies  Allergen Reactions   Darifenacin Other (See Comments)    PT STATES IT MAKES HIM "BONKERS" AND VERY TIRED   Enablex [Darifenacin Hydrobromide Er]     PT STATES IT MAKES HIM "BONKERS" AND VERY TIRED   Ceftriaxone Other (See Comments)    Reaction??   Atorvastatin Other (See Comments)    Muscle aches/weakness   Ezetimibe Other (See Comments)    Muscle aches/weakness   Levaquin [Levofloxacin] Diarrhea   Oxybutynin Other (See Comments)    Memory impairment, temperament changes   Sulfamethoxazole Other (See Comments)    Childhood allergy   Sulfasalazine Other (See Comments)    Childhood allergy   Sulfonamide Derivatives Other (See Comments)    Childhood allergy    Consultations: None   Procedures/Studies: CT Head Wo Contrast  Result Date: 09/11/2020 CLINICAL DATA:  Mental status change. EXAM: CT HEAD WITHOUT CONTRAST TECHNIQUE: Contiguous axial images were obtained from the base of the skull through the vertex without intravenous contrast. COMPARISON:  CT maxillofacial Jul 26, 2020. MRI head June 09, 2019. Fall FINDINGS: Brain: No evidence of acute large vascular territory infarction, hemorrhage, hydrocephalus, extra-axial collection or mass lesion/mass effect. Moderate patchy white matter  hypoattenuation, most likely related to chronic microvascular ischemic disease. Moderate atrophy with ex vacuo ventricular dilation. Vascular: Calcific atherosclerosis. No definite hyperdense vessel identified. Mildly hyperdense appearance of the right MCA is favored to relate to streak artifact in this region. Skull: No acute fracture. Sinuses/Orbits: Moderate ethmoid air cell mucosal thickening. Frothy secretions in a posterior left ethmoid air cell. No acute orbital findings. Other: No mastoid effusions. IMPRESSION: 1. No evidence of acute intracranial abnormality. 2. 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 3. Moderate ethmoid air cell mucosal thickening. Electronically Signed   By: Margaretha Sheffield MD   On: 09/11/2020 09:16   DG Chest Portable 1 View  Result Date: 09/11/2020 CLINICAL DATA:  80 year old male with history of fever and fatigue. Possible sepsis. EXAM: PORTABLE CHEST 1 VIEW COMPARISON:  Chest x-ray 07/26/2020. FINDINGS: Lung volumes are slightly low. No consolidative airspace disease. No pleural effusions. No pneumothorax. No definite suspicious appearing pulmonary nodules or masses are noted. No evidence of pulmonary edema. Mild cardiomegaly. Upper mediastinal contours are within normal limits. Atherosclerotic calcifications  in the aortic arch. Tortuosity of the descending thoracic aorta. IMPRESSION: 1. No radiographic evidence of acute cardiopulmonary disease. 2. Mild cardiomegaly. 3. Aortic atherosclerosis. Electronically Signed   By: Vinnie Langton M.D.   On: 09/11/2020 08:35     Subjective: No acute issues or events overnight denies nausea vomiting diarrhea constipation headache fevers or chills.   Discharge Exam: Vitals:   09/13/20 0530 09/13/20 0911  BP: (!) 153/76 (!) 142/73  Pulse: 75 76  Resp: 16 18  Temp: 98.4 F (36.9 C) 98 F (36.7 C)  SpO2: 92% 93%   Vitals:   09/12/20 2109 09/12/20 2159 09/13/20 0530 09/13/20 0911  BP: 130/65  (!) 153/76  (!) 142/73  Pulse: 87 85 75 76  Resp: 17 16 16 18   Temp: 98.4 F (36.9 C)  98.4 F (36.9 C) 98 F (36.7 C)  TempSrc:    Oral  SpO2: 96% 95% 92% 93%  Weight: 92.6 kg     Height:        General: Pt is alert, awake, not in acute distress Cardiovascular: RRR, S1/S2 +, no rubs, no gallops Respiratory: CTA bilaterally, no wheezing, no rhonchi Abdominal: Soft, NT, ND, bowel sounds + Extremities: no edema, no cyanosis  The results of significant diagnostics from this hospitalization (including imaging, microbiology, ancillary and laboratory) are listed below for reference.     Microbiology: Recent Results (from the past 240 hour(s))  Resp Panel by RT-PCR (Flu A&B, Covid) Nasopharyngeal Swab     Status: None   Collection Time: 09/11/20  8:17 AM   Specimen: Nasopharyngeal Swab; Nasopharyngeal(NP) swabs in vial transport medium  Result Value Ref Range Status   SARS Coronavirus 2 by RT PCR NEGATIVE NEGATIVE Final    Comment: (NOTE) SARS-CoV-2 target nucleic acids are NOT DETECTED.  The SARS-CoV-2 RNA is generally detectable in upper respiratory specimens during the acute phase of infection. The lowest concentration of SARS-CoV-2 viral copies this assay can detect is 138 copies/mL. A negative result does not preclude SARS-Cov-2 infection and should not be used as the sole basis for treatment or other patient management decisions. A negative result may occur with  improper specimen collection/handling, submission of specimen other than nasopharyngeal swab, presence of viral mutation(s) within the areas targeted by this assay, and inadequate number of viral copies(<138 copies/mL). A negative result must be combined with clinical observations, patient history, and epidemiological information. The expected result is Negative.  Fact Sheet for Patients:  EntrepreneurPulse.com.au  Fact Sheet for Healthcare Providers:  IncredibleEmployment.be  This  test is no t yet approved or cleared by the [place] FDA and  has been authorized for detection and/or diagnosis of SARS-CoV-2 by FDA under an Emergency Use Authorization (EUA). This EUA will remain  in effect (meaning this test can be used) for the duration of the COVID-19 declaration under Section 564(b)(1) of the Act, 21 U.S.C.section 360bbb-3(b)(1), unless the authorization is terminated  or revoked sooner.       Influenza A by PCR NEGATIVE NEGATIVE Final   Influenza B by PCR NEGATIVE NEGATIVE Final    Comment: (NOTE) The Xpert Xpress SARS-CoV-2/FLU/RSV plus assay is intended as an aid in the diagnosis of influenza from Nasopharyngeal swab specimens and should not be used as a sole basis for treatment. Nasal washings and aspirates are unacceptable for Xpert Xpress SARS-CoV-2/FLU/RSV testing.  Fact Sheet for Patients: EntrepreneurPulse.com.au  Fact Sheet for Healthcare Providers: IncredibleEmployment.be  This test is not yet approved or cleared by the [place] FDA  and has been authorized for detection and/or diagnosis of SARS-CoV-2 by FDA under an Emergency Use Authorization (EUA). This EUA will remain in effect (meaning this test can be used) for the duration of the COVID-19 declaration under Section 564(b)(1) of the Act, 21 U.S.C. section 360bbb-3(b)(1), unless the authorization is terminated or revoked.  Performed at [facility], [place] [address] [place], [place] [address]   Blood culture (routine x 2)     Status: None (Preliminary result)   Collection Time: 09/11/20  8:18 AM   Specimen: BLOOD RIGHT HAND  Result Value Ref Range Status   Specimen Description BLOOD RIGHT HAND  Final   Special Requests   Final    BOTTLES DRAWN AEROBIC AND ANAEROBIC Blood Culture adequate volume   Culture   Final    NO GROWTH 1 DAY Performed at [facility], [place] [address], [place], [place] [address]    Report Status PENDING   Incomplete  Blood culture (routine x 2)     Status: None (Preliminary result)   Collection Time: 09/11/20  8:23 AM   Specimen: BLOOD RIGHT ARM  Result Value Ref Range Status   Specimen Description BLOOD RIGHT ARM  Final   Special Requests   Final    BOTTLES DRAWN AEROBIC AND ANAEROBIC Blood Culture adequate volume   Culture   Final    NO GROWTH 1 DAY Performed at [facility], [place] [address], [place], [place] [address]    Report Status PENDING  Incomplete  Urine Culture     Status: Abnormal   Collection Time: 09/11/20  8:42 AM   Specimen: Urine, Clean Catch  Result Value Ref Range Status   Specimen Description URINE, CLEAN CATCH  Final   Special Requests   Final    NONE Performed at [facility], [address], [place], [place] [address]    Culture >=100,000 COLONIES/mL KLEBSIELLA PNEUMONIAE (A)  Final   Report Status 09/13/2020 FINAL  Final   Organism ID, Bacteria KLEBSIELLA PNEUMONIAE (A)  Final      Susceptibility   Klebsiella pneumoniae - MIC*    AMPICILLIN >=32 RESISTANT Resistant     CEFAZOLIN <=4 SENSITIVE Sensitive     CEFEPIME <=0.12 SENSITIVE Sensitive     CEFTRIAXONE <=0.25 SENSITIVE Sensitive     CIPROFLOXACIN 0.5 SENSITIVE Sensitive     GENTAMICIN <=1 SENSITIVE Sensitive     IMIPENEM <=0.25 SENSITIVE Sensitive     NITROFURANTOIN 256 RESISTANT Resistant     TRIMETH/SULFA <=20 SENSITIVE Sensitive     AMPICILLIN/SULBACTAM >=32 RESISTANT Resistant     PIP/TAZO 32 INTERMEDIATE Intermediate     * >=100,000 COLONIES/mL KLEBSIELLA PNEUMONIAE     Labs: BNP (last 3 results) Recent Labs    09/11/20 0818  BNP 295.6*   Basic Metabolic Panel: Recent Labs  Lab 09/11/20 0817 09/12/20 0235 09/13/20 0418  NA 134* 137 133*  K 3.7 4.0 3.8  CL 103 104 102  CO2 19* 23 24  GLUCOSE 203* 118* 120*  BUN 21 22 14   CREATININE 1.34* 1.28* 1.19  CALCIUM 9.6 9.4 8.9   Liver Function Tests: Recent Labs  Lab 09/11/20 0817  AST 25  ALT 21  ALKPHOS 52   BILITOT 1.5*  PROT 6.7  ALBUMIN 3.5   Recent Labs  Lab 09/11/20 0817  LIPASE 20   Recent Labs  Lab 09/11/20 0818  AMMONIA 17   CBC: Recent Labs  Lab 09/11/20 0817 09/12/20 0235 09/13/20 0418  WBC 15.1* 12.8* 7.5  NEUTROABS 13.0*  --   --   HGB 13.7 13.3 12.3*  HCT 41.6 39.9 36.5*  MCV 94.3 94.1 92.4  PLT 227 186 179   Cardiac Enzymes: No results for input(s): CKTOTAL, CKMB, CKMBINDEX, TROPONINI in the last 168 hours. BNP: Invalid input(s): POCBNP CBG: No results for input(s): GLUCAP in the last 168 hours. D-Dimer No results for input(s): DDIMER in the last 72 hours. Hgb A1c No results for input(s): HGBA1C in the last 72 hours. Lipid Profile No results for input(s): CHOL, HDL, LDLCALC, TRIG, CHOLHDL, LDLDIRECT in the last 72 hours. Thyroid function studies No results for input(s): TSH, T4TOTAL, T3FREE, THYROIDAB in the last 72 hours.  Invalid input(s): FREET3 Anemia work up No results for input(s): VITAMINB12, FOLATE, FERRITIN, TIBC, IRON, RETICCTPCT in the last 72 hours. Urinalysis    Component Value Date/Time   COLORURINE AMBER (A) 09/11/2020 0842   APPEARANCEUR CLOUDY (A) 09/11/2020 0842   LABSPEC 1.020 09/11/2020 0842   PHURINE 5.5 09/11/2020 0842   GLUCOSEU NEGATIVE 09/11/2020 0842   HGBUR SMALL (A) 09/11/2020 0842   BILIRUBINUR SMALL (A) 09/11/2020 0842   KETONESUR NEGATIVE 09/11/2020 0842   PROTEINUR NEGATIVE 09/11/2020 0842   NITRITE NEGATIVE 09/11/2020 0842   LEUKOCYTESUR SMALL (A) 09/11/2020 0842   Sepsis Labs Invalid input(s): PROCALCITONIN,  WBC,  LACTICIDVEN Microbiology Recent Results (from the past 240 hour(s))  Resp Panel by RT-PCR (Flu A&B, Covid) Nasopharyngeal Swab     Status: None   Collection Time: 09/11/20  8:17 AM   Specimen: Nasopharyngeal Swab; Nasopharyngeal(NP) swabs in vial transport medium  Result Value Ref Range Status   SARS Coronavirus 2 by RT PCR NEGATIVE NEGATIVE Final    Comment: (NOTE) SARS-CoV-2 target nucleic  acids are NOT DETECTED.  The SARS-CoV-2 RNA is generally detectable in upper respiratory specimens during the acute phase of infection. The lowest concentration of SARS-CoV-2 viral copies this assay can detect is 138 copies/mL. A negative result does not preclude SARS-Cov-2 infection and should not be used as the sole basis for treatment or other patient management decisions. A negative result may occur with  improper specimen collection/handling, submission of specimen other than nasopharyngeal swab, presence of viral mutation(s) within the areas targeted by this assay, and inadequate number of viral copies(<138 copies/mL). A negative result must be combined with clinical observations, patient history, and epidemiological information. The expected result is Negative.  Fact Sheet for Patients:  EntrepreneurPulse.com.au  Fact Sheet for Healthcare Providers:  IncredibleEmployment.be  This test is no t yet approved or cleared by the [place] FDA and  has been authorized for detection and/or diagnosis of SARS-CoV-2 by FDA under an Emergency Use Authorization (EUA). This EUA will remain  in effect (meaning this test can be used) for the duration of the COVID-19 declaration under Section 564(b)(1) of the Act, 21 U.S.C.section 360bbb-3(b)(1), unless the authorization is terminated  or revoked sooner.       Influenza A by PCR NEGATIVE NEGATIVE Final   Influenza B by PCR NEGATIVE NEGATIVE Final    Comment: (NOTE) The Xpert Xpress SARS-CoV-2/FLU/RSV plus assay is intended as an aid in the diagnosis of influenza from Nasopharyngeal swab specimens and should not be used as a sole basis for treatment. Nasal washings and aspirates are unacceptable for Xpert Xpress SARS-CoV-2/FLU/RSV testing.  Fact Sheet for Patients: EntrepreneurPulse.com.au  Fact Sheet for Healthcare Providers: IncredibleEmployment.be  This  test is not yet approved or cleared by the [place] FDA and has  been authorized for detection and/or diagnosis of SARS-CoV-2 by FDA under an Emergency Use Authorization (EUA). This EUA will remain in effect (meaning this test can be used) for the duration of the COVID-19 declaration under Section 564(b)(1) of the Act, 21 U.S.C. section 360bbb-3(b)(1), unless the authorization is terminated or revoked.  Performed at [facility], [place] [address], [place], [place] [address]   Blood culture (routine x 2)     Status: None (Preliminary result)   Collection Time: 09/11/20  8:18 AM   Specimen: BLOOD RIGHT HAND  Result Value Ref Range Status   Specimen Description BLOOD RIGHT HAND  Final   Special Requests   Final    BOTTLES DRAWN AEROBIC AND ANAEROBIC Blood Culture adequate volume   Culture   Final    NO GROWTH 1 DAY Performed at [facility], [place] [address], [place], [place] [address]    Report Status PENDING  Incomplete  Blood culture (routine x 2)     Status: None (Preliminary result)   Collection Time: 09/11/20  8:23 AM   Specimen: BLOOD RIGHT ARM  Result Value Ref Range Status   Specimen Description BLOOD RIGHT ARM  Final   Special Requests   Final    BOTTLES DRAWN AEROBIC AND ANAEROBIC Blood Culture adequate volume   Culture   Final    NO GROWTH 1 DAY Performed at [facility], [place] [address], [place], [place] [address]    Report Status PENDING  Incomplete  Urine Culture     Status: Abnormal   Collection Time: 09/11/20  8:42 AM   Specimen: Urine, Clean Catch  Result Value Ref Range Status   Specimen Description URINE, CLEAN CATCH  Final   Special Requests   Final    NONE Performed at [facility], [address] [place], [place] [address]    Culture >=100,000 COLONIES/mL KLEBSIELLA PNEUMONIAE (A)  Final   Report Status 09/13/2020 FINAL  Final   Organism ID, Bacteria KLEBSIELLA PNEUMONIAE (A)  Final      Susceptibility   Klebsiella pneumoniae  - MIC*    AMPICILLIN >=32 RESISTANT Resistant     CEFAZOLIN <=4 SENSITIVE Sensitive     CEFEPIME <=0.12 SENSITIVE Sensitive     CEFTRIAXONE <=0.25 SENSITIVE Sensitive     CIPROFLOXACIN 0.5 SENSITIVE Sensitive     GENTAMICIN <=1 SENSITIVE Sensitive     IMIPENEM <=0.25 SENSITIVE Sensitive     NITROFURANTOIN 256 RESISTANT Resistant     TRIMETH/SULFA <=20 SENSITIVE Sensitive     AMPICILLIN/SULBACTAM >=32 RESISTANT Resistant     PIP/TAZO 32 INTERMEDIATE Intermediate     * >=100,000 COLONIES/mL KLEBSIELLA PNEUMONIAE     Time coordinating discharge: Over 30 minutes  SIGNED:   Little Ishikawa, DO [facility] 09/13/2020, 11:47 AM Pager   If 7PM-7AM, please contact night-coverage [email or web address]

## 2020-09-13 NOTE — Progress Notes (Signed)
Physical Therapy Note  PT eval complete with full note to follow;  Recommend Home with Outpt PT follow up for transition out of hospital;  Able to walk the hallways with minguard assist and RW;  Recommend full-time use of RW at this time;  Pt's daughter Elzie Rings was present for PT session, and participated in a lengthy discussion re: pros and cons of home today, and pros and cons of HHPT vs Outpt PT follow up;  Ultimately, gretchen indicated that she and her brother can coordinate to be able to dc home today, and she chose Outpt PT follow up;  In communication with MD, RN, and TOC re: findings and recs;   Roney Marion, [place]  Acute Rehabilitation Services Pager [phone] Office [phone]

## 2020-09-13 NOTE — Evaluation (Signed)
Physical Therapy Evaluation Patient Details Name: [name] [name] MRN: [id number] DOB: 02/02/1941 Today's Date: 09/13/2020   History of Present Illness  Admitted with sepsis from a urinary source; Recent hospitalization for parotitis;  has a past medical history of Alzheimer disease ([place]), Chronic kidney disease, Coronary artery disease, Dysuria (08/10/2020), Hyperlipidemia, LBBB (left bundle branch block) (06/2018), and Obstructive sleep apnea.  Clinical Impression   Pt admitted with above diagnosis. Comes from home where he lives with is daughter, and son is nearby; single level home with a level entry; Daughter Is a [employer] bus driver, and is home 24 hr in the summer; Pt presents to PT with generalized weakness, gait unsteadiness;  Pt currently with functional limitations due to the deficits listed below (see PT Problem List). Pt will benefit from skilled PT to increase their independence and safety with mobility to allow discharge to the venue listed below.    Lengthy discussion re: considerations for HHPT vs Outpt PT, and dc today vs another night inpatient; Ultimately, [name] opted for gong home today with Outpt PT follow up; communicated recs to medical team     Follow Up Recommendations Outpatient PT    Equipment Recommendations  None recommended by PT    Recommendations for Other Services       Precautions / Restrictions Precautions Precautions: Fall Precaution Comments: Fall risk mitigated with use of RW      Mobility  Bed Mobility                    Transfers Overall transfer level: Needs assistance Equipment used: Rolling walker (2 wheeled) Transfers: Sit to/from Stand Sit to Stand: Min guard         General transfer comment: Needed cues to scoot to edge of chair, and to initiate with forward lean  Ambulation/Gait Ambulation/Gait assistance: Min guard (with and without physical contact) Gait Distance (Feet): 130 Feet Assistive device: Rolling  walker (2 wheeled) Gait Pattern/deviations: Step-through pattern;Decreased step length - right;Decreased step length - left;Trunk flexed     General Gait Details: tending to keep upper trunk flexed; mod cues to keep RW close; ran into objects, but able to manage reroute RW around objects without assist after running into them  Stairs            Wheelchair Mobility    Modified Rankin (Stroke Patients Only)       Balance Overall balance assessment: Needs assistance Sitting-balance support: Feet supported Sitting balance-Leahy Scale: Fair (approaching Good)       Standing balance-Leahy Scale: Poor Standing balance comment: Dependent on UE support                             Pertinent Vitals/Pain Pain Assessment: No/denies pain    Home Living Family/patient expects to be discharged to:: Private residence Living Arrangements: Children Available Help at Discharge: Family;Available 24 hours/day (until school starts; dtr is a bus driver) Type of Home: House Home Access: Level entry     Home Layout: One [place]: Walker - 2 wheels;Crutches;Toilet riser      Prior Function Level of Independence: Independent with assistive device(s)         Comments: walks with a cane consistently     Hand Dominance        Extremity/Trunk Assessment   Upper Extremity Assessment Upper Extremity Assessment: Generalized weakness    Lower Extremity Assessment Lower Extremity Assessment: Generalized weakness  Cervical / Trunk Assessment Cervical / Trunk Assessment: Normal  Communication   Communication: No difficulties  Cognition Arousal/Alertness: Awake/alert Behavior During Therapy: WFL for tasks assessed/performed Overall Cognitive Status: History of cognitive impairments - at baseline                                 General Comments: Pleasant and participating well      General Comments General comments (skin integrity,  edema, etc.): Daughter present and participating in session; Questions solicited and answered to the best of my ability    Exercises     Assessment/Plan    PT Assessment Patient needs continued PT services  PT Problem List Decreased strength;Decreased activity tolerance;Decreased balance;Decreased mobility;Decreased coordination;Decreased cognition;Decreased knowledge of use of DME;Decreased safety awareness;Decreased knowledge of precautions       PT Treatment Interventions DME instruction;Gait training;Stair training;Functional mobility training;Therapeutic activities;Therapeutic exercise;Balance training;Neuromuscular re-education;Cognitive remediation;Patient/family education    PT Goals (Current goals can be found in the Care Plan section)  Acute Rehab PT Goals Patient Stated Goal: Indicated he REALLY wants to eat lunch PT Goal Formulation: With patient Time For Goal Achievement: 09/27/20 Potential to Achieve Goals: Good    Frequency Min 3X/week   Barriers to discharge        Co-evaluation               AM-PAC PT "6 Clicks" Mobility  Outcome Measure Help needed turning from your back to your side while in a flat bed without using bedrails?: A Little Help needed moving from lying on your back to sitting on the side of a flat bed without using bedrails?: A Little Help needed moving to and from a bed to a chair (including a wheelchair)?: A Little Help needed standing up from a chair using your arms (e.g., wheelchair or bedside chair)?: A Little Help needed to walk in hospital room?: A Little Help needed climbing 3-5 steps with a railing? : A Little 6 Click Score: 18    End of Session Equipment Utilized During Treatment: Gait belt Activity Tolerance: Patient tolerated treatment well Patient left: in chair;with call bell/phone within reach;with chair alarm set;with family/visitor present (preparing to eat lunch) Nurse Communication: Mobility status PT Visit  Diagnosis: Other abnormalities of gait and mobility (R26.89)    Time: 7741-2878 PT Time Calculation (min) (ACUTE ONLY): 36 min   Charges:   PT Evaluation $PT Eval Low Complexity: 1 Low PT Treatments $Gait Training: 8-22 mins        Roney Marion, PT  [place] Pager [phone] Office [place] 09/13/2020, 2:42 PM

## 2020-09-16 LAB — CULTURE, BLOOD (ROUTINE X 2)
Culture: NO GROWTH
Culture: NO GROWTH
Special Requests: ADEQUATE
Special Requests: ADEQUATE

## 2020-11-25 NOTE — Progress Notes (Deleted)
Cardiology Office Note:    Date:  11/25/2020   ID:  [name] [name], DOB 18-Nov-1940, MRN [id number]  PCP:  Chesley Noon, MD  Cardiologist:  Sumi Lye Martinique, MD  Electrophysiologist:  None   Referring MD: Chesley Noon, MD   Chief Complaint: follow-up of shortness of breath  History of Present Illness:    [name] [name] is a 80 y.o. male with a history of moderate non-obstructive CAD on cardiac catheterization in 2010 chronic shortness of breath, LBBB, hypertension, hyperlipidemia, obstructive sleep apnea, CKD stage III, and Alzheimer disease who presents today for follow-up of shortness of breath.  Patient has a history of non-obstructive CAD. Cardiac catheterization in 2010 showed single vessel CAD with intermediate stenosis of LAD. FFR was negative so medical therapy was recommended. Myoview in 06/2018 which was done for further evaluation of new LBBB on EKG was low risk with no evidence of ischemia. Patient was last seen by Dr. Martinique in 04/2020 at which time he reported shortness of breath when bending over. But denied any chest pain or other symptoms of CHF. He had no clinical signs of CHF. Echo was ordered and showed LVEF of 60-65% with normal wall motion, mild LVH, and grade 1 diastolic dysfunction as well as mild MR. Dyspnea was felt to be due to deconditioning and not CHF.  Since his last visit, he has been seen by multiple provider (including ED provider, ID, and ENT) for acute parotitis and has been treated with antibiotics. He was also admitted from 07/26/2020 to 07/31/2020 for this. He was last seen by ENT on 08/14/2020 at which time symptoms had improved but he was still having fullness of left parotid gland. Salivary precautions were recommended with plans to follow-up in 6 weeks.  Was seen by Sande Rives in July for follow-up of shortness of breath. Here with daughter. Patient reports continued shortness of breath and states it has been increasing over the last year. Occurs  with minimal activity such as bending over or reaching to get something at the dinner table. No orthopnea, PND, or lower extremity edema. No weight gain (he has actually been losing weight). No palpitations. He has some mild lightheadedness/dizziness with position changes. No syncope. Daughter expressed concern that during patient's admission, they were told by General Medicine team that he had CHF and atrial fibrillation. He was not treated for CHF exacerbation during hospitalization so unclear where this diagnosis came from. Also did not see any mention of atrial fibrillation in any of the hospital notes or clear atrial fibrillation on EKG from admission. There is one EKG on 07/31/2020 with wide complex tachycardia (due to underlying LBBB) with rates in the 130s. This appeared to be sinus tachy and follow up in office he was clearly in NSR.  He was readmitted in July with UTI and was treated with antibiotics.   Past Medical History:  Diagnosis Date   Alzheimer disease ([place])    Chronic kidney disease    mild insuffiency   Coronary artery disease    LHC 4/10: Mid LAD 40-50%, then 70%, OM1 20-30%, EF 65%. Mid LAD FFR 0.89 (not hemodynamically significant). Medical therapy was continued.   Dysuria 08/10/2020   Hyperlipidemia    LBBB (left bundle branch block) 06/2018   Obstructive sleep apnea     Past Surgical History:  Procedure Laterality Date   CARDIAC CATHETERIZATION  05/02/2003   single vessel,moderate stenosis mid LAD   CARDIAC CATHETERIZATION  06/12/2008   continue med. therapy  right shoulder surgery     TOTAL HIP ARTHROPLASTY Left 09/14/2018   Procedure: LEFT TOTAL HIP ARTHROPLASTY ANTERIOR APPROACH;  Surgeon: Mcarthur Rossetti, MD;  Location: [facility];  Service: Orthopedics;  Laterality: Left;    Current Medications: No outpatient medications have been marked as taking for the 11/27/20 encounter (Appointment) with Martinique, Bao Bazen M, MD.     Allergies:   Darifenacin, Enablex  [darifenacin hydrobromide er], Ceftriaxone, Atorvastatin, Ezetimibe, Levaquin [levofloxacin], Oxybutynin, Sulfamethoxazole, Sulfasalazine, and Sulfonamide derivatives   Social History   Socioeconomic History   Marital status: Widowed    Spouse name: Not on file   Number of children: 2   Years of education: Not on file   Highest education level: Not on file  Occupational History   Occupation: [employer]  Tobacco Use   Smoking status: Former    Types: Pipe    Quit date: 02/28/1990    Years since quitting: 30.7   Smokeless tobacco: Never   Tobacco comments:    smoked pipe only   Vaping Use   Vaping Use: Never used  Substance and Sexual Activity   Alcohol use: No    Alcohol/week: 0.0 standard drinks    Comment: rarely wine   Drug use: No   Sexual activity: Not on file  Other Topics Concern   Not on file  Social History Narrative   Not on file   Social Determinants of Health   Financial Resource Strain: Not on file  Food Insecurity: Not on file  Transportation Needs: Not on file  Physical Activity: Not on file  Stress: Not on file  Social Connections: Not on file     Family History: The patient's family history includes Cancer in his mother; Hypertension in his father and mother; Other in his father.  ROS:   Please see the history of present illness.     EKGs/Labs/Other Studies Reviewed:    The following studies were reviewed today:  Cardiac Catheterization 06/12/2008: ANGIOGRAPHIC DATA:   The left coronary artery arises and distributes normally. The left main coronary artery is short and is normal.   The left anterior descending artery has mild-to-moderate calcification. There is diffuse 40-50% disease in the mid LAD.  Within this segment of disease, there is a more focal 70% eccentric stenosis.  The first diagonal is without significant disease.   Left circumflex coronary is a large dominant vessel.  There is 20-30% plaque in the first obtuse marginal  vessel.  Otherwise, the left circumflex coronary artery appears normal.   The right coronary is a small nondominant vessel and appears normal.   Left ventricular angiography was performed in the RAO view. This demonstrates normal left ventricular size and contractility with normal systolic function.  Ejection fraction is estimated 65%.   Given the intermediate stenosis in the mid LAD and the patient's recent symptoms, we proceeded with physiologic testing of the LAD stenosis using a FloWire.  After the patient was anticoagulated, we were able to cross the lesion in the LAD easily with the FloWire.  We obtained 3 sets to hemodynamic measurements following injection of 60 mcg of intracoronary adenosine.  This yielded fractional flow reserve measurements of 0.89 and 0.88.  These findings were felt to indicate that the lesion was non-flow limiting and would be best treated medically.  We subsequently performed angiogram of the right femoral artery.  His right groin was sealed using an Angio-Seal device with excellent hemostasis.   FINAL INTERPRETATION:  1. Single-vessel atherosclerotic coronary  disease.  There is a moderate intermediate stenosis in the mid left anterior descending. Based on fractional flow reserve measurements, it is felt this is non-flow limiting.  2. Normal left ventricular function.   PLAN: Continue medical therapy. _______________  Myoview 07/20/2018: Nuclear stress EF: 63%. The left ventricular ejection fraction is normal (55-65%). There was no ST segment deviation noted during stress. The study is normal. This is a low risk study. _______________  Echocardiogram 05/22/2020: Impressions:  1. Left ventricular ejection fraction, by estimation, is 60 to 65%. The  left ventricle has normal function. The left ventricle has no regional  wall motion abnormalities. There is mild left ventricular hypertrophy.  Left ventricular diastolic parameters  are consistent with Grade I  diastolic dysfunction (impaired relaxation).   2. Right ventricular systolic function is normal. The right ventricular  size is normal.   3. The mitral valve is normal in structure. Mild mitral valve  regurgitation. No evidence of mitral stenosis.   4. The aortic valve is tricuspid. Aortic valve regurgitation is trivial.  Mild aortic valve sclerosis is present, with no evidence of aortic valve  stenosis.   5. The inferior vena cava is normal in size with greater than 50%  respiratory variability, suggesting right atrial pressure of 3 mmHg.   EKG:  EKG ordered today. EKG personally reviewed and demonstrates normal sinus rhythm, rate 82 bpm, with LBBB. No significant changes from prior tracing. Borderline 1st degree AV block. QTc 472 ms.  Recent Labs: 04/21/2020: NT-Pro BNP 100 09/11/2020: ALT 21; B Natriuretic Peptide 131.0 09/13/2020: BUN 14; Creatinine, Ser 1.19; Hemoglobin 12.3; Platelets 179; Potassium 3.8; Sodium 133  Recent Lipid Panel No results found for: CHOL, TRIG, HDL, CHOLHDL, VLDL, LDLCALC, LDLDIRECT  Physical Exam:    Vital Signs: There were no vitals taken for this visit.    Wt Readings from Last 3 Encounters:  09/12/20 204 lb 2.5 oz (92.6 kg)  09/07/20 204 lb 3.2 oz (92.6 kg)  08/10/20 204 lb (92.5 kg)     General: 80 y.o. Caucasian male in no acute distress. HEENT: Normocephalic and atraumatic. Sclera clear. EOMs intact. Neck: Supple. No carotid bruits. No JVD. Heart: RRR. Distinct S1 and S2. Soft systolic murmur noted. No gallops or rubs.  Lungs: No increased work of breathing. Clear to ausculation bilaterally. No wheezes, rhonchi, or rales.  Abdomen: Soft, obese, and non-tender to palpation.  Extremities: No lower extremity edema.    Skin: Warm and dry. Neuro: Alert and oriented x3. No focal deficits. Psych: Normal affect. Responds appropriately.  Assessment:    No diagnosis found.   Plan:    Shortness of Breath - Patient continues to have shortness of  breath with minimal activity. - Echo in 04/2020  showed LVEF of 60-65% with normal wall motion, mild LVH, and grade 1 diastolic dysfunction as well as mild MR. Symptoms felt to be secondary to deconditioning and not CHF.  - Still do not feel like symptoms are consistent with CHF. No orthopnea, PND, edema, or weigh gain. Discussed that symptoms still may be due to deconditioning. Offered stress test to make sure dyspnea is not an anginal equivalent. Patient/daughter would like to try increasing activity and starting PT to see if that helps.  Nonobstructive CAD - Noted on cardiac catheterization in 2010. Myoview in 2020 was low risk with no evidence of ischemia.  - No angina.  - Continue aspirin and statin. Currently taking Aspiring 81mg  twice daily. Will decrease this to once daily.  Hypertension -  BP well controlled. - Continue Lisinopril 20mg  daily. Also on Lasix 20mg  every day.  Hyperlipidemia - No recent lipid panel in our system or in [place]. - Patient currently on Crestor 5mg  every other day. Unclear why. Patient has been intolerant to Lipitor in the past but patient denies any myalgias with Crestor. - Continue Crestor 10mg  daily. - Patient not fasting today so have asked him to come back for fasting labs.  CKD Stage IIIa - Baseline creatinine around 1.0 to 1.30.  - Stable on most recent labs in 07/2020.   Alzheimer Disease - On Aricept.  - Followed by PCP.  Disposition: Follow up with Dr. Martinique in 10/2020.   Medication Adjustments/Labs and Tests Ordered: Current medicines are reviewed at length with the patient today.  Concerns regarding medicines are outlined above.  No orders of the defined types were placed in this encounter.   No orders of the defined types were placed in this encounter.    There are no Patient Instructions on file for this visit.   Signed, Mikaila Grunert Martinique, MD  11/25/2020 3:07 PM    [name] [name]

## 2020-11-27 ENCOUNTER — Ambulatory Visit: Payer: Medicare Other | Admitting: Cardiology

## 2020-12-29 ENCOUNTER — Emergency Department (HOSPITAL_BASED_OUTPATIENT_CLINIC_OR_DEPARTMENT_OTHER): Payer: Medicare Other | Admitting: Radiology

## 2020-12-29 ENCOUNTER — Emergency Department (HOSPITAL_BASED_OUTPATIENT_CLINIC_OR_DEPARTMENT_OTHER)
Admission: EM | Admit: 2020-12-29 | Discharge: 2020-12-29 | Disposition: A | Discharge status: Home / Self Care | Payer: Medicare Other | Attending: Emergency Medicine | Admitting: Emergency Medicine

## 2020-12-29 ENCOUNTER — Other Ambulatory Visit: Payer: Self-pay

## 2020-12-29 ENCOUNTER — Encounter (HOSPITAL_BASED_OUTPATIENT_CLINIC_OR_DEPARTMENT_OTHER): Payer: Self-pay

## 2020-12-29 DIAGNOSIS — I5032 Chronic diastolic (congestive) heart failure: Secondary | ICD-10-CM | POA: Insufficient documentation

## 2020-12-29 DIAGNOSIS — R059 Cough, unspecified: Secondary | ICD-10-CM | POA: Insufficient documentation

## 2020-12-29 DIAGNOSIS — F0280 Dementia in other diseases classified elsewhere without behavioral disturbance: Secondary | ICD-10-CM | POA: Insufficient documentation

## 2020-12-29 DIAGNOSIS — Z79899 Other long term (current) drug therapy: Secondary | ICD-10-CM | POA: Diagnosis not present

## 2020-12-29 DIAGNOSIS — N183 Chronic kidney disease, stage 3 unspecified: Secondary | ICD-10-CM | POA: Insufficient documentation

## 2020-12-29 DIAGNOSIS — G309 Alzheimer's disease, unspecified: Secondary | ICD-10-CM | POA: Insufficient documentation

## 2020-12-29 DIAGNOSIS — I2510 Atherosclerotic heart disease of native coronary artery without angina pectoris: Secondary | ICD-10-CM | POA: Diagnosis not present

## 2020-12-29 DIAGNOSIS — Z96642 Presence of left artificial hip joint: Secondary | ICD-10-CM | POA: Insufficient documentation

## 2020-12-29 DIAGNOSIS — J209 Acute bronchitis, unspecified: Secondary | ICD-10-CM

## 2020-12-29 DIAGNOSIS — Z7982 Long term (current) use of aspirin: Secondary | ICD-10-CM | POA: Diagnosis not present

## 2020-12-29 DIAGNOSIS — Z20822 Contact with and (suspected) exposure to covid-19: Secondary | ICD-10-CM | POA: Insufficient documentation

## 2020-12-29 DIAGNOSIS — R0602 Shortness of breath: Secondary | ICD-10-CM | POA: Insufficient documentation

## 2020-12-29 DIAGNOSIS — R0981 Nasal congestion: Secondary | ICD-10-CM | POA: Insufficient documentation

## 2020-12-29 DIAGNOSIS — I130 Hypertensive heart and chronic kidney disease with heart failure and stage 1 through stage 4 chronic kidney disease, or unspecified chronic kidney disease: Secondary | ICD-10-CM | POA: Insufficient documentation

## 2020-12-29 DIAGNOSIS — Z87891 Personal history of nicotine dependence: Secondary | ICD-10-CM | POA: Insufficient documentation

## 2020-12-29 DIAGNOSIS — Z85038 Personal history of other malignant neoplasm of large intestine: Secondary | ICD-10-CM | POA: Insufficient documentation

## 2020-12-29 LAB — RESP PANEL BY RT-PCR (FLU A&B, COVID) ARPGX2
Influenza A by PCR: NEGATIVE
Influenza B by PCR: NEGATIVE
SARS Coronavirus 2 by RT PCR: NEGATIVE

## 2020-12-29 MED ORDER — ALBUTEROL SULFATE HFA 108 (90 BASE) MCG/ACT IN AERS
2.0000 | INHALATION_SPRAY | Freq: Once | RESPIRATORY_TRACT | Status: AC
  Administered 2020-12-29: 2 via RESPIRATORY_TRACT
  Filled 2020-12-29: qty 6.7

## 2020-12-29 MED ORDER — PREDNISONE 20 MG PO TABS
20.0000 mg | ORAL_TABLET | Freq: Once | ORAL | Status: AC
  Administered 2020-12-29: 20 mg via ORAL
  Filled 2020-12-29: qty 1

## 2020-12-29 MED ORDER — AMOXICILLIN-POT CLAVULANATE 500-125 MG PO TABS: 1.0000 | ORAL_TABLET | Freq: Three times a day (TID) | ORAL | 0 refills | Status: DC

## 2020-12-29 MED ORDER — PREDNISONE 10 MG PO TABS: 20.0000 mg | ORAL_TABLET | Freq: Two times a day (BID) | ORAL | 0 refills | Status: DC

## 2020-12-29 MED ORDER — AMOXICILLIN-POT CLAVULANATE 875-125 MG PO TABS
1.0000 | ORAL_TABLET | Freq: Once | ORAL | Status: AC
  Administered 2020-12-29: 1 via ORAL
  Filled 2020-12-29: qty 1

## 2020-12-29 NOTE — ED Provider Notes (Signed)
[facility]ERG[facility] DEPT Provider Note   CSN: [id number] Arrival date & time: 12/29/20  1842     History Chief Complaint  Patient presents with   Cough    [name] [name] is a 80 y.o. male.  Patient is an 80 year old male with past medical history of coronary artery disease, hyperlipidemia, sleep apnea, chronic renal insufficiency.  Patient brought by daughter for evaluation of URI symptoms.  They report a 1 week history of congestion, cough, and shortness of breath.  Patient was around his daughter who was sick with what she describes as "a cold" 2 weeks ago.  Patient denies COVID exposures.  He denies chest pain or leg swelling.  The history is provided by the patient.  Cough Cough characteristics:  Productive Sputum characteristics:  Yellow Severity:  Moderate Onset quality:  Gradual Duration:  1 week Timing:  Constant Progression:  Worsening Chronicity:  New Relieved by:  Nothing Worsened by:  Nothing     Past Medical History:  Diagnosis Date   Alzheimer disease ([place])    Chronic kidney disease    mild insuffiency   Coronary artery disease    LHC 4/10: Mid LAD 40-50%, then 70%, OM1 20-30%, EF 65%. Mid LAD FFR 0.89 (not hemodynamically significant). Medical therapy was continued.   Dysuria 08/10/2020   Hyperlipidemia    LBBB (left bundle branch block) 06/2018   Obstructive sleep apnea     Patient Active Problem List   Diagnosis Date Noted   Sepsis secondary to UTI ([place]) 09/11/2020   Dysuria 08/10/2020   Acute parotitis 07/27/2020   Facial cellulitis 07/26/2020   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   Primary osteoarthritis of left hip 09/06/2018   Alzheimer's dementia ([place]) 12/07/2016   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   Venous stasis dermatitis of left lower extremity 04/07/2016   Bilateral impacted cerumen 01/18/2016   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    Unsteadiness 01/18/2016   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   Cellulitis of left lower leg    Left leg swelling    Cellulitis 08/23/2014   Chronic diastolic CHF (congestive heart failure) ([place]) 04/29/2014   DOE (dyspnea on exertion) 11/12/2011   Stage 3a chronic kidney disease (H[facility])    Thrombocytopenia, unspecified ([place]) 07/29/2010   Vitamin D deficiency 05/19/2010   Depression, major, recurrent, 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   Urinary urgency 05/19/2010   Vertigo, 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   Benign essential hypertension 12/23/2009   Actinic keratosis 12/23/2009   Seborrheic keratosis 03/18/2009   Benign neoplasm 03/18/2009   Dyslipidemia 01/18/2008   Obstructive sleep apnea 01/18/2008   Coronary atherosclerosis 01/18/2008   Atherosclerotic heart disease of native coronary artery without angina pectoris 01/18/2008   Cataract in degenerative disorder 11/21/2006   Unspecified protein-calorie malnutrition ([place]) 09/27/2006   Gastro-esophageal reflux disease with esophagitis 09/15/2006   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 therapy   right shoulder surgery     TOTAL HIP ARTHROPLASTY Left 09/14/2018   Procedure: LEFT TOTAL HIP ARTHROPLASTY ANTERIOR APPROACH;  Surgeon: Mcarthur Rossetti, MD;  Location: [facility];  Service: Orthopedics;  Laterality: Left;       Family  History  Problem Relation Age of Onset   Cancer Mother    Hypertension Mother    Other Father        old age   Hypertension Father     Social History   Tobacco Use   Smoking status: Former    Types: Pipe    Quit date: 02/28/1990    Years since quitting: 30.8   Smokeless tobacco: Never   Tobacco  comments:    smoked pipe only   Vaping Use   Vaping Use: Never used  Substance Use Topics   Alcohol use: No    Alcohol/week: 0.0 standard drinks    Comment: rarely wine   Drug use: No    Home Medications Prior to Admission medications   Medication Sig Start Date End Date Taking? Authorizing Provider  aspirin 81 MG chewable tablet Chew 1 tablet (81 mg total) by mouth daily. 09/07/20   Sande Rives E, PA-C  calcium-vitamin D (OSCAL WITH D) 500-200 MG-UNIT per tablet Take 1 tablet by mouth 2 (two) times daily.    [provider]  docusate sodium (COLACE) 100 MG capsule Take 200 mg by mouth 2 (two) times daily.    [provider]  donepezil (ARICEPT) 10 MG tablet Take 10 mg by mouth at bedtime. 12/01/16   [provider]  fesoterodine (TOVIAZ) 8 MG TB24 tablet Take 8 mg by mouth daily. 12/13/18   [provider]  fluticasone (FLONASE) 50 MCG/ACT nasal spray Place 2 sprays into both nostrils daily as needed for allergies. 05/15/17   [provider]  furosemide (LASIX) 20 MG tablet Take 1 tablet (20 mg total) by mouth daily. 09/07/20   Sande Rives E, PA-C  lisinopril (ZESTRIL) 20 MG tablet Take 20 mg by mouth daily. 08/23/18   [provider]  mirabegron ER (MYRBETRIQ) 25 MG TB24 tablet Take 25 mg by mouth daily.  08/29/18   [provider]  Misc Natural Products (OSTEO BI-FLEX/5-LOXIN ADVANCED) TABS Take 1 tablet by mouth 2 (two) times daily.    [provider]  Multiple Vitamin (MULTIVITAMIN) tablet Take 1 tablet by mouth daily.    [provider]  Omega-3 Fatty Acids (FISH OIL) 1000 MG CAPS Take 1 capsule (1,000 mg total) by mouth 2 (two) times daily. 07/31/20   Little Ishikawa, MD  Omega-3 Fatty Acids (FISH OIL) 1200 MG CAPS Take 1,200 mg by mouth daily.    [provider]  Psyllium (METAMUCIL PO) Take 3.4 g by mouth See admin instructions. Mix 3.4 grams of powder into 8 ounces of water or juice  and drink once a day    [provider]  rosuvastatin (CRESTOR) 10 MG tablet TAKE 1/2 TABLET BY MOUTH EVERY OTHER DAY 06/22/20   [place], Peter M, MD    Allergies    Darifenacin, Enablex [darifenacin hydrobromide er], Ceftriaxone, Atorvastatin, Ezetimibe, Levaquin [levofloxacin], Oxybutynin, Sulfamethoxazole, Sulfasalazine, and Sulfonamide derivatives  Review of Systems   Review of Systems  Respiratory:  Positive for cough.   All other systems reviewed and are negative.  Physical Exam Updated Vital Signs BP (!) 163/86   Pulse 79   Temp 97.8 F (36.6 C)   Resp 16   Ht 5\' 10"  (1.778 m)   Wt 92.6 kg   SpO2 97%   BMI 29.29 kg/m   Physical Exam Vitals and nursing note reviewed.  Constitutional:      General: He is not in acute distress.    Appearance: He is well-developed.  He is not diaphoretic.  HENT:     Head: Normocephalic and atraumatic.  Cardiovascular:     Rate and Rhythm: Normal rate and regular rhythm.     Heart sounds: No murmur heard.   No friction rub.  Pulmonary:     Effort: Pulmonary effort is normal. No respiratory distress.     Breath sounds: Normal breath sounds. No wheezing or rales.  Abdominal:     General: Bowel sounds are normal. There is no distension.     Palpations: Abdomen is soft.     Tenderness: There is no abdominal tenderness.  Musculoskeletal:        General: No swelling or tenderness. Normal range of motion.     Cervical back: Normal range of motion and neck supple.     Right lower leg: No edema.     Left lower leg: No edema.  Skin:    General: Skin is warm and dry.  Neurological:     Mental Status: He is alert and oriented to person, place, and time.     Coordination: Coordination normal.    ED Results / Procedures / Treatments   Labs (all labs ordered are listed, but only abnormal results are displayed) Labs Reviewed  RESP PANEL BY RT-PCR (FLU A&B, COVID) ARPGX2    EKG None  Radiology DG Chest 2 View  Result Date:  12/29/2020 CLINICAL DATA:  Cough with congestion. EXAM: CHEST - 2 VIEW COMPARISON:  Chest x-ray 09/11/2020. FINDINGS: Heart is enlarged. There is linear atelectasis in the left lung base. There is no pleural effusion or pneumothorax. Degenerative changes affect the spine and shoulders. IMPRESSION: 1. Left basilar atelectasis. 2. Stable cardiomegaly. Electronically Signed   By: Ronney Asters M.D.   On: 12/29/2020 20:34    Procedures Procedures   Medications Ordered in ED Medications  amoxicillin-clavulanate (AUGMENTIN) 875-125 MG per tablet 1 tablet (has no administration in time range)  predniSONE (DELTASONE) tablet 20 mg (has no administration in time range)  albuterol (VENTOLIN HFA) 108 (90 Base) MCG/ACT inhaler 2 puff (has no administration in time range)    ED Course  I have reviewed the triage vital signs and the nursing notes.  Pertinent labs & imaging results that were available during my care of the patient were reviewed by me and considered in my medical decision making (see chart for details).    MDM Rules/Calculators/A&P  Patient presenting with a 1 week history of worsening URI symptoms.  COVID and flu tests are negative and chest x-ray shows atelectasis but no definitive infiltrate.  Patient also has a history of recurrent parotitis and also reports some swelling to the left cheek consistent with a recurrence of this.  Patient's vitals are stable and is afebrile.  Oxygen saturations are ranging between 96 and 100% with no respiratory distress.  Patient seems appropriate for discharge.  I will prescribe Augmentin, prednisone, and given albuterol inhaler.  Patient to return if symptoms worsen.  Final Clinical Impression(s) / ED Diagnoses Final diagnoses:  None    Rx / DC Orders ED Discharge Orders     None        Veryl Speak, MD 12/29/20 2314

## 2020-12-29 NOTE — Discharge Instructions (Addendum)
Begin taking Augmentin and prednisone as prescribed.  Use the albuterol inhaler, 2 puffs every 4 hours as needed for wheezing/difficulty breathing.  Return to the emergency department if you develop severe chest pain, difficulty breathing, or other new and concerning symptoms.

## 2020-12-29 NOTE — ED Triage Notes (Signed)
Patient here POV from Home with Family for Cough, Congestion,   Symptoms have been present since this Weekend. No Known Sick Contacts.  Patient has been taking PO Antibiotics (Clindamycin) recently for "Parotid Gland Swelling"  NAD Noted during Triage. A&Ox4. GCS 15. BIB Wheelchair. Lung Sounds Clear During Triage. No Oral Airway Compromise.

## 2021-01-07 ENCOUNTER — Other Ambulatory Visit: Payer: Self-pay

## 2021-01-07 ENCOUNTER — Inpatient Hospital Stay (HOSPITAL_BASED_OUTPATIENT_CLINIC_OR_DEPARTMENT_OTHER)
Admission: EM | Admit: 2021-01-07 | Discharge: 2021-01-10 | DRG: 871 | Disposition: A | Discharge status: Home / Self Care | Payer: Medicare Other | Attending: Student | Admitting: Student

## 2021-01-07 ENCOUNTER — Emergency Department (HOSPITAL_BASED_OUTPATIENT_CLINIC_OR_DEPARTMENT_OTHER): Payer: Medicare Other | Admitting: Radiology

## 2021-01-07 DIAGNOSIS — G9341 Metabolic encephalopathy: Secondary | ICD-10-CM | POA: Diagnosis present

## 2021-01-07 DIAGNOSIS — I2510 Atherosclerotic heart disease of native coronary artery without angina pectoris: Secondary | ICD-10-CM | POA: Diagnosis present

## 2021-01-07 DIAGNOSIS — R6520 Severe sepsis without septic shock: Secondary | ICD-10-CM | POA: Diagnosis present

## 2021-01-07 DIAGNOSIS — E871 Hypo-osmolality and hyponatremia: Secondary | ICD-10-CM | POA: Diagnosis present

## 2021-01-07 DIAGNOSIS — E785 Hyperlipidemia, unspecified: Secondary | ICD-10-CM | POA: Diagnosis present

## 2021-01-07 DIAGNOSIS — F0280 Dementia in other diseases classified elsewhere without behavioral disturbance: Secondary | ICD-10-CM | POA: Diagnosis present

## 2021-01-07 DIAGNOSIS — Z79899 Other long term (current) drug therapy: Secondary | ICD-10-CM

## 2021-01-07 DIAGNOSIS — N1831 Chronic kidney disease, stage 3a: Secondary | ICD-10-CM | POA: Diagnosis present

## 2021-01-07 DIAGNOSIS — Z9989 Dependence on other enabling machines and devices: Secondary | ICD-10-CM

## 2021-01-07 DIAGNOSIS — U071 COVID-19: Secondary | ICD-10-CM | POA: Diagnosis present

## 2021-01-07 DIAGNOSIS — Z96642 Presence of left artificial hip joint: Secondary | ICD-10-CM | POA: Diagnosis present

## 2021-01-07 DIAGNOSIS — Z20822 Contact with and (suspected) exposure to covid-19: Secondary | ICD-10-CM | POA: Diagnosis present

## 2021-01-07 DIAGNOSIS — Z7982 Long term (current) use of aspirin: Secondary | ICD-10-CM

## 2021-01-07 DIAGNOSIS — R7401 Elevation of levels of liver transaminase levels: Secondary | ICD-10-CM | POA: Diagnosis present

## 2021-01-07 DIAGNOSIS — A419 Sepsis, unspecified organism: Secondary | ICD-10-CM | POA: Diagnosis not present

## 2021-01-07 DIAGNOSIS — Z87891 Personal history of nicotine dependence: Secondary | ICD-10-CM

## 2021-01-07 DIAGNOSIS — Z888 Allergy status to other drugs, medicaments and biological substances status: Secondary | ICD-10-CM

## 2021-01-07 DIAGNOSIS — K1120 Sialoadenitis, unspecified: Secondary | ICD-10-CM | POA: Diagnosis not present

## 2021-01-07 DIAGNOSIS — L089 Local infection of the skin and subcutaneous tissue, unspecified: Secondary | ICD-10-CM | POA: Diagnosis present

## 2021-01-07 DIAGNOSIS — I130 Hypertensive heart and chronic kidney disease with heart failure and stage 1 through stage 4 chronic kidney disease, or unspecified chronic kidney disease: Secondary | ICD-10-CM | POA: Diagnosis present

## 2021-01-07 DIAGNOSIS — Z7952 Long term (current) use of systemic steroids: Secondary | ICD-10-CM

## 2021-01-07 DIAGNOSIS — G9349 Other encephalopathy: Secondary | ICD-10-CM | POA: Diagnosis present

## 2021-01-07 DIAGNOSIS — I5032 Chronic diastolic (congestive) heart failure: Secondary | ICD-10-CM | POA: Diagnosis present

## 2021-01-07 DIAGNOSIS — N179 Acute kidney failure, unspecified: Secondary | ICD-10-CM | POA: Diagnosis present

## 2021-01-07 DIAGNOSIS — R4182 Altered mental status, unspecified: Secondary | ICD-10-CM

## 2021-01-07 DIAGNOSIS — Z8249 Family history of ischemic heart disease and other diseases of the circulatory system: Secondary | ICD-10-CM

## 2021-01-07 DIAGNOSIS — G4733 Obstructive sleep apnea (adult) (pediatric): Secondary | ICD-10-CM

## 2021-01-07 DIAGNOSIS — J9811 Atelectasis: Secondary | ICD-10-CM | POA: Diagnosis present

## 2021-01-07 DIAGNOSIS — Z882 Allergy status to sulfonamides status: Secondary | ICD-10-CM

## 2021-01-07 DIAGNOSIS — G309 Alzheimer's disease, unspecified: Secondary | ICD-10-CM | POA: Diagnosis present

## 2021-01-07 LAB — CBC WITH DIFFERENTIAL/PLATELET
Abs Immature Granulocytes: 0.09 10*3/uL — ABNORMAL HIGH (ref 0.00–0.07)
Basophils Absolute: 0 10*3/uL (ref 0.0–0.1)
Basophils Relative: 0 %
Eosinophils Absolute: 0.1 10*3/uL (ref 0.0–0.5)
Eosinophils Relative: 1 %
HCT: 45.1 % (ref 39.0–52.0)
Hemoglobin: 15.1 g/dL (ref 13.0–17.0)
Immature Granulocytes: 1 %
Lymphocytes Relative: 9 %
Lymphs Abs: 1.2 10*3/uL (ref 0.7–4.0)
MCH: 30.9 pg (ref 26.0–34.0)
MCHC: 33.5 g/dL (ref 30.0–36.0)
MCV: 92.2 fL (ref 80.0–100.0)
Monocytes Absolute: 1.3 10*3/uL — ABNORMAL HIGH (ref 0.1–1.0)
Monocytes Relative: 10 %
Neutro Abs: 11 10*3/uL — ABNORMAL HIGH (ref 1.7–7.7)
Neutrophils Relative %: 79 %
Platelets: 246 10*3/uL (ref 150–400)
RBC: 4.89 MIL/uL (ref 4.22–5.81)
RDW: 13.1 % (ref 11.5–15.5)
WBC: 13.7 10*3/uL — ABNORMAL HIGH (ref 4.0–10.5)
nRBC: 0 % (ref 0.0–0.2)

## 2021-01-07 LAB — COMPREHENSIVE METABOLIC PANEL
ALT: 57 U/L — ABNORMAL HIGH (ref 0–44)
AST: 37 U/L (ref 15–41)
Albumin: 4.2 g/dL (ref 3.5–5.0)
Alkaline Phosphatase: 64 U/L (ref 38–126)
Anion gap: 12 (ref 5–15)
BUN: 22 mg/dL (ref 8–23)
CO2: 25 mmol/L (ref 22–32)
Calcium: 10.2 mg/dL (ref 8.9–10.3)
Chloride: 99 mmol/L (ref 98–111)
Creatinine, Ser: 1.06 mg/dL (ref 0.61–1.24)
GFR, Estimated: >60 mL/min (ref >60)
Glucose, Bld: 132 mg/dL — ABNORMAL HIGH (ref 70–99)
Potassium: 3.9 mmol/L (ref 3.5–5.1)
Sodium: 136 mmol/L (ref 135–145)
Total Bilirubin: 0.9 mg/dL (ref 0.3–1.2)
Total Protein: 7.7 g/dL (ref 6.5–8.1)

## 2021-01-07 LAB — RESP PANEL BY RT-PCR (FLU A&B, COVID) ARPGX2
Influenza A by PCR: NEGATIVE
Influenza B by PCR: NEGATIVE
SARS Coronavirus 2 by RT PCR: NEGATIVE

## 2021-01-07 LAB — LACTIC ACID, PLASMA: Lactic Acid, Venous: 2.4 mmol/L (ref 0.5–1.9)

## 2021-01-07 NOTE — ED Triage Notes (Signed)
Patient presents to the ER for fever and agitation. Patient reportedly has had sore throat and had been on antibiotics recently

## 2021-01-08 ENCOUNTER — Other Ambulatory Visit: Payer: Self-pay

## 2021-01-08 ENCOUNTER — Encounter (HOSPITAL_BASED_OUTPATIENT_CLINIC_OR_DEPARTMENT_OTHER): Payer: Self-pay | Admitting: Emergency Medicine

## 2021-01-08 ENCOUNTER — Inpatient Hospital Stay (HOSPITAL_COMMUNITY): Payer: Medicare Other

## 2021-01-08 DIAGNOSIS — R6520 Severe sepsis without septic shock: Secondary | ICD-10-CM | POA: Diagnosis present

## 2021-01-08 DIAGNOSIS — Z87891 Personal history of nicotine dependence: Secondary | ICD-10-CM | POA: Diagnosis not present

## 2021-01-08 DIAGNOSIS — N179 Acute kidney failure, unspecified: Secondary | ICD-10-CM | POA: Diagnosis present

## 2021-01-08 DIAGNOSIS — N1831 Chronic kidney disease, stage 3a: Secondary | ICD-10-CM | POA: Diagnosis present

## 2021-01-08 DIAGNOSIS — F0280 Dementia in other diseases classified elsewhere without behavioral disturbance: Secondary | ICD-10-CM | POA: Diagnosis present

## 2021-01-08 DIAGNOSIS — G309 Alzheimer's disease, unspecified: Secondary | ICD-10-CM | POA: Diagnosis present

## 2021-01-08 DIAGNOSIS — G308 Other Alzheimer's disease: Secondary | ICD-10-CM | POA: Diagnosis not present

## 2021-01-08 DIAGNOSIS — J9811 Atelectasis: Secondary | ICD-10-CM | POA: Diagnosis present

## 2021-01-08 DIAGNOSIS — E871 Hypo-osmolality and hyponatremia: Secondary | ICD-10-CM | POA: Diagnosis present

## 2021-01-08 DIAGNOSIS — L089 Local infection of the skin and subcutaneous tissue, unspecified: Secondary | ICD-10-CM

## 2021-01-08 DIAGNOSIS — G9341 Metabolic encephalopathy: Secondary | ICD-10-CM | POA: Diagnosis present

## 2021-01-08 DIAGNOSIS — I2510 Atherosclerotic heart disease of native coronary artery without angina pectoris: Secondary | ICD-10-CM | POA: Diagnosis present

## 2021-01-08 DIAGNOSIS — Z888 Allergy status to other drugs, medicaments and biological substances status: Secondary | ICD-10-CM | POA: Diagnosis not present

## 2021-01-08 DIAGNOSIS — I130 Hypertensive heart and chronic kidney disease with heart failure and stage 1 through stage 4 chronic kidney disease, or unspecified chronic kidney disease: Secondary | ICD-10-CM | POA: Diagnosis present

## 2021-01-08 DIAGNOSIS — R7401 Elevation of levels of liver transaminase levels: Secondary | ICD-10-CM

## 2021-01-08 DIAGNOSIS — Z8249 Family history of ischemic heart disease and other diseases of the circulatory system: Secondary | ICD-10-CM | POA: Diagnosis not present

## 2021-01-08 DIAGNOSIS — G9349 Other encephalopathy: Secondary | ICD-10-CM | POA: Diagnosis present

## 2021-01-08 DIAGNOSIS — Z96642 Presence of left artificial hip joint: Secondary | ICD-10-CM | POA: Diagnosis present

## 2021-01-08 DIAGNOSIS — G4733 Obstructive sleep apnea (adult) (pediatric): Secondary | ICD-10-CM | POA: Diagnosis present

## 2021-01-08 DIAGNOSIS — Z882 Allergy status to sulfonamides status: Secondary | ICD-10-CM | POA: Diagnosis not present

## 2021-01-08 DIAGNOSIS — U071 COVID-19: Secondary | ICD-10-CM | POA: Diagnosis present

## 2021-01-08 DIAGNOSIS — Z79899 Other long term (current) drug therapy: Secondary | ICD-10-CM | POA: Diagnosis not present

## 2021-01-08 DIAGNOSIS — Z20822 Contact with and (suspected) exposure to covid-19: Secondary | ICD-10-CM | POA: Diagnosis present

## 2021-01-08 DIAGNOSIS — Z7952 Long term (current) use of systemic steroids: Secondary | ICD-10-CM | POA: Diagnosis not present

## 2021-01-08 DIAGNOSIS — E785 Hyperlipidemia, unspecified: Secondary | ICD-10-CM | POA: Diagnosis present

## 2021-01-08 DIAGNOSIS — A419 Sepsis, unspecified organism: Secondary | ICD-10-CM | POA: Diagnosis not present

## 2021-01-08 DIAGNOSIS — Z7982 Long term (current) use of aspirin: Secondary | ICD-10-CM | POA: Diagnosis not present

## 2021-01-08 DIAGNOSIS — I5032 Chronic diastolic (congestive) heart failure: Secondary | ICD-10-CM | POA: Diagnosis present

## 2021-01-08 DIAGNOSIS — Z9989 Dependence on other enabling machines and devices: Secondary | ICD-10-CM

## 2021-01-08 DIAGNOSIS — K1120 Sialoadenitis, unspecified: Secondary | ICD-10-CM | POA: Diagnosis present

## 2021-01-08 LAB — COMPREHENSIVE METABOLIC PANEL
ALT: 77 U/L — ABNORMAL HIGH (ref 0–44)
AST: 55 U/L — ABNORMAL HIGH (ref 15–41)
Albumin: 2.9 g/dL — ABNORMAL LOW (ref 3.5–5.0)
Alkaline Phosphatase: 55 U/L (ref 38–126)
Anion gap: 8 (ref 5–15)
BUN: 18 mg/dL (ref 8–23)
CO2: 27 mmol/L (ref 22–32)
Calcium: 9.5 mg/dL (ref 8.9–10.3)
Chloride: 100 mmol/L (ref 98–111)
Creatinine, Ser: 1.14 mg/dL (ref 0.61–1.24)
GFR, Estimated: >60 mL/min (ref >60)
Glucose, Bld: 134 mg/dL — ABNORMAL HIGH (ref 70–99)
Potassium: 3.8 mmol/L (ref 3.5–5.1)
Sodium: 135 mmol/L (ref 135–145)
Total Bilirubin: 1.5 mg/dL — ABNORMAL HIGH (ref 0.3–1.2)
Total Protein: 6.3 g/dL — ABNORMAL LOW (ref 6.5–8.1)

## 2021-01-08 LAB — CBC WITH DIFFERENTIAL/PLATELET
Abs Immature Granulocytes: 0.05 10*3/uL (ref 0.00–0.07)
Basophils Absolute: 0 10*3/uL (ref 0.0–0.1)
Basophils Relative: 0 %
Eosinophils Absolute: 0.1 10*3/uL (ref 0.0–0.5)
Eosinophils Relative: 1 %
HCT: 40.7 % (ref 39.0–52.0)
Hemoglobin: 13.5 g/dL (ref 13.0–17.0)
Immature Granulocytes: 0 %
Lymphocytes Relative: 12 %
Lymphs Abs: 1.4 10*3/uL (ref 0.7–4.0)
MCH: 30.9 pg (ref 26.0–34.0)
MCHC: 33.2 g/dL (ref 30.0–36.0)
MCV: 93.1 fL (ref 80.0–100.0)
Monocytes Absolute: 1.1 10*3/uL — ABNORMAL HIGH (ref 0.1–1.0)
Monocytes Relative: 10 %
Neutro Abs: 8.8 10*3/uL — ABNORMAL HIGH (ref 1.7–7.7)
Neutrophils Relative %: 77 %
Platelets: 246 10*3/uL (ref 150–400)
RBC: 4.37 MIL/uL (ref 4.22–5.81)
RDW: 12.9 % (ref 11.5–15.5)
WBC: 11.4 10*3/uL — ABNORMAL HIGH (ref 4.0–10.5)
nRBC: 0 % (ref 0.0–0.2)

## 2021-01-08 LAB — URINALYSIS, ROUTINE W REFLEX MICROSCOPIC
Bilirubin Urine: NEGATIVE
Glucose, UA: NEGATIVE mg/dL
Hgb urine dipstick: NEGATIVE
Ketones, ur: NEGATIVE mg/dL
Leukocytes,Ua: NEGATIVE
Nitrite: NEGATIVE
Protein, ur: NEGATIVE mg/dL
Specific Gravity, Urine: 1.018 (ref 1.005–1.030)
pH: 6.5 (ref 5.0–8.0)

## 2021-01-08 LAB — LACTIC ACID, PLASMA: Lactic Acid, Venous: 1.4 mmol/L (ref 0.5–1.9)

## 2021-01-08 LAB — PROTIME-INR
INR: 1.1 (ref 0.8–1.2)
Prothrombin Time: 14.1 seconds (ref 11.4–15.2)

## 2021-01-08 LAB — BRAIN NATRIURETIC PEPTIDE
B Natriuretic Peptide: 74.8 pg/mL (ref 0.0–100.0)
B Natriuretic Peptide: 61.4 pg/mL (ref 0.0–100.0)

## 2021-01-08 LAB — APTT: aPTT: 28 seconds (ref 24–36)

## 2021-01-08 LAB — MRSA NEXT GEN BY PCR, NASAL: MRSA by PCR Next Gen: NOT DETECTED

## 2021-01-08 MED ORDER — PIPERACILLIN-TAZOBACTAM 3.375 G IVPB 30 MIN: 3.3750 g | Freq: Three times a day (TID) | INTRAVENOUS | Status: DC

## 2021-01-08 MED ORDER — ROSUVASTATIN CALCIUM 5 MG PO TABS
5.0000 mg | ORAL_TABLET | ORAL | Status: DC
  Administered 2021-01-08 – 2021-01-10 (×2): 5 mg via ORAL
  Filled 2021-01-08 (×3): qty 1

## 2021-01-08 MED ORDER — VANCOMYCIN HCL IN DEXTROSE 1-5 GM/200ML-% IV SOLN
1000.0000 mg | Freq: Once | INTRAVENOUS | Status: AC
  Administered 2021-01-08: 1000 mg via INTRAVENOUS
  Filled 2021-01-08: qty 200

## 2021-01-08 MED ORDER — FLUTICASONE PROPIONATE 50 MCG/ACT NA SUSP: 2.0000 | Freq: Every day | NASAL | Status: DC | PRN

## 2021-01-08 MED ORDER — ACETAMINOPHEN 500 MG PO TABS
1000.0000 mg | ORAL_TABLET | Freq: Once | ORAL | Status: AC
  Administered 2021-01-08: 1000 mg via ORAL
  Filled 2021-01-08: qty 2

## 2021-01-08 MED ORDER — PIPERACILLIN-TAZOBACTAM 3.375 G IVPB
3.3750 g | Freq: Three times a day (TID) | INTRAVENOUS | Status: DC
  Administered 2021-01-08 – 2021-01-10 (×6): 3.375 g via INTRAVENOUS
  Filled 2021-01-08 (×7): qty 50

## 2021-01-08 MED ORDER — ONDANSETRON HCL 4 MG/2ML IJ SOLN: 4.0000 mg | Freq: Four times a day (QID) | INTRAMUSCULAR | Status: DC | PRN

## 2021-01-08 MED ORDER — ENOXAPARIN SODIUM 40 MG/0.4ML IJ SOSY
40.0000 mg | PREFILLED_SYRINGE | INTRAMUSCULAR | Status: DC
  Administered 2021-01-08 – 2021-01-10 (×3): 40 mg via SUBCUTANEOUS
  Filled 2021-01-08 (×3): qty 0.4

## 2021-01-08 MED ORDER — MIRABEGRON ER 25 MG PO TB24
50.0000 mg | ORAL_TABLET | Freq: Every morning | ORAL | Status: DC
  Administered 2021-01-09: 50 mg via ORAL
  Filled 2021-01-08: qty 2

## 2021-01-08 MED ORDER — ONDANSETRON HCL 4 MG PO TABS: 4.0000 mg | ORAL_TABLET | Freq: Four times a day (QID) | ORAL | Status: DC | PRN

## 2021-01-08 MED ORDER — GUAIFENESIN ER 600 MG PO TB12
600.0000 mg | ORAL_TABLET | Freq: Two times a day (BID) | ORAL | Status: DC
  Administered 2021-01-08 – 2021-01-10 (×4): 600 mg via ORAL
  Filled 2021-01-08 (×5): qty 1

## 2021-01-08 MED ORDER — LISINOPRIL 20 MG PO TABS: 20.0000 mg | ORAL_TABLET | Freq: Every morning | ORAL | Status: DC

## 2021-01-08 MED ORDER — DOCUSATE SODIUM 100 MG PO CAPS
200.0000 mg | ORAL_CAPSULE | Freq: Two times a day (BID) | ORAL | Status: DC
  Administered 2021-01-08 – 2021-01-10 (×5): 200 mg via ORAL
  Filled 2021-01-08 (×5): qty 2

## 2021-01-08 MED ORDER — FESOTERODINE FUMARATE ER 8 MG PO TB24
8.0000 mg | ORAL_TABLET | Freq: Every morning | ORAL | Status: DC
  Administered 2021-01-09 – 2021-01-10 (×2): 8 mg via ORAL
  Filled 2021-01-08 (×2): qty 1

## 2021-01-08 MED ORDER — LACTATED RINGERS IV SOLN: INTRAVENOUS | Status: DC

## 2021-01-08 MED ORDER — DEXAMETHASONE SODIUM PHOSPHATE 10 MG/ML IJ SOLN
8.0000 mg | Freq: Three times a day (TID) | INTRAMUSCULAR | Status: AC
  Administered 2021-01-08 – 2021-01-09 (×3): 8 mg via INTRAVENOUS
  Filled 2021-01-08 (×3): qty 0.8

## 2021-01-08 MED ORDER — ACETAMINOPHEN 650 MG RE SUPP: 650.0000 mg | Freq: Four times a day (QID) | RECTAL | Status: DC | PRN

## 2021-01-08 MED ORDER — ACETAMINOPHEN 325 MG PO TABS: 650.0000 mg | ORAL_TABLET | Freq: Four times a day (QID) | ORAL | Status: DC | PRN

## 2021-01-08 MED ORDER — PIPERACILLIN-TAZOBACTAM 3.375 G IVPB 30 MIN
3.3750 g | Freq: Once | INTRAVENOUS | Status: AC
  Administered 2021-01-08: 3.375 g via INTRAVENOUS
  Filled 2021-01-08: qty 50

## 2021-01-08 MED ORDER — IOHEXOL 300 MG/ML  SOLN
75.0000 mL | Freq: Once | INTRAMUSCULAR | Status: AC | PRN
  Administered 2021-01-08: 75 mL via INTRAVENOUS

## 2021-01-08 MED ORDER — ASPIRIN 81 MG PO CHEW
81.0000 mg | CHEWABLE_TABLET | Freq: Every day | ORAL | Status: DC
  Administered 2021-01-08 – 2021-01-10 (×3): 81 mg via ORAL
  Filled 2021-01-08 (×3): qty 1

## 2021-01-08 MED ORDER — SODIUM CHLORIDE 0.9 % IV SOLN: Freq: Once | INTRAVENOUS | Status: AC

## 2021-01-08 MED ORDER — ALBUTEROL SULFATE (2.5 MG/3ML) 0.083% IN NEBU: 2.5000 mg | INHALATION_SOLUTION | Freq: Four times a day (QID) | RESPIRATORY_TRACT | Status: DC | PRN

## 2021-01-08 MED ORDER — PSYLLIUM 95 % PO PACK
1.0000 | PACK | Freq: Every day | ORAL | Status: DC
  Administered 2021-01-08 – 2021-01-09 (×2): 1 via ORAL
  Filled 2021-01-08 (×2): qty 1

## 2021-01-08 MED ORDER — DONEPEZIL HCL 10 MG PO TABS
10.0000 mg | ORAL_TABLET | Freq: Every day | ORAL | Status: DC
  Administered 2021-01-08 – 2021-01-09 (×2): 10 mg via ORAL
  Filled 2021-01-08 (×2): qty 1

## 2021-01-08 MED ORDER — FUROSEMIDE 20 MG PO TABS
20.0000 mg | ORAL_TABLET | Freq: Every day | ORAL | Status: DC
  Administered 2021-01-08: 20 mg via ORAL
  Filled 2021-01-08: qty 1

## 2021-01-08 MED ORDER — VANCOMYCIN HCL 1000 MG/200ML IV SOLN
1000.0000 mg | Freq: Two times a day (BID) | INTRAVENOUS | Status: DC
Start: 2021-01-08 — End: 2021-01-09
  Administered 2021-01-08 – 2021-01-09 (×2): 1000 mg via INTRAVENOUS
  Filled 2021-01-08 (×3): qty 200

## 2021-01-08 MED ORDER — SODIUM CHLORIDE 0.9% FLUSH
3.0000 mL | Freq: Two times a day (BID) | INTRAVENOUS | Status: DC
  Administered 2021-01-08 – 2021-01-09 (×4): 3 mL via INTRAVENOUS

## 2021-01-08 MED ORDER — NITROFURANTOIN MONOHYD MACRO 100 MG PO CAPS
100.0000 mg | ORAL_CAPSULE | Freq: Every morning | ORAL | Status: DC
  Administered 2021-01-09: 100 mg via ORAL
  Filled 2021-01-08: qty 1

## 2021-01-08 NOTE — Sepsis Progress Note (Signed)
Monitoring for the code sepsis protocol. °

## 2021-01-08 NOTE — H&P (Signed)
Needs History and Physical    [name] [name]:[id number] DOB: June 07, 1940 DOA: 01/07/2021  Referring MD/NP/PA: Kristopher Oppenheim, DO PCP: Chesley Noon, MD  Patient coming from:[facility] transfer  Chief Complaint: Agitation  I have personally briefly reviewed patient's old medical records in [place]   HPI: [name] [name] is a 80 y.o. 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  History is obtained from the patient with assistance of his daughter over the phone due to his history of dementia.  He had just been seen in the ED on 11/1 with complaints of cough, congestion, and shortness of breath.  Treated for suspected bronchitis with course of Augmentin and prednisone taper.  Patient reports that he continued to have a sore throat and productive cough.  He states that the swelling of his left jaw has been present for a week, but his daughter states that she just noticed it yesterday evening.  Around 7 PM she got him up and states that she had tried to give him a dose of clindamycin which she had taken previously for similar symptoms, but the patient became agitated and refused.  It is not like him to be agitated like this at baseline, but had previously shown similar symptoms when sick.  She notes that he had been hospitalized in May and in June with parotiditis.  Work-up included previous CT imaging without contrast and MRI which did not note any signs of a mass or abscess.  He is followed in the outpatient setting by Dr. Wilburn Cornelia.  To their knowledge he had not had a fever at home, but was noted to have 1 in the emergency department.  She also states that his breathing seemed to be labored  ED Course: Upon admission into the emergency department patient was seen to be febrile up to 102 F, blood pressures 129/66-176/83, and all other vital signs  maintained.  Labs significant for WBC 13.7, ALT 57, and lactic acid 2.4-> 1.4.  Urinalysis was negative for signs of infection.  Chest x-ray noted mild cardiomegaly and central vascular congestion.  Blood and urine cultures had been obtained.  Patient was given acetaminophen, vancomycin, and Zosyn.  TRH called to admit.  Review of Systems  Unable to perform ROS: Dementia  Constitutional:  Positive for malaise/fatigue. Negative for fever.  HENT:  Positive for congestion and sore throat.   Eyes:  Negative for photophobia.  Respiratory:  Positive for cough and shortness of breath.   Cardiovascular:  Negative for chest pain and leg swelling.  Gastrointestinal:  Negative for abdominal pain, diarrhea and vomiting.  Genitourinary:  Negative for dysuria and hematuria.  Skin:  Negative for rash.  Neurological:  Negative for loss of consciousness.  Endo/Heme/Allergies:  Does not bruise/bleed easily.  Psychiatric/Behavioral:  Positive for memory loss. Negative for substance abuse.    Past Medical History:  Diagnosis Date   Alzheimer disease ([place])    Chronic kidney disease    mild insuffiency   Coronary artery disease    LHC 4/10: Mid LAD 40-50%, then 70%, OM1 20-30%, EF 65%. Mid LAD FFR 0.89 (not hemodynamically significant). Medical therapy was continued.   Dysuria 08/10/2020   Hyperlipidemia    LBBB (left bundle branch block) 06/2018   Obstructive sleep apnea     Past Surgical History:  Procedure Laterality Date   CARDIAC CATHETERIZATION  05/02/2003   single  vessel,moderate stenosis mid LAD   CARDIAC CATHETERIZATION  06/12/2008   continue med. therapy   right shoulder surgery     TOTAL HIP ARTHROPLASTY Left 09/14/2018   Procedure: LEFT TOTAL HIP ARTHROPLASTY ANTERIOR APPROACH;  Surgeon: Mcarthur Rossetti, MD;  Location: [facility];  Service: Orthopedics;  Laterality: Left;     reports that he quit smoking about 30 years ago. His smoking use included pipe. He has never used smokeless  tobacco. He reports that he does not drink alcohol and does not use drugs.  Allergies  Allergen Reactions   Darifenacin Other (See Comments)    PT STATES IT MAKES HIM "BONKERS" AND VERY TIRED   Enablex [Darifenacin Hydrobromide Er]     PT STATES IT MAKES HIM "BONKERS" AND VERY TIRED   Ceftriaxone Other (See Comments)    Reaction??   Atorvastatin Other (See Comments)    Muscle aches/weakness   Ezetimibe Other (See Comments)    Muscle aches/weakness   Levaquin [Levofloxacin] Diarrhea   Oxybutynin Other (See Comments)    Memory impairment, temperament changes   Sulfamethoxazole Other (See Comments)    Childhood allergy   Sulfasalazine Other (See Comments)    Childhood allergy   Sulfonamide Derivatives Other (See Comments)    Childhood allergy    Family History  Problem Relation Age of Onset   Cancer Mother    Hypertension Mother    Other Father        old age   Hypertension Father     Prior to Admission medications   Medication Sig Start Date End Date Taking? Authorizing Provider  amoxicillin-clavulanate (AUGMENTIN) 500-125 MG tablet Take 1 tablet (500 mg total) by mouth every 8 (eight) hours. 12/29/20   Veryl Speak, MD  aspirin 81 MG chewable tablet Chew 1 tablet (81 mg total) by mouth daily. 09/07/20   Sande Rives E, PA-C  calcium-vitamin D (OSCAL WITH D) 500-200 MG-UNIT per tablet Take 1 tablet by mouth 2 (two) times daily.    [provider]  docusate sodium (COLACE) 100 MG capsule Take 200 mg by mouth 2 (two) times daily.    [provider]  donepezil (ARICEPT) 10 MG tablet Take 10 mg by mouth at bedtime. 12/01/16   [provider]  fesoterodine (TOVIAZ) 8 MG TB24 tablet Take 8 mg by mouth daily. 12/13/18   [provider]  fluticasone (FLONASE) 50 MCG/ACT nasal spray Place 2 sprays into both nostrils daily as needed for allergies. 05/15/17   [provider]  furosemide (LASIX) 20 MG tablet Take 1 tablet (20 mg total) by  mouth daily. 09/07/20   Sande Rives E, PA-C  lisinopril (ZESTRIL) 20 MG tablet Take 20 mg by mouth daily. 08/23/18   [provider]  mirabegron ER (MYRBETRIQ) 25 MG TB24 tablet Take 25 mg by mouth daily.  08/29/18   [provider]  Misc Natural Products (OSTEO BI-FLEX/5-LOXIN ADVANCED) TABS Take 1 tablet by mouth 2 (two) times daily.    [provider]  Multiple Vitamin (MULTIVITAMIN) tablet Take 1 tablet by mouth daily.    [provider]  Omega-3 Fatty Acids (FISH OIL) 1000 MG CAPS Take 1 capsule (1,000 mg total) by mouth 2 (two) times daily. 07/31/20   Little Ishikawa, MD  Omega-3 Fatty Acids (FISH OIL) 1200 MG CAPS Take 1,200 mg by mouth daily.    [provider]  predniSONE (DELTASONE) 10 MG tablet Take 2 tablets (20 mg total) by mouth 2 (two) times daily with  a meal. 12/29/20   Veryl Speak, MD  Psyllium (METAMUCIL PO) Take 3.4 g by mouth See admin instructions. Mix 3.4 grams of powder into 8 ounces of water or juice and drink once a day    [provider]  rosuvastatin (CRESTOR) 10 MG tablet TAKE 1/2 TABLET BY MOUTH EVERY OTHER DAY 06/22/20   Martinique, Peter M, MD    Physical Exam:  Constitutional: Elderly male who appears to be in no acute distress at this time Vitals:   01/08/21 0400 01/08/21 0415 01/08/21 0421 01/08/21 0525  BP: 130/75 129/66  135/68  Pulse: 71 68  69  Resp:    18  Temp:   98.4 F (36.9 C) 98.7 F (37.1 C)  TempSrc:   Oral Oral  SpO2: 98% 97%  97%  Weight:    91.7 kg  Height:    5\' 10"  (1.778 m)   Eyes: PERRL, lids and conjunctivae normal ENMT: Mucous membranes are moist.  Suboptimal visualization of the posterior oropharynx.  Possible poor dentition with discoloration noted of the teeth.  Swelling noted of the left maxillary region. Neck: Left submandibular lymphadenopathy. Respiratory: clear to auscultation bilaterally, no wheezing, no crackles. Normal respiratory effort. No accessory muscle use.   Cardiovascular: Regular rate and rhythm, no murmurs / rubs / gallops.  Trace lower extremity edema. 2+ pedal pulses. No carotid bruits.  Abdomen: no tenderness, no masses palpated. No hepatosplenomegaly. Bowel sounds positive.  Musculoskeletal: no clubbing / cyanosis. No joint deformity upper and lower extremities. Good ROM, no contractures. Normal muscle tone.  Skin: no rashes, lesions, ulcers. No induration Neurologic: CN 2-12 grossly intact. Sensation intact, DTR normal. Strength 5/5 in all 4.  Psychiatric: Decreased recent memory.  Alert and oriented x 3. Normal mood.     Labs on Admission: I have personally reviewed following labs and imaging studies  CBC: Recent Labs  Lab 01/07/21 2158  WBC 13.7*  NEUTROABS 11.0*  HGB 15.1  HCT 45.1  MCV 92.2  PLT 846   Basic Metabolic Panel: Recent Labs  Lab 01/07/21 2158  NA 136  K 3.9  CL 99  CO2 25  GLUCOSE 132*  BUN 22  CREATININE 1.06  CALCIUM 10.2   GFR: Estimated Creatinine Clearance: 61.2 mL/min (by C-G formula based on SCr of 1.06 mg/dL). Liver Function Tests: Recent Labs  Lab 01/07/21 2158  AST 37  ALT 57*  ALKPHOS 64  BILITOT 0.9  PROT 7.7  ALBUMIN 4.2   No results for input(s): LIPASE, AMYLASE in the last 168 hours. No results for input(s): AMMONIA in the last 168 hours. Coagulation Profile: Recent Labs  Lab 01/08/21 0152  INR 1.1   Cardiac Enzymes: No results for input(s): CKTOTAL, CKMB, CKMBINDEX, TROPONINI in the last 168 hours. BNP (last 3 results) Recent Labs    04/21/20 1510  PROBNP 100   HbA1C: No results for input(s): HGBA1C in the last 72 hours. CBG: No results for input(s): GLUCAP in the last 168 hours. Lipid Profile: No results for input(s): CHOL, HDL, LDLCALC, TRIG, CHOLHDL, LDLDIRECT in the last 72 hours. Thyroid Function Tests: No results for input(s): TSH, T4TOTAL, FREET4, T3FREE, THYROIDAB in the last 72 hours. Anemia Panel: No results for input(s): VITAMINB12, FOLATE,  FERRITIN, TIBC, IRON, RETICCTPCT in the last 72 hours. Urine analysis:    Component Value Date/Time   COLORURINE YELLOW 01/08/2021 0151   APPEARANCEUR CLEAR 01/08/2021 0151   LABSPEC 1.018 01/08/2021 0151   PHURINE 6.5 01/08/2021 0151   GLUCOSEU NEGATIVE  01/08/2021 0151   HGBUR NEGATIVE 01/08/2021 0151   BILIRUBINUR NEGATIVE 01/08/2021 0151   KETONESUR NEGATIVE 01/08/2021 0151   PROTEINUR NEGATIVE 01/08/2021 0151   NITRITE NEGATIVE 01/08/2021 0151   LEUKOCYTESUR NEGATIVE 01/08/2021 0151   Sepsis Labs: Recent Results (from the past 240 hour(s))  Resp Panel by RT-PCR (Flu A&B, Covid) Nasopharyngeal Swab     Status: None   Collection Time: 12/29/20  8:11 PM   Specimen: Nasopharyngeal Swab; Nasopharyngeal(NP) swabs in vial transport medium  Result Value Ref Range Status   SARS Coronavirus 2 by RT PCR NEGATIVE NEGATIVE Final    Comment: (NOTE) SARS-CoV-2 target nucleic acids are NOT DETECTED.  The SARS-CoV-2 RNA is generally detectable in upper respiratory specimens during the acute phase of infection. The lowest concentration of SARS-CoV-2 viral copies this assay can detect is 138 copies/mL. A negative result does not preclude SARS-Cov-2 infection and should not be used as the sole basis for treatment or other patient management decisions. A negative result may occur with  improper specimen collection/handling, submission of specimen other than nasopharyngeal swab, presence of viral mutation(s) within the areas targeted by this assay, and inadequate number of viral copies(<138 copies/mL). A negative result must be combined with clinical observations, patient history, and epidemiological information. The expected result is Negative.  Fact Sheet for Patients:  EntrepreneurPulse.com.au  Fact Sheet for Healthcare Providers:  IncredibleEmployment.be  This test is no t yet approved or cleared by the [place] FDA and  has been authorized for  detection and/or diagnosis of SARS-CoV-2 by FDA under an Emergency Use Authorization (EUA). This EUA will remain  in effect (meaning this test can be used) for the duration of the COVID-19 declaration under Section 564(b)(1) of the Act, 21 U.S.C.section 360bbb-3(b)(1), unless the authorization is terminated  or revoked sooner.       Influenza A by PCR NEGATIVE NEGATIVE Final   Influenza B by PCR NEGATIVE NEGATIVE Final    Comment: (NOTE) The Xpert Xpress SARS-CoV-2/FLU/RSV plus assay is intended as an aid in the diagnosis of influenza from Nasopharyngeal swab specimens and should not be used as a sole basis for treatment. Nasal washings and aspirates are unacceptable for Xpert Xpress SARS-CoV-2/FLU/RSV testing.  Fact Sheet for Patients: EntrepreneurPulse.com.au  Fact Sheet for Healthcare Providers: IncredibleEmployment.be  This test is not yet approved or cleared by the [place] FDA and has been authorized for detection and/or diagnosis of SARS-CoV-2 by FDA under an Emergency Use Authorization (EUA). This EUA will remain in effect (meaning this test can be used) for the duration of the COVID-19 declaration under Section 564(b)(1) of the Act, 21 U.S.C. section 360bbb-3(b)(1), unless the authorization is terminated or revoked.  Performed at [facility], [address] [place], [place] [address]   Resp Panel by RT-PCR (Flu A&B, Covid) Nasopharyngeal Swab     Status: None   Collection Time: 01/07/21  9:58 PM   Specimen: Nasopharyngeal Swab; Nasopharyngeal(NP) swabs in vial transport medium  Result Value Ref Range Status   SARS Coronavirus 2 by RT PCR NEGATIVE NEGATIVE Final    Comment: (NOTE) SARS-CoV-2 target nucleic acids are NOT DETECTED.  The SARS-CoV-2 RNA is generally detectable in upper respiratory specimens during the acute phase of infection. The lowest concentration of SARS-CoV-2 viral copies this assay  can detect is 138 copies/mL. A negative result does not preclude SARS-Cov-2 infection and should not be used as the sole basis for treatment or other patient management decisions. A negative result may occur with  improper specimen collection/handling, submission of specimen other than nasopharyngeal swab, presence of viral mutation(s) within the areas targeted by this assay, and inadequate number of viral copies(<138 copies/mL). A negative result must be combined with clinical observations, patient history, and epidemiological information. The expected result is Negative.  Fact Sheet for Patients:  EntrepreneurPulse.com.au  Fact Sheet for Healthcare Providers:  IncredibleEmployment.be  This test is no t yet approved or cleared by the [place] FDA and  has been authorized for detection and/or diagnosis of SARS-CoV-2 by FDA under an Emergency Use Authorization (EUA). This EUA will remain  in effect (meaning this test can be used) for the duration of the COVID-19 declaration under Section 564(b)(1) of the Act, 21 U.S.C.section 360bbb-3(b)(1), unless the authorization is terminated  or revoked sooner.       Influenza A by PCR NEGATIVE NEGATIVE Final   Influenza B by PCR NEGATIVE NEGATIVE Final    Comment: (NOTE) The Xpert Xpress SARS-CoV-2/FLU/RSV plus assay is intended as an aid in the diagnosis of influenza from Nasopharyngeal swab specimens and should not be used as a sole basis for treatment. Nasal washings and aspirates are unacceptable for Xpert Xpress SARS-CoV-2/FLU/RSV testing.  Fact Sheet for Patients: EntrepreneurPulse.com.au  Fact Sheet for Healthcare Providers: IncredibleEmployment.be  This test is not yet approved or cleared by the [place] FDA and has been authorized for detection and/or diagnosis of SARS-CoV-2 by FDA under an Emergency Use Authorization (EUA). This EUA will  remain in effect (meaning this test can be used) for the duration of the COVID-19 declaration under Section 564(b)(1) of the Act, 21 U.S.C. section 360bbb-3(b)(1), unless the authorization is terminated or revoked.  Performed at [facility], [address], [place], [place] [address]      Radiological Exams on Admission: DG Chest 2 View  Result Date: 01/07/2021 CLINICAL DATA:  Fever and pneumonia. EXAM: CHEST - 2 VIEW COMPARISON:  Chest radiograph dated 12/29/2020. FINDINGS: Mild cardiomegaly and mild central vascular congestion. Minimal left lung base atelectasis. No pleural effusion pneumothorax. No acute osseous pathology. Degenerative changes of the spine. IMPRESSION: Mild cardiomegaly and mild central vascular congestion. Electronically Signed   By: Anner Crete M.D.   On: 01/07/2021 22:44    EKG: Independently reviewed.  Sinus rhythm at 78 bpm with left bundle branch block similar to previous studies  Assessment/Plan Sepsis 2/2 neck infection: Acute patient presented and was found to be febrile up to 102 F with white blood cell count 13.7 and initial lactic acid 2.4.  Blood and urine cultures have been obtained.  Chest x-ray noted mild cardiomegaly with mild central vascular congestion with left basilar atelectasis.  Urinalysis did not note any significant concern for infection.  He was noted to have left jaw swelling for which parotid gland was thought to be cause.  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 Wilburn Cornelia of ENT in the outpatient setting.  He had not been initially given a fluid bolus, but was started on antibiotics of vancomycin and Zosyn with repeat lactic acid within normal limits.   -Admit to a MedSurg bed -Follow-up blood cultures -Check CT scan of the neck with contrast -Continue empiric antibiotics of vancomycin and [facility] consulted, will  follow-up for any further recommendations. -Case discussed with Ebbie Latus, DO of ENT after CT scan of the neck with contrast noted a deep infection of the neck with no  clear signs of abscess.  Recommended that started the patient on Decadron 8 mg IV every 8 hours x3 doses, give patient salivary agents such as lemon slices with meals, and continue with empiric antibiotics.  Patient's dentition was initially thought to be poor due to discoloration noted, and possibly thought to be a possible source of infection.  Daughter made note that he just seen the dentist a few weeks ago and had x-rays done which nothing have been recommended.  HFpEF: On physical exam patient without significant crackles appreciated and was only noted to have trace lower extremity edema.  Overall patient does not appear to be fluid overloaded..  Chest x-ray noted mild cardiomegaly with central vascular congestion.  Last EF 60-65% with grade 1 diastolic dysfunction. -Strict intake and out -Daily -Check BNP -Continue current dose of p.o. furosemide  Acute metabolic encephalopathy: Patient was noted to be altered and agitated last night, but appears to be back to his baseline.  Suspect secondary to acute infection. -Delirium precautions  Essential hypertension: Blood pressures initially elevated up to 176/83, but currently stable at 129/61. -Continue home blood pressure regimen  [place] dementia: Patient does have poor recent memory. -Continue donepezil  Transaminitis: Acute.  Since admission labs significant for AST 37 -> 55 and ALT 57-> 77. Suspect likely reactive in nature. -Recheck levels tomorrow morning  Hyperlipidemia -Continue Crestor  OSA -RT for CPAP at night  DVT prophylaxis: Lovenox Code Status: Full Family Communication: Daughter updated over the phone Disposition: Home once medically stable Consults called: ENT Admission status: Inpatient, require more than 2 midnight stay  Norval Morton  MD [facility]   If 7PM-7AM, please contact night-coverage   01/08/2021, 7:10 AM

## 2021-01-08 NOTE — Progress Notes (Signed)
New patient admitted into 6N13. On arrival, AOx4. MAE. Assessment WNL. Cardiac monitor placed and patient mae comfortable in bed. Oriented to the room and unit. Call bell placed within reach and bed alarm activated. Awaiting on MD's orders.

## 2021-01-08 NOTE — Progress Notes (Signed)
Pharmacy Antibiotic Note  [name] [name] is a 80 y.o. male admitted on 01/07/2021 with  parotitis .  Pharmacy has been consulted for Vancomycin dosing.  Plan: Vancomycin 1000 IV every 12 hours. (Goal AUC 400-550, eAUC 486, Scr 1.06) Zosyn 3.375g IV q8h (4 hour infusion) - MD dosing  Height: 5\' 10"  (177.8 cm) Weight: 91.7 kg (202 lb 2.6 oz) IBW/kg (Calculated) : 73  Temp (24hrs), Avg:99.3 F (37.4 C), Min:97.9 F (36.6 C), Max:102 F (38.9 C)  Recent Labs  Lab 01/07/21 2158 01/08/21 0022  WBC 13.7*  --   CREATININE 1.06  --   LATICACIDVEN 2.4* 1.4    Estimated Creatinine Clearance: 61.2 mL/min (by C-G formula based on SCr of 1.06 mg/dL).    Allergies  Allergen Reactions   Darifenacin Other (See Comments)    PT STATES IT MAKES HIM "BONKERS" AND VERY TIRED   Enablex [Darifenacin Hydrobromide Er]     PT STATES IT MAKES HIM "BONKERS" AND VERY TIRED   Ceftriaxone Other (See Comments)    Reaction??   Atorvastatin Other (See Comments)    Muscle aches/weakness   Ezetimibe Other (See Comments)    Muscle aches/weakness   Levaquin [Levofloxacin] Diarrhea   Oxybutynin Other (See Comments)    Memory impairment, temperament changes   Sulfamethoxazole Other (See Comments)    Childhood allergy   Sulfasalazine Other (See Comments)    Childhood allergy   Sulfonamide Derivatives Other (See Comments)    Childhood allergy    Antimicrobials this admission: Vancomycin 11/11 >>  Zosyn 11/11 >>    Microbiology results: 11/11 BCx: pending 11/11 UCx: : pending 11/11 MRSA PCR: pending   Thank you for allowing pharmacy to be a part of this patient's care.  Ardyth Harps, PharmD Clinical Pharmacist

## 2021-01-08 NOTE — Progress Notes (Signed)
Mobility Specialist Progress Note:   01/08/21 1520  Mobility  Activity Ambulated in hall  Level of Assistance Contact guard assist, steadying assist  Assistive Device Front wheel walker  Distance Ambulated (ft) 420 ft  Mobility Ambulated with assistance in hallway  Mobility Response Tolerated well  Mobility performed by Mobility specialist  $Mobility charge 1 Mobility   Pt asx during ambulation. Displayed generalized weakness, requiring contact G. Pt walks with SPC at baseline.   Nelta Numbers Mobility Specialist  Phone [phone]

## 2021-01-08 NOTE — ED Provider Notes (Signed)
[facility] EMERGENCY DEPT Provider Note   CSN: [id number] Arrival date & time: 01/07/21  2107     History Chief Complaint  Patient presents with   Fever    [name] [name] is a 80 y.o. male.  The history is provided by the spouse. The history is limited by the condition of the patient (level 5 caveat, dementia).  Fever Temp source:  Oral Severity:  Moderate Onset quality:  Gradual Duration:  1 day Timing:  Constant Progression:  Unchanged Chronicity:  New Relieved by:  Nothing Worsened by:  Nothing Ineffective treatments:  None tried Associated symptoms: confusion   Associated symptoms: no vomiting       Past Medical History:  Diagnosis Date   Alzheimer disease ([place])    Chronic kidney disease    mild insuffiency   Coronary artery disease    LHC 4/10: Mid LAD 40-50%, then 70%, OM1 20-30%, EF 65%. Mid LAD FFR 0.89 (not hemodynamically significant). Medical therapy was continued.   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   Venous stasis dermatitis of left lower extremity 04/07/2016   Bilateral impacted cerumen 01/18/2016   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   Unsteadiness 01/18/2016   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   Cellulitis of left lower leg    Left leg swelling    Cellulitis 08/23/2014   Chronic diastolic CHF (congestive heart failure) ([place]) 04/29/2014   DOE (dyspnea on exertion) 11/12/2011   Stage 3a chronic kidney disease (HCC)     Thrombocytopenia, unspecified ([place]) 07/29/2010   Vitamin D deficiency 05/19/2010   Depression, major, recurrent, 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   Urinary urgency 05/19/2010   Vertigo, 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   Benign essential hypertension 12/23/2009   Actinic keratosis 12/23/2009   Seborrheic keratosis 03/18/2009   Benign neoplasm 03/18/2009   Dyslipidemia 01/18/2008   Obstructive sleep apnea 01/18/2008   Coronary atherosclerosis 01/18/2008   Atherosclerotic heart disease of native coronary artery without angina pectoris 01/18/2008   Cataract in degenerative disorder 11/21/2006   Unspecified protein-calorie malnutrition ([place]) 09/27/2006   Gastro-esophageal reflux disease with esophagitis 09/15/2006   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 therapy   right shoulder surgery     TOTAL HIP ARTHROPLASTY Left 09/14/2018   Procedure: LEFT TOTAL HIP ARTHROPLASTY ANTERIOR APPROACH;  Surgeon: Mcarthur Rossetti, MD;  Location: [facility];  Service: Orthopedics;  Laterality: Left;       Family History  Problem Relation Age of Onset   Cancer Mother    Hypertension Mother    Other Father        old age   Hypertension Father     Social History   Tobacco Use   Smoking status: Former  Types: Pipe    Quit date: 02/28/1990    Years since quitting: 30.8   Smokeless tobacco: Never   Tobacco comments:    smoked pipe only   Vaping Use   Vaping Use: Never used  Substance Use Topics   Alcohol use: No    Alcohol/week: 0.0 standard drinks    Comment: rarely wine   Drug use: No    Home Medications Prior to Admission medications   Medication Sig Start Date End Date Taking? Authorizing  Provider  amoxicillin-clavulanate (AUGMENTIN) 500-125 MG tablet Take 1 tablet (500 mg total) by mouth every 8 (eight) hours. 12/29/20   Veryl Speak, MD  aspirin 81 MG chewable tablet Chew 1 tablet (81 mg total) by mouth daily. 09/07/20   Sande Rives E, PA-C  calcium-vitamin D (OSCAL WITH D) 500-200 MG-UNIT per tablet Take 1 tablet by mouth 2 (two) times daily.    [provider]  docusate sodium (COLACE) 100 MG capsule Take 200 mg by mouth 2 (two) times daily.    [provider]  donepezil (ARICEPT) 10 MG tablet Take 10 mg by mouth at bedtime. 12/01/16   [provider]  fesoterodine (TOVIAZ) 8 MG TB24 tablet Take 8 mg by mouth daily. 12/13/18   [provider]  fluticasone (FLONASE) 50 MCG/ACT nasal spray Place 2 sprays into both nostrils daily as needed for allergies. 05/15/17   [provider]  furosemide (LASIX) 20 MG tablet Take 1 tablet (20 mg total) by mouth daily. 09/07/20   Sande Rives E, PA-C  lisinopril (ZESTRIL) 20 MG tablet Take 20 mg by mouth daily. 08/23/18   [provider]  mirabegron ER (MYRBETRIQ) 25 MG TB24 tablet Take 25 mg by mouth daily.  08/29/18   [provider]  Misc Natural Products (OSTEO BI-FLEX/5-LOXIN ADVANCED) TABS Take 1 tablet by mouth 2 (two) times daily.    [provider]  Multiple Vitamin (MULTIVITAMIN) tablet Take 1 tablet by mouth daily.    [provider]  Omega-3 Fatty Acids (FISH OIL) 1000 MG CAPS Take 1 capsule (1,000 mg total) by mouth 2 (two) times daily. 07/31/20   Little Ishikawa, MD  Omega-3 Fatty Acids (FISH OIL) 1200 MG CAPS Take 1,200 mg by mouth daily.    [provider]  predniSONE (DELTASONE) 10 MG tablet Take 2 tablets (20 mg total) by mouth 2 (two) times daily with a meal. 12/29/20   [place], Nathaneil Canary, MD  Psyllium (METAMUCIL PO) Take 3.4 g by mouth See admin instructions. Mix 3.4 grams of powder into 8 ounces of water or juice and drink once a day     [provider]  rosuvastatin (CRESTOR) 10 MG tablet TAKE 1/2 TABLET BY MOUTH EVERY OTHER DAY 06/22/20   [place], Peter M, MD    Allergies    Darifenacin, Enablex [darifenacin hydrobromide er], Ceftriaxone, Atorvastatin, Ezetimibe, Levaquin [levofloxacin], Oxybutynin, Sulfamethoxazole, Sulfasalazine, and Sulfonamide derivatives  Review of Systems   Review of Systems  Unable to perform ROS: Dementia  Constitutional:  Positive for fever.  HENT:  Positive for facial swelling.   Respiratory:  Negative for wheezing.   Cardiovascular:  Negative for leg swelling.  Gastrointestinal:  Negative for vomiting.  Skin:  Negative for wound.  Psychiatric/Behavioral:  Positive for confusion.    Physical Exam Updated Vital Signs BP (!) 165/79 (BP Location: Right Arm)   Pulse 81   Temp 98.3 F (36.8 C) (Oral)   Resp 18   SpO2 99%   Physical Exam  Vitals and nursing note reviewed.  Constitutional:      Appearance: Normal appearance. He is not diaphoretic.  HENT:     Head: Normocephalic and atraumatic.     Comments: Swelling of the left parotid gland     Nose: Nose normal.  Cardiovascular:     Rate and Rhythm: Normal rate and regular rhythm.     Pulses: Normal pulses.     Heart sounds: Normal heart sounds.  Pulmonary:     Breath sounds: Decreased air movement present. No stridor.  Abdominal:     General: Bowel sounds are normal.     Palpations: Abdomen is soft.     Tenderness: There is no abdominal tenderness. There is no guarding or rebound.  Musculoskeletal:        General: Normal range of motion.     Cervical back: Normal range of motion and neck supple.  Skin:    General: Skin is warm and dry.  Neurological:     Mental Status: He is alert.     Deep Tendon Reflexes: Reflexes normal.    ED Results / Procedures / Treatments   Labs (all labs ordered are listed, but only abnormal results are displayed) Results for orders placed or performed during the hospital encounter of  01/07/21  Resp Panel by RT-PCR (Flu A&B, Covid) Nasopharyngeal Swab   Specimen: Nasopharyngeal Swab; Nasopharyngeal(NP) swabs in vial transport medium  Result Value Ref Range   SARS Coronavirus 2 by RT PCR NEGATIVE NEGATIVE   Influenza A by PCR NEGATIVE NEGATIVE   Influenza B by PCR NEGATIVE NEGATIVE  Lactic acid, plasma  Result Value Ref Range   Lactic Acid, Venous 2.4 (HH) 0.5 - 1.9 mmol/L  Lactic acid, plasma  Result Value Ref Range   Lactic Acid, Venous 1.4 0.5 - 1.9 mmol/L  Comprehensive metabolic panel  Result Value Ref Range   Sodium 136 135 - 145 mmol/L   Potassium 3.9 3.5 - 5.1 mmol/L   Chloride 99 98 - 111 mmol/L   CO2 25 22 - 32 mmol/L   Glucose, Bld 132 (H) 70 - 99 mg/dL   BUN 22 8 - 23 mg/dL   Creatinine, Ser 1.06 0.61 - 1.24 mg/dL   Calcium 10.2 8.9 - 10.3 mg/dL   Total Protein 7.7 6.5 - 8.1 g/dL   Albumin 4.2 3.5 - 5.0 g/dL   AST 37 15 - 41 U/L   ALT 57 (H) 0 - 44 U/L   Alkaline Phosphatase 64 38 - 126 U/L   Total Bilirubin 0.9 0.3 - 1.2 mg/dL   GFR, Estimated >60 >60 mL/min   Anion gap 12 5 - 15  CBC with Differential  Result Value Ref Range   WBC 13.7 (H) 4.0 - 10.5 K/uL   RBC 4.89 4.22 - 5.81 MIL/uL   Hemoglobin 15.1 13.0 - 17.0 g/dL   HCT 45.1 39.0 - 52.0 %   MCV 92.2 80.0 - 100.0 fL   MCH 30.9 26.0 - 34.0 pg   MCHC 33.5 30.0 - 36.0 g/dL   RDW 13.1 11.5 - 15.5 %   Platelets 246 150 - 400 K/uL   nRBC 0.0 0.0 - 0.2 %   Neutrophils Relative % 79 %   Neutro Abs 11.0 (H) 1.7 - 7.7 K/uL   Lymphocytes Relative 9 %   Lymphs Abs 1.2 0.7 - 4.0 K/uL   Monocytes Relative 10 %   Monocytes Absolute 1.3 (H) 0.1 - 1.0 K/uL   Eosinophils Relative 1 %  Eosinophils Absolute 0.1 0.0 - 0.5 K/uL   Basophils Relative 0 %   Basophils Absolute 0.0 0.0 - 0.1 K/uL   Immature Granulocytes 1 %   Abs Immature Granulocytes 0.09 (H) 0.00 - 0.07 K/uL  Urinalysis, Routine w reflex microscopic Urine, In & Out Cath  Result Value Ref Range   Color, Urine YELLOW YELLOW    APPearance CLEAR CLEAR   Specific Gravity, Urine 1.018 1.005 - 1.030   pH 6.5 5.0 - 8.0   Glucose, UA NEGATIVE NEGATIVE mg/dL   Hgb urine dipstick NEGATIVE NEGATIVE   Bilirubin Urine NEGATIVE NEGATIVE   Ketones, ur NEGATIVE NEGATIVE mg/dL   Protein, ur NEGATIVE NEGATIVE mg/dL   Nitrite NEGATIVE NEGATIVE   Leukocytes,Ua NEGATIVE NEGATIVE   RBC / HPF 0-5 0 - 5 RBC/hpf   WBC, UA 0-5 0 - 5 WBC/hpf   Squamous Epithelial / LPF 0-5 0 - 5  Protime-INR  Result Value Ref Range   Prothrombin Time 14.1 11.4 - 15.2 seconds   INR 1.1 0.8 - 1.2  APTT  Result Value Ref Range   aPTT 28 24 - 36 seconds   DG Chest 2 View  Result Date: 01/07/2021 CLINICAL DATA:  Fever and pneumonia. EXAM: CHEST - 2 VIEW COMPARISON:  Chest radiograph dated 12/29/2020. FINDINGS: Mild cardiomegaly and mild central vascular congestion. Minimal left lung base atelectasis. No pleural effusion pneumothorax. No acute osseous pathology. Degenerative changes of the spine. IMPRESSION: Mild cardiomegaly and mild central vascular congestion. Electronically Signed   By: Anner Crete M.D.   On: 01/07/2021 22:44   DG Chest 2 View  Result Date: 12/29/2020 CLINICAL DATA:  Cough with congestion. EXAM: CHEST - 2 VIEW COMPARISON:  Chest x-ray 09/11/2020. FINDINGS: Heart is enlarged. There is linear atelectasis in the left lung base. There is no pleural effusion or pneumothorax. Degenerative changes affect the spine and shoulders. IMPRESSION: 1. Left basilar atelectasis. 2. Stable cardiomegaly. Electronically Signed   By: Ronney Asters M.D.   On: 12/29/2020 20:34     Radiology DG Chest 2 View  Result Date: 01/07/2021 CLINICAL DATA:  Fever and pneumonia. EXAM: CHEST - 2 VIEW COMPARISON:  Chest radiograph dated 12/29/2020. FINDINGS: Mild cardiomegaly and mild central vascular congestion. Minimal left lung base atelectasis. No pleural effusion pneumothorax. No acute osseous pathology. Degenerative changes of the spine. IMPRESSION: Mild  cardiomegaly and mild central vascular congestion. Electronically Signed   By: Anner Crete M.D.   On: 01/07/2021 22:44    Procedures Procedures   Medications Ordered in ED Medications  piperacillin-tazobactam (ZOSYN) IVPB 3.375 g (has no administration in time range)  acetaminophen (TYLENOL) tablet 1,000 mg (has no administration in time range)  vancomycin (VANCOCIN) IVPB 1000 mg/200 mL premix (1,000 mg Intravenous New Bag/Given 01/08/21 0213)    ED Course  I have reviewed the triage vital signs and the nursing notes.  Pertinent labs & imaging results that were available during my care of the patient were reviewed by me and considered in my medical decision making (see chart for details).  Given fluid overload on Xray will not give IVF.  Discussed antibiotics with pharmacy.    MDM Reviewed: previous chart, nursing note and vitals Reviewed previous: labs and x-ray Interpretation: labs, ECG and x-ray Total time providing critical care: 30-74 minutes (multiple antibiotics). This excludes time spent performing separately reportable procedures and services. Consults: admitting MD CRITICAL CARE Performed by: Hamna Asa K Malik Ruffino-Rasch Total critical care time: 30 minutes Critical care time was exclusive of separately billable  procedures and treating other patients. Critical care was necessary to treat or prevent imminent or life-threatening deterioration. Critical care was time spent personally by me on the following activities: development of treatment plan with patient and/or surrogate as well as nursing, discussions with consultants, evaluation of patient's response to treatment, examination of patient, obtaining history from patient or surrogate, ordering and performing treatments and interventions, ordering and review of laboratory studies, ordering and review of radiographic studies, pulse oximetry and re-evaluation of patient's condition.   Final Clinical Impression(s) / ED  Diagnoses Final diagnoses:  Sepsis, due to unspecified organism, unspecified whether acute organ dysfunction present [facility])  Altered mental status, unspecified altered mental status type    Rx / DC Orders ED Discharge Orders     None        Jermar Colter, MD 01/08/21 [phone]

## 2021-01-08 NOTE — ED Notes (Signed)
Called [facility] to transport patient to [facility] 6N room 13

## 2021-01-08 NOTE — Progress Notes (Signed)
[facility] will assume care on arrival to accepting facility. Until arrival, care as per EDP. However, [facility] available 24/7 for questions and assistance.   Nursing staff please page [facility] Admits and Consults ([phone]) as soon as the patient arrives to the hospital.  Jhordan Mckibben, DO  

## 2021-01-09 DIAGNOSIS — A419 Sepsis, unspecified organism: Secondary | ICD-10-CM | POA: Diagnosis not present

## 2021-01-09 DIAGNOSIS — L089 Local infection of the skin and subcutaneous tissue, unspecified: Secondary | ICD-10-CM | POA: Diagnosis not present

## 2021-01-09 DIAGNOSIS — R748 Abnormal levels of other serum enzymes: Secondary | ICD-10-CM

## 2021-01-09 DIAGNOSIS — R6520 Severe sepsis without septic shock: Secondary | ICD-10-CM

## 2021-01-09 DIAGNOSIS — G9341 Metabolic encephalopathy: Secondary | ICD-10-CM | POA: Diagnosis not present

## 2021-01-09 DIAGNOSIS — N179 Acute kidney failure, unspecified: Secondary | ICD-10-CM

## 2021-01-09 DIAGNOSIS — E871 Hypo-osmolality and hyponatremia: Secondary | ICD-10-CM

## 2021-01-09 DIAGNOSIS — G308 Other Alzheimer's disease: Secondary | ICD-10-CM | POA: Diagnosis not present

## 2021-01-09 LAB — CBC
HCT: 38.7 % — ABNORMAL LOW (ref 39.0–52.0)
Hemoglobin: 13.2 g/dL (ref 13.0–17.0)
MCH: 31.4 pg (ref 26.0–34.0)
MCHC: 34.1 g/dL (ref 30.0–36.0)
MCV: 91.9 fL (ref 80.0–100.0)
Platelets: 227 10*3/uL (ref 150–400)
RBC: 4.21 MIL/uL — ABNORMAL LOW (ref 4.22–5.81)
RDW: 12.7 % (ref 11.5–15.5)
WBC: 7.9 10*3/uL (ref 4.0–10.5)
nRBC: 0 % (ref 0.0–0.2)

## 2021-01-09 LAB — BASIC METABOLIC PANEL
Anion gap: 7 (ref 5–15)
BUN: 25 mg/dL — ABNORMAL HIGH (ref 8–23)
CO2: 23 mmol/L (ref 22–32)
Calcium: 8.9 mg/dL (ref 8.9–10.3)
Chloride: 102 mmol/L (ref 98–111)
Creatinine, Ser: 1.41 mg/dL — ABNORMAL HIGH (ref 0.61–1.24)
GFR, Estimated: 50 mL/min — ABNORMAL LOW (ref >60)
Glucose, Bld: 239 mg/dL — ABNORMAL HIGH (ref 70–99)
Potassium: 4.1 mmol/L (ref 3.5–5.1)
Sodium: 132 mmol/L — ABNORMAL LOW (ref 135–145)

## 2021-01-09 LAB — URINE CULTURE: Culture: NO GROWTH

## 2021-01-09 MED ORDER — HYDRALAZINE HCL 25 MG PO TABS: 25.0000 mg | ORAL_TABLET | Freq: Four times a day (QID) | ORAL | Status: DC | PRN

## 2021-01-09 MED ORDER — GUAIFENESIN-DM 100-10 MG/5ML PO SYRP
5.0000 mL | ORAL_SOLUTION | ORAL | Status: DC | PRN
  Administered 2021-01-09: 5 mL via ORAL
  Filled 2021-01-09: qty 5

## 2021-01-09 NOTE — Progress Notes (Signed)
PROGRESS NOTE  [name] [name] QTM:[id number] DOB: 1940-11-27   PCP: Chesley Noon, MD  Patient is from: Home.  DOA: 01/07/2021 LOS: 1  Chief complaints:  Chief Complaint  Patient presents with   Fever     Brief Narrative / Interim history: 80 year old M with PMH of Alzheimer's dementia, diastolic CHF, CAD, OSA on CPAP, HTN, HLD, parotiditis and recent "acute bronchitis) for which she was treated with Augmentin and prednisone taper course presenting with agitation, sore throat, productive cough, swelling about left jaw, and admitted with sepsis due to deep neck infection.  He was febrile to 102.  Leukocytosis 13.7.  Lactic acid 2.4 but improved to 1.4.  CT neck concerning for deep neck infection but no fluid collection or abscess.  Case discussed with ENT, Dr. Ebbie Latus who who recommended Decadron 8 mg IV every 8 hours for 3 doses, salivary agents and empiric antibiotics.  Cultures obtained.  Patient was started on IV vancomycin and Zosyn.   Subjective: Seen and examined earlier this morning.  No major events overnight of this morning.  No complaints but tired of lying in bed.  He is asking if he is allowed to get out of the bed and move around.  He denies neck pain, sore throat, shortness of breath, difficulty swallowing, chest pain, GI or UTI symptoms.  He is fairly oriented and functional.  Objective: Vitals:   01/08/21 1953 01/09/21 0249 01/09/21 0508 01/09/21 0825  BP: 126/74  129/68 133/75  Pulse: 84  (!) 57 70  Resp: 17  20 19   Temp: 98.6 F (37 C)  (!) 97.4 F (36.3 C) 97.7 F (36.5 C)  TempSrc: Oral  Oral Oral  SpO2: 95%  97% 97%  Weight:  89.3 kg    Height:        Intake/Output Summary (Last 24 hours) at 01/09/2021 1116 Last data filed at 01/09/2021 0546 Gross per 24 hour  Intake 640 ml  Output 1125 ml  Net -485 ml   Filed Weights   01/08/21 0525 01/09/21 0249  Weight: 91.7 kg 89.3 kg    Examination:  GENERAL: No apparent distress.   Nontoxic. HEENT: MMM.  Some swelling about the left parathyroid gland.  No erythema or tenderness.  No trismus. NECK: Supple.  Full range of motion in neck. RESP: 97% on RA.  No IWOB.  Fair aeration bilaterally. CVS:  RRR. Heart sounds normal.  ABD/GI/GU: BS+. Abd soft, NTND.  MSK/EXT:  Moves extremities. No apparent deformity. No edema.  SKIN: no apparent skin lesion or wound NEURO: Awake, alert and oriented appropriately.  No apparent focal neuro deficit.  Able to walk from bed to bedside chair. PSYCH: Calm. Normal affect.   Procedures:  None  Microbiology summarized: [id number] and influenza PCR nonreactive. MRSA PCR screen negative. Urine culture and blood culture NGTD.  Assessment & Plan: Severe sepsis due to neck infection: POA.  Had fever, leukocytosis, lactic acidosis and AMS on presentation.  CXR raise concern for cardiomegaly and mild central vascular congestion.  CT neck with contrast concerning for deep infection of the neck with no signs of clear abscess.  Cultures NGTD.  MRSA PCR negative. -ENT, Dr.Skotnicki recommended Decadron 8 mg q8h x3 doses, salivary agents and empiric antibiotics -Patient is normally followed by Dr. Wilburn Cornelia outpatient. -Patient without trismus, dysphagia or respiratory distress.  FROM in his neck. -Discontinue IV vancomycin.  MRSA PCR negative.  He is also having AKI. -Continue IV Zosyn for now.  May de-escalate to IV  Unasyn in the morning.  Chronic diastolic CHF: Last TTE with LVEF of 60 to 65% and G1-DD.  Appears euvolemic on exam despite CXR finding of cardiomegaly and mild vascular congestion.  BNP within normal.  No respiratory distress. -Discontinue Lasix given AKI. -Closely monitor fluid status, renal functions and electrolytes. -Will not fluid restrict at this time  Acute metabolic encephalopathy/agitation: Likely due to #1.  Could be from recent steroid as well.   History of Alzheimer's dementia -He is fairly oriented and functional.   Seems to be back to his baseline. -Avoid sedating medications. -Continue home Aricept. -Delirium and fall precautions  AKI/azotemia: Likely from Vanco, Zosyn, lisinopril and Lasix Recent Labs    07/28/20 0119 07/29/20 0157 07/30/20 0204 07/31/20 0247 09/11/20 0817 09/12/20 0235 09/13/20 0418 01/07/21 2158 01/08/21 0838 01/09/21 0136  BUN 19 19 20 17 21 22 14 22 18  25*  CREATININE 1.30* 1.24 1.04 0.98 1.34* 1.28* 1.19 1.06 1.14 1.41*  -Discontinued Vanco, lisinopril and Lasix. -Recheck in the morning -Won't give IV fluid given CHF and CXR finding of cardiomegaly and vascular congestion.  Hyponatremia: Likely from AKI on lisinopril. -Recheck in the morning   Uncontrolled hypertension: Improved and normotensive. -Holding lisinopril and Lasix due to AKI -Hydralazine as needed  Transaminitis/hyperbilirubinemia: Likely from sepsis.  No GI symptoms. -Recheck -Consider holding Crestor further work-up if worse  Hyperlipidemia -Continue Crestor but will stop if LFT worse   OSA -CPAP at night  Body mass index is 28.25 kg/m.         DVT prophylaxis:  enoxaparin (LOVENOX) injection 40 mg Start: 01/08/21 1000  Code Status: Full code Family Communication: Updated daughter over the phone Level of care: Med-Surg Status is: Inpatient  Remains inpatient appropriate because: Need IV antibiotics for severe sepsis due to leg infection.  Also with AKI   Consultants:  ENT   Sch Meds:  Scheduled Meds:  aspirin  81 mg Oral Daily   docusate sodium  200 mg Oral BID   donepezil  10 mg Oral QHS   enoxaparin (LOVENOX) injection  40 mg Subcutaneous Q24H   fesoterodine  8 mg Oral q morning   guaiFENesin  600 mg Oral BID   mirabegron ER  50 mg Oral q morning   psyllium  1 packet Oral Daily   rosuvastatin  5 mg Oral QODAY   sodium chloride flush  3 mL Intravenous Q12H   Continuous Infusions:  piperacillin-tazobactam (ZOSYN)  IV 3.375 g (01/09/21 1022)   PRN  Meds:.acetaminophen **OR** acetaminophen, albuterol, fluticasone, ondansetron **OR** ondansetron (ZOFRAN) IV  Antimicrobials: Anti-infectives (From admission, onward)    Start     Dose/Rate Route Frequency Ordered Stop   01/09/21 0700  nitrofurantoin (macrocrystal-monohydrate) (MACROBID) capsule 100 mg  Status:  Discontinued        100 mg Oral Every morning 01/08/21 1432 01/09/21 0742   01/08/21 1400  vancomycin (VANCOREADY) IVPB 1000 mg/200 mL  Status:  Discontinued        1,000 mg 200 mL/hr over 60 Minutes Intravenous Every 12 hours 01/08/21 0846 01/09/21 0741   01/08/21 0915  piperacillin-tazobactam (ZOSYN) IVPB 3.375 g        3.375 g 12.5 mL/hr over 240 Minutes Intravenous Every 8 hours 01/08/21 0816     01/08/21 0900  piperacillin-tazobactam (ZOSYN) IVPB 3.375 g  Status:  Discontinued        3.375 g 100 mL/hr over 30 Minutes Intravenous Every 8 hours 01/08/21 0750 01/08/21 0813   01/08/21 0145  vancomycin (  VANCOCIN) IVPB 1000 mg/200 mL premix        1,000 mg 200 mL/hr over 60 Minutes Intravenous  Once 01/08/21 0134 01/08/21 0321   01/08/21 0145  piperacillin-tazobactam (ZOSYN) IVPB 3.375 g        3.375 g 100 mL/hr over 30 Minutes Intravenous  Once 01/08/21 0137 01/08/21 0415        I have personally reviewed the following labs and images: CBC: Recent Labs  Lab 01/07/21 2158 01/08/21 0838 01/09/21 0136  WBC 13.7* 11.4* 7.9  NEUTROABS 11.0* 8.8*  --   HGB 15.1 13.5 13.2  HCT 45.1 40.7 38.7*  MCV 92.2 93.1 91.9  PLT 246 246 227   BMP &GFR Recent Labs  Lab 01/07/21 2158 01/08/21 0838 01/09/21 0136  NA 136 135 132*  K 3.9 3.8 4.1  CL 99 100 102  CO2 25 27 23   GLUCOSE 132* 134* 239*  BUN 22 18 25*  CREATININE 1.06 1.14 1.41*  CALCIUM 10.2 9.5 8.9   Estimated Creatinine Clearance: 45.4 mL/min (A) (by C-G formula based on SCr of 1.41 mg/dL (H)). Liver & Pancreas: Recent Labs  Lab 01/07/21 2158 01/08/21 0838  AST 37 55*  ALT 57* 77*  ALKPHOS 64 55   BILITOT 0.9 1.5*  PROT 7.7 6.3*  ALBUMIN 4.2 2.9*   No results for input(s): LIPASE, AMYLASE in the last 168 hours. No results for input(s): AMMONIA in the last 168 hours. Diabetic: No results for input(s): HGBA1C in the last 72 hours. No results for input(s): GLUCAP in the last 168 hours. Cardiac Enzymes: No results for input(s): CKTOTAL, CKMB, CKMBINDEX, TROPONINI in the last 168 hours. Recent Labs    04/21/20 1510  PROBNP 100   Coagulation Profile: Recent Labs  Lab 01/08/21 0152  INR 1.1   Thyroid Function Tests: No results for input(s): TSH, T4TOTAL, FREET4, T3FREE, THYROIDAB in the last 72 hours. Lipid Profile: No results for input(s): CHOL, HDL, LDLCALC, TRIG, CHOLHDL, LDLDIRECT in the last 72 hours. Anemia Panel: No results for input(s): VITAMINB12, FOLATE, FERRITIN, TIBC, IRON, RETICCTPCT in the last 72 hours. Urine analysis:    Component Value Date/Time   COLORURINE YELLOW 01/08/2021 0151   APPEARANCEUR CLEAR 01/08/2021 0151   LABSPEC 1.018 01/08/2021 0151   PHURINE 6.5 01/08/2021 0151   GLUCOSEU NEGATIVE 01/08/2021 0151   HGBUR NEGATIVE 01/08/2021 0151   BILIRUBINUR NEGATIVE 01/08/2021 0151   KETONESUR NEGATIVE 01/08/2021 0151   PROTEINUR NEGATIVE 01/08/2021 0151   NITRITE NEGATIVE 01/08/2021 0151   LEUKOCYTESUR NEGATIVE 01/08/2021 0151   Sepsis Labs: Invalid input(s): PROCALCITONIN, [place]  Microbiology: Recent Results (from the past 240 hour(s))  Resp Panel by RT-PCR (Flu A&B, Covid) Nasopharyngeal Swab     Status: None   Collection Time: 01/07/21  9:58 PM   Specimen: Nasopharyngeal Swab; Nasopharyngeal(NP) swabs in vial transport medium  Result Value Ref Range Status   SARS Coronavirus 2 by RT PCR NEGATIVE NEGATIVE Final    Comment: (NOTE) SARS-CoV-2 target nucleic acids are NOT DETECTED.  The SARS-CoV-2 RNA is generally detectable in upper respiratory specimens during the acute phase of infection. The lowest concentration of SARS-CoV-2  viral copies this assay can detect is 138 copies/mL. A negative result does not preclude SARS-Cov-2 infection and should not be used as the sole basis for treatment or other patient management decisions. A negative result may occur with  improper specimen collection/handling, submission of specimen other than nasopharyngeal swab, presence of viral mutation(s) within the areas targeted by this assay, and  inadequate number of viral copies(<138 copies/mL). A negative result must be combined with clinical observations, patient history, and epidemiological information. The expected result is Negative.  Fact Sheet for Patients:  EntrepreneurPulse.com.au  Fact Sheet for Healthcare Providers:  IncredibleEmployment.be  This test is no t yet approved or cleared by the [place] FDA and  has been authorized for detection and/or diagnosis of SARS-CoV-2 by FDA under an Emergency Use Authorization (EUA). This EUA will remain  in effect (meaning this test can be used) for the duration of the COVID-19 declaration under Section 564(b)(1) of the Act, 21 U.S.C.section 360bbb-3(b)(1), unless the authorization is terminated  or revoked sooner.       Influenza A by PCR NEGATIVE NEGATIVE Final   Influenza B by PCR NEGATIVE NEGATIVE Final    Comment: (NOTE) The Xpert Xpress SARS-CoV-2/FLU/RSV plus assay is intended as an aid in the diagnosis of influenza from Nasopharyngeal swab specimens and should not be used as a sole basis for treatment. Nasal washings and aspirates are unacceptable for Xpert Xpress SARS-CoV-2/FLU/RSV testing.  Fact Sheet for Patients: EntrepreneurPulse.com.au  Fact Sheet for Healthcare Providers: IncredibleEmployment.be  This test is not yet approved or cleared by the [place] FDA and has been authorized for detection and/or diagnosis of SARS-CoV-2 by FDA under an Emergency Use Authorization  (EUA). This EUA will remain in effect (meaning this test can be used) for the duration of the COVID-19 declaration under Section 564(b)(1) of the Act, 21 U.S.C. section 360bbb-3(b)(1), unless the authorization is terminated or revoked.  Performed at [facility], [address], [place], [place] [address]   Blood Culture (routine x 2)     Status: None (Preliminary result)   Collection Time: 01/08/21 12:22 AM   Specimen: BLOOD  Result Value Ref Range Status   Specimen Description   Final    BLOOD BOTTLES DRAWN AEROBIC AND ANAEROBIC Performed at [facility], [address], [place], [place] [address]    Special Requests   Final    Blood Culture adequate volume LEFT ANTECUBITAL Performed at [facility], [address] [place], [place] [address]    Culture   Final    NO GROWTH < 24 HOURS Performed at [facility], [place] [address], [place], [place] [address]    Report Status PENDING  Incomplete  Blood Culture (routine x 2)     Status: None (Preliminary result)   Collection Time: 01/08/21  1:51 AM   Specimen: BLOOD  Result Value Ref Range Status   Specimen Description   Final    BLOOD BOTTLES DRAWN AEROBIC AND ANAEROBIC Performed at [facility], [address] [place], [place] [address]    Special Requests   Final    Blood Culture results may not be optimal due to an inadequate volume of blood received in culture bottles Performed at [facility], [address] [place], [place] [address]    Culture   Final    NO GROWTH < 24 HOURS Performed at [facility], [place] [address], [place], [place] [address]    Report Status PENDING  Incomplete  Urine Culture     Status: None   Collection Time: 01/08/21  1:52 AM   Specimen: Urine, Catheterized  Result Value Ref Range Status   Specimen Description   Final    URINE, CATHETERIZED Performed at [facility], [address] [place],  [address]    Special Requests   Final  NONE Performed at [facility], [address], [place], [place] [address]    Culture   Final    NO GROWTH Performed at [facility], [place] [address], [place], [place] [address]    Report Status 01/09/2021 FINAL  Final  MRSA Next Gen by PCR, Nasal     Status: None   Collection Time: 01/08/21  8:42 AM   Specimen: Nasal Mucosa; Nasal Swab  Result Value Ref Range Status   MRSA by PCR Next Gen NOT DETECTED NOT DETECTED Final    Comment: (NOTE) The GeneXpert MRSA Assay (FDA approved for NASAL specimens only), is one component of a comprehensive MRSA colonization surveillance program. It is not intended to diagnose MRSA infection nor to guide or monitor treatment for MRSA infections. Test performance is not FDA approved in patients less than 95 years old. Performed at [facility], [place] [address] [place], [place] [address]     Radiology Studies: No results found.    Rhett Najera T. Time  If 7PM-7AM, please contact night-coverage [email or web address] 01/09/2021, 11:16 AM

## 2021-01-10 DIAGNOSIS — R5381 Other malaise: Secondary | ICD-10-CM

## 2021-01-10 DIAGNOSIS — I480 Paroxysmal atrial fibrillation: Secondary | ICD-10-CM

## 2021-01-10 DIAGNOSIS — G308 Other Alzheimer's disease: Secondary | ICD-10-CM | POA: Diagnosis not present

## 2021-01-10 DIAGNOSIS — L089 Local infection of the skin and subcutaneous tissue, unspecified: Secondary | ICD-10-CM | POA: Diagnosis not present

## 2021-01-10 DIAGNOSIS — G4733 Obstructive sleep apnea (adult) (pediatric): Secondary | ICD-10-CM | POA: Diagnosis not present

## 2021-01-10 DIAGNOSIS — I5032 Chronic diastolic (congestive) heart failure: Secondary | ICD-10-CM

## 2021-01-10 DIAGNOSIS — A419 Sepsis, unspecified organism: Secondary | ICD-10-CM | POA: Diagnosis not present

## 2021-01-10 DIAGNOSIS — D72825 Bandemia: Secondary | ICD-10-CM

## 2021-01-10 LAB — RENAL FUNCTION PANEL
Albumin: 2.6 g/dL — ABNORMAL LOW (ref 3.5–5.0)
Anion gap: 9 (ref 5–15)
BUN: 22 mg/dL (ref 8–23)
CO2: 23 mmol/L (ref 22–32)
Calcium: 9.2 mg/dL (ref 8.9–10.3)
Chloride: 100 mmol/L (ref 98–111)
Creatinine, Ser: 1.06 mg/dL (ref 0.61–1.24)
GFR, Estimated: >60 mL/min (ref >60)
Glucose, Bld: 164 mg/dL — ABNORMAL HIGH (ref 70–99)
Phosphorus: 2.6 mg/dL (ref 2.5–4.6)
Potassium: 4 mmol/L (ref 3.5–5.1)
Sodium: 132 mmol/L — ABNORMAL LOW (ref 135–145)

## 2021-01-10 LAB — MAGNESIUM: Magnesium: 2.4 mg/dL (ref 1.7–2.4)

## 2021-01-10 LAB — CBC
HCT: 38.2 % — ABNORMAL LOW (ref 39.0–52.0)
Hemoglobin: 13 g/dL (ref 13.0–17.0)
MCH: 30.9 pg (ref 26.0–34.0)
MCHC: 34 g/dL (ref 30.0–36.0)
MCV: 90.7 fL (ref 80.0–100.0)
Platelets: 259 10*3/uL (ref 150–400)
RBC: 4.21 MIL/uL — ABNORMAL LOW (ref 4.22–5.81)
RDW: 12.8 % (ref 11.5–15.5)
WBC: 14.4 10*3/uL — ABNORMAL HIGH (ref 4.0–10.5)
nRBC: 0 % (ref 0.0–0.2)

## 2021-01-10 LAB — HEPATIC FUNCTION PANEL
ALT: 88 U/L — ABNORMAL HIGH (ref 0–44)
AST: 49 U/L — ABNORMAL HIGH (ref 15–41)
Albumin: 2.7 g/dL — ABNORMAL LOW (ref 3.5–5.0)
Alkaline Phosphatase: 48 U/L (ref 38–126)
Bilirubin, Direct: 0.2 mg/dL (ref 0.0–0.2)
Indirect Bilirubin: 0.6 mg/dL (ref 0.3–0.9)
Total Bilirubin: 0.8 mg/dL (ref 0.3–1.2)
Total Protein: 6.3 g/dL — ABNORMAL LOW (ref 6.5–8.1)

## 2021-01-10 MED ORDER — AMOXICILLIN-POT CLAVULANATE 875-125 MG PO TABS: 1.0000 | ORAL_TABLET | Freq: Two times a day (BID) | ORAL | 0 refills | Status: AC

## 2021-01-10 MED ORDER — SODIUM CHLORIDE 0.9 % IV SOLN
3.0000 g | Freq: Four times a day (QID) | INTRAVENOUS | Status: DC
  Administered 2021-01-10: 3 g via INTRAVENOUS
  Filled 2021-01-10: qty 8

## 2021-01-10 MED ORDER — NITROFURANTOIN MONOHYD MACRO 100 MG PO CAPS: 100.0000 mg | ORAL_CAPSULE | Freq: Every morning | ORAL | Status: AC

## 2021-01-10 NOTE — Progress Notes (Signed)
This rn arrived to room to find pt out of bathroom and back in chair, pt son in room. NT checked pt vss. This rn paged and notified Dr Cyndia Skeeters pt asymptomatic with HR 140s with increased activity and HR 120s at rest. This rn in room when md spoke to pt son by phone regarding HR and plan for them to f/u with pts cardiologist, pt son in agreement

## 2021-01-10 NOTE — Evaluation (Signed)
Physical Therapy Evaluation Patient Details Name: [name] [name] MRN: [id number] DOB: May 15, 1940 Today's Date: 01/10/2021  History of Present Illness  Pt is an 80 y/o male admitted secondary to fever, found to have severe sepsis secondary to a deep neck infection. PMH including but not limited to Alzheimer's, diastolic CHF, CAD, OSA on CPAP, HTN, HLD.   Clinical Impression  Pt presented sitting upright at EOB, awake and willing to participate in therapy session upon PT arrival. Prior to admission, pt reported that he ambulated with either a cane or RW and was independent with ADLs. He stated that he lives with his daughter in a two level home (basement) with a level entry. At the time of evaluation, pt overall moving well but limited secondary to elevated HR. HR increased to 140's with ambulation and quickly recovered back down to the 90's with sitting rest break. His RN was present and aware at the time. PT provided min guard throughout with all functional mobility with pt using RW to ambulate. No LOB throughout. Pt would continue to benefit from skilled physical therapy services at this time while admitted and after d/c to address the below listed limitations in order to improve overall safety and independence with functional mobility.      Recommendations for follow up therapy are one component of a multi-disciplinary discharge planning process, led by the attending physician.  Recommendations may be updated based on patient status, additional functional criteria and insurance authorization.  Follow Up Recommendations Home health PT    Assistance Recommended at Discharge Intermittent Supervision/Assistance  Functional Status Assessment Patient has had a recent decline in their functional status and demonstrates the ability to make significant improvements in function in a reasonable and predictable amount of time.  Equipment Recommendations  None recommended by PT    Recommendations for  Other Services       Precautions / Restrictions Precautions Precautions: Fall Restrictions Weight Bearing Restrictions: No      Mobility  Bed Mobility               General bed mobility comments: pt seated upright at EOB upon PT arrival    Transfers Overall transfer level: Needs assistance Equipment used: Rolling walker (2 wheels) Transfers: Sit to/from Stand Sit to Stand: Min guard           General transfer comment: pt using good, safe technique with RW to stand from EOB; required two attempts to successfully achieve a full upright standing position    Ambulation/Gait Ambulation/Gait assistance: Min guard Gait Distance (Feet): 75 Feet Assistive device: Rolling walker (2 wheels) Gait Pattern/deviations: Step-through pattern;Decreased stride length Gait velocity: decreased     General Gait Details: pt with slow, steady gait with use of RW and PT providing min guard for safety; limited secondary to elevated HR (up to 140's - RN aware)  Stairs            Wheelchair Mobility    Modified Rankin (Stroke Patients Only)       Balance Overall balance assessment: Needs assistance Sitting-balance support: Feet supported Sitting balance-Leahy Scale: Good     Standing balance support: During functional activity;Single extremity supported;Bilateral upper extremity supported Standing balance-Leahy Scale: Poor                               Pertinent Vitals/Pain Pain Assessment: No/denies pain    Home Living Family/patient expects to be discharged to:: Private residence Living  Arrangements: Children Available Help at Discharge: Family;Available PRN/intermittently Type of Home: House Home Access: Level entry       Home Layout: Able to live on main level with bedroom/bathroom;Other (Comment) (has a basement but does not need to go down to it) Home Equipment: Conservation officer, nature (2 wheels);Cane - single point      Prior Function Prior Level of  Function : Needs assist             Mobility Comments: pt requires use of a cane or RW to ambulate; does not drive ADLs Comments: wears glasses     Hand Dominance        Extremity/Trunk Assessment   Upper Extremity Assessment Upper Extremity Assessment: Overall WFL for tasks assessed    Lower Extremity Assessment Lower Extremity Assessment: Generalized weakness    Cervical / Trunk Assessment Cervical / Trunk Assessment: Kyphotic  Communication   Communication: No difficulties  Cognition Arousal/Alertness: Awake/alert Behavior During Therapy: WFL for tasks assessed/performed Overall Cognitive Status: History of cognitive impairments - at baseline                                 General Comments: hx of Alzheimer's        General Comments      Exercises     Assessment/Plan    PT Assessment Patient needs continued PT services  PT Problem List Decreased activity tolerance;Decreased balance;Decreased mobility;Decreased coordination;Decreased knowledge of use of DME;Decreased safety awareness;Decreased cognition;Cardiopulmonary status limiting activity       PT Treatment Interventions DME instruction;Gait training;Stair training;Functional mobility training;Therapeutic activities;Therapeutic exercise;Balance training;Neuromuscular re-education;Patient/family education    PT Goals (Current goals can be found in the Care Plan section)  Acute Rehab PT Goals Patient Stated Goal: to go home soon PT Goal Formulation: With patient Time For Goal Achievement: 01/24/21 Potential to Achieve Goals: Good    Frequency Min 3X/week   Barriers to discharge        Co-evaluation               AM-PAC PT "6 Clicks" Mobility  Outcome Measure Help needed turning from your back to your side while in a flat bed without using bedrails?: A Little Help needed moving from lying on your back to sitting on the side of a flat bed without using bedrails?: A  Little Help needed moving to and from a bed to a chair (including a wheelchair)?: None Help needed standing up from a chair using your arms (e.g., wheelchair or bedside chair)?: None Help needed to walk in hospital room?: None Help needed climbing 3-5 steps with a railing? : A Lot 6 Click Score: 20    End of Session Equipment Utilized During Treatment: Gait belt Activity Tolerance: Patient tolerated treatment well Patient left: in chair;with call bell/phone within reach;with chair alarm set Nurse Communication: Mobility status PT Visit Diagnosis: Other abnormalities of gait and mobility (R26.89)    Time: 8338-2505 PT Time Calculation (min) (ACUTE ONLY): 23 min   Charges:   PT Evaluation $PT Eval Moderate Complexity: 1 Mod PT Treatments $Gait Training: 8-22 mins        Anastasio Champion, DPT  Acute Rehabilitation Services Office [place] 01/10/2021, 10:04 AM

## 2021-01-10 NOTE — Discharge Summary (Signed)
Physician Discharge Summary  [name] [name] DVV:[id number] DOB: [birth year] DOA: 01/07/2021  PCP: Chesley Noon, MD  Admit date: 01/07/2021 Discharge date: 01/10/2021 Admitted From: Home. Disposition: Home Recommendations for Outpatient Follow-up:  Follow ups as below. Please obtain CBC/BMP/Mag at follow up Patient to follow-up with ENT, Dr. Wilburn Cornelia next week Patient to follow-up with his cardiologist to address A. fib Please follow up on the following pending results: None Home Health: Patient has established outpatient therapy Equipment/Devices: None indicated Discharge Condition: Stable CODE STATUS: Full code  Follow-up Information     Chesley Noon, MD. Schedule an appointment as soon as possible for a visit in 1 week(s).   Specialty: Family Medicine Contact information: [place] [address] [phone]         Martinique, Peter M, MD Follow up in 2 week(s).   Specialty: Cardiology Why: Atrial fibrillation Contact information: [address] [place] [address] [phone]         Jerrell Belfast, MD. Schedule an appointment as soon as possible for a visit in 1 week(s).   Specialty: Otolaryngology Contact information: [address] [place] [address] [phone]                Hospital Course: 80 year old M with PMH of Alzheimer's dementia, diastolic CHF, CAD, OSA on CPAP, HTN, HLD, parotiditis and recent "acute bronchitis) for which she was treated with Augmentin and prednisone taper course presenting with agitation, sore throat, productive cough, swelling about left jaw, and admitted with sepsis due to deep neck infection.  He was febrile to 102.  Leukocytosis 13.7.  Lactic acid 2.4 but improved to 1.4.  CT neck concerning for deep neck infection but no fluid collection or abscess.  Case discussed with ENT, Dr. Ebbie Latus who who recommended Decadron 8 mg IV every 8 hours for 3  doses, salivary agents and empiric antibiotics.  Cultures obtained.  Patient was started on IV vancomycin and Zosyn.   The next day, encephalopathy resolved.  Basically asymptomatic except for some swelling of her left parotid area.  He has no sore throat, dysphagia, odynophagia, trismus, difficulty breathing, neck pain or ear pain.  Full range of motion in his neck.  Blood cultures NGTD.  MRSA PCR negative.  Vancomycin discontinued.   On the day of discharge, he was de-escalated to IV Unasyn and discharged on p.o. Augmentin for 10 more days.  Patient to follow-up with his ENT, Dr. Wilburn Cornelia outpatient.    Of note, patient's telemetry showed arrhythmia concerning for A. Fib.  He is rate controlled but HR goes up to 140s with activity.  He is asymptomatic from this.  Review of his EKGs reveals what looks like an A. fib on 07/26/2020.  Discussed this with patient's daughter and son over the phone.  I recommended starting low-dose metoprolol.  I also discussed about anticoagulation, risk and benefit.  Family appreciated the info but prefers to wait and follow-up with his cardiologist.   Therapy recommended [facility].  However, daughter states that he is already established with outpatient therapy and currently declined home health.  See individual problem list below for more on hospital course.  Discharge Diagnoses:  Severe sepsis due to neck infection: POA.  Had fever, leukocytosis, lactic acidosis and AMS on presentation.  CXR raise concern for cardiomegaly and mild central vascular congestion.  CT neck with contrast concerning for deep infection of the neck with no signs of clear abscess.  Cultures NGTD.  MRSA PCR negative.  Patient is basically asymptomatic except for slight swelling over left parotid area.  Sepsis physiology resolved.  Mild leukocytosis likely demargination from steroid. -ENT, Dr.Skotnicki recommended Decadron 8 mg q8h x3 doses, salivary agents and empiric antibiotics -Vancomycin  9/11-10/2010.  Zosyn 9/11-10/2011.  Cefepime 9/13.  P.o. Augmentin for 10 more days. -Patient without trismus, dysphagia or respiratory distress.  FROM in his neck. -Patient to follow-up with ENT, Dr. Wilburn Cornelia outpatient.   Chronic diastolic CHF: Last TTE with LVEF of 60 to 65% and G1-DD.  Appears euvolemic on exam.  No cardiopulmonary symptoms.  CXR with cardiomegaly and mild vascular congestion.  BNP within normal.  No respiratory distress. -Patient to continue home Lasix on discharge.   Acute metabolic encephalopathy/agitation: Likely due to #1.  Could be from recent steroid as well.  Resolved. History of Alzheimer's dementia   AKI/azotemia: Likely from Vanco, Zosyn, lisinopril and Lasix.  Resolved. Recent Labs    07/29/20 0157 07/30/20 0204 07/31/20 0247 09/11/20 0817 09/12/20 0235 09/13/20 0418 01/07/21 2158 01/08/21 0838 01/09/21 0136 01/10/21 0140  BUN 19 20 17 21 22 14 22 18  25* 22  CREATININE 1.24 1.04 0.98 1.34* 1.28* 1.19 1.06 1.14 1.41* 1.06  -Resume home lisinopril and Lasix on discharge.  Paroxysmal A. Fib with mild intermittent RVR: HR in 80s to 90s for most part at rest but went up to 140 with exertion.  Patient was not symptomatic.  Could be from holding his Lasix due to AKI.  See discussion above with patient's family. -Patient to follow-up with his cardiologist   Hyponatremia: Likely from AKI on lisinopril.  Resolved.   Uncontrolled hypertension: I improved.  Normotensive. -Continue home lisinopril and Lasix on discharge   Transaminitis/hyperbilirubinemia: Likely from sepsis.  Could be from Crestor as well.  No GI symptoms.  Relatively stable. -Recheck CMP at follow-up   Hyperlipidemia -Continue Crestor but will stop if LFT worse   OSA -CPAP at night  Physical deconditioning -Patient to resume outpatient therapy  Class I obesity Body mass index is 30.34 kg/m.           Discharge Exam: Vitals:   01/10/21 0500 01/10/21 0606 01/10/21 0902  01/10/21 1242  BP:  116/62 130/69 136/79  Pulse:  62 71 (!) 124 Comment: nurse notified  Temp:  97.9 F (36.6 C) 97.6 F (36.4 C) 97.6 F (36.4 C)  Resp:  18 18 18   Height:      Weight: 95.9 kg     SpO2:  97% 98% 98%  TempSrc:  Axillary Axillary Oral  BMI (Calculated): 30.34        GENERAL: No apparent distress.  Nontoxic. HEENT: MMM.  Vision and hearing grossly intact.  Slight swelling over left parotid.  No erythema or tenderness.  No trismus. NECK: Full range of motion in his neck. RESP: 98% on RA.  No IWOB.  Fair aeration bilaterally. CVS:  RRR. Heart sounds normal.  ABD/GI/GU: Bowel sounds present. Soft. Non tender.  MSK/EXT:  Moves extremities. No apparent deformity.  Trace edema in BLE. SKIN: no apparent skin lesion or wound NEURO: Awake and alert.  Oriented appropriately.  No apparent focal neuro deficit. PSYCH: Calm. Normal affect.   Discharge Instructions  Discharge Instructions     Call MD for:  difficulty breathing, headache or visual disturbances   Complete by: As directed    Call MD for:  extreme fatigue   Complete by: As directed    Call MD for:  severe uncontrolled pain   Complete by: As directed    Call MD for:  temperature >100.4   Complete by: As directed    Diet - low sodium heart healthy   Complete by: As directed    Discharge instructions   Complete by: As directed    It has been a pleasure taking care of you!  You were hospitalized due to neck infection.  Your symptoms improved with IV antibiotics.  We are discharging you on oral antibiotics to complete treatment course.  It is very important that you take the whole course of antibiotics.  We recommend you call your ENT doctor, Dr. Victorio Palm office to arrange outpatient follow-up in the next 10 days.  We also noted you might have atrial fibrillation (irregular heartbeat). This does not seem to be new. We strongly recommend follow-up with your cardiologist for evaluation and treatment on this in  the next 1 to 2 weeks.     Take care,   Increase activity slowly   Complete by: As directed       Allergies as of 01/10/2021       Reactions   Enablex [darifenacin Hydrobromide Er]    PT STATES IT MAKES HIM "BONKERS" AND VERY TIRED   Ceftriaxone Other (See Comments)   Unknown reaction   Atorvastatin Other (See Comments)   Muscle aches/weakness   Ezetimibe Other (See Comments)   Muscle aches/weakness   Levaquin [levofloxacin] Diarrhea   Oxybutynin Other (See Comments)   Memory impairment, temperament changes   Sulfamethoxazole Other (See Comments)   Childhood allergy   Sulfasalazine Other (See Comments)   Childhood allergy   Sulfonamide Derivatives Other (See Comments)   Childhood allergy        Medication List     STOP taking these medications    predniSONE 10 MG tablet Commonly known as: DELTASONE       TAKE these medications    albuterol 108 (90 Base) MCG/ACT inhaler Commonly known as: VENTOLIN HFA Inhale 1 puff into the lungs every 4 (four) hours as needed for wheezing or shortness of breath.   amoxicillin-clavulanate 875-125 MG tablet Commonly known as: Augmentin Take 1 tablet by mouth 2 (two) times daily for 10 days.   aspirin 81 MG chewable tablet Chew 1 tablet (81 mg total) by mouth daily.   calcium-vitamin D 500-200 MG-UNIT tablet Commonly known as: OSCAL WITH D Take 1 tablet by mouth 2 (two) times daily.   docusate sodium 100 MG capsule Commonly known as: COLACE Take 200 mg by mouth 2 (two) times daily.   donepezil 10 MG tablet Commonly known as: ARICEPT Take 10 mg by mouth at bedtime.   Fish Oil 1200 MG Caps Take 1,200 mg by mouth 2 (two) times daily.   fluticasone 50 MCG/ACT nasal spray Commonly known as: FLONASE Place 2 sprays into both nostrils daily as needed for allergies.   furosemide 20 MG tablet Commonly known as: LASIX Take 1 tablet (20 mg total) by mouth daily.   lisinopril 20 MG tablet Commonly known as:  ZESTRIL Take 20 mg by mouth every morning.   METAMUCIL PO Take 3.4 g by mouth See admin instructions. Mix 3.4 grams of powder into 8 ounces of water or juice and drink once a day   mirabegron ER 50 MG Tb24 tablet Commonly known as: MYRBETRIQ Take 50 mg by mouth every morning.   multivitamin with minerals Tabs tablet Take 1 tablet by mouth every morning.   nitrofurantoin (macrocrystal-monohydrate) 100 MG  capsule Commonly known as: MACROBID Take 1 capsule (100 mg total) by mouth in the morning. Start taking on: January 23, 2021 What changed: These instructions start on January 23, 2021. If you are unsure what to do until then, ask your doctor or other care provider.   Osteo Bi-Flex/5-Loxin Advanced Tabs Take 1 tablet by mouth 2 (two) times daily.   PRESCRIPTION MEDICATION Inhale into the lungs at bedtime. CPAP   rosuvastatin 10 MG tablet Commonly known as: CRESTOR TAKE 1/2 TABLET BY MOUTH EVERY OTHER DAY   Toviaz 8 MG Tb24 tablet Generic drug: fesoterodine Take 8 mg by mouth every morning.        Consultations: ENT  Procedures/Studies: None   DG Chest 2 View  Result Date: 01/07/2021 CLINICAL DATA:  Fever and pneumonia. EXAM: CHEST - 2 VIEW COMPARISON:  Chest radiograph dated 12/29/2020. FINDINGS: Mild cardiomegaly and mild central vascular congestion. Minimal left lung base atelectasis. No pleural effusion pneumothorax. No acute osseous pathology. Degenerative changes of the spine. IMPRESSION: Mild cardiomegaly and mild central vascular congestion. Electronically Signed   By: Anner Crete M.D.   On: 01/07/2021 22:44   DG Chest 2 View  Result Date: 12/29/2020 CLINICAL DATA:  Cough with congestion. EXAM: CHEST - 2 VIEW COMPARISON:  Chest x-ray 09/11/2020. FINDINGS: Heart is enlarged. There is linear atelectasis in the left lung base. There is no pleural effusion or pneumothorax. Degenerative changes affect the spine and shoulders. IMPRESSION: 1. Left basilar  atelectasis. 2. Stable cardiomegaly. Electronically Signed   By: Ronney Asters M.D.   On: 12/29/2020 20:34   CT SOFT TISSUE NECK W CONTRAST  Result Date: 01/08/2021 CLINICAL DATA:  Parotid region mass recurrent parotiditits EXAM: CT NECK WITH CONTRAST TECHNIQUE: Multidetector CT imaging of the neck was performed using the standard protocol following the bolus administration of intravenous contrast. CONTRAST:  86mL OMNIPAQUE IOHEXOL 300 MG/ML  SOLN COMPARISON:  MRI neck July 30, 2020.  CT neck Jul 19, 2020. FINDINGS: Pharynx and larynx: No mass. Soft tissue stranding/edema in the neck is described below. Salivary glands: Stranding about the submandibular glands bilaterally, which is mild. The submandibular glands themselves are similar in size to the prior without obvious enlargement or edema. The left submandibular gland is chronically small. Bilateral parotid glands appear similar to prior without obvious mass. Multiple small intraparotid lymph nodes bilaterally. The parotid glands are not enlarged. Thyroid: 17 mm coarse calcification right thyroid lobe. There is ill-defined edema surrounding the thyroid, further described below. Lymph nodes: None enlarged or abnormal density. Vascular: Major arteries in the neck are patent. Scattered atherosclerotic narrowing, not well evaluated on this non-arterial timing study. Limited intracranial: Visualized brain is unremarkable without obvious acute abnormality. Visualized orbits: Not imaged. Mastoids and visualized paranasal sinuses: The imaged sinuses and mastoid air cells are clear. Skeleton: Multilevel degenerative change of the cervical spine. No obvious bone destruction/erosive change to suggest discitis/osteomyelitis. No evidence of acute fracture. No findings to suggest calcific tendinitis. Upper chest: Visualized lung apices are clear. No clear/definite stranding in the mediastinum to suggest mediastinitis. Other: Retropharyngeal edema measuring up to 7 mm in  thickness and extending from the craniocervical junction inferiorly to approximately C4-C5. Additionally, there is ill-defined edema/stranding in bilateral carotid spaces, surrounding the thyroid, bilateral submandibular spaces, and ill-defined stranding and thickening of the strap musculature. Mild thickening of the overlying platysma bilaterally with subcutaneous fat stranding. IMPRESSION: Abnormal retropharyngeal edema in the upper neck and new edema/stranding throughout the neck, including the carotid spaces, submandibular spaces  and surrounding/involving the strap musculature and surrounding the thyroid and superficial subcutaneous fat. Findings are compatible with a deep neck infection. No discrete, drainable fluid collection identified. No obvious primary source of infection. These results will be called to the ordering clinician or representative by the Radiologist Assistant, and communication documented in the PACS or [employer]. Electronically Signed   By: Margaretha Sheffield M.D.   On: 01/08/2021 10:58       The results of significant diagnostics from this hospitalization (including imaging, microbiology, ancillary and laboratory) are listed below for reference.     Microbiology: Recent Results (from the past 240 hour(s))  Resp Panel by RT-PCR (Flu A&B, Covid) Nasopharyngeal Swab     Status: None   Collection Time: 01/07/21  9:58 PM   Specimen: Nasopharyngeal Swab; Nasopharyngeal(NP) swabs in vial transport medium  Result Value Ref Range Status   SARS Coronavirus 2 by RT PCR NEGATIVE NEGATIVE Final    Comment: (NOTE) SARS-CoV-2 target nucleic acids are NOT DETECTED.  The SARS-CoV-2 RNA is generally detectable in upper respiratory specimens during the acute phase of infection. The lowest concentration of SARS-CoV-2 viral copies this assay can detect is 138 copies/mL. A negative result does not preclude SARS-Cov-2 infection and should not be used as the sole basis for treatment  or other patient management decisions. A negative result may occur with  improper specimen collection/handling, submission of specimen other than nasopharyngeal swab, presence of viral mutation(s) within the areas targeted by this assay, and inadequate number of viral copies(<138 copies/mL). A negative result must be combined with clinical observations, patient history, and epidemiological information. The expected result is Negative.  Fact Sheet for Patients:  EntrepreneurPulse.com.au  Fact Sheet for Healthcare Providers:  IncredibleEmployment.be  This test is no t yet approved or cleared by the [place] FDA and  has been authorized for detection and/or diagnosis of SARS-CoV-2 by FDA under an Emergency Use Authorization (EUA). This EUA will remain  in effect (meaning this test can be used) for the duration of the COVID-19 declaration under Section 564(b)(1) of the Act, 21 U.S.C.section 360bbb-3(b)(1), unless the authorization is terminated  or revoked sooner.       Influenza A by PCR NEGATIVE NEGATIVE Final   Influenza B by PCR NEGATIVE NEGATIVE Final    Comment: (NOTE) The Xpert Xpress SARS-CoV-2/FLU/RSV plus assay is intended as an aid in the diagnosis of influenza from Nasopharyngeal swab specimens and should not be used as a sole basis for treatment. Nasal washings and aspirates are unacceptable for Xpert Xpress SARS-CoV-2/FLU/RSV testing.  Fact Sheet for Patients: EntrepreneurPulse.com.au  Fact Sheet for Healthcare Providers: IncredibleEmployment.be  This test is not yet approved or cleared by the [place] FDA and has been authorized for detection and/or diagnosis of SARS-CoV-2 by FDA under an Emergency Use Authorization (EUA). This EUA will remain in effect (meaning this test can be used) for the duration of the COVID-19 declaration under Section 564(b)(1) of the Act, 21 U.S.C. section  360bbb-3(b)(1), unless the authorization is terminated or revoked.  Performed at [facility], [address], [place], [place] [address]   Blood Culture (routine x 2)     Status: None (Preliminary result)   Collection Time: 01/08/21 12:22 AM   Specimen: BLOOD  Result Value Ref Range Status   Specimen Description   Final    BLOOD BOTTLES DRAWN AEROBIC AND ANAEROBIC Performed at [facility], [address], [place], [place] [address]    Special Requests  Final    Blood Culture adequate volume LEFT ANTECUBITAL Performed at [facility], [address], [place], [place] [address]    Culture   Final    NO GROWTH 2 DAYS Performed at [facility], [place] [address], [place], [place] [address]    Report Status PENDING  Incomplete  Blood Culture (routine x 2)     Status: None (Preliminary result)   Collection Time: 01/08/21  1:51 AM   Specimen: BLOOD  Result Value Ref Range Status   Specimen Description   Final    BLOOD BOTTLES DRAWN AEROBIC AND ANAEROBIC Performed at [facility], [address], [place], [place] [phone]    Special Requests   Final    Blood Culture results may not be optimal due to an inadequate volume of blood received in culture bottles Performed at [facility], [address] [place], [place] [address]    Culture   Final    NO GROWTH 2 DAYS Performed at [facility], [place] [address], [place], [place] [address]    Report Status PENDING  Incomplete  Urine Culture     Status: None   Collection Time: 01/08/21  1:52 AM   Specimen: Urine, Catheterized  Result Value Ref Range Status   Specimen Description   Final    URINE, CATHETERIZED Performed at [facility], [address], [place], [place] [address]    Special Requests   Final    NONE Performed at [facility], [address], [place], [place] [address]     Culture   Final    NO GROWTH Performed at [facility], [place] [address], [place], [place] [address]    Report Status 01/09/2021 FINAL  Final  MRSA Next Gen by PCR, Nasal     Status: None   Collection Time: 01/08/21  8:42 AM   Specimen: Nasal Mucosa; Nasal Swab  Result Value Ref Range Status   MRSA by PCR Next Gen NOT DETECTED NOT DETECTED Final    Comment: (NOTE) The GeneXpert MRSA Assay (FDA approved for NASAL specimens only), is one component of a comprehensive MRSA colonization surveillance program. It is not intended to diagnose MRSA infection nor to guide or monitor treatment for MRSA infections. Test performance is not FDA approved in patients less than 37 years old. Performed at [facility], [place] [address], [place], [place] [address]      Labs:  CBC: Recent Labs  Lab 01/07/21 2158 01/08/21 0838 01/09/21 0136 01/10/21 0140  WBC 13.7* 11.4* 7.9 14.4*  NEUTROABS 11.0* 8.8*  --   --   HGB 15.1 13.5 13.2 13.0  HCT 45.1 40.7 38.7* 38.2*  MCV 92.2 93.1 91.9 90.7  PLT 246 246 227 259   BMP &GFR Recent Labs  Lab 01/07/21 2158 01/08/21 0838 01/09/21 0136 01/10/21 0140  NA 136 135 132* 132*  K 3.9 3.8 4.1 4.0  CL 99 100 102 100  CO2 25 27 23 23   GLUCOSE 132* 134* 239* 164*  BUN 22 18 25* 22  CREATININE 1.06 1.14 1.41* 1.06  CALCIUM 10.2 9.5 8.9 9.2  MG  --   --   --  2.4  PHOS  --   --   --  2.6   Estimated Creatinine Clearance: 62.5 mL/min (by C-G formula based on SCr of 1.06 mg/dL). Liver & Pancreas: Recent Labs  Lab 01/07/21 2158 01/08/21 0838 01/10/21 0140  AST 37 55* 49*  ALT 57* 77* 88*  ALKPHOS 64 55 48  BILITOT 0.9 1.5* 0.8  PROT 7.7 6.3* 6.3*  ALBUMIN 4.2 2.9* 2.7*  2.6*   No results for input(s): LIPASE, AMYLASE in the last 168 hours. No results for input(s): AMMONIA in the last 168 hours. Diabetic: No results for input(s): HGBA1C in the last 72 hours. No results for input(s): GLUCAP in the last 168 hours. Cardiac  Enzymes: No results for input(s): CKTOTAL, CKMB, CKMBINDEX, TROPONINI in the last 168 hours. Recent Labs    04/21/20 1510  PROBNP 100   Coagulation Profile: Recent Labs  Lab 01/08/21 0152  INR 1.1   Thyroid Function Tests: No results for input(s): TSH, T4TOTAL, FREET4, T3FREE, THYROIDAB in the last 72 hours. Lipid Profile: No results for input(s): CHOL, HDL, LDLCALC, TRIG, CHOLHDL, LDLDIRECT in the last 72 hours. Anemia Panel: No results for input(s): VITAMINB12, FOLATE, FERRITIN, TIBC, IRON, RETICCTPCT in the last 72 hours. Urine analysis:    Component Value Date/Time   COLORURINE YELLOW 01/08/2021 0151   APPEARANCEUR CLEAR 01/08/2021 0151   LABSPEC 1.018 01/08/2021 0151   PHURINE 6.5 01/08/2021 0151   GLUCOSEU NEGATIVE 01/08/2021 0151   HGBUR NEGATIVE 01/08/2021 0151   BILIRUBINUR NEGATIVE 01/08/2021 0151   KETONESUR NEGATIVE 01/08/2021 0151   PROTEINUR NEGATIVE 01/08/2021 0151   NITRITE NEGATIVE 01/08/2021 0151   LEUKOCYTESUR NEGATIVE 01/08/2021 0151   Sepsis Labs: Invalid input(s): PROCALCITONIN, LACTICIDVEN   Time coordinating discharge: 45 minutes  SIGNED:  Mercy Riding, MD  [facility] 01/10/2021, 2:41 PM

## 2021-01-10 NOTE — Progress Notes (Signed)
Pt hr 140s afib on cardiac monitor. This rn arrived to room to find pt on toilet and pt son in room. This rn visualized pt, pt denies cp/dizziness/lightheadedness, pt endorses baseline sob.

## 2021-01-11 NOTE — Telephone Encounter (Signed)
Looks like he was admitted with an infection and encephalopathy related to that. From a cardiac standpoint he did develop Afib with RVR during his hospital stay. Was put on metoprolol. Anticoagulation deferred until follow up with [place]. I don't think this would explain his SOB over the past year but he should be seen to address the Afib. Can see me or APP.  Dhara Schepp [place] MD, [facility]

## 2021-01-11 NOTE — Telephone Encounter (Signed)
Spoke to patient's daughter Elzie Rings.She stated she is very concerned about father.Stated he is not acting his self.Stated she knows something is wrong.Stated she never started him on metoprolol.She does not know B/P and pulse.Advised to check B/P and pulse daily.Keep appointment already scheduled with Fabian Sharp PA 11/17 at 8:25 am.Advised to bring B/P readings to appointment and bring all medications.Appointment scheduled with Dr.Jordan 12/5 at 3:30 pm.Advised to go to ED if he worsens.

## 2021-01-12 NOTE — Progress Notes (Signed)
Cardiology Office Note:    Date:  01/14/2021   ID:  [name] [name], DOB January 10, 1941, MRN [id number]  PCP:  [name] Noon, MD  Cardiologist:  [name] Martinique, MD   Referring MD: [name] Noon, MD   Chief Complaint  Patient presents with   Follow-up    dyspnea    History of Present Illness:    [name] [name] is a 80 y.o. male with a hx of Alzheimer's disease, diastolic heart failure, OSA on CPAP, hypertension, hyperlipidemia, moderate nonobstructive CAD on cardiac catheterization in 2010, chronic shortness of breath, left bundle branch block, CKD stage III, and newly recognized A. fib.  FFR on LAD in 2010 was negative, CAD treated with medical therapy.  Nuclear stress test in May 2020 completed for evaluation of new LBBB on EKG was low risk with no evidence of ischemia.    He was recently treated for parotitis (ABX) and was admitted 5/29-07/31/20 for this. He was followed by ENT.   Patient was last seen in our clinic on 09/07/2020 by [name] Rives, PA-C and complained of shortness of breath..  His daughter was also concerned because they were told he had CHF and A. fib.  Unclear CHF diagnosis, patient does have mild diastolic dysfunction.  He does take Lasix daily.  Ms. [name] [name] did not feel his symptoms were consistent with CHF.  They did discuss nuclear stress test to rule out an anginal equivalent.  Through shared decision making, they decided to try PT first.  In review of EKGs, artifact makes definitive interpretation difficult.  However, I agree that P waves are present, although possibly different morphologies.  Question wandering pacemaker and PACs.  He was in sinus rhythm in July 2022.  He was admitted 09/11/2020 and treated for UTI.   He presented to the ER 01/07/21 with agitation, sore throat, productive cough, swelling of his left jaw and was admitted with sepsis due to deep neck infection.  Neuropathy resolved with Decadron and antibiotics.  Telemetry and EKG revealed atrial  fibrillation.   Anticoagulation was discussed but family preferred to speak with cardiology before starting anticoagulation.  He was rate controlled while in the hospital, however heart rates in the 140s with any activity.  The patient was asymptomatic.  Low-dose metoprolol was discussed, but this was not started.  The patient's daughter called our office to report that he is walking 1.5 to 2 miles per day but is very short of breath with this activity.  He was placed on my schedule to discuss A. fib.  He presents today with his son. Unfortunately, his daughter is not able to be at the appt and could not call.  We discussed his new diagnosis of atrial fibrillation, rate control, and stroke prevention.  We also discussed his bleeding risk.  He has not had falls at home.  He does try to walk daily using a cane or a walker.  His dementia appears mild today, he is able to participate in questions and is oriented.  He describes shortness of breath when he walks but also at times has dyspnea at rest.  We reviewed his nuclear stress test in 2020 that was nonischemic.  He denies chest pain and palpitations.   Past Medical History:  Diagnosis Date   Alzheimer disease ([place])    Chronic kidney disease    mild insuffiency   Coronary artery disease    LHC 4/10: Mid LAD 40-50%, then 70%, OM1 20-30%, EF 65%. Mid LAD FFR 0.89 (not  hemodynamically significant). Medical therapy was continued.   Dysuria 08/10/2020   Hyperlipidemia    LBBB (left bundle branch block) 06/2018   Obstructive sleep apnea     Past Surgical History:  Procedure Laterality Date   CARDIAC CATHETERIZATION  05/02/2003   single vessel,moderate stenosis mid LAD   CARDIAC CATHETERIZATION  06/12/2008   continue med. therapy   right shoulder surgery     TOTAL HIP ARTHROPLASTY Left 09/14/2018   Procedure: LEFT TOTAL HIP ARTHROPLASTY ANTERIOR APPROACH;  Surgeon: Mcarthur Rossetti, MD;  Location: [facility];  Service: Orthopedics;  Laterality: Left;     Current Medications: Current Meds  Medication Sig   albuterol (VENTOLIN HFA) 108 (90 Base) MCG/ACT inhaler Inhale 1 puff into the lungs every 4 (four) hours as needed for wheezing or shortness of breath.   amoxicillin-clavulanate (AUGMENTIN) 875-125 MG tablet Take 1 tablet by mouth 2 (two) times daily for 10 days.   apixaban (ELIQUIS) 5 MG TABS tablet Take 1 tablet (5 mg total) by mouth 2 (two) times daily.   aspirin 81 MG chewable tablet Chew 1 tablet (81 mg total) by mouth daily.   calcium-vitamin D (OSCAL WITH D) 500-200 MG-UNIT per tablet Take 1 tablet by mouth 2 (two) times daily.   docusate sodium (COLACE) 100 MG capsule Take 200 mg by mouth 2 (two) times daily.   donepezil (ARICEPT) 10 MG tablet Take 10 mg by mouth at bedtime.   fesoterodine (TOVIAZ) 8 MG TB24 tablet Take 8 mg by mouth every morning.   fluticasone (FLONASE) 50 MCG/ACT nasal spray Place 2 sprays into both nostrils daily as needed for allergies.   furosemide (LASIX) 20 MG tablet Take 1 tablet (20 mg total) by mouth daily.   lisinopril (ZESTRIL) 20 MG tablet Take 20 mg by mouth every morning.   metoprolol tartrate (LOPRESSOR) 25 MG tablet Take 1 tablet (25 mg total) by mouth 2 (two) times daily.   mirabegron ER (MYRBETRIQ) 50 MG TB24 tablet Take 50 mg by mouth every morning.   Misc Natural Products (OSTEO BI-FLEX/5-LOXIN ADVANCED) TABS Take 1 tablet by mouth 2 (two) times daily.   Multiple Vitamin (MULTIVITAMIN WITH MINERALS) TABS tablet Take 1 tablet by mouth every morning.   [START ON 01/23/2021] nitrofurantoin, macrocrystal-monohydrate, (MACROBID) 100 MG capsule Take 1 capsule (100 mg total) by mouth in the morning.   Omega-3 Fatty Acids (FISH OIL) 1200 MG CAPS Take 1,200 mg by mouth 2 (two) times daily.   PRESCRIPTION MEDICATION Inhale into the lungs at bedtime. CPAP   Psyllium (METAMUCIL PO) Take 3.4 g by mouth See admin instructions. Mix 3.4 grams of powder into 8 ounces of water or juice and drink once a day    rosuvastatin (CRESTOR) 10 MG tablet TAKE 1/2 TABLET BY MOUTH EVERY OTHER DAY (Patient taking differently: Take 5 mg by mouth every other day.)     Allergies:   Enablex [darifenacin hydrobromide er], Ceftriaxone, Atorvastatin, Ezetimibe, Levaquin [levofloxacin], Oxybutynin, Sulfamethoxazole, Sulfasalazine, and Sulfonamide derivatives   Social History   Socioeconomic History   Marital status: Widowed    Spouse name: Not on file   Number of children: 2   Years of education: Not on file   Highest education level: Not on file  Occupational History   Occupation: [employer]  Tobacco Use   Smoking status: Former    Types: Pipe    Quit date: 02/28/1990    Years since quitting: 30.8   Smokeless tobacco: Never   Tobacco comments:  smoked pipe only   Vaping Use   Vaping Use: Never used  Substance and Sexual Activity   Alcohol use: No    Alcohol/week: 0.0 standard drinks    Comment: rarely wine   Drug use: No   Sexual activity: Not on file  Other Topics Concern   Not on file  Social History Narrative   Not on file   Social Determinants of Health   Financial Resource Strain: Not on file  Food Insecurity: Not on file  Transportation Needs: Not on file  Physical Activity: Not on file  Stress: Not on file  Social Connections: Not on file     Family History: The patient's family history includes Cancer in his mother; Hypertension in his father and mother; Other in his father.  ROS:   Please see the history of present illness.     All other systems reviewed and are negative.  EKGs/Labs/Other Studies Reviewed:    The following studies were reviewed today:  Echo 04/2020: 1. Left ventricular ejection fraction, by estimation, is 60 to 65%. The  left ventricle has normal function. The left ventricle has no regional  wall motion abnormalities. There is mild left ventricular hypertrophy.  Left ventricular diastolic parameters  are consistent with Grade I diastolic  dysfunction (impaired relaxation).   2. Right ventricular systolic function is normal. The right ventricular  size is normal.   3. The mitral valve is normal in structure. Mild mitral valve  regurgitation. No evidence of mitral stenosis.   4. The aortic valve is tricuspid. Aortic valve regurgitation is trivial.  Mild aortic valve sclerosis is present, with no evidence of aortic valve  stenosis.   5. The inferior vena cava is normal in size with greater than 50%  respiratory variability, suggesting right atrial pressure of 3 mmHg.   EKG:  EKG is ordered today.  The ekg ordered today demonstrates sinus rhythm with HR 75, LBBB (old)  Recent Labs: 04/21/2020: NT-Pro BNP 100 01/08/2021: B Natriuretic Peptide 61.4 01/10/2021: ALT 88; BUN 22; Creatinine, Ser 1.06; Hemoglobin 13.0; Magnesium 2.4; Platelets 259; Potassium 4.0; Sodium 132  Recent Lipid Panel No results found for: CHOL, TRIG, HDL, CHOLHDL, VLDL, LDLCALC, LDLDIRECT  Physical Exam:    VS:  BP (!) 144/76   Pulse 75   Ht 5\' 10"  (1.778 m)   Wt 208 lb 6.4 oz (94.5 kg)   SpO2 99%   BMI 29.90 kg/m     Wt Readings from Last 3 Encounters:  01/14/21 208 lb 6.4 oz (94.5 kg)  01/10/21 211 lb 6.7 oz (95.9 kg)  12/29/20 204 lb 2.3 oz (92.6 kg)     GEN:  Well nourished, well developed in no acute distress HEENT: Normal NECK: No JVD; No carotid bruits LYMPHATICS: No lymphadenopathy CARDIAC: RRR, no murmurs, rubs, gallops RESPIRATORY:  Clear to auscultation without rales, wheezing or rhonchi  ABDOMEN: Soft, non-tender, non-distended MUSCULOSKELETAL:  1+ B LE edema; No deformity  SKIN: Warm and dry NEUROLOGIC:  Alert and oriented x 3 PSYCHIATRIC:  Normal affect   ASSESSMENT:    1. Paroxysmal atrial fibrillation (HCC)   2. Encounter for anticoagulation discussion and counseling   3. Primary hypertension   4. Shortness of breath   5. Coronary artery disease involving native coronary artery of native heart with angina pectoris  ([place])   6. Chronic diastolic CHF (congestive heart failure) (HCC)    PLAN:    In order of problems listed above:  Newly recognized atrial fibrillation - I  have reviewed EKGs, there is evidence of Afib - EKG today with normal sinus rhythm -He describes ongoing dyspnea on exertion, but no palpitations, dizziness, near syncope or syncope   Need for anticoagulation This patients CHA2DS2-VASc Score and unadjusted Ischemic Stroke Rate (% per year) is equal to 9.7 % stroke rate/year from a score of 6 (CHF, 2age, CAD, DM, HTN). HASBLED score = 4 -->  8.9% risk of bleeding - would qualify for full dose eliquis 5 mg BID I had a long discussion with the patient and son regarding anticoagulation versus bleeding risk.  They will discuss with his daughter and decide if they want to start Eliquis.  I have advised them to stop aspirin if he does start Eliquis.    Chronic diastolic heart failure - mild swelling in LE, has not been wearing compression stockings - echo earlier this year reassuring - did not appear extremely volume up, but could consider increasing lasix at next visit to see if this helps his dyspnea - I did not do this today because they are very cautious about starting/changing too many medications, which is very reasonable given his age and dementia   Shortness of breath - echo 04/2020 with preserved EF and mild DD, no significant valvular issues - maintained on 20 mg lasix  Dyspnea has been going on for greater than 1 year.  He has had CXRs in the hospital, largely unrevealing.  Potential causes could be RVR versus an anginal equivalent vs HFpEF.  Through shared decision making, he has decided to start metoprolol 25 mg twice daily.  They will discuss and decide about Eliquis.  If he continues to have dyspnea after starting metoprolol, we can then consider placing a heart monitor to evaluate A. fib burden and RVR.  We may also need to consider repeat nuclear stress test.  However, it  is unclear to me if he and his family would want a heart catheterization should the stress test be abnormal.  I would like for them to discuss this with his primary cardiologist.  I have also asked them to ask PCP this afternoon about the possible pulmonology referral.   Hypertension - continue 20 mg lisinopril -Start low-dose metoprolol as above   Follow-up with Dr. Martinique in 1 month.   Medication Adjustments/Labs and Tests Ordered: Current medicines are reviewed at length with the patient today.  Concerns regarding medicines are outlined above.  Orders Placed This Encounter  Procedures   EKG 12-Lead   Meds ordered this encounter  Medications   metoprolol tartrate (LOPRESSOR) 25 MG tablet    Sig: Take 1 tablet (25 mg total) by mouth 2 (two) times daily.    Dispense:  180 tablet    Refill:  3   apixaban (ELIQUIS) 5 MG TABS tablet    Sig: Take 1 tablet (5 mg total) by mouth 2 (two) times daily.    Dispense:  60 tablet    Refill:  2    Signed, Ledora Bottcher, PA  01/14/2021 9:59 AM    [facility]

## 2021-01-13 LAB — CULTURE, BLOOD (ROUTINE X 2)
Culture: NO GROWTH
Culture: NO GROWTH
Special Requests: ADEQUATE

## 2021-01-14 ENCOUNTER — Encounter: Payer: Self-pay | Admitting: Physician Assistant

## 2021-01-14 ENCOUNTER — Ambulatory Visit: Payer: Medicare Other | Admitting: Physician Assistant

## 2021-01-14 ENCOUNTER — Other Ambulatory Visit: Payer: Self-pay

## 2021-01-14 VITALS — BP 144/76 | HR 75 | Ht 70.0 in | Wt 208.4 lb

## 2021-01-14 DIAGNOSIS — I25119 Atherosclerotic heart disease of native coronary artery with unspecified angina pectoris: Secondary | ICD-10-CM

## 2021-01-14 DIAGNOSIS — Z7189 Other specified counseling: Secondary | ICD-10-CM

## 2021-01-14 DIAGNOSIS — I10 Essential (primary) hypertension: Secondary | ICD-10-CM | POA: Diagnosis not present

## 2021-01-14 DIAGNOSIS — I480 Paroxysmal atrial fibrillation: Secondary | ICD-10-CM

## 2021-01-14 DIAGNOSIS — I5032 Chronic diastolic (congestive) heart failure: Secondary | ICD-10-CM | POA: Diagnosis not present

## 2021-01-14 DIAGNOSIS — R0602 Shortness of breath: Secondary | ICD-10-CM | POA: Diagnosis not present

## 2021-01-14 MED ORDER — METOPROLOL TARTRATE 25 MG PO TABS: 25.0000 mg | ORAL_TABLET | Freq: Two times a day (BID) | ORAL | 3 refills | Status: DC

## 2021-01-14 MED ORDER — APIXABAN 5 MG PO TABS: 5.0000 mg | ORAL_TABLET | Freq: Two times a day (BID) | ORAL | 2 refills | Status: DC

## 2021-01-14 NOTE — Patient Instructions (Signed)
Medication Instructions:  Start Metoprolol Tartrate 25 mg ( 1 Tablet Twice Daily). Discuss Anticoagulant Therapy . Start Eliquis 5 mg 1 Tablet Twice Daily . Stop Asprin. *If you need a refill on your cardiac medications before your next appointment, please call your pharmacy*   Lab Work: No Labs If you have labs (blood work) drawn today and your tests are completely normal, you will receive your results only by: [place] (if you have MyChart) OR A paper copy in the mail If you have any lab test that is abnormal or we need to change your treatment, we will call you to review the results.   Testing/Procedures: No Testing   Follow-Up: At [facility], you and your health needs are our priority.  As part of our continuing mission to provide you with exceptional heart care, we have created designated Provider Care Teams.  These Care Teams include your primary Cardiologist (physician) and Advanced Practice Providers (APPs -  Physician Assistants and Nurse Practitioners) who all work together to provide you with the care you need, when you need it.  We recommend signing up for the patient portal called "MyChart".  Sign up information is provided on this After Visit Summary.  MyChart is used to connect with patients for Virtual Visits (Telemedicine).  Patients are able to view lab/test results, encounter notes, upcoming appointments, etc.  Non-urgent messages can be sent to your provider as well.   To learn more about what you can do with MyChart, go to [email or web address].    Your next appointment:   1 month(s)  The format for your next appointment:   In Person  Provider:   Fabian Sharp, PA-C    Then, Peter Martinique, MD will plan to see you again in 1 month(s).

## 2021-01-30 NOTE — Progress Notes (Signed)
Cardiology Office Note:    Date:  02/01/2021   ID:  [name] [name], DOB August 27, 1940, MRN [id number]  PCP:  Chesley Noon, MD  Cardiologist:  Vanderbilt Ranieri [place], MD   Referring MD: Chesley Noon, MD   Chief Complaint  Patient presents with   Shortness of Breath     History of Present Illness:    [name] [name] is a 80 y.o. male with a hx of Alzheimer's disease, diastolic heart failure, OSA on CPAP, hypertension, hyperlipidemia, moderate nonobstructive CAD on cardiac catheterization in 2010, chronic shortness of breath, left bundle branch block, CKD stage III, and A. fib.  FFR on LAD in 2010 was negative, CAD treated with medical therapy.  Nuclear stress test in May 2020 completed for evaluation of new LBBB on EKG was low risk with no evidence of ischemia.    He was treated for parotitis (ABX) and was admitted 5/29-07/31/20 for this. He was followed by ENT.   Patient was seen in our clinic on 09/07/2020 by [name] Rives, PA-C and complained of shortness of breath..  His daughter was also concerned because they were told he had CHF and A. fib.  Unclear CHF diagnosis, patient does have mild diastolic dysfunction.  He does take Lasix daily.  Ms. [name] [name] did not feel his symptoms were consistent with CHF.  They did discuss nuclear stress test to rule out an anginal equivalent.  Through shared decision making, they decided to try PT first.  In review of EKGs, artifact makes definitive interpretation difficult. It appears there were P waves and not Afib.  Question wandering pacemaker and PACs.  He was in sinus rhythm in July 2022.  He was admitted 09/11/2020 and treated for UTI.   He presented to the ER 01/07/21 with agitation, sore throat, productive cough, swelling of his left jaw and was admitted with sepsis due to deep neck infection.  This resolved with Decadron and antibiotics.  Telemetry and EKG revealed atrial fibrillation.   Anticoagulation was discussed but family preferred to speak with  cardiology before starting anticoagulation.  He was rate controlled while in the hospital, however heart rates in the 140s with any activity.  The patient was asymptomatic.  Low-dose metoprolol was discussed, but this was not started.  The patient's daughter called our office to report that he is walking 1.5 to 2 miles per day but is very short of breath with this activity.  He was subsequently seen in our office by [name] Sharp PA-C. He was back in NSR. Anticoagulation was discussed with son who was present and this was to be discussed further with daughter. He has since started on Eliquis.   On follow up today he is seen with his son. He still notes SOB at times. Worse when bending over or turning. Sometimes at rest. Prior to last infection was walking up to 2 miles/day. States it is too cold now. Denies any palpitations.     Past Medical History:  Diagnosis Date   Alzheimer disease ([place])    Chronic kidney disease    mild insuffiency   Coronary artery disease    LHC 4/10: Mid LAD 40-50%, then 70%, OM1 20-30%, EF 65%. Mid LAD FFR 0.89 (not hemodynamically significant). Medical therapy was continued.   Dysuria 08/10/2020   Hyperlipidemia    LBBB (left bundle branch block) 06/2018   Obstructive sleep apnea     Past Surgical History:  Procedure Laterality Date   CARDIAC CATHETERIZATION  05/02/2003   single vessel,moderate  stenosis mid LAD   CARDIAC CATHETERIZATION  06/12/2008   continue med. therapy   right shoulder surgery     TOTAL HIP ARTHROPLASTY Left 09/14/2018   Procedure: LEFT TOTAL HIP ARTHROPLASTY ANTERIOR APPROACH;  Surgeon: Mcarthur Rossetti, MD;  Location: [facility];  Service: Orthopedics;  Laterality: Left;    Current Medications: Current Meds  Medication Sig   albuterol (VENTOLIN HFA) 108 (90 Base) MCG/ACT inhaler Inhale 1 puff into the lungs every 4 (four) hours as needed for wheezing or shortness of breath.   apixaban (ELIQUIS) 5 MG TABS tablet Take 1 tablet (5 mg total)  by mouth 2 (two) times daily.   calcium-vitamin D (OSCAL WITH D) 500-200 MG-UNIT per tablet Take 1 tablet by mouth 2 (two) times daily.   docusate sodium (COLACE) 100 MG capsule Take 200 mg by mouth 2 (two) times daily.   donepezil (ARICEPT) 10 MG tablet Take 10 mg by mouth at bedtime.   fesoterodine (TOVIAZ) 8 MG TB24 tablet Take 8 mg by mouth every morning.   fluticasone (FLONASE) 50 MCG/ACT nasal spray Place 2 sprays into both nostrils daily as needed for allergies.   furosemide (LASIX) 20 MG tablet Take 1 tablet (20 mg total) by mouth daily.   lisinopril (ZESTRIL) 20 MG tablet Take 20 mg by mouth every morning.   metoprolol tartrate (LOPRESSOR) 25 MG tablet Take 1 tablet (25 mg total) by mouth 2 (two) times daily.   mirabegron ER (MYRBETRIQ) 50 MG TB24 tablet Take 50 mg by mouth every morning.   Misc Natural Products (OSTEO BI-FLEX/5-LOXIN ADVANCED) TABS Take 1 tablet by mouth 2 (two) times daily.   Multiple Vitamin (MULTIVITAMIN WITH MINERALS) TABS tablet Take 1 tablet by mouth every morning.   nitrofurantoin, macrocrystal-monohydrate, (MACROBID) 100 MG capsule Take 1 capsule (100 mg total) by mouth in the morning.   Omega-3 Fatty Acids (FISH OIL) 1200 MG CAPS Take 1,200 mg by mouth 2 (two) times daily.   PRESCRIPTION MEDICATION Inhale into the lungs at bedtime. CPAP   Psyllium (METAMUCIL PO) Take 3.4 g by mouth See admin instructions. Mix 3.4 grams of powder into 8 ounces of water or juice and drink once a day   rosuvastatin (CRESTOR) 10 MG tablet TAKE 1/2 TABLET BY MOUTH EVERY OTHER DAY (Patient taking differently: Take 5 mg by mouth every other day.)   [DISCONTINUED] aspirin 81 MG chewable tablet Chew 1 tablet (81 mg total) by mouth daily.     Allergies:   Enablex [darifenacin hydrobromide er], Ceftriaxone, Atorvastatin, Ezetimibe, Levaquin [levofloxacin], Oxybutynin, Sulfamethoxazole, Sulfasalazine, and Sulfonamide derivatives   Social History   Socioeconomic History   Marital  status: Widowed    Spouse name: Not on file   Number of children: 2   Years of education: Not on file   Highest education level: Not on file  Occupational History   Occupation: [employer]  Tobacco Use   Smoking status: Former    Types: Pipe    Quit date: 02/28/1990    Years since quitting: 30.9   Smokeless tobacco: Never   Tobacco comments:    smoked pipe only   Vaping Use   Vaping Use: Never used  Substance and Sexual Activity   Alcohol use: No    Alcohol/week: 0.0 standard drinks    Comment: rarely wine   Drug use: No   Sexual activity: Not on file  Other Topics Concern   Not on file  Social History Narrative   Not on file  Social Determinants of Health   Financial Resource Strain: Not on file  Food Insecurity: Not on file  Transportation Needs: Not on file  Physical Activity: Not on file  Stress: Not on file  Social Connections: Not on file     Family History: The patient's family history includes Cancer in his mother; Hypertension in his father and mother; Other in his father.  ROS:   Please see the history of present illness.     All other systems reviewed and are negative.  EKGs/Labs/Other Studies Reviewed:    The following studies were reviewed today:  Echo 04/2020: 1. Left ventricular ejection fraction, by estimation, is 60 to 65%. The  left ventricle has normal function. The left ventricle has no regional  wall motion abnormalities. There is mild left ventricular hypertrophy.  Left ventricular diastolic parameters  are consistent with Grade I diastolic dysfunction (impaired relaxation).   2. Right ventricular systolic function is normal. The right ventricular  size is normal.   3. The mitral valve is normal in structure. Mild mitral valve  regurgitation. No evidence of mitral stenosis.   4. The aortic valve is tricuspid. Aortic valve regurgitation is trivial.  Mild aortic valve sclerosis is present, with no evidence of aortic valve   stenosis.   5. The inferior vena cava is normal in size with greater than 50%  respiratory variability, suggesting right atrial pressure of 3 mmHg.   EKG:  EKG is not ordered today.    Recent Labs: 04/21/2020: NT-Pro BNP 100 01/08/2021: B Natriuretic Peptide 61.4 01/10/2021: ALT 88; BUN 22; Creatinine, Ser 1.06; Hemoglobin 13.0; Magnesium 2.4; Platelets 259; Potassium 4.0; Sodium 132  Recent Lipid Panel No results found for: CHOL, TRIG, HDL, CHOLHDL, VLDL, LDLCALC, LDLDIRECT  Physical Exam:    VS:  BP 124/70 (BP Location: Left Arm, Cuff Size: Large)   Pulse 75   Ht 5\' 10"  (1.778 m)   Wt 203 lb 6.4 oz (92.3 kg)   SpO2 98%   BMI 29.18 kg/m     Wt Readings from Last 3 Encounters:  02/01/21 203 lb 6.4 oz (92.3 kg)  01/14/21 208 lb 6.4 oz (94.5 kg)  01/10/21 211 lb 6.7 oz (95.9 kg)     GEN:  Well nourished, well developed in no acute distress HEENT: Normal NECK: No JVD; No carotid bruits LYMPHATICS: No lymphadenopathy CARDIAC: RRR, no murmurs, rubs, gallops RESPIRATORY:  Clear to auscultation without rales, wheezing or rhonchi  ABDOMEN: Soft, non-tender, non-distended MUSCULOSKELETAL:  tr B LE edema- compression socks in place; No deformity  SKIN: Warm and dry NEUROLOGIC:  Alert and oriented x 3 PSYCHIATRIC:  Normal affect   ASSESSMENT:    1. Chronic diastolic CHF (congestive heart failure) ([place])   2. Coronary artery disease involving native coronary artery of native heart with angina pectoris (HCC)   3. Paroxysmal atrial fibrillation ([place])   4. Shortness of breath   5. Primary hypertension     PLAN:    In order of problems listed above:  1.Paroxysmal Atrial fibrillation/flutter - I have reviewed EKGs, there was atrial flutter in June and atrial fibrillation in July associated with infections.  - converted back to normal sinus rhythm -He is asymptomatic.  - on Eliquis for anticoagulation. Recommend he stop taking ASA. [place] vasc score of 5  2. Chronic diastolic  heart failure - volume status looks good on lasix 20 mg daily - echo earlier this year reassuring   3. Shortness of breath- chronic - echo 04/2020 with  preserved EF and mild DD, no significant valvular issues - maintained on 20 mg lasix - I suspect there is a major component of deconditioning. Encourage as much walking as he can tolerate. OK for PT.  - doubt ischemic   4. Hypertension - continue 20 mg lisinopril -Start low-dose metoprolol as above - controlled today   Follow-up with in 3 months   Medication Adjustments/Labs and Tests Ordered: Current medicines are reviewed at length with the patient today.  Concerns regarding medicines are outlined above.  No orders of the defined types were placed in this encounter.  No orders of the defined types were placed in this encounter.   Signed, Emmani Lesueur Martin[place], MD  02/01/2021 3:39 PM    [place]

## 2021-02-01 ENCOUNTER — Other Ambulatory Visit: Payer: Self-pay

## 2021-02-01 ENCOUNTER — Encounter: Payer: Self-pay | Admitting: Cardiology

## 2021-02-01 ENCOUNTER — Ambulatory Visit: Payer: Medicare Other | Admitting: Cardiology

## 2021-02-01 VITALS — BP 124/70 | HR 75 | Ht 70.0 in | Wt 203.4 lb

## 2021-02-01 DIAGNOSIS — I10 Essential (primary) hypertension: Secondary | ICD-10-CM

## 2021-02-01 DIAGNOSIS — I5032 Chronic diastolic (congestive) heart failure: Secondary | ICD-10-CM

## 2021-02-01 DIAGNOSIS — I480 Paroxysmal atrial fibrillation: Secondary | ICD-10-CM

## 2021-02-01 DIAGNOSIS — R0602 Shortness of breath: Secondary | ICD-10-CM | POA: Diagnosis not present

## 2021-02-01 DIAGNOSIS — I25119 Atherosclerotic heart disease of native coronary artery with unspecified angina pectoris: Secondary | ICD-10-CM

## 2021-02-01 NOTE — Patient Instructions (Signed)
Stop taking ASA  You make resume PT and do as much physical activity you can tolerate.

## 2021-03-04 ENCOUNTER — Encounter (HOSPITAL_BASED_OUTPATIENT_CLINIC_OR_DEPARTMENT_OTHER): Payer: Self-pay | Admitting: Emergency Medicine

## 2021-03-04 ENCOUNTER — Emergency Department (HOSPITAL_BASED_OUTPATIENT_CLINIC_OR_DEPARTMENT_OTHER)
Admission: EM | Admit: 2021-03-04 | Discharge: 2021-03-04 | Disposition: A | Discharge status: Home / Self Care | Payer: Medicare Other | Attending: Emergency Medicine | Admitting: Emergency Medicine

## 2021-03-04 ENCOUNTER — Emergency Department (HOSPITAL_BASED_OUTPATIENT_CLINIC_OR_DEPARTMENT_OTHER): Payer: Medicare Other

## 2021-03-04 ENCOUNTER — Other Ambulatory Visit: Payer: Self-pay

## 2021-03-04 DIAGNOSIS — I2510 Atherosclerotic heart disease of native coronary artery without angina pectoris: Secondary | ICD-10-CM | POA: Insufficient documentation

## 2021-03-04 DIAGNOSIS — Z20822 Contact with and (suspected) exposure to covid-19: Secondary | ICD-10-CM | POA: Insufficient documentation

## 2021-03-04 DIAGNOSIS — F0390 Unspecified dementia without behavioral disturbance: Secondary | ICD-10-CM | POA: Diagnosis not present

## 2021-03-04 DIAGNOSIS — R220 Localized swelling, mass and lump, head: Secondary | ICD-10-CM | POA: Diagnosis present

## 2021-03-04 DIAGNOSIS — Z79899 Other long term (current) drug therapy: Secondary | ICD-10-CM | POA: Insufficient documentation

## 2021-03-04 DIAGNOSIS — K1120 Sialoadenitis, unspecified: Secondary | ICD-10-CM | POA: Insufficient documentation

## 2021-03-04 DIAGNOSIS — R300 Dysuria: Secondary | ICD-10-CM | POA: Insufficient documentation

## 2021-03-04 LAB — COMPREHENSIVE METABOLIC PANEL
ALT: 16 U/L (ref 0–44)
AST: 14 U/L — ABNORMAL LOW (ref 15–41)
Albumin: 4.4 g/dL (ref 3.5–5.0)
Alkaline Phosphatase: 63 U/L (ref 38–126)
Anion gap: 7 (ref 5–15)
BUN: 23 mg/dL (ref 8–23)
CO2: 30 mmol/L (ref 22–32)
Calcium: 10.5 mg/dL — ABNORMAL HIGH (ref 8.9–10.3)
Chloride: 100 mmol/L (ref 98–111)
Creatinine, Ser: 1.24 mg/dL (ref 0.61–1.24)
GFR, Estimated: 58 mL/min — ABNORMAL LOW (ref >60)
Glucose, Bld: 144 mg/dL — ABNORMAL HIGH (ref 70–99)
Potassium: 3.8 mmol/L (ref 3.5–5.1)
Sodium: 137 mmol/L (ref 135–145)
Total Bilirubin: 0.7 mg/dL (ref 0.3–1.2)
Total Protein: 7.8 g/dL (ref 6.5–8.1)

## 2021-03-04 LAB — CBC WITH DIFFERENTIAL/PLATELET
Abs Immature Granulocytes: 0.04 10*3/uL (ref 0.00–0.07)
Basophils Absolute: 0.1 10*3/uL (ref 0.0–0.1)
Basophils Relative: 1 %
Eosinophils Absolute: 0.1 10*3/uL (ref 0.0–0.5)
Eosinophils Relative: 2 %
HCT: 40.7 % (ref 39.0–52.0)
Hemoglobin: 13.4 g/dL (ref 13.0–17.0)
Immature Granulocytes: 1 %
Lymphocytes Relative: 7 %
Lymphs Abs: 0.5 10*3/uL — ABNORMAL LOW (ref 0.7–4.0)
MCH: 30.4 pg (ref 26.0–34.0)
MCHC: 32.9 g/dL (ref 30.0–36.0)
MCV: 92.3 fL (ref 80.0–100.0)
Monocytes Absolute: 0.4 10*3/uL (ref 0.1–1.0)
Monocytes Relative: 5 %
Neutro Abs: 5.4 10*3/uL (ref 1.7–7.7)
Neutrophils Relative %: 84 %
Platelets: 286 10*3/uL (ref 150–400)
RBC: 4.41 MIL/uL (ref 4.22–5.81)
RDW: 13.1 % (ref 11.5–15.5)
WBC: 6.4 10*3/uL (ref 4.0–10.5)
nRBC: 0 % (ref 0.0–0.2)

## 2021-03-04 LAB — RESP PANEL BY RT-PCR (FLU A&B, COVID) ARPGX2
Influenza A by PCR: NEGATIVE
Influenza B by PCR: NEGATIVE
SARS Coronavirus 2 by RT PCR: NEGATIVE

## 2021-03-04 LAB — LACTIC ACID, PLASMA: Lactic Acid, Venous: 1.6 mmol/L (ref 0.5–1.9)

## 2021-03-04 LAB — PROTIME-INR
INR: 1.2 (ref 0.8–1.2)
Prothrombin Time: 15.2 seconds (ref 11.4–15.2)

## 2021-03-04 MED ORDER — CLINDAMYCIN HCL 300 MG PO CAPS: 300.0000 mg | ORAL_CAPSULE | Freq: Three times a day (TID) | ORAL | 0 refills | Status: AC

## 2021-03-04 MED ORDER — CLINDAMYCIN HCL 300 MG PO CAPS: 300.0000 mg | ORAL_CAPSULE | Freq: Three times a day (TID) | ORAL | 0 refills | Status: DC

## 2021-03-04 MED ORDER — IOHEXOL 300 MG/ML  SOLN
75.0000 mL | Freq: Once | INTRAMUSCULAR | Status: AC | PRN
  Administered 2021-03-04: 75 mL via INTRAVENOUS

## 2021-03-04 MED ORDER — CLINDAMYCIN HCL 150 MG PO CAPS
300.0000 mg | ORAL_CAPSULE | Freq: Once | ORAL | Status: AC
Start: 2021-03-04 — End: 2021-03-04
  Administered 2021-03-04: 300 mg via ORAL
  Filled 2021-03-04: qty 2

## 2021-03-04 NOTE — Discharge Instructions (Addendum)
Take the antibiotic as directed to clindamycin 3 times a day for 10 days.  Follow-up with Dr. Wilburn Cornelia from ear nose and throat.  Return for any new or worse symptoms.  Today no signs of serious infection hopefully that will not develop in the future but no guarantees hopefully the antibiotic will get things under control.

## 2021-03-04 NOTE — ED Provider Triage Note (Signed)
Emergency Medicine Provider Triage Evaluation Note  [name] [name] , a 81 y.o. male  was evaluated in triage.  Pt complains of neck pain.  Review of Systems  Positive: L parotid pain Negative: Fever, cough, dental pain, sore throat  Physical Exam  BP 127/63 (BP Location: Right Arm)    Pulse 69    Temp 98.1 F (36.7 C)    Resp 18    Ht 5\' 10"  (1.778 m)    Wt 90.7 kg    SpO2 97%    BMI 28.70 kg/m  Gen:   Awake, no distress   Resp:  Normal effort  MSK:   Moves extremities without difficulty  Other:    Medical Decision Making  Medically screening exam initiated at 3:41 PM.  Appropriate orders placed.  [name] was informed that the remainder of the evaluation will be completed by another provider, this initial triage assessment does not replace that evaluation, and the importance of remaining in the ED until their evaluation is complete.  Pt with hx of parotitis in the past here with pain and swelling to L parotid region since last night   Domenic Moras, PA-C 03/04/21 1542

## 2021-03-04 NOTE — ED Triage Notes (Addendum)
Left facial swelling since  last night , per daughter  he has  had this since  May and has had 2 infections that side and ended up in hospital after same last time denies difficulty swallowing or breathing ,  handling secretions  well and no slurred speech

## 2021-03-04 NOTE — ED Provider Notes (Signed)
[facility] EMERGENCY DEPT Provider Note   CSN: [id number] Arrival date & time: 03/04/21  1455     History  Chief Complaint  Patient presents with   Facial Pain    [name] [name] is a 81 y.o. male.  Patient started developing some left facial swelling right in front of his left ear last night.  Family is concerned that this is a development that led to sepsis with admission on October 10.  Patient started on broad-spectrum antibiotics for sepsis.  Had some concern that there may have been an infection in the parotid or salivary gland in that area.  Patient was transitioned to Unasyn and then discharged home on Augmentin.  But according to sister it started to get worse again they contacted Dr. Wilburn Cornelia who they had follow-up with as an outpatient and was switched to clindamycin which resolved it.  At the time that Dr. Wilburn Cornelia saw him on November 20 everything seemed to be normal.  Patient here today without any fevers not tachycardic not hypotensive not tachypneic.  Oxygen saturations in the upper 90s.    Past medical history is significant for dementia coronary artery disease dysuria.  Patient was a former smoker quit in 1992.      Home Medications Prior to Admission medications   Medication Sig Start Date End Date Taking? Authorizing Provider  albuterol (VENTOLIN HFA) 108 (90 Base) MCG/ACT inhaler Inhale 1 puff into the lungs every 4 (four) hours as needed for wheezing or shortness of breath.    [provider]  apixaban (ELIQUIS) 5 MG TABS tablet Take 1 tablet (5 mg total) by mouth 2 (two) times daily. 01/14/21   [facility], Tami Lin, PA  calcium-vitamin D (OSCAL WITH D) 500-200 MG-UNIT per tablet Take 1 tablet by mouth 2 (two) times daily.    [provider]  docusate sodium (COLACE) 100 MG capsule Take 200 mg by mouth 2 (two) times daily.    [provider]  donepezil (ARICEPT) 10 MG tablet Take 10 mg by mouth at bedtime. 12/01/16    [provider]  fesoterodine (TOVIAZ) 8 MG TB24 tablet Take 8 mg by mouth every morning. 12/13/18   [provider]  fluticasone (FLONASE) 50 MCG/ACT nasal spray Place 2 sprays into both nostrils daily as needed for allergies. 05/15/17   [provider]  furosemide (LASIX) 20 MG tablet Take 1 tablet (20 mg total) by mouth daily. 09/07/20   Sande Rives E, PA-C  lisinopril (ZESTRIL) 20 MG tablet Take 20 mg by mouth every morning. 08/23/18   [provider]  metoprolol tartrate (LOPRESSOR) 25 MG tablet Take 1 tablet (25 mg total) by mouth 2 (two) times daily. 01/14/21 04/14/21  Ledora Bottcher, PA  mirabegron ER (MYRBETRIQ) 50 MG TB24 tablet Take 50 mg by mouth every morning.    [provider]  Misc Natural Products (OSTEO BI-FLEX/5-LOXIN ADVANCED) TABS Take 1 tablet by mouth 2 (two) times daily.    [provider]  Multiple Vitamin (MULTIVITAMIN WITH MINERALS) TABS tablet Take 1 tablet by mouth every morning.    [provider]  nitrofurantoin, macrocrystal-monohydrate, (MACROBID) 100 MG capsule Take 1 capsule (100 mg total) by mouth in the morning. 01/23/21   Mercy Riding, MD  Omega-3 Fatty Acids (FISH OIL) 1200 MG CAPS Take 1,200 mg by mouth 2 (two) times daily.    [provider]  PRESCRIPTION MEDICATION Inhale into the lungs at bedtime. CPAP    [provider]  Psyllium (METAMUCIL PO) Take 3.4 g by mouth See admin instructions. Mix 3.4 grams of powder into 8 ounces of water or juice and drink once a day    [provider]  rosuvastatin (CRESTOR) 10 MG tablet TAKE 1/2 TABLET BY MOUTH EVERY OTHER DAY Patient taking differently: Take 5 mg by mouth every other day. 06/22/20   Martinique, Peter M, MD      Allergies    Enablex [darifenacin hydrobromide er], Ceftriaxone, Atorvastatin, Ezetimibe, Levaquin [levofloxacin], Oxybutynin, Sulfamethoxazole, Sulfasalazine, and Sulfonamide derivatives    Review of  Systems   Review of Systems  Constitutional:  Negative for chills and fever.  HENT:  Positive for ear discharge, ear pain and facial swelling. Negative for sore throat and trouble swallowing.   Eyes:  Negative for pain and visual disturbance.  Respiratory:  Negative for cough and shortness of breath.   Cardiovascular:  Negative for chest pain and palpitations.  Gastrointestinal:  Negative for abdominal pain and vomiting.  Genitourinary:  Negative for dysuria and hematuria.  Musculoskeletal:  Negative for arthralgias and back pain.  Skin:  Negative for color change and rash.  Neurological:  Negative for seizures and syncope.  All other systems reviewed and are negative.  Physical Exam Updated Vital Signs BP (!) 161/78 (BP Location: Right Arm)    Pulse 62    Temp 98.1 F (36.7 C)    Resp 16    Ht 1.778 m (5\' 10" )    Wt 90.7 kg    SpO2 100%    BMI 28.70 kg/m  Physical Exam Vitals and nursing note reviewed.  Constitutional:      General: He is not in acute distress.    Appearance: Normal appearance. He is well-developed. He is not ill-appearing or toxic-appearing.  HENT:     Head: Normocephalic.     Comments: Swelling with some induration in front of the left ear.  Measuring probably 3 x 4 cm.  Nontender to palpation.  May be some slight erythema.  No fluctuance.  Tenderness to palpation to the left mastoid area.    Right Ear: Tympanic membrane, ear canal and external ear normal.     Left Ear: Ear canal and external ear normal.     Ears:     Comments: Some slight bulging and slight erythema to the left TM.    Mouth/Throat:     Mouth: Mucous membranes are moist.     Pharynx: Oropharynx is clear.  Eyes:     Conjunctiva/sclera: Conjunctivae normal.     Pupils: Pupils are equal, round, and reactive to light.  Neck:     Comments: No cervical adenopathy Cardiovascular:     Rate and Rhythm: Normal rate and regular rhythm.     Heart sounds: No murmur heard. Pulmonary:     Effort:  Pulmonary effort is normal. No respiratory distress.     Breath sounds: Normal breath sounds.  Abdominal:     Palpations: Abdomen is soft.     Tenderness: There is no abdominal tenderness.  Musculoskeletal:        General: No swelling.     Cervical back: Normal range of motion and neck supple. No rigidity.  Lymphadenopathy:     Cervical: No cervical adenopathy.  Skin:    General: Skin is warm and dry.     Capillary Refill: Capillary refill takes less than 2 seconds.  Neurological:     General: No focal deficit present.     Mental Status: He is alert. Mental  status is at baseline.  Psychiatric:        Mood and Affect: Mood normal.    ED Results / Procedures / Treatments   Labs (all labs ordered are listed, but only abnormal results are displayed) Labs Reviewed  COMPREHENSIVE METABOLIC PANEL - Abnormal; Notable for the following components:      Result Value   Glucose, Bld 144 (*)    Calcium 10.5 (*)    AST 14 (*)    GFR, Estimated 58 (*)    All other components within normal limits  CBC WITH DIFFERENTIAL/PLATELET - Abnormal; Notable for the following components:   Lymphs Abs 0.5 (*)    All other components within normal limits  RESP PANEL BY RT-PCR (FLU A&B, COVID) ARPGX2  CULTURE, BLOOD (ROUTINE X 2)  CULTURE, BLOOD (ROUTINE X 2)  LACTIC ACID, PLASMA  PROTIME-INR  LACTIC ACID, PLASMA    EKG None  Radiology CT Soft Tissue Neck W Contrast  Result Date: 03/04/2021 CLINICAL DATA:  Parotid region mass, left facial swelling EXAM: CT NECK WITH CONTRAST TECHNIQUE: Multidetector CT imaging of the neck was performed using the standard protocol following the bolus administration of intravenous contrast. CONTRAST:  13mL OMNIPAQUE IOHEXOL 300 MG/ML  SOLN COMPARISON:  01/08/2021 FINDINGS: Pharynx and larynx: Normal. No mass or swelling. Salivary glands: Previously noted stranding about the submandibular glands is no longer seen. Redemonstrated chronically small left submandibular  gland. The bilateral parotid glands are unremarkable. No hyperenhancement, mass, or stone. Thyroid: Redemonstrated 17 mm coarse calcification in the right thyroid lobe. Edema in the region of the thyroid has also resolved. Lymph nodes: None enlarged or abnormal density. Vascular: Negative. Limited intracranial: Negative. Visualized orbits: Negative. Mastoids and visualized paranasal sinuses: Clear. Skeleton: No acute or aggressive process. Upper chest: Negative. Other: Resolution of previously noted retropharyngeal edema. Soft tissues are unremarkable. IMPRESSION: No acute process in the neck. Resolution of previously noted retropharyngeal edema and stranding throughout the neck. No correlate is seen for the reported left parotid region mass or facial swelling. Electronically Signed   By: Merilyn Baba M.D.   On: 03/04/2021 17:43    Procedures Procedures     Medications Ordered in ED Medications  clindamycin (CLEOCIN) capsule 300 mg (has no administration in time range)  iohexol (OMNIPAQUE) 300 MG/ML solution 75 mL (75 mLs Intravenous Contrast Given 03/04/21 1711)    ED Course/ Medical Decision Making/ A&P                           Medical Decision Making  Some swelling in front of the left ear.  Suggestive of some parotid swelling.  Not tender to palpation.  No difficulty following.  Oropharynx is clear.  Left TM looks a little erythematous and a little bit bulgy.  Patient denies any ear pain.  No pinna pain.  No cervical adenopathy no mastoid tenderness.  Labs very reassuring complete metabolic panel without any acute findings.  No leukocytosis.  White count 6.4 hemoglobin 13.4.  INR normal at 1.2.  COVID and flu testing negative.  Blood cultures have been sent and are pending.  CT soft tissue neck and face showed no acute process in the neck resolution of previous noted retropharyngeal edema and stranding throughout the neck no correlation is seen for the reported left parotid region mass or  facial swelling.  Based on the patient's daughter she would prefer him to be treated with clindamycin and not do a trial of  Augmentin again because she stated that did not work.  So we will start him on clindamycin first dose given here tonight.  And then he can pick up a prescription tomorrow and continue the clindamycin for 10 days.  So recommend that they call Dr. Wilburn Cornelia ear nose and throat for follow-up patient's son thinks he may have follow-up on Thursday.  Obviously he will need to return for any new or worse symptoms.  No indication for admission at this time no signs of any significant infection at this time.  However things could change in the future hopefully the antibiotics will get things under control.  No signs of sepsis today. Final Clinical Impression(s) / ED Diagnoses Final diagnoses:  Parotitis    Rx / DC Orders ED Discharge Orders     None         Fredia Sorrow, MD 03/04/21 2115

## 2021-03-09 LAB — CULTURE, BLOOD (ROUTINE X 2)
Culture: NO GROWTH
Special Requests: ADEQUATE

## 2021-03-19 ENCOUNTER — Telehealth: Payer: Self-pay | Admitting: Internal Medicine

## 2021-03-19 NOTE — Telephone Encounter (Signed)
Called and spoke with patient. He stated that he is in need of cpap supplies. He was established with [facility] in [place] but he wishes to be established with a local DME so that he will not have to drive to [place]. He is aware that the PCCs will find a company that accepts his insurance.   Dr. Annamaria Boots, please advise if you are ok with placing an order for him to establish care with a local DME. Thanks!

## 2021-03-19 NOTE — Telephone Encounter (Signed)
Yes please- CPAP auto 5-15, mask of choice, humidifier, supplies, AirView/ card.

## 2021-03-19 NOTE — Telephone Encounter (Signed)
Called patient but he did not answer. Left message for patient to call back.  °

## 2021-04-02 ENCOUNTER — Inpatient Hospital Stay (HOSPITAL_COMMUNITY): Payer: Medicare Other

## 2021-04-02 ENCOUNTER — Emergency Department (HOSPITAL_COMMUNITY): Payer: Medicare Other

## 2021-04-02 ENCOUNTER — Inpatient Hospital Stay (HOSPITAL_COMMUNITY)
Admission: EM | Admit: 2021-04-02 | Discharge: 2021-04-06 | DRG: 178 | Disposition: A | Discharge status: Home Health | Payer: Medicare Other | Attending: Internal Medicine | Admitting: Internal Medicine

## 2021-04-02 ENCOUNTER — Encounter (HOSPITAL_COMMUNITY): Payer: Self-pay

## 2021-04-02 ENCOUNTER — Other Ambulatory Visit: Payer: Self-pay

## 2021-04-02 DIAGNOSIS — R0902 Hypoxemia: Secondary | ICD-10-CM | POA: Diagnosis present

## 2021-04-02 DIAGNOSIS — N1831 Chronic kidney disease, stage 3a: Secondary | ICD-10-CM | POA: Diagnosis present

## 2021-04-02 DIAGNOSIS — G309 Alzheimer's disease, unspecified: Secondary | ICD-10-CM | POA: Diagnosis present

## 2021-04-02 DIAGNOSIS — I5032 Chronic diastolic (congestive) heart failure: Secondary | ICD-10-CM | POA: Diagnosis present

## 2021-04-02 DIAGNOSIS — R0603 Acute respiratory distress: Secondary | ICD-10-CM | POA: Diagnosis not present

## 2021-04-02 DIAGNOSIS — I10 Essential (primary) hypertension: Secondary | ICD-10-CM | POA: Diagnosis present

## 2021-04-02 DIAGNOSIS — E872 Acidosis, unspecified: Secondary | ICD-10-CM | POA: Diagnosis present

## 2021-04-02 DIAGNOSIS — I130 Hypertensive heart and chronic kidney disease with heart failure and stage 1 through stage 4 chronic kidney disease, or unspecified chronic kidney disease: Secondary | ICD-10-CM | POA: Diagnosis present

## 2021-04-02 DIAGNOSIS — E785 Hyperlipidemia, unspecified: Secondary | ICD-10-CM | POA: Diagnosis present

## 2021-04-02 DIAGNOSIS — Z6830 Body mass index (BMI) 30.0-30.9, adult: Secondary | ICD-10-CM

## 2021-04-02 DIAGNOSIS — Z7901 Long term (current) use of anticoagulants: Secondary | ICD-10-CM | POA: Diagnosis not present

## 2021-04-02 DIAGNOSIS — K6289 Other specified diseases of anus and rectum: Secondary | ICD-10-CM | POA: Diagnosis present

## 2021-04-02 DIAGNOSIS — R6510 Systemic inflammatory response syndrome (SIRS) of non-infectious origin without acute organ dysfunction: Secondary | ICD-10-CM | POA: Diagnosis present

## 2021-04-02 DIAGNOSIS — R4182 Altered mental status, unspecified: Secondary | ICD-10-CM | POA: Diagnosis not present

## 2021-04-02 DIAGNOSIS — F02818 Dementia in other diseases classified elsewhere, unspecified severity, with other behavioral disturbance: Secondary | ICD-10-CM | POA: Diagnosis present

## 2021-04-02 DIAGNOSIS — Z87891 Personal history of nicotine dependence: Secondary | ICD-10-CM | POA: Diagnosis not present

## 2021-04-02 DIAGNOSIS — R410 Disorientation, unspecified: Secondary | ICD-10-CM

## 2021-04-02 DIAGNOSIS — Z888 Allergy status to other drugs, medicaments and biological substances status: Secondary | ICD-10-CM

## 2021-04-02 DIAGNOSIS — F03918 Unspecified dementia, unspecified severity, with other behavioral disturbance: Secondary | ICD-10-CM | POA: Diagnosis not present

## 2021-04-02 DIAGNOSIS — F0280 Dementia in other diseases classified elsewhere without behavioral disturbance: Secondary | ICD-10-CM | POA: Diagnosis not present

## 2021-04-02 DIAGNOSIS — Z809 Family history of malignant neoplasm, unspecified: Secondary | ICD-10-CM

## 2021-04-02 DIAGNOSIS — G308 Other Alzheimer's disease: Secondary | ICD-10-CM | POA: Diagnosis not present

## 2021-04-02 DIAGNOSIS — R8271 Bacteriuria: Secondary | ICD-10-CM | POA: Diagnosis not present

## 2021-04-02 DIAGNOSIS — Z882 Allergy status to sulfonamides status: Secondary | ICD-10-CM

## 2021-04-02 DIAGNOSIS — I447 Left bundle-branch block, unspecified: Secondary | ICD-10-CM | POA: Diagnosis present

## 2021-04-02 DIAGNOSIS — I480 Paroxysmal atrial fibrillation: Secondary | ICD-10-CM | POA: Diagnosis present

## 2021-04-02 DIAGNOSIS — I2510 Atherosclerotic heart disease of native coronary artery without angina pectoris: Secondary | ICD-10-CM | POA: Diagnosis present

## 2021-04-02 DIAGNOSIS — I25119 Atherosclerotic heart disease of native coronary artery with unspecified angina pectoris: Secondary | ICD-10-CM | POA: Diagnosis not present

## 2021-04-02 DIAGNOSIS — G4733 Obstructive sleep apnea (adult) (pediatric): Secondary | ICD-10-CM

## 2021-04-02 DIAGNOSIS — Z9989 Dependence on other enabling machines and devices: Secondary | ICD-10-CM

## 2021-04-02 DIAGNOSIS — I728 Aneurysm of other specified arteries: Secondary | ICD-10-CM | POA: Diagnosis present

## 2021-04-02 DIAGNOSIS — R599 Enlarged lymph nodes, unspecified: Secondary | ICD-10-CM | POA: Diagnosis present

## 2021-04-02 DIAGNOSIS — Z96642 Presence of left artificial hip joint: Secondary | ICD-10-CM | POA: Diagnosis present

## 2021-04-02 DIAGNOSIS — G9349 Other encephalopathy: Secondary | ICD-10-CM | POA: Diagnosis present

## 2021-04-02 DIAGNOSIS — Z8249 Family history of ischemic heart disease and other diseases of the circulatory system: Secondary | ICD-10-CM | POA: Diagnosis not present

## 2021-04-02 DIAGNOSIS — U071 COVID-19: Principal | ICD-10-CM | POA: Diagnosis present

## 2021-04-02 DIAGNOSIS — Z881 Allergy status to other antibiotic agents status: Secondary | ICD-10-CM

## 2021-04-02 DIAGNOSIS — Z79899 Other long term (current) drug therapy: Secondary | ICD-10-CM

## 2021-04-02 DIAGNOSIS — E669 Obesity, unspecified: Secondary | ICD-10-CM | POA: Diagnosis present

## 2021-04-02 LAB — CBC WITH DIFFERENTIAL/PLATELET
Abs Immature Granulocytes: 0.02 10*3/uL (ref 0.00–0.07)
Basophils Absolute: 0 10*3/uL (ref 0.0–0.1)
Basophils Relative: 0 %
Eosinophils Absolute: 0.1 10*3/uL (ref 0.0–0.5)
Eosinophils Relative: 1 %
HCT: 39.5 % (ref 39.0–52.0)
Hemoglobin: 13 g/dL (ref 13.0–17.0)
Immature Granulocytes: 0 %
Lymphocytes Relative: 7 %
Lymphs Abs: 0.5 10*3/uL — ABNORMAL LOW (ref 0.7–4.0)
MCH: 31 pg (ref 26.0–34.0)
MCHC: 32.9 g/dL (ref 30.0–36.0)
MCV: 94 fL (ref 80.0–100.0)
Monocytes Absolute: 0.6 10*3/uL (ref 0.1–1.0)
Monocytes Relative: 9 %
Neutro Abs: 5.5 10*3/uL (ref 1.7–7.7)
Neutrophils Relative %: 83 %
Platelets: 220 10*3/uL (ref 150–400)
RBC: 4.2 MIL/uL — ABNORMAL LOW (ref 4.22–5.81)
RDW: 13.1 % (ref 11.5–15.5)
WBC: 6.6 10*3/uL (ref 4.0–10.5)
nRBC: 0 % (ref 0.0–0.2)

## 2021-04-02 LAB — COMPREHENSIVE METABOLIC PANEL
ALT: 36 U/L (ref 0–44)
AST: 29 U/L (ref 15–41)
Albumin: 3.7 g/dL (ref 3.5–5.0)
Alkaline Phosphatase: 68 U/L (ref 38–126)
Anion gap: 10 (ref 5–15)
BUN: 18 mg/dL (ref 8–23)
CO2: 22 mmol/L (ref 22–32)
Calcium: 10.1 mg/dL (ref 8.9–10.3)
Chloride: 107 mmol/L (ref 98–111)
Creatinine, Ser: 1.25 mg/dL — ABNORMAL HIGH (ref 0.61–1.24)
GFR, Estimated: 57 mL/min — ABNORMAL LOW (ref >60)
Glucose, Bld: 164 mg/dL — ABNORMAL HIGH (ref 70–99)
Potassium: 4 mmol/L (ref 3.5–5.1)
Sodium: 139 mmol/L (ref 135–145)
Total Bilirubin: 1 mg/dL (ref 0.3–1.2)
Total Protein: 6.8 g/dL (ref 6.5–8.1)

## 2021-04-02 LAB — URINALYSIS, ROUTINE W REFLEX MICROSCOPIC
Bilirubin Urine: NEGATIVE
Glucose, UA: NEGATIVE mg/dL
Hgb urine dipstick: NEGATIVE
Ketones, ur: NEGATIVE mg/dL
Nitrite: NEGATIVE
Protein, ur: NEGATIVE mg/dL
Specific Gravity, Urine: 1.025 (ref 1.005–1.030)
pH: 5.5 (ref 5.0–8.0)

## 2021-04-02 LAB — APTT: aPTT: 30 seconds (ref 24–36)

## 2021-04-02 LAB — BRAIN NATRIURETIC PEPTIDE: B Natriuretic Peptide: 437.5 pg/mL — ABNORMAL HIGH (ref 0.0–100.0)

## 2021-04-02 LAB — URINALYSIS, MICROSCOPIC (REFLEX)

## 2021-04-02 LAB — LACTIC ACID, PLASMA
Lactic Acid, Venous: 2.3 mmol/L (ref 0.5–1.9)
Lactic Acid, Venous: 2.4 mmol/L (ref 0.5–1.9)
Lactic Acid, Venous: 2.1 mmol/L (ref 0.5–1.9)
Lactic Acid, Venous: 2.5 mmol/L (ref 0.5–1.9)

## 2021-04-02 LAB — TYPE AND SCREEN
ABO/RH(D): O POS
Antibody Screen: NEGATIVE

## 2021-04-02 LAB — PHOSPHORUS: Phosphorus: 3.5 mg/dL (ref 2.5–4.6)

## 2021-04-02 LAB — D-DIMER, QUANTITATIVE: D-Dimer, Quant: 1.44 ug/mL-FEU — ABNORMAL HIGH (ref 0.00–0.50)

## 2021-04-02 LAB — PROCALCITONIN: Procalcitonin: <0.1 ng/mL

## 2021-04-02 LAB — C-REACTIVE PROTEIN: CRP: 1.9 mg/dL — ABNORMAL HIGH (ref <1.0)

## 2021-04-02 LAB — RESP PANEL BY RT-PCR (FLU A&B, COVID) ARPGX2
Influenza A by PCR: NEGATIVE
Influenza B by PCR: NEGATIVE
SARS Coronavirus 2 by RT PCR: POSITIVE — AB

## 2021-04-02 LAB — HEPATITIS B SURFACE ANTIGEN: Hepatitis B Surface Ag: NONREACTIVE

## 2021-04-02 LAB — FERRITIN: Ferritin: 124 ng/mL (ref 24–336)

## 2021-04-02 LAB — PROTIME-INR
INR: 1.1 (ref 0.8–1.2)
Prothrombin Time: 14.4 seconds (ref 11.4–15.2)

## 2021-04-02 LAB — LACTATE DEHYDROGENASE: LDH: 188 U/L (ref 98–192)

## 2021-04-02 LAB — MAGNESIUM: Magnesium: 1.8 mg/dL (ref 1.7–2.4)

## 2021-04-02 LAB — FIBRINOGEN: Fibrinogen: 471 mg/dL (ref 210–475)

## 2021-04-02 LAB — ABO/RH: ABO/RH(D): O POS

## 2021-04-02 MED ORDER — ACETAMINOPHEN 650 MG RE SUPP: 650.0000 mg | Freq: Four times a day (QID) | RECTAL | Status: DC | PRN

## 2021-04-02 MED ORDER — LISINOPRIL 20 MG PO TABS
20.0000 mg | ORAL_TABLET | Freq: Every day | ORAL | Status: DC
  Administered 2021-04-02 – 2021-04-05 (×4): 20 mg via ORAL
  Filled 2021-04-02 (×4): qty 1

## 2021-04-02 MED ORDER — LACTATED RINGERS IV BOLUS (SEPSIS)
1000.0000 mL | Freq: Once | INTRAVENOUS | Status: AC
  Administered 2021-04-02: 1000 mL via INTRAVENOUS

## 2021-04-02 MED ORDER — SODIUM CHLORIDE 0.9 % IV SOLN
100.0000 mg | Freq: Every day | INTRAVENOUS | Status: DC
  Administered 2021-04-03 – 2021-04-05 (×3): 100 mg via INTRAVENOUS
  Filled 2021-04-02 (×3): qty 20

## 2021-04-02 MED ORDER — FUROSEMIDE 10 MG/ML IJ SOLN
40.0000 mg | Freq: Once | INTRAMUSCULAR | Status: AC
  Administered 2021-04-02: 40 mg via INTRAVENOUS
  Filled 2021-04-02: qty 4

## 2021-04-02 MED ORDER — FESOTERODINE FUMARATE ER 8 MG PO TB24
8.0000 mg | ORAL_TABLET | Freq: Every day | ORAL | Status: DC
  Administered 2021-04-02 – 2021-04-05 (×4): 8 mg via ORAL
  Filled 2021-04-02 (×8): qty 1

## 2021-04-02 MED ORDER — IOHEXOL 300 MG/ML  SOLN
75.0000 mL | Freq: Once | INTRAMUSCULAR | Status: AC | PRN
  Administered 2021-04-02: 75 mL via INTRAVENOUS

## 2021-04-02 MED ORDER — ROSUVASTATIN CALCIUM 5 MG PO TABS
5.0000 mg | ORAL_TABLET | ORAL | Status: DC
  Administered 2021-04-02 – 2021-04-06 (×3): 5 mg via ORAL
  Filled 2021-04-02 (×4): qty 1

## 2021-04-02 MED ORDER — APIXABAN 5 MG PO TABS
5.0000 mg | ORAL_TABLET | Freq: Two times a day (BID) | ORAL | Status: DC
  Administered 2021-04-02 – 2021-04-06 (×9): 5 mg via ORAL
  Filled 2021-04-02 (×9): qty 1

## 2021-04-02 MED ORDER — GUAIFENESIN-DM 100-10 MG/5ML PO SYRP: 10.0000 mL | ORAL_SOLUTION | ORAL | Status: DC | PRN

## 2021-04-02 MED ORDER — FLUTICASONE PROPIONATE 50 MCG/ACT NA SUSP
2.0000 | Freq: Every day | NASAL | Status: DC | PRN
  Filled 2021-04-02: qty 16

## 2021-04-02 MED ORDER — ZINC SULFATE 220 (50 ZN) MG PO CAPS
220.0000 mg | ORAL_CAPSULE | Freq: Every day | ORAL | Status: DC
  Administered 2021-04-02 – 2021-04-06 (×5): 220 mg via ORAL
  Filled 2021-04-02 (×5): qty 1

## 2021-04-02 MED ORDER — ACETAMINOPHEN 325 MG PO TABS: 650.0000 mg | ORAL_TABLET | Freq: Four times a day (QID) | ORAL | Status: DC | PRN

## 2021-04-02 MED ORDER — LACTATED RINGERS IV SOLN: INTRAVENOUS | Status: DC

## 2021-04-02 MED ORDER — SODIUM CHLORIDE 0.9% FLUSH
3.0000 mL | Freq: Two times a day (BID) | INTRAVENOUS | Status: DC
  Administered 2021-04-02 – 2021-04-06 (×9): 3 mL via INTRAVENOUS

## 2021-04-02 MED ORDER — IOHEXOL 350 MG/ML SOLN
50.0000 mL | Freq: Once | INTRAVENOUS | Status: AC | PRN
  Administered 2021-04-02: 50 mL via INTRAVENOUS

## 2021-04-02 MED ORDER — METRONIDAZOLE 500 MG/100ML IV SOLN
500.0000 mg | Freq: Once | INTRAVENOUS | Status: DC
  Filled 2021-04-02: qty 100

## 2021-04-02 MED ORDER — HYDROCOD POLI-CHLORPHE POLI ER 10-8 MG/5ML PO SUER: 5.0000 mL | Freq: Two times a day (BID) | ORAL | Status: DC | PRN

## 2021-04-02 MED ORDER — SODIUM CHLORIDE 0.9 % IV SOLN
200.0000 mg | Freq: Once | INTRAVENOUS | Status: AC
  Administered 2021-04-02: 200 mg via INTRAVENOUS
  Filled 2021-04-02: qty 40

## 2021-04-02 MED ORDER — ONDANSETRON HCL 4 MG/2ML IJ SOLN: 4.0000 mg | Freq: Four times a day (QID) | INTRAMUSCULAR | Status: DC | PRN

## 2021-04-02 MED ORDER — MIRABEGRON ER 50 MG PO TB24
50.0000 mg | ORAL_TABLET | Freq: Every morning | ORAL | Status: DC
  Administered 2021-04-02 – 2021-04-06 (×5): 50 mg via ORAL
  Filled 2021-04-02 (×5): qty 1

## 2021-04-02 MED ORDER — VANCOMYCIN HCL IN DEXTROSE 1-5 GM/200ML-% IV SOLN: 1000.0000 mg | Freq: Once | INTRAVENOUS | Status: DC

## 2021-04-02 MED ORDER — CARVEDILOL 3.125 MG PO TABS
3.1250 mg | ORAL_TABLET | Freq: Two times a day (BID) | ORAL | Status: DC
  Administered 2021-04-02 – 2021-04-06 (×9): 3.125 mg via ORAL
  Filled 2021-04-02 (×9): qty 1

## 2021-04-02 MED ORDER — METOPROLOL TARTRATE 25 MG PO TABS: 25.0000 mg | ORAL_TABLET | Freq: Two times a day (BID) | ORAL | Status: DC

## 2021-04-02 MED ORDER — DONEPEZIL HCL 10 MG PO TABS
10.0000 mg | ORAL_TABLET | Freq: Every day | ORAL | Status: DC
  Administered 2021-04-02 – 2021-04-05 (×4): 10 mg via ORAL
  Filled 2021-04-02 (×4): qty 1

## 2021-04-02 MED ORDER — VANCOMYCIN HCL 2000 MG/400ML IV SOLN
2000.0000 mg | Freq: Once | INTRAVENOUS | Status: DC
Start: 2021-04-02 — End: 2021-04-02
  Filled 2021-04-02: qty 400

## 2021-04-02 MED ORDER — ALBUTEROL SULFATE HFA 108 (90 BASE) MCG/ACT IN AERS
2.0000 | INHALATION_SPRAY | Freq: Four times a day (QID) | RESPIRATORY_TRACT | Status: DC
  Administered 2021-04-02 – 2021-04-04 (×5): 2 via RESPIRATORY_TRACT
  Filled 2021-04-02: qty 6.7

## 2021-04-02 MED ORDER — ACETAMINOPHEN 325 MG PO TABS
650.0000 mg | ORAL_TABLET | Freq: Once | ORAL | Status: AC
  Administered 2021-04-02: 650 mg via ORAL
  Filled 2021-04-02: qty 2

## 2021-04-02 MED ORDER — ONDANSETRON HCL 4 MG PO TABS: 4.0000 mg | ORAL_TABLET | Freq: Four times a day (QID) | ORAL | Status: DC | PRN

## 2021-04-02 MED ORDER — PIPERACILLIN-TAZOBACTAM 3.375 G IVPB 30 MIN
3.3750 g | Freq: Once | INTRAVENOUS | Status: AC
  Administered 2021-04-02: 3.375 g via INTRAVENOUS
  Filled 2021-04-02: qty 50

## 2021-04-02 MED ORDER — ALBUTEROL SULFATE (2.5 MG/3ML) 0.083% IN NEBU: 2.5000 mg | INHALATION_SOLUTION | Freq: Four times a day (QID) | RESPIRATORY_TRACT | Status: DC | PRN

## 2021-04-02 MED ORDER — ASCORBIC ACID 500 MG PO TABS
500.0000 mg | ORAL_TABLET | Freq: Every day | ORAL | Status: DC
  Administered 2021-04-02 – 2021-04-06 (×5): 500 mg via ORAL
  Filled 2021-04-02 (×5): qty 1

## 2021-04-02 NOTE — ED Notes (Signed)
Attempted to call report via phone x1

## 2021-04-02 NOTE — ED Notes (Signed)
Pt returned from CT °

## 2021-04-02 NOTE — ED Notes (Signed)
RN was assessing pt neuro function. Pt was able to provide RN name and dob. RN asked pt where he was at or which hospital is he at. Pt responded, "just leave me alone."

## 2021-04-02 NOTE — Assessment & Plan Note (Addendum)
Maintaining sinus rhythm-on metoprolol and Eliquis.

## 2021-04-02 NOTE — ED Notes (Signed)
Pt is in bed resting.

## 2021-04-02 NOTE — ED Triage Notes (Addendum)
Pt bib [facility] from home for altered mental status. Daughter states pt went to get a shower at 2215 and daughter realized he was not responding to her normally and refusing to get out of shower. Pt with hx of alzheimer's but is normally A&ox4. Per ems, pt A&Ox1 and not following commands. 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   CBG 180, BP 150/60, HR 60's

## 2021-04-02 NOTE — ED Notes (Signed)
RN asked pt if he would like to eat his lunch. Pt stated not right now.

## 2021-04-02 NOTE — Assessment & Plan Note (Addendum)
Had minimal hypoxia on initial presentation-requiring 1-2 L-has been transitioned to room air.  No PNA/PE on CTA chest.  Continue supportive care.

## 2021-04-02 NOTE — H&P (Addendum)
History and Physical    Patient: [name] [name] NKN:[id number] DOB: 09-19-1940 DOA: 04/02/2021 DOS: the patient was seen and examined on 04/02/2021 PCP: Chesley Noon, MD  Patient coming from: Home (lives with daughter) via EMS  Chief Complaint:  Chief Complaint  Patient presents with   Altered Mental Status    HPI: [name] [name] is a 81 y.o. male with medical history significant of Alzheimer's dementia, CAD, PAF on Eliquis, hyperlipidemia, and parotitis who presents after being noted to be acutely altered last night by his daughter.  History is obtained from the patient's daughter over the phone as he is unwilling to cooperate with questioning at this time states that he does not know.  At baseline he is able to complete most of his ADLs without much assistance and is able to stay at the house while she is at work.  Yesterday, after getting back from a church meeting he had complained of having a cold and had gone to take a shower.  Normally she states that it would take an hour or so to take a shower due to his mobility, but after our half she went to go check on him.  The patient had told her to go away, but she went into check on him anyway and found him to standing holding onto the rails of the shower.  He had not even taken a shower at that time and therefore she called EMS.  She states that is not unusual for him to be agitated when he is sick.  Over the last year he had had several bouts with proctitis for which she was concerned patient had a reoccurrence of this.  She reports that he has had a productive cough.  Of note the patient has been without his Eliquis since 1/31  and had not been on furosemide 20 mg daily since 1/28 due to insurance issues.   With EMS patient was noted to only be alert and oriented x1, and not following commands.  Vital signs were noted to be stable but patient currently on 3 L nasal cannula oxygen.  He was noted to be febrile up to 103.1 F with tachypnea  meeting SIRS criteria.  Labs were significant for BUN 18, creatinine 1.25, glucose 164, lactic acid 2.3->2.4->2.1.  Urinalysis noted trace leukocytes with rare bacteria and 6-10 WBCs.  Chest x-ray noted mild bibasilar atelectasis.  Daughter noted prior history of patient being septic from parotitis.  CT scan of the head and maxillofacial did not note any acute abnormality with chronic atrophic and ischemic changes of the brain.  Patient has been given a bolus of 3 L of lactated Ringer's, Zosyn, remdesivir, and started on lactated Ringer's at 150 mL/h.  Due to increased work of breathing after IV fluids patient had been given Lasix 40 mg IV x1 dose.   Review of Systems: unable to review all systems due to the inability of the patient to answer questions. Past Medical History:  Diagnosis Date   Alzheimer disease ([place])    Chronic kidney disease    mild insuffiency   Coronary artery disease    LHC 4/10: Mid LAD 40-50%, then 70%, OM1 20-30%, EF 65%. Mid LAD FFR 0.89 (not hemodynamically significant). Medical therapy was continued.   Dysuria 08/10/2020   Hyperlipidemia    LBBB (left bundle branch block) 06/2018   Obstructive sleep apnea    Past Surgical History:  Procedure Laterality Date   CARDIAC CATHETERIZATION  05/02/2003   single vessel,moderate  stenosis mid LAD   CARDIAC CATHETERIZATION  06/12/2008   continue med. therapy   right shoulder surgery     TOTAL HIP ARTHROPLASTY Left 09/14/2018   Procedure: LEFT TOTAL HIP ARTHROPLASTY ANTERIOR APPROACH;  Surgeon: Mcarthur Rossetti, MD;  Location: [facility];  Service: Orthopedics;  Laterality: Left;   Social History:  reports that he quit smoking about 31 years ago. His smoking use included pipe. He has never used smokeless tobacco. He reports that he does not drink alcohol and does not use drugs.  Allergies  Allergen Reactions   Enablex [Darifenacin Hydrobromide Er]     PT STATES IT MAKES HIM "BONKERS" AND VERY TIRED   Ceftriaxone Other (See  Comments)    Unknown reaction   Atorvastatin Other (See Comments)    Muscle aches/weakness   Ezetimibe Other (See Comments)    Muscle aches/weakness   Levaquin [Levofloxacin] Diarrhea   Oxybutynin Other (See Comments)    Memory impairment, temperament changes   Sulfamethoxazole Other (See Comments)    Childhood allergy   Sulfasalazine Other (See Comments)    Childhood allergy   Sulfonamide Derivatives Other (See Comments)    Childhood allergy    Family History  Problem Relation Age of Onset   Cancer Mother    Hypertension Mother    Other Father        old age   Hypertension Father     Prior to Admission medications   Medication Sig Start Date End Date Taking? Authorizing Provider  albuterol (VENTOLIN HFA) 108 (90 Base) MCG/ACT inhaler Inhale 1 puff into the lungs every 4 (four) hours as needed for wheezing or shortness of breath.    [provider]  apixaban (ELIQUIS) 5 MG TABS tablet Take 1 tablet (5 mg total) by mouth 2 (two) times daily. 01/14/21   [facility], Tami Lin, PA  calcium-vitamin D (OSCAL WITH D) 500-200 MG-UNIT per tablet Take 1 tablet by mouth 2 (two) times daily.    [provider]  docusate sodium (COLACE) 100 MG capsule Take 200 mg by mouth 2 (two) times daily.    [provider]  donepezil (ARICEPT) 10 MG tablet Take 10 mg by mouth at bedtime. 12/01/16   [provider]  fesoterodine (TOVIAZ) 8 MG TB24 tablet Take 8 mg by mouth every morning. 12/13/18   [provider]  fluticasone (FLONASE) 50 MCG/ACT nasal spray Place 2 sprays into both nostrils daily as needed for allergies. 05/15/17   [provider]  furosemide (LASIX) 20 MG tablet Take 1 tablet (20 mg total) by mouth daily. 09/07/20   Sande Rives E, PA-C  lisinopril (ZESTRIL) 20 MG tablet Take 20 mg by mouth every morning. 08/23/18   [provider]  metoprolol tartrate (LOPRESSOR) 25 MG tablet Take 1 tablet (25 mg total) by mouth 2 (two)  times daily. 01/14/21 04/14/21  Ledora Bottcher, PA  mirabegron ER (MYRBETRIQ) 50 MG TB24 tablet Take 50 mg by mouth every morning.    [provider]  Misc Natural Products (OSTEO BI-FLEX/5-LOXIN ADVANCED) TABS Take 1 tablet by mouth 2 (two) times daily.    [provider]  Multiple Vitamin (MULTIVITAMIN WITH MINERALS) TABS tablet Take 1 tablet by mouth every morning.    [provider]  nitrofurantoin, macrocrystal-monohydrate, (MACROBID) 100 MG capsule Take 1 capsule (100 mg total) by mouth in the morning. 01/23/21   Mercy Riding, MD  Omega-3 Fatty Acids (FISH OIL) 1200 MG CAPS Take 1,200 mg by mouth 2 (  two) times daily.    [provider]  PRESCRIPTION MEDICATION Inhale into the lungs at bedtime. CPAP    [provider]  Psyllium (METAMUCIL PO) Take 3.4 g by mouth See admin instructions. Mix 3.4 grams of powder into 8 ounces of water or juice and drink once a day    [provider]  rosuvastatin (CRESTOR) 10 MG tablet TAKE 1/2 TABLET BY MOUTH EVERY OTHER DAY Patient taking differently: Take 5 mg by mouth every other day. 06/22/20   [place], Peter M, MD    Physical Exam: Vitals:   04/02/21 0630 04/02/21 0645 04/02/21 0700 04/02/21 0742  BP: 128/60 123/62 121/66   Pulse: 70 66 69   Resp: (!) 25 (!) 21 11   Temp:    98.6 F (37 C)  TempSrc:    Oral  SpO2: 93% 94% 96%   Weight:      Height:       Exam  Constitutional: Elderly male who appears to be acutely ill.  Patient is not willing to cooperate with physical examination speech is [place]. Eyes: PERRL, lids and conjunctivae normal ENMT: Mucous membranes are moist.  Neck: No significant signs of JVD appreciated at this time Respiratory: Mildly tachypneic without significant wheezes or rhonchi appreciated at this time.  O2 saturation currently maintained liters nasal cannula oxygen.   Cardiovascular: Regular rate and rhythm, no murmurs / rubs / gallops. Trace lower extremity  edema. Abdomen: no tenderness seems to be present, but patient does squat my hand away from his abdomen.  Bowel sounds were appreciated. Musculoskeletal: no clubbing / cyanosis.  No visible joint deformity. Skin: no rashes, lesions, ulcers. No induration Neurologic: CN 2-12 grossly intact. Sensation intact, DTR normal. Strength 5/5 in all 4.  Psychiatric: Alert and oriented to person and place, but not situation or time. Agitated mood   Data Reviewed:  Labs were significant for BUN 18, creatinine 1.25, glucose 164, lactic acid 2.3->2.4->2.1.  Urinalysis noted trace leukocytes with rare bacteria and 6-10 WBCs.  Chest x-ray noted mild bibasilar atelectasis.  CT scan of the head and maxillofacial did not note any acute abnormality with chronic atrophic and ischemic changes of the brain.   Assessment and Plan: * Encephalopathy due to COVID-19 virus- (present on admission) Patient was noted to be acutely altered by his daughter last night.  Reported normally to be alert and oriented x4, but was only alert and oriented x1 upon EMS arrival.  COVID-19 screening was positive.  O2 saturations have been maintained on room air.  Chest x-ray noted mild bibasilar atelectasis.  He had received empiric antibiotics and remdesivir. -Admit to medical telemetry bed -COVID-19 order set utilized -Check inflammatory markers -Neurochecks -Continue remdesivir per pharmacy -Albuterol inhaler -Antitussives as needed -Vitamin C and zinc -Tylenol as needed for fever  SIRS (systemic inflammatory response syndrome) ([place])- (present on admission) Patient was noted to be febrile up to 103.1 F with meeting SIRS criteria.  Lactic acid was initially noted to be elevated at 2.3-> 2.4->2.1.  Chest x-ray did not note clear signs of infection.  Urinalysis was abnormal with trace leukocytes, rare bacteria, and 6-10 WBCs.  Patient has been started on empiric antibiotics of Zosyn. -Follow-up blood cultures -Continue to trend  lactic acid levels -Continue antibiotics of Zosyn, but de-escalate when medically appropriate  Acute respiratory distress- (present on admission) Patient was noted to be tachypneic and was placed on 3 L nasal cannula oxygen, but was never have been documented to be hypoxic.  This could all be related to COVID-19.  Of note he had been given significant amount of fluid on arrival for which he was given Lasix shortly thereafter as an additional cause, and the fact he had been off of Eliquis since 1/31.  D-dimer was noted to be elevated 1.4 -Will check room air oxygen saturations -Continuous pulse oximetry with nasal cannula oxygen maintain O2 saturation greater than 92% -Check CT angiogram of the chest rule out PE as patient has been off of his anticoagulation  Chronic diastolic CHF (congestive heart failure) ([place])- (present on admission) Last EF noted to be 60- 65% with grade 1 diastolic dysfunction back in 04/2020.  BNP was elevated at 437 -Strict I&O's and daily weight -Will consider need of additional Lasix IV  Paroxysmal atrial fibrillation ([place])- (present on admission) Patient appears to be currently rate controlled.  He had not been on Eliquis since 1/31 due to issues with insurance. -Continue metoprolol and will restart Eliquis -Question transitions of care if needing to change to different medication for anticoagulation  Dementia with behavioral disturbance- (present on admission) -Continue donepezil  Stage 3a chronic kidney disease ([place])- (present on admission) Creatinine 1.25 on admission which appears relatively around patient's baseline which ranges from 0.9-1.2. -Continue to monitor  OSA on CPAP - Continue CPAP at night  Advance Care Planning:   Code Status: Full Code   Consults: None  Family Communication: Daughter updated over the phone  Severity of Illness: The appropriate patient status for this patient is INPATIENT. Inpatient status is judged to be reasonable and  necessary in order to provide the required intensity of service to ensure the patient's safety. The patient's presenting symptoms, physical exam findings, and initial radiographic and laboratory data in the context of their chronic comorbidities is felt to place them at high risk for further clinical deterioration. Furthermore, it is not anticipated that the patient will be medically stable for discharge from the hospital within 2 midnights of admission.   * I certify that at the point of admission it is my clinical judgment that the patient will require inpatient hospital care spanning beyond 2 midnights from the point of admission due to high intensity of service, high risk for further deterioration and high frequency of surveillance required.*  Author: Norval Morton, MD 04/02/2021 8:12 AM  For on call review [email or web address].

## 2021-04-02 NOTE — ED Notes (Signed)
Patient transported to CT 

## 2021-04-02 NOTE — Assessment & Plan Note (Addendum)
Much more awake and alert compared to the past few days-suspect he is back to baseline-CT head without any acute abnormalities.  Completed a course of Remdesivir.

## 2021-04-02 NOTE — ED Provider Notes (Signed)
[facility] EMERGENCY DEPARTMENT Provider Note   CSN: [id number] Arrival date & time: 04/02/21  0130     History  Chief Complaint  Patient presents with   Altered Mental Status   Level 5 caveat due to altered mental status [name] [name] is a 81 y.o. male.  The history is provided by the patient and a relative.  Altered Mental Status Presenting symptoms: confusion   Severity:  Moderate Timing:  Constant Progression:  Worsening Chronicity:  New Context: dementia   Associated symptoms: fever   Patient with known history of Alzheimer's disease, chronic kidney disease, CAD presents with fever and altered mental status.  Daughter reports that she found him in the shower after getting home from a movie and was very confused.  She reports he has had a history of previous facial and neck infection and she concerned that this is getting worse No recent falls or trauma.  On my evaluation, patient denies any complaints   Past Medical History:  Diagnosis Date   Alzheimer disease ([place])    Chronic kidney disease    mild insuffiency   Coronary artery disease    LHC 4/10: Mid LAD 40-50%, then 70%, OM1 20-30%, EF 65%. Mid LAD FFR 0.89 (not hemodynamically significant). Medical therapy was continued.   Dysuria 08/10/2020   Hyperlipidemia    LBBB (left bundle branch block) 06/2018   Obstructive sleep apnea     Home Medications Prior to Admission medications   Medication Sig Start Date End Date Taking? Authorizing Provider  albuterol (VENTOLIN HFA) 108 (90 Base) MCG/ACT inhaler Inhale 1 puff into the lungs every 4 (four) hours as needed for wheezing or shortness of breath.    [provider]  apixaban (ELIQUIS) 5 MG TABS tablet Take 1 tablet (5 mg total) by mouth 2 (two) times daily. 01/14/21   D[facility], Tami Lin, PA  calcium-vitamin D (OSCAL WITH D) 500-200 MG-UNIT per tablet Take 1 tablet by mouth 2 (two) times daily.    [provider]  docusate  sodium (COLACE) 100 MG capsule Take 200 mg by mouth 2 (two) times daily.    [provider]  donepezil (ARICEPT) 10 MG tablet Take 10 mg by mouth at bedtime. 12/01/16   [provider]  fesoterodine (TOVIAZ) 8 MG TB24 tablet Take 8 mg by mouth every morning. 12/13/18   [provider]  fluticasone (FLONASE) 50 MCG/ACT nasal spray Place 2 sprays into both nostrils daily as needed for allergies. 05/15/17   [provider]  furosemide (LASIX) 20 MG tablet Take 1 tablet (20 mg total) by mouth daily. 09/07/20   Sande Rives E, PA-C  lisinopril (ZESTRIL) 20 MG tablet Take 20 mg by mouth every morning. 08/23/18   [provider]  metoprolol tartrate (LOPRESSOR) 25 MG tablet Take 1 tablet (25 mg total) by mouth 2 (two) times daily. 01/14/21 04/14/21  Ledora Bottcher, PA  mirabegron ER (MYRBETRIQ) 50 MG TB24 tablet Take 50 mg by mouth every morning.    [provider]  Misc Natural Products (OSTEO BI-FLEX/5-LOXIN ADVANCED) TABS Take 1 tablet by mouth 2 (two) times daily.    [provider]  Multiple Vitamin (MULTIVITAMIN WITH MINERALS) TABS tablet Take 1 tablet by mouth every morning.    [provider]  nitrofurantoin, macrocrystal-monohydrate, (MACROBID) 100 MG capsule Take 1 capsule (100 mg total) by mouth in the morning. 01/23/21   Mercy Riding, MD  Omega-3 Fatty Acids (FISH OIL) 1200 MG CAPS  Take 1,200 mg by mouth 2 (two) times daily.    [provider]  PRESCRIPTION MEDICATION Inhale into the lungs at bedtime. CPAP    [provider]  Psyllium (METAMUCIL PO) Take 3.4 g by mouth See admin instructions. Mix 3.4 grams of powder into 8 ounces of water or juice and drink once a day    [provider]  rosuvastatin (CRESTOR) 10 MG tablet TAKE 1/2 TABLET BY MOUTH EVERY OTHER DAY Patient taking differently: Take 5 mg by mouth every other day. 06/22/20   Martinique, Peter M, MD      Allergies    Enablex  [darifenacin hydrobromide er], Ceftriaxone, Atorvastatin, Ezetimibe, Levaquin [levofloxacin], Oxybutynin, Sulfamethoxazole, Sulfasalazine, and Sulfonamide derivatives    Review of Systems   Review of Systems  Unable to perform ROS: Mental status change  Constitutional:  Positive for fever.  Psychiatric/Behavioral:  Positive for confusion.    Physical Exam Updated Vital Signs BP (!) 146/65    Pulse 77    Temp (!) 103.1 F (39.5 C) (Oral)    Resp (!) 25    Ht 1.778 m (5\' 10" )    Wt 95.3 kg    SpO2 96%    BMI 30.13 kg/m  Physical Exam CONSTITUTIONAL: Elderly, no acute distress HEAD: Normocephalic/atraumatic EYES: EOMI/PERRL ENMT: Mucous membranes moist, poor dentition. There is mild swelling noted in the left preauricular region.  No crepitus or fluctuance. There is no obvious intraoral abscesses.  No trismus, no stridor NECK: supple no meningeal signs, no anterior neck edema or lymphadenopathy SPINE/BACK:entire spine nontender CV: S1/S2 noted, mild tachycardia LUNGS: Lungs are clear to auscultation bilaterally, no apparent distress ABDOMEN: soft, nontender, no rebound or guarding, bowel sounds noted throughout abdomen GU:no cva tenderness, normal external genitalia NEURO: Pt is awake/alert/, moves all extremitiesx4.  No facial droop.  Patient appears mildly confused but answers most questions appropriately EXTREMITIES: pulses normal/equal, full ROM SKIN: warm, color normal, no rash noted PSYCH: Unable to fully assess  ED Results / Procedures / Treatments   Labs (all labs ordered are listed, but only abnormal results are displayed) Labs Reviewed  RESP PANEL BY RT-PCR (FLU A&B, COVID) ARPGX2 - Abnormal; Notable for the following components:      Result Value   SARS Coronavirus 2 by RT PCR POSITIVE (*)    All other components within normal limits  LACTIC ACID, PLASMA - Abnormal; Notable for the following components:   Lactic Acid, Venous 2.3 (*)    All other components within  normal limits  LACTIC ACID, PLASMA - Abnormal; Notable for the following components:   Lactic Acid, Venous 2.4 (*)    All other components within normal limits  COMPREHENSIVE METABOLIC PANEL - Abnormal; Notable for the following components:   Glucose, Bld 164 (*)    Creatinine, Ser 1.25 (*)    GFR, Estimated 57 (*)    All other components within normal limits  CBC WITH DIFFERENTIAL/PLATELET - Abnormal; Notable for the following components:   RBC 4.20 (*)    Lymphs Abs 0.5 (*)    All other components within normal limits  URINALYSIS, ROUTINE W REFLEX MICROSCOPIC - Abnormal; Notable for the following components:   Leukocytes,Ua TRACE (*)    All other components within normal limits  URINALYSIS, MICROSCOPIC (REFLEX) - Abnormal; Notable for the following components:   Bacteria, UA RARE (*)    All other components within normal limits  CULTURE, BLOOD (ROUTINE X 2)  CULTURE, BLOOD (ROUTINE X 2)  URINE CULTURE  PROTIME-INR  APTT    EKG EKG Interpretation  Date/Time:  Friday April 02 2021 01:33:59 EST Ventricular Rate:  85 PR Interval:  189 QRS Duration: 146 QT Interval:  377 QTC Calculation: 449 R Axis:   -1 Text Interpretation: Sinus rhythm Left bundle branch block Confirmed by Ripley Fraise [phone]) on 04/02/2021 1:46:17 AM  Radiology CT Head Wo Contrast  Result Date: 04/02/2021 CLINICAL DATA:  Altered mental status EXAM: CT HEAD WITHOUT CONTRAST CT MAXILLOFACIAL WITHOUT CONTRAST TECHNIQUE: Multidetector CT imaging of the head and maxillofacial structures were performed using the standard protocol without intravenous contrast. Multiplanar CT image reconstructions of the maxillofacial structures were also generated. RADIATION DOSE REDUCTION: This exam was performed according to the departmental dose-optimization program which includes automated exposure control, adjustment of the mA and/or kV according to patient size and/or use of iterative reconstruction technique. COMPARISON:   09/11/2020 FINDINGS: CT HEAD FINDINGS Brain: No evidence of acute infarction, hemorrhage, hydrocephalus, extra-axial collection or mass lesion/mass effect. Mild atrophic changes and chronic white matter ischemic changes are noted. Vascular: No hyperdense vessel or unexpected calcification. Skull: Normal. Negative for fracture or focal lesion. Other: None. CT MAXILLOFACIAL FINDINGS Osseous: No acute bony abnormality is noted. Orbits: Orbits and their contents are within normal limits. Sinuses: Paranasal sinuses show no mucosal thickening or air-fluid levels. Soft tissues: Surrounding soft tissue structures show no acute abnormality. Specifically the parotid glands are within normal limits bilaterally. IMPRESSION: CT of the head: Chronic atrophic and ischemic changes without acute abnormality. CT of the maxillofacial bones: No acute bony or soft tissue abnormality is noted. Electronically Signed   By: Inez Catalina M.D.   On: 04/02/2021 03:52   CT Maxillofacial W Contrast  Result Date: 04/02/2021 CLINICAL DATA:  Altered mental status EXAM: CT HEAD WITHOUT CONTRAST CT MAXILLOFACIAL WITHOUT CONTRAST TECHNIQUE: Multidetector CT imaging of the head and maxillofacial structures were performed using the standard protocol without intravenous contrast. Multiplanar CT image reconstructions of the maxillofacial structures were also generated. RADIATION DOSE REDUCTION: This exam was performed according to the departmental dose-optimization program which includes automated exposure control, adjustment of the mA and/or kV according to patient size and/or use of iterative reconstruction technique. COMPARISON:  09/11/2020 FINDINGS: CT HEAD FINDINGS Brain: No evidence of acute infarction, hemorrhage, hydrocephalus, extra-axial collection or mass lesion/mass effect. Mild atrophic changes and chronic white matter ischemic changes are noted. Vascular: No hyperdense vessel or unexpected calcification. Skull: Normal. Negative for  fracture or focal lesion. Other: None. CT MAXILLOFACIAL FINDINGS Osseous: No acute bony abnormality is noted. Orbits: Orbits and their contents are within normal limits. Sinuses: Paranasal sinuses show no mucosal thickening or air-fluid levels. Soft tissues: Surrounding soft tissue structures show no acute abnormality. Specifically the parotid glands are within normal limits bilaterally. IMPRESSION: CT of the head: Chronic atrophic and ischemic changes without acute abnormality. CT of the maxillofacial bones: No acute bony or soft tissue abnormality is noted. Electronically Signed   By: Inez Catalina M.D.   On: 04/02/2021 03:52   DG Chest Port 1 View  Result Date: 04/02/2021 CLINICAL DATA:  Possible sepsis EXAM: PORTABLE CHEST 1 VIEW COMPARISON:  01/07/2021 FINDINGS: Cardiac shadow is enlarged but stable. Lungs are well aerated bilaterally. Mild bibasilar atelectasis is seen. No acute bony abnormality is noted. IMPRESSION: Mild bibasilar atelectasis. Electronically Signed   By: Inez Catalina M.D.   On: 04/02/2021 02:34    Procedures .Critical Care Performed by: Ripley Fraise, MD Authorized by: Ripley Fraise, MD   Critical care provider  statement:    Critical care time (minutes):  121   Critical care start time:  04/02/2021 3:00 AM   Critical care end time:  04/02/2021 5:01 AM   Critical care time was exclusive of:  Separately billable procedures and treating other patients   Critical care was necessary to treat or prevent imminent or life-threatening deterioration of the following conditions:  Sepsis, CNS failure or compromise and respiratory failure   Critical care was time spent personally by me on the following activities:  Development of treatment plan with patient or surrogate, discussions with consultants, evaluation of patient's response to treatment, examination of patient, obtaining history from patient or surrogate, review of old charts, re-evaluation of patient's condition, pulse oximetry,  ordering and review of radiographic studies, ordering and review of laboratory studies and ordering and performing treatments and interventions   I assumed direction of critical care for this patient from another provider in my specialty: no     Care discussed with: admitting provider      Medications Ordered in ED Medications  lactated ringers infusion ( Intravenous Not Given 04/02/21 0316)  remdesivir 200 mg in sodium chloride 0.9% 250 mL IVPB (has no administration in time range)    Followed by  remdesivir 100 mg in sodium chloride 0.9 % 100 mL IVPB (has no administration in time range)  lactated ringers bolus 1,000 mL (0 mLs Intravenous Stopped 04/02/21 0309)    And  lactated ringers bolus 1,000 mL (0 mLs Intravenous Stopped 04/02/21 0309)    And  lactated ringers bolus 1,000 mL (0 mLs Intravenous Stopped 04/02/21 0309)  piperacillin-tazobactam (ZOSYN) IVPB 3.375 g (0 g Intravenous Stopped 04/02/21 0235)  acetaminophen (TYLENOL) tablet 650 mg (650 mg Oral Given 04/02/21 0346)  iohexol (OMNIPAQUE) 300 MG/ML solution 75 mL (75 mLs Intravenous Contrast Given 04/02/21 0347)  furosemide (LASIX) injection 40 mg (40 mg Intravenous Given 04/02/21 0421)    ED Course/ Medical Decision Making/ A&P Clinical Course as of 04/02/21 0500  Fri Apr 02, 2021  0205 Daughter is at bedside.  She reports she came home from a movie and was found in the shower confused.  No recent falls or trauma.  She is concerned that the left facial proctitis is getting worse.  She reports it does seem swollen she is requesting CT imaging.  She reports it improves with Zosyn [DW]  0257 Glucose(!): 164 Hyperglycemia [DW]  0257 Creatinine(!): 1.25 Mild renal insufficiency [DW]  0257 Lactic Acid, Venous(!!): 2.3 Mild lactic acidosis [DW]  0313 Patient found to be positive for COVID-19 [DW]  0416 Patient awake and alert, mildly tachypneic.  He does have some congestion in his lungs at this time.  He has not had his Lasix recently, will  give IV dose at this time and admit.  CT imaging is negative [DW]  1443 Discussed with Dr. Marlowe Sax for admission Remdesivir has been ordered for COVID-19 Patient to be admitted for COVID-19 with associated altered mental status [DW]    Clinical Course User Index [DW] Ripley Fraise, MD                           Medical Decision Making Amount and/or Complexity of Data Reviewed Independent Historian: caregiver External Data Reviewed: notes. Labs: ordered. Decision-making details documented in ED Course. Radiology: ordered and independent interpretation performed. Decision-making details documented in ED Course. ECG/medicine tests: ordered and independent interpretation performed. Decision-making details documented in ED Course.  Risk OTC drugs.  Prescription drug management. Decision regarding hospitalization.   This patient presents to the ED for concern of fever and altered mental status, this involves an extensive number of treatment options, and is a complaint that carries with it a high risk of complications and morbidity.  The differential diagnosis includes meningitis, encephalitis, urosepsis, pneumonia, deep space neck infection, COVID-19  Comorbidities that complicate the patient evaluation: Patients presentation is complicated by their history of dementia, atrial fibrillation  Social Determinants of Health: Patients history of dementia increases the complexity of managing their presentation  Additional history obtained: Additional history obtained from family Records reviewed previous admission documents Patient previously admitted for deep space infection to his neck  Lab Tests: I Ordered, and personally interpreted labs.  The pertinent results include: Mild lactic acidosis, renal insufficiency  Imaging Studies ordered: I ordered imaging studies including X-ray chest x-ray is negative CT head and face no acute finding I independently visualized and interpreted  imaging which showed no acute findings on x-ray I agree with the radiologist interpretation  Cardiac Monitoring: The patient was maintained on a cardiac monitor.  I personally viewed and interpreted the cardiac monitor which showed an underlying rhythm of:  sinus rhythm  Medicines ordered and prescription drug management: I ordered medication including IV fluids and for sepsis Reevaluation of the patient after these medicines showed that the patient    stayed the same    Critical Interventions:       IV fluids and antibiotics  Consultations Obtained: I requested consultation with the admitting physician Dr. Marlowe Sax, and discussed  findings as well as pertinent plan - they recommend: Admission  Reevaluation: After the interventions noted above, I reevaluated the patient and found that they have :stayed the same  Complexity of problems addressed: Patients presentation is most consistent with  acute presentation with potential threat to life or bodily function      Disposition: After consideration of the diagnostic results and the patients response to treatment,  I feel that the patent would benefit from admission .            Final Clinical Impression(s) / ED Diagnoses Final diagnoses:  Delirium  COVID-19    Rx / DC Orders ED Discharge Orders     None         Ripley Fraise, MD 04/02/21 [phone]

## 2021-04-02 NOTE — Assessment & Plan Note (Addendum)
SIRS physiology improved-continue to treat underlying COVID-19 infection-cultures negative so far-has completed a course of Remdesivir.

## 2021-04-02 NOTE — Assessment & Plan Note (Addendum)
Euvolemic on exam-continue home dosing of Lasix.

## 2021-04-02 NOTE — Assessment & Plan Note (Addendum)
Mental status is improved-minimally confused at best-continue Aricept on discharge.  Delirium precautions were maintained.  Follow-up with primary care practitioner.

## 2021-04-02 NOTE — Assessment & Plan Note (Addendum)
Continue CPAP at night ?

## 2021-04-02 NOTE — Assessment & Plan Note (Addendum)
Close to baseline.  Monitor periodically.

## 2021-04-02 NOTE — ED Notes (Signed)
RN assessing pt on room air. Pt currently off oxygen. O2 96%

## 2021-04-03 DIAGNOSIS — I728 Aneurysm of other specified arteries: Secondary | ICD-10-CM | POA: Diagnosis present

## 2021-04-03 DIAGNOSIS — I10 Essential (primary) hypertension: Secondary | ICD-10-CM

## 2021-04-03 LAB — PHOSPHORUS: Phosphorus: 3.8 mg/dL (ref 2.5–4.6)

## 2021-04-03 LAB — CBC
HCT: 39.8 % (ref 39.0–52.0)
Hemoglobin: 12.9 g/dL — ABNORMAL LOW (ref 13.0–17.0)
MCH: 30.5 pg (ref 26.0–34.0)
MCHC: 32.4 g/dL (ref 30.0–36.0)
MCV: 94.1 fL (ref 80.0–100.0)
Platelets: 180 10*3/uL (ref 150–400)
RBC: 4.23 MIL/uL (ref 4.22–5.81)
RDW: 13.2 % (ref 11.5–15.5)
WBC: 5.8 10*3/uL (ref 4.0–10.5)
nRBC: 0 % (ref 0.0–0.2)

## 2021-04-03 LAB — BASIC METABOLIC PANEL
Anion gap: 12 (ref 5–15)
BUN: 17 mg/dL (ref 8–23)
CO2: 22 mmol/L (ref 22–32)
Calcium: 9.1 mg/dL (ref 8.9–10.3)
Chloride: 105 mmol/L (ref 98–111)
Creatinine, Ser: 1.21 mg/dL (ref 0.61–1.24)
GFR, Estimated: 60 mL/min — ABNORMAL LOW (ref >60)
Glucose, Bld: 123 mg/dL — ABNORMAL HIGH (ref 70–99)
Potassium: 3.8 mmol/L (ref 3.5–5.1)
Sodium: 139 mmol/L (ref 135–145)

## 2021-04-03 LAB — MAGNESIUM: Magnesium: 1.8 mg/dL (ref 1.7–2.4)

## 2021-04-03 MED ORDER — MAGNESIUM SULFATE 2 GM/50ML IV SOLN
2.0000 g | Freq: Once | INTRAVENOUS | Status: AC
  Administered 2021-04-03: 2 g via INTRAVENOUS
  Filled 2021-04-03: qty 50

## 2021-04-03 MED ORDER — FUROSEMIDE 20 MG PO TABS
20.0000 mg | ORAL_TABLET | Freq: Every day | ORAL | Status: DC
  Administered 2021-04-03 – 2021-04-06 (×4): 20 mg via ORAL
  Filled 2021-04-03 (×4): qty 1

## 2021-04-03 MED ORDER — NITROFURANTOIN MONOHYD MACRO 100 MG PO CAPS
100.0000 mg | ORAL_CAPSULE | Freq: Every morning | ORAL | Status: DC
  Administered 2021-04-04 – 2021-04-06 (×3): 100 mg via ORAL
  Filled 2021-04-03 (×3): qty 1

## 2021-04-03 NOTE — Progress Notes (Signed)
PROGRESS NOTE        PATIENT DETAILS Name: [name] [name] Age: 81 y.o. Sex: male Date of Birth: Dec 23, 1940 Admit Date: 04/02/2021 Admitting Physician Norval Morton, MD WGN:[id number], Rebeca Alert, MD  Brief Summary: Patient is a 81 y.o.  male with history of PAF on Eliquis, HFpEF, HTN, dementia-whose daughter was recently diagnosed with COVID-19 infection-started having nasal congestion/URI-like symptoms on the day of admission-then was noted to be confused-he was brought to the ED where he was found to be febrile-mildly hypoxic with tachypnea-further evaluation revealed acute COVID-19 infection.  He was subsequently admitted to the hospitalist service.  See below for further details.    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  No family at bedside.  Objective: Vitals: Blood pressure 134/60, pulse 65, temperature 98.3 F (36.8 C), temperature source Oral, resp. rate 15, height 5\' 10"  (1.778 m), weight 95.3 kg, SpO2 94 %.   Exam: Gen Exam: Not in distress. HEENT:atraumatic, normocephalic Chest: B/L clear to auscultation anteriorly CVS:S1S2 regular Abdomen:soft non tender, non distended Extremities:no edema Neurology: Non focal Skin: no rash  Pertinent Labs/Radiology: CBC Latest Ref Rng & Units 04/03/2021 04/02/2021 03/04/2021  WBC 4.0 - 10.5 K/uL 5.8 6.6 6.4  Hemoglobin 13.0 - 17.0 g/dL 12.9(L) 13.0 13.4  Hematocrit 39.0 - 52.0 % 39.8  39.5 40.7  Platelets 150 - 400 K/uL 180 220 286    Lab Results  Component Value Date   NA 139 04/03/2021   K 3.8 04/03/2021   CL 105 04/03/2021   CO2 22 04/03/2021      Assessment/Plan: * Encephalopathy due to COVID-19 virus- (present on admission) Remains somewhat pleasantly confused-unclear what his baseline is (history of dementia)-no family at bedside-CT head without any acute abnormalities.  Continue supportive care.  Maintain delirium precautions.  Continue to treat underlying COVID-19 infection.  SIRS (systemic inflammatory response syndrome) ([place])- (present on admission) SIRS physiology improved-continue to treat underlying COVID-19 infection-on Remdesivir.  Follow cultures.   Acute respiratory distress- (present on admission) Appears to have mild hypoxemia-no obvious PNA/PE on CTA chest.  Encourage incentive spirometry-continue supportive care and attempt to titrate off oxygen.    Chronic diastolic CHF-EF noted to be 60- 65% ( grade 1 diastolic dysfunction by TTE in 04/2020)- (present on admission) Euvolemic on exam-resume home dosing of Lasix.  Paroxysmal atrial fibrillation ([place])- (present on admission) Maintaining sinus rhythm-on metoprolol and Eliquis.   Benign essential hypertension- (present on admission) BP stable on Coreg/lisinopril.  Follow and adjust accordingly.  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  Dementia with behavioral disturbance- (present on admission) Pleasantly confused this morning-unclear baseline-maintain delirium precautions-continue Aricept.  Given acute illness/hospitalization-probably will not be at baseline during this hospital stay.  Stage 3a chronic kidney disease ([place])- (present on admission) Close to baseline.  Monitor periodically.  OSA on CPAP  Continue CPAP at night  Splenic artery aneurysm ([place])- (present  on  admission) Incidental finding-outpatient vascular surgery evaluation.  BMI: Estimated body mass index is 30.13 kg/m as calculated from the following:   Height as of this encounter: 5\' 10"  (1.778 m).   Weight as of this encounter: 95.3 kg.   Code status:   Code Status: Full Code   DVT Prophylaxis: apixaban (ELIQUIS) tablet 5 mg     Family Communication: Daughter-[phone] updated on 2/4  Disposition Plan: Status is: Inpatient Remains inpatient appropriate because: COVID 19 infection with hypoxia and encephalopathy-not yet stable for discharge.  PT recommending SNF.    Planned Discharge Destination:Skilled nursing facility   Diet: Diet Order             Diet Heart Room service appropriate? Yes; Fluid consistency: Thin  Diet effective now                     Antimicrobial agents: Anti-infectives (From admission, onward)    Start     Dose/Rate Route Frequency Ordered Stop   04/04/21 0700  nitrofurantoin (macrocrystal-monohydrate) (MACROBID) capsule 100 mg        100 mg Oral Every morning 04/03/21 1128     04/03/21 1000  remdesivir 100 mg in sodium chloride 0.9 % 100 mL IVPB       See Hyperspace for full Linked Orders Report.   100 mg 200 mL/hr over 30 Minutes Intravenous Daily 04/02/21 0431 04/07/21 0959   04/02/21 0500  remdesivir 200 mg in sodium chloride 0.9% 250 mL IVPB       See Hyperspace for full Linked Orders Report.   200 mg 580 mL/hr over 30 Minutes Intravenous Once 04/02/21 0431 04/02/21 0602   04/02/21 0215  piperacillin-tazobactam (ZOSYN) IVPB 3.375 g        3.375 g 100 mL/hr over 30 Minutes Intravenous  Once 04/02/21 0203 04/02/21 0235   04/02/21 0200  metroNIDAZOLE (FLAGYL) IVPB 500 mg  Status:  Discontinued        500 mg 100 mL/hr over 60 Minutes Intravenous  Once 04/02/21 0154 04/02/21 0202   04/02/21 0200  vancomycin (VANCOCIN) IVPB 1000 mg/200 mL premix  Status:  Discontinued        1,000 mg 200 mL/hr over 60 Minutes Intravenous  Once  04/02/21 0154 04/02/21 0157   04/02/21 0200  vancomycin (VANCOREADY) IVPB 2000 mg/400 mL  Status:  Discontinued        2,000 mg 200 mL/hr over 120 Minutes Intravenous  Once 04/02/21 0157 04/02/21 0203        MEDICATIONS: Scheduled Meds:  albuterol  2 puff Inhalation Q6H   apixaban  5 mg Oral BID   vitamin C  500 mg Oral Daily   carvedilol  3.125 mg Oral BID   donepezil  10 mg Oral QHS   fesoterodine  8 mg Oral QHS   furosemide  20 mg Oral Daily   lisinopril  20 mg Oral QHS   mirabegron ER  50 mg Oral q morning   [START ON 04/04/2021] nitrofurantoin (macrocrystal-monohydrate)  100 mg Oral q AM   rosuvastatin  5 mg Oral QODAY   sodium chloride flush  3 mL Intravenous Q12H   zinc sulfate  220 mg Oral Daily   Continuous Infusions:  remdesivir 100 mg in NS 100 mL 100 mg (04/03/21 0918)   PRN Meds:.acetaminophen **OR** acetaminophen, chlorpheniramine-HYDROcodone, fluticasone, guaiFENesin-dextromethorphan, ondansetron **OR** ondansetron (ZOFRAN) IV   I have personally reviewed following labs and imaging studies  LABORATORY DATA: CBC: Recent Labs  Lab  04/02/21 0154 04/03/21 0106  WBC 6.6 5.8  NEUTROABS 5.5  --   HGB 13.0 12.9*  HCT 39.5 39.8  MCV 94.0 94.1  PLT 220 749    Basic Metabolic Panel: Recent Labs  Lab 04/02/21 0154 04/02/21 1037 04/03/21 0106  NA 139  --  139  K 4.0  --  3.8  CL 107  --  105  CO2 22  --  22  GLUCOSE 164*  --  123*  BUN 18  --  17  CREATININE 1.25*  --  1.21  CALCIUM 10.1  --  9.1  MG  --  1.8 1.8  PHOS  --  3.5 3.8    GFR: Estimated Creatinine Clearance: 54.5 mL/min (by C-G formula based on SCr of 1.21 mg/dL).  Liver Function Tests: Recent Labs  Lab 04/02/21 0154  AST 29  ALT 36  ALKPHOS 68  BILITOT 1.0  PROT 6.8  ALBUMIN 3.7   No results for input(s): LIPASE, AMYLASE in the last 168 hours. No results for input(s): AMMONIA in the last 168 hours.  Coagulation Profile: Recent Labs  Lab 04/02/21 0154  INR 1.1     Cardiac Enzymes: No results for input(s): CKTOTAL, CKMB, CKMBINDEX, TROPONINI in the last 168 hours.  BNP (last 3 results) Recent Labs    04/21/20 1510  PROBNP 100    Lipid Profile: No results for input(s): CHOL, HDL, LDLCALC, TRIG, CHOLHDL, LDLDIRECT in the last 72 hours.  Thyroid Function Tests: No results for input(s): TSH, T4TOTAL, FREET4, T3FREE, THYROIDAB in the last 72 hours.  Anemia Panel: Recent Labs    04/02/21 1037  FERRITIN 124    Urine analysis:    Component Value Date/Time   COLORURINE YELLOW 04/02/2021 0222   APPEARANCEUR CLEAR 04/02/2021 0222   LABSPEC 1.025 04/02/2021 0222   PHURINE 5.5 04/02/2021 0222   GLUCOSEU NEGATIVE 04/02/2021 0222   HGBUR NEGATIVE 04/02/2021 0222   BILIRUBINUR NEGATIVE 04/02/2021 0222   KETONESUR NEGATIVE 04/02/2021 0222   PROTEINUR NEGATIVE 04/02/2021 0222   NITRITE NEGATIVE 04/02/2021 0222   LEUKOCYTESUR TRACE (A) 04/02/2021 0222    Sepsis Labs: Lactic Acid, Venous    Component Value Date/Time   LATICACIDVEN 2.5 ([place]) 04/02/2021 1119    MICROBIOLOGY: Recent Results (from the past 240 hour(s))  Blood Culture (routine x 2)     Status: None (Preliminary result)   Collection Time: 04/02/21  1:40 AM   Specimen: BLOOD LEFT HAND  Result Value Ref Range Status   Specimen Description BLOOD LEFT HAND  Final   Special Requests   Final    BOTTLES DRAWN AEROBIC AND ANAEROBIC Blood Culture results may not be optimal due to an inadequate volume of blood received in culture bottles   Culture   Final    NO GROWTH 1 DAY Performed at [facility], [place] [address], [place], [place] [address]    Report Status PENDING  Incomplete  Blood Culture (routine x 2)     Status: None (Preliminary result)   Collection Time: 04/02/21  1:40 AM   Specimen: BLOOD RIGHT HAND  Result Value Ref Range Status   Specimen Description BLOOD RIGHT HAND  Final   Special Requests   Final    BOTTLES DRAWN AEROBIC AND ANAEROBIC Blood Culture  adequate volume   Culture   Final    NO GROWTH 1 DAY Performed at [facility], [place] [address], [place], [place] [address]    Report Status PENDING  Incomplete  Resp Panel by RT-PCR (Flu A&B, Covid)     Status: Abnormal   Collection Time: 04/02/21  2:06 AM   Specimen: Nasopharyngeal(NP) swabs in vial transport medium  Result Value Ref Range Status   SARS Coronavirus 2 by RT PCR POSITIVE (A) NEGATIVE Final    Comment: (NOTE) SARS-CoV-2 target nucleic acids are DETECTED.  The SARS-CoV-2 RNA is generally detectable in upper respiratory specimens during the acute phase of infection. Positive results are indicative of the presence of the identified virus, but do not rule out bacterial infection or co-infection with other pathogens not detected by the test. Clinical correlation with patient history and other diagnostic information is necessary to determine patient infection status. The expected result is Negative.  Fact Sheet for Patients: EntrepreneurPulse.com.au  Fact Sheet for Healthcare Providers: IncredibleEmployment.be  This test is not yet approved or cleared by the [place] FDA and  has been authorized for detection and/or diagnosis of SARS-CoV-2 by FDA under an Emergency Use Authorization (EUA).  This EUA will remain in effect (meaning this test can be used) for the duration of  the COVID-19 declaration under Section 564(b)(1) of the A ct, 21 U.S.C. section 360bbb-3(b)(1), unless the authorization is terminated or revoked sooner.     Influenza A by PCR NEGATIVE NEGATIVE Final   Influenza B by PCR NEGATIVE NEGATIVE Final    Comment: (NOTE) The Xpert Xpress SARS-CoV-2/FLU/RSV plus assay is intended as an aid in the diagnosis of influenza from Nasopharyngeal swab specimens and should not be used as a sole basis for treatment. Nasal washings and aspirates are unacceptable for Xpert Xpress SARS-CoV-2/FLU/RSV testing.  Fact  Sheet for Patients: EntrepreneurPulse.com.au  Fact Sheet for Healthcare Providers: IncredibleEmployment.be  This test is not yet approved or cleared by the [place] FDA and has been authorized for detection and/or diagnosis of SARS-CoV-2 by FDA under an Emergency Use Authorization (EUA). This EUA will remain in effect (meaning this test can be used) for the duration of the COVID-19 declaration under Section 564(b)(1) of the Act, 21 U.S.C. section 360bbb-3(b)(1), unless the authorization is terminated or revoked.  Performed at [facility], [place] [address] [place], [place] [address]   Urine Culture     Status: Abnormal (Preliminary result)   Collection Time: 04/02/21  2:22 AM   Specimen: In/Out Cath Urine  Result Value Ref Range Status   Specimen Description IN/OUT CATH URINE  Final   Special Requests NONE  Final   Culture (A)  Final    50,000 COLONIES/mL PSEUDOMONAS AERUGINOSA SUSCEPTIBILITIES TO FOLLOW Performed at [facility], [place] [address] [place], [place] [address]    Report Status PENDING  Incomplete    RADIOLOGY STUDIES/RESULTS: CT Head Wo Contrast  Result Date: 04/02/2021 CLINICAL DATA:  Altered mental status EXAM: CT HEAD WITHOUT CONTRAST CT MAXILLOFACIAL WITHOUT CONTRAST TECHNIQUE: Multidetector CT imaging of the head and maxillofacial structures were performed using the standard protocol without intravenous contrast. Multiplanar CT image reconstructions of the maxillofacial structures were also generated. RADIATION DOSE REDUCTION: This exam was performed according to the departmental dose-optimization program which includes automated exposure control, adjustment of the mA and/or kV according to patient size and/or use of iterative reconstruction technique. COMPARISON:  09/11/2020 FINDINGS: CT HEAD FINDINGS Brain: No evidence of acute infarction, hemorrhage, hydrocephalus, extra-axial collection or mass lesion/mass  effect. Mild atrophic changes and chronic white matter ischemic changes are noted. Vascular: No hyperdense vessel or unexpected calcification. Skull: Normal. Negative for fracture or focal lesion. Other:  None. CT MAXILLOFACIAL FINDINGS Osseous: No acute bony abnormality is noted. Orbits: Orbits and their contents are within normal limits. Sinuses: Paranasal sinuses show no mucosal thickening or air-fluid levels. Soft tissues: Surrounding soft tissue structures show no acute abnormality. Specifically the parotid glands are within normal limits bilaterally. IMPRESSION: CT of the head: Chronic atrophic and ischemic changes without acute abnormality. CT of the maxillofacial bones: No acute bony or soft tissue abnormality is noted. Electronically Signed   By: Inez Catalina M.D.   On: 04/02/2021 03:52   CT Angio Chest Pulmonary Embolism (PE) W or WO Contrast  Result Date: 04/02/2021 CLINICAL DATA:  Shortness of breath EXAM: CT ANGIOGRAPHY CHEST WITH CONTRAST TECHNIQUE: Multidetector CT imaging of the chest was performed using the standard protocol during bolus administration of intravenous contrast. Multiplanar CT image reconstructions and MIPs were obtained to evaluate the vascular anatomy. RADIATION DOSE REDUCTION: This exam was performed according to the departmental dose-optimization program which includes automated exposure control, adjustment of the mA and/or kV according to patient size and/or use of iterative reconstruction technique. CONTRAST:  51mL OMNIPAQUE IOHEXOL 350 MG/ML SOLN COMPARISON:  None. FINDINGS: Cardiovascular: Heart size normal. No pericardial effusion. The RV is nondilated. Satisfactory opacification of pulmonary arteries noted, and there is no evidence of pulmonary emboli. Moderate coronary calcifications. Incomplete contrast opacification of the thoracic aorta. No suggestion of dissection, aneurysm or stenosis. 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 Scattered calcified plaque in the distal arch and descending thoracic aorta. 2.1 cm peripherally calcified mid splenic artery aneurysm Mediastinum/Nodes: Hiatal hernia. Subcarinal adenopathy, nodes measure up to 1.3 cm short axis diameter. 1.1 cm right paratracheal lymph node (Im51,Se5) . no hilar adenopathy. Lungs/Pleura: Trace bilateral pleural effusions. Geographic ground-glass opacities in the lung bases and dependent aspects of both lungs may represent atelectasis, edema, or alveolitis. There is some peripheral interstitial thickening in the lung bases, and upper lobes left more than right, suggesting possible interstitial edema. Upper Abdomen: 1 cm low-attenuation lesion in hepatic segment 2, possibly cyst but incompletely characterized. 2.1 cm peripherally calcified aneurysm in the mid splenic artery without evidence of rupture. Hiatal hernia involving the gastric fundus. 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 3.5 cm cystic lesion in the right humeral head, present since at least 08/07/2019. Anterior vertebral endplate spurring at multiple levels in the mid and lower thoracic spine. Review of the MIP images confirms the above findings. IMPRESSION: 1. Negative for acute PE or thoracic aortic dissection. 2. Coronary and Aortic Atherosclerosis (ICD10-170.0). 3. Right paratracheal and subcarinal adenopathy of indeterminate etiology. 4. Dependent alveolar opacities in both lungs may represent, edema versus infiltrate. 5. 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 Visceral aneurysms greater than 2 cm are at increased risk for rupture. 6. Coronary and Aortic Atherosclerosis (ICD10-170.0). Electronically Signed   By: Lucrezia Europe M.D.   On: 04/02/2021 14:59   CT Maxillofacial W Contrast  Result Date: 04/02/2021 CLINICAL DATA:  Altered mental status EXAM: CT HEAD WITHOUT CONTRAST CT MAXILLOFACIAL WITHOUT CONTRAST TECHNIQUE: Multidetector CT imaging of the head and maxillofacial structures  were performed using the standard protocol without intravenous contrast. Multiplanar CT image reconstructions of the maxillofacial structures were also generated. RADIATION DOSE REDUCTION: This exam was performed according to the departmental dose-optimization program which includes automated exposure control, adjustment of the mA and/or kV according to patient size and/or use of iterative reconstruction technique. COMPARISON:  09/11/2020 FINDINGS: CT HEAD FINDINGS Brain: No evidence of acute infarction, hemorrhage, hydrocephalus, extra-axial  collection or mass lesion/mass effect. Mild atrophic changes and chronic white matter ischemic changes are noted. Vascular: No hyperdense vessel or unexpected calcification. Skull: Normal. Negative for fracture or focal lesion. Other: None. CT MAXILLOFACIAL FINDINGS Osseous: No acute bony abnormality is noted. Orbits: Orbits and their contents are within normal limits. Sinuses: Paranasal sinuses show no mucosal thickening or air-fluid levels. Soft tissues: Surrounding soft tissue structures show no acute abnormality. Specifically the parotid glands are within normal limits bilaterally. IMPRESSION: CT of the head: Chronic atrophic and ischemic changes without acute abnormality. CT of the maxillofacial bones: No acute bony or soft tissue abnormality is noted. Electronically Signed   By: Inez Catalina M.D.   On: 04/02/2021 03:52   DG Chest Port 1 View  Result Date: 04/02/2021 CLINICAL DATA:  Possible sepsis EXAM: PORTABLE CHEST 1 VIEW COMPARISON:  01/07/2021 FINDINGS: Cardiac shadow is enlarged but stable. Lungs are well aerated bilaterally. Mild bibasilar atelectasis is seen. No acute bony abnormality is noted. IMPRESSION: Mild bibasilar atelectasis. Electronically Signed   By: Inez Catalina M.D.   On: 04/02/2021 02:34     LOS: 1 day   Oren Binet, MD  [facility]    To contact the attending provider between 7A-7P or the covering provider during after hours  7P-7A, please log into the web site [email or web address] and access using universal [place] password for that web site. If you do not have the password, please call the hospital operator.  04/03/2021, 11:31 AM

## 2021-04-03 NOTE — Evaluation (Signed)
Physical Therapy Evaluation Patient Details Name: [name] [name] MRN: [id number] DOB: 06-24-1940 Today's Date: 04/03/2021  History of Present Illness  81 y.o. male presenting with AMS. Pt incidentally COVID-19+. PMH: Alzheimer's dementia, CAD, PAF on Eliquis, hyperlipidemia, and parotitis  Clinical Impression  Patient admitted with above diagnosis. Patient presents with generalized weakness, impaired balance, impaired functional mobility, decreased activity tolerance, and impaired cognition. Patient requires maxA for bed mobility and min-modA for basic transfers with max cues for sequencing. Patient on 1L O2 [place] on arrival with VSS. Patient will benefit from skilled PT services during acute stay to address listed deficits. Recommend SNF at discharge to maximize functional mobility and decrease burden of care.        Recommendations for follow up therapy are one component of a multi-disciplinary discharge planning process, led by the attending physician.  Recommendations may be updated based on patient status, additional functional criteria and insurance authorization.  Follow Up Recommendations Skilled nursing-short term rehab (<3 hours/day)    Assistance Recommended at Discharge Frequent or constant Supervision/Assistance  Patient can return home with the following  A lot of help with walking and/or transfers;A lot of help with bathing/dressing/bathroom;Assistance with cooking/housework;Assistance with feeding;Direct supervision/assist for financial management;Direct supervision/assist for medications management;Assist for transportation;Help with stairs or ramp for entrance    Equipment Recommendations Other (comment) (defer to post acute rehab)  Recommendations for Other Services       Functional Status Assessment Patient has had a recent decline in their functional status and/or demonstrates limited ability to make significant improvements in function in a reasonable and predictable amount  of time     Precautions / Restrictions Precautions Precautions: Fall;Other (comment) Precaution Comments: monitor O2 (does not wear at baseline) Restrictions Weight Bearing Restrictions: No      Mobility  Bed Mobility Overal bed mobility: Needs Assistance Bed Mobility: Supine to Sit     Supine to sit: Max assist, HOB elevated     General bed mobility comments: With tactile cues, pt able to advance B LE slightly to EOB but required assist to completely scoot EOB and lift trunk with noted stiffness.    Transfers Overall transfer level: Needs assistance Equipment used: Rolling Rona Tomson (2 wheels) Transfers: Sit to/from Stand, Bed to chair/wheelchair/BSC Sit to Stand: Min assist   Step pivot transfers: Mod assist       General transfer comment: minA to power up into standing from elevated bed surface. Cues for hand placement with poor follow through. ModA to complete step pivot transfer for balance and RW management. Able to take pivotal steps with cues for sequencing    Ambulation/Gait                  Stairs            Wheelchair Mobility    Modified Rankin (Stroke Patients Only)       Balance Overall balance assessment: Needs assistance Sitting-balance support: Bilateral upper extremity supported, Feet supported Sitting balance-Leahy Scale: Fair     Standing balance support: Bilateral upper extremity supported, During functional activity Standing balance-Leahy Scale: Poor                               Pertinent Vitals/Pain Pain Assessment Pain Assessment: No/denies pain    Home Living Family/patient expects to be discharged to:: Private residence Living Arrangements: Children Available Help at Discharge: Family;Available PRN/intermittently Type of Home: House Home Access: Level entry  Home Layout: Able to live on main level with bedroom/bathroom;Other (Comment) Home Equipment: Rolling Lyla Jasek (2 wheels);Cane - single  point Additional Comments: home setup from previous admission as pt unable to provide. daughter works as Recruitment consultant per pt    Prior Function Prior Level of Function : Needs assist             Mobility Comments: typically uses RW to ambulate ADLs Comments: able to complete ADLs without assist per chart     Hand Dominance   Dominant Hand: Right    Extremity/Trunk Assessment   Upper Extremity Assessment Upper Extremity Assessment: Defer to OT evaluation    Lower Extremity Assessment Lower Extremity Assessment: Generalized weakness    Cervical / Trunk Assessment Cervical / Trunk Assessment: Normal  Communication   Communication: HOH  Cognition Arousal/Alertness: Awake/alert Behavior During Therapy: Flat affect Overall Cognitive Status: History of cognitive impairments - at baseline                                          General Comments General comments (skin integrity, edema, etc.): VSS on 1L O2 [place]    Exercises     Assessment/Plan    PT Assessment Patient needs continued PT services  PT Problem List Decreased strength;Decreased activity tolerance;Decreased mobility;Decreased coordination;Decreased balance;Decreased cognition;Decreased knowledge of use of DME;Decreased safety awareness;Decreased knowledge of precautions;Cardiopulmonary status limiting activity       PT Treatment Interventions DME instruction;Gait training;Functional mobility training;Therapeutic exercise;Therapeutic activities;Balance training;Patient/family education    PT Goals (Current goals can be found in the Care Plan section)  Acute Rehab PT Goals Patient Stated Goal: did not state PT Goal Formulation: Patient unable to participate in goal setting Time For Goal Achievement: 04/17/21 Potential to Achieve Goals: Fair    Frequency Min 2X/week     Co-evaluation               AM-PAC PT "6 Clicks" Mobility  Outcome Measure Help needed turning from your back to  your side while in a flat bed without using bedrails?: A Lot Help needed moving from lying on your back to sitting on the side of a flat bed without using bedrails?: A Lot Help needed moving to and from a bed to a chair (including a wheelchair)?: A Lot Help needed standing up from a chair using your arms (e.g., wheelchair or bedside chair)?: A Little Help needed to walk in hospital room?: A Lot Help needed climbing 3-5 steps with a railing? : Total 6 Click Score: 12    End of Session Equipment Utilized During Treatment: Gait belt;Oxygen Activity Tolerance: Patient tolerated treatment well Patient left: in chair;with call bell/phone within reach;with chair alarm set;with nursing/sitter in room Nurse Communication: Mobility status PT Visit Diagnosis: Unsteadiness on feet (R26.81);Muscle weakness (generalized) (M62.81);Difficulty in walking, not elsewhere classified (R26.2)    Time: 4562-5638 PT Time Calculation (min) (ACUTE ONLY): 24 min   Charges:   PT Evaluation $PT Eval Moderate Complexity: 1 Mod PT Treatments $Therapeutic Activity: 8-22 mins        Darry Kelnhofer A. Gilford Rile PT, DPT Acute Rehabilitation Services Pager [phone] Office [phone]   Linna Hoff 04/03/2021, 5:02 PM

## 2021-04-03 NOTE — Assessment & Plan Note (Signed)
Incidental finding-outpatient vascular surgery evaluation.

## 2021-04-03 NOTE — Evaluation (Signed)
Occupational Therapy Evaluation Patient Details Name: [name] [name] MRN: [id number] DOB: 01/27/1941 Today's Date: 04/03/2021   History of Present Illness [name] [name] is a 81 y.o. male presenting with AMS. Pt incidentally COVID-19+. PMH: Alzheimer's dementia, CAD, PAF on Eliquis, hyperlipidemia, and parotitis   Clinical Impression   Per chart, pt lives with daughter, typically ambulatory with RW and able to complete ADLs at home. Pt unable to provide further information due to cognition. Pt presents now with deficits in strength, endurance, and cardiopulmonary tolerance. Pt overall heavy Max A for bed mobility with difficulty maintaining sitting balance. Pt able to stand with RW at [place] but unable to follow directions to safely take steps this AM. Pt requires Min A for UB ADLs and up to Total A for LB ADLs. Due to below baseline and need for 24/7 assist to safely complete ADLs, recommend SNF rehab at DC. Will continue to follow and update DC recs as appropriate.  Pt able to maintain SpO2 >90% on RA during activities though questionable desats with forceful coughing. Left on 1 L O2 with sats remaining in low 90s. BP WFL. HR 60s.     Recommendations for follow up therapy are one component of a multi-disciplinary discharge planning process, led by the attending physician.  Recommendations may be updated based on patient status, additional functional criteria and insurance authorization.   Follow Up Recommendations  Skilled nursing-short term rehab (<3 hours/day)    Assistance Recommended at Discharge Frequent or constant Supervision/Assistance  Patient can return home with the following Two people to help with walking and/or transfers;A lot of help with bathing/dressing/bathroom;Assistance with cooking/housework;Help with stairs or ramp for entrance    Functional Status Assessment  Patient has had a recent decline in their functional status and demonstrates the ability to make significant  improvements in function in a reasonable and predictable amount of time.  Equipment Recommendations  None recommended by OT    Recommendations for Other Services       Precautions / Restrictions Precautions Precautions: Fall;Other (comment) Precaution Comments: monitor O2 (does not wear at baseline) Restrictions Weight Bearing Restrictions: No      Mobility Bed Mobility Overal bed mobility: Needs Assistance Bed Mobility: Supine to Sit, Sit to Supine     Supine to sit: Max assist, HOB elevated Sit to supine: Mod assist   General bed mobility comments: With tactile cues, pt able to advance B LE slightly to EOB but required assist to completely scoot EOB and lift trunk with noted stiffness. Pt able to guide trunk back to bed with bedrails, assist for LEs    Transfers Overall transfer level: Needs assistance Equipment used: Rolling walker (2 wheels) Transfers: Sit to/from Stand Sit to Stand: Min assist           General transfer comment: Min A to power up (pt holding to RW) with narrow BOS. unable to sequence taking steps at bedside despite multimodal cues and ultimately sat back EOB      Balance Overall balance assessment: Needs assistance Sitting-balance support: Bilateral upper extremity supported, Feet supported Sitting balance-Leahy Scale: Poor Sitting balance - Comments: L lateral sway, reliant on UE support   Standing balance support: Bilateral upper extremity supported, During functional activity Standing balance-Leahy Scale: Poor                             ADL either performed or assessed with clinical judgement   ADL Overall  ADL's : Needs assistance/impaired Eating/Feeding: Set up;Sitting   Grooming: Supervision/safety;Sitting;Wash/dry face Grooming Details (indicate cue type and reason): cues to attend/initiate Upper Body Bathing: Minimal assistance;Sitting   Lower Body Bathing: Maximal assistance;Sit to/from stand   Upper Body Dressing  : Minimal assistance;Sitting   Lower Body Dressing: Total assistance;Sit to/from stand Lower Body Dressing Details (indicate cue type and reason): to don socks, difficulty with sitting balance     Toileting- Clothing Manipulation and Hygiene: Total assistance;Sit to/from stand;Bed level         General ADL Comments: Exacerbation of cognitive deficits with difficulty following commands and poor motor planning     Vision Baseline Vision/History: 1 Wears glasses Ability to See in Adequate Light: 0 Adequate Patient Visual Report: No change from baseline Vision Assessment?: No apparent visual deficits     Perception     Praxis      Pertinent Vitals/Pain Pain Assessment Pain Assessment: No/denies pain     Hand Dominance Right   Extremity/Trunk Assessment Upper Extremity Assessment Upper Extremity Assessment: Generalized weakness   Lower Extremity Assessment Lower Extremity Assessment: Defer to PT evaluation   Cervical / Trunk Assessment Cervical / Trunk Assessment: Normal   Communication Communication Communication: HOH   Cognition Arousal/Alertness: Awake/alert Behavior During Therapy: Flat affect Overall Cognitive Status: History of cognitive impairments - at baseline                                 General Comments: unsure of why he is at hospital, follows approx 60% of one step commands with increased time. questionable hearing deficits     General Comments  SpO2 >90% on RA with activity, questionable desats with coughing (pt holding breath, foreful cough and turns red). replaced with 1 L O2 with sats remaining in 90s    Exercises     Shoulder Instructions      Home Living Family/patient expects to be discharged to:: Private residence Living Arrangements: Children Available Help at Discharge: Family;Available PRN/intermittently Type of Home: House Home Access: Level entry     Home Layout: Able to live on main level with  bedroom/bathroom;Other (Comment)     Bathroom Shower/Tub: Teacher, early years/pre: Handicapped height     Home Equipment: Conservation officer, nature (2 wheels);Cane - single point   Additional Comments: home setup from previous admission as pt unable to provide. daughter works as bus Geophysicist/field seismologist per pt      Prior Functioning/Environment Prior Level of Function : Needs assist             Mobility Comments: typically uses RW to ambulate ADLs Comments: able to complete ADLs without assist per chart        OT Problem List: Decreased strength;Impaired balance (sitting and/or standing);Decreased activity tolerance;Decreased cognition;Decreased safety awareness      OT Treatment/Interventions: Self-care/ADL training;Therapeutic exercise;Energy conservation;DME and/or AE instruction;Therapeutic activities;Patient/family education;Balance training    OT Goals(Current goals can be found in the care plan section) Acute Rehab OT Goals Patient Stated Goal: lay back down OT Goal Formulation: With patient Time For Goal Achievement: 04/17/21 Potential to Achieve Goals: Good  OT Frequency: Min 2X/week    Co-evaluation              AM-PAC OT "6 Clicks" Daily Activity     Outcome Measure Help from another person eating meals?: A Little Help from another person taking care of personal grooming?: A Little Help from another  person toileting, which includes using toliet, bedpan, or urinal?: Total Help from another person bathing (including washing, rinsing, drying)?: A Lot Help from another person to put on and taking off regular upper body clothing?: A Little Help from another person to put on and taking off regular lower body clothing?: Total 6 Click Score: 13   End of Session Equipment Utilized During Treatment: Rolling walker (2 wheels);Oxygen Nurse Communication: Mobility status  Activity Tolerance: Patient limited by fatigue Patient left: in bed;with call bell/phone within  reach;with bed alarm set  OT Visit Diagnosis: Unsteadiness on feet (R26.81);Other abnormalities of gait and mobility (R26.89);Muscle weakness (generalized) (M62.81);Other symptoms and signs involving cognitive function                Time: 0730-0803 OT Time Calculation (min): 33 min Charges:  OT General Charges $OT Visit: 1 Visit OT Evaluation $OT Eval Moderate Complexity: 1 Mod OT Treatments $Therapeutic Activity: 8-22 mins  Malachy Chamber, OTR/L Acute Rehab Services Office: [phone]   Layla Maw 04/03/2021, 8:20 AM

## 2021-04-03 NOTE — Assessment & Plan Note (Signed)
BP stable on Coreg/lisinopril.  Follow and adjust accordingly.

## 2021-04-03 NOTE — Hospital Course (Addendum)
Patient is a 81 y.o.  male with history of PAF on Eliquis, HFpEF, HTN, dementia-whose daughter was recently diagnosed with COVID-19 infection-started having nasal congestion/URI-like symptoms on the day of admission-then was noted to be confused-he was brought to the ED where he was found to be febrile-mildly hypoxic with tachypnea-further evaluation revealed acute COVID-19 infection.  He was subsequently admitted to the hospitalist service.  See below for further details.    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

## 2021-04-03 NOTE — Assessment & Plan Note (Signed)
No anginal symptoms-on beta-blocker/statin-not on aspirin as on Eliquis.

## 2021-04-04 DIAGNOSIS — I25119 Atherosclerotic heart disease of native coronary artery with unspecified angina pectoris: Secondary | ICD-10-CM

## 2021-04-04 LAB — COMPREHENSIVE METABOLIC PANEL
ALT: 28 U/L (ref 0–44)
AST: 26 U/L (ref 15–41)
Albumin: 2.9 g/dL — ABNORMAL LOW (ref 3.5–5.0)
Alkaline Phosphatase: 47 U/L (ref 38–126)
Anion gap: 10 (ref 5–15)
BUN: 23 mg/dL (ref 8–23)
CO2: 25 mmol/L (ref 22–32)
Calcium: 8.9 mg/dL (ref 8.9–10.3)
Chloride: 103 mmol/L (ref 98–111)
Creatinine, Ser: 1.21 mg/dL (ref 0.61–1.24)
GFR, Estimated: 60 mL/min — ABNORMAL LOW (ref >60)
Glucose, Bld: 135 mg/dL — ABNORMAL HIGH (ref 70–99)
Potassium: 3.7 mmol/L (ref 3.5–5.1)
Sodium: 138 mmol/L (ref 135–145)
Total Bilirubin: 0.5 mg/dL (ref 0.3–1.2)
Total Protein: 6 g/dL — ABNORMAL LOW (ref 6.5–8.1)

## 2021-04-04 LAB — CBC
HCT: 39.2 % (ref 39.0–52.0)
Hemoglobin: 13.2 g/dL (ref 13.0–17.0)
MCH: 31.4 pg (ref 26.0–34.0)
MCHC: 33.7 g/dL (ref 30.0–36.0)
MCV: 93.1 fL (ref 80.0–100.0)
Platelets: 185 10*3/uL (ref 150–400)
RBC: 4.21 MIL/uL — ABNORMAL LOW (ref 4.22–5.81)
RDW: 13.1 % (ref 11.5–15.5)
WBC: 5.6 10*3/uL (ref 4.0–10.5)
nRBC: 0 % (ref 0.0–0.2)

## 2021-04-04 LAB — URINE CULTURE: Culture: 50000 — AB

## 2021-04-04 LAB — MAGNESIUM: Magnesium: 2.2 mg/dL (ref 1.7–2.4)

## 2021-04-04 MED ORDER — ALBUTEROL SULFATE HFA 108 (90 BASE) MCG/ACT IN AERS
2.0000 | INHALATION_SPRAY | Freq: Two times a day (BID) | RESPIRATORY_TRACT | Status: DC
  Administered 2021-04-04 – 2021-04-06 (×4): 2 via RESPIRATORY_TRACT
  Filled 2021-04-04: qty 6.7

## 2021-04-04 NOTE — Progress Notes (Signed)
PROGRESS NOTE        PATIENT DETAILS Name: [name] [name] Age: 81 y.o. Sex: male Date of Birth: 12-25-1940 Admit Date: 04/02/2021 Admitting Physician Norval Morton, MD WLN:[id number], Rebeca Alert, MD  Brief Summary: Patient is a 81 y.o.  male with history of PAF on Eliquis, HFpEF, HTN, dementia-whose daughter was recently diagnosed with COVID-19 infection-started having nasal congestion/URI-like symptoms on the day of admission-then was noted to be confused-he was brought to the ED where he was found to be febrile-mildly hypoxic with tachypnea-further evaluation revealed acute COVID-19 infection.  He was subsequently admitted to the hospitalist service.  See below for further details.    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 Continues to have some cough  Objective: Vitals: Blood pressure 125/77, pulse 73, temperature 97.9 F (36.6 C), temperature source Oral, resp. rate 19, height 5\' 10"  (1.778 m), weight 93.6 kg, SpO2 93 %.   Exam: Gen Exam:not in any distress HEENT:atraumatic, normocephalic Chest: B/L clear to auscultation anteriorly CVS:S1S2 regular Abdomen:soft non tender, non distended Extremities:no edema Neurology: Non focal Skin: no rash   Pertinent Labs/Radiology: CBC Latest Ref Rng & Units 04/04/2021 04/03/2021 04/02/2021  WBC 4.0 - 10.5 K/uL 5.6 5.8 6.6  Hemoglobin 13.0 - 17.0 g/dL 13.2 12.9(L) 13.0  Hematocrit 39.0 - 52.0 % 39.2 39.8 39.5  Platelets 150 - 400 K/uL 185  180 220    Lab Results  Component Value Date   NA 138 04/04/2021   K 3.7 04/04/2021   CL 103 04/04/2021   CO2 25 04/04/2021      Assessment/Plan: * Encephalopathy due to COVID-19 virus- (present on admission) Less confused today-seems to be answering questions appropriately compared to yesterday.CT head without any acute abnormalities.  Continue supportive care.  Maintain delirium precautions.  Continue to treat underlying COVID-19 infection.  SIRS (systemic inflammatory response syndrome) ([place])- (present on admission) SIRS physiology improved-continue to treat underlying COVID-19 infection-on Remdesivir.  Follow cultures.   Acute respiratory distress- (present on admission) Appears to have mild hypoxemia-no obvious PNA/PE on CTA chest.  Encourage incentive spirometry-continue supportive care and attempt to titrate off oxygen.    Chronic diastolic CHF-EF noted to be 60- 65% ( grade 1 diastolic dysfunction by TTE in 04/2020)- (present on admission) Euvolemic on exam-continue home dosing of Lasix.  Paroxysmal atrial fibrillation ([place])- (present on admission) Maintaining sinus rhythm-on metoprolol and Eliquis.   Benign essential hypertension- (present on admission) BP stable on Coreg/lisinopril.  Follow and adjust accordingly.  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  Dementia with behavioral disturbance- (present on admission) Pleasantly confused-maintain delirium precautions-continue Aricept.  Given acute illness/hospitalization-probably will not be at baseline during this hospital stay.  Stage 3a chronic kidney disease ([place])- (present on admission) Close to baseline.  Monitor periodically.  OSA on CPAP  Continue CPAP at night  Splenic artery aneurysm [facility])- (present on admission) Incidental finding-outpatient vascular surgery evaluation.  BMI: Estimated  body mass index is  29.61 kg/m as calculated from the following:   Height as of this encounter: 5\' 10"  (1.778 m).   Weight as of this encounter: 93.6 kg.   Code status:   Code Status: Full Code   DVT Prophylaxis: apixaban (ELIQUIS) tablet 5 mg     Family Communication: [phone] updated on 2/4  Disposition Plan: Status is: Inpatient Remains inpatient appropriate because: COVID 19 infection with hypoxia and encephalopathy-not yet stable for discharge.  PT recommending SNF.    Planned Discharge Destination:Skilled nursing facility   Diet: Diet Order             Diet Heart Room service appropriate? Yes; Fluid consistency: Thin  Diet effective now                     Antimicrobial agents: Anti-infectives (From admission, onward)    Start     Dose/Rate Route Frequency Ordered Stop   04/04/21 0700  nitrofurantoin (macrocrystal-monohydrate) (MACROBID) capsule 100 mg        100 mg Oral Every morning 04/03/21 1128     04/03/21 1000  remdesivir 100 mg in sodium chloride 0.9 % 100 mL IVPB       See Hyperspace for full Linked Orders Report.   100 mg 200 mL/hr over 30 Minutes Intravenous Daily 04/02/21 0431 04/07/21 0959   04/02/21 0500  remdesivir 200 mg in sodium chloride 0.9% 250 mL IVPB       See Hyperspace for full Linked Orders Report.   200 mg 580 mL/hr over 30 Minutes Intravenous Once 04/02/21 0431 04/02/21 0602   04/02/21 0215  piperacillin-tazobactam (ZOSYN) IVPB 3.375 g        3.375 g 100 mL/hr over 30 Minutes Intravenous  Once 04/02/21 0203 04/02/21 0235   04/02/21 0200  metroNIDAZOLE (FLAGYL) IVPB 500 mg  Status:  Discontinued        500 mg 100 mL/hr over 60 Minutes Intravenous  Once 04/02/21 0154 04/02/21 0202   04/02/21 0200  vancomycin (VANCOCIN) IVPB 1000 mg/200 mL premix  Status:  Discontinued        1,000 mg 200 mL/hr over 60 Minutes Intravenous  Once 04/02/21 0154 04/02/21 0157   04/02/21 0200  vancomycin (VANCOREADY) IVPB 2000 mg/400 mL  Status:   Discontinued        2,000 mg 200 mL/hr over 120 Minutes Intravenous  Once 04/02/21 0157 04/02/21 0203        MEDICATIONS: Scheduled Meds:  albuterol  2 puff Inhalation BID   apixaban  5 mg Oral BID   vitamin C  500 mg Oral Daily   carvedilol  3.125 mg Oral BID   donepezil  10 mg Oral QHS   fesoterodine  8 mg Oral QHS   furosemide  20 mg Oral Daily   lisinopril  20 mg Oral QHS   mirabegron ER  50 mg Oral q morning   nitrofurantoin (macrocrystal-monohydrate)  100 mg Oral q AM   rosuvastatin  5 mg Oral QODAY   sodium chloride flush  3 mL Intravenous Q12H   zinc sulfate  220 mg Oral Daily   Continuous Infusions:  remdesivir 100 mg in NS 100 mL 100 mg (04/04/21 0956)   PRN Meds:.acetaminophen **OR** acetaminophen, chlorpheniramine-HYDROcodone, fluticasone, guaiFENesin-dextromethorphan, ondansetron **OR** ondansetron (ZOFRAN) IV   I have personally reviewed following labs and imaging studies  LABORATORY DATA: CBC: Recent Labs  Lab 04/02/21 0154 04/03/21 0106 04/04/21 0217  WBC 6.6 5.8 5.6  NEUTROABS  5.5  --   --   HGB 13.0 12.9* 13.2  HCT 39.5 39.8 39.2  MCV 94.0 94.1 93.1  PLT 220 180 185     Basic Metabolic Panel: Recent Labs  Lab 04/02/21 0154 04/02/21 1037 04/03/21 0106 04/04/21 0217  NA 139  --  139 138  K 4.0  --  3.8 3.7  CL 107  --  105 103  CO2 22  --  22 25  GLUCOSE 164*  --  123* 135*  BUN 18  --  17 23  CREATININE 1.25*  --  1.21 1.21  CALCIUM 10.1  --  9.1 8.9  MG  --  1.8 1.8 2.2  PHOS  --  3.5 3.8  --      GFR: Estimated Creatinine Clearance: 54.1 mL/min (by C-G formula based on SCr of 1.21 mg/dL).  Liver Function Tests: Recent Labs  Lab 04/02/21 0154 04/04/21 0217  AST 29 26  ALT 36 28  ALKPHOS 68 47  BILITOT 1.0 0.5  PROT 6.8 6.0*  ALBUMIN 3.7 2.9*    No results for input(s): LIPASE, AMYLASE in the last 168 hours. No results for input(s): AMMONIA in the last 168 hours.  Coagulation Profile: Recent Labs  Lab  04/02/21 0154  INR 1.1     Cardiac Enzymes: No results for input(s): CKTOTAL, CKMB, CKMBINDEX, TROPONINI in the last 168 hours.  BNP (last 3 results) Recent Labs    04/21/20 1510  PROBNP 100     Lipid Profile: No results for input(s): CHOL, HDL, LDLCALC, TRIG, CHOLHDL, LDLDIRECT in the last 72 hours.  Thyroid Function Tests: No results for input(s): TSH, T4TOTAL, FREET4, T3FREE, THYROIDAB in the last 72 hours.  Anemia Panel: Recent Labs    04/02/21 1037  FERRITIN 124     Urine analysis:    Component Value Date/Time   COLORURINE YELLOW 04/02/2021 0222   APPEARANCEUR CLEAR 04/02/2021 0222   LABSPEC 1.025 04/02/2021 0222   PHURINE 5.5 04/02/2021 0222   GLUCOSEU NEGATIVE 04/02/2021 0222   HGBUR NEGATIVE 04/02/2021 0222   BILIRUBINUR NEGATIVE 04/02/2021 0222   KETONESUR NEGATIVE 04/02/2021 0222   PROTEINUR NEGATIVE 04/02/2021 0222   NITRITE NEGATIVE 04/02/2021 0222   LEUKOCYTESUR TRACE (A) 04/02/2021 0222    Sepsis Labs: Lactic Acid, Venous    Component Value Date/Time   LATICACIDVEN 2.5 ([place]) 04/02/2021 1119    MICROBIOLOGY: Recent Results (from the past 240 hour(s))  Blood Culture (routine x 2)     Status: None (Preliminary result)   Collection Time: 04/02/21  1:40 AM   Specimen: BLOOD LEFT HAND  Result Value Ref Range Status   Specimen Description BLOOD LEFT HAND  Final   Special Requests   Final    BOTTLES DRAWN AEROBIC AND ANAEROBIC Blood Culture results may not be optimal due to an inadequate volume of blood received in culture bottles   Culture   Final    NO GROWTH 2 DAYS Performed at [facility], [place] [address] [place],  [address]    Report Status PENDING  Incomplete  Blood Culture (routine x 2)     Status: None (Preliminary result)   Collection Time: 04/02/21  1:40 AM   Specimen: BLOOD RIGHT HAND  Result Value Ref Range Status   Specimen Description BLOOD RIGHT HAND  Final   Special Requests   Final    BOTTLES DRAWN AEROBIC  AND ANAEROBIC Blood Culture adequate volume   Culture   Final    NO  GROWTH 2 DAYS Performed at [facility], [place] [address], [place], [place] [address]    Report Status PENDING  Incomplete  Resp Panel by RT-PCR (Flu A&B, Covid)     Status: Abnormal   Collection Time: 04/02/21  2:06 AM   Specimen: Nasopharyngeal(NP) swabs in vial transport medium  Result Value Ref Range Status   SARS Coronavirus 2 by RT PCR POSITIVE (A) NEGATIVE Final    Comment: (NOTE) SARS-CoV-2 target nucleic acids are DETECTED.  The SARS-CoV-2 RNA is generally detectable in upper respiratory specimens during the acute phase of infection. Positive results are indicative of the presence of the identified virus, but do not rule out bacterial infection or co-infection with other pathogens not detected by the test. Clinical correlation with patient history and other diagnostic information is necessary to determine patient infection status. The expected result is Negative.  Fact Sheet for Patients: EntrepreneurPulse.com.au  Fact Sheet for Healthcare Providers: IncredibleEmployment.be  This test is not yet approved or cleared by the [place] FDA and  has been authorized for detection and/or diagnosis of SARS-CoV-2 by FDA under an Emergency Use Authorization (EUA).  This EUA will remain in effect (meaning this test can be used) for the duration of  the COVID-19 declaration under Section 564(b)(1) of the A ct, 21 U.S.C. section 360bbb-3(b)(1), unless the authorization is terminated or revoked sooner.     Influenza A by PCR NEGATIVE NEGATIVE Final   Influenza B by PCR NEGATIVE NEGATIVE Final    Comment: (NOTE) The Xpert Xpress SARS-CoV-2/FLU/RSV plus assay is intended as an aid in the diagnosis of influenza from Nasopharyngeal swab specimens and should not be used as a sole basis for treatment. Nasal washings and aspirates are unacceptable for Xpert Xpress  SARS-CoV-2/FLU/RSV testing.  Fact Sheet for Patients: EntrepreneurPulse.com.au  Fact Sheet for Healthcare Providers: IncredibleEmployment.be  This test is not yet approved or cleared by the [place] FDA and has been authorized for detection and/or diagnosis of SARS-CoV-2 by FDA under an Emergency Use Authorization (EUA). This EUA will remain in effect (meaning this test can be used) for the duration of the COVID-19 declaration under Section 564(b)(1) of the Act, 21 U.S.C. section 360bbb-3(b)(1), unless the authorization is terminated or revoked.  Performed at [facility], [place] [address] [place], [place] [address]   Urine Culture     Status: Abnormal   Collection Time: 04/02/21  2:22 AM   Specimen: In/Out Cath Urine  Result Value Ref Range Status   Specimen Description IN/OUT CATH URINE  Final   Special Requests   Final    NONE Performed at [facility], [place] [address] [place], [place] [address]    Culture 50,000 COLONIES/mL PSEUDOMONAS AERUGINOSA (A)  Final   Report Status 04/04/2021 FINAL  Final   Organism ID, Bacteria PSEUDOMONAS AERUGINOSA (A)  Final      Susceptibility   Pseudomonas aeruginosa - MIC*    CEFTAZIDIME <=1 SENSITIVE Sensitive     CIPROFLOXACIN <=0.25 SENSITIVE Sensitive     GENTAMICIN <=1 SENSITIVE Sensitive     IMIPENEM <=0.25 SENSITIVE Sensitive     PIP/TAZO <=4 SENSITIVE Sensitive     CEFEPIME 2 SENSITIVE Sensitive     * 50,000 COLONIES/mL PSEUDOMONAS AERUGINOSA    RADIOLOGY STUDIES/RESULTS: No results found.   LOS: 2 days   Oren Binet, MD  [facility]    To contact the attending provider between 7A-7P or the covering provider during after hours 7P-7A, please log  into the web site [email or web address] and access using universal  password for that web site. If you do not have the password, please call the hospital operator.  04/04/2021, 2:17 PM

## 2021-04-05 ENCOUNTER — Other Ambulatory Visit (HOSPITAL_COMMUNITY): Payer: Self-pay

## 2021-04-05 NOTE — Progress Notes (Signed)
°  Transition of Care [facility]) Screening Note   Patient Details  Name: [name] [name] Date of Birth: [birth year]   Transition of Care [facility]) CM/SW Contact:    Cyndi Bender, RN Phone Number: 04/05/2021, 8:40 AM    Transition of Care Department [facility]) has reviewed patient. TOC consult for medicine assistance. I have requested a benefits check for Eliquis.  We will continue to monitor patient advancement through interdisciplinary progression rounds.

## 2021-04-05 NOTE — TOC Progression Note (Signed)
Transition of Care [facility]) - Progression Note    Patient Details  Name: [name] [name] MRN: [id number] Date of Birth: 1940-04-02  Transition of Care [facility]) CM/SW [place], LCSW Phone Number: 04/05/2021, 12:13 PM  Clinical Narrative:    CSW received call from patient's daughter confirming that family is prepared to take patient home at discharge. She is requesting a hospital bed; RNCM to order.    Expected Discharge Plan: [place] Barriers to Discharge: Continued Medical Work up  Expected Discharge Plan and Services Expected Discharge Plan: [place] In-house Referral: Clinical Social Work   Post Acute Care Choice: [place] arrangements for the past 2 months: Single Family Home                                       Social Determinants of Health (SDOH) Interventions    Readmission Risk Interventions No flowsheet data found.

## 2021-04-05 NOTE — Plan of Care (Signed)

## 2021-04-05 NOTE — TOC Progression Note (Addendum)
Transition of Care [facility]) - Progression Note    Patient Details  Name: [name] [name] MRN: [id number] Date of Birth: 01/10/1941  Transition of Care [facility]) CM/SW [place], RN Phone Number: 04/05/2021, 12:46 PM  Clinical Narrative:    Damaris Schooner to daughter, Elzie Rings, regarding transition needs. Daughter would like to take patient home with home health and hospital bed. Choice offered. Daughter defers to [facility] to find an agency with high ratings. Orders have been placed. Spoke to [place] with [facility] and referral accepted for Hh-PT/OT/aide. Spoke to [place] with [facility] and hospital bed ordered. Pharmacy did a benefit check for the Eliquis. There is a copay of $45. The daughter says they can afford that. Address and Phone number verified. Daughter is the contact person TOC will continue to follow.   Expected Discharge Plan: [place] Barriers to Discharge: Continued Medical Work up  Expected Discharge Plan and Services Expected Discharge Plan: [place] In-house Referral: Clinical Social Work Discharge Planning Services: CM Consult Post Acute Care Choice: Durable Medical Equipment, Home Health Living arrangements for the past 2 months: [place]                 DME Arranged: Hospital bed DME Agency: [facility] Date DME Agency Contacted: 04/05/21 Time DME Agency Contacted: 3005 Representative spoke with at DME Agency: Freda Munro HH Arranged: PT, OT, Nurse's Aide [place] Agency: [place] Date [place]: 04/05/21 Time Sun: 1245 Representative spoke with at [place]: [place] ([place]) Interventions    Readmission Risk Interventions No flowsheet data found.

## 2021-04-05 NOTE — TOC Initial Note (Signed)
Transition of Care [facility]) - Initial/Assessment Note    Patient Details  Name: [name] [name] MRN: [id number] Date of Birth: 1940/08/24  Transition of Care [facility]) CM/SW Contact:    Benard [place], LCSW Phone Number: 04/05/2021, 9:18 AM  Clinical Narrative:                 CSW received consult for possible SNF at time of discharge. CSW spoke with patient's daughter, [name] [name]. CSW discussed SNF recommendation for patient due to weakness. She reported that she would like home health services instead of SNF. She stated she is familiar with home health and is aware that they only come in a few times a week and feels comfortable taking care of patient. Patient was going to outpatient PT prior to hospitalization. She reported no preference but a home health agency with high ratings. CSW discussed equipment needs with patient and he currently has a rolling walker, cane, and bedside commode. Patient was not on oxygen at home. CSW confirmed PCP and address (patient lives with [name] [name] and son is next door). Daughter expressed concern that patient needed to be sitting straight up when eating, that his food was being delivered cold, and that patient requires assistance ordering and opening food. CSW passed concerns on to RN. TOC will continue to follow.    Expected Discharge Plan: [place] Barriers to Discharge: Continued Medical Work up   Patient Goals and CMS Choice Patient states their goals for this hospitalization and ongoing recovery are:: Return home CMS Medicare.gov Compare Post Acute Care list provided to:: Patient Represenative (must comment) Choice offered to / list presented to : Adult Children  Expected Discharge Plan and Services Expected Discharge Plan: [place] In-house Referral: Clinical Social Work   Post Acute Care Choice: [place] arrangements for the past 2 months: [place]                                       Prior Living Arrangements/Services Living arrangements for the past 2 months: Single Family Home Lives with:: Adult Children (Daughter) Patient language and need for interpreter reviewed:: Yes Do you feel safe going back to the place where you live?: Yes      Need for Family Participation in Patient Care: Yes (Comment) Care giver support system in place?: Yes (comment) Current home services: DME Criminal Activity/Legal Involvement Pertinent to Current Situation/Hospitalization: No - Comment as needed  Activities of Daily Living      Permission Sought/Granted Permission sought to share information with : Facility Sport and exercise psychologist, Family Supports Permission granted to share information with : Yes, Verbal Permission Granted  Share Information with NAME: [name] [name]  Permission granted to share info w AGENCY: [place] granted to share info w Relationship: Daugher  Permission granted to share info w Contact Information: [phone]  Emotional Assessment   Attitude/Demeanor/Rapport: Unable to Assess Affect (typically observed): Unable to Assess Orientation: :  (WDL, Dementia) Alcohol / Substance Use: Not Applicable Psych Involvement: No (comment)  Admission diagnosis:  Delirium [R41.0] COVID-19 virus infection [U07.1] Encephalopathy due to COVID-19 virus [U07.1, G93.49] COVID-19 [U07.1] Patient Active Problem List   Diagnosis Date Noted   Splenic artery aneurysm ([place]) 04/03/2021   Lactic acidosis 04/02/2021   Paroxysmal atrial fibrillation ([place]) 04/02/2021   SIRS (systemic inflammatory response syndrome) ([place]) 04/02/2021   Acute respiratory distress 04/02/2021  Dementia with behavioral disturbance 04/02/2021   Sepsis ([place]) 01/08/2021   Neck infection 01/08/2021   Encephalopathy due to COVID-19 virus 01/08/2021   Transaminitis 01/08/2021   Sepsis secondary to UTI ([place]) 09/11/2020   Dysuria 08/10/2020   Acute parotitis 07/27/2020   Facial cellulitis  07/26/2020   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   Primary osteoarthritis of left hip 09/06/2018   Alzheimer's dementia ([place]) 12/07/2016   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   Venous stasis dermatitis of left lower extremity 04/07/2016   Bilateral impacted cerumen 01/18/2016   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   Unsteadiness 01/18/2016   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   Cellulitis of left lower leg    Left leg swelling    Cellulitis 08/23/2014   Chronic diastolic CHF-EF noted to be 60- 65% ( grade 1 diastolic dysfunction by TTE in 04/2020) 04/29/2014   DOE (dyspnea on exertion) 11/12/2011   Stage 3a chronic kidney disease (HCC)    Thrombocytopenia, unspecified ([place]) 07/29/2010   Vitamin D deficiency 05/19/2010   Depression, major, recurrent, 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   Urinary urgency 05/19/2010   Vertigo, 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   Benign essential hypertension 12/23/2009   Actinic keratosis 12/23/2009   Seborrheic keratosis 03/18/2009   Benign neoplasm 03/18/2009   Dyslipidemia 01/18/2008   OSA on CPAP 01/18/2008   Coronary atherosclerosis 01/18/2008   Atherosclerotic heart disease of native coronary artery without angina pectoris 01/18/2008   Cataract in degenerative disorder 11/21/2006   Unspecified protein-calorie malnutrition ([place]) 09/27/2006   Gastro-esophageal reflux disease with esophagitis 09/15/2006   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

## 2021-04-05 NOTE — Progress Notes (Signed)
PROGRESS NOTE        PATIENT DETAILS Name: [name] [name] Age: 81 y.o. Sex: male Date of Birth: [birth year] Admit Date: 04/02/2021 Admitting Physician Norval Morton, MD DPO:[id number], Rebeca Alert, MD  Brief Summary: Patient is a 81 y.o.  male with history of PAF on Eliquis, HFpEF, HTN, dementia-whose daughter was recently diagnosed with COVID-19 infection-started having nasal congestion/URI-like symptoms on the day of admission-then was noted to be confused-he was brought to the ED where he was found to be febrile-mildly hypoxic with tachypnea-further evaluation revealed acute COVID-19 infection.  He was subsequently admitted to the hospitalist service.  See below for further details.    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  No chest pain or shortness of breath.  Objective: Vitals: Blood pressure 120/75, pulse 77, temperature 98.4 F (36.9 C), temperature source Oral, resp. rate 19, height 5\' 10"  (1.778 m), weight 97.9 kg, SpO2 97 %.   Exam: Gen Exam:Alert awake-not in any distress HEENT:atraumatic, normocephalic Chest: B/L clear to auscultation anteriorly CVS:S1S2 regular Abdomen:soft non tender, non distended Extremities:no edema Neurology: Non focal Skin: no rash   Pertinent Labs/Radiology: CBC Latest Ref Rng & Units 04/04/2021 04/03/2021 04/02/2021  WBC 4.0 - 10.5 K/uL 5.6 5.8 6.6  Hemoglobin 13.0 - 17.0 g/dL 13.2 12.9(L) 13.0  Hematocrit 39.0 - 52.0 % 39.2 39.8 39.5  Platelets  150 - 400 K/uL 185 180 220    Lab Results  Component Value Date   NA 138 04/04/2021   K 3.7 04/04/2021   CL 103 04/04/2021   CO2 25 04/04/2021      Assessment/Plan: * Encephalopathy due to COVID-19 virus- (present on admission) Much more awake and alert compared to the past few days-CT head without any acute abnormalities.  Completed a course of Remdesivir.  SIRS (systemic inflammatory response syndrome) ([place])- (present on admission) SIRS physiology improved-continue to treat underlying COVID-19 infection-cultures negative so far-has completed a course of Remdesivir.  Acute respiratory distress- (present on admission) Minimal hypoxemia-either on 1-2 L on room air-no PNA/PE on CTA chest.  Continue supportive care.  Chronic diastolic CHF-EF noted to be 60- 65% ( grade 1 diastolic dysfunction by TTE in 04/2020)- (present on admission) Euvolemic on exam-continue home dosing of Lasix.  Paroxysmal atrial fibrillation ([place])- (present on admission) Maintaining sinus rhythm-on metoprolol and Eliquis.   Benign essential hypertension- (present on admission) BP stable on Coreg/lisinopril.  Follow and adjust accordingly.  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  Dementia with behavioral disturbance- (present on admission) Pleasantly confused-maintain delirium precautions-continue Aricept.  Given acute illness/hospitalization-probably will not be at baseline during this hospital stay.  Stage 3a chronic kidney disease ([place])- (present on admission) Close to baseline.  Monitor periodically.  OSA on CPAP  Continue CPAP at night  Splenic artery aneurysm [facility])- (present on admission) Incidental finding-outpatient vascular surgery evaluation.  BMI: Estimated body mass index is 30.97 kg/m as calculated from the following:  Height as of this encounter: 5\' 10"  (1.778 m).   Weight as of this  encounter: 97.9 kg.   Code status:   Code Status: Full Code   DVT Prophylaxis: apixaban (ELIQUIS) tablet 5 mg     Family Communication: Daughter-[name]-[phone] updated on 2/6  Disposition Plan: Status is: Inpatient Remains inpatient appropriate because: Monitor for Monday-social worker/case management discussing with family regarding appropriate disposition.  PT recommending SNF, but patient/family leaning towards home health.    Planned Discharge Destination:Skilled nursing facility with home health services.   Diet: Diet Order             Diet Heart Room service appropriate? Yes; Fluid consistency: Thin  Diet effective now                     Antimicrobial agents: Anti-infectives (From admission, onward)    Start     Dose/Rate Route Frequency Ordered Stop   04/04/21 0700  nitrofurantoin (macrocrystal-monohydrate) (MACROBID) capsule 100 mg        100 mg Oral Every morning 04/03/21 1128     04/03/21 1000  remdesivir 100 mg in sodium chloride 0.9 % 100 mL IVPB  Status:  Discontinued       See Hyperspace for full Linked Orders Report.   100 mg 200 mL/hr over 30 Minutes Intravenous Daily 04/02/21 0431 04/05/21 1048   04/02/21 0500  remdesivir 200 mg in sodium chloride 0.9% 250 mL IVPB       See Hyperspace for full Linked Orders Report.   200 mg 580 mL/hr over 30 Minutes Intravenous Once 04/02/21 0431 04/02/21 0602   04/02/21 0215  piperacillin-tazobactam (ZOSYN) IVPB 3.375 g        3.375 g 100 mL/hr over 30 Minutes Intravenous  Once 04/02/21 0203 04/02/21 0235   04/02/21 0200  metroNIDAZOLE (FLAGYL) IVPB 500 mg  Status:  Discontinued        500 mg 100 mL/hr over 60 Minutes Intravenous  Once 04/02/21 0154 04/02/21 0202   04/02/21 0200  vancomycin (VANCOCIN) IVPB 1000 mg/200 mL premix  Status:  Discontinued        1,000 mg 200 mL/hr over 60 Minutes Intravenous  Once 04/02/21 0154 04/02/21 0157   04/02/21 0200  vancomycin (VANCOREADY) IVPB 2000 mg/400 mL   Status:  Discontinued        2,000 mg 200 mL/hr over 120 Minutes Intravenous  Once 04/02/21 0157 04/02/21 0203        MEDICATIONS: Scheduled Meds:  albuterol  2 puff Inhalation BID   apixaban  5 mg Oral BID   vitamin C  500 mg Oral Daily   carvedilol  3.125 mg Oral BID   donepezil  10 mg Oral QHS   fesoterodine  8 mg Oral QHS   furosemide  20 mg Oral Daily   lisinopril  20 mg Oral QHS   mirabegron ER  50 mg Oral q morning   nitrofurantoin (macrocrystal-monohydrate)  100 mg Oral q AM   rosuvastatin  5 mg Oral QODAY   sodium chloride flush  3 mL Intravenous Q12H   zinc sulfate  220 mg Oral Daily   Continuous Infusions:   PRN Meds:.acetaminophen **OR** acetaminophen, chlorpheniramine-HYDROcodone, fluticasone, guaiFENesin-dextromethorphan, ondansetron **OR** ondansetron (ZOFRAN) IV   I have personally reviewed following labs and imaging studies  LABORATORY DATA: CBC: Recent Labs  Lab 04/02/21 0154 04/03/21 0106 04/04/21 0217  WBC 6.6 5.8 5.6  NEUTROABS 5.5  --   --   HGB 13.0 12.9*  13.2  HCT 39.5 39.8 39.2  MCV 94.0 94.1 93.1  PLT 220 180 956    Basic Metabolic Panel: Recent Labs  Lab 04/02/21 0154 04/02/21 1037 04/03/21 0106 04/04/21 0217  NA 139  --  139 138  K 4.0  --  3.8 3.7  CL 107  --  105 103  CO2 22  --  22 25  GLUCOSE 164*  --  123* 135*  BUN 18  --  17 23  CREATININE 1.25*  --  1.21 1.21  CALCIUM 10.1  --  9.1 8.9  MG  --  1.8 1.8 2.2  PHOS  --  3.5 3.8  --     GFR: Estimated Creatinine Clearance: 55.3 mL/min (by C-G formula based on SCr of 1.21 mg/dL).  Liver Function Tests: Recent Labs  Lab 04/02/21 0154 04/04/21 0217  AST 29 26  ALT 36 28  ALKPHOS 68 47  BILITOT 1.0 0.5  PROT 6.8 6.0*  ALBUMIN 3.7 2.9*   No results for input(s): LIPASE, AMYLASE in the last 168 hours. No results for input(s): AMMONIA in the last 168 hours.  Coagulation Profile: Recent Labs  Lab 04/02/21 0154  INR 1.1    Cardiac Enzymes: No results  for input(s): CKTOTAL, CKMB, CKMBINDEX, TROPONINI in the last 168 hours.  BNP (last 3 results) Recent Labs    04/21/20 1510  PROBNP 100    Lipid Profile: No results for input(s): CHOL, HDL, LDLCALC, TRIG, CHOLHDL, LDLDIRECT in the last 72 hours.  Thyroid Function Tests: No results for input(s): TSH, T4TOTAL, FREET4, T3FREE, THYROIDAB in the last 72 hours.  Anemia Panel: No results for input(s): VITAMINB12, FOLATE, FERRITIN, TIBC, IRON, RETICCTPCT in the last 72 hours.   Urine analysis:    Component Value Date/Time   COLORURINE YELLOW 04/02/2021 0222   APPEARANCEUR CLEAR 04/02/2021 0222   LABSPEC 1.025 04/02/2021 0222   PHURINE 5.5 04/02/2021 0222   GLUCOSEU NEGATIVE 04/02/2021 0222   HGBUR NEGATIVE 04/02/2021 0222   BILIRUBINUR NEGATIVE 04/02/2021 0222   KETONESUR NEGATIVE 04/02/2021 0222   PROTEINUR NEGATIVE 04/02/2021 0222   NITRITE NEGATIVE 04/02/2021 0222   LEUKOCYTESUR TRACE (A) 04/02/2021 0222    Sepsis Labs: Lactic Acid, Venous    Component Value Date/Time   LATICACIDVEN 2.5 ([place]) 04/02/2021 1119    MICROBIOLOGY: Recent Results (from the past 240 hour(s))  Blood Culture (routine x 2)     Status: None (Preliminary result)   Collection Time: 04/02/21  1:40 AM   Specimen: BLOOD LEFT HAND  Result Value Ref Range Status   Specimen Description BLOOD LEFT HAND  Final   Special Requests   Final    BOTTLES DRAWN AEROBIC AND ANAEROBIC Blood Culture results may not be optimal due to an inadequate volume of blood received in culture bottles   Culture   Final    NO GROWTH 3 DAYS Performed at [facility], [place] [address] [place], [place] [address]    Report Status PENDING  Incomplete  Blood Culture (routine x 2)     Status: None (Preliminary result)   Collection Time: 04/02/21  1:40 AM   Specimen: BLOOD RIGHT HAND  Result Value Ref Range Status   Specimen Description BLOOD RIGHT HAND  Final   Special Requests   Final    BOTTLES DRAWN AEROBIC AND ANAEROBIC  Blood Culture adequate volume   Culture   Final    NO GROWTH 3 DAYS Performed at [facility], [place] [address]  [place], [place] [address]    Report Status PENDING  Incomplete  Resp Panel by RT-PCR (Flu A&B, Covid)     Status: Abnormal   Collection Time: 04/02/21  2:06 AM   Specimen: Nasopharyngeal(NP) swabs in vial transport medium  Result Value Ref Range Status   SARS Coronavirus 2 by RT PCR POSITIVE (A) NEGATIVE Final    Comment: (NOTE) SARS-CoV-2 target nucleic acids are DETECTED.  The SARS-CoV-2 RNA is generally detectable in upper respiratory specimens during the acute phase of infection. Positive results are indicative of the presence of the identified virus, but do not rule out bacterial infection or co-infection with other pathogens not detected by the test. Clinical correlation with patient history and other diagnostic information is necessary to determine patient infection status. The expected result is Negative.  Fact Sheet for Patients: EntrepreneurPulse.com.au  Fact Sheet for Healthcare Providers: IncredibleEmployment.be  This test is not yet approved or cleared by the [place] FDA and  has been authorized for detection and/or diagnosis of SARS-CoV-2 by FDA under an Emergency Use Authorization (EUA).  This EUA will remain in effect (meaning this test can be used) for the duration of  the COVID-19 declaration under Section 564(b)(1) of the A ct, 21 U.S.C. section 360bbb-3(b)(1), unless the authorization is terminated or revoked sooner.     Influenza A by PCR NEGATIVE NEGATIVE Final   Influenza B by PCR NEGATIVE NEGATIVE Final    Comment: (NOTE) The Xpert Xpress SARS-CoV-2/FLU/RSV plus assay is intended as an aid in the diagnosis of influenza from Nasopharyngeal swab specimens and should not be used as a sole basis for treatment. Nasal washings and aspirates are unacceptable for Xpert Xpress  SARS-CoV-2/FLU/RSV testing.  Fact Sheet for Patients: EntrepreneurPulse.com.au  Fact Sheet for Healthcare Providers: IncredibleEmployment.be  This test is not yet approved or cleared by the [place] FDA and has been authorized for detection and/or diagnosis of SARS-CoV-2 by FDA under an Emergency Use Authorization (EUA). This EUA will remain in effect (meaning this test can be used) for the duration of the COVID-19 declaration under Section 564(b)(1) of the Act, 21 U.S.C. section 360bbb-3(b)(1), unless the authorization is terminated or revoked.  Performed at [facility], [place] [address] [place],  [address]   Urine Culture     Status: Abnormal   Collection Time: 04/02/21  2:22 AM   Specimen: In/Out Cath Urine  Result Value Ref Range Status   Specimen Description IN/OUT CATH URINE  Final   Special Requests   Final    NONE Performed at [facility], [place] [address], [place], [place] [address]    Culture 50,000 COLONIES/mL PSEUDOMONAS AERUGINOSA (A)  Final   Report Status 04/04/2021 FINAL  Final   Organism ID, Bacteria PSEUDOMONAS AERUGINOSA (A)  Final      Susceptibility   Pseudomonas aeruginosa - MIC*    CEFTAZIDIME <=1 SENSITIVE Sensitive     CIPROFLOXACIN <=0.25 SENSITIVE Sensitive     GENTAMICIN <=1 SENSITIVE Sensitive     IMIPENEM <=0.25 SENSITIVE Sensitive     PIP/TAZO <=4 SENSITIVE Sensitive     CEFEPIME 2 SENSITIVE Sensitive     * 50,000 COLONIES/mL PSEUDOMONAS AERUGINOSA    RADIOLOGY STUDIES/RESULTS: No results found.   LOS: 3 days   Oren Binet, MD  [facility]    To contact the attending provider between 7A-7P or the covering provider during after hours 7P-7A, please log into the web site [email or web address] and access using universal [place] password for  that web site. If you do not have the password, please call the hospital operator.  04/05/2021, 2:30 PM

## 2021-04-05 NOTE — Progress Notes (Signed)
° °  Durable Medical Equipment (From admission, onward)        Start     Ordered  04/05/21 1239  For home use only DME Hospital bed  Once      Question Answer Comment Length of Need Lifetime  Patient has (list medical condition): dementia, CHF, covid,  The above medical condition requires: Patient requires the ability to reposition frequently  Bed type Semi-electric    04/05/21 1240

## 2021-04-05 NOTE — Progress Notes (Signed)
Occupational Therapy Treatment Patient Details Name: [name] [name] MRN: [id number] DOB: 10/04/1940 Today's Date: 04/05/2021   History of present illness 81 y.o. male presenting with AMS. Pt incidentally COVID-19+. PMH: Alzheimer's dementia, CAD, PAF on Eliquis, hyperlipidemia, and parotitis   OT comments  Patient making good gains with OT treatment. Patient able to sit on EOB and perform self care tasks with verbal cues for sequencing and assistance for LB due to difficulty reaching feet. Patient required verbal cues for safety and hand placement with transfers and assistance for correct RW use. Acute OT to continue to follow.    Recommendations for follow up therapy are one component of a multi-disciplinary discharge planning process, led by the attending physician.  Recommendations may be updated based on patient status, additional functional criteria and insurance authorization.    Follow Up Recommendations  Skilled nursing-short term rehab (<3 hours/day)    Assistance Recommended at Discharge Frequent or constant Supervision/Assistance  Patient can return home with the following  A little help with walking and/or transfers;A little help with bathing/dressing/bathroom;Assistance with cooking/housework;Direct supervision/assist for medications management;Direct supervision/assist for financial management;Assist for transportation;Help with stairs or ramp for entrance   Equipment Recommendations  None recommended by OT    Recommendations for Other Services      Precautions / Restrictions Precautions Precautions: Fall;Other (comment) Precaution Comments: monitor O2 (does not wear at baseline)       Mobility Bed Mobility Overal bed mobility: Needs Assistance             General bed mobility comments: seated on EOB upon arrival    Transfers Overall transfer level: Needs assistance Equipment used: Rolling walker (2 wheels) Transfers: Sit to/from Stand, Bed to  chair/wheelchair/BSC Sit to Stand: Min assist     Step pivot transfers: Min assist     General transfer comment: required assistance with guiding walker and verbal cues to use correctly     Balance Overall balance assessment: Needs assistance Sitting-balance support: Bilateral upper extremity supported, Feet supported Sitting balance-Leahy Scale: Fair Sitting balance - Comments: able to perform self care tasks seated on EOB   Standing balance support: Bilateral upper extremity supported, During functional activity Standing balance-Leahy Scale: Poor Standing balance comment: reliant on RW for support                           ADL either performed or assessed with clinical judgement   ADL Overall ADL's : Needs assistance/impaired     Grooming: Wash/dry hands;Wash/dry face;Oral care;Set up;Sitting Grooming Details (indicate cue type and reason): performed sitting on EOB Upper Body Bathing: Minimal assistance;Sitting   Lower Body Bathing: Moderate assistance;Sit to/from stand Lower Body Bathing Details (indicate cue type and reason): required assistance for feet Upper Body Dressing : Minimal assistance;Sitting Upper Body Dressing Details (indicate cue type and reason): changed gown Lower Body Dressing: Maximal assistance Lower Body Dressing Details (indicate cue type and reason): uanble to doff or donn socks               General ADL Comments: followed commands with verbal cues needed for safety with lines and equipment    Extremity/Trunk Assessment              Vision       Perception     Praxis      Cognition Arousal/Alertness: Awake/alert Behavior During Therapy: Flat affect Overall Cognitive Status: History of cognitive impairments - at baseline  General Comments: talkative and followed directions well        Exercises      Shoulder Instructions       General Comments SpO2 at 97 on room  air    Pertinent Vitals/ Pain       Pain Assessment Pain Assessment: No/denies pain  Home Living                                          Prior Functioning/Environment              Frequency  Min 2X/week        Progress Toward Goals  OT Goals(current goals can now be found in the care plan section)  Progress towards OT goals: Progressing toward goals  Acute Rehab OT Goals Patient Stated Goal: go home OT Goal Formulation: With patient Time For Goal Achievement: 04/17/21 Potential to Achieve Goals: Good ADL Goals Pt Will Perform Grooming: with min guard assist;standing Pt Will Perform Lower Body Bathing: with min assist;sitting/lateral leans;sit to/from stand Pt Will Transfer to Toilet: with min guard assist;stand pivot transfer;bedside commode Pt Will Perform Toileting - Clothing Manipulation and hygiene: with min assist;sit to/from stand;sitting/lateral leans  Plan Discharge plan remains appropriate    Co-evaluation                 AM-PAC OT "6 Clicks" Daily Activity     Outcome Measure   Help from another person eating meals?: A Little Help from another person taking care of personal grooming?: A Little Help from another person toileting, which includes using toliet, bedpan, or urinal?: Total Help from another person bathing (including washing, rinsing, drying)?: A Lot Help from another person to put on and taking off regular upper body clothing?: A Little Help from another person to put on and taking off regular lower body clothing?: A Lot 6 Click Score: 14    End of Session Equipment Utilized During Treatment: Rolling walker (2 wheels);Oxygen  OT Visit Diagnosis: Unsteadiness on feet (R26.81);Other abnormalities of gait and mobility (R26.89);Muscle weakness (generalized) (M62.81);Other symptoms and signs involving cognitive function   Activity Tolerance Patient tolerated treatment well   Patient Left in chair;with call bell/phone  within reach;with chair alarm set   Nurse Communication Mobility status        Time: 5784-6962 OT Time Calculation (min): 35 min  Charges: OT General Charges $OT Visit: 1 Visit OT Treatments $Self Care/Home Management : 23-37 mins  Lodema Hong, [place]  Pager [phone] Office [place] 04/05/2021, 10:49 AM

## 2021-04-06 DIAGNOSIS — R8271 Bacteriuria: Secondary | ICD-10-CM

## 2021-04-06 NOTE — TOC Transition Note (Addendum)
Transition of Care [facility]) - CM/SW Discharge Note   Patient Details  Name: [name] [name] MRN: [id number] Date of Birth: [birth year]  [facility] CM/SW Contact:  Carles Collet, RN Phone Number: 04/06/2021, 10:18 AM   Clinical Narrative:    Kaylyn Layer of DC today.  Spoke w daughter and she verified that patient can return home prior to bed delivery.  His son will pick him up on his way home from work around [place] 30 day card provided to patient Bedside nurse updated.       Barriers to Discharge: Continued Medical Work up   Patient Goals and CMS Choice Patient states their goals for this hospitalization and ongoing recovery are:: Return home CMS Medicare.gov Compare Post Acute Care list provided to:: Patient Represenative (must comment) Choice offered to / list presented to : Adult Children  Discharge Placement                       Discharge Plan and Services In-house Referral: Clinical Social Work Discharge Planning Services: CM Consult Post Acute Care Choice: Durable Medical Equipment, Home Health          DME Arranged: Hospital bed DME Agency: [employer] Date DME Agency Contacted: 04/05/21 Time DME Agency Contacted: [phone] Representative spoke with at DME Agency: Freda Munro HH Arranged: PT, OT, Nurse's Aide [place] Agency: [place] Date [place]: 04/05/21 Time [place]: 1245 Representative spoke with at [place]: [place] ([place]) Interventions     Readmission Risk Interventions No flowsheet data found.

## 2021-04-06 NOTE — Discharge Summary (Signed)
PATIENT DETAILS Name: [name] [name] Age: 81 y.o. Sex: male Date of Birth: [birth year] MRN: [id number]. Admitting Physician: Norval Morton, MD WHQ:PRFFMB, Rebeca Alert, MD  Admit Date: 04/02/2021 Discharge date: 04/06/2021  Recommendations for Outpatient Follow-up:  Follow up with PCP in 1-2 weeks Please obtain CMP/CBC in one week  Admitted From:  Home  Disposition: Home health (family refused SNF)   Discharge Condition: good  CODE STATUS:   Code Status: Full Code   Diet recommendation:  Diet Order             Diet - low sodium heart healthy           Diet Heart Room service appropriate? Yes; Fluid consistency: Thin  Diet effective now                    Brief Summary: Patient is a 81 y.o.  male with history of PAF on Eliquis, HFpEF, HTN, dementia-whose daughter was recently diagnosed with COVID-19 infection-started having nasal congestion/URI-like symptoms on the day of admission-then was noted to be confused-he was brought to the ED where he was found to be febrile-mildly hypoxic with tachypnea-further evaluation revealed acute COVID-19 infection.  He was subsequently admitted to the hospitalist service.  See below for further details.    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  Completed a course of Remdesivir.  SIRS  (systemic inflammatory response syndrome) ([place])- (present on admission) SIRS physiology improved-continue to treat underlying COVID-19 infection-cultures negative so far-has completed a course of Remdesivir.  Acute respiratory distress- (present on admission) Had minimal hypoxia on initial presentation-requiring 1-2 L-has been transitioned to room air.  No PNA/PE on CTA chest.  Continue supportive care.  Chronic diastolic CHF-EF noted to be 60- 65% ( grade 1 diastolic dysfunction by TTE in 04/2020)- (present on admission) Euvolemic on exam-continue home dosing of Lasix.  Paroxysmal atrial fibrillation ([place])- (present on admission) Maintaining sinus rhythm-on metoprolol and Eliquis.   Benign essential hypertension- (present on admission) BP stable on Coreg/lisinopril.  Follow and adjust accordingly.  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  Dementia with behavioral disturbance- (present on admission) Mental status is improved-minimally confused at best-continue Aricept on discharge.  Delirium precautions were maintained.  Follow-up with primary care practitioner.   Stage 3a chronic kidney disease ([place])- (present on admission) Close to baseline.  Monitor periodically.  OSA on CPAP  Continue CPAP at night  Splenic artery aneurysm [facility])- (present on admission) Incidental finding-outpatient vascular surgery evaluation.  Asymptomatic bacteriuria- (present on admission) Likely colonization or asymptomatic bacteriuria-has no symptoms of UTI-he is presenting symptoms were consistent with COVID-19 infection-and has improved without initiation of any antibiotics.  Do not think this needs to be treated.  Patient is on chronic suppressive Macrobid therapy.   Obesity: Estimated body mass index is 30.97 kg/m as calculated from the following:   Height as of this  encounter: 5\' 10"  (1.778 m).    Weight as of this encounter: 97.9 kg.   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 )   Stage 3a chronic kidney disease ([place])   Dementia with behavioral disturbance   OSA on CPAP   Splenic artery aneurysm (HCC)   Asymptomatic bacteriuria   Alzheimer's dementia (HCC)   Lactic acidosis   Discharge Instructions:  Activity:  As tolerated with Full fall precautions use walker/cane & assistance as needed  Discharge Instructions     Call MD for:  difficulty breathing, headache or visual disturbances   Complete by: As directed    Diet - low sodium heart healthy   Complete by: As directed    Discharge instructions   Complete by: As directed    Follow with Primary MD  Chesley Noon, MD in 1-2 weeks  Please get a complete blood count and chemistry panel checked by your Primary MD at your next visit, and again as instructed by your Primary MD.  Get Medicines reviewed and adjusted: Please take all your medications with you for your next visit with your Primary MD  Laboratory/radiological data: Please request your Primary MD to go over all hospital tests and procedure/radiological results at the follow up, please ask your Primary MD to get all Hospital records sent to his/her office.  In some cases, they will be blood work, cultures and biopsy results pending at the time of your discharge. Please request that your primary care M.D. follows up on these results.  Also Note the following: If you experience worsening of your admission symptoms, develop shortness of breath, life threatening emergency, suicidal or homicidal thoughts you must  seek medical attention immediately by calling 911 or calling your MD immediately  if symptoms less severe.  You must read complete instructions/literature along with all the possible adverse reactions/side effects for all the Medicines you take and that have been prescribed to you. Take any new Medicines after you have completely understood and accpet all the possible adverse reactions/side effects.   Do not drive when taking Pain medications or sleeping medications (Benzodaizepines)  Do not take more than prescribed Pain, Sleep and Anxiety Medications. It is not advisable to combine anxiety,sleep and pain medications without talking with your primary care practitioner  Special Instructions: If you have smoked or chewed Tobacco  in the last 2 yrs please stop smoking, stop any regular Alcohol  and or any Recreational drug use.  Wear Seat belts while driving.  Please note: You were cared for by a hospitalist during your hospital stay. Once you are discharged, your primary care physician will handle any further medical issues. Please note that NO REFILLS for any discharge medications will be authorized once you are discharged, as it is imperative that you return to your primary care physician (or establish a relationship with a primary care physician if you do not have one) for your post hospital discharge needs so that they can reassess your need for medications and monitor your lab values.   Increase activity slowly   Complete by: As directed       Allergies as of 04/06/2021  Reactions   Enablex [darifenacin Hydrobromide Er]    PT STATES IT MAKES HIM "BONKERS" AND VERY TIRED   Ceftriaxone Other (See Comments)   Unknown reaction   Atorvastatin Other (See Comments)   Muscle aches/weakness   Ezetimibe Other (See Comments)   Muscle aches/weakness   Levaquin [levofloxacin] Diarrhea   Oxybutynin Other (See Comments)   Memory impairment, temperament changes   Sulfamethoxazole Other (See  Comments)   Childhood allergy   Sulfasalazine Other (See Comments)   Childhood allergy   Sulfonamide Derivatives Other (See Comments)   Childhood allergy   Metoprolol Rash        Medication List     TAKE these medications    apixaban 5 MG Tabs tablet Commonly known as: ELIQUIS Take 1 tablet (5 mg total) by mouth 2 (two) times daily.   carvedilol 3.125 MG tablet Commonly known as: COREG Take 3.125 mg by mouth 2 (two) times daily.   donepezil 10 MG tablet Commonly known as: ARICEPT Take 10 mg by mouth at bedtime.   Fish Oil 1200 MG Caps Take 1,200 mg by mouth 2 (two) times daily.   fluticasone 50 MCG/ACT nasal spray Commonly known as: FLONASE Place 2 sprays into both nostrils daily as needed for allergies.   furosemide 20 MG tablet Commonly known as: LASIX Take 1 tablet (20 mg total) by mouth daily.   ketoconazole 2 % cream Commonly known as: NIZORAL Apply 1 application topically daily.   lisinopril 20 MG tablet Commonly known as: ZESTRIL Take 20 mg by mouth at bedtime.   METAMUCIL PO Take 1 Dose by mouth daily. Mix in water and drink   mirabegron ER 50 MG Tb24 tablet Commonly known as: MYRBETRIQ Take 50 mg by mouth every morning.   multivitamin with minerals Tabs tablet Take 1 tablet by mouth every morning.   nitrofurantoin (macrocrystal-monohydrate) 100 MG capsule Commonly known as: MACROBID Take 1 capsule (100 mg total) by mouth in the morning.   Osteo Bi-Flex/5-Loxin Advanced Tabs Take 1 tablet by mouth 2 (two) times daily.   PRESCRIPTION MEDICATION Inhale into the lungs at bedtime. CPAP   rosuvastatin 10 MG tablet Commonly known as: CRESTOR TAKE 1/2 TABLET BY MOUTH EVERY OTHER DAY   Toviaz 8 MG Tb24 tablet Generic drug: fesoterodine Take 8 mg by mouth at bedtime.               Durable Medical Equipment  (From admission, onward)           Start     Ordered   04/05/21 1239  For home use only DME Hospital bed  Once        Question Answer Comment  Length of Need Lifetime   Patient has (list medical condition): dementia, CHF, covid,   The above medical condition requires: Patient requires the ability to reposition frequently   Bed type Semi-electric      04/05/21 1240            Follow-up Information     Chesley Noon, MD. Schedule an appointment as soon as possible for a visit in 1 week(s).   Specialty: Family Medicine Contact information: [place] [address] [phone]         Martinique, Peter M, MD Follow up in 1 week(s).   Specialty: Cardiology Contact information: [address] [place] [address] [phone]         Waynetta Sandy, MD. Schedule an appointment as soon as possible  for a visit in 2 week(s).   Specialties: Vascular Surgery, Cardiology Contact information: [place] [address] [phone]                Allergies  Allergen Reactions   Enablex [Darifenacin Hydrobromide Er]     PT STATES IT MAKES HIM "BONKERS" AND VERY TIRED   Ceftriaxone Other (See Comments)    Unknown reaction   Atorvastatin Other (See Comments)    Muscle aches/weakness   Ezetimibe Other (See Comments)    Muscle aches/weakness   Levaquin [Levofloxacin] Diarrhea   Oxybutynin Other (See Comments)    Memory impairment, temperament changes   Sulfamethoxazole Other (See Comments)    Childhood allergy   Sulfasalazine Other (See Comments)    Childhood allergy   Sulfonamide Derivatives Other (See Comments)    Childhood allergy   Metoprolol Rash     Other Procedures/Studies: CT Head Wo Contrast  Result Date: 04/02/2021 CLINICAL DATA:  Altered mental status EXAM: CT HEAD WITHOUT CONTRAST CT MAXILLOFACIAL WITHOUT CONTRAST TECHNIQUE: Multidetector CT imaging of the head and maxillofacial structures were performed using the standard protocol without intravenous contrast. Multiplanar CT image reconstructions of the  maxillofacial structures were also generated. RADIATION DOSE REDUCTION: This exam was performed according to the departmental dose-optimization program which includes automated exposure control, adjustment of the mA and/or kV according to patient size and/or use of iterative reconstruction technique. COMPARISON:  09/11/2020 FINDINGS: CT HEAD FINDINGS Brain: No evidence of acute infarction, hemorrhage, hydrocephalus, extra-axial collection or mass lesion/mass effect. Mild atrophic changes and chronic white matter ischemic changes are noted. Vascular: No hyperdense vessel or unexpected calcification. Skull: Normal. Negative for fracture or focal lesion. Other: None. CT MAXILLOFACIAL FINDINGS Osseous: No acute bony abnormality is noted. Orbits: Orbits and their contents are within normal limits. Sinuses: Paranasal sinuses show no mucosal thickening or air-fluid levels. Soft tissues: Surrounding soft tissue structures show no acute abnormality. Specifically the parotid glands are within normal limits bilaterally. IMPRESSION: CT of the head: Chronic atrophic and ischemic changes without acute abnormality. CT of the maxillofacial bones: No acute bony or soft tissue abnormality is noted. Electronically Signed   By: Inez Catalina M.D.   On: 04/02/2021 03:52   CT Angio Chest Pulmonary Embolism (PE) W or WO Contrast  Result Date: 04/02/2021 CLINICAL DATA:  Shortness of breath EXAM: CT ANGIOGRAPHY CHEST WITH CONTRAST TECHNIQUE: Multidetector CT imaging of the chest was performed using the standard protocol during bolus administration of intravenous contrast. Multiplanar CT image reconstructions and MIPs were obtained to evaluate the vascular anatomy. RADIATION DOSE REDUCTION: This exam was performed according to the departmental dose-optimization program which includes automated exposure control, adjustment of the mA and/or kV according to patient size and/or use of iterative reconstruction technique. CONTRAST:  73mL  OMNIPAQUE IOHEXOL 350 MG/ML SOLN COMPARISON:  None. FINDINGS: Cardiovascular: Heart size normal. No pericardial effusion. The RV is nondilated. Satisfactory opacification of pulmonary arteries noted, and there is no evidence of pulmonary emboli. Moderate coronary calcifications. Incomplete contrast opacification of the thoracic aorta. No suggestion of dissection, aneurysm or stenosis. 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 Scattered calcified plaque in the distal arch and descending thoracic aorta. 2.1 cm peripherally calcified mid splenic artery aneurysm Mediastinum/Nodes: Hiatal hernia. Subcarinal adenopathy, nodes measure up to 1.3 cm short axis diameter. 1.1 cm right paratracheal lymph node (Im51,Se5) . no hilar adenopathy. Lungs/Pleura: Trace bilateral pleural effusions. Geographic ground-glass opacities in the lung bases and dependent aspects of both  lungs may represent atelectasis, edema, or alveolitis. There is some peripheral interstitial thickening in the lung bases, and upper lobes left more than right, suggesting possible interstitial edema. Upper Abdomen: 1 cm low-attenuation lesion in hepatic segment 2, possibly cyst but incompletely characterized. 2.1 cm peripherally calcified aneurysm in the mid splenic artery without evidence of rupture. Hiatal hernia involving the gastric fundus. 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 3.5 cm cystic lesion in the right humeral head, present since at least 08/07/2019. Anterior vertebral endplate spurring at multiple levels in the mid and lower thoracic spine. Review of the MIP images confirms the above findings. IMPRESSION: 1. Negative for acute PE or thoracic aortic dissection. 2. Coronary and Aortic Atherosclerosis (ICD10-170.0). 3. Right paratracheal and subcarinal adenopathy of indeterminate etiology. 4. Dependent alveolar opacities in both lungs may represent, edema versus infiltrate. 5. 2.1 cm  mid splenic artery aneurysm. Visceral aneurysms greater than 2 cm are at increased risk for rupture. 6. Coronary and Aortic Atherosclerosis (ICD10-170.0). Electronically Signed   By: Lucrezia Europe M.D.   On: 04/02/2021 14:59   CT Maxillofacial W Contrast  Result Date: 04/02/2021 CLINICAL DATA:  Altered mental status EXAM: CT HEAD WITHOUT CONTRAST CT MAXILLOFACIAL WITHOUT CONTRAST TECHNIQUE: Multidetector CT imaging of the head and maxillofacial structures were performed using the standard protocol without intravenous contrast. Multiplanar CT image reconstructions of the maxillofacial structures were also generated. RADIATION DOSE REDUCTION: This exam was performed according to the departmental dose-optimization program which includes automated exposure control, adjustment of the mA and/or kV according to patient size and/or use of iterative reconstruction technique. COMPARISON:  09/11/2020 FINDINGS: CT HEAD FINDINGS Brain: No evidence of acute infarction, hemorrhage, hydrocephalus, extra-axial collection or mass lesion/mass effect. Mild atrophic changes and chronic white matter ischemic changes are noted. Vascular: No hyperdense vessel or unexpected calcification. Skull: Normal. Negative for fracture or focal lesion. Other: None. CT MAXILLOFACIAL FINDINGS Osseous: No acute bony abnormality is noted. Orbits: Orbits and their contents are within normal limits. Sinuses: Paranasal sinuses show no mucosal thickening or air-fluid levels. Soft tissues: Surrounding soft tissue structures show no acute abnormality. Specifically the parotid glands are within normal limits bilaterally. IMPRESSION: CT of the head: Chronic atrophic and ischemic changes without acute abnormality. CT of the maxillofacial bones: No acute bony or soft tissue abnormality is noted. Electronically Signed   By: Inez Catalina M.D.   On: 04/02/2021 03:52   DG Chest Port 1 View  Result Date: 04/02/2021 CLINICAL DATA:  Possible sepsis EXAM: PORTABLE CHEST  1 VIEW COMPARISON:  01/07/2021 FINDINGS: Cardiac shadow is enlarged but stable. Lungs are well aerated bilaterally. Mild bibasilar atelectasis is seen. No acute bony abnormality is noted. IMPRESSION: Mild bibasilar atelectasis. Electronically Signed   By: Inez Catalina M.D.   On: 04/02/2021 02:34     TODAY-DAY OF DISCHARGE:  Subjective:   [name] [name] today has no headache,no chest abdominal pain,no new weakness tingling or numbness, feels much better wants to go home today.  Objective:   Blood pressure (!) 147/93, pulse 79, temperature (!) 97.3 F (36.3 C), temperature source Oral, resp. rate (!) 22, height 5\' 10"  (1.778 m), weight 97.9 kg, SpO2 95 %.  Intake/Output Summary (Last 24 hours) at 04/06/2021 0915 Last data filed at 04/06/2021 0357 Gross per 24 hour  Intake --  Output 400 ml  Net -400 ml   Filed Weights   04/02/21 0138 04/04/21 0400 04/05/21 0351  Weight: 95.3 kg 93.6 kg 97.9 kg    Exam: Awake  Alert, Oriented *3, No new F.N deficits, Normal affect [place].AT,PERRAL Supple Neck,No JVD, No cervical lymphadenopathy appriciated.  Symmetrical Chest wall movement, Good air movement bilaterally, CTAB RRR,No Gallops,Rubs or new Murmurs, No Parasternal Heave +ve B.Sounds, Abd Soft, Non tender, No organomegaly appriciated, No rebound -guarding or rigidity. No Cyanosis, Clubbing or edema, No new Rash or bruise   PERTINENT RADIOLOGIC STUDIES: No results found.   PERTINENT LAB RESULTS: CBC: Recent Labs    04/04/21 0217  WBC 5.6  HGB 13.2  HCT 39.2  PLT 185   CMET CMP     Component Value Date/Time   NA 138 04/04/2021 0217   K 3.7 04/04/2021 0217   CL 103 04/04/2021 0217   CO2 25 04/04/2021 0217   GLUCOSE 135 (H) 04/04/2021 0217   BUN 23 04/04/2021 0217   CREATININE 1.21 04/04/2021 0217   CALCIUM 8.9 04/04/2021 0217   PROT 6.0 (L) 04/04/2021 0217   ALBUMIN 2.9 (L) 04/04/2021 0217   AST 26 04/04/2021 0217   ALT 28 04/04/2021 0217   ALKPHOS 47 04/04/2021 0217    BILITOT 0.5 04/04/2021 0217   GFRNONAA 60 (L) 04/04/2021 0217   GFRAA >60 09/15/2018 0438    GFR Estimated Creatinine Clearance: 55.3 mL/min (by C-G formula based on SCr of 1.21 mg/dL). No results for input(s): LIPASE, AMYLASE in the last 72 hours. No results for input(s): CKTOTAL, CKMB, CKMBINDEX, TROPONINI in the last 72 hours. Invalid input(s): POCBNP No results for input(s): DDIMER in the last 72 hours. No results for input(s): HGBA1C in the last 72 hours. No results for input(s): CHOL, HDL, LDLCALC, TRIG, CHOLHDL, LDLDIRECT in the last 72 hours. No results for input(s): TSH, T4TOTAL, T3FREE, THYROIDAB in the last 72 hours.  Invalid input(s): FREET3 No results for input(s): VITAMINB12, FOLATE, FERRITIN, TIBC, IRON, RETICCTPCT in the last 72 hours. Coags: No results for input(s): INR in the last 72 hours.  Invalid input(s): PT Microbiology: Recent Results (from the past 240 hour(s))  Blood Culture (routine x 2)     Status: None (Preliminary result)   Collection Time: 04/02/21  1:40 AM   Specimen: BLOOD LEFT HAND  Result Value Ref Range Status   Specimen Description BLOOD LEFT HAND  Final   Special Requests   Final    BOTTLES DRAWN AEROBIC AND ANAEROBIC Blood Culture results may not be optimal due to an inadequate volume of blood received in culture bottles   Culture   Final    NO GROWTH 4 DAYS Performed at [facility], [place] [address], [place], [place] [address]    Report Status PENDING  Incomplete  Blood Culture (routine x 2)     Status: None (Preliminary result)   Collection Time: 04/02/21  1:40 AM   Specimen: BLOOD RIGHT HAND  Result Value Ref Range Status   Specimen Description BLOOD RIGHT HAND  Final   Special Requests   Final    BOTTLES DRAWN AEROBIC AND ANAEROBIC Blood Culture adequate volume   Culture   Final    NO GROWTH 4 DAYS Performed at [facility], [place] [address] [place], [place] [address]    Report Status PENDING  Incomplete  Resp Panel  by RT-PCR (Flu A&B, Covid)     Status: Abnormal   Collection Time: 04/02/21  2:06 AM   Specimen: Nasopharyngeal(NP) swabs in vial transport medium  Result Value Ref Range Status   SARS Coronavirus 2 by RT PCR POSITIVE (A) NEGATIVE Final    Comment: (NOTE)  SARS-CoV-2 target nucleic acids are DETECTED.  The SARS-CoV-2 RNA is generally detectable in upper respiratory specimens during the acute phase of infection. Positive results are indicative of the presence of the identified virus, but do not rule out bacterial infection or co-infection with other pathogens not detected by the test. Clinical correlation with patient history and other diagnostic information is necessary to determine patient infection status. The expected result is Negative.  Fact Sheet for Patients: EntrepreneurPulse.com.au  Fact Sheet for Healthcare Providers: IncredibleEmployment.be  This test is not yet approved or cleared by the [place] FDA and  has been authorized for detection and/or diagnosis of SARS-CoV-2 by FDA under an Emergency Use Authorization (EUA).  This EUA will remain in effect (meaning this test can be used) for the duration of  the COVID-19 declaration under Section 564(b)(1) of the A ct, 21 U.S.C. section 360bbb-3(b)(1), unless the authorization is terminated or revoked sooner.     Influenza A by PCR NEGATIVE NEGATIVE Final   Influenza B by PCR NEGATIVE NEGATIVE Final    Comment: (NOTE) The Xpert Xpress SARS-CoV-2/FLU/RSV plus assay is intended as an aid in the diagnosis of influenza from Nasopharyngeal swab specimens and should not be used as a sole basis for treatment. Nasal washings and aspirates are unacceptable for Xpert Xpress SARS-CoV-2/FLU/RSV testing.  Fact Sheet for Patients: EntrepreneurPulse.com.au  Fact Sheet for Healthcare Providers: IncredibleEmployment.be  This test is not yet approved or  cleared by the [place] FDA and has been authorized for detection and/or diagnosis of SARS-CoV-2 by FDA under an Emergency Use Authorization (EUA). This EUA will remain in effect (meaning this test can be used) for the duration of the COVID-19 declaration under Section 564(b)(1) of the Act, 21 U.S.C. section 360bbb-3(b)(1), unless the authorization is terminated or revoked.  Performed at [facility], [place] [address] [place], [place] [address]   Urine Culture     Status: Abnormal   Collection Time: 04/02/21  2:22 AM   Specimen: In/Out Cath Urine  Result Value Ref Range Status   Specimen Description IN/OUT CATH URINE  Final   Special Requests   Final    NONE Performed at [facility], [place] [address] [place], [place] [address]    Culture 50,000 COLONIES/mL PSEUDOMONAS AERUGINOSA (A)  Final   Report Status 04/04/2021 FINAL  Final   Organism ID, Bacteria PSEUDOMONAS AERUGINOSA (A)  Final      Susceptibility   Pseudomonas aeruginosa - MIC*    CEFTAZIDIME <=1 SENSITIVE Sensitive     CIPROFLOXACIN <=0.25 SENSITIVE Sensitive     GENTAMICIN <=1 SENSITIVE Sensitive     IMIPENEM <=0.25 SENSITIVE Sensitive     PIP/TAZO <=4 SENSITIVE Sensitive     CEFEPIME 2 SENSITIVE Sensitive     * 50,000 COLONIES/mL PSEUDOMONAS AERUGINOSA    FURTHER DISCHARGE INSTRUCTIONS:  Get Medicines reviewed and adjusted: Please take all your medications with you for your next visit with your Primary MD  Laboratory/radiological data: Please request your Primary MD to go over all hospital tests and procedure/radiological results at the follow up, please ask your Primary MD to get all Hospital records sent to his/her office.  In some cases, they will be blood work, cultures and biopsy results pending at the time of your discharge. Please request that your primary care M.D. goes through all the records of your hospital data and follows up on these results.  Also Note the following: If you  experience worsening of your admission symptoms, develop  shortness of breath, life threatening emergency, suicidal or homicidal thoughts you must seek medical attention immediately by calling 911 or calling your MD immediately  if symptoms less severe.  You must read complete instructions/literature along with all the possible adverse reactions/side effects for all the Medicines you take and that have been prescribed to you. Take any new Medicines after you have completely understood and accpet all the possible adverse reactions/side effects.   Do not drive when taking Pain medications or sleeping medications (Benzodaizepines)  Do not take more than prescribed Pain, Sleep and Anxiety Medications. It is not advisable to combine anxiety,sleep and pain medications without talking with your primary care practitioner  Special Instructions: If you have smoked or chewed Tobacco  in the last 2 yrs please stop smoking, stop any regular Alcohol  and or any Recreational drug use.  Wear Seat belts while driving.  Please note: You were cared for by a hospitalist during your hospital stay. Once you are discharged, your primary care physician will handle any further medical issues. Please note that NO REFILLS for any discharge medications will be authorized once you are discharged, as it is imperative that you return to your primary care physician (or establish a relationship with a primary care physician if you do not have one) for your post hospital discharge needs so that they can reassess your need for medications and monitor your lab values.  Total Time spent coordinating discharge including counseling, education and face to face time equals greater than 30 minutes.  Signed: Halley Shepheard 04/06/2021 9:15 AM

## 2021-04-06 NOTE — Assessment & Plan Note (Addendum)
Likely colonization or asymptomatic bacteriuria-has no symptoms of UTI-he is presenting symptoms were consistent with COVID-19 infection-and has improved without initiation of any antibiotics.  Do not think this needs to be treated.  Patient is on chronic suppressive Macrobid therapy.

## 2021-04-07 ENCOUNTER — Ambulatory Visit (INDEPENDENT_AMBULATORY_CARE_PROVIDER_SITE_OTHER): Payer: Medicare Other | Admitting: Vascular Surgery

## 2021-04-07 DIAGNOSIS — I728 Aneurysm of other specified arteries: Secondary | ICD-10-CM | POA: Diagnosis not present

## 2021-04-07 LAB — CULTURE, BLOOD (ROUTINE X 2)
Culture: NO GROWTH
Culture: NO GROWTH
Special Requests: ADEQUATE

## 2021-04-07 NOTE — Progress Notes (Signed)
Virtual Visit via Telephone Note  Referring MD: Dr. Sloan Leiter  I connected with [name] [name] on 04/07/2021  by telephone and verified that I was speaking with the correct person using two identifiers. Patient was located outside of our office and accompanied by his poa.  I am located at the office and him alone   The limitations of evaluation and management by telemedicine and the availability of in person appointments have been previously discussed with the patient and are documented in the patients chart. The patient expressed understanding and consented to proceed.  PCP: Chesley Noon, MD   Chief Complaint: splenic artery aneurysm  History of Present Illness: [name] [name] is a 81 y.o. male with recent admission for COVID-19 underwent CT scan with concern for pulmonary embolus this demonstrated splenic artery aneurysm.  Patient has Alzheimer's dementia all information obtained from his power of attorney who is his daughter.  They are currently in the car outside of our office after attempting to be evaluated today with recent diagnosis of COVID.  He has no new back or abdominal pain.  He has no previous studies to compare to.  Past Medical History:  Diagnosis Date   Alzheimer disease ([place])    Chronic kidney disease    mild insuffiency   Coronary artery disease    LHC 4/10: Mid LAD 40-50%, then 70%, OM1 20-30%, EF 65%. Mid LAD FFR 0.89 (not hemodynamically significant). Medical therapy was continued.   Dysuria 08/10/2020   Hyperlipidemia    LBBB (left bundle branch block) 06/2018   Obstructive sleep apnea     Past Surgical History:  Procedure Laterality Date   CARDIAC CATHETERIZATION  05/02/2003   single vessel,moderate stenosis mid LAD   CARDIAC CATHETERIZATION  06/12/2008   continue med. therapy   right shoulder surgery     TOTAL HIP ARTHROPLASTY Left 09/14/2018   Procedure: LEFT TOTAL HIP ARTHROPLASTY ANTERIOR APPROACH;  Surgeon: Mcarthur Rossetti, MD;   Location: [facility];  Service: Orthopedics;  Laterality: Left;    No outpatient medications have been marked as taking for the 04/07/21 encounter (Appointment) with Waynetta Sandy, MD.    12 system ROS was negative unless otherwise noted in HPI   Observations/Objective: Patient's daughter demonstrates good understanding of our conversation.  Data: CT IMPRESSION: 1. Negative for acute PE or thoracic aortic dissection. 2. Coronary and Aortic Atherosclerosis (ICD10-170.0). 3. Right paratracheal and subcarinal adenopathy of indeterminate etiology. 4. Dependent alveolar opacities in both lungs may represent, edema versus infiltrate. 5. 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 Visceral aneurysms greater than 2 cm are at increased risk for rupture. 6. Coronary and Aortic Atherosclerosis (ICD10-170.0).    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  Unlikely risk of rupture given his age and calcification.  Unfortunately no scans to compare to.  I discussed with the patient and his daughter via telephone that any acute onset moderate to severe abdominal pain will require urgent medical evaluation for splenic artery rupture although I think this is low risk.  We will follow him up in 1 year with CT angio of his abdomen and pelvis to further evaluate and compare sizing.  If at that time there is no growth we can consider lengthening the interval of follow-up versus allowing him to follow-up as needed.    Follow Up Instructions:   Follow up 1 year with CT scan   I discussed the  assessment and treatment plan with the patient. The patient was provided an opportunity to ask questions and all were answered. The patient agreed with the plan and demonstrated an understanding of the instructions.   The patient was advised to call back or seek an in-person evaluation if the symptoms worsen or if the condition fails to improve as  anticipated.  I spent 11 minutes with the patient via telephone encounter and in reviewing the chart and imaging prior to discussion..   Signed, Servando Snare [facility]: [phone]  04/07/2021, 1:51 PM

## 2021-04-16 NOTE — Progress Notes (Signed)
Cardiology Office Note:    Date:  04/20/2021   ID:  [name] [name], DOB November 02, 1940, MRN [id number]  PCP:  Chesley Noon, MD  Cardiologist:  Rakeen Gaillard Martinique, MD   Referring MD: Chesley Noon, MD   Chief Complaint  Patient presents with   Congestive Heart Failure   Coronary Artery Disease     History of Present Illness:    [name] [name] is a 81 y.o. male with a hx of Alzheimer's disease, diastolic heart failure, OSA on CPAP, hypertension, hyperlipidemia, moderate nonobstructive CAD on cardiac catheterization in 2010, chronic shortness of breath, left bundle branch block, CKD stage III, and A. fib.  FFR on LAD in 2010 was negative, CAD treated with medical therapy.  Nuclear stress test in May 2020 completed for evaluation of new LBBB on EKG was low risk with no evidence of ischemia.    He was treated for parotitis (ABX) and was admitted 5/29-07/31/20 for this. He was followed by ENT.   Patient was seen in our clinic on 09/07/2020 by Sande Rives, PA-C and complained of shortness of breath..  His daughter was also concerned because they were told he had CHF and A. fib.  Unclear CHF diagnosis, patient does have mild diastolic dysfunction.  He does take Lasix daily.  Ms. Sarajane Jews did not feel his symptoms were consistent with CHF.  They did discuss nuclear stress test to rule out an anginal equivalent.  Through shared decision making, they decided to try PT first.  In review of EKGs, artifact makes definitive interpretation difficult. It appears there were P waves and not Afib.  Question wandering pacemaker and PACs.  He was in sinus rhythm in July 2022.  He was admitted 09/11/2020 and treated for UTI.   He presented to the ER 01/07/21 with agitation, sore throat, productive cough, swelling of his left jaw and was admitted with sepsis due to deep neck infection.  This resolved with Decadron and antibiotics.  Telemetry and EKG revealed atrial fibrillation.   Anticoagulation was discussed but  family preferred to speak with cardiology before starting anticoagulation.  He was rate controlled while in the hospital, however heart rates in the 140s with any activity.  The patient was asymptomatic.  Low-dose metoprolol was discussed, but this was not started.  The patient's daughter called our office to report that he is walking 1.5 to 2 miles per day but is very short of breath with this activity.  He was subsequently seen in our office by Fabian Sharp PA-C. He was back in NSR. Anticoagulation was discussed with son who was present and this was to be discussed further with daughter. He has since started on Eliquis.   He was admitted in early Feb with acute mental status changes and SIRS related to Covid infection. Found incidentally to have a 2.1 cm splenic artery aneurysm. Seen by Dr Donzetta Matters who recommended follow up CT in one year. Risk of rupture felt to be low.   On follow up today he is seen with his son. He still notes SOB when bending over or turning. This is chronic. No residual cough with Covid. Back walking again and appetite is good. Did not have any Afib during hospital stay.Denies any palpitations.     Past Medical History:  Diagnosis Date   Alzheimer disease ([place])    Chronic kidney disease    mild insuffiency   Coronary artery disease    LHC 4/10: Mid LAD 40-50%, then 70%, OM1 20-30%, EF 65%.  Mid LAD FFR 0.89 (not hemodynamically significant). Medical therapy was continued.   Dysuria 08/10/2020   Hyperlipidemia    LBBB (left bundle branch block) 06/2018   Obstructive sleep apnea     Past Surgical History:  Procedure Laterality Date   CARDIAC CATHETERIZATION  05/02/2003   single vessel,moderate stenosis mid LAD   CARDIAC CATHETERIZATION  06/12/2008   continue med. therapy   right shoulder surgery     TOTAL HIP ARTHROPLASTY Left 09/14/2018   Procedure: LEFT TOTAL HIP ARTHROPLASTY ANTERIOR APPROACH;  Surgeon: Mcarthur Rossetti, MD;  Location: [facility];  Service:  Orthopedics;  Laterality: Left;    Current Medications: Current Meds  Medication Sig   apixaban (ELIQUIS) 5 MG TABS tablet Take 1 tablet (5 mg total) by mouth 2 (two) times daily.   carvedilol (COREG) 3.125 MG tablet Take 3.125 mg by mouth 2 (two) times daily.   donepezil (ARICEPT) 10 MG tablet Take 10 mg by mouth at bedtime.   fesoterodine (TOVIAZ) 8 MG TB24 tablet Take 8 mg by mouth at bedtime.   fluticasone (FLONASE) 50 MCG/ACT nasal spray Place 2 sprays into both nostrils daily as needed for allergies.   furosemide (LASIX) 20 MG tablet Take 1 tablet (20 mg total) by mouth daily.   ketoconazole (NIZORAL) 2 % cream Apply 1 application topically daily.   lisinopril (ZESTRIL) 20 MG tablet Take 20 mg by mouth at bedtime.   mirabegron ER (MYRBETRIQ) 50 MG TB24 tablet Take 50 mg by mouth every morning.   Misc Natural Products (OSTEO BI-FLEX/5-LOXIN ADVANCED) TABS Take 1 tablet by mouth 2 (two) times daily.   Multiple Vitamin (MULTIVITAMIN WITH MINERALS) TABS tablet Take 1 tablet by mouth every morning.   nitrofurantoin, macrocrystal-monohydrate, (MACROBID) 100 MG capsule Take 1 capsule (100 mg total) by mouth in the morning.   Omega-3 Fatty Acids (FISH OIL) 1200 MG CAPS Take 1,200 mg by mouth 2 (two) times daily.   PRESCRIPTION MEDICATION Inhale into the lungs at bedtime. CPAP   Psyllium (METAMUCIL PO) Take 1 Dose by mouth daily. Mix in water and drink   rosuvastatin (CRESTOR) 10 MG tablet TAKE 1/2 TABLET BY MOUTH EVERY OTHER DAY (Patient taking differently: Take 5 mg by mouth every other day.)     Allergies:   Enablex [darifenacin hydrobromide er], Ceftriaxone, Atorvastatin, Ezetimibe, Levaquin [levofloxacin], Oxybutynin, Sulfamethoxazole, Sulfasalazine, Sulfonamide derivatives, and Metoprolol   Social History   Socioeconomic History   Marital status: Widowed    Spouse name: Not on file   Number of children: 2   Years of education: Not on file   Highest education level: Not on file   Occupational History   Occupation: [employer]  Tobacco Use   Smoking status: Former    Types: Pipe    Quit date: 02/28/1990    Years since quitting: 31.1   Smokeless tobacco: Never   Tobacco comments:    smoked pipe only   Vaping Use   Vaping Use: Never used  Substance and Sexual Activity   Alcohol use: No    Alcohol/week: 0.0 standard drinks    Comment: rarely wine   Drug use: No   Sexual activity: Not on file  Other Topics Concern   Not on file  Social History Narrative   Not on file   Social Determinants of Health   Financial Resource Strain: Not on file  Food Insecurity: Not on file  Transportation Needs: Not on file  Physical Activity: Not on file  Stress: Not on  file  Social Connections: Not on file     Family History: The patient's family history includes Cancer in his mother; Hypertension in his father and mother; Other in his father.  ROS:   Please see the history of present illness.     All other systems reviewed and are negative.  EKGs/Labs/Other Studies Reviewed:    The following studies were reviewed today:  Echo 04/2020: 1. Left ventricular ejection fraction, by estimation, is 60 to 65%. The  left ventricle has normal function. The left ventricle has no regional  wall motion abnormalities. There is mild left ventricular hypertrophy.  Left ventricular diastolic parameters  are consistent with Grade I diastolic dysfunction (impaired relaxation).   2. Right ventricular systolic function is normal. The right ventricular  size is normal.   3. The mitral valve is normal in structure. Mild mitral valve  regurgitation. No evidence of mitral stenosis.   4. The aortic valve is tricuspid. Aortic valve regurgitation is trivial.  Mild aortic valve sclerosis is present, with no evidence of aortic valve  stenosis.   5. The inferior vena cava is normal in size with greater than 50%  respiratory variability, suggesting right atrial pressure of 3 mmHg.    EKG:  EKG is not ordered today.    Recent Labs: 04/21/2020: NT-Pro BNP 100 04/02/2021: B Natriuretic Peptide 437.5 04/04/2021: ALT 28; BUN 23; Creatinine, Ser 1.21; Hemoglobin 13.2; Magnesium 2.2; Platelets 185; Potassium 3.7; Sodium 138  Recent Lipid Panel No results found for: CHOL, TRIG, HDL, CHOLHDL, VLDL, LDLCALC, LDLDIRECT  Dated 09/15/20: cholesterol 184, triglycerides 176, HDL 27, LDL 125  Physical Exam:    VS:  BP 120/70 (BP Location: Left Arm, Patient Position: Sitting)    Pulse 72    Ht 5\' 10"  (1.778 m)    Wt 203 lb 3.2 oz (92.2 kg)    SpO2 97%    BMI 29.16 kg/m     Wt Readings from Last 3 Encounters:  04/20/21 203 lb 3.2 oz (92.2 kg)  04/05/21 215 lb 13.3 oz (97.9 kg)  03/04/21 200 lb (90.7 kg)     GEN:  Well nourished, well developed in no acute distress HEENT: Normal NECK: No JVD; No carotid bruits LYMPHATICS: No lymphadenopathy CARDIAC: RRR, no murmurs, rubs, gallops RESPIRATORY:  Clear to auscultation without rales, wheezing or rhonchi  ABDOMEN: Soft, non-tender, non-distended MUSCULOSKELETAL:  tr B LE edema- compression socks in place; No deformity  SKIN: Warm and dry NEUROLOGIC:  Alert and oriented x 3 PSYCHIATRIC:  Normal affect   ASSESSMENT:    1. Chronic diastolic CHF (congestive heart failure) ([place])   2. Coronary artery disease involving native coronary artery of native heart with angina pectoris ([place])   3. Primary hypertension   4. Paroxysmal atrial fibrillation (H[facility])      PLAN:    In order of problems listed above:  1.Paroxysmal Atrial fibrillation/flutter - I have reviewed EKGs, there was atrial flutter in June and atrial fibrillation in July associated with infections.  - converted back to normal sinus rhythm -He is asymptomatic.  - no recurrence on recent hospitalization with COVID and SIRS. - on Eliquis for anticoagulation.  Mali vasc score of 5  2. Chronic diastolic heart failure - volume status looks good on lasix 20 mg daily - echo  earlier this year reassuring   3. Shortness of breath- chronic - echo 04/2020 with preserved EF and mild DD, no significant valvular issues - maintained on 20 mg lasix - I suspect there  is a major component of deconditioning. Encourage as much walking as he can tolerate.   4. Hypertension - continue 20 mg lisinopril -Start low-dose metoprolol as above - controlled today   Follow-up with in 6 months   Medication Adjustments/Labs and Tests Ordered: Current medicines are reviewed at length with the patient today.  Concerns regarding medicines are outlined above.  No orders of the defined types were placed in this encounter.   No orders of the defined types were placed in this encounter.    Signed, Gopal Malter Martinique, MD  04/20/2021 2:48 PM    [facility]

## 2021-04-19 ENCOUNTER — Other Ambulatory Visit: Payer: Self-pay

## 2021-04-19 MED ORDER — APIXABAN 5 MG PO TABS: 5.0000 mg | ORAL_TABLET | Freq: Two times a day (BID) | ORAL | 2 refills | Status: DC

## 2021-04-19 NOTE — Telephone Encounter (Signed)
Prescription refill request for Eliquis received. Indication:Afib Last office visit:12/22 Scr:1.2 Age: 81 Weight:97.9 kg  Prescription refilled

## 2021-04-20 ENCOUNTER — Ambulatory Visit (INDEPENDENT_AMBULATORY_CARE_PROVIDER_SITE_OTHER): Payer: Medicare Other | Admitting: Cardiology

## 2021-04-20 ENCOUNTER — Encounter: Payer: Self-pay | Admitting: Cardiology

## 2021-04-20 ENCOUNTER — Other Ambulatory Visit: Payer: Self-pay

## 2021-04-20 ENCOUNTER — Telehealth: Payer: Self-pay | Admitting: Internal Medicine

## 2021-04-20 VITALS — BP 120/70 | HR 72 | Ht 70.0 in | Wt 203.2 lb

## 2021-04-20 DIAGNOSIS — I5032 Chronic diastolic (congestive) heart failure: Secondary | ICD-10-CM | POA: Diagnosis not present

## 2021-04-20 DIAGNOSIS — I480 Paroxysmal atrial fibrillation: Secondary | ICD-10-CM

## 2021-04-20 DIAGNOSIS — I10 Essential (primary) hypertension: Secondary | ICD-10-CM | POA: Diagnosis not present

## 2021-04-20 DIAGNOSIS — G4733 Obstructive sleep apnea (adult) (pediatric): Secondary | ICD-10-CM

## 2021-04-20 DIAGNOSIS — I25119 Atherosclerotic heart disease of native coronary artery with unspecified angina pectoris: Secondary | ICD-10-CM | POA: Diagnosis not present

## 2021-04-22 NOTE — Telephone Encounter (Signed)
Called and spoke with pt to see if I could schedule him an appt with CY as he was due for one. Pt asked for me to call daughter Elzie Rings as she is the one who takes him to appts. Called and spoke with Elzie Rings and have scheduled pt an appt Monday 2/27 at 10am with CY.  Elzie Rings also asked about CPAP settings and if we had received a call from [facility] and I stated to her that we had. Per prior phone note with CY, he stated CPAP auto 5-15cm with mask of choice, humidifier, supplies, Airview/card.  Did go ahead and placed order but this can also be further discussed at pt's OV.

## 2021-04-23 NOTE — Progress Notes (Signed)
HPI male former smoker followed for OSA, complicated by CAD, CHF, venous stasis dermatitis, chronic renal disease PFT: 12/16/2011-within normal limits. Small airway flows were quite normal but improved with bronchodilator. FEV1 3.03/107%, FEV1/FVC 0.82, DLCO 0.89. NPSG 12/25/91- AHI 19.3/ hr, desat to 66%, body weight 206 lbs ------------------------------------------------------------------------------------------------   04/20/20- 81 year old male former smoker followed for OSA, complicated by CAD/ CHF, chronic renal disease, Dementia/ Alzheimers, GERD with esophagitis, Venous stasis dermatititis,  CPAP auto 5-15/Respicare ([place]) new machine in 2020> recalled Download- using old machine- unable to download Body weight today- Covid vax-3 Moderna              Here with daughter Flu vax-had -----Patient feels like he is sleeping good, machine is working good, shortness of breath at rest worse with exertion, denies cough No concerns about his CPAP function. His DreamStation machine was recalled and he is using an old machine, but it works well enough, so he is waiting till the recall issuss are resolved.  - Problem DOE- gradually more DOE over past several years. He talks in terms of walking laps around his 300 foot driveway. Little cough or wheeze. No acute vent or chest pain. He will f/u with his cardiologist.  04/26/21- 81 year old male former smoker followed for OSA, complicated by CAD/ CHF, chronic renal disease, Dementia/ Alzheimers, GERD with esophagitis, Venous stasis dermatititis, Covid infection [id number],  CPAP auto 5-15/[facility]picare [facility]) new machine in 2020> recalled     Replacement ordered 04/20/21 Download- daughter's old machine- no download Body weight today-206 Covid vax-3 Moderna               Flu vax-had Hosp Feb Covid infection+> Remdesivir, complicated by CHF, AFib on admission.  Here alone but daughter Elzie Rings has sent a note asking that we send the correct CPAP settings to  [facility] for his replacement machine after previous CPAP machine was recalled.  That order has been sent February 21. He describes breathing is stable.  This exam is clear now with regular pulse but note that BNP was 437 on February 3. CTa chest 04/02/21- IMPRESSION: 1. Negative for acute PE or thoracic aortic dissection. 2. Coronary and Aortic Atherosclerosis (ICD10-170.0). 3. Right paratracheal and subcarinal adenopathy of indeterminate etiology. 4. Dependent alveolar opacities in both lungs may represent, edema versus infiltrate. 5. 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 Visceral aneurysms greater than 2 cm are at increased risk for rupture. 6. Coronary and Aortic Atherosclerosis (ICD10-170.0).   ROS-see HPI  + = positive Constitutional:   No-   weight loss, night sweats, fevers, chills, fatigue, lassitude. HEENT:   No-  headaches, difficulty swallowing, tooth/dental problems, sore throat,       No-  sneezing, itching, ear ache, nasal congestion, post nasal drip,  CV:  No-   chest pain, orthopnea, PND, swelling in lower extremities, anasarca,                                           dizziness, palpitations Resp: + shortness of breath with exertion or at rest.              No-   productive cough,  No non-productive cough,  No- coughing up of blood.              No-   change in color of mucus.  No- wheezing.   Skin: No-   rash or  lesions. GI:   GU:  MS:  No-   joint pain or swelling.   Neuro-     Memory decline- Alzheimers Psych:  No- change in mood or affect. No depression or anxiety.  + memory loss.  OBJ General- Alert, Oriented, Affect-appropriate, Distress- none acute. +Obese Skin- rash-none, lesions- none, excoriation- none Lymphadenopathy- none Head- atraumatic            Eyes- Gross vision intact, PERRLA, +Strabismus, conjunctivae clear secretions            Ears- +Hearing aids, + scarring both TMs            Nose- Clear, no-Septal dev, mucus, polyps, erosion,  perforation             Throat- Mallampati IV , mucosa clear , drainage- none, tonsils- atrophic Neck- flexible , trachea midline, no stridor , thyroid nl, carotid no bruit Chest - symmetrical excursion , unlabored           Heart/CV- RRR ,  Murmur+trace S , no gallop  , no rub, nl s1 s2                           - JVD- none , edema- none, stasis changes- none, varices- none           Lung- clear to P&A, wheeze- none, cough- none , dullness-none, rub- none           Chest wall-  Abd-  Br/ Gen/ Rectal- Not done, not indicated Extrem- + elastic hose, cane Neuro- grossly intact to observation

## 2021-04-26 ENCOUNTER — Ambulatory Visit (INDEPENDENT_AMBULATORY_CARE_PROVIDER_SITE_OTHER): Payer: Medicare Other | Admitting: Internal Medicine

## 2021-04-26 ENCOUNTER — Ambulatory Visit (INDEPENDENT_AMBULATORY_CARE_PROVIDER_SITE_OTHER)
Admission: RE | Admit: 2021-04-26 | Discharge: 2021-04-26 | Disposition: A | Discharge status: Home / Self Care | Payer: Medicare Other | Source: Ambulatory Visit | Attending: Internal Medicine | Admitting: Internal Medicine

## 2021-04-26 ENCOUNTER — Other Ambulatory Visit: Payer: Self-pay

## 2021-04-26 ENCOUNTER — Encounter: Payer: Self-pay | Admitting: Internal Medicine

## 2021-04-26 VITALS — BP 122/72 | HR 68 | Temp 98.2°F | Ht 70.0 in | Wt 206.6 lb

## 2021-04-26 DIAGNOSIS — Z9989 Dependence on other enabling machines and devices: Secondary | ICD-10-CM

## 2021-04-26 DIAGNOSIS — I5032 Chronic diastolic (congestive) heart failure: Secondary | ICD-10-CM | POA: Diagnosis not present

## 2021-04-26 DIAGNOSIS — R0609 Other forms of dyspnea: Secondary | ICD-10-CM

## 2021-04-26 DIAGNOSIS — G4733 Obstructive sleep apnea (adult) (pediatric): Secondary | ICD-10-CM

## 2021-04-26 DIAGNOSIS — U099 Post covid-19 condition, unspecified: Secondary | ICD-10-CM | POA: Diagnosis not present

## 2021-04-26 DIAGNOSIS — I25119 Atherosclerotic heart disease of native coronary artery with unspecified angina pectoris: Secondary | ICD-10-CM | POA: Diagnosis not present

## 2021-04-26 DIAGNOSIS — U071 COVID-19: Secondary | ICD-10-CM

## 2021-04-26 NOTE — Patient Instructions (Signed)
An order was sent to  local [place] on Feb 23 to replace your old [place] CPAP machine.   Cardiology - Dr Martinique- will follow up on the congestive heart failure and atrial fibrillation you had when you went into the hospital with your Covid infection.  Order- CXR   dx post Covid infection, CHF  Please call if we can help

## 2021-05-10 ENCOUNTER — Encounter: Payer: Self-pay | Admitting: Internal Medicine

## 2021-05-10 ENCOUNTER — Ambulatory Visit: Payer: Medicare Other | Admitting: Cardiology

## 2021-05-10 NOTE — Assessment & Plan Note (Signed)
Orders have been sent specifying settings for replacement for his recalled DreamStation CPAP machine.  He has benefited from CPAP. ?

## 2021-05-10 NOTE — Assessment & Plan Note (Signed)
Appropriate to check status after apparent clinical recovery. ?Plan-chest x-ray ?

## 2021-05-10 NOTE — Assessment & Plan Note (Signed)
He is not in overt heart failure at this visit. ?Plan-continue following with cardiology. ?

## 2021-06-24 ENCOUNTER — Telehealth: Payer: Self-pay | Admitting: Internal Medicine

## 2021-06-25 NOTE — Telephone Encounter (Signed)
Called patient but he did not answer. Left message for him to call back.  

## 2021-06-26 ENCOUNTER — Encounter: Payer: Self-pay | Admitting: Cardiology

## 2021-06-28 MED ORDER — ROSUVASTATIN CALCIUM 10 MG PO TABS: 5.0000 mg | ORAL_TABLET | ORAL | 2 refills | Status: DC

## 2021-07-07 ENCOUNTER — Telehealth: Payer: Self-pay | Admitting: Internal Medicine

## 2021-07-07 DIAGNOSIS — G4733 Obstructive sleep apnea (adult) (pediatric): Secondary | ICD-10-CM

## 2021-07-08 NOTE — Telephone Encounter (Signed)
Called [place] but he did not answer. Left message for him to call back.  ?

## 2021-07-15 NOTE — Telephone Encounter (Signed)
Attempted to call pt's daughter Elzie Rings but unable to reach. Left message for her to return call.

## 2021-07-16 NOTE — Telephone Encounter (Signed)
Elzie Rings called me back and states now website is showing machine will be sent to pt on 6/30.  She is going to check with them and see if anything is needed from [place] and will call me back.

## 2021-07-16 NOTE — Telephone Encounter (Signed)
Called pt to get fax # for [facility] and to see what info is needed with the order.  Spoke to pt and dtr [name].  She is going to look up info and call me back.

## 2021-07-16 NOTE — Telephone Encounter (Signed)
Patient's daughter, Elzie Rings called back. Patient has been trying to get a new machine for a long time. Patient is currently using his daughters machine. Patient needs order with settings sent to [employer] so he can get a new machine. Patient can be reached on home and mobile phone. [phone] and [phone].   Please advise.

## 2021-07-16 NOTE — Telephone Encounter (Signed)
Called Brad w/ [employer] since that is who we sent a DME order to in February.  Had to leave a VM.

## 2021-07-16 NOTE — Telephone Encounter (Signed)
Closing encounter-  new telephone encounter was created for same reason.

## 2021-07-19 NOTE — Telephone Encounter (Signed)
Pt received new cpap on 5/19 per [name].  Nothing further needed.

## 2021-08-09 ENCOUNTER — Other Ambulatory Visit: Payer: Self-pay

## 2021-08-09 DIAGNOSIS — I480 Paroxysmal atrial fibrillation: Secondary | ICD-10-CM

## 2021-08-09 MED ORDER — APIXABAN 5 MG PO TABS: 5.0000 mg | ORAL_TABLET | Freq: Two times a day (BID) | ORAL | 5 refills | Status: DC

## 2021-08-09 NOTE — Telephone Encounter (Signed)
Prescription refill request for Eliquis received. Indication: Afib  Last office visit: 04/20/21 (Martinique)  Scr: 1.21 (04/04/21)  Age: 81 Weight: 93.7kg  Appropriate dose and refill sent to requested pharmacy.

## 2021-08-20 ENCOUNTER — Other Ambulatory Visit (HOSPITAL_BASED_OUTPATIENT_CLINIC_OR_DEPARTMENT_OTHER): Payer: Self-pay

## 2021-09-15 ENCOUNTER — Telehealth: Payer: Self-pay | Admitting: Cardiology

## 2021-09-15 NOTE — Telephone Encounter (Signed)
Called pt's daughter. She states "Daddy has been having some a. fib episodes. He has been walking and he just gets so tired. He has been on Carvedilol and Eliquis for awhile but I wonder if medications should be adjusted. His SOB episodes have scared me and I think it has scared him too." Pt added to see Laurann Montana next Wednesday.

## 2021-09-15 NOTE — Telephone Encounter (Signed)
Patient c/o Palpitations:  High priority if patient c/o lightheadedness, shortness of breath, or chest pain  How long have you had palpitations/irregular HR/ Afib? Are you having the symptoms now? For a couple of weeks   Are you currently experiencing lightheadedness, SOB or CP? No  Do you have a history of afib (atrial fibrillation) or irregular heart rhythm? Yes  Have you checked your BP or HR? (document readings if available): No  Are you experiencing any other symptoms? Pt's daughter states that he is just acting off. She says he gets really weak after doing hardly anything. She states that she has noticed a difference in him and he finally told her that his heart was racing, but pt states that he can't seem to walk very much like he normally does. Requesting call back.

## 2021-09-21 NOTE — Progress Notes (Unsigned)
Office Visit    Patient Name: [name] [name] Date of Encounter: 09/22/2021  PCP:  Chesley Noon, MD   [place]  Cardiologist:  Peter Martinique, MD  Advanced Practice Provider:  No care team member to display Electrophysiologist:  None      Chief Complaint    [name] [name] is a 81 y.o. 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 Mid LAD FFR 0.89 (not hemodynamically significant). Medical therapy was continued.   Dysuria 08/10/2020   Hyperlipidemia    LBBB (left bundle branch block) 06/2018   Obstructive sleep apnea    Past Surgical History:  Procedure Laterality Date   CARDIAC CATHETERIZATION  05/02/2003   single vessel,moderate stenosis mid LAD   CARDIAC CATHETERIZATION  06/12/2008   continue med. therapy   right shoulder surgery     TOTAL HIP ARTHROPLASTY Left 09/14/2018   Procedure: LEFT TOTAL HIP ARTHROPLASTY ANTERIOR APPROACH;  Surgeon: Mcarthur Rossetti, MD;  Location: [facility];  Service: Orthopedics;  Laterality: Left;    Allergies  Allergies  Allergen Reactions   Enablex [Darifenacin Hydrobromide Er]     PT STATES IT MAKES HIM "BONKERS" AND VERY TIRED   Ceftriaxone Other (See Comments)    Unknown reaction   Atorvastatin Other (See Comments)    Muscle aches/weakness   Ezetimibe Other (See Comments)    Muscle aches/weakness   Levaquin [Levofloxacin] Diarrhea   Metoprolol Rash   Oxybutynin Other (See Comments)    Memory impairment, temperament changes   Sulfamethoxazole Other (See Comments)    Childhood allergy   Sulfasalazine Other (See Comments)    Childhood allergy   Sulfonamide Derivatives Other (See Comments)    Childhood allergy    History of Present Illness    [name] [name]  is a 81 y.o. male with a hx of Aslzheimer's disease, diastolic heart failure, OSA on CPAP, HTN, HLD, moderate nonobstructive CAD on cardiac cath 2010 (FFR on LAD 2010 negative, NSR 06/2018 low risk) , chronic shortness of breath, LBBB, KCD III, atrial fibrillation last seen 04/20/21.  Admission 06/2020 for parotitis treated with antibiotics followed by ENT. Seen in clinic 08/2020 due to dyspnea. Recommended to continue Lasix '20mg'$  daily for mild diastolic dysfunction. Admitted 09/11/20 and treated for UTI.   Admitted 12/2020 with sepsis due to deep neck infection. Agitation and infection resolved with Decadron and abx. EKG with atrial fibrillation. Rate controlled at rest but 140s with activity. Low dose Metoprolol and OAC were discussed but not initiated. Eliquis subsequently started in outpatient setting.   Admitted 03/2021 with AMS and SIRS due to [place]. 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 At follow up 04/20/21 still short of breath with bending or turning which was chronic. No atrial fibrillation during hospitalization.   Daughter contacted the office 09/15/21 noting increased episodes of atrial fib with fatigue, shortness of breath.   He presents today for follow up with his daughter. Notes when he leans over to tie his shoes he feels short of breath and feels his heart racing. Resolves if he straightens up and sits still. Had some dyspnea  and heart racing with washing dishes a couple of days ago. This has been ongoing for several months and has been progressing. NO orthopnea, PND, edema. Wears his compression stockings regularly. One cup off caffeinated coffee in the morning, one glass of sweet tea, and 3-4 glasses of water per day. Adhering to less than 2L fluid intake. Eating regular meals. Walking his driveway for exercise at the prompting of his daughter.   EKGs/Labs/Other Studies Reviewed:   The following studies were  reviewed today:  Echo 04/2020 1. Left ventricular ejection fraction, by estimation, is 60 to 65%. The  left ventricle has normal function. The left ventricle has no regional  wall motion abnormalities. There is mild left ventricular hypertrophy.  Left ventricular diastolic parameters  are consistent with Grade I diastolic dysfunction (impaired relaxation).   2. Right ventricular systolic function is normal. The right ventricular  size is normal.   3. The mitral valve is normal in structure. Mild mitral valve  regurgitation. No evidence of mitral stenosis.   4. The aortic valve is tricuspid. Aortic valve regurgitation is trivial.  Mild aortic valve sclerosis is present, with no evidence of aortic valve  stenosis.   5. The inferior vena cava is normal in size with greater than 50%  respiratory variability, suggesting right atrial pressure of 3 mmHg.   EKG:  EKG is ordered today.  The ekg ordered today demonstrates NSR 69 bpm with first degree AV block PR 234 and LBBB.  Recent Labs: 04/02/2021: B Natriuretic Peptide 437.5 04/04/2021: ALT 28; BUN 23; Creatinine, Ser 1.21; Hemoglobin 13.2; Magnesium 2.2; Platelets 185; Potassium 3.7; Sodium 138  Recent Lipid Panel No results found for: "CHOL", "TRIG", "HDL", "CHOLHDL", "VLDL", "LDLCALC", "LDLDIRECT"  Risk Assessment/Calculations:   CHA2DS2-VASc Score = 5   This indicates a 7.2% annual risk of stroke. The patient's score is based upon: CHF History: 1 HTN History: 1 Diabetes History: 0 Stroke History: 0 Vascular Disease History: 1 Age Score: 2 Gender Score: 0     Home Medications   Current Meds  Medication Sig   apixaban (ELIQUIS) 5 MG TABS tablet Take 1 tablet (5 mg total) by mouth 2 (two) times daily.   donepezil (ARICEPT) 10 MG tablet Take 10 mg by mouth at bedtime.   doxycycline (VIBRAMYCIN) 100 MG capsule Take 100 mg by mouth 2 (two) times daily.   fesoterodine (TOVIAZ) 8 MG TB24 tablet Take 8 mg by mouth at bedtime.    fluticasone (FLONASE) 50 MCG/ACT nasal spray Place 2 sprays into both nostrils daily as needed for allergies.   furosemide (LASIX) 20 MG tablet Take 1 tablet (20 mg total) by mouth daily.   ketoconazole (NIZORAL) 2 % cream Apply 1 application topically daily.   lisinopril (ZESTRIL) 20 MG tablet Take 20 mg by mouth at bedtime.   mirabegron ER (MYRBETRIQ) 50 MG TB24 tablet Take 50 mg by mouth every morning.   Misc Natural Products (OSTEO BI-FLEX/5-LOXIN ADVANCED) TABS Take 1 tablet by mouth 2 (two) times daily.   Multiple Vitamin (MULTIVITAMIN WITH MINERALS) TABS tablet Take 1 tablet by mouth every morning.   nitrofurantoin, macrocrystal-monohydrate, (MACROBID) 100 MG capsule Take 1 capsule (100 mg total) by mouth in the morning.   Omega-3 Fatty Acids (FISH OIL) 1200 MG CAPS Take 1,200 mg by mouth 2 (two) times daily.   PRESCRIPTION MEDICATION Inhale into the lungs at bedtime. CPAP   rosuvastatin (CRESTOR) 10 MG tablet Take 0.5 tablets (5 mg total) by mouth every other day.   [  DISCONTINUED] carvedilol (COREG) 3.125 MG tablet Take 3.125 mg by mouth 2 (two) times daily.     Review of Systems      All other systems reviewed and are otherwise negative except as noted above.  Physical Exam    VS:  BP 134/78 (BP Location: Right Arm, Patient Position: Sitting, Cuff Size: Normal)   Pulse 69   Ht '5\' 10"'$  (1.778 m)   Wt 205 lb (93 kg)   BMI 29.41 kg/m  , BMI Body mass index is 29.41 kg/m.  Wt Readings from Last 3 Encounters:  09/22/21 205 lb (93 kg)  04/26/21 206 lb 9.6 oz (93.7 kg)  04/20/21 203 lb 3.2 oz (92.2 kg)    GEN: Well nourished, overweight, well developed, in no acute distress. HEENT: normal. Neck: Supple, no JVD, carotid bruits, or masses. Cardiac: RRR, no murmurs, rubs, or gallops. No clubbing, cyanosis, edema.  Radials/PT 2+ and equal bilaterally.  Respiratory:  Respirations regular and unlabored, clear to auscultation bilaterally. GI: Soft, nontender, nondistended. MS: No  deformity or atrophy. Skin: Warm and dry, no rash. Neuro:  Strength and sensation are intact. Psych: Normal affect.  Assessment & Plan    Atrial fibrillation / Hypercoagulable state -EKG today NSR.  Reports of the past few months he has had increasing palpitations associated with shortness of breath with activity such as bending to tie his shoes or doing dishes.  7-day ZIO placed in clinic.  Increase carvedilol to 6.25 mg twice daily.  Lab work today including CBC, BMP, magnesium, thyroid panel. CHA2DS2-VASc Score = 5 [CHF History: 1, HTN History: 1, Diabetes History: 0, Stroke History: 0, Vascular Disease History: 1, Age Score: 2, Gender Score: 0].  Therefore, the patient's annual risk of stroke is 7.2 %.    Continue Eliquis 5 mg twice daily. Does not meet dose reduction criteria.  Denies bleeding complications.  Chronic diastolic heart failure - Weight is stable compared to last clinic visit. No edema, orthopnea, PND. Continue Lasix '20mg'$  QD. Increase Carvedilol due to palpitations, detailed above. Low sodium diet, fluid restriction <2L, and daily weights encouraged. Educated to contact our office for weight gain of 2 lbs overnight or 5 lbs in one week.   Chronic SOB - Echo 04/2020 preserved LVEF, mild DD, no significant valvular issues. Lung sounds clear on exam. Multifactorial diastolic dysfunction, deconditioning.   HTN - BP well controlled.  Continue lisinopril 20 mg daily, Lasix 20 mg daily.  Increase carvedilol to 6.25 mg twice daily due to palpitations, detailed above.  Disposition: Follow up  as scheduled  with Peter Martinique, MD  Signed, Loel Dubonnet, NP 09/22/2021, 8:50 AM [place]

## 2021-09-22 ENCOUNTER — Ambulatory Visit (INDEPENDENT_AMBULATORY_CARE_PROVIDER_SITE_OTHER): Payer: Medicare Other

## 2021-09-22 ENCOUNTER — Ambulatory Visit (INDEPENDENT_AMBULATORY_CARE_PROVIDER_SITE_OTHER): Payer: Medicare Other | Admitting: Family

## 2021-09-22 ENCOUNTER — Encounter (HOSPITAL_BASED_OUTPATIENT_CLINIC_OR_DEPARTMENT_OTHER): Payer: Self-pay | Admitting: Family

## 2021-09-22 VITALS — BP 134/78 | HR 69 | Ht 70.0 in | Wt 205.0 lb

## 2021-09-22 DIAGNOSIS — D6859 Other primary thrombophilia: Secondary | ICD-10-CM

## 2021-09-22 DIAGNOSIS — I10 Essential (primary) hypertension: Secondary | ICD-10-CM

## 2021-09-22 DIAGNOSIS — I480 Paroxysmal atrial fibrillation: Secondary | ICD-10-CM | POA: Diagnosis not present

## 2021-09-22 DIAGNOSIS — R0602 Shortness of breath: Secondary | ICD-10-CM | POA: Diagnosis not present

## 2021-09-22 DIAGNOSIS — I5032 Chronic diastolic (congestive) heart failure: Secondary | ICD-10-CM

## 2021-09-22 MED ORDER — CARVEDILOL 6.25 MG PO TABS: 6.2500 mg | ORAL_TABLET | Freq: Two times a day (BID) | ORAL | 1 refills | Status: DC

## 2021-09-22 NOTE — Patient Instructions (Addendum)
Medication Instructions:  Your physician has recommended you make the following change in your medication:   INCREASE Carvedilol to 6.'25mg'$  twice daily  *If you need a refill on your cardiac medications before your next appointment, please call your pharmacy*   Lab Work: Your physician recommends that you return for lab work today: thyroid panel, BMP, magnesium, CBC  If you have labs (blood work) drawn today and your tests are completely normal, you will receive your results only by: [place] (if you have MyChart) OR A paper copy in the mail If you have any lab test that is abnormal or we need to change your treatment, we will call you to review the results.   Testing/Procedures: Your physician has recommended that you wear a Zio monitor.   This monitor is a medical device that records the heart's electrical activity. Doctors most often use these monitors to diagnose arrhythmias. Arrhythmias are problems with the speed or rhythm of the heartbeat. The monitor is a small device applied to your chest. You can wear one while you do your normal daily activities. While wearing this monitor if you have any symptoms to push the button and record what you felt. Once you have worn this monitor for the period of time provider prescribed (7 days), you will return the monitor device in the postage paid box. Once it is returned they will download the data collected and provide [place] with a report which the provider will then review and we will call you with those results. Important tips:  Avoid showering during the first 24 hours of wearing the monitor. Avoid excessive sweating to help maximize wear time. Do not submerge the device, no hot tubs, and no swimming pools. Keep any lotions or oils away from the patch. After 24 hours you may shower with the patch on. Take brief showers with your back facing the shower head.  Do not remove patch once it has been placed because that will interrupt data and  decrease adhesive wear time. Push the button when you have any symptoms and write down what you were feeling. Once you have completed wearing your monitor, remove and place into box which has postage paid and place in your outgoing mailbox.  If for some reason you have misplaced your box then call our office and we can provide another box and/or mail it off for you.      Follow-Up: At [facility], you and your health needs are our priority.  As part of our continuing mission to provide you with exceptional heart care, we have created designated Provider Care Teams.  These Care Teams include your primary Cardiologist (physician) and Advanced Practice Providers (APPs -  Physician Assistants and Nurse Practitioners) who all work together to provide you with the care you need, when you need it.  We recommend signing up for the patient portal called "MyChart".  Sign up information is provided on this After Visit Summary.  MyChart is used to connect with patients for Virtual Visits (Telemedicine).  Patients are able to view lab/test results, encounter notes, upcoming appointments, etc.  Non-urgent messages can be sent to your provider as well.   To learn more about what you can do with MyChart, go to [email or web address].    Your next appointment:   As scheduled   Other Instructions  To prevent palpitations: Make sure you are adequately hydrated.  Avoid and/or limit caffeine containing beverages like soda or tea. Exercise regularly.  Manage stress well. Some over the  counter medications can cause palpitations such as Benadryl, AdvilPM, TylenolPM. Regular Advil or Tylenol do not cause palpitations.

## 2021-09-23 LAB — BASIC METABOLIC PANEL
BUN/Creatinine Ratio: 19 (ref 10–24)
BUN: 24 mg/dL (ref 8–27)
CO2: 23 mmol/L (ref 20–29)
Calcium: 10.6 mg/dL — ABNORMAL HIGH (ref 8.6–10.2)
Chloride: 103 mmol/L (ref 96–106)
Creatinine, Ser: 1.29 mg/dL — ABNORMAL HIGH (ref 0.76–1.27)
Glucose: 146 mg/dL — ABNORMAL HIGH (ref 70–99)
Potassium: 4.3 mmol/L (ref 3.5–5.2)
Sodium: 140 mmol/L (ref 134–144)
eGFR: 55 mL/min/{1.73_m2} — ABNORMAL LOW (ref >59)

## 2021-09-23 LAB — THYROID PANEL WITH TSH
Free Thyroxine Index: 1.8 (ref 1.2–4.9)
T3 Uptake Ratio: 35 % (ref 24–39)
T4, Total: 5.1 ug/dL (ref 4.5–12.0)
TSH: 1.84 u[IU]/mL (ref 0.450–4.500)

## 2021-09-23 LAB — CBC
Hematocrit: 41.4 % (ref 37.5–51.0)
Hemoglobin: 14.3 g/dL (ref 13.0–17.7)
MCH: 32.4 pg (ref 26.6–33.0)
MCHC: 34.5 g/dL (ref 31.5–35.7)
MCV: 94 fL (ref 79–97)
Platelets: 253 10*3/uL (ref 150–450)
RBC: 4.42 x10E6/uL (ref 4.14–5.80)
RDW: 12.6 % (ref 11.6–15.4)
WBC: 5.1 10*3/uL (ref 3.4–10.8)

## 2021-09-23 LAB — MAGNESIUM: Magnesium: 2.2 mg/dL (ref 1.6–2.3)

## 2021-09-24 ENCOUNTER — Telehealth (HOSPITAL_BASED_OUTPATIENT_CLINIC_OR_DEPARTMENT_OTHER): Payer: Self-pay

## 2021-09-24 DIAGNOSIS — I480 Paroxysmal atrial fibrillation: Secondary | ICD-10-CM

## 2021-09-24 MED ORDER — CARVEDILOL 3.125 MG PO TABS: 3.1250 mg | ORAL_TABLET | Freq: Two times a day (BID) | ORAL | 3 refills | Status: DC

## 2021-09-24 NOTE — Telephone Encounter (Signed)
Daughter updated and verbalized understanding.  Med list updated

## 2021-09-24 NOTE — Telephone Encounter (Signed)
-  Called pt regarding lab results -Daughter made aware and voiced she was glad we called because pt didn't feel well last night. -Daughter reported pt c/o SOB with exertion (no swelling or significant weight increase), fatigue, and weakness. -She stated during dinner, pt kept saying he didn't feel well and was very tired. She also reported he attempted to wash few dishes, but was unable to due to lack of energy. She noted he also didn't shower last night, as he mentioned he didn't have the energy to stand  -Not vitals to report    -Daughter reported pt didn't take any of his night medication (Carvedilol, Eliquis, Aricept) and this morning she had him hold his lasix and decreased carvedilol back to 3.125 mg) -Daughter stated pt feels much better this morning but was very concerned about him last night   Will forward to NP for recommendations   [name] Dubonnet, NP  Gerald Stabs, RN CBC with no evidence of anemia nor infection.  Normal thyroid, electrolytes. Kidney function just slightly decreased from previous. Likely some dehydration. Would hold Lasix for 1 day then resume at same dose '20mg'$  daily.

## 2021-09-24 NOTE — Telephone Encounter (Signed)
Recommend AGAINST holding Eliquis as it can lead to blood clot and stroke due to his atrial fibrillation. Very important he take this twice per day as prescribed.   Okay to reduce Coreg to 3.'125mg'$  BID. Continue to wear monitor for total of one week as discussed in clinic. Follow up as scheduled with Dr. Martinique.   Recommend only holding Lasix if BP <110/60. If held on a regular basis can lead to fluid retention which will worsen shortness of breath.   Please monitor BP at home at least once per day two hours after medications and keep a log.   [name] Dubonnet, NP

## 2021-09-25 ENCOUNTER — Telehealth: Payer: Self-pay | Admitting: Cardiology

## 2021-09-25 NOTE — Telephone Encounter (Signed)
Patient's daughter called stating that patient is feeling weak and feels heart is racing and VS were 148/81 with HR 68bpm  and O2 sats 98%.  His daughter has called multiple times with similar sx over the past week and now is wearing a Ziopatch for 1 week.  Instructed patient to call Dr. Doug Sou office on Monday.  I do not think there is any reason to go to ER tonight unless HR becomes faster.

## 2021-09-29 ENCOUNTER — Encounter (HOSPITAL_BASED_OUTPATIENT_CLINIC_OR_DEPARTMENT_OTHER): Payer: Self-pay

## 2021-09-29 NOTE — Progress Notes (Signed)
HPI male former smoker followed for OSA, complicated by CAD, CHF, venous stasis dermatitis, chronic renal disease PFT: 12/16/2011-within normal limits. Small airway flows were quite normal but improved with bronchodilator. FEV1 3.03/107%, FEV1/FVC 0.82, DLCO 0.89. NPSG 12/25/91- AHI 19.3/ hr, desat to 66%, body weight 206 lbs ------------------------------------------------------------------------------------------------ 04/26/21- 81 year old male former smoker followed for OSA, complicated by CAD/ CHF, chronic renal disease, Dementia/ Alzheimers, GERD with esophagitis, Venous stasis dermatititis, Covid infection [id number],  CPAP auto 5-15/[employer]) new machine in 2020> recalled     Replacement ordered 04/20/21 Download- daughter's old machine- no download Body weight today-206 Covid vax-3 Moderna               Flu vax-had Hosp Feb Covid infection+> Remdesivir, complicated by CHF, AFib on admission.  Here alone but daughter Elzie Rings has sent a note asking that we send the correct CPAP settings to [facility] for his replacement machine after previous CPAP machine was recalled.  That order has been sent February 21. He describes breathing is stable.  This exam is clear now with regular pulse but note that BNP was 437 on February 3. CTa chest 04/02/21- IMPRESSION: 1. Negative for acute PE or thoracic aortic dissection. 2. Coronary and Aortic Atherosclerosis (ICD10-170.0). 3. Right paratracheal and subcarinal adenopathy of indeterminate etiology. 4. Dependent alveolar opacities in both lungs may represent, edema versus infiltrate. 5. 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 Visceral aneurysms greater than 2 cm are at increased risk for rupture. 6. Coronary and Aortic Atherosclerosis (ICD10-170.0).  10/01/21- 81 year old male former smoker followed for OSA, complicated by CAD/ CHF, chronic renal disease, Dementia/ Alzheimers, GERD with esophagitis, Venous stasis dermatititis, Covid infection  [id number],  PAFib,  CPAP auto 5-15/[employer] ([place])Replaced 07/16/21 Download- Body weight today- Covid vax-3 Moderna  -----He is doing well with CPAP and has noticed increased shortness of breath and per daughter it may be due to CHF progressing.  He does not think he can sleep without CPAP.  Recently got replacement for old [employer] machine and we need to add download capability. Cardiology following for atrial fibrillation and CHF with some dyspnea on exertion but no cough or wheeze. He is a former smoker and we discussed getting a PFT for documentation.  This will also be useful for cardiologists. CXR 04/27/21- IMPRESSION: No active cardiopulmonary disease.  ROS-see HPI  + = positive Constitutional:   No-   weight loss, night sweats, fevers, chills, fatigue, lassitude. HEENT:   No-  headaches, difficulty swallowing, tooth/dental problems, sore throat,       No-  sneezing, itching, ear ache, nasal congestion, post nasal drip,  CV:  No-   chest pain, orthopnea, PND, swelling in lower extremities, anasarca,                                           dizziness, palpitations Resp: + shortness of breath with exertion or at rest.              No-   productive cough,  No non-productive cough,  No- coughing up of blood.              No-   change in color of mucus.  No- wheezing.   Skin: No-   rash or lesions. GI:   GU:  MS:  No-   joint pain or swelling.   Neuro-     Memory decline- Alzheimers  Psych:  No- change in mood or affect. No depression or anxiety.  + memory loss.  OBJ General- Alert, Oriented, Affect-appropriate, Distress- none acute. +Obese Skin- rash-none, lesions- none, excoriation- none Lymphadenopathy- none Head- atraumatic            Eyes- Gross vision intact, PERRLA, +Strabismus, conjunctivae clear secretions            Ears- +Hearing aids, + scarring both TMs            Nose- Clear, no-Septal dev, mucus, polyps, erosion, perforation             Throat- Mallampati IV ,  mucosa clear , drainage- none, tonsils- atrophic Neck- flexible , trachea midline, no stridor , thyroid nl, carotid no bruit Chest - symmetrical excursion , unlabored           Heart/CV- RRR ,  Murmur+trace S , no gallop  , no rub, nl s1 s2                           - JVD- none , edema- none, stasis changes- none, varices- none           Lung- clear to P&A, wheeze- none, cough- none , dullness-none, rub- none           Chest wall-  Abd-  Br/ Gen/ Rectal- Not done, not indicated Extrem- + elastic hose, cane Neuro- grossly intact to observation

## 2021-10-01 ENCOUNTER — Ambulatory Visit (INDEPENDENT_AMBULATORY_CARE_PROVIDER_SITE_OTHER): Payer: Medicare Other | Admitting: Internal Medicine

## 2021-10-01 ENCOUNTER — Encounter: Payer: Self-pay | Admitting: Internal Medicine

## 2021-10-01 VITALS — BP 120/76 | HR 65 | Temp 97.7°F | Ht 70.0 in | Wt 203.6 lb

## 2021-10-01 DIAGNOSIS — G4733 Obstructive sleep apnea (adult) (pediatric): Secondary | ICD-10-CM

## 2021-10-01 DIAGNOSIS — Z9989 Dependence on other enabling machines and devices: Secondary | ICD-10-CM | POA: Diagnosis not present

## 2021-10-01 DIAGNOSIS — I25119 Atherosclerotic heart disease of native coronary artery with unspecified angina pectoris: Secondary | ICD-10-CM | POA: Diagnosis not present

## 2021-10-01 DIAGNOSIS — R0609 Other forms of dyspnea: Secondary | ICD-10-CM | POA: Diagnosis not present

## 2021-10-01 NOTE — Assessment & Plan Note (Signed)
Benefits from CPAP and he does not think he can sleep without it.  Verified by daughter. Plan-continue CPAP auto 10-20.  DME company to add download capability to current machine.

## 2021-10-01 NOTE — Assessment & Plan Note (Signed)
Cardiology factors are likely to be dominant and he does not have cough or wheeze. Plan-schedule PFT for documentation

## 2021-10-01 NOTE — Patient Instructions (Signed)
We can continue CPAP auto 5-15  Order- DME [employer]- please install AirView or equivalent, or install SD card for download capability.  Order- schedule PFT   dx Dyspnea on exertion

## 2021-10-06 ENCOUNTER — Encounter: Payer: Self-pay | Admitting: Cardiology

## 2021-10-08 NOTE — Progress Notes (Unsigned)
Office Visit    Patient Name: [name] [name] Date of Encounter: 10/13/2021  PCP:  Chesley Noon, MD   [place]  Cardiologist:  Lion Fernandez Martinique, MD  Advanced Practice Provider:  No care team member to display Electrophysiologist:  None      Chief Complaint    [name] [name] is a 81 y.o. 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 Mid LAD FFR 0.89 (not hemodynamically significant). Medical therapy was continued.   Dysuria 08/10/2020   Hyperlipidemia    LBBB (left bundle branch block) 06/2018   Obstructive sleep apnea    Past Surgical History:  Procedure Laterality Date   CARDIAC CATHETERIZATION  05/02/2003   single vessel,moderate stenosis mid LAD   CARDIAC CATHETERIZATION  06/12/2008   continue med. therapy   right shoulder surgery     TOTAL HIP ARTHROPLASTY Left 09/14/2018   Procedure: LEFT TOTAL HIP ARTHROPLASTY ANTERIOR APPROACH;  Surgeon: Mcarthur Rossetti, MD;  Location: [facility];  Service: Orthopedics;  Laterality: Left;    Allergies  Allergies  Allergen Reactions   Enablex [Darifenacin Hydrobromide Er]     PT STATES IT MAKES HIM "BONKERS" AND VERY TIRED   Ceftriaxone Other (See Comments)    Unknown reaction   Atorvastatin Other (See Comments)    Muscle aches/weakness   Ezetimibe Other (See Comments)    Muscle aches/weakness   Levaquin [Levofloxacin] Diarrhea   Metoprolol Rash   Oxybutynin Other (See Comments)    Memory impairment, temperament changes   Sulfamethoxazole Other (See Comments)    Childhood allergy   Sulfasalazine Other (See Comments)    Childhood allergy   Sulfonamide Derivatives Other (See Comments)    Childhood allergy    History of Present Illness    [name] [name]  is a 81 y.o. male with a hx of Aslzheimer's disease, diastolic heart failure, OSA on CPAP, HTN, HLD, moderate nonobstructive CAD on cardiac cath 2010 (FFR on LAD 2010 negative, NSR 06/2018 low risk) , chronic shortness of breath, LBBB, KCD III, atrial fibrillation last seen 04/20/21.  Admission 06/2020 for parotitis treated with antibiotics followed by ENT. Seen in clinic 08/2020 due to dyspnea. Recommended to continue Lasix '20mg'$  daily for mild diastolic dysfunction. Admitted 09/11/20 and treated for UTI.   Admitted 12/2020 with sepsis due to deep neck infection. Agitation and infection resolved with Decadron and abx. EKG with atrial fibrillation. Rate controlled at rest but 140s with activity. Low dose Metoprolol and OAC were discussed but not initiated. Eliquis subsequently started in outpatient setting.   Admitted 03/2021 with AMS and SIRS due to [place]. 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 At follow up 04/20/21 still short of breath with bending or turning which was chronic. No atrial fibrillation during hospitalization.   Daughter contacted the office 09/15/21 noting increased episodes of atrial fib with fatigue, shortness of breath. Was seen by Laurann Montana NP on 7/26 and was in NSR. Labs were done and were OK. A 2 week event monitor was placed. The results of the monitor are still pending.  He presents today for follow up with  his daughter. Notes when he leans over to tie his shoes he feels short of breath and feels his heart racing. Sometimes will have sudden weak spells. No chest pain. He did not tolerate the higher dose of Coreg due to weakness and went back to taking 3.125 mg bid.   EKGs/Labs/Other Studies Reviewed:   The following studies were reviewed today:  Echo 04/2020 1. Left ventricular ejection fraction, by estimation, is 60 to 65%. The  left ventricle has normal function. The left ventricle has no regional   wall motion abnormalities. There is mild left ventricular hypertrophy.  Left ventricular diastolic parameters  are consistent with Grade I diastolic dysfunction (impaired relaxation).   2. Right ventricular systolic function is normal. The right ventricular  size is normal.   3. The mitral valve is normal in structure. Mild mitral valve  regurgitation. No evidence of mitral stenosis.   4. The aortic valve is tricuspid. Aortic valve regurgitation is trivial.  Mild aortic valve sclerosis is present, with no evidence of aortic valve  stenosis.   5. The inferior vena cava is normal in size with greater than 50%  respiratory variability, suggesting right atrial pressure of 3 mmHg.   EKG:  EKG is not ordered today.  The ekg ordered today demonstrates NSR 69 bpm with first degree AV block PR 234 and LBBB.  Recent Labs: 04/02/2021: B Natriuretic Peptide 437.5 04/04/2021: ALT 28 09/22/2021: BUN 24; Creatinine, Ser 1.29; Hemoglobin 14.3; Magnesium 2.2; Platelets 253; Potassium 4.3; Sodium 140; TSH 1.840  Recent Lipid Panel No results found for: "CHOL", "TRIG", "HDL", "CHOLHDL", "VLDL", "LDLCALC", "LDLDIRECT"  Risk Assessment/Calculations:   CHA2DS2-VASc Score = 5   This indicates a 7.2% annual risk of stroke. The patient's score is based upon: CHF History: 1 HTN History: 1 Diabetes History: 0 Stroke History: 0 Vascular Disease History: 1 Age Score: 2 Gender Score: 0     Home Medications   Current Meds  Medication Sig   apixaban (ELIQUIS) 5 MG TABS tablet Take 1 tablet (5 mg total) by mouth 2 (two) times daily.   carvedilol (COREG) 3.125 MG tablet Take 1 tablet (3.125 mg total) by mouth 2 (two) times daily.   clindamycin (CLEOCIN) 300 MG capsule Take 300 mg by mouth 3 (three) times daily. Uses TID with flares   donepezil (ARICEPT) 10 MG tablet Take 10 mg by mouth at bedtime.   doxycycline (VIBRAMYCIN) 100 MG capsule Take 100 mg by mouth 2 (two) times daily.   fesoterodine (TOVIAZ) 8  MG TB24 tablet Take 8 mg by mouth at bedtime.   fluticasone (FLONASE) 50 MCG/ACT nasal spray Place 2 sprays into both nostrils daily as needed for allergies.   furosemide (LASIX) 20 MG tablet Take 1 tablet (20 mg total) by mouth daily.   ketoconazole (NIZORAL) 2 % cream Apply 1 application topically daily.   lisinopril (ZESTRIL) 20 MG tablet Take 20 mg by mouth at bedtime.   mirabegron ER (MYRBETRIQ) 50 MG TB24 tablet Take 50 mg by mouth every morning.   Misc Natural Products (OSTEO BI-FLEX/5-LOXIN ADVANCED) TABS Take 1 tablet by mouth 2 (two) times daily.   Multiple Vitamin (MULTIVITAMIN WITH MINERALS) TABS tablet Take 1 tablet by mouth every morning.   nitrofurantoin, macrocrystal-monohydrate, (MACROBID) 100 MG capsule Take 1 capsule (100 mg total) by mouth in the morning.   Omega-3 Fatty Acids (FISH OIL) 1200 MG CAPS Take 1,200 mg by mouth 2 (two) times daily.   PRESCRIPTION MEDICATION Inhale into the lungs at bedtime.  CPAP   rosuvastatin (CRESTOR) 10 MG tablet Take 0.5 tablets (5 mg total) by mouth every other day.     Review of Systems      All other systems reviewed and are otherwise negative except as noted above.  Physical Exam    VS:  BP 135/78   Pulse 76   Resp 20   Ht '5\' 10"'$  (1.778 m)   Wt 205 lb 6.4 oz (93.2 kg)   SpO2 96%   BMI 29.47 kg/m  , BMI Body mass index is 29.47 kg/m.  Wt Readings from Last 3 Encounters:  10/13/21 205 lb 6.4 oz (93.2 kg)  10/01/21 203 lb 9.6 oz (92.4 kg)  09/22/21 205 lb (93 kg)    GEN: Well nourished, overweight, well developed, in no acute distress. HEENT: normal. Neck: Supple, no JVD, carotid bruits, or masses. Cardiac: RRR, no murmurs, rubs, or gallops. No clubbing, cyanosis, edema.  Radials/PT 2+ and equal bilaterally.  Respiratory:  Respirations regular and unlabored, clear to auscultation bilaterally. GI: Soft, nontender, nondistended. MS: No deformity or atrophy. Skin: Warm and dry, no rash. Neuro:  Strength and sensation are  intact. Psych: Normal affect.  Assessment & Plan    Atrial fibrillation / Hypercoagulable state  Had Afib during hospitalization in November when septic. No documented recurrence since then. Recent labs ok. Awaiting results of monitor. If he has recurrent Afib on monitor with high burden would consider AAD therapy. If low burden would reassure and continue current therapy.   CHA2DS2-VASc Score = 5 [CHF History: 1, HTN History: 1, Diabetes History: 0, Stroke History: 0, Vascular Disease History: 1, Age Score: 2, Gender Score: 0].  Therefore, the patient's annual risk of stroke is 7.2 %.    Continue Eliquis 5 mg twice daily. Does not meet dose reduction criteria.  Denies bleeding complications.  Chronic diastolic heart failure - Weight is stable compared to last clinic visit. No edema, orthopnea, PND. Continue Lasix '20mg'$  QD.  Low sodium diet, fluid restriction <2L, and daily weights encouraged.   Chronic SOB - Echo 04/2020 preserved LVEF, mild DD, no significant valvular issues. Lung sounds clear on exam. Multifactorial diastolic dysfunction, deconditioning.   HTN - BP well controlled.    Disposition: Follow up 4 months with Chauncey Sciulli Martinique, MD  Signed, Tziporah Knoke Martinique, MD 10/13/2021, 9:57 AM [place]

## 2021-10-13 ENCOUNTER — Ambulatory Visit (INDEPENDENT_AMBULATORY_CARE_PROVIDER_SITE_OTHER): Payer: Medicare Other | Admitting: Cardiology

## 2021-10-13 ENCOUNTER — Encounter: Payer: Self-pay | Admitting: Cardiology

## 2021-10-13 VITALS — BP 135/78 | HR 76 | Resp 20 | Ht 70.0 in | Wt 205.4 lb

## 2021-10-13 DIAGNOSIS — I480 Paroxysmal atrial fibrillation: Secondary | ICD-10-CM | POA: Diagnosis not present

## 2021-10-13 DIAGNOSIS — I10 Essential (primary) hypertension: Secondary | ICD-10-CM | POA: Diagnosis not present

## 2021-10-13 DIAGNOSIS — I25119 Atherosclerotic heart disease of native coronary artery with unspecified angina pectoris: Secondary | ICD-10-CM

## 2021-10-13 DIAGNOSIS — I5032 Chronic diastolic (congestive) heart failure: Secondary | ICD-10-CM | POA: Diagnosis not present

## 2021-10-14 ENCOUNTER — Telehealth: Payer: Self-pay

## 2021-10-14 NOTE — Telephone Encounter (Signed)
Called patient left message on personal voice mail.Dr.Jordan reviewed monitor.No Afib,some brief runs of SVT and some junctional rhythm.Did not tolerate increased dose of coreg in the past.He advised continue same medications.

## 2021-10-18 ENCOUNTER — Ambulatory Visit: Payer: Medicare Other | Admitting: Cardiology

## 2021-10-25 ENCOUNTER — Ambulatory Visit: Payer: Medicare Other | Admitting: Internal Medicine

## 2021-12-23 ENCOUNTER — Other Ambulatory Visit: Payer: Self-pay

## 2021-12-23 ENCOUNTER — Emergency Department (HOSPITAL_BASED_OUTPATIENT_CLINIC_OR_DEPARTMENT_OTHER)
Admission: EM | Admit: 2021-12-23 | Discharge: 2021-12-24 | Disposition: A | Discharge status: Home / Self Care | Payer: Medicare Other | Attending: Emergency Medicine | Admitting: Emergency Medicine

## 2021-12-23 ENCOUNTER — Emergency Department (HOSPITAL_BASED_OUTPATIENT_CLINIC_OR_DEPARTMENT_OTHER): Payer: Medicare Other | Admitting: Radiology

## 2021-12-23 ENCOUNTER — Encounter (HOSPITAL_BASED_OUTPATIENT_CLINIC_OR_DEPARTMENT_OTHER): Payer: Self-pay

## 2021-12-23 DIAGNOSIS — Z7901 Long term (current) use of anticoagulants: Secondary | ICD-10-CM | POA: Insufficient documentation

## 2021-12-23 DIAGNOSIS — Z20822 Contact with and (suspected) exposure to covid-19: Secondary | ICD-10-CM | POA: Diagnosis not present

## 2021-12-23 DIAGNOSIS — N189 Chronic kidney disease, unspecified: Secondary | ICD-10-CM | POA: Insufficient documentation

## 2021-12-23 DIAGNOSIS — R5383 Other fatigue: Secondary | ICD-10-CM | POA: Diagnosis not present

## 2021-12-23 DIAGNOSIS — I509 Heart failure, unspecified: Secondary | ICD-10-CM | POA: Diagnosis not present

## 2021-12-23 DIAGNOSIS — F0390 Unspecified dementia without behavioral disturbance: Secondary | ICD-10-CM | POA: Diagnosis not present

## 2021-12-23 DIAGNOSIS — R0602 Shortness of breath: Secondary | ICD-10-CM | POA: Insufficient documentation

## 2021-12-23 DIAGNOSIS — I2510 Atherosclerotic heart disease of native coronary artery without angina pectoris: Secondary | ICD-10-CM | POA: Diagnosis not present

## 2021-12-23 LAB — BASIC METABOLIC PANEL
Anion gap: 11 (ref 5–15)
BUN: 32 mg/dL — ABNORMAL HIGH (ref 8–23)
CO2: 23 mmol/L (ref 22–32)
Calcium: 10.8 mg/dL — ABNORMAL HIGH (ref 8.9–10.3)
Chloride: 105 mmol/L (ref 98–111)
Creatinine, Ser: 1.34 mg/dL — ABNORMAL HIGH (ref 0.61–1.24)
GFR, Estimated: 53 mL/min — ABNORMAL LOW (ref >60)
Glucose, Bld: 147 mg/dL — ABNORMAL HIGH (ref 70–99)
Potassium: 4.2 mmol/L (ref 3.5–5.1)
Sodium: 139 mmol/L (ref 135–145)

## 2021-12-23 LAB — RESP PANEL BY RT-PCR (FLU A&B, COVID) ARPGX2
Influenza A by PCR: NEGATIVE
Influenza B by PCR: NEGATIVE
SARS Coronavirus 2 by RT PCR: NEGATIVE

## 2021-12-23 LAB — CBC
HCT: 39.7 % (ref 39.0–52.0)
Hemoglobin: 13.1 g/dL (ref 13.0–17.0)
MCH: 31.2 pg (ref 26.0–34.0)
MCHC: 33 g/dL (ref 30.0–36.0)
MCV: 94.5 fL (ref 80.0–100.0)
Platelets: 287 10*3/uL (ref 150–400)
RBC: 4.2 MIL/uL — ABNORMAL LOW (ref 4.22–5.81)
RDW: 12.7 % (ref 11.5–15.5)
WBC: 6.7 10*3/uL (ref 4.0–10.5)
nRBC: 0 % (ref 0.0–0.2)

## 2021-12-23 LAB — TROPONIN I (HIGH SENSITIVITY)
Troponin I (High Sensitivity): 9 ng/L (ref <18)
Troponin I (High Sensitivity): 8 ng/L (ref <18)

## 2021-12-23 LAB — BRAIN NATRIURETIC PEPTIDE: B Natriuretic Peptide: 285.7 pg/mL — ABNORMAL HIGH (ref 0.0–100.0)

## 2021-12-23 NOTE — ED Provider Notes (Signed)
[facility] EMERGENCY DEPT Provider Note   CSN: [id number] Arrival date & time: 12/23/21  1913     History  Chief Complaint  Patient presents with   Shortness of Breath    [name] [name] is a 81 y.o. male.  Patient is an 81 year old male with a past medical history of dementia, A-fib on Eliquis, CAD, CHF, OSA on CPAP and CKD presenting to the emergency department with shortness of breath and fatigue.  Patient is here with his daughter who is his caretaker and states that he has been dealing with chronic shortness of breath for several months.  She states that she had him be evaluated by cardiology and pulmonology in August and has his next pulmonology appointment for PFTs next month.  She states that he has also been seeing his primary doctor but nobody has been able to figure out the cause of his symptoms.  She states that over the last few days his shortness of breath is seem to get worse.  He reports shortness of breath with tying his shoes.  He states he is able to ambulate to the end of the driveway but is feeling short of breath when he reaches the end of the driveway.  He denies any chest pain, lightheadedness or dizziness.  He denies any nausea, vomiting or diarrhea, black or bloody stools.  He states that he had a decreased appetite today but otherwise has been eating well.  He denies any fevers or chills, cough or congestion or sore throat.  He denies any lower extremity swelling.  The history is provided by the patient and a relative.  Shortness of Breath      Home Medications Prior to Admission medications   Medication Sig Start Date End Date Taking? Authorizing Provider  apixaban (ELIQUIS) 5 MG TABS tablet Take 1 tablet (5 mg total) by mouth 2 (two) times daily. 08/09/21   Martinique, Peter M, MD  carvedilol (COREG) 3.125 MG tablet Take 1 tablet (3.125 mg total) by mouth 2 (two) times daily. 09/24/21   Loel Dubonnet, NP  clindamycin (CLEOCIN) 300 MG capsule  Take 300 mg by mouth 3 (three) times daily. Uses TID with flares 10/01/21   [provider]  donepezil (ARICEPT) 10 MG tablet Take 10 mg by mouth at bedtime. 12/01/16   [provider]  doxycycline (VIBRAMYCIN) 100 MG capsule Take 100 mg by mouth 2 (two) times daily. 08/14/21   [provider]  fesoterodine (TOVIAZ) 8 MG TB24 tablet Take 8 mg by mouth at bedtime. 12/13/18   [provider]  fluticasone (FLONASE) 50 MCG/ACT nasal spray Place 2 sprays into both nostrils daily as needed for allergies. 05/15/17   [provider]  furosemide (LASIX) 20 MG tablet Take 1 tablet (20 mg total) by mouth daily. 09/07/20   Sande Rives E, PA-C  ketoconazole (NIZORAL) 2 % cream Apply 1 application topically daily. 02/27/21   [provider]  lisinopril (ZESTRIL) 20 MG tablet Take 20 mg by mouth at bedtime. 08/23/18   [provider]  mirabegron ER (MYRBETRIQ) 50 MG TB24 tablet Take 50 mg by mouth every morning.    [provider]  Misc Natural Products (OSTEO BI-FLEX/5-LOXIN ADVANCED) TABS Take 1 tablet by mouth 2 (two) times daily.    [provider]  Multiple Vitamin (MULTIVITAMIN WITH MINERALS) TABS tablet Take 1 tablet by mouth every morning.    [provider]  nitrofurantoin, macrocrystal-monohydrate, (MACROBID) 100 MG capsule Take 1 capsule (100  mg total) by mouth in the morning. 01/23/21   Mercy Riding, MD  Omega-3 Fatty Acids (FISH OIL) 1200 MG CAPS Take 1,200 mg by mouth 2 (two) times daily.    [provider]  PRESCRIPTION MEDICATION Inhale into the lungs at bedtime. CPAP    [provider]  rosuvastatin (CRESTOR) 10 MG tablet Take 0.5 tablets (5 mg total) by mouth every other day. 06/28/21   Martinique, Peter M, MD      Allergies    Enablex [darifenacin hydrobromide er], Ceftriaxone, Atorvastatin, Ezetimibe, Levaquin [levofloxacin], Metoprolol, Oxybutynin, Sulfamethoxazole, Sulfasalazine, and  Sulfonamide derivatives    Review of Systems   Review of Systems  Respiratory:  Positive for shortness of breath.     Physical Exam Updated Vital Signs BP 127/70   Pulse 65   Temp 97.9 F (36.6 C)   Resp 20   Ht '5\' 10"'$  (1.778 m)   Wt 93.2 kg   SpO2 96%   BMI 29.48 kg/m  Physical Exam Vitals and nursing note reviewed.  Constitutional:      General: He is not in acute distress.    Appearance: He is well-developed.  HENT:     Head: Normocephalic and atraumatic.     Mouth/Throat:     Mouth: Mucous membranes are moist.     Pharynx: Oropharynx is clear.  Eyes:     Extraocular Movements: Extraocular movements intact.     Pupils: Pupils are equal, round, and reactive to light.  Neck:     Vascular: No JVD.  Cardiovascular:     Rate and Rhythm: Regular rhythm. Bradycardia present.     Pulses: Normal pulses.     Heart sounds: Normal heart sounds.  Pulmonary:     Effort: Pulmonary effort is normal.     Breath sounds: Normal breath sounds.  Abdominal:     Palpations: Abdomen is soft.     Tenderness: There is no abdominal tenderness.  Musculoskeletal:     Cervical back: Normal range of motion and neck supple.     Right lower leg: No edema.     Left lower leg: No edema.  Skin:    General: Skin is warm and dry.     Findings: No rash.  Neurological:     General: No focal deficit present.     Mental Status: He is alert. He is disoriented.  Psychiatric:        Mood and Affect: Mood normal.        Behavior: Behavior normal.     ED Results / Procedures / Treatments   Labs (all labs ordered are listed, but only abnormal results are displayed) Labs Reviewed  BASIC METABOLIC PANEL - Abnormal; Notable for the following components:      Result Value   Glucose, Bld 147 (*)    BUN 32 (*)    Creatinine, Ser 1.34 (*)    Calcium 10.8 (*)    GFR, Estimated 53 (*)    All other components within normal limits  CBC - Abnormal; Notable for the following components:   RBC 4.20 (*)     All other components within normal limits  BRAIN NATRIURETIC PEPTIDE - Abnormal; Notable for the following components:   B Natriuretic Peptide 285.7 (*)    All other components within normal limits  RESP PANEL BY RT-PCR (FLU A&B, COVID) ARPGX2  TROPONIN I (HIGH SENSITIVITY)  TROPONIN I (HIGH SENSITIVITY)    EKG EKG Interpretation  Date/Time:  Thursday December 23 2021 19:19:42  EDT Ventricular Rate:  56 PR Interval:    QRS Duration: 148 QT Interval:  444 QTC Calculation: 428 R Axis:   13 Text Interpretation: Wide QRS rhythm Left bundle branch block Abnormal ECG  Now in junctional rhythm otherwise no significant change from prior Confirmed by Leanord Asal ([id number]) on 12/23/2021 9:03:37 PM  Radiology DG Chest 2 View  Result Date: 12/23/2021 CLINICAL DATA:  Shortness of breath increased over the past few weeks, initial encounter EXAM: CHEST - 2 VIEW COMPARISON:  04/26/2021 FINDINGS: Cardiac shadow is within normal limits. Aortic calcifications are noted. The lungs are well aerated bilaterally. No focal infiltrate or sizable effusion is seen. Mild degenerative changes of the thoracic spine are noted. IMPRESSION: No acute abnormality noted. Electronically Signed   By: Inez Catalina M.D.   On: 12/23/2021 19:50    Procedures Procedures    Medications Ordered in ED Medications - No data to display  ED Course/ Medical Decision Making/ A&P Clinical Course as of 12/23/21 2330  Thu Dec 23, 2021  2316 Upon reassessment, the patient's troponins remain negative.  BNP is mildly elevated without any evidence of volume overload on exam and viral panel is negative.  The patient's heart rate has been in the 60s for at least the last hour and he is remained at his baseline well-appearing.  Patient is stable for discharge home with primary care, cardiology and nephrology follow-up.  They were given strict return precautions. [VK]    Clinical Course User Index [VK] Kemper Durie, DO                            Medical Decision Making This patient presents to the ED with chief complaint(s) of shortness of breath and fatigue with pertinent past medical history of CAD, A-fib on Eliquis, CHF, OSA, CKD which further complicates the presenting complaint. The complaint involves an extensive differential diagnosis and also carries with it a high risk of complications and morbidity.    The differential diagnosis includes ACS, arrhythmia, electrolyte abnormality, anemia, CHF exacerbation, pneumonia, pulmonary edema, pleural effusion, pneumothorax, PE unlikely as patient is anticoagulated and is not tachycardic, hypoxic or tachypneic  Additional history obtained: Additional history obtained from family Records reviewed Primary Care Documents and outpatient cardiology and pulmonology records  ED Course and Reassessment: Upon patient's arrival to the emergency department he was bradycardic in the 40s and a junctional rhythm.  He otherwise had no acute changes from prior EKG and reviewing cardiology records he does have a history of a junctional rhythm though unclear how bradycardic he has been in the past.  Will have labs including troponin and BNP and chest x-ray performed, viral studies and will be closely reassessed.  Independent labs interpretation:  The following labs were independently interpreted: Labs at baseline with no significant abnormality  Independent visualization of imaging: - I independently visualized the following imaging with scope of interpretation limited to determining acute life threatening conditions related to emergency care: Chest x-ray, which revealed no acute disease  Consultation: - Consulted or discussed management/test interpretation w/ external professional: N/A  Consideration for admission or further workup: Patient has no emergent conditions requiring admission or further inpatient work-up at this time and is stable for discharge home with continued  outpatient work-up by cardiology, pulmonology and primary care Social Determinants of health: N/A    Amount and/or Complexity of Data Reviewed Labs: ordered. Radiology: ordered.  Final Clinical Impression(s) / ED Diagnoses Final diagnoses:  Shortness of breath  Fatigue, unspecified type    Rx / DC Orders ED Discharge Orders     None         Kemper Durie, DO 12/23/21 2331

## 2021-12-23 NOTE — Discharge Instructions (Signed)
You were seen in the emergency department for your shortness of breath and fatigue.  Your work-up showed no signs of heart attack, no fluid on your lungs, no pneumonia, no anemia normal-appearing electrolytes.  Your flu and COVID test were negative.  It is unclear what is causing your symptoms at this time but you should have continued work-up by your primary doctor, cardiology and pulmonology.  Your heart rate was slow when you arrived in the ER in the 40s but it did not seem to affect your symptoms when it was lower when it returned back to the 60s, however you can mention this to your cardiologist to see if they would want to do any further work-up or make any changes to your medications.  You should return to the emergency department if you develop chest pain, you have fevers, you pass out, you are having increased work of breathing or if you have any other new or concerning symptoms.

## 2021-12-23 NOTE — ED Triage Notes (Signed)
Patient here POV from Home with Family.  Endorses SOB present for Years but states it has worsened recently over the past few weeks. No Discernable URI Symptoms. No Pain or Fevers.   NAD Noted during Triage. BIB Wheelchair.

## 2021-12-27 ENCOUNTER — Ambulatory Visit: Payer: Medicare Other | Attending: Physician Assistant | Admitting: Physician Assistant

## 2021-12-27 ENCOUNTER — Encounter: Payer: Self-pay | Admitting: Physician Assistant

## 2021-12-27 VITALS — BP 142/64 | HR 70 | Ht 70.0 in | Wt 204.0 lb

## 2021-12-27 DIAGNOSIS — I10 Essential (primary) hypertension: Secondary | ICD-10-CM

## 2021-12-27 DIAGNOSIS — E785 Hyperlipidemia, unspecified: Secondary | ICD-10-CM

## 2021-12-27 DIAGNOSIS — I2510 Atherosclerotic heart disease of native coronary artery without angina pectoris: Secondary | ICD-10-CM

## 2021-12-27 DIAGNOSIS — R0609 Other forms of dyspnea: Secondary | ICD-10-CM

## 2021-12-27 DIAGNOSIS — I447 Left bundle-branch block, unspecified: Secondary | ICD-10-CM

## 2021-12-27 DIAGNOSIS — I25119 Atherosclerotic heart disease of native coronary artery with unspecified angina pectoris: Secondary | ICD-10-CM | POA: Diagnosis not present

## 2021-12-27 DIAGNOSIS — I480 Paroxysmal atrial fibrillation: Secondary | ICD-10-CM | POA: Diagnosis present

## 2021-12-27 NOTE — Progress Notes (Unsigned)
Cardiology Office Note:    Date:  12/29/2021   ID:  [name] [name], DOB 08-07-1940, MRN [id number]  PCP:  Chesley Noon, MD   [facility] Prov[facility] Cardiologist:  Peter Martinique, MD     Referring MD: Chesley Noon, MD   Chief Complaint  Patient presents with   Follow-up    Post hospital.   Shortness of Breath    History of Present Illness:    [name] [name] is a 81 y.o. male with a hx of HTN, HLD, moderate CAD on cath 2010, chronic DOE, LBBB, chronic diastolic heart failure, Alzheimer's disease, CKD stage III and A-fib.  Myoview in May 2020 was low risk.  Patient was admitted in May 2022 with peritonitis treated with antibiotic by ENT.  He was seen in the clinic in July 2022 due to dyspnea and I recommended to continue Lasix 20 mg daily for mild diastolic dysfunction.  He was readmitted with sepsis due to a deep neck infection in November 2022.  EKG at the time revealed atrial fibrillation.  Low-dose metoprolol and anticoagulation was discussed but not initiated.  Eliquis was later initiated in the outpatient setting.  Patient was then readmitted in February 2023 with altered mental status and SIRS due to [place].  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  Vascular surgery recommended follow-up CT imaging in 1 year due to low risk of rupture.  No A-fib was noted during hospitalization.  Daughter contacted cardiology office in July complaining of increased episode of A-fib and the fatigue, shortness of breath.  By the time he was seen by Laurann Montana, NP on 09/22/2021, he was back in normal sinus rhythm.  Heart monitor was placed in August 2023 which revealed sinus rhythm, episodes of SVT, junctional rhythm with heart rate of 55-60 bpm was also present as well.  No A-fib was noted during the recording.  There was some severe bradycardia between 29-33 bpm during sleep early in the morning.  According to the previous note, patient could not tolerate higher  dose of carvedilol and is currently maintained on 3.125 mg twice a day.  More recently, patient went to the ED on 12/23/2021 with shortness of breath and fatigue.  Basic metabolic panel showed creatinine of 1.34.  BNP 285 which is lower than a month ago.  Troponin negative x2 chest x-ray showed no acute issue.  COVID test negative.  Patient presents today for follow-up along with son.  They also brought a few questions from the daughter as well.  Although his dyspnea on exertion is chronic, he noticed shortness of breath has been getting worse in the past month.  He only noticed shortness of breath when he exerts himself.  Given the mildly elevated BNP recently at 300, I decided to increase his Lasix to 40 mg daily for 2 days before going back down to 20 mg daily thereafter.  I also recommended discontinuation of carvedilol as the patient's son mentions his sister (or the patient's daughter) has been checking his blood pressure and noticed his heart rate occasionally drop down to the 30s during the day.  Although the previous heart monitor also noted heart rate down to the high 20s and the low 30s during sleep, however if he is having frequent bradycardia during the day, that may explain some shortness of breath and fatigue as well.  I will also recommend echocardiogram given worsening dyspnea.  Last echocardiogram was in March 2022.  He will  follow-up with Dr. Martinique in 6 weeks, he has upcoming PFT by pulmonology service.  If echocardiogram and PFT are normal and he continues to have dyspnea on exertion, we may consider PET stress test.  He did denies any chest pain, although anginal equivalent is a possibility as well.   Past Medical History:  Diagnosis Date   Alzheimer disease ([place])    Chronic kidney disease    mild insuffiency   Coronary artery disease    LHC 4/10: Mid LAD 40-50%, then 70%, OM1 20-30%, EF 65%. Mid LAD FFR 0.89 (not hemodynamically significant). Medical therapy was continued.    Dysuria 08/10/2020   Hyperlipidemia    LBBB (left bundle branch block) 06/2018   Obstructive sleep apnea     Past Surgical History:  Procedure Laterality Date   CARDIAC CATHETERIZATION  05/02/2003   single vessel,moderate stenosis mid LAD   CARDIAC CATHETERIZATION  06/12/2008   continue med. therapy   right shoulder surgery     TOTAL HIP ARTHROPLASTY Left 09/14/2018   Procedure: LEFT TOTAL HIP ARTHROPLASTY ANTERIOR APPROACH;  Surgeon: Mcarthur Rossetti, MD;  Location: [facility];  Service: Orthopedics;  Laterality: Left;    Current Medications: Current Meds  Medication Sig   apixaban (ELIQUIS) 5 MG TABS tablet Take 1 tablet (5 mg total) by mouth 2 (two) times daily.   clindamycin (CLEOCIN) 300 MG capsule Take 300 mg by mouth 3 (three) times daily. Uses TID with flares   donepezil (ARICEPT) 10 MG tablet Take 10 mg by mouth at bedtime.   doxycycline (VIBRAMYCIN) 100 MG capsule Take 100 mg by mouth 2 (two) times daily.   fesoterodine (TOVIAZ) 8 MG TB24 tablet Take 8 mg by mouth at bedtime.   fluticasone (FLONASE) 50 MCG/ACT nasal spray Place 2 sprays into both nostrils daily as needed for allergies.   furosemide (LASIX) 20 MG tablet Take 1 tablet (20 mg total) by mouth daily.   ketoconazole (NIZORAL) 2 % cream Apply 1 application topically daily.   lisinopril (ZESTRIL) 20 MG tablet Take 20 mg by mouth at bedtime.   mirabegron ER (MYRBETRIQ) 50 MG TB24 tablet Take 50 mg by mouth every morning.   Misc Natural Products (OSTEO BI-FLEX/5-LOXIN ADVANCED) TABS Take 1 tablet by mouth 2 (two) times daily.   Multiple Vitamin (MULTIVITAMIN WITH MINERALS) TABS tablet Take 1 tablet by mouth every morning.   nitrofurantoin, macrocrystal-monohydrate, (MACROBID) 100 MG capsule Take 1 capsule (100 mg total) by mouth in the morning.   Omega-3 Fatty Acids (FISH OIL) 1200 MG CAPS Take 1,200 mg by mouth 2 (two) times daily.   PRESCRIPTION MEDICATION Inhale into the lungs at bedtime. CPAP   rosuvastatin  (CRESTOR) 10 MG tablet Take 0.5 tablets (5 mg total) by mouth every other day.   [DISCONTINUED] carvedilol (COREG) 3.125 MG tablet Take 1 tablet (3.125 mg total) by mouth 2 (two) times daily.     Allergies:   Enablex [darifenacin hydrobromide er], Ceftriaxone, Atorvastatin, Ezetimibe, Levaquin [levofloxacin], Metoprolol, Oxybutynin, Sulfamethoxazole, Sulfasalazine, and Sulfonamide derivatives   Social History   Socioeconomic History   Marital status: Widowed    Spouse name: Not on file   Number of children: 2   Years of education: Not on file   Highest education level: Not on file  Occupational History   Occupation: [employer]  Tobacco Use   Smoking status: Former    Types: Pipe    Quit date: 02/28/1990    Years since quitting: 31.8   Smokeless tobacco: Never  Tobacco comments:    smoked pipe only   Vaping Use   Vaping Use: Never used  Substance and Sexual Activity   Alcohol use: No    Alcohol/week: 0.0 standard drinks of alcohol    Comment: rarely wine   Drug use: No   Sexual activity: Not on file  Other Topics Concern   Not on file  Social History Narrative   Not on file   Social Determinants of Health   Financial Resource Strain: Not on file  Food Insecurity: Not on file  Transportation Needs: Not on file  Physical Activity: Not on file  Stress: Not on file  Social Connections: Not on file     Family History: The patient's family history includes Cancer in his mother; Hypertension in his father and mother; Other in his father.  ROS:   Please see the history of present illness.     All other systems reviewed and are negative.  EKGs/Labs/Other Studies Reviewed:    The following studies were reviewed today:  Echo 05/22/2020 1. Left ventricular ejection fraction, by estimation, is 60 to 65%. The  left ventricle has normal function. The left ventricle has no regional  wall motion abnormalities. There is mild left ventricular hypertrophy.  Left  ventricular diastolic parameters  are consistent with Grade I diastolic dysfunction (impaired relaxation).   2. Right ventricular systolic function is normal. The right ventricular  size is normal.   3. The mitral valve is normal in structure. Mild mitral valve  regurgitation. No evidence of mitral stenosis.   4. The aortic valve is tricuspid. Aortic valve regurgitation is trivial.  Mild aortic valve sclerosis is present, with no evidence of aortic valve  stenosis.   5. The inferior vena cava is normal in size with greater than 50%  respiratory variability, suggesting right atrial pressure of 3 mmHg.   EKG:  EKG is not ordered today.    Recent Labs: 04/04/2021: ALT 28 09/22/2021: Magnesium 2.2; TSH 1.840 12/23/2021: B Natriuretic Peptide 285.7; BUN 32; Creatinine, Ser 1.34; Hemoglobin 13.1; Platelets 287; Potassium 4.2; Sodium 139  Recent Lipid Panel No results found for: "CHOL", "TRIG", "HDL", "CHOLHDL", "VLDL", "LDLCALC", "LDLDIRECT"   Risk Assessment/Calculations:    CHA2DS2-VASc Score = 5   This indicates a 7.2% annual risk of stroke. The patient's score is based upon: CHF History: 1 HTN History: 1 Diabetes History: 0 Stroke History: 0 Vascular Disease History: 1 Age Score: 2 Gender Score: 0          Physical Exam:    VS:  BP (!) 142/64 (BP Location: Left Arm, Patient Position: Sitting, Cuff Size: Normal)   Pulse 70   Ht '5\' 10"'$  (1.778 m)   Wt 204 lb (92.5 kg)   BMI 29.27 kg/m        Wt Readings from Last 3 Encounters:  12/27/21 204 lb (92.5 kg)  12/23/21 205 lb 7.5 oz (93.2 kg)  10/13/21 205 lb 6.4 oz (93.2 kg)     GEN:  Well nourished, well developed in no acute distress HEENT: Normal NECK: No JVD; No carotid bruits LYMPHATICS: No lymphadenopathy CARDIAC: RRR, no murmurs, rubs, gallops RESPIRATORY:  Clear to auscultation without rales, wheezing or rhonchi  ABDOMEN: Soft, non-tender, non-distended MUSCULOSKELETAL:  No edema; No deformity  SKIN: Warm  and dry NEUROLOGIC:  Alert and oriented x 3 PSYCHIATRIC:  Normal affect   ASSESSMENT:    1. DOE (dyspnea on exertion)   2. Coronary artery disease involving native coronary artery of  native heart without angina pectoris   3. Essential hypertension   4. Hyperlipidemia LDL goal <70   5. LBBB (left bundle branch block)   6. PAF (paroxysmal atrial fibrillation) (HCC)    PLAN:    In order of problems listed above:  Dyspnea on exertion: Patient has been noticing worsening dyspnea on exertion lately.  He denies any chest pain.  Recent ED visit showed mildly elevated BNP, I recommended increase Lasix to 40 mg daily for 2 days before going back down to 20 mg daily thereafter.  He has upcoming PFT.  I also recommended echocardiogram.  If both PFT and echocardiogram are normal, may need to think about PET stress test to rule out anginal equivalent.  CAD: History of nonobstructive disease in 2010.  Hypertension: Blood pressure mildly elevated today, however normally it is very well controlled.  Hyperlipidemia: On Crestor  LBBB: Chronic.  PAF: On Eliquis.  He has been having bradycardia recently, daughter has been noticing his heart rate dropping down to the 30s, previous heart monitor shows bradycardia as well however most of the bradycardia occurs during sleep.  I decided to stop his carvedilol to see how his symptoms change.           Medication Adjustments/Labs and Tests Ordered: Current medicines are reviewed at length with the patient today.  Concerns regarding medicines are outlined above.  Orders Placed This Encounter  Procedures   ECHOCARDIOGRAM COMPLETE   No orders of the defined types were placed in this encounter.   Patient Instructions  Medication Instructions:  INCREASE Lasix to 40 mg daily for 2 days then on day 3 going forward resume Lasix 20 mg daily  STOP Carvedilol (Coreg) *If you need a refill on your cardiac medications before your next appointment, please call  your pharmacy*  Lab Work: NONE ordered at this time of appointment   If you have labs (blood work) drawn today and your tests are completely normal, you will receive your results only by: [place] (if you have MyChart) OR A paper copy in the mail If you have any lab test that is abnormal or we need to change your treatment, we will call you to review the results.  Testing/Procedures: [name], PA-C has requested that you have an echocardiogram. Echocardiography is a painless test that uses sound waves to create images of your heart. It provides your doctor with information about the size and shape of your heart and how well your heart's chambers and valves are working. This procedure takes approximately one hour. There are no restrictions for this procedure. Please do NOT wear cologne, perfume, aftershave, or lotions (deodorant is allowed). Please arrive 15 minutes prior to your appointment time..   Follow-Up: At [facility], you and your health needs are our priority.  As part of our continuing mission to provide you with exceptional heart care, we have created designated Provider Care Teams.  These Care Teams include your primary Cardiologist (physician) and Advanced Practice Providers (APPs -  Physician Assistants and Nurse Practitioners) who all work together to provide you with the care you need, when you need it.   Your next appointment:   As previously scheduled   The format for your next appointment:   In Person  Provider:   Peter Martinique, MD     Other Instructions  Important Information About Sugar         Hilbert Corrigan, [place]  12/29/2021 9:14 PM    [place]

## 2021-12-27 NOTE — Patient Instructions (Addendum)
Medication Instructions:  INCREASE Lasix to 40 mg daily for 2 days then on day 3 going forward resume Lasix 20 mg daily  STOP Carvedilol (Coreg) *If you need a refill on your cardiac medications before your next appointment, please call your pharmacy*  Lab Work: NONE ordered at this time of appointment   If you have labs (blood work) drawn today and your tests are completely normal, you will receive your results only by: [place] (if you have MyChart) OR A paper copy in the mail If you have any lab test that is abnormal or we need to change your treatment, we will call you to review the results.  Testing/Procedures: [name], PA-C has requested that you have an echocardiogram. Echocardiography is a painless test that uses sound waves to create images of your heart. It provides your doctor with information about the size and shape of your heart and how well your heart's chambers and valves are working. This procedure takes approximately one hour. There are no restrictions for this procedure. Please do NOT wear cologne, perfume, aftershave, or lotions (deodorant is allowed). Please arrive 15 minutes prior to your appointment time..   Follow-Up: At [facility], you and your health needs are our priority.  As part of our continuing mission to provide you with exceptional heart care, we have created designated Provider Care Teams.  These Care Teams include your primary Cardiologist (physician) and Advanced Practice Providers (APPs -  Physician Assistants and Nurse Practitioners) who all work together to provide you with the care you need, when you need it.   Your next appointment:   As previously scheduled   The format for your next appointment:   In Person  Provider:   Peter Martinique, MD     Other Instructions  Important Information About Sugar

## 2021-12-29 ENCOUNTER — Encounter: Payer: Self-pay | Admitting: Physician Assistant

## 2021-12-29 NOTE — Progress Notes (Signed)
HPI male former smoker followed for OSA, complicated by CAD, CHF, venous stasis dermatitis, chronic renal disease PFT: 12/16/2011-within normal limits. Small airway flows were quite normal but improved with bronchodilator. FEV1 3.03/107%, FEV1/FVC 0.82, DLCO 0.89. NPSG 12/25/91- AHI 19.3/ hr, desat to 66%, body weight 206 lbs PFT11/223- normal spirometry w/o resp to BD, Diffusion mildly reduced. ------------------------------------------------------------------------------------------------ 10/01/21- 81 year old male former smoker followed for OSA, complicated by CAD/ CHF, chronic renal disease, Dementia/ Alzheimers, GERD with esophagitis, Venous stasis dermatititis, Covid infection [id number],  PAFib,  CPAP auto 5-15/Respicare ([place])Replaced 07/16/21 Download- Body weight today- Covid vax-3 Moderna  -----He is doing well with CPAP and has noticed increased shortness of breath and per daughter it may be due to CHF progressing.  He does not think he can sleep without CPAP.  Recently got replacement for old Respironics machine and we need to add download capability. Cardiology following for atrial fibrillation and CHF with some dyspnea on exertion but no cough or wheeze. He is a former smoker and we discussed getting a PFT for documentation.  This will also be useful for cardiologists. CXR 04/27/21- IMPRESSION: No active cardiopulmonary disease.  12/30/21- 81 year old male former smoker followed for OSA, complicated by CAD/ CHF, chronic renal disease, Dementia/ Alzheimers, GERD with esophagitis, Venous stasis dermatititis, Covid infection [id number],  PAFib,  CPAP auto 5-15/Respicare ([place])Replaced 07/16/21 Download compliance- Body weight today- Covid vax-3 Moderna  Flu vax-today senior ED 12/23/21 for dyspnea. BNP 285, Glu 147. Cr 1.34 for f/u w/ cardiology, nephrology and PCP. Pending further card f/u. PFT11/223- normal spirometry w/o resp to BD, Diffusion mildly reduced. He is here today with  daughter who is after him.  Unfortunately he had fallen 2 days ago hurting left hand, skin and left knee.  His hand is swollen so I suggested they take him onto urgent care after this visit in case he needs x-ray.  Download not available today but daughter confirms he is using his machine routinely.  They need a printed prescription for replacement of mask and supplies. No wheezing or cough but he describes breathlessness with prolonged standing or bending.  Cardiology follow-up is pending. CXR 12/23/21 FINDINGS: Cardiac shadow is within normal limits. Aortic calcifications are noted. The lungs are well aerated bilaterally. No focal infiltrate or sizable effusion is seen. Mild degenerative changes of the thoracic spine are noted. IMPRESSION: No acute abnormality noted.  ROS-see HPI  + = positive Constitutional:   No-   weight loss, night sweats, fevers, chills, fatigue, lassitude. HEENT:   No-  headaches, difficulty swallowing, tooth/dental problems, sore throat,       No-  sneezing, itching, ear ache, nasal congestion, post nasal drip,  CV:  No-   chest pain, orthopnea, PND, swelling in lower extremities, anasarca,                                           dizziness, palpitations Resp: + shortness of breath with exertion or at rest.              No-   productive cough,  No non-productive cough,  No- coughing up of blood.              No-   change in color of mucus.  No- wheezing.   Skin: No-   rash or lesions. GI:   GU:  MS:  No-   joint pain or swelling.  Neuro-     Memory decline- Alzheimers Psych:  No- change in mood or affect. No depression or anxiety.  + memory loss.  OBJ General- Alert, Oriented, Affect-appropriate, Distress- none acute. +Obese Skin- rash-none, lesions- none, excoriation- none Lymphadenopathy- none Head- atraumatic            Eyes- Gross vision intact, PERRLA, +Strabismus, conjunctivae clear secretions            Ears- +Hearing aids, + scarring both TMs             Nose- Clear, no-Septal dev, mucus, polyps, erosion, perforation             Throat- Mallampati IV , mucosa clear , drainage- none, tonsils- atrophic Neck- flexible , trachea midline, no stridor , thyroid nl, carotid no bruit Chest - symmetrical excursion , unlabored           Heart/CV- RRR ,  Murmur+trace S , no gallop  , no rub, nl s1 s2                           - JVD- none , edema- none, stasis changes- none, varices- none           Lung- clear to P&A, wheeze- none, cough- none , dullness-none, rub- none           Chest wall-  Abd-  Br/ Gen/ Rectal- Not done, not indicated Extrem- + elastic hose, cane, + swollen L hand- he can flex fist Neuro- grossly intact to observation

## 2021-12-30 ENCOUNTER — Other Ambulatory Visit: Payer: Self-pay

## 2021-12-30 ENCOUNTER — Emergency Department (HOSPITAL_BASED_OUTPATIENT_CLINIC_OR_DEPARTMENT_OTHER)
Admission: EM | Admit: 2021-12-30 | Discharge: 2021-12-30 | Disposition: A | Discharge status: Home / Self Care | Payer: Medicare Other | Attending: Emergency Medicine | Admitting: Emergency Medicine

## 2021-12-30 ENCOUNTER — Encounter (HOSPITAL_BASED_OUTPATIENT_CLINIC_OR_DEPARTMENT_OTHER): Payer: Self-pay

## 2021-12-30 ENCOUNTER — Encounter: Payer: Self-pay | Admitting: Internal Medicine

## 2021-12-30 ENCOUNTER — Emergency Department (HOSPITAL_BASED_OUTPATIENT_CLINIC_OR_DEPARTMENT_OTHER): Payer: Medicare Other | Admitting: Radiology

## 2021-12-30 ENCOUNTER — Ambulatory Visit (INDEPENDENT_AMBULATORY_CARE_PROVIDER_SITE_OTHER): Payer: Medicare Other | Admitting: Internal Medicine

## 2021-12-30 VITALS — BP 132/72 | HR 97 | Ht 69.5 in | Wt 199.6 lb

## 2021-12-30 DIAGNOSIS — F0280 Dementia in other diseases classified elsewhere without behavioral disturbance: Secondary | ICD-10-CM

## 2021-12-30 DIAGNOSIS — Z23 Encounter for immunization: Secondary | ICD-10-CM

## 2021-12-30 DIAGNOSIS — R0609 Other forms of dyspnea: Secondary | ICD-10-CM

## 2021-12-30 DIAGNOSIS — I2510 Atherosclerotic heart disease of native coronary artery without angina pectoris: Secondary | ICD-10-CM | POA: Insufficient documentation

## 2021-12-30 DIAGNOSIS — G309 Alzheimer's disease, unspecified: Secondary | ICD-10-CM | POA: Diagnosis not present

## 2021-12-30 DIAGNOSIS — N189 Chronic kidney disease, unspecified: Secondary | ICD-10-CM | POA: Diagnosis not present

## 2021-12-30 DIAGNOSIS — G308 Other Alzheimer's disease: Secondary | ICD-10-CM | POA: Diagnosis not present

## 2021-12-30 DIAGNOSIS — S61532A Puncture wound without foreign body of left wrist, initial encounter: Secondary | ICD-10-CM | POA: Insufficient documentation

## 2021-12-30 DIAGNOSIS — S80212A Abrasion, left knee, initial encounter: Secondary | ICD-10-CM | POA: Insufficient documentation

## 2021-12-30 DIAGNOSIS — W540XXA Bitten by dog, initial encounter: Secondary | ICD-10-CM | POA: Insufficient documentation

## 2021-12-30 DIAGNOSIS — M79642 Pain in left hand: Secondary | ICD-10-CM | POA: Diagnosis present

## 2021-12-30 DIAGNOSIS — G4733 Obstructive sleep apnea (adult) (pediatric): Secondary | ICD-10-CM | POA: Diagnosis not present

## 2021-12-30 DIAGNOSIS — Z7901 Long term (current) use of anticoagulants: Secondary | ICD-10-CM | POA: Insufficient documentation

## 2021-12-30 DIAGNOSIS — I25119 Atherosclerotic heart disease of native coronary artery with unspecified angina pectoris: Secondary | ICD-10-CM

## 2021-12-30 DIAGNOSIS — W19XXXA Unspecified fall, initial encounter: Secondary | ICD-10-CM

## 2021-12-30 LAB — PULMONARY FUNCTION TEST
DL/VA % pred: 112 %
DL/VA: 4.34 ml/min/mmHg/L
DLCO cor % pred: 74 %
DLCO cor: 17.97 ml/min/mmHg
DLCO unc % pred: 71 %
DLCO unc: 17.16 ml/min/mmHg
FEF 25-75 Post: 2.33 L/sec
FEF 25-75 Pre: 2.78 L/sec
FEF2575-%Change-Post: -16 %
FEF2575-%Pred-Post: 127 %
FEF2575-%Pred-Pre: 152 %
FEV1-%Change-Post: -1 %
FEV1-%Pred-Post: 87 %
FEV1-%Pred-Pre: 89 %
FEV1-Post: 2.4 L
FEV1-Pre: 2.45 L
FEV1FVC-%Change-Post: 1 %
FEV1FVC-%Pred-Pre: 114 %
FEV6-%Change-Post: 0 %
FEV6-%Pred-Post: 80 %
FEV6-%Pred-Pre: 80 %
FEV6-Post: 2.91 L
FEV6-Pre: 2.93 L
FEV6FVC-%Pred-Post: 107 %
FEV6FVC-%Pred-Pre: 107 %
FVC-%Change-Post: -3 %
FVC-%Pred-Post: 74 %
FVC-%Pred-Pre: 77 %
FVC-Post: 2.91 L
FVC-Pre: 3.01 L
Post FEV1/FVC ratio: 83 %
Post FEV6/FVC ratio: 100 %
Pre FEV1/FVC ratio: 81 %
Pre FEV6/FVC Ratio: 100 %

## 2021-12-30 MED ORDER — PIPERACILLIN-TAZOBACTAM 3.375 G IVPB 30 MIN: 3.3750 g | Freq: Once | INTRAVENOUS | Status: DC

## 2021-12-30 NOTE — ED Notes (Signed)
Reviewed AVS/discharge instruction with patient. Time allotted for and all questions answered. Patient is agreeable for d/c and escorted to ed exit by staff.  

## 2021-12-30 NOTE — ED Provider Notes (Signed)
[facility] EMERGENCY DEPT Provider Note   CSN: [id number] Arrival date & time: 12/30/21  1636     History  Chief Complaint  Patient presents with   [name] [name] is a 81 y.o. male with medical history of Alzheimer disease, CKD, coronary artery disease, paroxysmal A-fib on Eliquis.  Patient presents to ED for evaluation of fall.  Patient reports that 48 hours ago he had ground-level fall where he fell onto the left side of his body.  Patient reports that he attempted to grab a dog prior to fall, the dog jerked him and also bit his left hand.  Patient states that he is now having left-sided wrist, hand and knee pain.  The patient denies hitting his head or losing consciousness.  The patient is currently anticoagulated however on examination there is no obvious trauma to the patient's head.  Patient denies hitting his head.  Patient accompanied by daughter who provides some of the history.  The daughter states that the dog is vaccinated.  The patient is chronically on Macrobid for recurrent UTIs as well as doxycycline for recurrent parotitis.  Patient denies any nausea, vomiting, weakness, numbness, headaches.   Fall Pertinent negatives include no headaches.       Home Medications Prior to Admission medications   Medication Sig Start Date End Date Taking? Authorizing Provider  apixaban (ELIQUIS) 5 MG TABS tablet Take 1 tablet (5 mg total) by mouth 2 (two) times daily. 08/09/21   Martinique, Peter M, MD  clindamycin (CLEOCIN) 300 MG capsule Take 300 mg by mouth 3 (three) times daily. Uses TID with flares 10/01/21   [provider]  donepezil (ARICEPT) 10 MG tablet Take 10 mg by mouth at bedtime. 12/01/16   [provider]  doxycycline (VIBRAMYCIN) 100 MG capsule Take 100 mg by mouth 2 (two) times daily. 08/14/21   [provider]  fesoterodine (TOVIAZ) 8 MG TB24 tablet Take 8 mg by mouth at bedtime. 12/13/18   [provider]   fluticasone (FLONASE) 50 MCG/ACT nasal spray Place 2 sprays into both nostrils daily as needed for allergies. 05/15/17   [provider]  furosemide (LASIX) 20 MG tablet Take 1 tablet (20 mg total) by mouth daily. 09/07/20   Sande Rives E, PA-C  ketoconazole (NIZORAL) 2 % cream Apply 1 application topically daily. 02/27/21   [provider]  lisinopril (ZESTRIL) 20 MG tablet Take 20 mg by mouth at bedtime. 08/23/18   [provider]  mirabegron ER (MYRBETRIQ) 50 MG TB24 tablet Take 50 mg by mouth every morning.    [provider]  Misc Natural Products (OSTEO BI-FLEX/5-LOXIN ADVANCED) TABS Take 1 tablet by mouth 2 (two) times daily.    [provider]  Multiple Vitamin (MULTIVITAMIN WITH MINERALS) TABS tablet Take 1 tablet by mouth every morning.    [provider]  nitrofurantoin, macrocrystal-monohydrate, (MACROBID) 100 MG capsule Take 1 capsule (100 mg total) by mouth in the morning. 01/23/21   Mercy Riding, MD  Omega-3 Fatty Acids (FISH OIL) 1200 MG CAPS Take 1,200 mg by mouth 2 (two) times daily.    [provider]  PRESCRIPTION MEDICATION Inhale into the lungs at bedtime. CPAP    [provider]  rosuvastatin (CRESTOR) 10 MG tablet Take 0.5 tablets (5 mg total) by mouth every other day. 06/28/21   Martinique, Peter M, MD      Allergies    Enablex [darifenacin hydrobromide er], Ceftriaxone, Atorvastatin, Ezetimibe, Levaquin [  levofloxacin], Metoprolol, Oxybutynin, Sulfamethoxazole, Sulfasalazine, and Sulfonamide derivatives    Review of Systems   Review of Systems  Gastrointestinal:  Negative for nausea and vomiting.  Musculoskeletal:  Positive for arthralgias and myalgias.  Neurological:  Negative for syncope, weakness, numbness and headaches.  All other systems reviewed and are negative.   Physical Exam Updated Vital Signs BP 139/78 (BP Location: Right Arm)   Pulse 88   Temp (!) 97.3 F (36.3 C) (Temporal)    Resp 18   Ht 5' 9.5" (1.765 m)   Wt 90.5 kg   SpO2 98%   BMI 29.04 kg/m  Physical Exam Vitals and nursing note reviewed.  Constitutional:      General: He is not in acute distress.    Appearance: He is well-developed.  HENT:     Head: Normocephalic and atraumatic.     Mouth/Throat:     Mouth: Mucous membranes are moist.     Pharynx: Oropharynx is clear.  Eyes:     Conjunctiva/sclera: Conjunctivae normal.  Cardiovascular:     Rate and Rhythm: Normal rate and regular rhythm.     Heart sounds: No murmur heard. Pulmonary:     Effort: Pulmonary effort is normal. No respiratory distress.     Breath sounds: Normal breath sounds.  Abdominal:     Palpations: Abdomen is soft.     Tenderness: There is no abdominal tenderness.  Musculoskeletal:        General: No swelling.     Cervical back: Normal range of motion and neck supple. No tenderness.     Comments: Patient with soft tissue swelling to left wrist as well as left knee.  Patient has intact range of motion to left wrist and knee.  Skin:    General: Skin is warm and dry.     Capillary Refill: Capillary refill takes less than 2 seconds.     Comments: Superficial abrasions noted to patient left knee.  Patient also has 2 puncture wounds to his left wrist however they are nonerythematous.  They appear clean and dry.  Neurological:     General: No focal deficit present.     Mental Status: He is alert and oriented to person, place, and time.     GCS: GCS eye subscore is 4. GCS verbal subscore is 5. GCS motor subscore is 6.     Cranial Nerves: Cranial nerves 2-12 are intact. No cranial nerve deficit.     Sensory: Sensation is intact. No sensory deficit.     Motor: Motor function is intact. No weakness.     Coordination: Coordination is intact. Heel to [facility] Test normal.  Psychiatric:        Mood and Affect: Mood normal.     ED Results / Procedures / Treatments   Labs (all labs ordered are listed, but only abnormal results are  displayed) Labs Reviewed - No data to display  EKG None  Radiology DG Wrist Complete Left  Result Date: 12/30/2021 CLINICAL DATA:  Trauma, fall EXAM: LEFT WRIST - COMPLETE 3+ VIEW COMPARISON:  None Available. FINDINGS: No recent fracture or dislocation is seen. Smooth marginated calcification at the tip of ulnar styloid has not changed suggesting old ununited fracture. Degenerative changes are noted in intercarpal joints with chondrocalcinosis. Severe degenerative changes are noted in first carpometacarpal joint. IMPRESSION: No recent fracture or dislocation is seen in left wrist. Degenerative changes are noted in the intercarpal joints and first carpometacarpal joint. Electronically Signed   By: Prudy Feeler.D.  On: 12/30/2021 18:00   DG Hand Complete Left  Result Date: 12/30/2021 CLINICAL DATA:  Trauma, fall EXAM: LEFT HAND - COMPLETE 3+ VIEW COMPARISON:  12/25/2011 FINDINGS: No recent fracture or dislocation is seen. Degenerative changes are noted in multiple interphalangeal joints. Severe degenerative changes are noted in first carpometacarpal joint with interval progression. Smooth marginated calcification at the tip of ulnar styloid has not changed. IMPRESSION: No fracture or dislocation is seen. Degenerative changes are noted in multiple joints, more severe in the first carpometacarpal joint. Electronically Signed   By: Elmer Picker M.D.   On: 12/30/2021 17:59    Procedures Procedures   Medications Ordered in ED Medications - No data to display   ED Course/ Medical Decision Making/ A&P                           Medical Decision Making Amount and/or Complexity of Data Reviewed Radiology: ordered.  Risk Prescription drug management.   81 year old male presents to ED for evaluation of fall.  Please see HPI for further details.   On examination patient neurological examination shows no focal neurodeficits.  The patient does have soft tissue swelling to his left  wrist, left knee however the patient has intact range of motion.  Patient does have 2 puncture wounds to his left wrist however these are clean and dry.  There is no appearance of infection.  Patient plain film imaging shows no acute fractures of hand, wrist or knee on left.  The patient has no obvious head trauma and his neurological examination shows no focal neurodeficits.  The patient denies adamantly that he struck his head.  At this time, the patient will be discharged home and advised to follow-up with his PCP.  The patient and patient daughter were advised to utilize RICE at home for swelling.  The patient and the patient daughter voiced understanding.  The patient had all their questions answered to their satisfaction.  The patient stable for discharge.  Final Clinical Impression(s) / ED Diagnoses Final diagnoses:  Fall, initial encounter    Rx / DC Orders ED Discharge Orders     None         Lawana Chambers 12/30/21 2002    Wyvonnia Dusky, MD 12/30/21 2043

## 2021-12-30 NOTE — Patient Instructions (Signed)
Spirometry pre/post and dlco performed today. Patient unable to keep pant frequency to perform lung volumes

## 2021-12-30 NOTE — Progress Notes (Signed)
Spirometry pre/post and dlco performed today. Patient unable to keep pant frequency to perform lung volumes.

## 2021-12-30 NOTE — Patient Instructions (Addendum)
Order- printed script for CPAP mask of choice, filter, hoses, supplies   dx OSA This could be used at [facility] or on-line at [employer].com or [place] or etc.  Recommend- go to Urgent Care or ED to get the injured hand and knee looked at.   Order - flu vax senior

## 2021-12-30 NOTE — Discharge Instructions (Signed)
Return to ED with any new or worsening signs or symptoms Please follow-up with patient PCP for further management Please utilize RICE.  Rest, ice, compress, elevate.

## 2021-12-30 NOTE — ED Triage Notes (Signed)
Patient here POV from Home.  Endorses Fall 48 Hours ago when he was holding a Dog and the Dog Jumped and he fell. Pain and Swelling to Left Hand/Wrist/Knee.  No Known Head Injury. No LOC. Does Take Eliquis.   NAD noted during Triage. A&Ox4. GCS 15. BIB Wheelchair.

## 2021-12-31 ENCOUNTER — Encounter: Payer: Self-pay | Admitting: Internal Medicine

## 2021-12-31 NOTE — Assessment & Plan Note (Signed)
His daughter is gradually providing more support as needed.

## 2021-12-31 NOTE — Assessment & Plan Note (Signed)
He continues to use and benefit from CPAP.  Been using a company in [place] but asked for printed prescription today and we discussed ordering supplies online. Plan-printed prescription for supplies, continue auto 5-15

## 2022-01-12 ENCOUNTER — Telehealth: Payer: Self-pay | Admitting: Internal Medicine

## 2022-01-12 NOTE — Telephone Encounter (Signed)
I received a phone call form [name]'s daughter regarding symptoms her father has been experiencing.  He reports heart rates that vary widely from as low as 45 to as high as 100 with normotensive to hypertensive blood pressures.  She reports some fatigue at that time that his heart rate is low.  She also reports an episode of hiccups that has now self resolved after dinner.  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  With regard to the hiccups I reassured her that without associated chest pain or shortness of breath this is likely a benign symptom.  She was reassured and planted bring him to his echocardiogram appointment this Friday.  Rip Harbour, MD Cardiovascular Disease

## 2022-01-14 ENCOUNTER — Ambulatory Visit (HOSPITAL_COMMUNITY): Payer: Medicare Other | Attending: Physician Assistant

## 2022-01-14 DIAGNOSIS — R0609 Other forms of dyspnea: Secondary | ICD-10-CM | POA: Diagnosis present

## 2022-01-14 LAB — ECHOCARDIOGRAM COMPLETE
Area-P 1/2: 3.5 cm2
S' Lateral: 2.4 cm

## 2022-01-14 MED ORDER — PERFLUTREN LIPID MICROSPHERE
1.0000 mL | INTRAVENOUS | Status: AC | PRN
  Administered 2022-01-14: 2 mL via INTRAVENOUS

## 2022-02-03 NOTE — Progress Notes (Signed)
Office Visit    Patient Name: [name] [name] Date of Encounter: 02/03/2022  PCP:  Chesley Noon, MD   [place]  Cardiologist:  Nalia Honeycutt [place], MD  Advanced Practice Provider:  No care team member to display Electrophysiologist:  None      Chief Complaint    [name] [name] is a 81 y.o. male presents today for follow up of atrial fibrillation   Past Medical History    Past Medical History:  Diagnosis Date   Alzheimer disease ([place])    Chronic kidney disease    mild insuffiency   Coronary artery disease    LHC 4/10: Mid LAD 40-50%, then 70%, OM1 20-30%, EF 65%. Mid LAD FFR 0.89 (not hemodynamically significant). Medical therapy was continued.   Dysuria 08/10/2020   Hyperlipidemia    LBBB (left bundle branch block) 06/2018   Obstructive sleep apnea    Past Surgical History:  Procedure Laterality Date   CARDIAC CATHETERIZATION  05/02/2003   single vessel,moderate stenosis mid LAD   CARDIAC CATHETERIZATION  06/12/2008   continue med. therapy   right shoulder surgery     TOTAL HIP ARTHROPLASTY Left 09/14/2018   Procedure: LEFT TOTAL HIP ARTHROPLASTY ANTERIOR APPROACH;  Surgeon: Mcarthur Rossetti, MD;  Location: [facility] ORS;  Service: Orthopedics;  Laterality: Left;    Allergies  Allergies  Allergen Reactions   Enablex [Darifenacin Hydrobromide Er]     PT STATES IT MAKES HIM "BONKERS" AND VERY TIRED   Ceftriaxone Other (See Comments)    Unknown reaction   Atorvastatin Other (See Comments)    Muscle aches/weakness   Ezetimibe Other (See Comments)    Muscle aches/weakness   Levaquin [Levofloxacin] Diarrhea   Metoprolol Rash   Oxybutynin Other (See Comments)    Memory impairment, temperament changes   Sulfamethoxazole Other (See Comments)    Childhood allergy   Sulfasalazine Other (See Comments)    Childhood allergy   Sulfonamide Derivatives Other (See Comments)    Childhood allergy    History of Present Illness    [name] [name]  is a 81 y.o. male with a hx of Aslzheimer's disease, diastolic heart failure, OSA on CPAP, HTN, HLD, moderate nonobstructive CAD on cardiac cath 2010 (FFR on LAD 2010 negative, Myoview 06/2018 low risk) , chronic shortness of breath, LBBB, KCD III, atrial fibrillation   Admission 06/2020 for parotitis treated with antibiotics followed by ENT. Seen in clinic 08/2020 due to dyspnea. Recommended to continue Lasix '20mg'$  daily for mild diastolic dysfunction. Admitted 09/11/20 and treated for UTI.   Admitted 12/2020 with sepsis due to deep neck infection. Agitation and infection resolved with Decadron and abx. EKG with atrial fibrillation. Rate controlled at rest but 140s with activity. Low dose Metoprolol and OAC were discussed but not initiated. Eliquis subsequently started in outpatient setting.   Admitted 03/2021 with AMS and SIRS due to Spiro. 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 At follow up 04/20/21 still short of breath with bending or turning which was chronic. No atrial fibrillation during this hospitalization.   Daughter contacted the office 09/15/21 noting increased episodes of atrial fib with fatigue, shortness of breath. Was seen by Laurann Montana NP on 7/26 and was in NSR. Labs were done and were OK. A 2 week event monitor was placed. This showed no Afib. Some nonsustained runs of SVT. Junctional rhythm with rate 55-60 seemed to correlate  with symptoms.   He presented to the ED on 12/23/2021 with shortness of breath and fatigue.  Basic metabolic panel showed creatinine of 1.34.  BNP 285 which is lower than a month ago.  Troponin negative x2 chest x-ray showed no acute issue.  COVID test negative. Was seen in follow up with [name] PA-C- his lasix was increased for a couple of days. His Coreg was discontinued due to findings of bradycardia intermittently. Echo was done and showed normal LV EF with grade 2 diastolic  dysfunction.  PFTS were also arranged. This looked OK with mildly reduced DLCO.    He is seen today with his daughter. Concern that he remains SOB and has marked fatigue. This has been present for almost a year. May have started with a bad bout of Covid last February. He denies any chest pain. No increase in edema or weight. Extra doses of lasix made no difference. Daughter concerned about junctional rhythm noted in ED. Coreg was subsequently stopped. No dizziness. His SOB may occur any time.   EKGs/Labs/Other Studies Reviewed:   The following studies were reviewed today:  Echo 04/2020 1. Left ventricular ejection fraction, by estimation, is 60 to 65%. The  left ventricle has normal function. The left ventricle has no regional  wall motion abnormalities. There is mild left ventricular hypertrophy.  Left ventricular diastolic parameters  are consistent with Grade I diastolic dysfunction (impaired relaxation).   2. Right ventricular systolic function is normal. The right ventricular  size is normal.   3. The mitral valve is normal in structure. Mild mitral valve  regurgitation. No evidence of mitral stenosis.   4. The aortic valve is tricuspid. Aortic valve regurgitation is trivial.  Mild aortic valve sclerosis is present, with no evidence of aortic valve  stenosis.   5. The inferior vena cava is normal in size with greater than 50%  respiratory variability, suggesting right atrial pressure of 3 mmHg.   Event monitor 10/14/21: Study Highlights      Normal sinus rhythm   3 runs of SVT longest lasting 31 seconds.   Junctional rhythm with rate 55-60 bpm. Patient triggered events correlate with this   No Atrial fibrillation     Patch Wear Time:  8 days and 1 hours (2023-07-26T08:56:18-0400 to 2023-08-03T10:09:56-0400)   Patient had a min HR of 29 bpm, max HR of 102 bpm, and avg HR of 63 bpm. Predominant underlying rhythm was Sinus Rhythm. First Degree AV Block was present. Bundle Branch  Block/IVCD was present. QRS morphology changes were present throughout recording. 3  Supraventricular Tachycardia runs occurred, the run with the fastest interval lasting 10 beats with a max rate of 102 bpm, the longest lasting 31.4 secs with an avg rate of 96 bpm. Junctional Rhythm was present. Junctional Rhythm was detected within +/-  45 seconds of symptomatic patient event(s). Isolated SVEs were rare (<1.0%), SVE Couplets were rare (<1.0%), and SVE Triplets were rare (<1.0%). Isolated VEs were rare (<1.0%), and no VE Couplets or VE Triplets were present.  Echo 01/14/22: IMPRESSIONS     1. Left ventricular ejection fraction, by estimation, is 65 to 70%. The  left ventricle has normal function. The left ventricle has no regional  wall motion abnormalities. There is mild left ventricular hypertrophy.  Left ventricular diastolic parameters  are consistent with Grade II diastolic dysfunction (pseudonormalization).  Elevated left atrial pressure.   2. Right ventricular systolic function is normal. The right ventricular  size is normal. Tricuspid regurgitation signal  is inadequate for assessing  PA pressure.   3. The mitral valve is normal in structure. No evidence of mitral valve  regurgitation. No evidence of mitral stenosis.   4. The aortic valve was not well visualized. Aortic valve regurgitation  is not visualized. No aortic stenosis is present.    EKG:  EKG is ordered today.  The ekg ordered today demonstrates NSR 80 bpm with first degree AV block and LBBB. I have personally reviewed and interpreted this study.   Recent Labs: 04/04/2021: ALT 28 09/22/2021: Magnesium 2.2; TSH 1.840 12/23/2021: B Natriuretic Peptide 285.7; BUN 32; Creatinine, Ser 1.34; Hemoglobin 13.1; Platelets 287; Potassium 4.2; Sodium 139  Recent Lipid Panel No results found for: "CHOL", "TRIG", "HDL", "CHOLHDL", "VLDL", "LDLCALC", "LDLDIRECT"  Risk Assessment/Calculations:   CHA2DS2-VASc Score = 5   This  indicates a 7.2% annual risk of stroke. The patient's score is based upon: CHF History: 1 HTN History: 1 Diabetes History: 0 Stroke History: 0 Vascular Disease History: 1 Age Score: 2 Gender Score: 0     Home Medications   No outpatient medications have been marked as taking for the 02/07/22 encounter (Appointment) with Martin[place], Ronetta Molla M, MD.     Review of Systems      All other systems reviewed and are otherwise negative except as noted above.  Physical Exam    VS:  There were no vitals taken for this visit. , BMI There is no height or weight on file to calculate BMI.  Wt Readings from Last 3 Encounters:  12/30/21 199 lb 8.3 oz (90.5 kg)  12/30/21 199 lb 9.6 oz (90.5 kg)  12/27/21 204 lb (92.5 kg)    GEN: Well nourished, overweight, well developed, in no acute distress. HEENT: normal. Neck: Supple, no JVD, carotid bruits, or masses. Cardiac: RRR, no murmurs, rubs, or gallops. No clubbing, cyanosis, edema.  Radials/PT 2+ and equal bilaterally.  Respiratory:  Respirations regular and unlabored, clear to auscultation bilaterally. GI: Soft, nontender, nondistended. MS: No deformity or atrophy. Skin: Warm and dry, no rash. Neuro:  Strength and sensation are intact. Psych: Normal affect.  Assessment & Plan    Atrial fibrillation / Hypercoagulable state  Had Afib during hospitalization in November 2022 when septic. No documented recurrence since then. In fact event monitor in July showed no afib. Remains on Eliquis. Beta blocker discontinued due to some intermittent junctional rhythm. HR normal.   Chronic diastolic heart failure - Weight is stable compared to last clinic visit. No edema, orthopnea, PND. Continue Lasix '20mg'$  QD.  Low sodium diet, fluid restriction <2L, and daily weights encouraged.   Chronic SOB - I really don't think this is primarily a cardiac issue. Does not appear to be significantly volume overloaded. Symptoms did not change with diuresis. CXR and CT before  were OK. PFTs only mildly abnormal. 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 I have made no changes in his medical therapy today. Will monitor for now.   HTN - BP well controlled.    Disposition: Follow up  6 months with Florella Mcneese [place], MD  Signed, Zykiria Bruening [place], MD 02/03/2022, 9:30 AM [place]

## 2022-02-07 ENCOUNTER — Ambulatory Visit: Payer: Medicare Other | Attending: Cardiology | Admitting: Cardiology

## 2022-02-07 ENCOUNTER — Encounter: Payer: Self-pay | Admitting: Cardiology

## 2022-02-07 VITALS — BP 152/84 | HR 80 | Ht 70.0 in | Wt 205.4 lb

## 2022-02-07 DIAGNOSIS — I2510 Atherosclerotic heart disease of native coronary artery without angina pectoris: Secondary | ICD-10-CM | POA: Insufficient documentation

## 2022-02-07 DIAGNOSIS — I447 Left bundle-branch block, unspecified: Secondary | ICD-10-CM | POA: Diagnosis not present

## 2022-02-07 DIAGNOSIS — I5032 Chronic diastolic (congestive) heart failure: Secondary | ICD-10-CM | POA: Insufficient documentation

## 2022-02-07 DIAGNOSIS — R0609 Other forms of dyspnea: Secondary | ICD-10-CM | POA: Diagnosis not present

## 2022-02-07 DIAGNOSIS — I25119 Atherosclerotic heart disease of native coronary artery with unspecified angina pectoris: Secondary | ICD-10-CM

## 2022-02-07 NOTE — Addendum Note (Signed)
Addended by: Kathyrn Lass on: 02/07/2022 02:25 PM   Modules accepted: Orders

## 2022-03-04 ENCOUNTER — Encounter: Payer: Self-pay | Admitting: Podiatry

## 2022-03-04 ENCOUNTER — Ambulatory Visit (INDEPENDENT_AMBULATORY_CARE_PROVIDER_SITE_OTHER): Payer: Medicare Other | Admitting: Podiatry

## 2022-03-04 DIAGNOSIS — M79675 Pain in left toe(s): Secondary | ICD-10-CM

## 2022-03-04 DIAGNOSIS — M79674 Pain in right toe(s): Secondary | ICD-10-CM

## 2022-03-04 DIAGNOSIS — B351 Tinea unguium: Secondary | ICD-10-CM

## 2022-03-04 DIAGNOSIS — Z7901 Long term (current) use of anticoagulants: Secondary | ICD-10-CM

## 2022-03-04 NOTE — Progress Notes (Signed)
  Subjective:  Patient ID: [name] [name], male    DOB: 01-Jan-1941,   MRN: [id number]  Chief Complaint  Patient presents with   Nail Problem     Bil nail fungus     82 y.o. male presents for concern of thickened elongated and painful nails that are difficult to trim. Requesting to have them trimmed today. Also relates some concern for pain in his fifth toe in certain shoes. Does relates some burning sensation in feet that come and go.  He is not diabetic but is currently on Eliquis.   PCP:  Chesley Noon, MD    . Denies any other pedal complaints. Denies n/v/f/c.   Past Medical History:  Diagnosis Date   Alzheimer disease ([place])    Chronic kidney disease    mild insuffiency   Coronary artery disease    LHC 4/10: Mid LAD 40-50%, then 70%, OM1 20-30%, EF 65%. Mid LAD FFR 0.89 (not hemodynamically significant). Medical therapy was continued.   Dysuria 08/10/2020   Hyperlipidemia    LBBB (left bundle branch block) 06/2018   Obstructive sleep apnea     Objective:  Physical Exam: Vascular: DP/PT pulses 2/4 bilateral. CFT <3 seconds. Absent hair growth on digits. Edema noted to bilateral lower extremities. Xerosis noted bilaterally.  Skin. No lacerations or abrasions bilateral feet. Nails 1-5 bilateral  are thickened discolored and elongated with subungual debris.  Mild erythema noted over dorsum of fifth digit appear to be rubbing from shoes.  Musculoskeletal: MMT 5/5 bilateral lower extremities in DF, PF, Inversion and Eversion. Deceased ROM in DF of ankle joint. Hammered digits 2-5 bilateral.  Neurological: Sensation intact to light touch. Protective sensation intact bilateral.    Assessment:   1. Pain due to onychomycosis of toenails of both feet   2. Chronic anticoagulation      Plan:  Patient was evaluated and treated and all questions answered. -Discussed and educated patient on foot care, especially with  regards to the vascular, neurological and musculoskeletal  systems.  -Discussed supportive shoes at all times and checking feet regularly.  -Mechanically debrided all nails 1-5 bilateral using sterile nail nipper and filed with dremel without incident  -Answered all patient questions -Toe cap provided for right fifth digit area of rubbing.  -Patient to return  in 3 months for at risk foot care -Patient advised to call the office if any problems or questions arise in the meantime.   Lorenda Peck, DPM

## 2022-03-09 ENCOUNTER — Ambulatory Visit: Payer: Medicare Other | Admitting: Podiatry

## 2022-03-14 ENCOUNTER — Other Ambulatory Visit: Payer: Self-pay

## 2022-03-14 DIAGNOSIS — I480 Paroxysmal atrial fibrillation: Secondary | ICD-10-CM

## 2022-03-14 MED ORDER — APIXABAN 5 MG PO TABS: 5.0000 mg | ORAL_TABLET | Freq: Two times a day (BID) | ORAL | 5 refills | Status: DC

## 2022-03-14 NOTE — Telephone Encounter (Signed)
Prescription refill request for Eliquis received. Indication: Afib   Last office visit:02/07/22 ([place])  Scr:1.34 (12/23/21)  Age: 82 Weight: 93.2kg   Appropriate dose and refill sent to requested pharmacy.

## 2022-03-20 ENCOUNTER — Other Ambulatory Visit: Payer: Self-pay

## 2022-03-20 ENCOUNTER — Encounter (HOSPITAL_BASED_OUTPATIENT_CLINIC_OR_DEPARTMENT_OTHER): Payer: Self-pay | Admitting: *Deleted

## 2022-03-20 ENCOUNTER — Emergency Department (HOSPITAL_BASED_OUTPATIENT_CLINIC_OR_DEPARTMENT_OTHER)
Admission: EM | Admit: 2022-03-20 | Discharge: 2022-03-20 | Disposition: A | Discharge status: Home / Self Care | Payer: Medicare Other | Attending: Emergency Medicine | Admitting: Emergency Medicine

## 2022-03-20 ENCOUNTER — Emergency Department (HOSPITAL_BASED_OUTPATIENT_CLINIC_OR_DEPARTMENT_OTHER): Payer: Medicare Other

## 2022-03-20 DIAGNOSIS — G309 Alzheimer's disease, unspecified: Secondary | ICD-10-CM | POA: Diagnosis not present

## 2022-03-20 DIAGNOSIS — M7989 Other specified soft tissue disorders: Secondary | ICD-10-CM | POA: Diagnosis present

## 2022-03-20 DIAGNOSIS — Z8616 Personal history of COVID-19: Secondary | ICD-10-CM | POA: Insufficient documentation

## 2022-03-20 DIAGNOSIS — N189 Chronic kidney disease, unspecified: Secondary | ICD-10-CM | POA: Diagnosis not present

## 2022-03-20 DIAGNOSIS — R001 Bradycardia, unspecified: Secondary | ICD-10-CM

## 2022-03-20 DIAGNOSIS — Z7901 Long term (current) use of anticoagulants: Secondary | ICD-10-CM | POA: Diagnosis not present

## 2022-03-20 DIAGNOSIS — I2510 Atherosclerotic heart disease of native coronary artery without angina pectoris: Secondary | ICD-10-CM | POA: Diagnosis not present

## 2022-03-20 DIAGNOSIS — L03116 Cellulitis of left lower limb: Secondary | ICD-10-CM | POA: Insufficient documentation

## 2022-03-20 LAB — CBC WITH DIFFERENTIAL/PLATELET
Abs Immature Granulocytes: 0.01 10*3/uL (ref 0.00–0.07)
Basophils Absolute: 0.1 10*3/uL (ref 0.0–0.1)
Basophils Relative: 1 %
Eosinophils Absolute: 0.1 10*3/uL (ref 0.0–0.5)
Eosinophils Relative: 3 %
HCT: 44.7 % (ref 39.0–52.0)
Hemoglobin: 15 g/dL (ref 13.0–17.0)
Immature Granulocytes: 0 %
Lymphocytes Relative: 22 %
Lymphs Abs: 1.2 10*3/uL (ref 0.7–4.0)
MCH: 31 pg (ref 26.0–34.0)
MCHC: 33.6 g/dL (ref 30.0–36.0)
MCV: 92.4 fL (ref 80.0–100.0)
Monocytes Absolute: 0.5 10*3/uL (ref 0.1–1.0)
Monocytes Relative: 8 %
Neutro Abs: 3.6 10*3/uL (ref 1.7–7.7)
Neutrophils Relative %: 66 %
Platelets: 246 10*3/uL (ref 150–400)
RBC: 4.84 MIL/uL (ref 4.22–5.81)
RDW: 13.1 % (ref 11.5–15.5)
WBC: 5.5 10*3/uL (ref 4.0–10.5)
nRBC: 0 % (ref 0.0–0.2)

## 2022-03-20 LAB — BASIC METABOLIC PANEL
Anion gap: 6 (ref 5–15)
BUN: 27 mg/dL — ABNORMAL HIGH (ref 8–23)
CO2: 29 mmol/L (ref 22–32)
Calcium: 11.1 mg/dL — ABNORMAL HIGH (ref 8.9–10.3)
Chloride: 103 mmol/L (ref 98–111)
Creatinine, Ser: 1.17 mg/dL (ref 0.61–1.24)
GFR, Estimated: >60 mL/min (ref >60)
Glucose, Bld: 126 mg/dL — ABNORMAL HIGH (ref 70–99)
Potassium: 4 mmol/L (ref 3.5–5.1)
Sodium: 138 mmol/L (ref 135–145)

## 2022-03-20 LAB — LACTIC ACID, PLASMA: Lactic Acid, Venous: 1.3 mmol/L (ref 0.5–1.9)

## 2022-03-20 MED ORDER — CLINDAMYCIN HCL 300 MG PO CAPS: 300.0000 mg | ORAL_CAPSULE | Freq: Three times a day (TID) | ORAL | 0 refills | Status: DC

## 2022-03-20 NOTE — ED Notes (Signed)
Noted that pt was brady, HR=30s-40s.  Pt denies any pain, shortness of breath, or dizziness.  EKG obtained and MD notified.

## 2022-03-20 NOTE — ED Notes (Signed)
ED Provider at bedside. 

## 2022-03-20 NOTE — ED Provider Notes (Signed)
[facility] Provider Note   CSN: [id number] Arrival date & time: 03/20/22  1759     History  Chief Complaint  Patient presents with   Cellulitis    [name] [name] is a 82 y.o. male.  HPI Patient with pain and some swelling on his left lower leg.  Has had for around a week.  Has been on doxycycline and then mupirocin.  States he is also on doxycycline for other infections.  Also on Macrobid for chronic UTI prevention.  No fevers.  Has seen PCP and started on mupirocin and told to come in if redness worsen.  No fevers or chills.  Redness had worsened.  Is on Eliquis.  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 Mid LAD FFR 0.89 (not hemodynamically significant). Medical therapy was continued.   Dysuria 08/10/2020   Hyperlipidemia    LBBB (left bundle branch block) 06/2018   Obstructive sleep apnea     Home Medications Prior to Admission medications   Medication Sig Start Date End Date Taking? Authorizing Provider  clindamycin (CLEOCIN) 300 MG capsule Take 1 capsule (300 mg total) by mouth 3 (three) times daily. 03/20/22  Yes Davonna Belling, MD  apixaban (ELIQUIS) 5 MG TABS tablet Take 1 tablet (5 mg total) by mouth 2 (two) times daily. 03/14/22   Martinique, Peter M, MD  donepezil (ARICEPT) 10 MG tablet Take 10 mg by mouth at bedtime. 12/01/16   [provider]  fesoterodine (TOVIAZ) 8 MG TB24 tablet Take 8 mg by mouth at bedtime. 12/13/18   [provider]  fluticasone (FLONASE) 50 MCG/ACT nasal spray Place 2 sprays into both nostrils daily as needed for allergies. 05/15/17   [provider]  furosemide (LASIX) 20 MG tablet Take 1 tablet (20 mg total) by mouth daily. 09/07/20   Sande Rives E, PA-C  ketoconazole (NIZORAL) 2 % cream Apply 1 application topically daily. 02/27/21   [provider]  lisinopril (ZESTRIL) 20 MG tablet Take 20 mg by mouth at bedtime. 08/23/18   [provider]  mirabegron ER (MYRBETRIQ) 50 MG TB24 tablet Take 50 mg by mouth every morning.    [provider]  Misc Natural Products (OSTEO BI-FLEX/5-LOXIN ADVANCED) TABS Take 1 tablet by mouth 2 (two) times daily.    [provider]  Multiple Vitamin (MULTIVITAMIN WITH MINERALS) TABS tablet Take 1 tablet by mouth every morning.    [provider]  nitrofurantoin, macrocrystal-monohydrate, (MACROBID) 100 MG capsule Take 1 capsule (100 mg total) by mouth in the morning. 01/23/21   Mercy Riding, MD  Omega-3 Fatty Acids (FISH OIL) 1200 MG CAPS Take 1,200 mg by mouth 2 (two) times daily.    [provider]  PRESCRIPTION MEDICATION Inhale into the lungs at bedtime. CPAP    [provider]  rosuvastatin (CRESTOR) 10 MG tablet Take 0.5 tablets (5 mg total) by mouth every other day. 06/28/21   Martinique, Peter M, MD      Allergies    Enablex [darifenacin hydrobromide er], Ceftriaxone, Atorvastatin, Ezetimibe, Levaquin [levofloxacin], Metoprolol, Oxybutynin, Sulfamethoxazole, Sulfasalazine, and Sulfonamide derivatives    Review of Systems   Review of Systems  Physical Exam Updated Vital  Signs BP 128/62 (BP Location: Right Arm)   Pulse (!) 43   Temp 97.8 F (36.6 C) (Oral)   Resp 16   Ht '5\' 10"'$  (1.778 m)   Wt 90.7 kg   SpO2 99%   BMI 28.70 kg/m  Physical Exam Vitals and nursing note reviewed.  HENT:     Head: Normocephalic and atraumatic.  Pulmonary:     Effort: No respiratory distress.  Musculoskeletal:     Right lower leg: Edema present.     Left lower leg: Edema present.     Comments: Mild edema bilateral lower extremities.  Has some erythema on the left lower extremity over most of the anterior leg.  Centrally there is some  induration.  No real warmth however.  Skin:    Capillary Refill: Capillary refill takes less than 2 seconds.  Neurological:     Mental Status: He is alert and oriented to person, place, and time.     ED Results / Procedures / Treatments   Labs (all labs ordered are listed, but only abnormal results are displayed) Labs Reviewed  BASIC METABOLIC PANEL - Abnormal; Notable for the following components:      Result Value   Glucose, Bld 126 (*)    BUN 27 (*)    Calcium 11.1 (*)    All other components within normal limits  CBC WITH DIFFERENTIAL/PLATELET  LACTIC ACID, PLASMA    EKG EKG Interpretation  Date/Time:  Sunday March 20 2022 20:15:43 EST Ventricular Rate:  41 PR Interval:  206 QRS Duration: 150 QT Interval:  479 QTC Calculation: 396 R Axis:   23 Text Interpretation: Sinus bradycardia Left bundle branch block rate decreased Confirmed by Davonna Belling [phone]) on 03/20/2022 8:20:50 PM  Radiology US Venous Img Lower  Left (DVT Study)  Result Date: 03/20/2022 CLINICAL DATA:  Left lower extremity pain, redness and swelling x4 days. EXAM: LEFT LOWER EXTREMITY VENOUS DOPPLER ULTRASOUND TECHNIQUE: Gray-scale sonography with compression, as well as color and duplex ultrasound, were performed to evaluate the deep venous system(s) from the level of the common femoral vein through the popliteal and proximal calf veins. COMPARISON:  None Available. FINDINGS: VENOUS Normal compressibility of the common femoral, superficial femoral, and popliteal veins, as well as the visualized calf veins. Visualized portions of profunda femoral vein and great saphenous vein unremarkable. No filling defects to suggest DVT on grayscale or color Doppler imaging. Doppler waveforms show normal direction of venous flow, normal respiratory plasticity and response to augmentation. Limited views of the contralateral common femoral vein are unremarkable. OTHER A 3.8 cm x 1.8 cm x 2.9 cm complex collection is seen  within the medial aspect of the left popliteal fossa. Limitations: none IMPRESSION: 1. No evidence of DVT within the LEFT lower extremity. 2. 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 Electronically Signed   By: Virgina Norfolk M.D.   On: 03/20/2022 20:03    Procedures Procedures    Medications Ordered in ED Medications - No data to display  ED Course/ Medical Decision Making/ A&P                             Medical Decision Making Risk Prescription drug management.   Patient with redness and swelling of left lower extremity.  Has been on doxycycline and mupirocin.  Some worsening symptoms.  White count reassuring.  Differential diagnosis includes infection, DVT.  Will get Doppler.  White count being low  is reassuring. Dopplers negative.  However found to be bradycardic.  Heart rates primarily in the 40s and up to 50s.  Sinus at times but also junctional.  Extensive review of cardiology notes done.  Shows has had bradycardia for months now.  Had had his Coreg stopped because of it.  Had worn a monitor.  However for particularly had episodes in the past that were in the morning.  Now appears more generalized.  Discussed with patient and family are.  Has been more fatigued over the last year but not necessarily recently.  Discussed with cardiology fellow, Dr. Humphrey Rolls.  Appears stable for outpatient follow-up with cardiology. Will give antibiotics for the redness on the leg.  Has had previous infections there.  Has worsened on doxycycline although he is on it for his mouth.  Will stop the doxycycline and start clindamycin.  Does not appear septic.  Will discharge home with short-term outpatient follow-up        Final Clinical Impression(s) / ED Diagnoses Final diagnoses:  Cellulitis of left lower extremity  Bradycardia    Rx / DC Orders ED Discharge Orders          Ordered    Ambulatory referral to Cardiology       Comments: If you have not heard from the  Cardiology office within the next 72 hours please call [phone].   03/20/22 2223    clindamycin (CLEOCIN) 300 MG capsule  3 times daily        03/20/22 2224              Davonna Belling, MD 03/20/22 2229

## 2022-03-20 NOTE — ED Triage Notes (Signed)
BIB family from home for L leg redness, swelling, heat, TTP, and pain (no fever or drainage), seen for the same 4 days ago by PCP, dx'd with cellulitis, started on doxycycline and mupirocin. Not getting better. H/o sepsis. Takes chronic macrobid for UTIs. Mentions chronic long covid respiratory issues. Seen by EDPA for MSE in triage.

## 2022-03-20 NOTE — ED Provider Triage Note (Signed)
Emergency Medicine Provider Triage Evaluation Note  [name] [name] , a 82 y.o. male  was evaluated in triage.  Pt complains of history CKD, Alzheimer's, CAD, hyperlipidemia.  Patient brought in by family member for possible cellulitis of left lower leg he has noticed swelling and pain to the anterior left lower leg x 1 week, he has been on doxycycline without improvement.  He is also on Macrobid for a recurrent UTI.  He reports his leg is his only concern at this time.  Review of Systems  Positive: Leg pain/swelling Negative: Fever, chills, fall, injury or any additional concerns  Physical Exam  BP 139/81 (BP Location: Right Arm)   Pulse 88   Temp 97.8 F (36.6 C) (Oral)   Resp 18   SpO2 100%  Gen:   Awake, no distress   Resp:  Normal effort  MSK:   Moves extremities without difficulty, left lower extremity erythematous slightly swollen without skin break.  Pedal pulse intact.  Capillary fill and sensation intact.   Medical Decision Making  Medically screening exam initiated at 6:30 PM.  Appropriate orders placed.  [name] [name] was informed that the remainder of the evaluation will be completed by another provider, this initial triage assessment does not replace that evaluation, and the importance of remaining in the ED until their evaluation is complete.    Note: Portions of this report may have been transcribed using voice recognition software. Every effort was made to ensure accuracy; however, inadvertent computerized transcription errors may still be present.    [name] [name], [place] 03/20/22 1831

## 2022-03-25 ENCOUNTER — Encounter: Payer: Self-pay | Admitting: Cardiology

## 2022-03-25 ENCOUNTER — Ambulatory Visit: Payer: Medicare Other | Attending: Cardiology | Admitting: Cardiology

## 2022-03-25 VITALS — BP 136/80 | HR 80 | Ht 70.0 in | Wt 203.6 lb

## 2022-03-25 DIAGNOSIS — I2510 Atherosclerotic heart disease of native coronary artery without angina pectoris: Secondary | ICD-10-CM | POA: Insufficient documentation

## 2022-03-25 DIAGNOSIS — I5032 Chronic diastolic (congestive) heart failure: Secondary | ICD-10-CM | POA: Insufficient documentation

## 2022-03-25 DIAGNOSIS — R001 Bradycardia, unspecified: Secondary | ICD-10-CM | POA: Insufficient documentation

## 2022-03-25 DIAGNOSIS — R0609 Other forms of dyspnea: Secondary | ICD-10-CM | POA: Insufficient documentation

## 2022-03-25 DIAGNOSIS — I447 Left bundle-branch block, unspecified: Secondary | ICD-10-CM | POA: Insufficient documentation

## 2022-03-25 NOTE — Patient Instructions (Signed)
Medication Instructions:   Your physician recommends that you continue on your current medications as directed. Please refer to the Current Medication list given to you today.  *If you need a refill on your cardiac medications before your next appointment, please call your pharmacy*  Lab Work: NONE ordered at this time of appointment   If you have labs (blood work) drawn today and your tests are completely normal, you will receive your results only by: [place] (if you have MyChart) OR A paper copy in the mail If you have any lab test that is abnormal or we need to change your treatment, we will call you to review the results.  Testing/Procedures: NONE ordered at this time of appointment   Follow-Up: At [facility], you and your health needs are our priority.  As part of our continuing mission to provide you with exceptional heart care, we have created designated Provider Care Teams.  These Care Teams include your primary Cardiologist (physician) and Advanced Practice Providers (APPs -  Physician Assistants and Nurse Practitioners) who all work together to provide you with the care you need, when you need it.    Your next appointment:   3 month(s)  Provider:   Peter Martinique, MD     Other Instructions

## 2022-03-25 NOTE — Progress Notes (Signed)
Office Visit    Patient Name: [name] [name] Date of Encounter: 03/25/2022  PCP:  Chesley Noon, MD   [place]  Cardiologist:  Brevan Luberto Martinique, MD  Advanced Practice Provider:  No care team member to display Electrophysiologist:  None      Chief Complaint    [name] [name] is a 82 y.o. male presents today for evaluation of bradycardia.  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 Mid LAD FFR 0.89 (not hemodynamically significant). Medical therapy was continued.   Dysuria 08/10/2020   Hyperlipidemia    LBBB (left bundle branch block) 06/2018   Obstructive sleep apnea    Past Surgical History:  Procedure Laterality Date   CARDIAC CATHETERIZATION  05/02/2003   single vessel,moderate stenosis mid LAD   CARDIAC CATHETERIZATION  06/12/2008   continue med. therapy   right shoulder surgery     TOTAL HIP ARTHROPLASTY Left 09/14/2018   Procedure: LEFT TOTAL HIP ARTHROPLASTY ANTERIOR APPROACH;  Surgeon: Mcarthur Rossetti, MD;  Location: [facility];  Service: Orthopedics;  Laterality: Left;    Allergies  Allergies  Allergen Reactions   Enablex [Darifenacin Hydrobromide Er]     PT STATES IT MAKES HIM "BONKERS" AND VERY TIRED   Atorvastatin Other (See Comments)    Muscle aches/weakness  Other reaction(s): Other (See Comments)  Muscle aches/weakness  Muscle aches/weakness  Muscle aches/weakness  Muscle aches/weakness  Muscle aches/weakness  Muscle aches/weakness   Ceftriaxone Other (See Comments)    Unknown reaction  Other reaction(s): Other (See Comments)  Unknown reaction  Unknown reaction    Unknown reaction  Unknown reaction  Unknown reaction   Ezetimibe Other (See Comments)    Muscle aches/weakness  Other reaction(s): Other (See Comments)  Muscle  aches/weakness  Muscle aches/weakness  Muscle aches/weakness   Levaquin [Levofloxacin] Diarrhea   Metoprolol Rash   Oxybutynin Other (See Comments)    Memory impairment, temperament changes   Sulfamethoxazole Other (See Comments)    Childhood allergy   Sulfasalazine Other (See Comments)    Childhood allergy   Sulfonamide Derivatives Other (See Comments)    Childhood allergy    History of Present Illness    [name] [name] is a 82 y.o. male with a hx of Aslzheimer's disease, diastolic heart failure, OSA on CPAP, HTN, HLD, moderate nonobstructive CAD on cardiac cath 2010 (FFR on LAD 2010 negative, Myoview 06/2018 low risk) , chronic shortness of breath, LBBB, KCD III, atrial fibrillation   Admission 06/2020 for parotitis treated with antibiotics followed by ENT. Seen in clinic 08/2020 due to dyspnea. Recommended to continue Lasix '20mg'$  daily for mild diastolic dysfunction. Admitted 09/11/20 and treated for UTI.   Admitted 12/2020 with sepsis due to deep neck infection. Agitation and infection resolved with Decadron and abx. EKG with atrial fibrillation. Rate controlled at rest but 140s with activity. Low dose Metoprolol and OAC were discussed but not initiated. Eliquis subsequently started in outpatient setting.   Admitted 03/2021 with AMS and SIRS due to [place]. 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 At follow up 04/20/21 still short of breath with bending or turning which was chronic. No atrial fibrillation during this hospitalization.  Daughter contacted the office 09/15/21 noting increased episodes of atrial fib with fatigue, shortness of breath. Was seen by Laurann Montana NP on 7/26 and was in NSR. Labs were done and were OK. A 2 week event monitor was placed. This showed no Afib. Some nonsustained runs of SVT. Junctional rhythm with rate 55-60 seemed to correlate with symptoms.   He presented to the ED on 12/23/2021  with shortness of breath and fatigue.  Basic metabolic panel showed creatinine of 1.34.  BNP 285 which is lower than a month ago.  Troponin negative x2 chest x-ray showed no acute issue.  COVID test negative. Was seen in follow up with [name] PA-C- his lasix was increased for a couple of days. His Coreg was discontinued due to findings of bradycardia intermittently. Echo was done and showed normal LV EF with grade 2 diastolic dysfunction.  PFTS were also arranged. This looked OK with mildly reduced DLCO.    He was seen in ED in Dec with LLE cellulitis. Was placed on doxycycline. Also had recurrent parotiditis. States symptoms persisted and went back to ED 03/20/22. Switched doxycycline to clindamycin. Was noted in ED to have marked bradycardia with HR down to high 30s with sinus brady seen on ECG with rate 41. Since then BP and HR ok at home. Cellulitis improved. Denies any dizziness or syncope.   EKGs/Labs/Other Studies Reviewed:   The following studies were reviewed today:  Echo 04/2020 1. Left ventricular ejection fraction, by estimation, is 60 to 65%. The  left ventricle has normal function. The left ventricle has no regional  wall motion abnormalities. There is mild left ventricular hypertrophy.  Left ventricular diastolic parameters  are consistent with Grade I diastolic dysfunction (impaired relaxation).   2. Right ventricular systolic function is normal. The right ventricular  size is normal.   3. The mitral valve is normal in structure. Mild mitral valve  regurgitation. No evidence of mitral stenosis.   4. The aortic valve is tricuspid. Aortic valve regurgitation is trivial.  Mild aortic valve sclerosis is present, with no evidence of aortic valve  stenosis.   5. The inferior vena cava is normal in size with greater than 50%  respiratory variability, suggesting right atrial pressure of 3 mmHg.   Event monitor 10/14/21: Study Highlights      Normal sinus rhythm   3 runs of SVT  longest lasting 31 seconds.   Junctional rhythm with rate 55-60 bpm. Patient triggered events correlate with this   No Atrial fibrillation     Patch Wear Time:  8 days and 1 hours (2023-07-26T08:56:18-0400 to 2023-08-03T10:09:56-0400)   Patient had a min HR of 29 bpm, max HR of 102 bpm, and avg HR of 63 bpm. Predominant underlying rhythm was Sinus Rhythm. First Degree AV Block was present. Bundle Branch Block/IVCD was present. QRS morphology changes were present throughout recording. 3  Supraventricular Tachycardia runs occurred, the run with the fastest interval lasting 10 beats with a max rate of 102 bpm, the longest lasting 31.4 secs with an avg rate of 96 bpm. Junctional Rhythm was present. Junctional Rhythm was detected within +/-  45 seconds of symptomatic patient event(s). Isolated SVEs were rare (<1.0%), SVE Couplets were rare (<1.0%), and SVE Triplets were rare (<1.0%). Isolated VEs were rare (<1.0%), and no VE Couplets or VE Triplets were present.  Echo 01/14/22: IMPRESSIONS     1. Left ventricular ejection fraction, by estimation, is 65 to 70%. The  left ventricle has normal function.  The left ventricle has no regional  wall motion abnormalities. There is mild left ventricular hypertrophy.  Left ventricular diastolic parameters  are consistent with Grade II diastolic dysfunction (pseudonormalization).  Elevated left atrial pressure.   2. Right ventricular systolic function is normal. The right ventricular  size is normal. Tricuspid regurgitation signal is inadequate for assessing  PA pressure.   3. The mitral valve is normal in structure. No evidence of mitral valve  regurgitation. No evidence of mitral stenosis.   4. The aortic valve was not well visualized. Aortic valve regurgitation  is not visualized. No aortic stenosis is present.    EKG:  EKG is not ordered today.  The ekg ordered 03/20/22 demonstrates sinus brady rate 41 bpm with first degree AV block and LBBB. I have  personally reviewed and interpreted this study.   Recent Labs: 04/04/2021: ALT 28 09/22/2021: Magnesium 2.2; TSH 1.840 12/23/2021: B Natriuretic Peptide 285.7 03/20/2022: BUN 27; Creatinine, Ser 1.17; Hemoglobin 15.0; Platelets 246; Potassium 4.0; Sodium 138  Recent Lipid Panel No results found for: "CHOL", "TRIG", "HDL", "CHOLHDL", "VLDL", "LDLCALC", "LDLDIRECT"  Risk Assessment/Calculations:   CHA2DS2-VASc Score = 5   This indicates a 7.2% annual risk of stroke. The patient's score is based upon: CHF History: 1 HTN History: 1 Diabetes History: 0 Stroke History: 0 Vascular Disease History: 1 Age Score: 2 Gender Score: 0     Home Medications   Current Meds  Medication Sig   apixaban (ELIQUIS) 5 MG TABS tablet Take 1 tablet (5 mg total) by mouth 2 (two) times daily.   clindamycin (CLEOCIN) 300 MG capsule Take 1 capsule (300 mg total) by mouth 3 (three) times daily.   donepezil (ARICEPT) 10 MG tablet Take 10 mg by mouth at bedtime.   fesoterodine (TOVIAZ) 8 MG TB24 tablet Take 8 mg by mouth at bedtime.   fluticasone (FLONASE) 50 MCG/ACT nasal spray Place 2 sprays into both nostrils daily as needed for allergies.   furosemide (LASIX) 20 MG tablet Take 1 tablet (20 mg total) by mouth daily.   ketoconazole (NIZORAL) 2 % cream Apply 1 application topically daily.   lisinopril (ZESTRIL) 20 MG tablet Take 20 mg by mouth at bedtime.   mirabegron ER (MYRBETRIQ) 50 MG TB24 tablet Take 50 mg by mouth every morning.   Misc Natural Products (OSTEO BI-FLEX/5-LOXIN ADVANCED) TABS Take 1 tablet by mouth 2 (two) times daily.   Multiple Vitamin (MULTIVITAMIN WITH MINERALS) TABS tablet Take 1 tablet by mouth every morning.   nitrofurantoin, macrocrystal-monohydrate, (MACROBID) 100 MG capsule Take 1 capsule (100 mg total) by mouth in the morning.   Omega-3 Fatty Acids (FISH OIL) 1200 MG CAPS Take 1,200 mg by mouth 2 (two) times daily.   PRESCRIPTION MEDICATION Inhale into the lungs at bedtime. CPAP    rosuvastatin (CRESTOR) 10 MG tablet Take 0.5 tablets (5 mg total) by mouth every other day.     Review of Systems      All other systems reviewed and are otherwise negative except as noted above.  Physical Exam    VS:  BP 136/80   Pulse 80   Ht '5\' 10"'$  (1.778 m)   Wt 203 lb 9.6 oz (92.4 kg)   SpO2 96%   BMI 29.21 kg/m  , BMI Body mass index is 29.21 kg/m.  Wt Readings from Last 3 Encounters:  03/25/22 203 lb 9.6 oz (92.4 kg)  03/20/22 200 lb (90.7 kg)  02/07/22 205 lb 6.4 oz (93.2 kg)    GEN:  Well nourished, overweight, well developed, in no acute distress. HEENT: normal. Neck: Supple, no JVD, carotid bruits, or masses. Cardiac: RRR, no murmurs, rubs, or gallops. No clubbing, cyanosis, edema.  Radials/PT 2+ and equal bilaterally.  Respiratory:  Respirations regular and unlabored, clear to auscultation bilaterally. GI: Soft, nontender, nondistended. MS: No deformity or atrophy. Skin: Warm and dry, no rash. Neuro:  Strength and sensation are intact. Psych: Normal affect.  Assessment & Plan    Atrial fibrillation / Hypercoagulable state  Had Afib during hospitalization in November 2022 when septic. No documented recurrence since then. In fact event monitor in July showed no afib. Remains on Eliquis. Beta blocker discontinued due bradycardia.   Chronic diastolic heart failure - Weight is stable compared to last clinic visit. No edema, orthopnea, PND. Continue Lasix '20mg'$  QD.  Low sodium diet, fluid restriction <2L, and daily weights encouraged.   Chronic SOB -  multifactorial. Does not appear to be significantly volume overloaded. Symptoms did not change with diuresis. CXR and CT before were OK. PFTs only mildly abnormal. 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 I have made no changes in his medical therapy today. Will monitor for now.   HTN - BP well controlled.    5.  Sinus  brady possibly related to increase in vagal tone with acute illness. No clear symptoms. Monitor this past summer without sustained brady. On no rate slowing meds. No indication for pacermaker at this time. Monitor HR/BP at home. Let us know if develops symptoms of dizziness or syncope.  Disposition: Follow up  3 months with Briony Parveen Martinique, MD  Signed, Varun Jourdan Martinique, MD 03/25/2022, 10:42 AM [place]

## 2022-05-18 IMAGING — CT CT HEAD W/O CM
4 series · 16 of 47 positions shown, 18 images · non-contrast
Comparison: 09/11/2020

CLINICAL DATA: Altered mental status

EXAM:
CT HEAD WITHOUT CONTRAST
CT MAXILLOFACIAL WITHOUT CONTRAST
TECHNIQUE: Multidetector CT imaging of the head and maxillofacial structures
were performed using the standard protocol without intravenous
contrast. Multiplanar CT image reconstructions of the maxillofacial
structures were also generated.
RADIATION DOSE REDUCTION: This exam was performed according to the
departmental dose-optimization program which includes automated
exposure control, adjustment of the mA and/or kV according to
patient size and/or use of iterative reconstruction technique.

[Series 3: head without · axial · non-contrast · 0.45mm/px · z∈[-121,+14]mm · 7 of 37 slices shown, 9 images]
[im 5/37  brain]
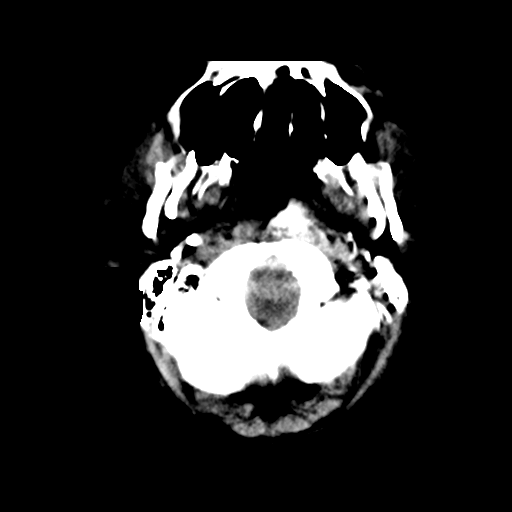
[im 5/37  bone]
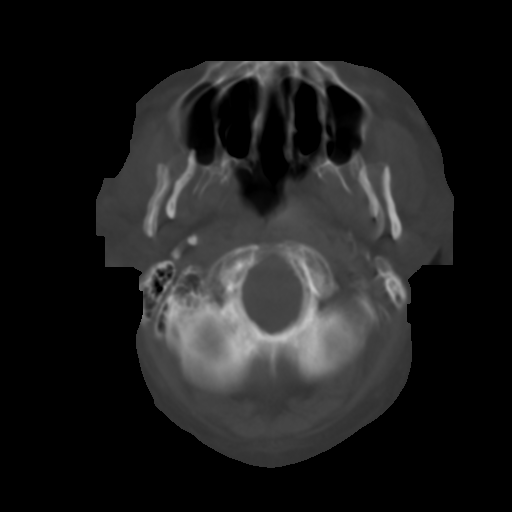
[im 10/37  brain]
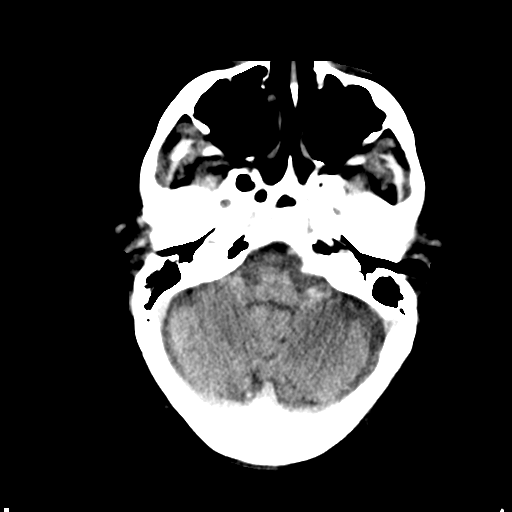
[im 14/37  brain]
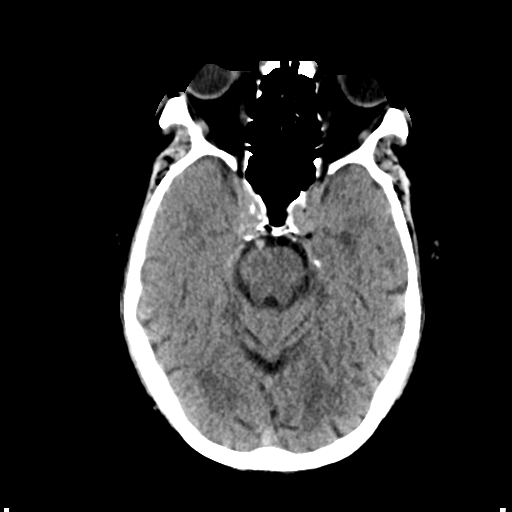
[im 19/37  brain]
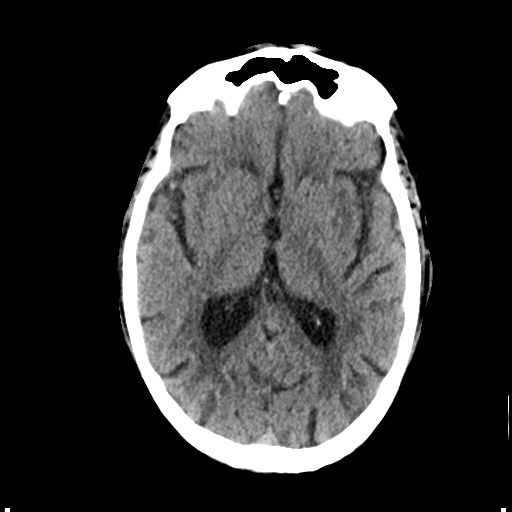
[im 23/37  brain]
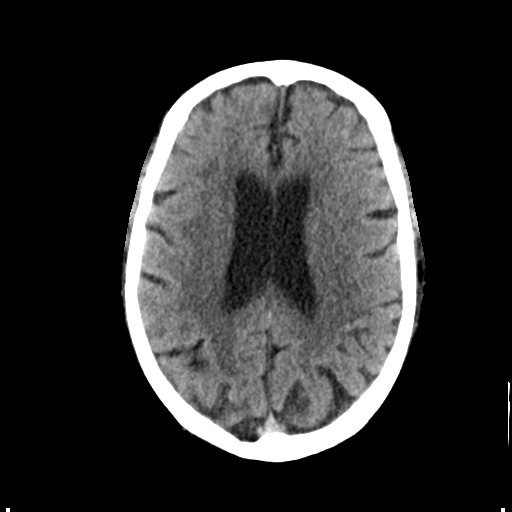
[im 23/37  bone]
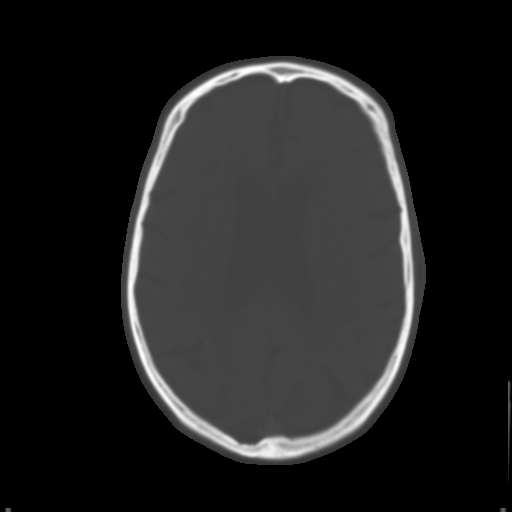
[im 28/37  brain]
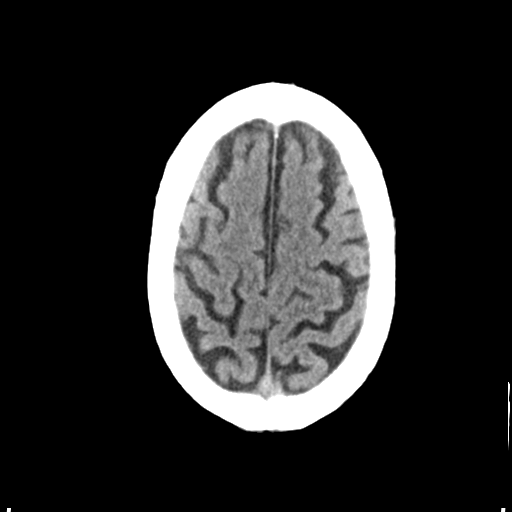
[im 32/37  brain]
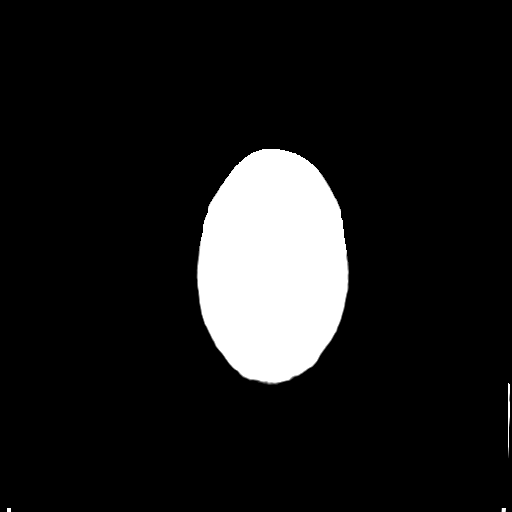

[Series 4: head bone · axial · 0.45mm/px · z∈[-123,-87]mm · 3 of 92 slices shown]
[im 10/92  bone]
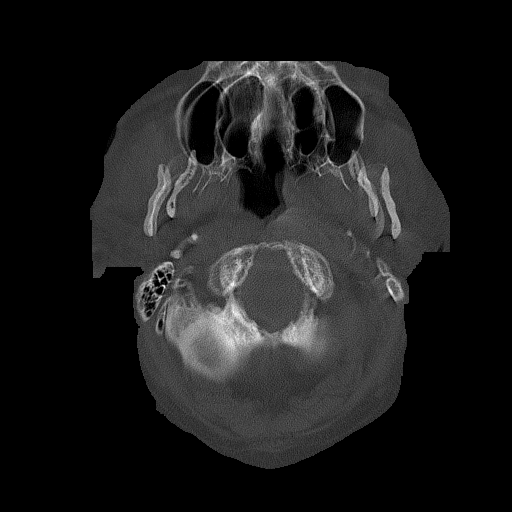
[im 19/92  bone]
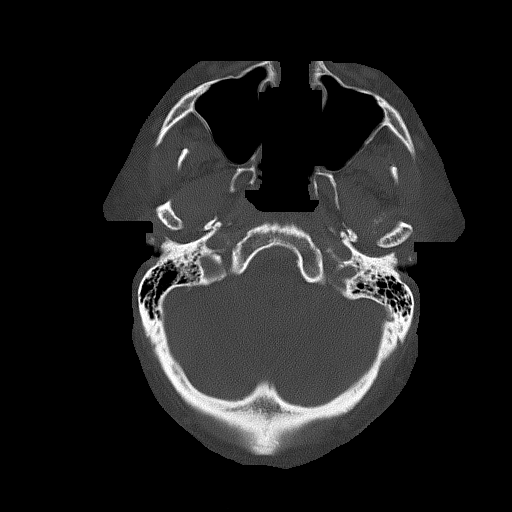
[im 28/92  bone]
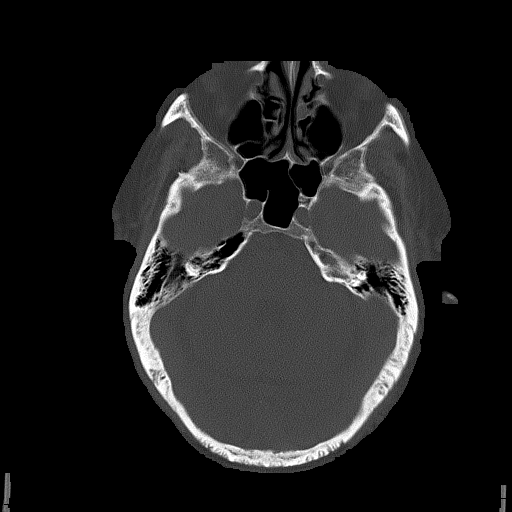

[Series 5: head without cor · coronal · non-contrast · 0.35mm/px · 3 of 71 slices shown]
[im 24/71  brain]
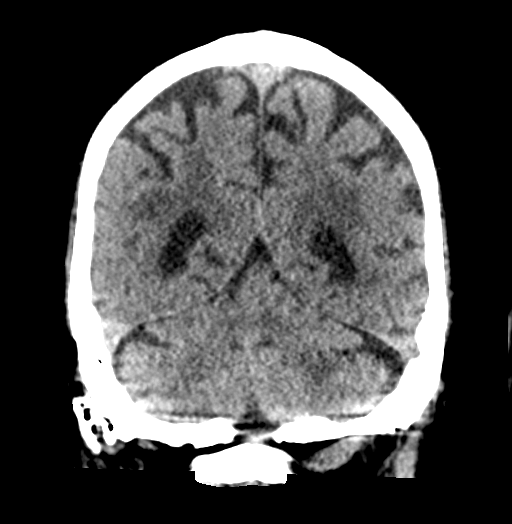
[im 32/71  brain]
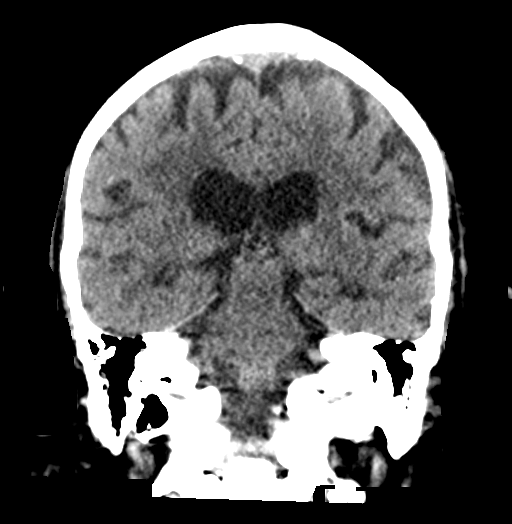
[im 39/71  brain]
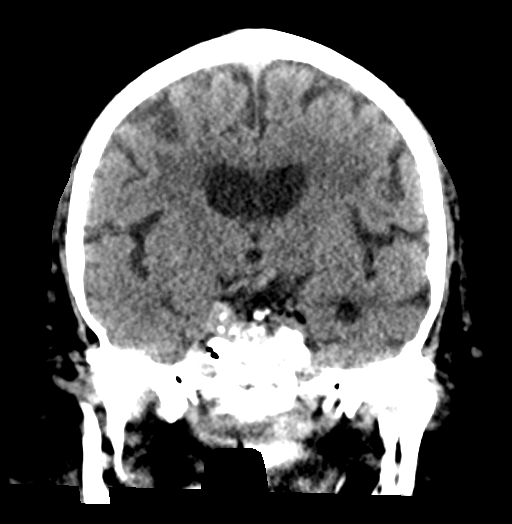

[Series 6: head without sag · sagittal · non-contrast · 0.36mm/px · 3 of 58 slices shown]
[im 20/58  brain]
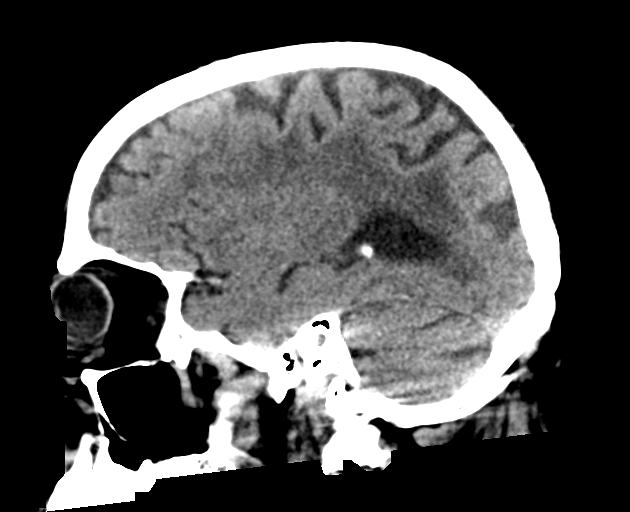
[im 29/58  brain]
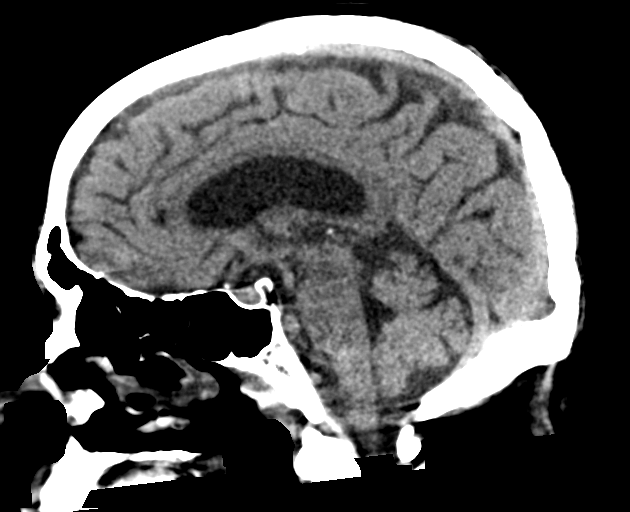
[im 39/58  brain]
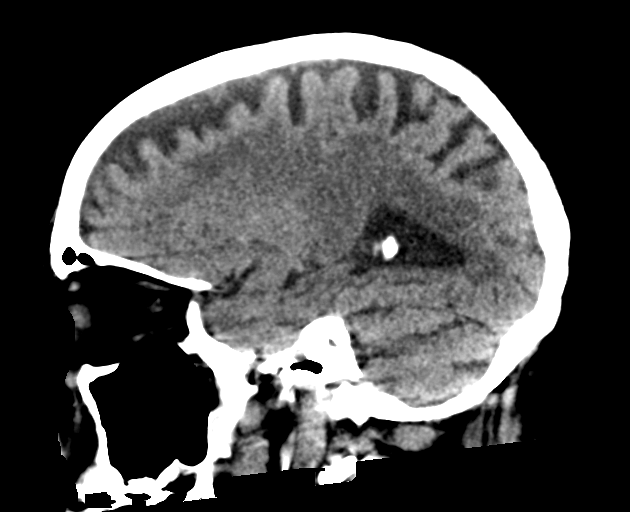

[16 of 47 positions shown; findings below may reference images not displayed]

FINDINGS: CT HEAD FINDINGS

Brain: No evidence of acute infarction, hemorrhage, hydrocephalus,
extra-axial collection or mass lesion/mass effect. Mild atrophic
changes and chronic white matter ischemic changes are noted.

Vascular: No hyperdense vessel or unexpected calcification.

Skull: Normal. Negative for fracture or focal lesion.

Other: None.

CT MAXILLOFACIAL FINDINGS

Osseous: No acute bony abnormality is noted.

Orbits: Orbits and their contents are within normal limits.

Sinuses: Paranasal sinuses show no mucosal thickening or air-fluid
levels.

Soft tissues: Surrounding soft tissue structures show no acute
abnormality. Specifically the parotid glands are within normal
limits bilaterally.
IMPRESSION: CT of the head: Chronic atrophic and ischemic changes without acute
abnormality.

CT of the maxillofacial bones: No acute bony or soft tissue
abnormality is noted.

## 2022-06-03 ENCOUNTER — Encounter: Payer: Self-pay | Admitting: Podiatry

## 2022-06-03 ENCOUNTER — Ambulatory Visit (INDEPENDENT_AMBULATORY_CARE_PROVIDER_SITE_OTHER): Payer: Medicare Other | Admitting: Podiatry

## 2022-06-03 DIAGNOSIS — M79674 Pain in right toe(s): Secondary | ICD-10-CM

## 2022-06-03 DIAGNOSIS — B351 Tinea unguium: Secondary | ICD-10-CM

## 2022-06-03 DIAGNOSIS — M79675 Pain in left toe(s): Secondary | ICD-10-CM | POA: Diagnosis not present

## 2022-06-03 DIAGNOSIS — Z7901 Long term (current) use of anticoagulants: Secondary | ICD-10-CM | POA: Diagnosis not present

## 2022-06-03 NOTE — Progress Notes (Signed)
  Subjective:  Patient ID: HILDA STOLLE, male    DOB: [birth year],   MRN: [id number]  Chief Complaint  Patient presents with   Nail Problem     Routine foot care    82 y.o. male presents for concern of thickened elongated and painful nails that are difficult to trim. Requesting to have them trimmed today.  Does relates some burning sensation in feet that come and go.  He is not diabetic but is currently on Eliquis.   PCP:  Eartha Inch, MD    . Denies any other pedal complaints. Denies n/v/f/c.   Past Medical History:  Diagnosis Date   Alzheimer disease (HCC)    Chronic kidney disease    mild insuffiency   Coronary artery disease    LHC 4/10: Mid LAD 40-50%, then 70%, OM1 20-30%, EF 65%. Mid LAD FFR 0.89 (not hemodynamically significant). Medical therapy was continued.   Dysuria 08/10/2020   Hyperlipidemia    LBBB (left bundle branch block) 06/2018   Obstructive sleep apnea     Objective:  Physical Exam: Vascular: DP/PT pulses 2/4 bilateral. CFT <3 seconds. Absent hair growth on digits. Edema noted to bilateral lower extremities. Xerosis noted bilaterally.  Skin. No lacerations or abrasions bilateral feet. Nails 1-5 bilateral  are thickened discolored and elongated with subungual debris.  Mild erythema noted over dorsum of fifth digit appear to be rubbing from shoes.  Musculoskeletal: MMT 5/5 bilateral lower extremities in DF, PF, Inversion and Eversion. Deceased ROM in DF of ankle joint. Hammered digits 2-5 bilateral.  Neurological: Sensation intact to light touch. Protective sensation intact bilateral.    Assessment:   1. Pain due to onychomycosis of toenails of both feet   2. Chronic anticoagulation       Plan:  Patient was evaluated and treated and all questions answered. -Discussed and educated patient on foot care, especially with  regards to the vascular, neurological and musculoskeletal systems.  -Discussed supportive shoes at all times and checking feet  regularly.  -Mechanically debrided all nails 1-5 bilateral using sterile nail nipper and filed with dremel without incident  -Answered all patient questions -Patient to return  in 3 months for at risk foot care -Patient advised to call the office if any problems or questions arise in the meantime.   Louann Sjogren, DPM

## 2022-06-28 ENCOUNTER — Ambulatory Visit: Payer: Medicare Other | Admitting: Physician Assistant

## 2022-08-11 ENCOUNTER — Encounter: Payer: Self-pay | Admitting: Podiatry

## 2022-08-11 ENCOUNTER — Ambulatory Visit (INDEPENDENT_AMBULATORY_CARE_PROVIDER_SITE_OTHER): Payer: Medicare Other | Admitting: Podiatry

## 2022-08-11 DIAGNOSIS — B351 Tinea unguium: Secondary | ICD-10-CM

## 2022-08-11 DIAGNOSIS — M79674 Pain in right toe(s): Secondary | ICD-10-CM

## 2022-08-11 DIAGNOSIS — Z7901 Long term (current) use of anticoagulants: Secondary | ICD-10-CM

## 2022-08-11 DIAGNOSIS — M79675 Pain in left toe(s): Secondary | ICD-10-CM

## 2022-08-11 DIAGNOSIS — Q828 Other specified congenital malformations of skin: Secondary | ICD-10-CM | POA: Diagnosis not present

## 2022-08-11 NOTE — Progress Notes (Signed)
  Subjective:  Patient ID: [name] [name], male    DOB: April 15, 1940,   MRN: [id number]  Chief Complaint  Patient presents with   Nail Problem     Left great toe nail , right great toe pinky toe.     82 y.o. male presents for concern of thickened elongated and painful nails that are difficult to trim. Requesting to have them trimmed today. Cocnerned about left great toe crack as well as right pinky toenail.   Does relates some burning sensation in feet that come and go.  He is not diabetic but is currently on Eliquis.   PCP:  Eartha Inch, MD    . Denies any other pedal complaints. Denies n/v/f/c.   Past Medical History:  Diagnosis Date   Alzheimer disease (HCC)    Chronic kidney disease    mild insuffiency   Coronary artery disease    LHC 4/10: Mid LAD 40-50%, then 70%, OM1 20-30%, EF 65%. Mid LAD FFR 0.89 (not hemodynamically significant). Medical therapy was continued.   Dysuria 08/10/2020   Hyperlipidemia    LBBB (left bundle branch block) 06/2018   Obstructive sleep apnea     Objective:  Physical Exam: Vascular: DP/PT pulses 2/4 bilateral. CFT <3 seconds. Absent hair growth on digits. Edema noted to bilateral lower extremities. Xerosis noted bilaterally.  Skin. No lacerations or abrasions bilateral feet. Nails 1-5 bilateral  are thickened discolored and elongated with subungual debris.  Mild erythema noted over dorsum of fifth digit appear to be rubbing from shoes. Hyperkeratotic tissue noted bilateral plantar fifth metatarsals with cored areas noted.  Musculoskeletal: MMT 5/5 bilateral lower extremities in DF, PF, Inversion and Eversion. Deceased ROM in DF of ankle joint. Hammered digits 2-5 bilateral.  Neurological: Sensation intact to light touch. Protective sensation intact bilateral.    Assessment:   1. Pain due to onychomycosis of toenails of both feet   2. Chronic anticoagulation        Plan:  Patient was evaluated and treated and all questions  answered. -Discussed and educated patient on foot care, especially with  regards to the vascular, neurological and musculoskeletal systems.  -Discussed supportive shoes at all times and checking feet regularly.  -Mechanically debrided all nails 1-5 bilateral using sterile nail nipper and filed with dremel without incident  -Hyperkeratotic tissue x2 debrided bilateral fifth metatarsal heads without incident with chisel.  -Answered all patient questions -Patient to return  in 4 months for at risk foot care -Patient advised to call the office if any problems or questions arise in the meantime.   Louann Sjogren, DPM

## 2022-08-15 NOTE — Progress Notes (Signed)
Cardiology Office Note:    Date:  08/22/2022   ID:  [name] [name], DOB 1940/11/27, MRN [id number]  PCP:  Eartha Inch, MD   [facility] Providers Cardiologist:  Peter Swaziland, MD Cardiology APP:  Marcelino Duster, [place] { Referring MD: Eartha Inch, MD   Chief Complaint  Patient presents with   Follow-up    SOB    History of Present Illness:    [name] [name] is a 82 y.o. male with a hx of Alzheimer's disease, diastolic heart failure, OSA on CPAP, hypertension, hyperlipidemia, moderate nonobstructive CAD on cardiac catheterization in 2010, chronic shortness of breath, left bundle branch block, CKD stage III, and newly recognized A. fib.  FFR on LAD in 2010 was negative, CAD treated with medical therapy.  Nuclear stress test in May 2020 completed for evaluation of new LBBB on EKG was low risk with no evidence of ischemia.    He was treated for parotitis (ABX) and was admitted 5/29-07/31/20 for this. He was followed by ENT.   Patient was seen in our clinic on 09/07/2020 by Marjie Skiff, PA-C and complained of shortness of breath..  His daughter was also concerned because they were told he had CHF and A. fib.  Unclear CHF diagnosis, patient does have mild diastolic dysfunction.  He does take Lasix daily.  Ms. Irene Limbo did not feel his symptoms were consistent with CHF.  They discussed nuclear stress test to rule out an anginal equivalent.  Through shared decision making, they decided to try PT first.  In review of EKGs, artifact makes definitive interpretation difficult.  However, I agree that P waves are present, although possibly different morphologies.  Question wandering pacemaker and PACs.  He was in sinus rhythm in July 2022.  He was admitted 09/11/2020 and treated for UTI.  He was admitted 12/2020 with sepsis due to deep neck infection.  EKG consistent with atrial fibrillation. He was started on rate control and eliquis. 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 patient was seen 09/22/2021 for increasing episodes of A-fib with fatigue and shortness of breath.  He was in NSR in the office and 2-week heart monitor showed no A-fib.  He had some nonsustained runs of SVT and junctional rhythm with rates 55-60 that seem to correlate with symptoms.  Coreg was discontinued at follow-up.  Echocardiogram 01/14/2022 for shortness of breath and fatigue showed preserved LVEF with grade 2 DD.   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  During an ER visit 03/11/2022 he was bradycardic with heart rate down to the high 30s and sinus bradycardia on EKG with a heart rate 41.  He was seen by Dr. Swaziland 03/25/2022 with continued chronic shortness of breath.  He denied dizziness or syncope.  He presents today for scheduled follow-up.  His daughter is present, his main caregiver. Her aunt died in 06/06/2022 and they are still cleaning out her house which is 3 hours away.  The daughter is concerned that he continues to have dyspnea and fatigue, he takes 2 naps daily.  The patient reports that he mostly has shortness of breath when bending over.  He continues to walk as he can.  Although trips to the grocery store resulted in him being very tired.  Question if this is related to his paroxysmal junctional rhythm.  Overall I think he  is doing well for his age.  I will make no medication changes today.  He is euvolemic on exam.  I think the fatigue is likely just a natural progression of his current comorbid conditions, including dementia.   Past Medical History:  Diagnosis Date   Alzheimer disease (HCC)    Chronic kidney disease    mild insuffiency   Coronary artery disease    LHC 4/10: Mid LAD 40-50%, then 70%, OM1 20-30%, EF 65%. Mid LAD FFR 0.89 (not hemodynamically significant). Medical therapy was continued.   Dysuria 08/10/2020   Hyperlipidemia    LBBB  (left bundle branch block) 06/2018   Obstructive sleep apnea     Past Surgical History:  Procedure Laterality Date   CARDIAC CATHETERIZATION  05/02/2003   single vessel,moderate stenosis mid LAD   CARDIAC CATHETERIZATION  06/12/2008   continue med. therapy   right shoulder surgery     TOTAL HIP ARTHROPLASTY Left 09/14/2018   Procedure: LEFT TOTAL HIP ARTHROPLASTY ANTERIOR APPROACH;  Surgeon: Kathryne Hitch, MD;  Location: [facility];  Service: Orthopedics;  Laterality: Left;    Current Medications: Current Meds  Medication Sig   apixaban (ELIQUIS) 5 MG TABS tablet Take 1 tablet (5 mg total) by mouth 2 (two) times daily.   cholecalciferol (VITAMIN D3) 25 MCG (1000 UNIT) tablet Take 1,000 Units by mouth in the morning and at bedtime.   donepezil (ARICEPT) 10 MG tablet Take 10 mg by mouth at bedtime.   doxycycline (VIBRAMYCIN) 100 MG capsule Take 100 mg by mouth daily.   fesoterodine (TOVIAZ) 8 MG TB24 tablet Take 8 mg by mouth at bedtime.   fluticasone (FLONASE) 50 MCG/ACT nasal spray Place 2 sprays into both nostrils daily as needed for allergies.   furosemide (LASIX) 20 MG tablet Take 1 tablet (20 mg total) by mouth daily.   ketoconazole (NIZORAL) 2 % cream Apply 1 application topically daily.   lisinopril (ZESTRIL) 20 MG tablet Take 20 mg by mouth at bedtime.   mirabegron ER (MYRBETRIQ) 50 MG TB24 tablet Take 50 mg by mouth every morning.   Misc Natural Products (OSTEO BI-FLEX/5-LOXIN ADVANCED) TABS Take 1 tablet by mouth 2 (two) times daily.   Multiple Vitamin (MULTIVITAMIN WITH MINERALS) TABS tablet Take 1 tablet by mouth every morning.   nitrofurantoin, macrocrystal-monohydrate, (MACROBID) 100 MG capsule Take 1 capsule (100 mg total) by mouth in the morning.   Omega-3 Fatty Acids (FISH OIL) 1200 MG CAPS Take 1,200 mg by mouth 2 (two) times daily.   PRESCRIPTION MEDICATION Inhale into the lungs at bedtime. CPAP   rosuvastatin (CRESTOR) 10 MG tablet Take 0.5 tablets (5 mg total)  by mouth every other day.     Allergies:   Enablex [darifenacin hydrobromide er], Atorvastatin, Ceftriaxone, Ezetimibe, Levaquin [levofloxacin], Metoprolol, Oxybutynin, Sulfamethoxazole, Sulfasalazine, and Sulfonamide derivatives   Social History   Socioeconomic History   Marital status: Widowed    Spouse name: Not on file   Number of children: 2   Years of education: Not on file   Highest education level: Not on file  Occupational History   Occupation: [employer]  Tobacco Use   Smoking status: Former    Types: Pipe    Quit date: 02/28/1990    Years since quitting: 32.5   Smokeless tobacco: Never   Tobacco comments:    smoked pipe only   Vaping Use   Vaping Use: Never used  Substance and Sexual Activity   Alcohol use: No    Alcohol/week:  0.0 standard drinks of alcohol    Comment: rarely wine   Drug use: No   Sexual activity: Not on file  Other Topics Concern   Not on file  Social History Narrative   Not on file   Social Determinants of Health   Financial Resource Strain: Not on file  Food Insecurity: Not on file  Transportation Needs: Not on file  Physical Activity: Not on file  Stress: Not on file  Social Connections: Not on file     Family History: The patient's family history includes Cancer in his mother; Hypertension in his father and mother; Other in his father.  ROS:   Please see the history of present illness.     All other systems reviewed and are negative.  EKGs/Labs/Other Studies Reviewed:    The following studies were reviewed today:  Cardiac Studies & Procedures     STRESS TESTS  MYOCARDIAL PERFUSION IMAGING 07/20/2018  Narrative  Nuclear stress EF: 63%. The left ventricular ejection fraction is normal (55-65%).  There was no ST segment deviation noted during stress.  The study is normal.  This is a low risk study.   ECHOCARDIOGRAM  ECHOCARDIOGRAM COMPLETE 01/14/2022  Narrative ECHOCARDIOGRAM REPORT    Patient Name:    MARCELLIUS MONTAGNA Date of Exam: 01/14/2022 Medical Rec #:  [id number]       Height:       69.5 in Accession #:    [id number]      Weight:       199.5 lb Date of Birth:  [birth year]       BSA:          2.074 m Patient Age:    81 years        BP:           160/79 mmHg Patient Gender: M               HR:           69 bpm. Exam Location:  [address]  Procedure: 2D Echo, Cardiac Doppler, Color Doppler and Intracardiac Opacification Agent  Indications:    R06.9 DOE  History:        Patient has prior history of Echocardiogram examinations, most recent 05/22/2020. CAD, Arrythmias:LBBB; Risk Factors:Dyslipidemia.  Sonographer:    Thurman Coyer RDCS Referring Phys: [id number] HAO MENG  IMPRESSIONS   1. Left ventricular ejection fraction, by estimation, is 65 to 70%. The left ventricle has normal function. The left ventricle has no regional wall motion abnormalities. There is mild left ventricular hypertrophy. Left ventricular diastolic parameters are consistent with Grade II diastolic dysfunction (pseudonormalization). Elevated left atrial pressure. 2. Right ventricular systolic function is normal. The right ventricular size is normal. Tricuspid regurgitation signal is inadequate for assessing PA pressure. 3. The mitral valve is normal in structure. No evidence of mitral valve regurgitation. No evidence of mitral stenosis. 4. The aortic valve was not well visualized. Aortic valve regurgitation is not visualized. No aortic stenosis is present.  FINDINGS Left Ventricle: Left ventricular ejection fraction, by estimation, is 65 to 70%. The left ventricle has normal function. The left ventricle has no regional wall motion abnormalities. Definity contrast agent was given IV to delineate the left ventricular endocardial borders. The left ventricular internal cavity size was normal in size. There is mild left ventricular hypertrophy. Left ventricular diastolic parameters are consistent with Grade II  diastolic dysfunction (pseudonormalization). Elevated left atrial pressure.  Right Ventricle: The right ventricular size is normal. Right  vetricular wall thickness was not well visualized. Right ventricular systolic function is normal. Tricuspid regurgitation signal is inadequate for assessing PA pressure.  Left Atrium: Left atrial size was normal in size.  Right Atrium: Right atrial size was normal in size.  Pericardium: There is no evidence of pericardial effusion.  Mitral Valve: The mitral valve is normal in structure. No evidence of mitral valve regurgitation. No evidence of mitral valve stenosis.  Tricuspid Valve: The tricuspid valve is normal in structure. Tricuspid valve regurgitation is trivial.  Aortic Valve: The aortic valve was not well visualized. Aortic valve regurgitation is not visualized. No aortic stenosis is present.  Pulmonic Valve: The pulmonic valve was not well visualized. Pulmonic valve regurgitation is not visualized.  Aorta: The aortic root and ascending aorta are structurally normal, with no evidence of dilitation.  IAS/Shunts: The interatrial septum was not well visualized.   LEFT VENTRICLE PLAX 2D LVIDd:         4.10 cm   Diastology LVIDs:         2.40 cm   LV e' medial:    4.22 cm/s LV PW:         1.10 cm   LV E/e' medial:  22.4 LV IVS:        1.10 cm   LV e' lateral:   5.35 cm/s LVOT diam:     2.00 cm   LV E/e' lateral: 17.7 LV SV:         80 LV SV Index:   38 LVOT Area:     3.14 cm   RIGHT VENTRICLE RV Basal diam:  3.10 cm RV Mid diam:    3.10 cm RV S prime:     11.80 cm/s TAPSE (M-mode): 1.8 cm  LEFT ATRIUM             Index        RIGHT ATRIUM           Index LA diam:        3.50 cm 1.69 cm/m   RA Area:     14.10 cm LA Vol (A2C):   44.5 ml 21.45 ml/m  RA Volume:   30.90 ml  14.90 ml/m LA Vol (A4C):   30.5 ml 14.71 ml/m LA Biplane Vol: 37.7 ml 18.18 ml/m AORTIC VALVE LVOT Vmax:   120.00 cm/s LVOT Vmean:  77.700 cm/s LVOT VTI:     0.254 m  AORTA Ao Root diam: 3.60 cm Ao Asc diam:  3.60 cm  MITRAL VALVE MV Area (PHT): 3.50 cm     SHUNTS MV Decel Time: 217 msec     Systemic VTI:  0.25 m MV E velocity: 94.60 cm/s   Systemic Diam: 2.00 cm MV A velocity: 110.00 cm/s MV E/A ratio:  0.86  Epifanio Lesches MD Electronically signed by Epifanio Lesches MD Signature Date/Time: 01/14/2022/2:21:54 PM    Final    MONITORS  LONG TERM MONITOR (3-14 DAYS) 10/14/2021  Narrative   Normal sinus rhythm   3 runs of SVT longest lasting 31 seconds.   Junctional rhythm with rate 55-60 bpm. Patient triggered events correlate with this   No Atrial fibrillation   Patch Wear Time:  8 days and 1 hours (2023-07-26T08:56:18-0400 to 2023-08-03T10:09:56-0400)  Patient had a min HR of 29 bpm, max HR of 102 bpm, and avg HR of 63 bpm. Predominant underlying rhythm was Sinus Rhythm. First Degree AV Block was present. Bundle Branch Block/IVCD was present. QRS morphology changes were present throughout recording. 3  Supraventricular Tachycardia runs occurred, the run with the fastest interval lasting 10 beats with a max rate of 102 bpm, the longest lasting 31.4 secs with an avg rate of 96 bpm. Junctional Rhythm was present. Junctional Rhythm was detected within +/- 45 seconds of symptomatic patient event(s). Isolated SVEs were rare (<1.0%), SVE Couplets were rare (<1.0%), and SVE Triplets were rare (<1.0%). Isolated VEs were rare (<1.0%), and no VE Couplets or VE Triplets were present.            EKG:  EKG is not ordered today.   Recent Labs: 09/22/2021: Magnesium 2.2; TSH 1.840 12/23/2021: B Natriuretic Peptide 285.7 03/20/2022: BUN 27; Creatinine, Ser 1.17; Hemoglobin 15.0; Platelets 246; Potassium 4.0; Sodium 138  Recent Lipid Panel No results found for: "CHOL", "TRIG", "HDL", "CHOLHDL", "VLDL", "LDLCALC", "LDLDIRECT"   Risk Assessment/Calculations:                Physical Exam:    VS:  BP 138/84   Pulse 73   Ht  5\' 9"  (1.753 m)   Wt 209 lb 12.8 oz (95.2 kg)   SpO2 97%   BMI 30.98 kg/m     Wt Readings from Last 3 Encounters:  08/22/22 209 lb 12.8 oz (95.2 kg)  03/25/22 203 lb 9.6 oz (92.4 kg)  03/20/22 200 lb (90.7 kg)     GEN:  Well nourished, well developed in no acute distress HEENT: Normal NECK: No JVD; No carotid bruits LYMPHATICS: No lymphadenopathy CARDIAC: RRR, no murmurs, rubs, gallops RESPIRATORY:  Clear to auscultation without rales, wheezing or rhonchi  ABDOMEN: Soft, non-tender, non-distended MUSCULOSKELETAL:  No edema; No deformity  SKIN: Warm and dry NEUROLOGIC:  Alert and oriented x 3 PSYCHIATRIC:  Normal affect   ASSESSMENT:    1. Paroxysmal atrial fibrillation (HCC)   2. Sinus bradycardia   3. Chronic diastolic heart failure (HCC)   4. Chronic anticoagulation   5. Chronic dyspnea   6. Primary hypertension    PLAN:    In order of problems listed above:  Sinus bradycardia No rate controlling medications Heart monitor did not show sustained bradycardia Will continue to monitor -May continue having junctional rhythm, wonder if this is contributing to his fatigue   Paroxysmal atrial fibrillation -First noted during hospitalization November 2022 in the setting of sepsis-no documented recurrence since then with a heart monitor in July 2023 showing no A-fib -No beta-blocker given bradycardia   Chronic diastolic heart failure Grade 2 DD with preserved EF on recent echo Maintained on 20 mg Lasix daily He is euvolemic today   Need for anticoagulation This patients CHA2DS2-VASc Score and unadjusted Ischemic Stroke Rate (% per year) is equal to 7.2 % stroke rate/year from a score of 5 (CHF, 2age, CAD, HTN). 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 Swaziland.   Medication Adjustments/Labs and Tests Ordered: Current medicines are reviewed at length with the patient today.  Concerns regarding medicines are outlined above.  No orders of the defined types were placed in this encounter.  No orders of the defined types were placed in this encounter.   Patient Instructions  Medication Instructions:  The current medical regimen is effective;  continue present plan and medications as directed.  Please refer to the Current Medication list given to you today.  *If you need a refill on your cardiac medications before your next appointment, please call your pharmacy*  Lab Work: NONE ordered at this time of appointment   If you have labs (blood work) drawn today and your tests are completely normal, you will receive your results only by:  MyChart Message (if you have MyChart) OR A paper copy in the mail If you have any lab test that is abnormal or we need to change your treatment, we will call you to review the results.  Follow-Up: At [facility], you and your health needs are our priority.  As part of our continuing mission to provide you with exceptional heart care, we have created designated Provider Care Teams.  These Care Teams include your primary Cardiologist (physician) and Advanced Practice Providers (APPs -  Physician Assistants and Nurse Practitioners) who all work together to provide you with the care you need, when you need it.  Your next appointment:   4-5 month(s)  Provider:   Peter Swaziland, MD     Other Instructions    Signed, Marcelino Duster, [place]  08/22/2022 11:47 AM    [facility]

## 2022-08-22 ENCOUNTER — Ambulatory Visit: Payer: Medicare Other | Attending: Physician Assistant | Admitting: Physician Assistant

## 2022-08-22 ENCOUNTER — Encounter: Payer: Self-pay | Admitting: Physician Assistant

## 2022-08-22 VITALS — BP 138/84 | HR 73 | Ht 69.0 in | Wt 209.8 lb

## 2022-08-22 DIAGNOSIS — I5032 Chronic diastolic (congestive) heart failure: Secondary | ICD-10-CM | POA: Diagnosis present

## 2022-08-22 DIAGNOSIS — R0609 Other forms of dyspnea: Secondary | ICD-10-CM

## 2022-08-22 DIAGNOSIS — R001 Bradycardia, unspecified: Secondary | ICD-10-CM

## 2022-08-22 DIAGNOSIS — I10 Essential (primary) hypertension: Secondary | ICD-10-CM | POA: Diagnosis present

## 2022-08-22 DIAGNOSIS — I480 Paroxysmal atrial fibrillation: Secondary | ICD-10-CM

## 2022-08-22 DIAGNOSIS — Z7901 Long term (current) use of anticoagulants: Secondary | ICD-10-CM | POA: Diagnosis present

## 2022-08-22 NOTE — Patient Instructions (Signed)
Medication Instructions:  The current medical regimen is effective;  continue present plan and medications as directed. Please refer to the Current Medication list given to you today.  *If you need a refill on your cardiac medications before your next appointment, please call your pharmacy*  Lab Work: NONE ordered at this time of appointment   If you have labs (blood work) drawn today and your tests are completely normal, you will receive your results only by:  MyChart Message (if you have MyChart) OR A paper copy in the mail If you have any lab test that is abnormal or we need to change your treatment, we will call you to review the results.  Follow-Up: At [facility], you and your health needs are our priority.  As part of our continuing mission to provide you with exceptional heart care, we have created designated Provider Care Teams.  These Care Teams include your primary Cardiologist (physician) and Advanced Practice Providers (APPs -  Physician Assistants and Nurse Practitioners) who all work together to provide you with the care you need, when you need it.  Your next appointment:   4-5 month(s)  Provider:   Peter Swaziland, MD     Other Instructions

## 2022-10-05 ENCOUNTER — Emergency Department (HOSPITAL_COMMUNITY): Payer: Medicare Other

## 2022-10-05 ENCOUNTER — Other Ambulatory Visit: Payer: Self-pay

## 2022-10-05 ENCOUNTER — Emergency Department (HOSPITAL_COMMUNITY)
Admission: EM | Admit: 2022-10-05 | Discharge: 2022-10-06 | Disposition: A | Discharge status: Home / Self Care | Payer: Medicare Other | Attending: Emergency Medicine | Admitting: Emergency Medicine

## 2022-10-05 ENCOUNTER — Encounter (HOSPITAL_COMMUNITY): Payer: Self-pay

## 2022-10-05 DIAGNOSIS — I2510 Atherosclerotic heart disease of native coronary artery without angina pectoris: Secondary | ICD-10-CM | POA: Insufficient documentation

## 2022-10-05 DIAGNOSIS — N189 Chronic kidney disease, unspecified: Secondary | ICD-10-CM | POA: Diagnosis not present

## 2022-10-05 DIAGNOSIS — Z7901 Long term (current) use of anticoagulants: Secondary | ICD-10-CM | POA: Insufficient documentation

## 2022-10-05 DIAGNOSIS — R0602 Shortness of breath: Secondary | ICD-10-CM | POA: Diagnosis present

## 2022-10-05 DIAGNOSIS — F0390 Unspecified dementia without behavioral disturbance: Secondary | ICD-10-CM | POA: Insufficient documentation

## 2022-10-05 DIAGNOSIS — I509 Heart failure, unspecified: Secondary | ICD-10-CM | POA: Diagnosis not present

## 2022-10-05 LAB — CBC WITH DIFFERENTIAL/PLATELET
Abs Immature Granulocytes: 0.02 10*3/uL (ref 0.00–0.07)
Basophils Absolute: 0 10*3/uL (ref 0.0–0.1)
Basophils Relative: 1 %
Eosinophils Absolute: 0.2 10*3/uL (ref 0.0–0.5)
Eosinophils Relative: 3 %
HCT: 41.3 % (ref 39.0–52.0)
Hemoglobin: 13.8 g/dL (ref 13.0–17.0)
Immature Granulocytes: 0 %
Lymphocytes Relative: 21 %
Lymphs Abs: 1 10*3/uL (ref 0.7–4.0)
MCH: 31.3 pg (ref 26.0–34.0)
MCHC: 33.4 g/dL (ref 30.0–36.0)
MCV: 93.7 fL (ref 80.0–100.0)
Monocytes Absolute: 0.4 10*3/uL (ref 0.1–1.0)
Monocytes Relative: 8 %
Neutro Abs: 3.1 10*3/uL (ref 1.7–7.7)
Neutrophils Relative %: 67 %
Platelets: 224 10*3/uL (ref 150–400)
RBC: 4.41 MIL/uL (ref 4.22–5.81)
RDW: 12.6 % (ref 11.5–15.5)
WBC: 4.6 10*3/uL (ref 4.0–10.5)
nRBC: 0 % (ref 0.0–0.2)

## 2022-10-05 LAB — TROPONIN I (HIGH SENSITIVITY): Troponin I (High Sensitivity): 8 ng/L (ref <18)

## 2022-10-05 LAB — COMPREHENSIVE METABOLIC PANEL
ALT: 22 U/L (ref 0–44)
AST: 26 U/L (ref 15–41)
Albumin: 3.7 g/dL (ref 3.5–5.0)
Alkaline Phosphatase: 58 U/L (ref 38–126)
Anion gap: 12 (ref 5–15)
BUN: 18 mg/dL (ref 8–23)
CO2: 27 mmol/L (ref 22–32)
Calcium: 10.5 mg/dL — ABNORMAL HIGH (ref 8.9–10.3)
Chloride: 102 mmol/L (ref 98–111)
Creatinine, Ser: 1.23 mg/dL (ref 0.61–1.24)
GFR, Estimated: 58 mL/min — ABNORMAL LOW (ref >60)
Glucose, Bld: 187 mg/dL — ABNORMAL HIGH (ref 70–99)
Potassium: 4.3 mmol/L (ref 3.5–5.1)
Sodium: 141 mmol/L (ref 135–145)
Total Bilirubin: 0.5 mg/dL (ref 0.3–1.2)
Total Protein: 6.5 g/dL (ref 6.5–8.1)

## 2022-10-05 LAB — BRAIN NATRIURETIC PEPTIDE: B Natriuretic Peptide: 150.8 pg/mL — ABNORMAL HIGH (ref 0.0–100.0)

## 2022-10-05 NOTE — ED Triage Notes (Signed)
PER EMS: Pt is from home with c/o weakness and shortness of breath on exertion x 2 weeks. He states he was eating dinner tonight he felt very short of breath and was shaky. Hx of CHF. Denies any pain. Denies cough. EMS put him on 2L [place] for comfort. Pt speaking clear complete sentences on arrival.  BP- 170/86, HR-70, 98% RA. CBG-204

## 2022-10-05 NOTE — ED Provider Notes (Signed)
[place] EMERGENCY DEPARTMENT AT [facility] Provider Note   CSN: [id number] Arrival date & time: 10/05/22  2154     History {Add pertinent medical, surgical, social history, OB history to HPI:1} Chief Complaint  Patient presents with   Shortness of Breath    [name] [name] is a 82 y.o. male.  The history is provided by the patient and medical records.  Shortness of Breath  82 y.o. M with hx of CHF, dementia, CAD, PAF on eliquis, CKD, presenting to the ED for SOB.  States for the past 2 weeks he has been feeling short of breath, mostly with moving or exertional activity.  This has been getting better if he rests appropriately.  Over the past few days to see more pronounced and is taking him longer to recover.  States when he tries to lay flat he feels "restless".  He denies any specific chest pain.  No abdominal pain, nausea, vomiting, or diarrhea.  He has not had any fevers.  Does have known history of CHF and has been taking his Lasix as prescribed.  He is followed by cardiology, Dr. [place]azil[place].  Home Medications Prior to Admission medications   Medication Sig Start Date End Date Taking? Authorizing Provider  apixaban (ELIQUIS) 5 MG TABS tablet Take 1 tablet (5 mg total) by mouth 2 (two) times daily. 03/14/22   [place], Peter M, MD  cholecalciferol (VITAMIN D3) 25 MCG (1000 UNIT) tablet Take 1,000 Units by mouth in the morning and at bedtime.    [provider]  clindamycin (CLEOCIN) 300 MG capsule Take 1 capsule (300 mg total) by mouth 3 (three) times daily. Patient not taking: Reported on 08/22/2022 03/20/22   Benjiman Core, MD  donepezil (ARICEPT) 10 MG tablet Take 10 mg by mouth at bedtime. 12/01/16   [provider]  doxycycline (VIBRAMYCIN) 100 MG capsule Take 100 mg by mouth daily. 08/13/22   [provider]  fesoterodine (TOVIAZ) 8 MG TB24 tablet Take 8 mg by mouth at bedtime. 12/13/18   [provider]  fluticasone (FLONASE) 50  MCG/ACT nasal spray Place 2 sprays into both nostrils daily as needed for allergies. 05/15/17   [provider]  furosemide (LASIX) 20 MG tablet Take 1 tablet (20 mg total) by mouth daily. 09/07/20   Marjie Skiff E, PA-C  ketoconazole (NIZORAL) 2 % cream Apply 1 application topically daily. 02/27/21   [provider]  lisinopril (ZESTRIL) 20 MG tablet Take 20 mg by mouth at bedtime. 08/23/18   [provider]  mirabegron ER (MYRBETRIQ) 50 MG TB24 tablet Take 50 mg by mouth every morning.    [provider]  Misc Natural Products (OSTEO BI-FLEX/5-LOXIN ADVANCED) TABS Take 1 tablet by mouth 2 (two) times daily.    [provider]  Multiple Vitamin (MULTIVITAMIN WITH MINERALS) TABS tablet Take 1 tablet by mouth every morning.    [provider]  nitrofurantoin, macrocrystal-monohydrate, (MACROBID) 100 MG capsule Take 1 capsule (100 mg total) by mouth in the morning. 01/23/21   Almon Hercules, MD  Omega-3 Fatty Acids (FISH OIL) 1200 MG CAPS Take 1,200 mg by mouth 2 (two) times daily.    [provider]  PRESCRIPTION MEDICATION Inhale into the lungs at bedtime. CPAP    [provider]  rosuvastatin (CRESTOR) 10 MG tablet Take 0.5 tablets (5 mg total) by mouth every other day. 06/28/21   [place], Peter M, MD      Allergies    Amanda Cockayne [  darifenacin hydrobromide er], Atorvastatin, Ceftriaxone, Ezetimibe, Levaquin [levofloxacin], Metoprolol, Oxybutynin, Sulfamethoxazole, Sulfasalazine, and Sulfonamide derivatives    Review of Systems   Review of Systems  Respiratory:  Positive for shortness of breath.     Physical Exam Updated Vital Signs BP (!) 161/85 (BP Location: Right Arm)   Pulse 74   Resp 19   Ht 5\' 10"  (1.778 m)   Wt 92.1 kg   SpO2 100%   BMI 29.13 kg/m   Physical Exam Vitals and nursing note reviewed.  Constitutional:      Appearance: He is well-developed.  HENT:     Head: Normocephalic and atraumatic.  Eyes:      General: No scleral icterus.       Right eye: No discharge.     Conjunctiva/sclera: Conjunctivae normal.     Pupils: Pupils are equal, round, and reactive to light.  Cardiovascular:     Rate and Rhythm: Normal rate and regular rhythm.     Heart sounds: Normal heart sounds.  Pulmonary:     Effort: Pulmonary effort is normal.     Breath sounds: Normal breath sounds. No wheezing or rhonchi.  Abdominal:     General: Bowel sounds are normal.     Palpations: Abdomen is soft.  Musculoskeletal:        General: Normal range of motion.     Cervical back: Normal range of motion.     Comments: Compression stockings in place, some mild edema noted above these  Skin:    General: Skin is warm and dry.  Neurological:     Mental Status: He is alert and oriented to person, place, and time.     ED Results / Procedures / Treatments   Labs (all labs ordered are listed, but only abnormal results are displayed) Labs Reviewed  CBC WITH DIFFERENTIAL/PLATELET  COMPREHENSIVE METABOLIC PANEL  BRAIN NATRIURETIC PEPTIDE  TROPONIN I (HIGH SENSITIVITY)    EKG None  Radiology No results found.  Procedures Procedures  {Document cardiac monitor, telemetry assessment procedure when appropriate:1}  Medications Ordered in ED Medications - No data to display  ED Course/ Medical Decision Making/ A&P   {   Click here for ABCD2, HEART and other calculatorsREFRESH Note before signing :1}                              Medical Decision Making Amount and/or Complexity of Data Reviewed Labs: ordered. Radiology: ordered.   ***  {Document critical care time when appropriate:1} {Document review of labs and clinical decision tools ie heart score, Chads2Vasc2 etc:1}  {Document your independent review of radiology images, and any outside records:1} {Document your discussion with family members, caretakers, and with consultants:1} {Document social determinants of health affecting pt's care:1} {Document  your decision making why or why not admission, treatments were needed:1} Final Clinical Impression(s) / ED Diagnoses Final diagnoses:  None    Rx / DC Orders ED Discharge Orders     None

## 2022-10-06 DIAGNOSIS — R0602 Shortness of breath: Secondary | ICD-10-CM | POA: Diagnosis not present

## 2022-10-06 NOTE — Discharge Instructions (Signed)
Your work-up today was reassuring. Please follow-up with your primary care doctor and/or cardiology. Return here for new concerns.

## 2022-10-17 ENCOUNTER — Other Ambulatory Visit: Payer: Self-pay | Admitting: Cardiology

## 2022-10-17 DIAGNOSIS — I480 Paroxysmal atrial fibrillation: Secondary | ICD-10-CM

## 2022-10-17 NOTE — Telephone Encounter (Signed)
Eliquis 5mg  refill request received. Patient is 82 years old, weight-92.1kg, Crea-1.23 on 10/05/22, Diagnosis-Afib, and last seen by Micah Flesher on 08/22/22. Dose is appropriate based on dosing criteria. Will send in refill to requested pharmacy.

## 2022-10-27 ENCOUNTER — Ambulatory Visit (INDEPENDENT_AMBULATORY_CARE_PROVIDER_SITE_OTHER): Payer: Medicare Other | Admitting: Podiatry

## 2022-10-27 ENCOUNTER — Encounter: Payer: Self-pay | Admitting: Podiatry

## 2022-10-27 DIAGNOSIS — M79674 Pain in right toe(s): Secondary | ICD-10-CM | POA: Diagnosis not present

## 2022-10-27 DIAGNOSIS — Z7901 Long term (current) use of anticoagulants: Secondary | ICD-10-CM | POA: Diagnosis not present

## 2022-10-27 DIAGNOSIS — Q828 Other specified congenital malformations of skin: Secondary | ICD-10-CM

## 2022-10-27 DIAGNOSIS — B351 Tinea unguium: Secondary | ICD-10-CM

## 2022-10-27 DIAGNOSIS — M79675 Pain in left toe(s): Secondary | ICD-10-CM

## 2022-10-27 NOTE — Progress Notes (Signed)
  Subjective:  Patient ID: [name] [name], male    DOB: [birth year],   MRN: [id number]  Chief Complaint  Patient presents with   Nail Problem    Pt present today for a rfc. Pt denies pain.    82 y.o. male presents for concern of thickened elongated and painful nails that are difficult to trim. Requesting to have them trimmed today. Cocnerned about left great toe crack as well as right pinky toenail.   Does relates some burning sensation in feet that come and go.  He is not diabetic but is currently on Eliquis.   PCP:  Eartha Inch, MD    . Denies any other pedal complaints. Denies n/v/f/c.   Past Medical History:  Diagnosis Date   Alzheimer disease (HCC)    Chronic kidney disease    mild insuffiency   Coronary artery disease    LHC 4/10: Mid LAD 40-50%, then 70%, OM1 20-30%, EF 65%. Mid LAD FFR 0.89 (not hemodynamically significant). Medical therapy was continued.   Dysuria 08/10/2020   Hyperlipidemia    LBBB (left bundle branch block) 06/2018   Obstructive sleep apnea     Objective:  Physical Exam: Vascular: DP/PT pulses 2/4 bilateral. CFT <3 seconds. Absent hair growth on digits. Edema noted to bilateral lower extremities. Xerosis noted bilaterally.  Skin. No lacerations or abrasions bilateral feet. Nails 1-5 bilateral  are thickened discolored and elongated with subungual debris.  Mild erythema noted over dorsum of fifth digit appear to be rubbing from shoes. Hyperkeratotic tissue noted bilateral plantar fifth metatarsals with cored areas noted.  Musculoskeletal: MMT 5/5 bilateral lower extremities in DF, PF, Inversion and Eversion. Deceased ROM in DF of ankle joint. Hammered digits 2-5 bilateral.  Neurological: Sensation intact to light touch. Protective sensation intact bilateral.    Assessment:   1. Pain due to onychomycosis of toenails of both feet   2. Chronic anticoagulation        Plan:  Patient was evaluated and treated and all questions  answered. -Discussed and educated patient on foot care, especially with  regards to the vascular, neurological and musculoskeletal systems.  -Discussed supportive shoes at all times and checking feet regularly.  -Mechanically debrided all nails 1-5 bilateral using sterile nail nipper and filed with dremel without incident  -Hyperkeratotic tissue x2 debrided bilateral fifth metatarsal heads without incident with chisel.  -Answered all patient questions -Patient to return  in 4 months for at risk foot care -Patient advised to call the office if any problems or questions arise in the meantime.   Louann Sjogren, DPM

## 2022-11-01 ENCOUNTER — Other Ambulatory Visit: Payer: Self-pay | Admitting: Medical Genetics

## 2022-11-01 DIAGNOSIS — Z006 Encounter for examination for normal comparison and control in clinical research program: Secondary | ICD-10-CM

## 2022-11-13 ENCOUNTER — Encounter (HOSPITAL_BASED_OUTPATIENT_CLINIC_OR_DEPARTMENT_OTHER): Payer: Self-pay

## 2022-11-13 ENCOUNTER — Other Ambulatory Visit: Payer: Self-pay

## 2022-11-13 ENCOUNTER — Inpatient Hospital Stay (HOSPITAL_BASED_OUTPATIENT_CLINIC_OR_DEPARTMENT_OTHER)
Admission: EM | Admit: 2022-11-13 | Discharge: 2022-11-22 | DRG: 602 | Disposition: A | Discharge status: Home Health | Payer: Medicare Other | Attending: Internal Medicine | Admitting: Internal Medicine

## 2022-11-13 DIAGNOSIS — L0390 Cellulitis, unspecified: Secondary | ICD-10-CM | POA: Diagnosis present

## 2022-11-13 DIAGNOSIS — M7989 Other specified soft tissue disorders: Secondary | ICD-10-CM | POA: Diagnosis not present

## 2022-11-13 DIAGNOSIS — Z79899 Other long term (current) drug therapy: Secondary | ICD-10-CM

## 2022-11-13 DIAGNOSIS — G309 Alzheimer's disease, unspecified: Secondary | ICD-10-CM | POA: Diagnosis present

## 2022-11-13 DIAGNOSIS — E669 Obesity, unspecified: Secondary | ICD-10-CM | POA: Diagnosis present

## 2022-11-13 DIAGNOSIS — I5033 Acute on chronic diastolic (congestive) heart failure: Secondary | ICD-10-CM | POA: Diagnosis present

## 2022-11-13 DIAGNOSIS — G4733 Obstructive sleep apnea (adult) (pediatric): Secondary | ICD-10-CM | POA: Diagnosis present

## 2022-11-13 DIAGNOSIS — I447 Left bundle-branch block, unspecified: Secondary | ICD-10-CM | POA: Diagnosis present

## 2022-11-13 DIAGNOSIS — I130 Hypertensive heart and chronic kidney disease with heart failure and stage 1 through stage 4 chronic kidney disease, or unspecified chronic kidney disease: Secondary | ICD-10-CM | POA: Diagnosis present

## 2022-11-13 DIAGNOSIS — I739 Peripheral vascular disease, unspecified: Secondary | ICD-10-CM | POA: Diagnosis present

## 2022-11-13 DIAGNOSIS — E785 Hyperlipidemia, unspecified: Secondary | ICD-10-CM | POA: Diagnosis present

## 2022-11-13 DIAGNOSIS — Z7901 Long term (current) use of anticoagulants: Secondary | ICD-10-CM

## 2022-11-13 DIAGNOSIS — Z96642 Presence of left artificial hip joint: Secondary | ICD-10-CM | POA: Diagnosis present

## 2022-11-13 DIAGNOSIS — L03115 Cellulitis of right lower limb: Secondary | ICD-10-CM | POA: Diagnosis not present

## 2022-11-13 DIAGNOSIS — Z882 Allergy status to sulfonamides status: Secondary | ICD-10-CM

## 2022-11-13 DIAGNOSIS — I878 Other specified disorders of veins: Secondary | ICD-10-CM | POA: Diagnosis present

## 2022-11-13 DIAGNOSIS — R6510 Systemic inflammatory response syndrome (SIRS) of non-infectious origin without acute organ dysfunction: Secondary | ICD-10-CM | POA: Diagnosis present

## 2022-11-13 DIAGNOSIS — I872 Venous insufficiency (chronic) (peripheral): Secondary | ICD-10-CM | POA: Diagnosis present

## 2022-11-13 DIAGNOSIS — M25511 Pain in right shoulder: Secondary | ICD-10-CM | POA: Diagnosis present

## 2022-11-13 DIAGNOSIS — Z87891 Personal history of nicotine dependence: Secondary | ICD-10-CM

## 2022-11-13 DIAGNOSIS — I2510 Atherosclerotic heart disease of native coronary artery without angina pectoris: Secondary | ICD-10-CM | POA: Diagnosis present

## 2022-11-13 DIAGNOSIS — Z6830 Body mass index (BMI) 30.0-30.9, adult: Secondary | ICD-10-CM

## 2022-11-13 DIAGNOSIS — R000 Tachycardia, unspecified: Secondary | ICD-10-CM | POA: Diagnosis present

## 2022-11-13 DIAGNOSIS — M19011 Primary osteoarthritis, right shoulder: Secondary | ICD-10-CM | POA: Diagnosis present

## 2022-11-13 DIAGNOSIS — G9341 Metabolic encephalopathy: Secondary | ICD-10-CM | POA: Diagnosis present

## 2022-11-13 DIAGNOSIS — Z881 Allergy status to other antibiotic agents status: Secondary | ICD-10-CM

## 2022-11-13 DIAGNOSIS — I480 Paroxysmal atrial fibrillation: Secondary | ICD-10-CM | POA: Diagnosis present

## 2022-11-13 DIAGNOSIS — Z8249 Family history of ischemic heart disease and other diseases of the circulatory system: Secondary | ICD-10-CM

## 2022-11-13 DIAGNOSIS — Z66 Do not resuscitate: Secondary | ICD-10-CM | POA: Diagnosis present

## 2022-11-13 DIAGNOSIS — Z888 Allergy status to other drugs, medicaments and biological substances status: Secondary | ICD-10-CM

## 2022-11-13 DIAGNOSIS — F0280 Dementia in other diseases classified elsewhere without behavioral disturbance: Secondary | ICD-10-CM | POA: Diagnosis present

## 2022-11-13 DIAGNOSIS — I959 Hypotension, unspecified: Secondary | ICD-10-CM | POA: Diagnosis not present

## 2022-11-13 DIAGNOSIS — R011 Cardiac murmur, unspecified: Secondary | ICD-10-CM | POA: Diagnosis present

## 2022-11-13 DIAGNOSIS — R600 Localized edema: Secondary | ICD-10-CM

## 2022-11-13 DIAGNOSIS — N1831 Chronic kidney disease, stage 3a: Secondary | ICD-10-CM | POA: Diagnosis present

## 2022-11-13 LAB — COMPREHENSIVE METABOLIC PANEL
ALT: 26 U/L (ref 0–44)
AST: 23 U/L (ref 15–41)
Albumin: 3.9 g/dL (ref 3.5–5.0)
Alkaline Phosphatase: 61 U/L (ref 38–126)
Anion gap: 7 (ref 5–15)
BUN: 22 mg/dL (ref 8–23)
CO2: 28 mmol/L (ref 22–32)
Calcium: 9.7 mg/dL (ref 8.9–10.3)
Chloride: 104 mmol/L (ref 98–111)
Creatinine, Ser: 1.19 mg/dL (ref 0.61–1.24)
GFR, Estimated: >60 mL/min (ref >60)
Glucose, Bld: 164 mg/dL — ABNORMAL HIGH (ref 70–99)
Potassium: 3.8 mmol/L (ref 3.5–5.1)
Sodium: 139 mmol/L (ref 135–145)
Total Bilirubin: 0.5 mg/dL (ref 0.3–1.2)
Total Protein: 6.6 g/dL (ref 6.5–8.1)

## 2022-11-13 LAB — CBC WITH DIFFERENTIAL/PLATELET
Abs Immature Granulocytes: 0.02 10*3/uL (ref 0.00–0.07)
Basophils Absolute: 0.1 10*3/uL (ref 0.0–0.1)
Basophils Relative: 1 %
Eosinophils Absolute: 0.2 10*3/uL (ref 0.0–0.5)
Eosinophils Relative: 3 %
HCT: 39.7 % (ref 39.0–52.0)
Hemoglobin: 13.5 g/dL (ref 13.0–17.0)
Immature Granulocytes: 0 %
Lymphocytes Relative: 20 %
Lymphs Abs: 1.1 10*3/uL (ref 0.7–4.0)
MCH: 32.1 pg (ref 26.0–34.0)
MCHC: 34 g/dL (ref 30.0–36.0)
MCV: 94.5 fL (ref 80.0–100.0)
Monocytes Absolute: 0.6 10*3/uL (ref 0.1–1.0)
Monocytes Relative: 11 %
Neutro Abs: 3.5 10*3/uL (ref 1.7–7.7)
Neutrophils Relative %: 65 %
Platelets: 248 10*3/uL (ref 150–400)
RBC: 4.2 MIL/uL — ABNORMAL LOW (ref 4.22–5.81)
RDW: 13 % (ref 11.5–15.5)
WBC: 5.4 10*3/uL (ref 4.0–10.5)
nRBC: 0 % (ref 0.0–0.2)

## 2022-11-13 LAB — LACTIC ACID, PLASMA: Lactic Acid, Venous: 1.1 mmol/L (ref 0.5–1.9)

## 2022-11-13 MED ORDER — CLINDAMYCIN PHOSPHATE 600 MG/50ML IV SOLN
600.0000 mg | Freq: Once | INTRAVENOUS | Status: AC
  Administered 2022-11-13: 600 mg via INTRAVENOUS
  Filled 2022-11-13: qty 50

## 2022-11-13 NOTE — ED Triage Notes (Addendum)
Pt has rt. Leg swelling and cellulitis of left leg.   Is on doxycycline daily for other issues and just finished clindamycin 1 week ago Does c/o pain Pt is on blood thinners

## 2022-11-13 NOTE — ED Provider Notes (Signed)
[place] EMERGENCY DEPARTMENT AT [facility] Provider Note   CSN: [id number] Arrival date & time: 11/13/22  2133     History {Add pertinent medical, surgical, social history, OB history to HPI:1} Chief Complaint  Patient presents with   cellulitis    [name] [name] is a 82 y.o. male.  He is brought in by his daughter for increased swelling and pain of his right lower leg.  He has some chronic stasis and has had cellulitis before.  Doctor put him on clindamycin but he is not getting any better.  He supposed to be using compression stockings but leg was too swollen.  He is on a blood thinner for A-fib.  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  No fevers nausea vomiting.  No known trauma.  The history is provided by the patient and a relative.  Leg Pain Location:  Leg Time since incident:  1 week Injury: no   Leg location:  R lower leg Pain details:    Quality:  Aching   Severity:  Moderate   Onset quality:  Gradual   Timing:  Constant   Progression:  Waxing and waning Chronicity:  Recurrent Relieved by:  Nothing Worsened by:  Bearing weight Ineffective treatments: abx. Associated symptoms: swelling   Associated symptoms: no fever        Home Medications Prior to Admission medications   Medication Sig Start Date End Date Taking? Authorizing Provider  cholecalciferol (VITAMIN D3) 25 MCG (1000 UNIT) tablet Take 1,000 Units by mouth in the morning and at bedtime.    [provider]  clindamycin (CLEOCIN) 300 MG capsule Take 1 capsule (300 mg total) by mouth 3 (three) times daily. Patient not taking: Reported on 08/22/2022 03/20/22   Benjiman Core, MD  donepezil (ARICEPT) 10 MG tablet Take 10 mg by mouth at bedtime. 12/01/16   [provider]  doxycycline (VIBRAMYCIN) 100 MG capsule Take 100 mg by mouth daily. 08/13/22   [provider]  ELIQUIS 5 MG TABS tablet  TAKE 1 TABLET(5 MG) BY MOUTH TWICE DAILY 10/17/22   [place], Peter M, MD  fesoterodine (TOVIAZ) 8 MG TB24 tablet Take 8 mg by mouth at bedtime. 12/13/18   [provider]  fluticasone (FLONASE) 50 MCG/ACT nasal spray Place 2 sprays into both nostrils daily as needed for allergies. 05/15/17   [provider]  furosemide (LASIX) 20 MG tablet Take 1 tablet (20 mg total) by mouth daily. 09/07/20   Marjie Skiff E, PA-C  ketoconazole (NIZORAL) 2 % cream Apply 1 application topically daily. 02/27/21   [provider]  lisinopril (ZESTRIL) 20 MG tablet Take 20 mg by mouth at bedtime. 08/23/18   [provider]  mirabegron ER (MYRBETRIQ) 50 MG TB24 tablet Take 50 mg by mouth every morning.    [provider]  Misc Natural Products (OSTEO BI-FLEX/5-LOXIN ADVANCED) TABS Take 1 tablet by mouth 2 (two) times daily.    [provider]  Multiple Vitamin (MULTIVITAMIN WITH MINERALS) TABS tablet Take 1 tablet by mouth every morning.    [provider]  nitrofurantoin, macrocrystal-monohydrate, (MACROBID) 100 MG capsule Take 1 capsule (100 mg total) by mouth in the morning. 01/23/21   Almon Hercules, MD  Omega-3 Fatty Acids (FISH OIL) 1200 MG CAPS Take 1,200 mg by mouth 2 (two) times daily.    [provider]  PRESCRIPTION MEDICATION Inhale into the lungs at bedtime.  CPAP    [provider]  rosuvastatin (CRESTOR) 10 MG tablet Take 0.5 tablets (5 mg total) by mouth every other day. 06/28/21   [place], Peter M, MD      Allergies    Enablex [darifenacin hydrobromide er], Atorvastatin, Ceftriaxone, Ezetimibe, Levaquin [levofloxacin], Metoprolol, Oxybutynin, Sulfamethoxazole, Sulfasalazine, and Sulfonamide derivatives    Review of Systems   Review of Systems  Constitutional:  Negative for fever.  Respiratory:  Negative for shortness of breath.   Cardiovascular:  Negative for chest pain.  Gastrointestinal:  Negative for abdominal pain.   Skin:  Positive for rash.    Physical Exam Updated Vital Signs BP (!) 161/72 (BP Location: Right Arm)   Pulse 67   Temp 97.7 F (36.5 C)   Resp 20   Ht 5\' 10"  (1.778 m)   Wt 95.3 kg   SpO2 98%   BMI 30.13 kg/m  Physical Exam Vitals and nursing note reviewed.  Constitutional:      General: He is not in acute distress.    Appearance: Normal appearance. He is well-developed.  HENT:     Head: Normocephalic and atraumatic.  Eyes:     Conjunctiva/sclera: Conjunctivae normal.  Cardiovascular:     Rate and Rhythm: Normal rate and regular rhythm.     Heart sounds: No murmur heard. Pulmonary:     Effort: Pulmonary effort is normal. No respiratory distress.     Breath sounds: Normal breath sounds.  Abdominal:     Palpations: Abdomen is soft.     Tenderness: There is no abdominal tenderness. There is no guarding or rebound.  Musculoskeletal:        General: No swelling.     Cervical back: Neck supple.     Right lower leg: Edema present.     Left lower leg: Edema present.     Comments: He has edema right greater than left lower extremity.  There is some warmth and some color change to it.  There is no definite open wound.  There is no crepitus in the skin.  Appears to involve his foot and lower leg up to just below his knee.  Skin:    General: Skin is warm and dry.     Capillary Refill: Capillary refill takes less than 2 seconds.  Neurological:     General: No focal deficit present.     Mental Status: He is alert.     ED Results / Procedures / Treatments   Labs (all labs ordered are listed, but only abnormal results are displayed) Labs Reviewed  CBC WITH DIFFERENTIAL/PLATELET - Abnormal; Notable for the following components:      Result Value   RBC 4.20 (*)    All other components within normal limits  COMPREHENSIVE METABOLIC PANEL - Abnormal; Notable for the following components:   Glucose, Bld 164 (*)    All other components within normal limits  CULTURE, BLOOD  (ROUTINE X 2)  CULTURE, BLOOD (ROUTINE X 2)  LACTIC ACID, PLASMA  LACTIC ACID, PLASMA    EKG None  Radiology No results found.  Procedures Procedures  {Document cardiac monitor, telemetry assessment procedure when appropriate:1}  Medications Ordered in ED Medications  clindamycin (CLEOCIN) IVPB 600 mg (has no administration in time range)    ED Course/ Medical Decision Making/ A&P   {   Click here for ABCD2, HEART and other calculatorsREFRESH Note before signing :1}  Medical Decision Making Amount and/or Complexity of Data Reviewed Labs: ordered.  Risk Prescription drug management.   This patient complains of ***; this involves an extensive number of treatment Options and is a complaint that carries with it a high risk of complications and morbidity. The differential includes ***  I ordered, reviewed and interpreted labs, which included *** I ordered medication *** and reviewed PMP when indicated. I ordered imaging studies which included *** and I independently    visualized and interpreted imaging which showed *** Additional history obtained from *** Previous records obtained and reviewed *** I consulted *** and discussed lab and imaging findings and discussed disposition.  Cardiac monitoring reviewed, *** Social determinants considered, *** Critical Interventions: ***  After the interventions stated above, I reevaluated the patient and found *** Admission and further testing considered, ***   {Document critical care time when appropriate:1} {Document review of labs and clinical decision tools ie heart score, Chads2Vasc2 etc:1}  {Document your independent review of radiology images, and any outside records:1} {Document your discussion with family members, caretakers, and with consultants:1} {Document social determinants of health affecting pt's care:1} {Document your decision making why or why not admission, treatments were  needed:1} Final Clinical Impression(s) / ED Diagnoses Final diagnoses:  None    Rx / DC Orders ED Discharge Orders     None

## 2022-11-14 ENCOUNTER — Observation Stay (HOSPITAL_COMMUNITY): Payer: Medicare Other

## 2022-11-14 ENCOUNTER — Inpatient Hospital Stay (HOSPITAL_COMMUNITY): Payer: Medicare Other

## 2022-11-14 DIAGNOSIS — R609 Edema, unspecified: Secondary | ICD-10-CM | POA: Diagnosis not present

## 2022-11-14 DIAGNOSIS — I951 Orthostatic hypotension: Secondary | ICD-10-CM | POA: Diagnosis not present

## 2022-11-14 DIAGNOSIS — I2510 Atherosclerotic heart disease of native coronary artery without angina pectoris: Secondary | ICD-10-CM | POA: Diagnosis present

## 2022-11-14 DIAGNOSIS — I130 Hypertensive heart and chronic kidney disease with heart failure and stage 1 through stage 4 chronic kidney disease, or unspecified chronic kidney disease: Secondary | ICD-10-CM | POA: Diagnosis present

## 2022-11-14 DIAGNOSIS — Z87891 Personal history of nicotine dependence: Secondary | ICD-10-CM | POA: Diagnosis not present

## 2022-11-14 DIAGNOSIS — E669 Obesity, unspecified: Secondary | ICD-10-CM | POA: Diagnosis present

## 2022-11-14 DIAGNOSIS — R6510 Systemic inflammatory response syndrome (SIRS) of non-infectious origin without acute organ dysfunction: Secondary | ICD-10-CM | POA: Diagnosis present

## 2022-11-14 DIAGNOSIS — E785 Hyperlipidemia, unspecified: Secondary | ICD-10-CM | POA: Diagnosis present

## 2022-11-14 DIAGNOSIS — L03116 Cellulitis of left lower limb: Secondary | ICD-10-CM | POA: Diagnosis not present

## 2022-11-14 DIAGNOSIS — Z6830 Body mass index (BMI) 30.0-30.9, adult: Secondary | ICD-10-CM | POA: Diagnosis not present

## 2022-11-14 DIAGNOSIS — I739 Peripheral vascular disease, unspecified: Secondary | ICD-10-CM | POA: Diagnosis present

## 2022-11-14 DIAGNOSIS — I872 Venous insufficiency (chronic) (peripheral): Secondary | ICD-10-CM | POA: Diagnosis present

## 2022-11-14 DIAGNOSIS — L03115 Cellulitis of right lower limb: Secondary | ICD-10-CM | POA: Diagnosis present

## 2022-11-14 DIAGNOSIS — M19011 Primary osteoarthritis, right shoulder: Secondary | ICD-10-CM | POA: Diagnosis present

## 2022-11-14 DIAGNOSIS — I480 Paroxysmal atrial fibrillation: Secondary | ICD-10-CM | POA: Diagnosis present

## 2022-11-14 DIAGNOSIS — I509 Heart failure, unspecified: Secondary | ICD-10-CM

## 2022-11-14 DIAGNOSIS — R000 Tachycardia, unspecified: Secondary | ICD-10-CM | POA: Diagnosis present

## 2022-11-14 DIAGNOSIS — M25511 Pain in right shoulder: Secondary | ICD-10-CM | POA: Diagnosis present

## 2022-11-14 DIAGNOSIS — N1831 Chronic kidney disease, stage 3a: Secondary | ICD-10-CM | POA: Diagnosis present

## 2022-11-14 DIAGNOSIS — M7989 Other specified soft tissue disorders: Secondary | ICD-10-CM | POA: Diagnosis present

## 2022-11-14 DIAGNOSIS — I5033 Acute on chronic diastolic (congestive) heart failure: Secondary | ICD-10-CM | POA: Diagnosis present

## 2022-11-14 DIAGNOSIS — Z7189 Other specified counseling: Secondary | ICD-10-CM | POA: Diagnosis not present

## 2022-11-14 DIAGNOSIS — Z96642 Presence of left artificial hip joint: Secondary | ICD-10-CM | POA: Diagnosis present

## 2022-11-14 DIAGNOSIS — R600 Localized edema: Secondary | ICD-10-CM | POA: Diagnosis not present

## 2022-11-14 DIAGNOSIS — F0280 Dementia in other diseases classified elsewhere without behavioral disturbance: Secondary | ICD-10-CM | POA: Diagnosis present

## 2022-11-14 DIAGNOSIS — I482 Chronic atrial fibrillation, unspecified: Secondary | ICD-10-CM | POA: Diagnosis not present

## 2022-11-14 DIAGNOSIS — G9341 Metabolic encephalopathy: Secondary | ICD-10-CM | POA: Diagnosis present

## 2022-11-14 DIAGNOSIS — Z66 Do not resuscitate: Secondary | ICD-10-CM | POA: Diagnosis present

## 2022-11-14 DIAGNOSIS — G309 Alzheimer's disease, unspecified: Secondary | ICD-10-CM | POA: Diagnosis present

## 2022-11-14 DIAGNOSIS — G4733 Obstructive sleep apnea (adult) (pediatric): Secondary | ICD-10-CM | POA: Diagnosis present

## 2022-11-14 DIAGNOSIS — I959 Hypotension, unspecified: Secondary | ICD-10-CM | POA: Diagnosis not present

## 2022-11-14 DIAGNOSIS — Z79899 Other long term (current) drug therapy: Secondary | ICD-10-CM | POA: Diagnosis not present

## 2022-11-14 DIAGNOSIS — Z515 Encounter for palliative care: Secondary | ICD-10-CM | POA: Diagnosis not present

## 2022-11-14 DIAGNOSIS — R4182 Altered mental status, unspecified: Secondary | ICD-10-CM | POA: Diagnosis not present

## 2022-11-14 LAB — BASIC METABOLIC PANEL
Anion gap: 13 (ref 5–15)
BUN: 19 mg/dL (ref 8–23)
CO2: 19 mmol/L — ABNORMAL LOW (ref 22–32)
Calcium: 9.4 mg/dL (ref 8.9–10.3)
Chloride: 105 mmol/L (ref 98–111)
Creatinine, Ser: 1.14 mg/dL (ref 0.61–1.24)
GFR, Estimated: >60 mL/min (ref >60)
Glucose, Bld: 110 mg/dL — ABNORMAL HIGH (ref 70–99)
Potassium: 4.1 mmol/L (ref 3.5–5.1)
Sodium: 137 mmol/L (ref 135–145)

## 2022-11-14 LAB — ECHOCARDIOGRAM COMPLETE
AV Mean grad: 8 mmHg
AV Peak grad: 14.9 mmHg
Ao pk vel: 1.93 m/s
Area-P 1/2: 3.02 cm2
Height: 70 in
S' Lateral: 3.77 cm
Weight: 3350.99 [oz_av]

## 2022-11-14 LAB — CBC
HCT: 46 % (ref 39.0–52.0)
Hemoglobin: 14.6 g/dL (ref 13.0–17.0)
MCH: 31.1 pg (ref 26.0–34.0)
MCHC: 31.7 g/dL (ref 30.0–36.0)
MCV: 98.1 fL (ref 80.0–100.0)
Platelets: 238 10*3/uL (ref 150–400)
RBC: 4.69 MIL/uL (ref 4.22–5.81)
RDW: 13 % (ref 11.5–15.5)
WBC: 5.5 10*3/uL (ref 4.0–10.5)
nRBC: 0 % (ref 0.0–0.2)

## 2022-11-14 LAB — PHOSPHORUS: Phosphorus: 3.5 mg/dL (ref 2.5–4.6)

## 2022-11-14 LAB — MAGNESIUM: Magnesium: 2.2 mg/dL (ref 1.7–2.4)

## 2022-11-14 LAB — BRAIN NATRIURETIC PEPTIDE: B Natriuretic Peptide: 169.5 pg/mL — ABNORMAL HIGH (ref 0.0–100.0)

## 2022-11-14 LAB — GLUCOSE, CAPILLARY: Glucose-Capillary: 170 mg/dL — ABNORMAL HIGH (ref 70–99)

## 2022-11-14 LAB — LACTIC ACID, PLASMA: Lactic Acid, Venous: 1.2 mmol/L (ref 0.5–1.9)

## 2022-11-14 MED ORDER — CEFAZOLIN SODIUM-DEXTROSE 2-4 GM/100ML-% IV SOLN
2.0000 g | Freq: Three times a day (TID) | INTRAVENOUS | Status: AC
  Administered 2022-11-14 – 2022-11-18 (×14): 2 g via INTRAVENOUS
  Filled 2022-11-14 (×14): qty 100

## 2022-11-14 MED ORDER — VANCOMYCIN HCL IN DEXTROSE 1-5 GM/200ML-% IV SOLN
1000.0000 mg | Freq: Once | INTRAVENOUS | Status: AC
  Administered 2022-11-14: 1000 mg via INTRAVENOUS
  Filled 2022-11-14: qty 200

## 2022-11-14 MED ORDER — NITROFURANTOIN MONOHYD MACRO 100 MG PO CAPS
100.0000 mg | ORAL_CAPSULE | Freq: Every morning | ORAL | Status: DC
  Administered 2022-11-15 – 2022-11-22 (×8): 100 mg via ORAL
  Filled 2022-11-14 (×9): qty 1

## 2022-11-14 MED ORDER — VANCOMYCIN HCL IN DEXTROSE 1-5 GM/200ML-% IV SOLN: 1000.0000 mg | Freq: Once | INTRAVENOUS | Status: DC

## 2022-11-14 MED ORDER — FUROSEMIDE 10 MG/ML IJ SOLN
20.0000 mg | Freq: Once | INTRAMUSCULAR | Status: AC
  Administered 2022-11-14: 20 mg via INTRAVENOUS
  Filled 2022-11-14: qty 2

## 2022-11-14 MED ORDER — FUROSEMIDE 10 MG/ML IJ SOLN
40.0000 mg | Freq: Every day | INTRAMUSCULAR | Status: DC
  Administered 2022-11-14 – 2022-11-17 (×4): 40 mg via INTRAVENOUS
  Filled 2022-11-14 (×4): qty 4

## 2022-11-14 MED ORDER — DONEPEZIL HCL 10 MG PO TABS
10.0000 mg | ORAL_TABLET | Freq: Every day | ORAL | Status: DC
  Administered 2022-11-14 – 2022-11-21 (×8): 10 mg via ORAL
  Filled 2022-11-14 (×8): qty 1

## 2022-11-14 MED ORDER — MIRABEGRON ER 50 MG PO TB24
50.0000 mg | ORAL_TABLET | Freq: Every morning | ORAL | Status: DC
  Administered 2022-11-15 – 2022-11-22 (×8): 50 mg via ORAL
  Filled 2022-11-14 (×8): qty 1

## 2022-11-14 MED ORDER — DOXYCYCLINE HYCLATE 100 MG PO TABS
100.0000 mg | ORAL_TABLET | ORAL | Status: DC
  Administered 2022-11-14: 100 mg via ORAL
  Filled 2022-11-14: qty 1

## 2022-11-14 MED ORDER — ROSUVASTATIN CALCIUM 5 MG PO TABS
5.0000 mg | ORAL_TABLET | ORAL | Status: DC
  Administered 2022-11-14 – 2022-11-22 (×5): 5 mg via ORAL
  Filled 2022-11-14 (×6): qty 1

## 2022-11-14 MED ORDER — FESOTERODINE FUMARATE ER 8 MG PO TB24
8.0000 mg | ORAL_TABLET | Freq: Every day | ORAL | Status: DC
  Administered 2022-11-14 – 2022-11-22 (×9): 8 mg via ORAL
  Filled 2022-11-14 (×10): qty 1

## 2022-11-14 MED ORDER — APIXABAN 5 MG PO TABS
5.0000 mg | ORAL_TABLET | Freq: Two times a day (BID) | ORAL | Status: DC
  Administered 2022-11-14 – 2022-11-22 (×17): 5 mg via ORAL
  Filled 2022-11-14 (×6): qty 1
  Filled 2022-11-14: qty 2
  Filled 2022-11-14 (×10): qty 1

## 2022-11-14 MED ORDER — PERFLUTREN LIPID MICROSPHERE
1.0000 mL | INTRAVENOUS | Status: AC | PRN
  Administered 2022-11-14: 4 mL via INTRAVENOUS

## 2022-11-14 NOTE — Progress Notes (Signed)
MD at bedside. 

## 2022-11-14 NOTE — Progress Notes (Signed)
ED Pharmacy Antibiotic Sign Off An antibiotic consult was received from an ED provider for vancomycin per pharmacy dosing for cellulitis. A chart review was completed to assess appropriateness.   The following one time order(s) were placed:   -Vancomycin 2g IV x1  Further antibiotic and/or antibiotic pharmacy consults should be ordered by the admitting provider if indicated.   Thank you for allowing pharmacy to be a part of this patient's care.   Arabella Merles, [facility]  Clinical Pharmacist 11/14/22 1:13 AM

## 2022-11-14 NOTE — ED Notes (Signed)
Notified pt's daughter of his bed assignment and room number

## 2022-11-14 NOTE — Progress Notes (Signed)
Lower extremity venous duplex completed. Please see CV Procedures for preliminary results.  Shona Simpson, RVT 11/14/22 4:49 PM

## 2022-11-14 NOTE — H&P (Addendum)
History and Physical    [name] [name] WCB:[id number] DOB: June 14, 1940 DOA: 11/13/2022  PCP: [name], NP   Patient coming from:  OSH ED - [facility]   Chief Complaint:  Chief Complaint  Patient presents with   cellulitis    HPI:  [name] [name] is a 82 y.o. male with hx of Alzheimer's dementia, A-fib on anticoagulation, venous insufficiency lower extremities, diastolic heart failure, CAD, CKD stage III, hypertension, hyperlipidemia, OSA, who presented to [facility] ED with worsening swelling in the right lower extremity.  Initially accompanied by daughter at outside hospital.  Here he provided the history, which is limited because of his dementia.  He reported 2 to 3-week history of bilateral lower extremity swelling, right greater than left.  With associated skin changes.  He had been seen by PCP, does not know what date, and placed on clindamycin without improvement in his symptoms.  Thus sought care.  Denies any associated fevers.  Reports compliance with home Lasix and apixaban.  Reportedly on chronic doxycycline and Macrobid  Review of Systems:  ROS complete and negative except as marked above   Allergies  Allergen Reactions   Enablex [Darifenacin Hydrobromide Er]     PT STATES IT MAKES HIM "BONKERS" AND VERY TIRED   Atorvastatin Other (See Comments)    Muscle aches/weakness  Other reaction(s): Other (See Comments)  Muscle aches/weakness  Muscle aches/weakness  Muscle aches/weakness  Muscle aches/weakness  Muscle aches/weakness  Muscle aches/weakness   Ceftriaxone Other (See Comments)    Unknown reaction  Other reaction(s): Other (See Comments)  Unknown reaction  Unknown reaction    Unknown reaction  Unknown reaction  Unknown reaction   Ezetimibe Other (See Comments)    Muscle aches/weakness  Other reaction(s): Other (See Comments)  Muscle aches/weakness  Muscle aches/weakness  Muscle aches/weakness   Levaquin [Levofloxacin] Diarrhea    Metoprolol Rash   Oxybutynin Other (See Comments)    Memory impairment, temperament changes   Sulfamethoxazole Other (See Comments)    Childhood allergy   Sulfasalazine Other (See Comments)    Childhood allergy   Sulfonamide Derivatives Other (See Comments)    Childhood allergy    Prior to Admission medications   Medication Sig Start Date End Date Taking? Authorizing Provider  cholecalciferol (VITAMIN D3) 25 MCG (1000 UNIT) tablet Take 1,000 Units by mouth in the morning and at bedtime.    [provider]  clindamycin (CLEOCIN) 300 MG capsule Take 1 capsule (300 mg total) by mouth 3 (three) times daily. Patient not taking: Reported on 08/22/2022 03/20/22   Benjiman Core, MD  donepezil (ARICEPT) 10 MG tablet Take 10 mg by mouth at bedtime. 12/01/16   [provider]  doxycycline (VIBRAMYCIN) 100 MG capsule Take 100 mg by mouth daily. 08/13/22   [provider]  ELIQUIS 5 MG TABS tablet TAKE 1 TABLET(5 MG) BY MOUTH TWICE DAILY 10/17/22   [place], Peter M, MD  fesoterodine (TOVIAZ) 8 MG TB24 tablet Take 8 mg by mouth at bedtime. 12/13/18   [provider]  fluticasone (FLONASE) 50 MCG/ACT nasal spray Place 2 sprays into both nostrils daily as needed for allergies. 05/15/17   [provider]  furosemide (LASIX) 20 MG tablet Take 1 tablet (20 mg total) by mouth daily. 09/07/20   Marjie Skiff E, PA-C  ketoconazole (NIZORAL) 2 % cream Apply 1 application topically daily. 02/27/21   [provider]  lisinopril (ZESTRIL) 20 MG tablet Take 20 mg by mouth at bedtime. 08/23/18  [provider]  mirabegron ER (MYRBETRIQ) 50 MG TB24 tablet Take 50 mg by mouth every morning.    [provider]  Misc Natural Products (OSTEO BI-FLEX/5-LOXIN ADVANCED) TABS Take 1 tablet by mouth 2 (two) times daily.    [provider]  Multiple Vitamin (MULTIVITAMIN WITH MINERALS) TABS tablet Take 1 tablet by mouth every morning.    [provider]  nitrofurantoin, macrocrystal-monohydrate, (MACROBID) 100 MG capsule Take 1 capsule (100 mg total) by mouth in the morning. 01/23/21   Almon Hercules, MD  Omega-3 Fatty Acids (FISH OIL) 1200 MG CAPS Take 1,200 mg by mouth 2 (two) times daily.    [provider]  PRESCRIPTION MEDICATION Inhale into the lungs at bedtime. CPAP    [provider]  rosuvastatin (CRESTOR) 10 MG tablet Take 0.5 tablets (5 mg total) by mouth every other day. 06/28/21   [place], Peter M, MD    Past Medical History:  Diagnosis Date   Alzheimer disease [facility])    Chronic kidney disease    mild insuffiency   Coronary artery disease    LHC 4/10: Mid LAD 40-50%, then 70%, OM1 20-30%, EF 65%. Mid LAD FFR 0.89 (not hemodynamically significant). Medical therapy was continued.   Dysuria 08/10/2020   Hyperlipidemia    LBBB (left bundle branch block) 06/2018   Obstructive sleep apnea     Past Surgical History:  Procedure Laterality Date   CARDIAC CATHETERIZATION  05/02/2003   single vessel,moderate stenosis mid LAD   CARDIAC CATHETERIZATION  06/12/2008   continue med. therapy   right shoulder surgery     TOTAL HIP ARTHROPLASTY Left 09/14/2018   Procedure: LEFT TOTAL HIP ARTHROPLASTY ANTERIOR APPROACH;  Surgeon: Kathryne Hitch, MD;  Location: [facility] ORS;  Service: Orthopedics;  Laterality: Left;     reports that he quit smoking about 32 years ago. His smoking use included pipe. He has never used smokeless tobacco. He reports that he does not drink alcohol and does not use drugs.  Family History  Problem Relation Age of Onset   Cancer Mother    Hypertension Mother    Other Father        old age   Hypertension Father      Physical Exam: Vitals:   11/13/22 2148 11/13/22 2148 11/14/22 0226 11/14/22 0424  BP:  (!) 161/72 (!) 158/62   Pulse:  67 (!) 57   Resp:  20 16   Temp:  97.7 F (36.5 C) 98.3 F (36.8 C)   TempSrc:   Oral   SpO2:  98% 100%   Weight: 95.3 kg   95 kg   Height: 5\' 10"  (1.778 m)       Gen: Awake, alert, NAD,   CV: Regular, very soft S1, S2, faint 1/6 SEM at RUSB Resp: Normal WOB, CTAB  Abd: Flat, normoactive, nontender MSK: Asymmetric edema with right lower extremity greater than left lower extremity.  On the right 3+ edema to the knee.  On the left 1-2+ to the knee.  There are skin changes of venous stasis bilaterally.  In addition on the right side erythema extending from the ankle up towards the knee, increased warmth compared to contralateral. Skin: See MSK above for findings Neuro: Alert and interactive, orientation not assessed Psych: euthymic, appropriate    Data review:   Labs reviewed, notable for:   Lactate within normal limits,  chemistries and blood counts unremarkable. WBC within normal limits  Micro:  Results for orders placed  or performed during the hospital encounter of 12/23/21  Resp Panel by RT-PCR (Flu A&B, Covid) Anterior Nasal Swab     Status: None   Collection Time: 12/23/21  9:38 PM   Specimen: Anterior Nasal Swab  Result Value Ref Range Status   SARS Coronavirus 2 by RT PCR NEGATIVE NEGATIVE Final    Comment: (NOTE) SARS-CoV-2 target nucleic acids are NOT DETECTED.  The SARS-CoV-2 RNA is generally detectable in upper respiratory specimens during the acute phase of infection. The lowest concentration of SARS-CoV-2 viral copies this assay can detect is 138 copies/mL. A negative result does not preclude SARS-Cov-2 infection and should not be used as the sole basis for treatment or other patient management decisions. A negative result may occur with  improper specimen collection/handling, submission of specimen other than nasopharyngeal swab, presence of viral mutation(s) within the areas targeted by this assay, and inadequate number of viral copies(<138 copies/mL). A negative result must be combined with clinical observations, patient history, and epidemiological information. The expected result is  Negative.  Fact Sheet for Patients:  BloggerCourse.com  Fact Sheet for Healthcare Providers:  SeriousBroker.it  This test is no t yet approved or cleared by the [place] FDA and  has been authorized for detection and/or diagnosis of SARS-CoV-2 by FDA under an Emergency Use Authorization (EUA). This EUA will remain  in effect (meaning this test can be used) for the duration of the COVID-19 declaration under Section 564(b)(1) of the Act, 21 U.S.C.section 360bbb-3(b)(1), unless the authorization is terminated  or revoked sooner.       Influenza A by PCR NEGATIVE NEGATIVE Final   Influenza B by PCR NEGATIVE NEGATIVE Final    Comment: (NOTE) The Xpert Xpress SARS-CoV-2/FLU/RSV plus assay is intended as an aid in the diagnosis of influenza from Nasopharyngeal swab specimens and should not be used as a sole basis for treatment. Nasal washings and aspirates are unacceptable for Xpert Xpress SARS-CoV-2/FLU/RSV testing.  Fact Sheet for Patients: BloggerCourse.com  Fact Sheet for Healthcare Providers: SeriousBroker.it  This test is not yet approved or cleared by the [place] FDA and has been authorized for detection and/or diagnosis of SARS-CoV-2 by FDA under an Emergency Use Authorization (EUA). This EUA will remain in effect (meaning this test can be used) for the duration of the COVID-19 declaration under Section 564(b)(1) of the Act, 21 U.S.C. section 360bbb-3(b)(1), unless the authorization is terminated or revoked.  Performed at [facility], [address] [place], [place] [address]     Imaging reviewed:  No results found.   OSH ED Course:  Treated with vancomycin, clindamycin.    Assessment/Plan:  82 y.o. male with hx Alzheimer's dementia, A-fib on anticoagulation, venous insufficiency lower extremities, diastolic heart failure, CAD, CKD  stage III, hypertension, hyperlipidemia, OSA, who is transferred from [facility] ED with concern for cellulitis.   Cellulitis, nonpurulent, of right lower extremity Asymmetric right lower extremity edema -Continue vancomycin for now, will need to clarify allergy history with daughter as he is not able. -Obtain venous duplex of the right lower extremity -Elevation of bilateral lower extremities  Question acute exacerbation, diastolic heart failure -Lasix 20 mg IV x 1 -Check BNP, repeat TTE given bilateral although asymmetric edema, and history of murmur  Chronic medical problems:  Medication management: Please confirm med rec to the pharmacy in a.m. Alzheimer's dementia: Continue home donepezil A-fib: Continue home apixaban CAD/HLD: On DOAC per above, continue rosuvastatin CKD stage III: Creatinine baseline approximately 1.1 Hypertension: Continue  home lisinopril OSA: CPAP   Body mass index is 30.05 kg/m. Obesity class I    DVT prophylaxis:   On therapeutic anticoagulation Code Status:  Full Code Diet:  Diet Orders (From admission, onward)    None      Family Communication: No Consults: None Admission status:   Observation, Med-Surg  Severity of Illness: The appropriate patient status for this patient is OBSERVATION. Observation status is judged to be reasonable and necessary in order to provide the required intensity of service to ensure the patient's safety. The patient's presenting symptoms, physical exam findings, and initial radiographic and laboratory data in the context of their medical condition is felt to place them at decreased risk for further clinical deterioration. Furthermore, it is anticipated that the patient will be medically stable for discharge from the hospital within 2 midnights of admission.    Dolly Rias, MD [facility]  How to contact the [facility] Attending or Consulting provider 7A - 7P or covering provider during after hours 7P  -7A, for this patient.  Check the care team in [facility] and look for a) attending/consulting [facility] provider listed and b) the [facility] team listed Log into [email or web address] and use [facility]'s universal password to access. If you do not have the password, please contact the hospital operator. Locate the [facility] provider you are looking for under [facility] and page to a number that you can be directly reached. If you still have difficulty reaching the provider, please page the [facility] (Director on Call) for the Hospitalists listed on amion for assistance.  11/14/2022, 5:27 AM

## 2022-11-14 NOTE — Progress Notes (Signed)
*  PRELIMINARY RESULTS* Echocardiogram 2D Echocardiogram has been performed.  Earlie Server Aniruddh Ciavarella 11/14/2022, 2:25 PM

## 2022-11-14 NOTE — Progress Notes (Signed)
   11/14/22 2324  BiPAP/CPAP/SIPAP  Reason BIPAP/CPAP not in use Non-compliant

## 2022-11-14 NOTE — Progress Notes (Signed)
PROGRESS NOTE  [name] [name] GNF:[id number] DOB: 08/28/1940 DOA: 11/13/2022 PCP: [name], NP  HPI/Recap of past 24 hours: [name] [name] is a 82 y.o. male with hx of Alzheimer's dementia, A-fib on AC, BLE venous insufficiency, diastolic heart failure, CAD, CKD stage III, HTN, HLD, OSA, who presented with worsening BLE swelling worse in the RLE X 2-3 weeks. He had been seen by PCP, placed on clindamycin without improvement in his symptoms. Denies any associated fevers.  Reports compliance with home Lasix and apixaban.  Reported to be chronically on doxycycline and Macrobid.    Today, pt denies any new complaints, noted tenderness on RLE with some mild erythema and swelling. Denies any fever/chills, chest pain, SOB   Assessment/Plan: Principal Problem:   Cellulitis   ??Cellulitis, nonpurulent, RLE ?Venous stasis dermatitis/insufficiency  Currently afebrile, with no leukocytosis BC x 2 pending Lower extremity Doppler pending Switch to IV Ancef for now Elevate legs  Chronic diastolic HF ?Acute on chronic BNP 169 Last echo on 12/2021 showed EF of 65 to 70%, grade 2 diastolic dysfunction, repeat pending Continue IV Lasix Strict I's and O's, daily weight  CKD stage IIIa Creatinine around baseline Daily BMP  Paroxysmal A-fib Not on any rate controlling agents Continue home apixaban Monitor closely  CAD/HLD Continue statin  Hypertension BP somewhat stable Not on any BP meds  OSA CPAP  Obesity Lifestyle modification advised     Estimated body mass index is 30.05 kg/m as calculated from the following:   Height as of this encounter: 5\' 10"  (1.778 m).   Weight as of this encounter: 95 kg.     Code Status: Full  Family Communication: None at bedside  Disposition Plan: Status is: Inpatient The patient will require care spanning > 2 midnights and should be moved to inpatient because: Level of  care      Consultants: None  Procedures: None  Antimicrobials: Ancef  DVT prophylaxis: Eliquis   Objective: Vitals:   11/13/22 2148 11/14/22 0226 11/14/22 0424 11/14/22 0756  BP: (!) 161/72 (!) 158/62  (!) 151/77  Pulse: 67 (!) 57  66  Resp: 20 16  18   Temp: 97.7 F (36.5 C) 98.3 F (36.8 C)  97.6 F (36.4 C)  TempSrc:  Oral  Oral  SpO2: 98% 100%  97%  Weight:   95 kg   Height:        Intake/Output Summary (Last 24 hours) at 11/14/2022 1407 Last data filed at 11/14/2022 1200 Gross per 24 hour  Intake 530 ml  Output 350 ml  Net 180 ml   Filed Weights   11/13/22 2148 11/14/22 0424  Weight: 95.3 kg 95 kg    Exam: General: NAD  Cardiovascular: S1, S2 present Respiratory: CTAB Abdomen: Soft, nontender, nondistended, bowel sounds present Musculoskeletal: bilateral pedal edema noted, worse on RLE, noted mild erythema with tenderness Skin: As noted above Psychiatry: Normal mood    Data Reviewed: CBC: Recent Labs  Lab 11/13/22 2206 11/14/22 0417  WBC 5.4 5.5  NEUTROABS 3.5  --   HGB 13.5 14.6  HCT 39.7 46.0  MCV 94.5 98.1  PLT 248 238   Basic Metabolic Panel: Recent Labs  Lab 11/13/22 2206 11/14/22 0417  NA 139 137  K 3.8 4.1  CL 104 105  CO2 28 19*  GLUCOSE 164* 110*  BUN 22 19  CREATININE 1.19 1.14  CALCIUM 9.7 9.4  MG  --  2.2  PHOS  --  3.5   GFR: Estimated Creatinine  Clearance: 55.8 mL/min (by C-G formula based on SCr of 1.14 mg/dL). Liver Function Tests: Recent Labs  Lab 11/13/22 2206  AST 23  ALT 26  ALKPHOS 61  BILITOT 0.5  PROT 6.6  ALBUMIN 3.9   No results for input(s): "LIPASE", "AMYLASE" in the last 168 hours. No results for input(s): "AMMONIA" in the last 168 hours. Coagulation Profile: No results for input(s): "INR", "PROTIME" in the last 168 hours. Cardiac Enzymes: No results for input(s): "CKTOTAL", "CKMB", "CKMBINDEX", "TROPONINI" in the last 168 hours. BNP (last 3 results) No results for input(s):  "PROBNP" in the last 8760 hours. HbA1C: No results for input(s): "HGBA1C" in the last 72 hours. CBG: No results for input(s): "GLUCAP" in the last 168 hours. Lipid Profile: No results for input(s): "CHOL", "HDL", "LDLCALC", "TRIG", "CHOLHDL", "LDLDIRECT" in the last 72 hours. Thyroid Function Tests: No results for input(s): "TSH", "T4TOTAL", "FREET4", "T3FREE", "THYROIDAB" in the last 72 hours. Anemia Panel: No results for input(s): "VITAMINB12", "FOLATE", "FERRITIN", "TIBC", "IRON", "RETICCTPCT" in the last 72 hours. Urine analysis:    Component Value Date/Time   COLORURINE YELLOW 04/02/2021 0222   APPEARANCEUR CLEAR 04/02/2021 0222   LABSPEC 1.025 04/02/2021 0222   PHURINE 5.5 04/02/2021 0222   GLUCOSEU NEGATIVE 04/02/2021 0222   HGBUR NEGATIVE 04/02/2021 0222   BILIRUBINUR NEGATIVE 04/02/2021 0222   KETONESUR NEGATIVE 04/02/2021 0222   PROTEINUR NEGATIVE 04/02/2021 0222   NITRITE NEGATIVE 04/02/2021 0222   LEUKOCYTESUR TRACE (A) 04/02/2021 0222   Sepsis Labs: @LABRCNTIP (procalcitonin:4,lacticidven:4)  ) Recent Results (from the past 240 hour(s))  Culture, blood (Routine X 2) w Reflex to ID Panel     Status: None (Preliminary result)   Collection Time: 11/14/22  4:17 AM   Specimen: BLOOD LEFT HAND  Result Value Ref Range Status   Specimen Description BLOOD LEFT HAND  Final   Special Requests   Final    BOTTLES DRAWN AEROBIC ONLY Blood Culture adequate volume   Culture   Final    NO GROWTH < 12 HOURS Performed at [facility], [address], [place], [place] [address]    Report Status PENDING  Incomplete      Studies: No results found.  Scheduled Meds:  apixaban  5 mg Oral BID   donepezil  10 mg Oral QHS   rosuvastatin  5 mg Oral QODAY    Continuous Infusions:   ceFAZolin (ANCEF) IV       LOS: 0 days     Briant Cedar, MD [facility]  If 7PM-7AM, please contact night-coverage [email or web address] 11/14/2022, 2:07 PM

## 2022-11-14 NOTE — Plan of Care (Signed)

## 2022-11-15 DIAGNOSIS — L03116 Cellulitis of left lower limb: Secondary | ICD-10-CM | POA: Diagnosis not present

## 2022-11-15 LAB — BASIC METABOLIC PANEL
Anion gap: 8 (ref 5–15)
BUN: 16 mg/dL (ref 8–23)
CO2: 25 mmol/L (ref 22–32)
Calcium: 9.5 mg/dL (ref 8.9–10.3)
Chloride: 102 mmol/L (ref 98–111)
Creatinine, Ser: 1.14 mg/dL (ref 0.61–1.24)
GFR, Estimated: >60 mL/min (ref >60)
Glucose, Bld: 134 mg/dL — ABNORMAL HIGH (ref 70–99)
Potassium: 3.5 mmol/L (ref 3.5–5.1)
Sodium: 135 mmol/L (ref 135–145)

## 2022-11-15 LAB — GLUCOSE, CAPILLARY
Glucose-Capillary: 144 mg/dL — ABNORMAL HIGH (ref 70–99)
Glucose-Capillary: 101 mg/dL — ABNORMAL HIGH (ref 70–99)

## 2022-11-15 LAB — CBC WITH DIFFERENTIAL/PLATELET
Abs Immature Granulocytes: 0.02 10*3/uL (ref 0.00–0.07)
Basophils Absolute: 0.1 10*3/uL (ref 0.0–0.1)
Basophils Relative: 1 %
Eosinophils Absolute: 0.2 10*3/uL (ref 0.0–0.5)
Eosinophils Relative: 3 %
HCT: 42.1 % (ref 39.0–52.0)
Hemoglobin: 14.3 g/dL (ref 13.0–17.0)
Immature Granulocytes: 0 %
Lymphocytes Relative: 18 %
Lymphs Abs: 1.1 10*3/uL (ref 0.7–4.0)
MCH: 31.3 pg (ref 26.0–34.0)
MCHC: 34 g/dL (ref 30.0–36.0)
MCV: 92.1 fL (ref 80.0–100.0)
Monocytes Absolute: 0.7 10*3/uL (ref 0.1–1.0)
Monocytes Relative: 11 %
Neutro Abs: 4.2 10*3/uL (ref 1.7–7.7)
Neutrophils Relative %: 67 %
Platelets: 256 10*3/uL (ref 150–400)
RBC: 4.57 MIL/uL (ref 4.22–5.81)
RDW: 13 % (ref 11.5–15.5)
WBC: 6.2 10*3/uL (ref 4.0–10.5)
nRBC: 0 % (ref 0.0–0.2)

## 2022-11-15 MED ORDER — LOSARTAN POTASSIUM 50 MG PO TABS
25.0000 mg | ORAL_TABLET | Freq: Every day | ORAL | Status: DC
  Administered 2022-11-15 – 2022-11-17 (×3): 25 mg via ORAL
  Filled 2022-11-15 (×3): qty 1

## 2022-11-15 MED ORDER — ACETAMINOPHEN 325 MG PO TABS
650.0000 mg | ORAL_TABLET | Freq: Four times a day (QID) | ORAL | Status: AC | PRN
  Administered 2022-11-15 – 2022-11-16 (×2): 650 mg via ORAL
  Filled 2022-11-15 (×2): qty 2

## 2022-11-15 NOTE — Progress Notes (Signed)
Mobility Specialist: Progress Note   11/15/22 1630  Mobility  Activity Ambulated with assistance in hallway  Level of Assistance Contact guard assist, steadying assist  Assistive Device Front wheel walker  Distance Ambulated (ft) 80 ft  Activity Response Tolerated well  Mobility Referral Yes  $Mobility charge 1 Mobility  Mobility Specialist Start Time (ACUTE ONLY) 1003  Mobility Specialist Stop Time (ACUTE ONLY) 1018  Mobility Specialist Time Calculation (min) (ACUTE ONLY) 15 min    Pt was agreeable to mobility session - received in chair. Required minA for STS, CG for ambulation and bed mobility. No complaints throughout. Returned to room without fault. Left in bed with all needs met, call bell in reach.   Maurene Capes Mobility Specialist Please contact via SecureChat or Rehab office at [phone]

## 2022-11-15 NOTE — Progress Notes (Signed)
Pt self sufficient with home cpap unit.  11/15/22 2243  BiPAP/CPAP/SIPAP  BiPAP/CPAP/SIPAP Pt Type Adult (home)  Patient Home Equipment Yes  Safety Check Completed by RT for Home Unit Yes, no issues noted

## 2022-11-15 NOTE — Plan of Care (Signed)
  Problem: Clinical Measurements: Goal: Diagnostic test results will improve Outcome: Progressing Goal: Respiratory complications will improve Outcome: Progressing Goal: Cardiovascular complication will be avoided Outcome: Progressing   Problem: Coping: Goal: Level of anxiety will decrease Outcome: Progressing   Problem: Pain Managment: Goal: General experience of comfort will improve Outcome: Progressing   Problem: Safety: Goal: Ability to remain free from injury will improve Outcome: Progressing   Problem: Skin Integrity: Goal: Risk for impaired skin integrity will decrease Outcome: Progressing

## 2022-11-15 NOTE — Evaluation (Signed)
Occupational Therapy Evaluation Patient Details Name: [name] [name] MRN: [id number] DOB: [birth year] Today's Date: 11/15/2022   History of Present Illness Pt is an 82 y/o M presenting to ED on 9/15 with increased RLE edema, BLE ultrasound negative for DVT. Admitted for RLE cellulitis. PMH includes dementia, A fib on anticoagulation, venous insufficiency in BLE, diastolic heart failure, CAD, CKD III, HTN, HLD, OSA   Clinical Impression   Pt reports ind at baseline with ADLs and uses cane for mobility, lives with his daughter who works during the day. Pt currently needing set up -mod A for ADLs, min-mod A for bed mobility and CGA for transfers with RW. Pt able to walk functional household distance CGA, SpO2 97% at end of session. Difficulty reaching down towards feet when attempting LB ADL, would benefit from AE demo in future sessions. Pt reports currently having 3in1 at home, discussed use of 3in1 as shower seat for home. Pt presenting with  impairments listed below, will follow acutely. Recommend HHOT at d/c.       If plan is discharge home, recommend the following: A little help with walking and/or transfers;A lot of help with bathing/dressing/bathroom;Assistance with cooking/housework;Direct supervision/assist for financial management;Direct supervision/assist for medications management;Assist for transportation    Functional Status Assessment  Patient has had a recent decline in their functional status and demonstrates the ability to make significant improvements in function in a reasonable and predictable amount of time.  Equipment Recommendations  None recommended by OT (pt has needed DME)    Recommendations for Other Services PT consult     Precautions / Restrictions Precautions Precautions: Fall Restrictions Weight Bearing Restrictions: No      Mobility Bed Mobility Overal bed mobility: Needs Assistance Bed Mobility: Supine to Sit, Sit to Supine     Supine to sit: Mod  assist Sit to supine: Min assist   General bed mobility comments: trunk elevation when coming ot EOB, BLE"s when returning to bed    Transfers Overall transfer level: Needs assistance Equipment used: Rolling walker (2 wheels) Transfers: Sit to/from Stand Sit to Stand: Contact guard assist                  Balance Overall balance assessment: Needs assistance Sitting-balance support: Feet supported Sitting balance-Leahy Scale: Good     Standing balance support: During functional activity, Reliant on assistive device for balance Standing balance-Leahy Scale: Poor                             ADL either performed or assessed with clinical judgement   ADL Overall ADL's : Needs assistance/impaired Eating/Feeding: Set up   Grooming: Set up;Standing   Upper Body Bathing: Minimal assistance;Sitting   Lower Body Bathing: Moderate assistance;Sitting/lateral leans   Upper Body Dressing : Minimal assistance;Sitting   Lower Body Dressing: Moderate assistance;Sitting/lateral leans   Toilet Transfer: Contact guard assist;Ambulation;Rolling walker (2 wheels)   Toileting- Clothing Manipulation and Hygiene: Minimal assistance       Functional mobility during ADLs: Contact guard assist;Rolling walker (2 wheels)       Vision Baseline Vision/History: 1 Wears glasses Additional Comments: disconjugate gaze, WFL for basic ADL/mobility tasks     Perception Perception: Not tested       Praxis Praxis: Not tested       Pertinent Vitals/Pain Pain Assessment Pain Assessment: No/denies pain     Extremity/Trunk Assessment Upper Extremity Assessment Upper Extremity Assessment: Generalized weakness;RUE deficits/detail RUE Deficits /  Details: limited shoulder flexion, pt reports arthritis   Lower Extremity Assessment Lower Extremity Assessment: Defer to PT evaluation   Cervical / Trunk Assessment Cervical / Trunk Assessment: Kyphotic   Communication  Communication Communication: Hearing impairment   Cognition Arousal: Alert Behavior During Therapy: WFL for tasks assessed/performed Overall Cognitive Status: No family/caregiver present to determine baseline cognitive functioning                                 General Comments: hx of dementia     General Comments  SpO2 97% on RA at end of session    Exercises     Shoulder Instructions      Home Living Family/patient expects to be discharged to:: Private residence Living Arrangements: Children ([name])) Available Help at Discharge: Family;Available PRN/intermittently Type of Home: House Home Access: Level entry     Home Layout: One level     Bathroom Shower/Tub: Chief Strategy Officer: Handicapped height     Home Equipment: Agricultural consultant (2 wheels);Cane - single point;BSC/3in1          Prior Functioning/Environment Prior Level of Function : Needs assist             Mobility Comments: uses SPC for mobility ADLs Comments: ind, daughter does meds, daughter does transportation        OT Problem List: Decreased strength;Decreased range of motion;Decreased activity tolerance;Impaired balance (sitting and/or standing);Decreased knowledge of use of DME or AE      OT Treatment/Interventions: Therapeutic exercise;Self-care/ADL training;Energy conservation;DME and/or AE instruction;Therapeutic activities;Balance training;Patient/family education    OT Goals(Current goals can be found in the care plan section) Acute Rehab OT Goals Patient Stated Goal: none stated OT Goal Formulation: With patient Time For Goal Achievement: 11/29/22 Potential to Achieve Goals: Good  OT Frequency: Min 1X/week    Co-evaluation PT/OT/SLP Co-Evaluation/Treatment: Yes Reason for Co-Treatment: Other (comment) (imminent dc)   OT goals addressed during session: ADL's and self-care;Strengthening/ROM      AM-PAC OT "6 Clicks" Daily Activity      Outcome Measure Help from another person eating meals?: None Help from another person taking care of personal grooming?: A Little Help from another person toileting, which includes using toliet, bedpan, or urinal?: A Little Help from another person bathing (including washing, rinsing, drying)?: A Lot Help from another person to put on and taking off regular upper body clothing?: A Little Help from another person to put on and taking off regular lower body clothing?: A Lot 6 Click Score: 17   End of Session Equipment Utilized During Treatment: Gait belt;Rolling walker (2 wheels) Nurse Communication: Mobility status  Activity Tolerance: Patient tolerated treatment well Patient left: in bed;with call bell/phone within reach;with bed alarm set  OT Visit Diagnosis: Other abnormalities of gait and mobility (R26.89);Unsteadiness on feet (R26.81);Muscle weakness (generalized) (M62.81)                Time: 9562-1308 OT Time Calculation (min): 32 min Charges:  OT General Charges $OT Visit: 1 Visit OT Evaluation $OT Eval Moderate Complexity: 1 Mod  Alexiana Laverdure K, OTD, OTR/L SecureChat Preferred Acute Rehab [phone]   Matti Killingsworth K Koonce 11/15/2022, 3:05 PM

## 2022-11-15 NOTE — Progress Notes (Signed)
Pt placed on CPAP with no issues.

## 2022-11-15 NOTE — Progress Notes (Signed)
CALCIUM 9.7 9.4 9.5  MG  --  2.2  --   PHOS  --  3.5  --    GFR: Estimated Creatinine Clearance: 54.2 mL/min (by C-G formula based on SCr of 1.14 mg/dL). Liver Function Tests: Recent Labs  Lab 11/13/22 2206  AST 23  ALT 26  ALKPHOS 61  BILITOT 0.5  PROT 6.6  ALBUMIN 3.9   No results for input(s): "LIPASE", "AMYLASE" in the last 168 hours. No results for input(s): "AMMONIA" in the last 168 hours. Coagulation Profile: No results for input(s): "INR", "PROTIME" in the last 168 hours. Cardiac Enzymes: No results for input(s): "CKTOTAL", "CKMB",  "CKMBINDEX", "TROPONINI" in the last 168 hours. BNP (last 3 results) No results for input(s): "PROBNP" in the last 8760 hours. HbA1C: No results for input(s): "HGBA1C" in the last 72 hours. CBG: Recent Labs  Lab 11/14/22 2146 11/15/22 0736 11/15/22 1149  GLUCAP 170* 144* 101*   Lipid Profile: No results for input(s): "CHOL", "HDL", "LDLCALC", "TRIG", "CHOLHDL", "LDLDIRECT" in the last 72 hours. Thyroid Function Tests: No results for input(s): "TSH", "T4TOTAL", "FREET4", "T3FREE", "THYROIDAB" in the last 72 hours. Anemia Panel: No results for input(s): "VITAMINB12", "FOLATE", "FERRITIN", "TIBC", "IRON", "RETICCTPCT" in the last 72 hours. Urine analysis:    Component Value Date/Time   COLORURINE YELLOW 04/02/2021 0222   APPEARANCEUR CLEAR 04/02/2021 0222   LABSPEC 1.025 04/02/2021 0222   PHURINE 5.5 04/02/2021 0222   GLUCOSEU NEGATIVE 04/02/2021 0222   HGBUR NEGATIVE 04/02/2021 0222   BILIRUBINUR NEGATIVE 04/02/2021 0222   KETONESUR NEGATIVE 04/02/2021 0222   PROTEINUR NEGATIVE 04/02/2021 0222   NITRITE NEGATIVE 04/02/2021 0222   LEUKOCYTESUR TRACE (A) 04/02/2021 0222   Sepsis Labs: @LABRCNTIP (procalcitonin:4,lacticidven:4)  ) Recent Results (from the past 240 hour(s))  Culture, blood (routine x 2)     Status: None (Preliminary result)   Collection Time: 11/13/22 11:30 PM   Specimen: BLOOD  Result Value Ref Range Status   Specimen Description   Final    BLOOD BLOOD LEFT FOREARM Performed at [facility], [address], [place], [place] [address]    Special Requests   Final    BOTTLES DRAWN AEROBIC AND ANAEROBIC Blood Culture adequate volume Performed at [facility], [address] [place], [place] [address]    Culture   Final    NO GROWTH < 24 HOURS Performed at [facility], [address] [place], [place] [address]    Report Status PENDING  Incomplete  Culture, blood (Routine X 2) w Reflex to ID Panel      Status: None (Preliminary result)   Collection Time: 11/14/22  4:17 AM   Specimen: BLOOD LEFT HAND  Result Value Ref Range Status   Specimen Description BLOOD LEFT HAND  Final   Special Requests   Final    BOTTLES DRAWN AEROBIC ONLY Blood Culture adequate volume   Culture   Final    NO GROWTH < 12 HOURS Performed at [facility], [address] [place], [place] [address]    Report Status PENDING  Incomplete      Studies: VAS [place] LOWER EXTREMITY VENOUS (DVT)  Result Date: 11/15/2022  Lower Venous DVT Study Patient Name:  [name] [name]  Date of Exam:   11/14/2022 Medical Rec #: [id number]        Accession #:    [id number] Date of Birth: [birth year]        Patient Gender: M Patient Age:   82 years Exam  PROGRESS NOTE  OVAL LITTREL ZOX:[id number] DOB: September 24, 1940 DOA: 11/13/2022 PCP: [name], NP  HPI/Recap of past 24 hours: [name] [name] is a 82 y.o. male with hx of Alzheimer's dementia, A-fib on AC, BLE venous insufficiency, diastolic heart failure, CAD, CKD stage III, HTN, HLD, OSA, who presented with worsening BLE swelling worse in the RLE X 2-3 weeks. He had been seen by PCP, placed on clindamycin without improvement in his symptoms. Denies any associated fevers.  Reports compliance with home Lasix and apixaban.  Reported to be chronically on doxycycline and Macrobid.    Today, pt with some generalized weakness, otherwise denies any new complaints   Assessment/Plan: Principal Problem:   Cellulitis   ??Cellulitis, nonpurulent, RLE ?Venous stasis dermatitis/insufficiency  Currently afebrile, with no leukocytosis BC x 2 NGTD Bilateral Lower extremity Doppler neg for DVT Switch to IV Ancef Elevate legs PT/OT- pending eval  Possibly acute on chronic diastolic HF BNP 169 BLE edema Echo showed EF of 70 to 75%, no RWMA, grade 1 diastolic dysfunction S/p IV Lasix, diuresing well-->PO lasix 40 mg Strict I's and O's, daily weight  CKD stage IIIa Creatinine around baseline Daily BMP  Paroxysmal A-fib Not on any rate controlling agents Continue home apixaban Monitor closely  CAD/HLD Continue statin  Hypertension BP uncontrolled Not on any BP meds PTA, started on Losartan 25 mg daily  OSA CPAP  Obesity Lifestyle modification advised     Estimated body mass index is 28.22 kg/m as calculated from the following:   Height as of this encounter: 5\' 10"  (1.778 m).   Weight as of this encounter: 89.2 kg.     Code Status: Full  Family Communication: None at bedside  Disposition Plan: Status is: Inpatient The patient will require care spanning > 2 midnights and should be moved to inpatient because: Level of  care      Consultants: None  Procedures: None  Antimicrobials: Ancef  DVT prophylaxis: Eliquis   Objective: Vitals:   11/14/22 2300 11/15/22 0105 11/15/22 0411 11/15/22 0803  BP:   (!) 150/78 (!) 162/83  Pulse:   77 80  Resp:   18 19  Temp:   97.9 F (36.6 C) 98.2 F (36.8 C)  TempSrc:   Oral Oral  SpO2:   94% 97%  Weight: 89.2 kg 89.2 kg    Height:        Intake/Output Summary (Last 24 hours) at 11/15/2022 1513 Last data filed at 11/15/2022 0640 Gross per 24 hour  Intake 540 ml  Output 2400 ml  Net -1860 ml   Filed Weights   11/14/22 0424 11/14/22 2300 11/15/22 0105  Weight: 95 kg 89.2 kg 89.2 kg    Exam: General: NAD  Cardiovascular: S1, S2 present Respiratory: CTAB Abdomen: Soft, nontender, nondistended, bowel sounds present Musculoskeletal: Improving bilateral pedal edema noted, worse on RLE, noted mild erythema with tenderness Skin: As noted above Psychiatry: Normal mood    Data Reviewed: CBC: Recent Labs  Lab 11/13/22 2206 11/14/22 0417 11/15/22 0425  WBC 5.4 5.5 6.2  NEUTROABS 3.5  --  4.2  HGB 13.5 14.6 14.3  HCT 39.7 46.0 42.1  MCV 94.5 98.1 92.1  PLT 248 238 256   Basic Metabolic Panel: Recent Labs  Lab 11/13/22 2206 11/14/22 0417 11/15/22 0425  NA 139 137 135  K 3.8 4.1 3.5  CL 104 105 102  CO2 28 19* 25  GLUCOSE 164* 110* 134*  BUN 22 19 16   CREATININE 1.19 1.14 1.14  CALCIUM 9.7 9.4 9.5  MG  --  2.2  --   PHOS  --  3.5  --    GFR: Estimated Creatinine Clearance: 54.2 mL/min (by C-G formula based on SCr of 1.14 mg/dL). Liver Function Tests: Recent Labs  Lab 11/13/22 2206  AST 23  ALT 26  ALKPHOS 61  BILITOT 0.5  PROT 6.6  ALBUMIN 3.9   No results for input(s): "LIPASE", "AMYLASE" in the last 168 hours. No results for input(s): "AMMONIA" in the last 168 hours. Coagulation Profile: No results for input(s): "INR", "PROTIME" in the last 168 hours. Cardiac Enzymes: No results for input(s): "CKTOTAL", "CKMB",  "CKMBINDEX", "TROPONINI" in the last 168 hours. BNP (last 3 results) No results for input(s): "PROBNP" in the last 8760 hours. HbA1C: No results for input(s): "HGBA1C" in the last 72 hours. CBG: Recent Labs  Lab 11/14/22 2146 11/15/22 0736 11/15/22 1149  GLUCAP 170* 144* 101*   Lipid Profile: No results for input(s): "CHOL", "HDL", "LDLCALC", "TRIG", "CHOLHDL", "LDLDIRECT" in the last 72 hours. Thyroid Function Tests: No results for input(s): "TSH", "T4TOTAL", "FREET4", "T3FREE", "THYROIDAB" in the last 72 hours. Anemia Panel: No results for input(s): "VITAMINB12", "FOLATE", "FERRITIN", "TIBC", "IRON", "RETICCTPCT" in the last 72 hours. Urine analysis:    Component Value Date/Time   COLORURINE YELLOW 04/02/2021 0222   APPEARANCEUR CLEAR 04/02/2021 0222   LABSPEC 1.025 04/02/2021 0222   PHURINE 5.5 04/02/2021 0222   GLUCOSEU NEGATIVE 04/02/2021 0222   HGBUR NEGATIVE 04/02/2021 0222   BILIRUBINUR NEGATIVE 04/02/2021 0222   KETONESUR NEGATIVE 04/02/2021 0222   PROTEINUR NEGATIVE 04/02/2021 0222   NITRITE NEGATIVE 04/02/2021 0222   LEUKOCYTESUR TRACE (A) 04/02/2021 0222   Sepsis Labs: @LABRCNTIP (procalcitonin:4,lacticidven:4)  ) Recent Results (from the past 240 hour(s))  Culture, blood (routine x 2)     Status: None (Preliminary result)   Collection Time: 11/13/22 11:30 PM   Specimen: BLOOD  Result Value Ref Range Status   Specimen Description   Final    BLOOD BLOOD LEFT FOREARM Performed at [facility], [address], [place], [place] [address]    Special Requests   Final    BOTTLES DRAWN AEROBIC AND ANAEROBIC Blood Culture adequate volume Performed at [facility], [address] [place], [place] [address]    Culture   Final    NO GROWTH < 24 HOURS Performed at [facility], [address] [place], [place] [address]    Report Status PENDING  Incomplete  Culture, blood (Routine X 2) w Reflex to ID Panel      Status: None (Preliminary result)   Collection Time: 11/14/22  4:17 AM   Specimen: BLOOD LEFT HAND  Result Value Ref Range Status   Specimen Description BLOOD LEFT HAND  Final   Special Requests   Final    BOTTLES DRAWN AEROBIC ONLY Blood Culture adequate volume   Culture   Final    NO GROWTH < 12 HOURS Performed at [facility], [address] [place], [place] [address]    Report Status PENDING  Incomplete      Studies: VAS [place] LOWER EXTREMITY VENOUS (DVT)  Result Date: 11/15/2022  Lower Venous DVT Study Patient Name:  [name] [name]  Date of Exam:   11/14/2022 Medical Rec #: [id number]        Accession #:    [id number] Date of Birth: [birth year]        Patient Gender: M Patient Age:   82 years Exam  PROGRESS NOTE  OVAL LITTREL ZOX:[id number] DOB: September 24, 1940 DOA: 11/13/2022 PCP: [name], NP  HPI/Recap of past 24 hours: [name] [name] is a 82 y.o. male with hx of Alzheimer's dementia, A-fib on AC, BLE venous insufficiency, diastolic heart failure, CAD, CKD stage III, HTN, HLD, OSA, who presented with worsening BLE swelling worse in the RLE X 2-3 weeks. He had been seen by PCP, placed on clindamycin without improvement in his symptoms. Denies any associated fevers.  Reports compliance with home Lasix and apixaban.  Reported to be chronically on doxycycline and Macrobid.    Today, pt with some generalized weakness, otherwise denies any new complaints   Assessment/Plan: Principal Problem:   Cellulitis   ??Cellulitis, nonpurulent, RLE ?Venous stasis dermatitis/insufficiency  Currently afebrile, with no leukocytosis BC x 2 NGTD Bilateral Lower extremity Doppler neg for DVT Switch to IV Ancef Elevate legs PT/OT- pending eval  Possibly acute on chronic diastolic HF BNP 169 BLE edema Echo showed EF of 70 to 75%, no RWMA, grade 1 diastolic dysfunction S/p IV Lasix, diuresing well-->PO lasix 40 mg Strict I's and O's, daily weight  CKD stage IIIa Creatinine around baseline Daily BMP  Paroxysmal A-fib Not on any rate controlling agents Continue home apixaban Monitor closely  CAD/HLD Continue statin  Hypertension BP uncontrolled Not on any BP meds PTA, started on Losartan 25 mg daily  OSA CPAP  Obesity Lifestyle modification advised     Estimated body mass index is 28.22 kg/m as calculated from the following:   Height as of this encounter: 5\' 10"  (1.778 m).   Weight as of this encounter: 89.2 kg.     Code Status: Full  Family Communication: None at bedside  Disposition Plan: Status is: Inpatient The patient will require care spanning > 2 midnights and should be moved to inpatient because: Level of  care      Consultants: None  Procedures: None  Antimicrobials: Ancef  DVT prophylaxis: Eliquis   Objective: Vitals:   11/14/22 2300 11/15/22 0105 11/15/22 0411 11/15/22 0803  BP:   (!) 150/78 (!) 162/83  Pulse:   77 80  Resp:   18 19  Temp:   97.9 F (36.6 C) 98.2 F (36.8 C)  TempSrc:   Oral Oral  SpO2:   94% 97%  Weight: 89.2 kg 89.2 kg    Height:        Intake/Output Summary (Last 24 hours) at 11/15/2022 1513 Last data filed at 11/15/2022 0640 Gross per 24 hour  Intake 540 ml  Output 2400 ml  Net -1860 ml   Filed Weights   11/14/22 0424 11/14/22 2300 11/15/22 0105  Weight: 95 kg 89.2 kg 89.2 kg    Exam: General: NAD  Cardiovascular: S1, S2 present Respiratory: CTAB Abdomen: Soft, nontender, nondistended, bowel sounds present Musculoskeletal: Improving bilateral pedal edema noted, worse on RLE, noted mild erythema with tenderness Skin: As noted above Psychiatry: Normal mood    Data Reviewed: CBC: Recent Labs  Lab 11/13/22 2206 11/14/22 0417 11/15/22 0425  WBC 5.4 5.5 6.2  NEUTROABS 3.5  --  4.2  HGB 13.5 14.6 14.3  HCT 39.7 46.0 42.1  MCV 94.5 98.1 92.1  PLT 248 238 256   Basic Metabolic Panel: Recent Labs  Lab 11/13/22 2206 11/14/22 0417 11/15/22 0425  NA 139 137 135  K 3.8 4.1 3.5  CL 104 105 102  CO2 28 19* 25  GLUCOSE 164* 110* 134*  BUN 22 19 16   CREATININE 1.19 1.14 1.14

## 2022-11-15 NOTE — Evaluation (Signed)
Physical Therapy Evaluation Patient Details Name: [name] [name] MRN: [id number] DOB: 09-02-1940 Today's Date: 11/15/2022  History of Present Illness  Pt is an 82 y/o M presenting to ED on 9/15 with increased RLE edema, BLE ultrasound negative for DVT. Admitted for RLE cellulitis. PMH includes dementia, A fib on anticoagulation, venous insufficiency in BLE, diastolic heart failure, CAD, CKD III, HTN, HLD, OSA  Clinical Impression  Patient presents with decreased mobility compared to his baseline.  Reports in the bed 2 days since admission though only in hospital one day.  Patient typically ambulates with cane at home per his report and manages his meals while daughter at work for breakfast and lunch.  Currently needing mod A for supine to sit and CGA for mobility with RW in hallway.  Feel he will benefit from skilled PT in the acute setting and from follow up HHPT at d/c.       If plan is discharge home, recommend the following: Assistance with cooking/housework;Assist for transportation;Help with stairs or ramp for entrance;A little help with walking and/or transfers   Can travel by private vehicle        Equipment Recommendations None recommended by PT  Recommendations for Other Services       Functional Status Assessment Patient has had a recent decline in their functional status and demonstrates the ability to make significant improvements in function in a reasonable and predictable amount of time.     Precautions / Restrictions Precautions Precautions: Fall Restrictions Weight Bearing Restrictions: No      Mobility  Bed Mobility Overal bed mobility: Needs Assistance Bed Mobility: Supine to Sit, Sit to Supine     Supine to sit: Mod assist, HOB elevated Sit to supine: Min assist   General bed mobility comments: trunk elevation when coming ot EOB, BLE"s when returning to bed    Transfers Overall transfer level: Needs assistance Equipment used: Rolling walker (2  wheels) Transfers: Sit to/from Stand Sit to Stand: Contact guard assist           General transfer comment: increased time, heavy UE use to stand from regular height bed    Ambulation/Gait Ambulation/Gait assistance: Contact guard assist Gait Distance (Feet): 180 Feet Assistive device: Rolling walker (2 wheels) Gait Pattern/deviations: Step-through pattern, Step-to pattern, Decreased stride length, Trunk flexed       General Gait Details: assist for safety and balance, pt reports feeling a little wobbly more than usual  Stairs            Wheelchair Mobility     Tilt Bed    Modified Rankin (Stroke Patients Only)       Balance Overall balance assessment: Needs assistance   Sitting balance-Leahy Scale: Fair Sitting balance - Comments: able to cross one foot over knee but with LOB to one side, static balance without issuess   Standing balance support: Bilateral upper extremity supported, Reliant on assistive device for balance Standing balance-Leahy Scale: Poor                               Pertinent Vitals/Pain Pain Assessment Pain Assessment: No/denies pain    Home Living Family/patient expects to be discharged to:: Private residence Living Arrangements: Children (daughter) Available Help at Discharge: Family;Available PRN/intermittently Type of Home: House Home Access: Stairs to enter   Entrance Stairs-Number of Steps: 1   Home Layout: One level Home Equipment: Agricultural consultant (2 wheels);Cane - single point;BSC/3in1 Additional  Comments: daughter is a bus driver    Prior Function Prior Level of Function : Needs assist             Mobility Comments: uses SPC for mobility ADLs Comments: ind, daughter does meds, daughter does transportation     Extremity/Trunk Assessment   Upper Extremity Assessment Upper Extremity Assessment: Defer to OT evaluation RUE Deficits / Details: limited shoulder flexion, pt reports arthritis    Lower  Extremity Assessment Lower Extremity Assessment: LLE deficits/detail;RLE deficits/detail RLE Deficits / Details: AROM grossly WFL, though pt reports stiffness, noted hemosiderin staining on both lower legs, strength grossly 4+/5 throughout RLE Sensation: decreased light touch LLE Deficits / Details: AROM grossly WFL, though pt reports stiffness, noted hemosiderin staining on both lower legs, strength grossly 4+/5 throughout LLE Sensation: decreased light touch    Cervical / Trunk Assessment Cervical / Trunk Assessment: Kyphotic  Communication   Communication Communication: Hearing impairment  Cognition Arousal: Alert Behavior During Therapy: WFL for tasks assessed/performed Overall Cognitive Status: History of cognitive impairments - at baseline                                 General Comments: hx of dementia        General Comments General comments (skin integrity, edema, etc.): SpO2 97% on RA after ambulation    Exercises     Assessment/Plan    PT Assessment Patient needs continued PT services  PT Problem List Decreased strength;Decreased balance;Decreased mobility;Decreased knowledge of precautions;Decreased safety awareness;Decreased knowledge of use of DME       PT Treatment Interventions DME instruction;Gait training;Patient/family education;Therapeutic activities;Therapeutic exercise;Balance training;Functional mobility training    PT Goals (Current goals can be found in the Care Plan section)  Acute Rehab PT Goals Patient Stated Goal: to return to independent PT Goal Formulation: With patient Time For Goal Achievement: 11/29/22 Potential to Achieve Goals: Good    Frequency Min 1X/week     Co-evaluation PT/OT/SLP Co-Evaluation/Treatment: Yes Reason for Co-Treatment: Other (comment) (imminent d/c) PT goals addressed during session: Mobility/safety with mobility;Balance OT goals addressed during session: ADL's and self-care;Strengthening/ROM        AM-PAC PT "6 Clicks" Mobility  Outcome Measure Help needed turning from your back to your side while in a flat bed without using bedrails?: A Little Help needed moving from lying on your back to sitting on the side of a flat bed without using bedrails?: A Little Help needed moving to and from a bed to a chair (including a wheelchair)?: A Little Help needed standing up from a chair using your arms (e.g., wheelchair or bedside chair)?: A Little Help needed to walk in hospital room?: A Little Help needed climbing 3-5 steps with a railing? : Total 6 Click Score: 16    End of Session Equipment Utilized During Treatment: Gait belt Activity Tolerance: Patient tolerated treatment well Patient left: in bed;with call bell/phone within reach;with bed alarm set   PT Visit Diagnosis: Other abnormalities of gait and mobility (R26.89);Muscle weakness (generalized) (M62.81)    Time: 4098-1191 PT Time Calculation (min) (ACUTE ONLY): 28 min   Charges:   PT Evaluation $PT Eval Moderate Complexity: 1 Mod   PT General Charges $$ ACUTE PT VISIT: 1 Visit         Sheran Lawless, PT Acute Rehabilitation Services Office:[phone] 11/15/2022   Elray Mcgregor 11/15/2022, 4:39 PM

## 2022-11-16 DIAGNOSIS — L03115 Cellulitis of right lower limb: Secondary | ICD-10-CM | POA: Diagnosis not present

## 2022-11-16 DIAGNOSIS — R600 Localized edema: Secondary | ICD-10-CM

## 2022-11-16 MED ORDER — POTASSIUM CHLORIDE CRYS ER 20 MEQ PO TBCR
40.0000 meq | EXTENDED_RELEASE_TABLET | Freq: Once | ORAL | Status: AC
  Administered 2022-11-16: 40 meq via ORAL
  Filled 2022-11-16: qty 2

## 2022-11-16 MED ORDER — ACETAMINOPHEN 325 MG PO TABS
650.0000 mg | ORAL_TABLET | Freq: Four times a day (QID) | ORAL | Status: DC | PRN
  Administered 2022-11-16 – 2022-11-21 (×6): 650 mg via ORAL
  Filled 2022-11-16 (×6): qty 2

## 2022-11-16 NOTE — Plan of Care (Signed)

## 2022-11-16 NOTE — Plan of Care (Signed)

## 2022-11-16 NOTE — Progress Notes (Signed)
Mobility Specialist: Progress Note   11/16/22 1231  Mobility  Activity Ambulated with assistance in hallway  Level of Assistance Minimal assist, patient does 75% or more  Assistive Device Front wheel walker  Distance Ambulated (ft) 50 ft  Activity Response Tolerated well  Mobility Referral Yes  $Mobility charge 1 Mobility  Mobility Specialist Start Time (ACUTE ONLY) 1036  Mobility Specialist Stop Time (ACUTE ONLY) 1056  Mobility Specialist Time Calculation (min) (ACUTE ONLY) 20 min    Pt was agreeable to mobility session with encouragement - received in chair. Pt did worse today than he did yesterday. C/o of being extremely tired and uncomfortable from sitting in the chair for 2 hours. Also c/o RUE weakness d/t R shoulder pain; was barely able to lift R arm up, had to use L arm to assist. Seemed much more confused today; had to be instructed multiples times about pushing off from the chair arm and when to stand - pt would agree to instructions and still not do them. Required tactile cues to follow directions. MinA for ambulation d/t weakness and still c/o R shoulder pain during ambulation. Pt stated "I don't know what's wrong with me today". Returned to room without fault. Attempted to use verbal and tactile cues to instruct him to adjust himself in the bed but pt was not receptive. Ultimately required ModA +2 for bed mobility with assistance from NT. Left in bed with all needs met, call bell in reach. Bed alarm on.   Maurene Capes Mobility Specialist Please contact via SecureChat or Rehab office at [phone]

## 2022-11-16 NOTE — Progress Notes (Signed)
Physical Therapy Treatment Patient Details Name: [name] [name] MRN: [id number] DOB: Jul 25, 1940 Today's Date: 11/16/2022   History of Present Illness Pt is an 82 y/o M presenting to ED on 9/15 with increased RLE edema, BLE ultrasound negative for DVT. Admitted for RLE cellulitis. PMH includes dementia, A fib on anticoagulation, venous insufficiency in BLE, diastolic heart failure, CAD, CKD III, HTN, HLD, OSA    PT Comments  Pt with poor tolerance to treatment today. Session today limited after pt urinated in hallway during ambulation. Pt required significantly more assist today compared to yesterday for bed mobility and sit to stand possibly due to sundowning with cognitive deficits. No change in DC/DME recs at this time however if pt mobility status continues to trend down then possibly change DC recs to SNF. PT will continue to follow.    If plan is discharge home, recommend the following: Assistance with cooking/housework;Assist for transportation;Help with stairs or ramp for entrance;A little help with walking and/or transfers   Can travel by private vehicle        Equipment Recommendations  None recommended by PT    Recommendations for Other Services       Precautions / Restrictions Precautions Precautions: Fall Restrictions Weight Bearing Restrictions: No     Mobility  Bed Mobility Overal bed mobility: Needs Assistance Bed Mobility: Supine to Sit, Sit to Supine     Supine to sit: Max assist Sit to supine: Max assist   General bed mobility comments: trunk elevation when coming ot EOB, BLE"s when returning to bed    Transfers Overall transfer level: Needs assistance Equipment used: Rolling walker (2 wheels) Transfers: Sit to/from Stand Sit to Stand: Mod assist, From elevated surface           General transfer comment: increased time, heavy UE use to stand. Mod A to power up due to no use of RUE.    Ambulation/Gait Ambulation/Gait assistance: Contact guard  assist Gait Distance (Feet): 10 Feet Assistive device: Rolling walker (2 wheels) Gait Pattern/deviations: Step-through pattern, Step-to pattern, Decreased stride length, Trunk flexed Gait velocity: decreased     General Gait Details: assist for safety and balance. Once at door began to urinate on the floor prompting return to bed to pericare.   Stairs             Wheelchair Mobility     Tilt Bed    Modified Rankin (Stroke Patients Only)       Balance Overall balance assessment: Needs assistance Sitting-balance support: Feet supported Sitting balance-Leahy Scale: Fair Sitting balance - Comments: able to cross one foot over knee but with LOB to one side, static balance without issuess   Standing balance support: Bilateral upper extremity supported, Reliant on assistive device for balance Standing balance-Leahy Scale: Poor                              Cognition Arousal: Alert Behavior During Therapy: WFL for tasks assessed/performed Overall Cognitive Status: History of cognitive impairments - at baseline                                 General Comments: hx of dementia        Exercises      General Comments General comments (skin integrity, edema, etc.): VSS      Pertinent Vitals/Pain Pain Assessment Pain Assessment: Faces Faces Pain Scale:  Hurts little more Pain Location: R shoulder Pain Descriptors / Indicators: Guarding, Discomfort, Grimacing Pain Intervention(s): Monitored during session, Limited activity within patient's tolerance, Repositioned    Home Living                          Prior Function            PT Goals (current goals can now be found in the care plan section) Progress towards PT goals: Progressing toward goals    Frequency    Min 1X/week      PT Plan      Co-evaluation              AM-PAC PT "6 Clicks" Mobility   Outcome Measure  Help needed turning from your back to your  side while in a flat bed without using bedrails?: A Lot Help needed moving from lying on your back to sitting on the side of a flat bed without using bedrails?: A Lot Help needed moving to and from a bed to a chair (including a wheelchair)?: A Lot Help needed standing up from a chair using your arms (e.g., wheelchair or bedside chair)?: A Lot Help needed to walk in hospital room?: A Little Help needed climbing 3-5 steps with a railing? : Total 6 Click Score: 12    End of Session Equipment Utilized During Treatment: Gait belt Activity Tolerance: Patient tolerated treatment well Patient left: in bed;with call bell/phone within reach;with bed alarm set Nurse Communication: Mobility status PT Visit Diagnosis: Other abnormalities of gait and mobility (R26.89);Muscle weakness (generalized) (M62.81)     Time: 8469-6295 PT Time Calculation (min) (ACUTE ONLY): 30 min  Charges:    $Gait Training: 8-22 mins $Therapeutic Activity: 8-22 mins PT General Charges $$ ACUTE PT VISIT: 1 Visit                     Shela Nevin, PT, DPT Acute Rehab Services [phone]    [name] 11/16/2022, 3:32 PM

## 2022-11-16 NOTE — Progress Notes (Signed)
Pt has home CPAP at bedside and manages himself when he is ready for bed.

## 2022-11-16 NOTE — Progress Notes (Signed)
[facility]                                                                               [name] [name], is a 82 y.o. male, DOB - 1940/08/14, ZYS:[id number] Admit date - 11/13/2022    Outpatient Primary MD for the patient is [name], NP  LOS - 2  days    Brief summary   [name] [name] is a 82 y.o. male with hx of Alzheimer's dementia, A-fib on AC, BLE venous insufficiency, diastolic heart failure, CAD, CKD stage III, HTN, HLD, OSA, who presented with worsening BLE swelling worse in the RLE X 2-3 weeks. He had been seen by PCP, placed on clindamycin without improvement in his symptoms. Denies any associated fevers.  Reports compliance with home Lasix and apixaban.  Reported to be chronically on doxycycline and Macrobid.    Assessment & Plan    Assessment and Plan:  Nonpurulent lower extremity cellulitis on the right in the setting of chronic venous stasis dermatitis/venous insufficiency Bilateral lower extremity Dopplers negative for DVT. Blood cultures have been negative so far. Continue with IV antibiotics for another 24 hours.    Mild acute on chronic diastolic heart failure Elevated BNP on admission and with bilateral lower extremity edema. Echocardiogram showed preserved left ventricular ejection fraction with no regional wall motion abnormalities, has grade 1 diastolic dysfunction.  Received IV Lasix with good diuresis transition to oral Lasix 40 mg daily Continue with strict intake and output and daily weights    Stage IIIa CKD Creatinine appears to be at baseline.    Paroxysmal atrial fibrillation Currently not in any rate controlling agents.  Patient is on Eliquis continue the same.   Essential hypertension Blood pressure parameters have improved with losartan 25 mg daily.     Estimated body mass index is 28.79 kg/m as calculated from the following:   Height as of this encounter: 5\' 10"  (1.778 m).   Weight as of this encounter: 91  kg.  Code Status: full code DVT Prophylaxis:   apixaban (ELIQUIS) tablet 5 mg   Level of Care: Level of care: Med-Surg Family Communication: None at bedside  Disposition Plan:     Remains inpatient appropriate: Possible discharge in the morning  Procedures:  None   Consultants:   None   Antimicrobials:   Anti-infectives (From admission, onward)    Start     Dose/Rate Route Frequency Ordered Stop   11/15/22 0700  nitrofurantoin (macrocrystal-monohydrate) (MACROBID) capsule 100 mg        100 mg Oral Every morning 11/14/22 1428     11/14/22 1430  ceFAZolin (ANCEF) IVPB 2g/100 mL premix        2 g 200 mL/hr over 30 Minutes Intravenous Every 8 hours 11/14/22 1339     11/14/22 0800  doxycycline (VIBRA-TABS) tablet 100 mg  Status:  Discontinued        100 mg Oral Every 24 hours 11/14/22 0317 11/14/22 1339   11/14/22 0230  vancomycin (VANCOCIN) IVPB 1000 mg/200 mL premix  Status:  Discontinued       Placed in "Followed by" Linked Group   1,000 mg 200 mL/hr over 60 Minutes  Intravenous  Once 11/14/22 0112 11/14/22 1339   11/14/22 0115  vancomycin (VANCOCIN) IVPB 1000 mg/200 mL premix       Placed in "Followed by" Linked Group   1,000 mg 200 mL/hr over 60 Minutes Intravenous  Once 11/14/22 0112 11/14/22 0700   11/13/22 2315  clindamycin (CLEOCIN) IVPB 600 mg        600 mg 100 mL/hr over 30 Minutes Intravenous  Once 11/13/22 2314 11/14/22 0037        Medications  Scheduled Meds:  apixaban  5 mg Oral BID   donepezil  10 mg Oral QHS   fesoterodine  8 mg Oral Daily   furosemide  40 mg Intravenous Daily   losartan  25 mg Oral Daily   mirabegron ER  50 mg Oral q morning   nitrofurantoin (macrocrystal-monohydrate)  100 mg Oral q AM   rosuvastatin  5 mg Oral QODAY   Continuous Infusions:   ceFAZolin (ANCEF) IV 2 g (11/16/22 1347)   PRN Meds:.acetaminophen    Subjective:   [name] [name] was seen and examined today.   Objective:   Vitals:   11/16/22 0500 11/16/22 0506  11/16/22 0803 11/16/22 1351  BP:  (!) 140/86 (!) 158/78 126/65  Pulse:  79 78 95  Resp:  18 18 18   Temp:   98.6 F (37 C) 98.5 F (36.9 C)  TempSrc:   Oral Oral  SpO2:  98% 93% 96%  Weight: 91 kg     Height:        Intake/Output Summary (Last 24 hours) at 11/16/2022 1757 Last data filed at 11/16/2022 1515 Gross per 24 hour  Intake 480 ml  Output 2100 ml  Net -1620 ml   Filed Weights   11/14/22 2300 11/15/22 0105 11/16/22 0500  Weight: 89.2 kg 89.2 kg 91 kg     Exam General: Alert and oriented x 3, NAD Cardiovascular: S1 S2 auscultated, no murmurs, RRR Respiratory: Clear to auscultation bilaterally, no wheezing, rales or rhonchi Gastrointestinal: Soft, nontender, nondistended, + bowel sounds Ext: no pedal edema bilaterally Neuro: AAOx3, Cr N's II- XII. Strength 5/5 upper and lower extremities bilaterally Skin: Right lower extremity cellulitis improving Psych: Normal affect and demeanor, alert and oriented x3    Data Reviewed:  I have personally reviewed following labs and imaging studies   CBC Lab Results  Component Value Date   WBC 7.7 11/16/2022   RBC 4.63 11/16/2022   HGB 14.2 11/16/2022   HCT 42.7 11/16/2022   MCV 92.2 11/16/2022   MCH 30.7 11/16/2022   PLT 243 11/16/2022   MCHC 33.3 11/16/2022   RDW 13.0 11/16/2022   LYMPHSABS 1.1 11/16/2022   MONOABS 1.1 (H) 11/16/2022   EOSABS 0.2 11/16/2022   BASOSABS 0.0 11/16/2022     Last metabolic panel Lab Results  Component Value Date   NA 135 11/16/2022   K 3.5 11/16/2022   CL 102 11/16/2022   CO2 24 11/16/2022   BUN 19 11/16/2022   CREATININE 1.11 11/16/2022   GLUCOSE 143 (H) 11/16/2022   GFRNONAA >60 11/16/2022   GFRAA >60 09/15/2018   CALCIUM 9.2 11/16/2022   PHOS 3.5 11/14/2022   PROT 6.6 11/13/2022   ALBUMIN 3.9 11/13/2022   BILITOT 0.5 11/13/2022   ALKPHOS 61 11/13/2022   AST 23 11/13/2022   ALT 26 11/13/2022   ANIONGAP 9 11/16/2022    CBG (last 3)  Recent Labs    11/14/22 2146  11/15/22 0736 11/15/22 1149  GLUCAP 170* 144* 101*  Coagulation Profile: No results for input(s): "INR", "PROTIME" in the last 168 hours.   Radiology Studies: No results found.     Kathlen Mody M.D. [facility] 11/16/2022, 5:57 PM  Available via Epic secure chat 7am-7pm After 7 pm, please refer to night coverage provider listed on amion.

## 2022-11-17 ENCOUNTER — Inpatient Hospital Stay (HOSPITAL_COMMUNITY): Payer: Medicare Other

## 2022-11-17 DIAGNOSIS — I951 Orthostatic hypotension: Secondary | ICD-10-CM | POA: Diagnosis not present

## 2022-11-17 DIAGNOSIS — Z515 Encounter for palliative care: Secondary | ICD-10-CM | POA: Diagnosis not present

## 2022-11-17 DIAGNOSIS — I482 Chronic atrial fibrillation, unspecified: Secondary | ICD-10-CM | POA: Diagnosis not present

## 2022-11-17 DIAGNOSIS — Z7189 Other specified counseling: Secondary | ICD-10-CM | POA: Diagnosis not present

## 2022-11-17 DIAGNOSIS — L03115 Cellulitis of right lower limb: Secondary | ICD-10-CM | POA: Diagnosis not present

## 2022-11-17 DIAGNOSIS — Z66 Do not resuscitate: Secondary | ICD-10-CM | POA: Diagnosis not present

## 2022-11-17 LAB — BASIC METABOLIC PANEL
Anion gap: 16 — ABNORMAL HIGH (ref 5–15)
BUN: 23 mg/dL (ref 8–23)
CO2: 24 mmol/L (ref 22–32)
Calcium: 9.5 mg/dL (ref 8.9–10.3)
Chloride: 99 mmol/L (ref 98–111)
Creatinine, Ser: 1.2 mg/dL (ref 0.61–1.24)
GFR, Estimated: 60 mL/min — ABNORMAL LOW (ref >60)
Glucose, Bld: 173 mg/dL — ABNORMAL HIGH (ref 70–99)
Potassium: 4 mmol/L (ref 3.5–5.1)
Sodium: 139 mmol/L (ref 135–145)

## 2022-11-17 LAB — CBC WITH DIFFERENTIAL/PLATELET
Abs Immature Granulocytes: 0.03 10*3/uL (ref 0.00–0.07)
Basophils Absolute: 0 10*3/uL (ref 0.0–0.1)
Basophils Relative: 0 %
Eosinophils Absolute: 0 10*3/uL (ref 0.0–0.5)
Eosinophils Relative: 0 %
HCT: 43.9 % (ref 39.0–52.0)
Hemoglobin: 14.4 g/dL (ref 13.0–17.0)
Immature Granulocytes: 0 %
Lymphocytes Relative: 7 %
Lymphs Abs: 0.7 10*3/uL (ref 0.7–4.0)
MCH: 30.9 pg (ref 26.0–34.0)
MCHC: 32.8 g/dL (ref 30.0–36.0)
MCV: 94.2 fL (ref 80.0–100.0)
Monocytes Absolute: 1.5 10*3/uL — ABNORMAL HIGH (ref 0.1–1.0)
Monocytes Relative: 14 %
Neutro Abs: 8.5 10*3/uL — ABNORMAL HIGH (ref 1.7–7.7)
Neutrophils Relative %: 79 %
Platelets: 215 10*3/uL (ref 150–400)
RBC: 4.66 MIL/uL (ref 4.22–5.81)
RDW: 12.9 % (ref 11.5–15.5)
WBC: 10.8 10*3/uL — ABNORMAL HIGH (ref 4.0–10.5)
nRBC: 0 % (ref 0.0–0.2)

## 2022-11-17 MED ORDER — LACTATED RINGERS IV BOLUS
500.0000 mL | Freq: Once | INTRAVENOUS | Status: AC
  Administered 2022-11-17: 500 mL via INTRAVENOUS

## 2022-11-17 MED ORDER — LACTATED RINGERS IV BOLUS: 500.0000 mL | Freq: Once | INTRAVENOUS | Status: DC

## 2022-11-17 MED ORDER — DICLOFENAC SODIUM 1 % EX GEL
4.0000 g | Freq: Four times a day (QID) | CUTANEOUS | Status: DC
  Administered 2022-11-17 – 2022-11-22 (×19): 4 g via TOPICAL
  Filled 2022-11-17 (×2): qty 100

## 2022-11-17 NOTE — Plan of Care (Signed)
  Problem: Clinical Measurements: Goal: Will remain free from infection Outcome: Progressing Goal: Respiratory complications will improve Outcome: Progressing   Problem: Pain Managment: Goal: General experience of comfort will improve Outcome: Progressing   Problem: Education: Goal: Knowledge of General Education information will improve Description: Including pain rating scale, medication(s)/side effects and non-pharmacologic comfort measures Outcome: Not Progressing   Problem: Health Behavior/Discharge Planning: Goal: Ability to manage health-related needs will improve Outcome: Not Progressing   Problem: Activity: Goal: Risk for activity intolerance will decrease Outcome: Not Progressing   Problem: Nutrition: Goal: Adequate nutrition will be maintained Outcome: Not Progressing

## 2022-11-17 NOTE — Progress Notes (Signed)
Mobility Specialist: Progress Note   11/17/22 1622  Mobility  Activity Transferred from chair to bed  Level of Assistance Maximum assist, patient does 25-49% (+2)  Assistive Device Front wheel walker  Activity Response Tolerated well  Mobility Referral Yes  $Mobility charge 1 Mobility  Mobility Specialist Start Time (ACUTE ONLY) 1200  Mobility Specialist Stop Time (ACUTE ONLY) 1215  Mobility Specialist Time Calculation (min) (ACUTE ONLY) 15 min    MS assisted RN with transferring pt back to bed. Max+2 for STS, ModA +2 for ambulation, MaxA +2 for bed mobility. Had c/o RUE pain. Left in bed with all needs met. RN in room.   Maurene Capes Mobility Specialist Please contact via SecureChat or Rehab office at [phone]

## 2022-11-17 NOTE — Consult Note (Signed)
Consultation Note Date: 11/17/2022   Patient Name: [name] [name]  DOB: 03-Jan-1941  MRN: [id number]  Age / Sex: 82 y.o., male  PCP: [name], NP Referring Physician: Kathlen Mody, MD  Reason for Consultation: Establishing goals of care  HPI/Patient Profile: 82 y.o. male  with past medical history of Alzheimer's dementia, A-fib on AC, BLE venous insufficiency, diastolic heart failure, CAD, CKD stage III, HTN, HLD, and OSA admitted on 11/13/2022 with worsening BLE swelling worse in the RLE X 2-3 weeks.  Patient diagnosed with lower extremity cellulitis in the setting of chronic venous stasis dermatitis and venous insufficiency.  Started on IV antibiotics.  Also found to have acute on chronic heart failure.  Patient's mental status and functional ability is worsened throughout hospitalization.  PMT consulted to discuss goals of care.  Clinical Assessment and Goals of Care: I have reviewed medical records including EPIC notes, labs and imaging, received report from RN, assessed the patient and then met with son and daughter  to discuss diagnosis prognosis, GOC, EOL wishes, disposition and options.  I introduced Palliative Medicine as specialized medical care for people living with serious illness. It focuses on providing relief from the symptoms and stress of a serious illness. The goal is to improve quality of life for both the patient and the family.  As far as functional and nutritional status family shares patient was doing well prior to hospitalization.  He walked in his driveway several times a day.  Able to independently complete ADLs.  Maintained good appetite.  They do speak of his dementia and moderate forgetfulness however he can typically remember most things he was told from day-to-day.   We discussed patient's current illness and what it means in the larger context of patient's on-going co-morbidities.  Natural disease trajectory and  expectations at EOL were discussed.  We discussed his presenting diagnosis of cellulitis as well as his heart failure exacerbation.  We discussed his altered mental status that has worsened throughout hospitalization.  We discussed pending CT and MRI.  I attempted to elicit values and goals of care important to the patient.    The difference between aggressive medical intervention and comfort care was considered in light of the patient's goals of care.   At this point family is hopeful for more answers to explain current situation.  They are interested in continuing full workup.  We discussed CODE STATUS and both daughter and son agreed patient would want to be DNR however they would except intubation if offered.  Discussed with family the importance of continued conversation with family and the medical providers regarding overall plan of care and treatment options, ensuring decisions are within the context of the patient's values and GOCs.    Questions and concerns were addressed. The family was encouraged to call with questions or concerns.  Primary Decision Maker NEXT OF KIN -son and daughter    SUMMARY OF RECOMMENDATIONS   -CODE STATUS changed to DNR, intubation okay -Family understands seriousness of situation and awaiting further workup -PMT will continue goals of care discussions with family.   Code Status/Advance Care Planning: DNR - pre arrest interventions desired      Primary Diagnoses: Present on Admission:  Cellulitis   I have reviewed the medical record, interviewed the patient and family, and examined the patient. The following aspects are pertinent.  Past Medical History:  Diagnosis Date   Alzheimer disease (HCC)    Chronic kidney disease    mild insuffiency   Coronary  artery disease    LHC 4/10: Mid LAD 40-50%, then 70%, OM1 20-30%, EF 65%. Mid LAD FFR 0.89 (not hemodynamically significant). Medical therapy was continued.   Dysuria 08/10/2020    Hyperlipidemia    LBBB (left bundle branch block) 06/2018   Obstructive sleep apnea    Social History   Socioeconomic History   Marital status: Widowed    Spouse name: Not on file   Number of children: 2   Years of education: Not on file   Highest education level: Not on file  Occupational History   Occupation: [employer]  Tobacco Use   Smoking status: Former    Types: Pipe    Quit date: 02/28/1990    Years since quitting: 32.7   Smokeless tobacco: Never   Tobacco comments:    smoked pipe only   Vaping Use   Vaping status: Never Used  Substance and Sexual Activity   Alcohol use: No    Alcohol/week: 0.0 standard drinks of alcohol    Comment: rarely wine   Drug use: No   Sexual activity: Not on file  Other Topics Concern   Not on file  Social History Narrative   Not on file   Social Determinants of Health   Financial Resource Strain: Low Risk  (09/27/2022)   Received from [facility]   Overall Financial Resource Strain (CARDIA)    Difficulty of Paying Living Expenses: Not hard at all  Food Insecurity: No Food Insecurity (11/14/2022)   Hunger Vital Sign    Worried About Running Out of Food in the Last Year: Never true    Ran Out of Food in the Last Year: Never true  Transportation Needs: No Transportation Needs (11/14/2022)   PRAPARE - Administrator, Civil Service (Medical): No    Lack of Transportation (Non-Medical): No  Physical Activity: Insufficiently Active (09/27/2022)   [facility]   Exercise Vital Sign    Days of Exercise per Week: 7 days    Minutes of Exercise per Session: 10 min  Stress: No Stress Concern Present (09/27/2022)   Received from [facility] - Occupational Stress Questionnaire    Feeling of Stress : Not at all  Social Connections: Socially Integrated (09/27/2022)   Received from [facility]   Social Network    How would you rate your social network (family,  work, friends)?: Good participation with social networks   Family History  Problem Relation Age of Onset   Cancer Mother    Hypertension Mother    Other Father        old age   Hypertension Father    Scheduled Meds:  apixaban  5 mg Oral BID   donepezil  10 mg Oral QHS   fesoterodine  8 mg Oral Daily   mirabegron ER  50 mg Oral q morning   nitrofurantoin (macrocrystal-monohydrate)  100 mg Oral q AM   rosuvastatin  5 mg Oral QODAY   Continuous Infusions:   ceFAZolin (ANCEF) IV 2 g (11/17/22 1353)   PRN Meds:.acetaminophen Allergies  Allergen Reactions   Enablex [Darifenacin Hydrobromide Er]     PT STATES IT MAKES HIM "BONKERS" AND VERY TIRED   Atorvastatin Other (See Comments)    Muscle aches/weakness  Other reaction(s): Other (See Comments)  Muscle aches/weakness  Muscle aches/weakness  Muscle aches/weakness  Muscle aches/weakness  Muscle aches/weakness  Muscle aches/weakness   Ceftriaxone Other (See Comments)    Unknown  reaction  Other reaction(s): Other (See Comments)  Unknown reaction  Unknown reaction    Unknown reaction  Unknown reaction  Unknown reaction   Ezetimibe Other (See Comments)    Muscle aches/weakness  Other reaction(s): Other (See Comments)  Muscle aches/weakness  Muscle aches/weakness  Muscle aches/weakness   Levaquin [Levofloxacin] Diarrhea   Metoprolol Rash   Oxybutynin Other (See Comments)    Memory impairment, temperament changes   Sulfamethoxazole Other (See Comments)    Childhood allergy   Sulfasalazine Other (See Comments)    Childhood allergy   Sulfonamide Derivatives Other (See Comments)    Childhood allergy   Review of Systems  Unable to perform ROS: Mental status change    Physical Exam Constitutional:      General: He is not in acute distress.    Appearance: He is ill-appearing.     Comments: lethargic  Pulmonary:     Effort: Pulmonary effort is normal.  Skin:    General: Skin is warm and dry.     Vital  Signs: BP 124/76   Pulse 80   Temp 98.4 F (36.9 C) (Oral)   Resp 16   Ht 5\' 10"  (1.778 m)   Wt 91 kg   SpO2 97%   BMI 28.79 kg/m  Pain Scale: 0-10 POSS *See Group Information*: S-Acceptable,Sleep, easy to arouse Pain Score: 0-No pain   SpO2: SpO2: 97 % O2 Device:SpO2: 97 % O2 Flow Rate: .   IO: Intake/output summary:  Intake/Output Summary (Last 24 hours) at 11/17/2022 1634 Last data filed at 11/17/2022 0423 Gross per 24 hour  Intake --  Output 500 ml  Net -500 ml    LBM: Last BM Date : 11/14/22 Baseline Weight: Weight: 95.3 kg Most recent weight: Weight: 91 kg     Palliative Assessment/Data: PPS 40%     *Please note that this is a verbal dictation therefore any spelling or grammatical errors are due to the "[employer]agon [employer] One" system interpretation.   Time Total: 90 minutes Time spent includes: Detailed review of medical records (labs, imaging, vital signs), medically appropriate exam, discussion with treatment team, counseling and educating patient, family and/or staff, documenting clinical information, medication management and coordination of care.    Gerlean Ren, DNP, AGNP-C Palliative Medicine Team [phone] Pager: [phone]

## 2022-11-17 NOTE — Progress Notes (Signed)
   11/17/22 1959  Assess: MEWS Score  Temp (!) 100.8 F (38.2 C)  BP 134/82  MAP (mmHg) 96  Pulse Rate 100  Resp 19  SpO2 96 %  O2 Device Room Air  Assess: MEWS Score  MEWS Temp 1  MEWS Systolic 0  MEWS Pulse 0  MEWS RR 0  MEWS LOC 0  MEWS Score 1  MEWS Score Color Green  Provider Notification  Provider Name/Title C. Hall,MD  Date Provider Notified 11/17/22  Time Provider Notified 2103  Method of Notification  (secure chat)  Notification Reason Other (Comment) (T 100.9)  Provider response No new orders  Date of Provider Response 11/17/22  Time of Provider Response 2116  Assess: SIRS CRITERIA  SIRS Temperature  0  SIRS Pulse 1  SIRS Respirations  0  SIRS WBC 0  SIRS Score Sum  1

## 2022-11-17 NOTE — Progress Notes (Signed)
[facility]                                                                               [name] [name], is a 82 y.o. male, DOB - Jul 17, 1940, [id number] Admit date - 11/13/2022    Outpatient Primary MD for the patient is [name] Negus, NP  LOS - 3  days    Brief summary   [name] [name] is a 82 y.o. male with hx of Alzheimer's dementia, A-fib on AC, BLE venous insufficiency, diastolic heart failure, CAD, CKD stage III, HTN, HLD, OSA, who presented with worsening BLE swelling worse in the RLE X 2-3 weeks. He had been seen by PCP, placed on clindamycin without improvement in his symptoms. Denies any associated fevers.  Reports compliance with home Lasix and apixaban.  Reported to be chronically on doxycycline and Macrobid.    Assessment & Plan    Assessment and Plan:  Nonpurulent lower extremity cellulitis on the right in the setting of chronic venous stasis dermatitis/venous insufficiency Bilateral lower extremity Dopplers negative for DVT. Blood cultures have been negative so far. He was started on IV ancef, d/c after tomorrow's dose.     Mild acute on chronic diastolic heart failure Elevated BNP on admission and with bilateral lower extremity edema. Echocardiogram showed preserved left ventricular ejection fraction with no regional wall motion abnormalities, has grade 1 diastolic dysfunction.  Received IV Lasix with good diuresis. Stopped the lasix, as he became hypotensive with therapy today.  Continue with strict intake and output and daily weights    Stage IIIa CKD Creatinine appears to be at baseline.    Paroxysmal atrial fibrillation Currently not in any rate controlling agents.  Patient is on Eliquis continue the same.   Essential hypertension Hypotensive today with PT.  D/c lasix and losartan.  Bolus of RL, with improvement in the BP parameters.    Lethargy this morning, acute encephalopathy, suspect from orthostatic hypotension .  His  mental status improved once BP improved.  Ordered CT head without contrast further evaluation. Discussed with Radiology, no signs of acute stroke or hemorrhage on CT.  Further evaluation with MRI brain without contrast ordered.  Discussed the plan with the daughter.   Alzheimer's dementia:  Continue with aricept.   In view of his progressive decline, multiple medical issues, palliative care consulted for goals of care.    Estimated body mass index is 28.79 kg/m as calculated from the following:   Height as of this encounter: 5\' 10"  (1.778 m).   Weight as of this encounter: 91 kg.  Code Status: DNR- intervention.  No CPR or chest compressions, . May intubate use advanced airway interventions and cardioversion/ACLS medications if appropriate or indicated. May transfer to ICU.  DVT Prophylaxis:   apixaban (ELIQUIS) tablet 5 mg   Level of Care: Level of care: Med-Surg Family Communication: None at bedside  Disposition Plan:     Remains inpatient appropriate: SNF,   Procedures:  MRI brain without contrast   Consultants:   Palliative care.   Antimicrobials:   Anti-infectives (From admission, onward)    Start     Dose/Rate Route Frequency Ordered Stop   11/15/22 0700  nitrofurantoin (macrocrystal-monohydrate) (MACROBID) capsule 100 mg        100 mg Oral Every morning 11/14/22 1428     11/14/22 1430  ceFAZolin (ANCEF) IVPB 2g/100 mL premix        2 g 200 mL/hr over 30 Minutes Intravenous Every 8 hours 11/14/22 1339     11/14/22 0800  doxycycline (VIBRA-TABS) tablet 100 mg  Status:  Discontinued        100 mg Oral Every 24 hours 11/14/22 0317 11/14/22 1339   11/14/22 0230  vancomycin (VANCOCIN) IVPB 1000 mg/200 mL premix  Status:  Discontinued       Placed in "Followed by" Linked Group   1,000 mg 200 mL/hr over 60 Minutes Intravenous  Once 11/14/22 0112 11/14/22 1339   11/14/22 0115  vancomycin (VANCOCIN) IVPB 1000 mg/200 mL premix       Placed in "Followed by" Linked Group    1,000 mg 200 mL/hr over 60 Minutes Intravenous  Once 11/14/22 0112 11/14/22 0700   11/13/22 2315  clindamycin (CLEOCIN) IVPB 600 mg        600 mg 100 mL/hr over 30 Minutes Intravenous  Once 11/13/22 2314 11/14/22 0037        Medications  Scheduled Meds:  apixaban  5 mg Oral BID   donepezil  10 mg Oral QHS   fesoterodine  8 mg Oral Daily   mirabegron ER  50 mg Oral q morning   nitrofurantoin (macrocrystal-monohydrate)  100 mg Oral q AM   rosuvastatin  5 mg Oral QODAY   Continuous Infusions:   ceFAZolin (ANCEF) IV 2 g (11/17/22 1353)   PRN Meds:.acetaminophen    Subjective:   [name] [name] was seen and examined today.  No new complaints.   Objective:   Vitals:   11/16/22 2056 11/17/22 0736 11/17/22 1127 11/17/22 1200  BP: (!) 149/77 (!) 142/65 (!) 76/46 124/76  Pulse: (!) 102 80    Resp: 17 16    Temp: 100 F (37.8 C) 98.4 F (36.9 C)    TempSrc: Oral Oral    SpO2: 98% 97%    Weight:      Height:        Intake/Output Summary (Last 24 hours) at 11/17/2022 1710 Last data filed at 11/17/2022 0423 Gross per 24 hour  Intake --  Output 500 ml  Net -500 ml   Filed Weights   11/14/22 2300 11/15/22 0105 11/16/22 0500  Weight: 89.2 kg 89.2 kg 91 kg     Exam General exam: ill appearing gentleman, not in distress.  Respiratory system: Clear to auscultation. Respiratory effort normal. Cardiovascular system: S1 & S2 heard, RRR.  Gastrointestinal system: Abdomen is nondistended, soft and nontender.  Central nervous system: Alert and oriented to person only.  Extremities: chronic venous stasis changes.  Skin: No rashes, Psychiatry: unable to assess.    Data Reviewed:  I have personally reviewed following labs and imaging studies   CBC Lab Results  Component Value Date   WBC 7.7 11/16/2022   RBC 4.63 11/16/2022   HGB 14.2 11/16/2022   HCT 42.7 11/16/2022   MCV 92.2 11/16/2022   MCH 30.7 11/16/2022   PLT 243 11/16/2022   MCHC 33.3 11/16/2022   RDW 13.0  11/16/2022   LYMPHSABS 1.1 11/16/2022   MONOABS 1.1 (H) 11/16/2022   EOSABS 0.2 11/16/2022   BASOSABS 0.0 11/16/2022     Last metabolic panel Lab Results  Component Value Date   NA 135 11/16/2022   K 3.5 11/16/2022  CL 102 11/16/2022   CO2 24 11/16/2022   BUN 19 11/16/2022   CREATININE 1.11 11/16/2022   GLUCOSE 143 (H) 11/16/2022   GFRNONAA >60 11/16/2022   GFRAA >60 09/15/2018   CALCIUM 9.2 11/16/2022   PHOS 3.5 11/14/2022   PROT 6.6 11/13/2022   ALBUMIN 3.9 11/13/2022   BILITOT 0.5 11/13/2022   ALKPHOS 61 11/13/2022   AST 23 11/13/2022   ALT 26 11/13/2022   ANIONGAP 9 11/16/2022    CBG (last 3)  Recent Labs    11/14/22 2146 11/15/22 0736 11/15/22 1149  GLUCAP 170* 144* 101*      Coagulation Profile: No results for input(s): "INR", "PROTIME" in the last 168 hours.   Radiology Studies: No results found.     Kathlen Mody M.D. [facility] 11/17/2022, 5:10 PM  Available via Epic secure chat 7am-7pm After 7 pm, please refer to night coverage provider listed on amion.

## 2022-11-17 NOTE — Plan of Care (Signed)
Problem: Education: Goal: Knowledge of General Education information will improve Description Including pain rating scale, medication(s)/side effects and non-pharmacologic comfort measures Outcome: Progressing   Problem: Health Behavior/Discharge Planning: Goal: Ability to manage health-related needs will improve Outcome: Progressing   Problem: Clinical Measurements: Goal: Ability to maintain clinical measurements within normal limits will improve Outcome: Progressing   Problem: Activity: Goal: Risk for activity intolerance will decrease Outcome: Progressing   Problem: Pain Managment: Goal: General experience of comfort will improve Outcome: Progressing   Problem: Safety: Goal: Ability to remain free from injury will improve Outcome: Progressing

## 2022-11-17 NOTE — Progress Notes (Signed)
Occupational Therapy Treatment Patient Details Name: [name] [name] MRN: [id number] DOB: 11-09-1940 Today's Date: 11/17/2022   History of present illness Pt is an 82 y/o M presenting to ED on 9/15 with increased RLE edema, BLE ultrasound negative for DVT. Admitted for RLE cellulitis. PMH includes dementia, A fib on anticoagulation, venous insufficiency in BLE, diastolic heart failure, CAD, CKD III, HTN, HLD, OSA   OT comments  Patient is not progressing as hoped as evidenced by a decreased score on 6-Clicks AM-PAC measure of occupational Performance with previous score of 17/24, and current score of 10/24 which seems linked to pt's orthostatic hypotension with symptoms of being unresponsive and not following any commends, as well as increased RUE pain and pt guarding with all ADLs and mobility. Patient remains limited by baseline dementia,  generalized weakness and decreased activity tolerance with BP of 76/46 after ambulation attempt to bathroom, recovering to 97/49 after assisted to recliner with feet up and head down, along with deficits noted below. Pt continues to demonstrate fair rehab potential and would benefit from continued skilled OT to increase safety and independence with ADLs and functional transfers to allow pt to return home safely and reduce caregiver burden and fall risk. Have updated pt's discharge rec to skilled nursing facility based on pt's current needs today.       If plan is discharge home, recommend the following:  A little help with walking and/or transfers;A lot of help with bathing/dressing/bathroom;Assistance with cooking/housework;Direct supervision/assist for financial management;Direct supervision/assist for medications management;Assist for transportation   Equipment Recommendations       Recommendations for Other Services      Precautions / Restrictions Precautions Precautions: Fall Precaution Comments: Mon BP! Restrictions Other Position/Activity  Restrictions: RUE pain with movement.       Mobility Bed Mobility               General bed mobility comments: Pt at EOB with RN    Transfers                         Balance Overall balance assessment: Needs assistance Sitting-balance support: Feet supported Sitting balance-Leahy Scale: Fair     Standing balance support: Bilateral upper extremity supported, Reliant on assistive device for balance Standing balance-Leahy Scale: Poor                             ADL either performed or assessed with clinical judgement   ADL Overall ADL's : Needs assistance/impaired Eating/Feeding: Minimal assistance;Moderate assistance;Sitting;Cueing for sequencing Eating/Feeding Details (indicate cue type and reason): Pt required full setup and bagel placed in LT hand as pt guarding RT. Pt then able to feed self while seated in recliner. Grooming: Minimal assistance;Moderate assistance;Sitting;Cueing for sequencing Grooming Details (indicate cue type and reason): Pt washed face with Mod As for thoroughness using LThand only. Pt assisted with washing LT hand to prepare for meal.         Upper Body Dressing : Moderate assistance;Cueing for sequencing;Cueing for safety;Sitting Upper Body Dressing Details (indicate cue type and reason): Mod As needed in recliner due to pt guarding RUE wioth poor tolerance to PROM.     Toilet Transfer: Minimal assistance;Moderate assistance;Maximal assistance;Cueing for safety;Cueing for sequencing;Rolling walker (2 wheels);Ambulation;+2 for physical assistance Toilet Transfer Details (indicate cue type and reason): Pt stood from elevated EOB to RW with Mod As and second person for safety and to guide RT  hand only RW with need of significantly increased time for pt to respond.  Pt ambulated very slowly with constant cues and Mod As ~8-10' then pt stopped following commands and stopped verbalizations. RN assisted with bringing recliner begind  pt and pt required Max As of 2 to sit, eyes open but non-re3sponsive and following no commands.   Toileting - Clothing Manipulation Details (indicate cue type and reason): Pt was incontinent of urine during bed mobility or already wet. .     Functional mobility during ADLs: Moderate assistance;Maximal assistance;+2 for physical assistance;Rolling walker (2 wheels);Cueing for sequencing;Cueing for safety      Extremity/Trunk Assessment Upper Extremity Assessment RUE Deficits / Details: limited shoulder flexion, guarding any RT UE movement. Not making a fist to grip RW, ut rather resting hand on top of it.            Vision       Perception     Praxis      Cognition Arousal: Alert Behavior During Therapy: WFL for tasks assessed/performed Overall Cognitive Status: History of cognitive impairments - at baseline                                 General Comments: hx of dementia: Oriented to self only. Able to state that he lives in [place].        Exercises      Shoulder Instructions       General Comments      Pertinent Vitals/ Pain       Pain Assessment Pain Assessment: Faces Faces Pain Scale: Hurts even more Pain Location: R shoulder/UE with all movement. Pain Descriptors / Indicators: Guarding, Discomfort, Grimacing, Moaning Pain Intervention(s): Repositioned, Limited activity within patient's tolerance, Monitored during session  Home Living                                          Prior Functioning/Environment              Frequency  Min 1X/week        Progress Toward Goals  OT Goals(current goals can now be found in the care plan section)  Progress towards OT goals: Not progressing toward goals - comment  Acute Rehab OT Goals OT Goal Formulation: Patient unable to participate in goal setting Time For Goal Achievement: 11/29/22 Potential to Achieve Goals: Fair  Plan      Co-evaluation                  AM-PAC OT "6 Clicks" Daily Activity     Outcome Measure   Help from another person eating meals?: A Lot Help from another person taking care of personal grooming?: A Lot Help from another person toileting, which includes using toliet, bedpan, or urinal?: Total Help from another person bathing (including washing, rinsing, drying)?: A Lot Help from another person to put on and taking off regular upper body clothing?: A Lot Help from another person to put on and taking off regular lower body clothing?: Total 6 Click Score: 10    End of Session Equipment Utilized During Treatment: Gait belt;Rolling walker (2 wheels)      Activity Tolerance Treatment limited secondary to medical complications (Comment);Patient limited by pain   Patient Left with nursing/sitter in room;with chair alarm set;with call bell/phone within reach;in chair  Nurse Communication Other (comment) (RN and MD in room for majority of session)        Time: [phone] OT Time Calculation (min): 29 min  Charges: OT General Charges $OT Visit: 1 Visit OT Treatments $Self Care/Home Management : 8-22 mins $Therapeutic Activity: 8-22 mins  Victorino Dike, OT Acute Rehab Services Office: [phone] 11/17/2022   Theodoro Clock 11/17/2022, 11:40 AM

## 2022-11-17 NOTE — Care Management Important Message (Signed)
Important Message  Patient Details  Name: [name] [name] MRN: [id number] Date of Birth: 09/09/1940   Medicare Important Message Given:  Yes     Sherilyn Banker 11/17/2022, 3:35 PM

## 2022-11-18 ENCOUNTER — Inpatient Hospital Stay (HOSPITAL_COMMUNITY): Payer: Medicare Other

## 2022-11-18 DIAGNOSIS — L03115 Cellulitis of right lower limb: Secondary | ICD-10-CM | POA: Diagnosis not present

## 2022-11-18 DIAGNOSIS — I739 Peripheral vascular disease, unspecified: Secondary | ICD-10-CM | POA: Diagnosis not present

## 2022-11-18 DIAGNOSIS — I482 Chronic atrial fibrillation, unspecified: Secondary | ICD-10-CM | POA: Diagnosis not present

## 2022-11-18 DIAGNOSIS — I951 Orthostatic hypotension: Secondary | ICD-10-CM | POA: Diagnosis not present

## 2022-11-18 DIAGNOSIS — R4182 Altered mental status, unspecified: Secondary | ICD-10-CM | POA: Diagnosis not present

## 2022-11-18 DIAGNOSIS — Z515 Encounter for palliative care: Secondary | ICD-10-CM | POA: Diagnosis not present

## 2022-11-18 DIAGNOSIS — Z7189 Other specified counseling: Secondary | ICD-10-CM | POA: Diagnosis not present

## 2022-11-18 LAB — BASIC METABOLIC PANEL
Anion gap: 12 (ref 5–15)
BUN: 26 mg/dL — ABNORMAL HIGH (ref 8–23)
CO2: 23 mmol/L (ref 22–32)
Calcium: 9.3 mg/dL (ref 8.9–10.3)
Chloride: 99 mmol/L (ref 98–111)
Creatinine, Ser: 1.19 mg/dL (ref 0.61–1.24)
GFR, Estimated: >60 mL/min (ref >60)
Glucose, Bld: 166 mg/dL — ABNORMAL HIGH (ref 70–99)
Potassium: 3.9 mmol/L (ref 3.5–5.1)
Sodium: 134 mmol/L — ABNORMAL LOW (ref 135–145)

## 2022-11-18 LAB — CBC WITH DIFFERENTIAL/PLATELET
Abs Immature Granulocytes: 0.03 10*3/uL (ref 0.00–0.07)
Basophils Absolute: 0 10*3/uL (ref 0.0–0.1)
Basophils Relative: 1 %
Eosinophils Absolute: 0.1 10*3/uL (ref 0.0–0.5)
Eosinophils Relative: 1 %
HCT: 41.1 % (ref 39.0–52.0)
Hemoglobin: 13.9 g/dL (ref 13.0–17.0)
Immature Granulocytes: 0 %
Lymphocytes Relative: 10 %
Lymphs Abs: 0.8 10*3/uL (ref 0.7–4.0)
MCH: 32.1 pg (ref 26.0–34.0)
MCHC: 33.8 g/dL (ref 30.0–36.0)
MCV: 94.9 fL (ref 80.0–100.0)
Monocytes Absolute: 1 10*3/uL (ref 0.1–1.0)
Monocytes Relative: 12 %
Neutro Abs: 6.8 10*3/uL (ref 1.7–7.7)
Neutrophils Relative %: 76 %
Platelets: 253 10*3/uL (ref 150–400)
RBC: 4.33 MIL/uL (ref 4.22–5.81)
RDW: 13 % (ref 11.5–15.5)
WBC: 8.8 10*3/uL (ref 4.0–10.5)
nRBC: 0 % (ref 0.0–0.2)

## 2022-11-18 LAB — LACTIC ACID, PLASMA: Lactic Acid, Venous: 1.7 mmol/L (ref 0.5–1.9)

## 2022-11-18 MED ORDER — METHOCARBAMOL 500 MG PO TABS
500.0000 mg | ORAL_TABLET | Freq: Three times a day (TID) | ORAL | Status: DC | PRN
  Administered 2022-11-18 – 2022-11-20 (×2): 500 mg via ORAL
  Filled 2022-11-18 (×2): qty 1

## 2022-11-18 MED ORDER — KETOROLAC TROMETHAMINE 15 MG/ML IJ SOLN: 15.0000 mg | Freq: Three times a day (TID) | INTRAMUSCULAR | Status: AC | PRN

## 2022-11-18 MED ORDER — TRAMADOL HCL 50 MG PO TABS
50.0000 mg | ORAL_TABLET | Freq: Two times a day (BID) | ORAL | Status: AC | PRN
  Administered 2022-11-20: 50 mg via ORAL
  Filled 2022-11-18: qty 1

## 2022-11-18 NOTE — NC FL2 (Signed)
[place] MEDICAID FL2 LEVEL OF CARE FORM     IDENTIFICATION  Patient Name: [name] [name] Birthdate: 1940-12-14 Sex: male Admission Date (Current Location): 11/13/2022  [facility] and [place] Number:  Producer, television/film/video and Address:  [facility] [place], [address] [place], [place] [address]      Provider Number: [id number]  Attending Physician Name and Address:  Kathlen Mody, MD  Relative Name and Phone Number:  [facility] Daughter [phone]  [phone]    Current Level of Care: Hospital Recommended Level of Care: Skilled Nursing Facility Prior Approval Number:    Date Approved/Denied:   PASRR Number: [id number]  Discharge Plan: SNF    Current Diagnoses: Patient Active Problem List   Diagnosis Date Noted   Asymptomatic bacteriuria 04/06/2021   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/03/2021   Lactic acidosis 04/02/2021   Paroxysmal atrial fibrillation (HCC) 04/02/2021   SIRS (systemic inflammatory response syndrome) (HCC) 04/02/2021   Acute respiratory distress 04/02/2021   Dementia with behavioral disturbance (HCC) 04/02/2021   Sepsis (HCC) 01/08/2021   Neck infection 01/08/2021   Encephalopathy due to COVID-19 virus 01/08/2021   Transaminitis 01/08/2021   Sepsis secondary to UTI (HCC) 09/11/2020   Dysuria 08/10/2020   Acute parotitis 07/27/2020   Facial cellulitis 07/26/2020   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   Primary osteoarthritis of left hip 09/06/2018   Alzheimer's dementia (HCC) 12/07/2016   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   Venous stasis dermatitis of left lower extremity 04/07/2016   Bilateral impacted cerumen 01/18/2016   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   Unsteadiness 01/18/2016   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   Cellulitis of left lower leg    Left leg swelling    Cellulitis 08/23/2014   Chronic diastolic CHF-EF noted  to be 60- 65% ( grade 1 diastolic dysfunction by TTE in 04/2020) 04/29/2014   DOE (dyspnea on exertion) 11/12/2011   Stage 3a chronic kidney disease (HCC)    Thrombocytopenia, unspecified (HCC) 07/29/2010   Vitamin D deficiency 05/19/2010   Depression, major, recurrent, 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   Urinary urgency 05/19/2010   Vertigo, 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   Benign essential hypertension 12/23/2009   Actinic keratosis 12/23/2009   Seborrheic keratosis 03/18/2009   Benign neoplasm 03/18/2009   Dyslipidemia 01/18/2008   OSA on CPAP 01/18/2008   Coronary atherosclerosis 01/18/2008   Atherosclerotic heart disease of native coronary artery without angina pectoris 01/18/2008   Cataract in degenerative disorder 11/21/2006   Unspecified protein-calorie malnutrition (HCC) 09/27/2006   Gastro-esophageal reflux disease with esophagitis 09/15/2006   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) 05/11/2005   CAD (nonobstructive CAD seen on LHC 2010 ) 05/11/2005    Orientation RESPIRATION BLADDER Height & Weight     Self  Normal External catheter, Incontinent Weight: 200 lb 9.9 oz (91 kg) Height:  5\' 10"  (177.8 cm)  BEHAVIORAL SYMPTOMS/MOOD NEUROLOGICAL BOWEL NUTRITION STATUS      Continent Diet (see discharge summary)  AMBULATORY STATUS COMMUNICATION OF NEEDS Skin   Total Care Verbally Other (Comment) (redness, ecchymosis)                       Personal Care Assistance Level of Assistance  Bathing, Feeding, Dressing Bathing Assistance: Limited assistance Feeding assistance: Limited assistance Dressing Assistance: Limited assistance     Functional Limitations Info  Sight, Hearing, Speech Sight Info: Adequate Hearing  Info: Adequate Speech Info: Adequate    SPECIAL CARE FACTORS FREQUENCY  PT (By licensed PT), OT (By licensed OT)     PT Frequency: 5x week OT Frequency: 5x week            Contractures Contractures Info: Not present     Additional Factors Info  Code Status, Allergies Code Status Info: DNR Allergies Info: Enablex (Darifenacin Hydrobromide Er), Atorvastatin, Ceftriaxone, Ezetimibe, Levaquin (Levofloxacin), Metoprolol, Oxybutynin, Sulfamethoxazole, Sulfasalazine, Sulfonamide Derivatives           Current Medications (11/18/2022):  This is the current hospital active medication list Current Facility-Administered Medications  Medication Dose Route Frequency Provider Last Rate Last Admin   acetaminophen (TYLENOL) tablet 650 mg  650 mg Oral Q6H PRN Kathlen Mody, MD   650 mg at 11/18/22 1511   apixaban (ELIQUIS) tablet 5 mg  5 mg Oral BID Dolly Rias, MD   5 mg at 11/18/22 0802   ceFAZolin (ANCEF) IVPB 2g/100 mL premix  2 g Intravenous Q8H Kathlen Mody, MD 200 mL/hr at 11/18/22 1515 2 g at 11/18/22 1515   diclofenac Sodium (VOLTAREN) 1 % topical gel 4 g  4 g Topical QID Kathlen Mody, MD   4 g at 11/18/22 1510   donepezil (ARICEPT) tablet 10 mg  10 mg Oral QHS Dolly Rias, MD   10 mg at 11/17/22 2104   fesoterodine (TOVIAZ) tablet 8 mg  8 mg Oral Daily Briant Cedar, MD   8 mg at 11/18/22 0801   ketorolac (TORADOL) 15 MG/ML injection 15 mg  15 mg Intravenous Q8H PRN Kathlen Mody, MD       methocarbamol (ROBAXIN) tablet 500 mg  500 mg Oral Q8H PRN Kathlen Mody, MD   500 mg at 11/18/22 1511   mirabegron ER (MYRBETRIQ) tablet 50 mg  50 mg Oral q morning Briant Cedar, MD   50 mg at 11/18/22 0802   nitrofurantoin (macrocrystal-monohydrate) (MACROBID) capsule 100 mg  100 mg Oral q AM Briant Cedar, MD   100 mg at 11/18/22 0759   rosuvastatin (CRESTOR) tablet 5 mg  5 mg Oral Liliane Bade, MD   5 mg at 11/18/22 0802   traMADol (ULTRAM) tablet 50 mg  50 mg Oral Q12H PRN Kathlen Mody, MD         Discharge Medications: Please see discharge summary for a list of discharge medications.  Relevant Imaging Results:  Relevant Lab Results:   Additional Information SSN:  [id number]  Lorri Frederick, LCSW

## 2022-11-18 NOTE — Plan of Care (Signed)

## 2022-11-18 NOTE — Progress Notes (Addendum)
Daily Progress Note   Patient Name: [name] [name]       Date: 11/18/2022 DOB: [birth year]  Age: 82 y.o. MRN#: [id number] Attending Physician: Kathlen Mody, MD Primary Care Physician: [name], NP Admit Date: 11/13/2022  Reason for Consultation/Follow-up: Establishing goals of care  Subjective: Patient minimally interactive w/me, family at bedside report more interactive today than yesterday  Length of Stay: 4  Current Medications: Scheduled Meds:   apixaban  5 mg Oral BID   diclofenac Sodium  4 g Topical QID   donepezil  10 mg Oral QHS   fesoterodine  8 mg Oral Daily   mirabegron ER  50 mg Oral q morning   nitrofurantoin (macrocrystal-monohydrate)  100 mg Oral q AM   rosuvastatin  5 mg Oral QODAY    Continuous Infusions:   ceFAZolin (ANCEF) IV 2 g (11/18/22 1515)    PRN Meds: acetaminophen, ketorolac, methocarbamol, traMADol  Physical Exam Constitutional:      General: He is not in acute distress.    Appearance: He is ill-appearing.     Comments: Minimally interactive - grunts to touch  Pulmonary:     Effort: Pulmonary effort is normal.  Skin:    General: Skin is warm and dry.             Vital Signs: BP 139/65 (BP Location: Left Arm)   Pulse 90   Temp 99.6 F (37.6 C) (Oral)   Resp 17   Ht 5\' 10"  (1.778 m)   Wt 91 kg   SpO2 97%   BMI 28.79 kg/m  SpO2: SpO2: 97 % O2 Device: O2 Device: Room Air O2 Flow Rate:    Intake/output summary:  Intake/Output Summary (Last 24 hours) at 11/18/2022 1554 Last data filed at 11/18/2022 1000 Gross per 24 hour  Intake 1040 ml  Output 1200 ml  Net -160 ml   LBM: Last BM Date : 11/14/22 Baseline Weight: Weight: 95.3 kg Most recent weight: Weight: 91 kg       Palliative Assessment/Data: PPS 40%      Patient Active  Problem List   Diagnosis Date Noted   Asymptomatic bacteriuria 04/06/2021   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/03/2021   Lactic acidosis 04/02/2021   Paroxysmal atrial fibrillation (HCC) 04/02/2021   SIRS (systemic inflammatory response syndrome) (HCC) 04/02/2021   Acute respiratory distress 04/02/2021   Dementia with behavioral disturbance (HCC) 04/02/2021   Sepsis (HCC) 01/08/2021   Neck infection 01/08/2021   Encephalopathy due to COVID-19 virus 01/08/2021   Transaminitis 01/08/2021   Sepsis secondary to UTI (HCC) 09/11/2020   Dysuria 08/10/2020   Acute parotitis 07/27/2020   Facial cellulitis 07/26/2020   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   Primary osteoarthritis of left hip 09/06/2018   Alzheimer's dementia (HCC) 12/07/2016   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   Venous stasis dermatitis of left lower extremity 04/07/2016   Bilateral impacted cerumen 01/18/2016   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   Unsteadiness 01/18/2016   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   Cellulitis of left lower leg    Left leg swelling    Cellulitis 08/23/2014   Chronic  diastolic CHF-EF noted to be 60- 65% ( grade 1 diastolic dysfunction by TTE in 04/2020) 04/29/2014   DOE (dyspnea on exertion) 11/12/2011   Stage 3a chronic kidney disease (HCC)    Thrombocytopenia, unspecified (HCC) 07/29/2010   Vitamin D deficiency 05/19/2010   Depression, major, recurrent, 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   Urinary urgency 05/19/2010   Vertigo, 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   Benign essential hypertension 12/23/2009   Actinic keratosis 12/23/2009   Seborrheic keratosis 03/18/2009   Benign neoplasm 03/18/2009   Dyslipidemia 01/18/2008   OSA on CPAP 01/18/2008   Coronary atherosclerosis 01/18/2008   Atherosclerotic heart disease of native coronary artery without angina pectoris 01/18/2008   Cataract in degenerative  disorder 11/21/2006   Unspecified protein-calorie malnutrition (HCC) 09/27/2006   Gastro-esophageal reflux disease with esophagitis 09/15/2006   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 male  with past medical history of Alzheimer's dementia, A-fib on AC, BLE venous insufficiency, diastolic heart failure, CAD, CKD stage III, HTN, HLD, and OSA admitted on 11/13/2022 with worsening BLE swelling worse in the RLE X 2-3 weeks.  Patient diagnosed with lower extremity cellulitis in the setting of chronic venous stasis dermatitis and venous insufficiency.  Started on IV antibiotics.  Also found to have acute on chronic heart failure.  Patient's mental status and functional ability is worsened throughout hospitalization.  PMT consulted to discuss goals of care.   Assessment: Follow up today with daughter and sister at bedside. We review imaging - MRI negative for acute cause of mental status change.  We review risks with medications, hypotension, risk for delirium that could all affect mental status. Family feels mental status has started to improve and remain hopeful for ongoing improvement.  They share they have decided to pursue short term rehab as they worry about the ability to care for him at  home in current condition.  We discuss delirium precautions.   A MOST form was introduced to daughter and encouraged to review with her brother so that we might complete before he discharges to SNF.   Recommendations/Plan: Maintain DNR, intubation okay Continue current care, allow time for outcomes Follow up MOST Delirium precautions - worry about hypoactive delirium? Standard delirium management (adapted from NICE guidelines 2011 for prevention of delirium):  Provide continuity of care when possible (avoid frequent changing of surroundings and staff).  Frequent  reorientation to time with:  A clock should always be visible.  Make sure Calendar/white board is updated.  Lights on/blinds open during the day and off/closed at night.  Encourage frequent family visits.  Monitor for and treat dehydration/constipation.  Optimize oxygen saturation.  Avoid catheters and IV's when possible and look for/treat infections.  Encourage early mobility.  Assess and treat pain  Ensure adequate nutrition and functioning dentures.  Address reversible causes of hearing and visual impairment:  Use pocket talker if hearing aids are unavailable.  Avoid sleep disturbance (normalize sleep/wake cycle).  Minimize disturbances and consider NOT obtaining vitals at night if possible.  Review Medications to avoid polypharmacy and avoid deliriogenic medications when possible:  Benzodiazepines  Dihydropyridines.  Antihistamines.  Anticholinergics   (Possibly avoid: H2 blockers, tricyclic antidepressants, antiparkinson medications, steroids, NSAID's).    Code Status: DNR  Discharge Planning: Skilled Nursing Facility for rehab with Palliative care service follow-up  Care plan was discussed with patient's daughter and  sister, RN  Thank you for allowing the Palliative Medicine Team to assist in the care of this patient.   Total Time 45 minutes Prolonged Time Billed  no   Time spent includes: Detailed review of medical records (labs, imaging, vital signs), medically appropriate exam, discussion with treatment team, counseling and educating patient, family and/or staff, documenting clinical information, medication management and coordination of care.     *Please note that this is a verbal dictation therefore any spelling or grammatical errors are due to the "Dragon [employer] One" system interpretation.  Gerlean Ren, DNP, [facility] Palliative Medicine Team Team Phone # [phone]  Pager [phone]

## 2022-11-18 NOTE — Progress Notes (Addendum)
Physical Therapy Treatment Patient Details Name: DELWIN HUNDT MRN: [id number] DOB: 1940-06-09 Today's Date: 11/18/2022   History of Present Illness Pt is an 82 y/o M presenting to ED on 9/15 with increased RLE edema, BLE ultrasound negative for DVT. Admitted for RLE cellulitis. PMH includes dementia, A fib on anticoagulation, venous insufficiency in BLE, diastolic heart failure, CAD, CKD III, HTN, HLD, OSA    PT Comments  Pt with poor tolerance to treatment today. Pt mobility status continues to rapidly deteriorate. Pt today required +2 Max A/Total A for all mobility and was unable to take steps after ambulating 137ft with PT on Tuesday. Pt also noted heavy L lateral lean when sitting EOB and standing with RW today. Pt continues to complain of increased R shoulder pain. MD made aware. DC recs updated to SNF. PT will continue to follow.    If plan is discharge home, recommend the following: Two people to help with walking and/or transfers;A lot of help with bathing/dressing/bathroom;Assistance with cooking/housework;Direct supervision/assist for medications management;Assistance with feeding;Direct supervision/assist for financial management;Assist for transportation;Help with stairs or ramp for entrance;Supervision due to cognitive status   Can travel by private vehicle     No  Equipment Recommendations  Other (comment) (Per accepting facility)    Recommendations for Other Services       Precautions / Restrictions Precautions Precautions: Fall Restrictions Weight Bearing Restrictions: No     Mobility  Bed Mobility Overal bed mobility: Needs Assistance Bed Mobility: Supine to Sit, Sit to Supine     Supine to sit: +2 for physical assistance, Max assist Sit to supine: +2 for physical assistance, Total assist   General bed mobility comments: Assistance with trunk elevation and scooting hips. Pt able move legs with increased time. +2 total A via helicopter method to return to  bed.    Transfers Overall transfer level: Needs assistance Equipment used: Rolling walker (2 wheels) Transfers: Sit to/from Stand Sit to Stand: +2 physical assistance, Max assist           General transfer comment: Increased time, multiple attempts to stand. Pt unable to take steps. Pt with a heavy L lateral lean.    Ambulation/Gait               General Gait Details: Pt unable to today.   Stairs             Wheelchair Mobility     Tilt Bed    Modified Rankin (Stroke Patients Only)       Balance Overall balance assessment: Needs assistance Sitting-balance support: Feet supported Sitting balance-Leahy Scale: Poor Sitting balance - Comments: Heavy L lateral lean today Postural control: Left lateral lean Standing balance support: Bilateral upper extremity supported, Reliant on assistive device for balance Standing balance-Leahy Scale: Poor Standing balance comment: Heavy L lateral lean upon standing                            Cognition Arousal: Alert Behavior During Therapy: WFL for tasks assessed/performed Overall Cognitive Status: History of cognitive impairments - at baseline                                 General Comments: history of dementia        Exercises      General Comments General comments (skin integrity, edema, etc.): VSS      Pertinent Vitals/Pain  Pain Assessment Pain Assessment: Faces Faces Pain Scale: Hurts whole lot Pain Location: R shoulder/UE with all movement. Pain Descriptors / Indicators: Guarding, Discomfort, Grimacing, Moaning Pain Intervention(s): Monitored during session, Limited activity within patient's tolerance, Repositioned    Home Living                          Prior Function            PT Goals (current goals can now be found in the care plan section) Progress towards PT goals: Progressing toward goals    Frequency    Min 1X/week      PT Plan       Co-evaluation              AM-PAC PT "6 Clicks" Mobility   Outcome Measure  Help needed turning from your back to your side while in a flat bed without using bedrails?: A Lot Help needed moving from lying on your back to sitting on the side of a flat bed without using bedrails?: A Lot Help needed moving to and from a bed to a chair (including a wheelchair)?: A Lot Help needed standing up from a chair using your arms (e.g., wheelchair or bedside chair)?: A Lot Help needed to walk in hospital room?: Total Help needed climbing 3-5 steps with a railing? : Total 6 Click Score: 10    End of Session Equipment Utilized During Treatment: Gait belt Activity Tolerance: Patient limited by fatigue;Patient limited by pain;Other (comment) Patient left: in bed;with call bell/phone within reach;with bed alarm set;with family/visitor present Nurse Communication: Mobility status PT Visit Diagnosis: Other abnormalities of gait and mobility (R26.89);Muscle weakness (generalized) (M62.81)     Time: 4098-1191 PT Time Calculation (min) (ACUTE ONLY): 32 min  Charges:    $Therapeutic Activity: 23-37 mins PT General Charges $$ ACUTE PT VISIT: 1 Visit                     Shela Nevin, PT, DPT Acute Rehab Services [phone]    [name] 11/18/2022, 12:37 PM

## 2022-11-18 NOTE — Progress Notes (Signed)
[facility]                                                                               [name] [name], is a 82 y.o. male, DOB - 1940-11-17, ONG:[id number] Admit date - 11/13/2022    Outpatient Primary MD for the patient is [name], NP  LOS - 4  days    Brief summary   [name] [name] is a 82 y.o. male with hx of Alzheimer's dementia, A-fib on AC, BLE venous insufficiency, diastolic heart failure, CAD, CKD stage III, HTN, HLD, OSA, who presented with worsening BLE swelling worse in the RLE X 2-3 weeks. He had been seen by PCP, placed on clindamycin without improvement in his symptoms. Denies any associated fevers.  Reports compliance with home Lasix and apixaban.  Reported to be chronically on doxycycline and Macrobid.    Assessment & Plan    Assessment and Plan:  Nonpurulent lower extremity cellulitis on the right in the setting of chronic venous stasis dermatitis/venous insufficiency Bilateral lower extremity Dopplers negative for DVT. Blood cultures have been negative so far. Complete 5 days of IV ancef.     Mild acute on chronic diastolic heart failure Elevated BNP on admission and with bilateral lower extremity edema. Echocardiogram showed preserved left ventricular ejection fraction with no regional wall motion abnormalities, has grade 1 diastolic dysfunction.  Received IV Lasix with good diuresis. Stopped the lasix, as he became hypotensive with therapy today.  Continue with strict intake and output and daily weights    Stage IIIa CKD Creatinine appears to be at baseline.    Paroxysmal atrial fibrillation Currently not in any rate controlling agents.  Patient is on Eliquis continue the same.   Essential hypertension Hypotensive yesterday while working with OT,   D/c lasix and losartan.  Bolus of RL, with improvement in the BP parameters.   Acute encephalopathy of unclear etiology in the setting of worsening dementia.  His mental status  improved once BP improved.  Ordered CT head without contrast further evaluation. Discussed with Radiology, no signs of acute stroke or hemorrhage on CT.  MRI brain without contrast is negative for acute stroke, but shows moderate to advanced chronic small-vessel ischemic change  Discussed the plan with the daughter.   Alzheimer's dementia:  Continue with aricept.   Fever, hypotensive, tachycardic, while on IV ancef Get UA and urine cultures, CXR.  Pt reports severe right shoulder pain.  Daughter at bedside reports having chronic shoulder issues.  Will get MRI of the right shoulder.    In view of his progressive decline, multiple medical issues, palliative care consulted for goals of care.    Estimated body mass index is 28.79 kg/m as calculated from the following:   Height as of this encounter: 5\' 10"  (1.778 m).   Weight as of this encounter: 91 kg.  Code Status: DNR- intervention.  No CPR or chest compressions, . May intubate use advanced airway interventions and cardioversion/ACLS medications if appropriate or indicated. May transfer to ICU.  DVT Prophylaxis:   apixaban (ELIQUIS) tablet 5 mg   Level of Care: Level of care: Med-Surg Family Communication: daughter at bedside.   Disposition  Plan:     Remains inpatient appropriate: fever , plan for SNF when medically stable,   Procedures:  MRI brain without contrast   Consultants:   Palliative care.   Antimicrobials:   Anti-infectives (From admission, onward)    Start     Dose/Rate Route Frequency Ordered Stop   11/15/22 0700  nitrofurantoin (macrocrystal-monohydrate) (MACROBID) capsule 100 mg        100 mg Oral Every morning 11/14/22 1428     11/14/22 1430  ceFAZolin (ANCEF) IVPB 2g/100 mL premix        2 g 200 mL/hr over 30 Minutes Intravenous Every 8 hours 11/14/22 1339 11/18/22 2359   11/14/22 0800  doxycycline (VIBRA-TABS) tablet 100 mg  Status:  Discontinued        100 mg Oral Every 24 hours 11/14/22 0317 11/14/22  1339   11/14/22 0230  vancomycin (VANCOCIN) IVPB 1000 mg/200 mL premix  Status:  Discontinued       Placed in "Followed by" Linked Group   1,000 mg 200 mL/hr over 60 Minutes Intravenous  Once 11/14/22 0112 11/14/22 1339   11/14/22 0115  vancomycin (VANCOCIN) IVPB 1000 mg/200 mL premix       Placed in "Followed by" Linked Group   1,000 mg 200 mL/hr over 60 Minutes Intravenous  Once 11/14/22 0112 11/14/22 0700   11/13/22 2315  clindamycin (CLEOCIN) IVPB 600 mg        600 mg 100 mL/hr over 30 Minutes Intravenous  Once 11/13/22 2314 11/14/22 0037        Medications  Scheduled Meds:  apixaban  5 mg Oral BID   diclofenac Sodium  4 g Topical QID   donepezil  10 mg Oral QHS   fesoterodine  8 mg Oral Daily   mirabegron ER  50 mg Oral q morning   nitrofurantoin (macrocrystal-monohydrate)  100 mg Oral q AM   rosuvastatin  5 mg Oral QODAY   Continuous Infusions:   ceFAZolin (ANCEF) IV Stopped (11/18/22 0503)   PRN Meds:.acetaminophen, ketorolac    Subjective:   Kandon Penning was seen and examined today. More alert and answering some questions.   Objective:   Vitals:   11/17/22 1959 11/17/22 2200 11/18/22 0424 11/18/22 0818  BP: 134/82  135/69 (!) 121/51  Pulse: 100  63 81  Resp: 19  18 17   Temp: (!) 100.8 F (38.2 C) 99.5 F (37.5 C) 98.8 F (37.1 C) 99.1 F (37.3 C)  TempSrc: Oral Oral Oral Oral  SpO2: 96%  97% 95%  Weight:      Height:        Intake/Output Summary (Last 24 hours) at 11/18/2022 1325 Last data filed at 11/18/2022 1000 Gross per 24 hour  Intake 1040 ml  Output 1200 ml  Net -160 ml   Filed Weights   11/14/22 2300 11/15/22 0105 11/16/22 0500  Weight: 89.2 kg 89.2 kg 91 kg     Exam General exam: ill appearing, not in distress.  Respiratory system: Clear to auscultation. Respiratory effort normal. Cardiovascular system: S1 & S2 heard, RRR. No JVD, Gastrointestinal system: Abdomen is nondistended, soft and nontender.  Central nervous system:  Alert and oriented to person only. Slow to respond. But no focal deficits.  Extremities: right shoulder tenderness.  Skin: chronic lower extremity venous stasis changes.  Psychiatry: unable to assess.     Data Reviewed:  I have personally reviewed following labs and imaging studies   CBC Lab Results  Component Value Date   WBC  8.8 11/18/2022   RBC 4.33 11/18/2022   HGB 13.9 11/18/2022   HCT 41.1 11/18/2022   MCV 94.9 11/18/2022   MCH 32.1 11/18/2022   PLT 253 11/18/2022   MCHC 33.8 11/18/2022   RDW 13.0 11/18/2022   LYMPHSABS 0.8 11/18/2022   MONOABS 1.0 11/18/2022   EOSABS 0.1 11/18/2022   BASOSABS 0.0 11/18/2022     Last metabolic panel Lab Results  Component Value Date   NA 134 (L) 11/18/2022   K 3.9 11/18/2022   CL 99 11/18/2022   CO2 23 11/18/2022   BUN 26 (H) 11/18/2022   CREATININE 1.19 11/18/2022   GLUCOSE 166 (H) 11/18/2022   GFRNONAA >60 11/18/2022   GFRAA >60 09/15/2018   CALCIUM 9.3 11/18/2022   PHOS 3.5 11/14/2022   PROT 6.6 11/13/2022   ALBUMIN 3.9 11/13/2022   BILITOT 0.5 11/13/2022   ALKPHOS 61 11/13/2022   AST 23 11/13/2022   ALT 26 11/13/2022   ANIONGAP 12 11/18/2022    CBG (last 3)  No results for input(s): "GLUCAP" in the last 72 hours.     Coagulation Profile: No results for input(s): "INR", "PROTIME" in the last 168 hours.   Radiology Studies: MR BRAIN WO CONTRAST  Result Date: 11/17/2022 CLINICAL DATA:  Altered mental status EXAM: MRI HEAD WITHOUT CONTRAST TECHNIQUE: Multiplanar, multiecho pulse sequences of the brain and surrounding structures were obtained without intravenous contrast. COMPARISON:  Same-day CT head FINDINGS: Brain: There is no acute intracranial hemorrhage, extra-axial fluid collection, or acute infarct. There is background parenchymal volume loss with prominence of the ventricular system and extra-axial CSF spaces. The ventricles are stable in size compared to the brain MRI from 2021. There is patchy and  confluent FLAIR signal abnormality in the supratentorial white matter likely reflecting sequela of moderate to advanced chronic small-vessel ischemic change The pituitary and suprasellar region are normal. There is no mass lesion. There is no mass effect or midline shift. Vascular: Normal flow voids. Skull and upper cervical spine: Normal marrow signal. Sinuses/Orbits: The paranasal sinuses are clear. Bilateral lens implants are in place. The globes and orbits are otherwise unremarkable. Other: The mastoid air cells and middle ear cavities are clear. IMPRESSION: No acute intracranial pathology. Electronically Signed   By: Lesia Hausen M.D.   On: 11/17/2022 19:17   CT HEAD WO CONTRAST ( )  Result Date: 11/17/2022 CLINICAL DATA:  Mental status change, unknown cause EXAM: CT HEAD WITHOUT CONTRAST TECHNIQUE: Contiguous axial images were obtained from the base of the skull through the vertex without intravenous contrast. RADIATION DOSE REDUCTION: This exam was performed according to the departmental dose-optimization program which includes automated exposure control, adjustment of the mA and/or kV according to patient size and/or use of iterative reconstruction technique. COMPARISON:  CT Head 04/02/21 FINDINGS: Brain: No hemorrhage. No hydrocephalus. No extra-axial fluid collection. There is sequela of severe chronic microvascular ischemic change. No CT evidence of an acute cortical infarct. No mass effect. No mass lesion. Vascular: No hyperdense vessel or unexpected calcification. Skull: Normal. Negative for fracture or focal lesion. Sinuses/Orbits: No middle ear or mastoid effusion. Paranasal sinuses are clear. Bilateral lens replacement. Orbits are otherwise unremarkable. Other: None. IMPRESSION: No hemorrhage or CT evidence of an acute cortical infarct. If there is high clinical concern for an acute infarct, further evaluation with a brain MRi is recommended. Electronically Signed   By: Lorenza Cambridge M.D.   On:  11/17/2022 14:26       Kathlen Mody M.D. [facility] 11/18/2022,  1:25 PM  Available via Epic secure chat 7am-7pm After 7 pm, please refer to night coverage provider listed on amion.

## 2022-11-18 NOTE — Progress Notes (Signed)
Patient transported to MRI, daughter at bedside.

## 2022-11-18 NOTE — Progress Notes (Signed)
Mobility Specialist: Progress Note   11/18/22 1456  Mobility  Activity Stood at bedside  Level of Assistance Maximum assist, patient does 25-49% (+2)  Assistive Device Front wheel walker  Activity Response Tolerated poorly  Mobility Referral Yes  $Mobility charge 1 Mobility  Mobility Specialist Start Time (ACUTE ONLY) 1145  Mobility Specialist Stop Time (ACUTE ONLY) 1214  Mobility Specialist Time Calculation (min) (ACUTE ONLY) 29 min    Sitting BP: 144/81  Pt was agreeable to mobility session - received in bed. Co-treat with PT. MaxA+2 for bed mobility using helicopter method - was able to mobilize leg towards EOB but kept taking breaks in between each movement, denied any fatigue or pain. Upon sitting EOB, pt started leaning back and towards the left but when asked pt stated he did not know he was beginning to lean. PT and MS had to hold up pt or correct posture while sitting at beginning and end of session. MaxA+2 for STS. Heavy L sided lean upon standing - when asked to correct he was unable to put more weight on R side or RLE. Still having issues with RUE that seem to be getting worse - small movements are painful and he still needs to use L hand to assist movement. Transfer to chair deferred as he was unable to take any more pivot steps towards chair. Left in bed with all needs met, call bell in reach. Family in room.   Maurene Capes Mobility Specialist Please contact via SecureChat or Rehab office at [phone]

## 2022-11-18 NOTE — TOC Initial Note (Signed)
Transition of Care [facility]) - Initial/Assessment Note    Patient Details  Name: [name] [name] MRN: [id number] Date of Birth: 03/26/1940  Transition of Care [facility]) CM/SW Contact:    Lorri Frederick, LCSW Phone Number: 11/18/2022, 3:34 PM  Clinical Narrative:     Pt oriented x1, CSW spoke with [name], sister Denny Peon in room. Pt from home with Vinnie Langton, no current services.  Discussed DC plan, HH vs SNF.  [name] somewhat upset, said pt seemed find about a week ago, walking 3/4 mile per day.  Her preference would be for pt to DC home but not sure if they can manage.  Agreed to wait on PT session from today.  1215: PT complete, they are recommending SNF.  CSW spoke again with Vinnie Langton and she is in agreement.  Medicare choice document provided.  Would be interested in [place].              Expected Discharge Plan: Skilled Nursing Facility Barriers to Discharge: Continued Medical Work up, SNF Pending bed offer   Patient Goals and CMS Choice   CMS Medicare.gov Compare Post Acute Care list provided to:: Patient Represenative (must comment) ([name]) Choice offered to / list presented to : Adult Children      Expected Discharge Plan and Services In-house Referral: Clinical Social Work   Post Acute Care Choice: Skilled Nursing Facility Living arrangements for the past 2 months: Single Family Home                                      Prior Living Arrangements/Services Living arrangements for the past 2 months: Single Family Home Lives with:: Adult Children (with [name]) Patient language and need for interpreter reviewed:: No        Need for Family Participation in Patient Care: Yes (Comment) Care giver support system in place?: Yes (comment) Current home services: Other (comment) (none) Criminal Activity/Legal Involvement Pertinent to Current Situation/Hospitalization: No - Comment as needed  Activities of Daily Living Home Assistive  Devices/Equipment: None ADL Screening (condition at time of admission) Patient's cognitive ability adequate to safely complete daily activities?: No Is the patient deaf or have difficulty hearing?: Yes Does the patient have difficulty seeing, even when wearing glasses/contacts?: No Does the patient have difficulty concentrating, remembering, or making decisions?: No Patient able to express need for assistance with ADLs?: Yes Does the patient have difficulty dressing or bathing?: No Independently performs ADLs?: No Communication: Independent Dressing (OT): Independent Grooming: Independent Feeding: Independent Bathing: Independent Toileting: Independent In/Out Bed: Independent Walks in Home: Independent Does the patient have difficulty walking or climbing stairs?: No Weakness of Legs: Right Weakness of Arms/Hands: None  Permission Sought/Granted                  Emotional Assessment Appearance:: Appears stated age Attitude/Demeanor/Rapport: Unable to Assess Affect (typically observed): Unable to Assess Orientation: : Oriented to Self      Admission diagnosis:  Cellulitis [L03.90] Peripheral vascular disease (HCC) [I73.9] Peripheral edema [R60.0] Cellulitis of right lower extremity [L03.115] Patient Active Problem List   Diagnosis Date Noted   Asymptomatic bacteriuria 04/06/2021   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/03/2021   Lactic acidosis 04/02/2021   Paroxysmal atrial fibrillation (HCC) 04/02/2021   SIRS (systemic inflammatory response syndrome) (HCC) 04/02/2021   Acute respiratory distress 04/02/2021   Dementia with behavioral disturbance (HCC) 04/02/2021   Sepsis (HCC) 01/08/2021  Neck infection 01/08/2021   Encephalopathy due to COVID-19 virus 01/08/2021   Transaminitis 01/08/2021   Sepsis secondary to UTI (HCC) 09/11/2020   Dysuria 08/10/2020   Acute parotitis 07/27/2020   Facial cellulitis 07/26/2020   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    Primary osteoarthritis of left hip 09/06/2018   Alzheimer's dementia (HCC) 12/07/2016   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   Venous stasis dermatitis of left lower extremity 04/07/2016   Bilateral impacted cerumen 01/18/2016   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   Unsteadiness 01/18/2016   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   Cellulitis of left lower leg    Left leg swelling    Cellulitis 08/23/2014   Chronic diastolic CHF-EF noted to be 60- 65% ( grade 1 diastolic dysfunction by TTE in 04/2020) 04/29/2014   DOE (dyspnea on exertion) 11/12/2011   Stage 3a chronic kidney disease (HCC)    Thrombocytopenia, unspecified (HCC) 07/29/2010   Vitamin D deficiency 05/19/2010   Depression, major, recurrent, 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   Urinary urgency 05/19/2010   Vertigo, 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   Benign essential hypertension 12/23/2009   Actinic keratosis 12/23/2009   Seborrheic keratosis 03/18/2009   Benign neoplasm 03/18/2009   Dyslipidemia 01/18/2008   OSA on CPAP 01/18/2008   Coronary atherosclerosis 01/18/2008   Atherosclerotic heart disease of native coronary artery without angina pectoris 01/18/2008   Cataract in degenerative disorder 11/21/2006   Unspecified protein-calorie malnutrition (HCC) 09/27/2006   Gastro-esophageal reflux disease with esophagitis 09/15/2006   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) 05/11/2005   CAD (nonobstructive CAD seen on LHC 2010 ) 05/11/2005   PCP:  [name], NP Pharmacy:   [facility] [phone] - [place], [place] - [address] [place] [address] Phone: [phone] Fax: [phone]     Social Determinants of Health (SDOH) Social History: SDOH Screenings   Food Insecurity: No Food Insecurity (11/14/2022)  Housing: Low Risk  (11/14/2022)   Transportation Needs: No Transportation Needs (11/14/2022)  Utilities: Not At Risk (11/14/2022)  Depression (PHQ2-9): Low Risk  (08/10/2020)  Financial Resource Strain: Low Risk  (09/27/2022)   Received from [facility]  Physical Activity: Insufficiently Active (09/27/2022)   Received from [facility]  Social Connections: Socially Integrated (09/27/2022)   Received from [facility]  Stress: No Stress Concern Present (09/27/2022)   Received from [facility]  Tobacco Use: Medium Risk (11/13/2022)   SDOH Interventions:     Readmission Risk Interventions     No data to display

## 2022-11-19 DIAGNOSIS — I739 Peripheral vascular disease, unspecified: Secondary | ICD-10-CM

## 2022-11-19 DIAGNOSIS — Z66 Do not resuscitate: Secondary | ICD-10-CM

## 2022-11-19 DIAGNOSIS — Z515 Encounter for palliative care: Secondary | ICD-10-CM | POA: Diagnosis not present

## 2022-11-19 DIAGNOSIS — Z7189 Other specified counseling: Secondary | ICD-10-CM | POA: Diagnosis not present

## 2022-11-19 DIAGNOSIS — R4182 Altered mental status, unspecified: Secondary | ICD-10-CM

## 2022-11-19 DIAGNOSIS — L03115 Cellulitis of right lower limb: Secondary | ICD-10-CM | POA: Diagnosis not present

## 2022-11-19 LAB — CULTURE, BLOOD (ROUTINE X 2)
Culture: NO GROWTH
Culture: NO GROWTH
Special Requests: ADEQUATE
Special Requests: ADEQUATE

## 2022-11-19 NOTE — Progress Notes (Signed)
Daily Progress Note   Patient Name: [name] [name]       Date: 11/19/2022 DOB: 08/13/1940  Age: 82 y.o. MRN#: [id number] Attending Physician: Kathlen Mody, MD Primary Care Physician: [name], NP Admit Date: 11/13/2022  Reason for Consultation/Follow-up: Establishing goals of care  Subjective: Patient is sitting in bed waiting for nursing to assist him in sitting on edge of bed. Daughter at bedside.  Length of Stay: 5  Current Medications: Scheduled Meds:   apixaban  5 mg Oral BID   diclofenac Sodium  4 g Topical QID   donepezil  10 mg Oral QHS   fesoterodine  8 mg Oral Daily   mirabegron ER  50 mg Oral q morning   nitrofurantoin (macrocrystal-monohydrate)  100 mg Oral q AM   rosuvastatin  5 mg Oral QODAY    Continuous Infusions:   PRN Meds: acetaminophen, ketorolac, methocarbamol, traMADol  Physical Exam Vitals reviewed.  HENT:     Head: Normocephalic and atraumatic.  Cardiovascular:     Rate and Rhythm: Normal rate.  Pulmonary:     Effort: Pulmonary effort is normal.  Skin:    General: Skin is warm and dry.  Neurological:     Mental Status: He is alert and oriented to person, place, and time.  Psychiatric:        Behavior: Behavior normal.             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   Lactic acidosis 04/02/2021   Paroxysmal atrial fibrillation (HCC) 04/02/2021   SIRS (systemic inflammatory response syndrome) (HCC)  04/02/2021   Acute respiratory distress 04/02/2021   Dementia with behavioral disturbance (HCC) 04/02/2021   Sepsis (HCC) 01/08/2021   Neck infection 01/08/2021   Encephalopathy due to COVID-19 virus 01/08/2021   Transaminitis 01/08/2021   Sepsis secondary to UTI (HCC) 09/11/2020   Dysuria 08/10/2020  Acute parotitis 07/27/2020   Facial cellulitis 07/26/2020   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   Primary osteoarthritis of left hip 09/06/2018   Alzheimer's dementia (HCC) 12/07/2016   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   Venous stasis dermatitis of left lower extremity 04/07/2016   Bilateral impacted cerumen 01/18/2016   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   Unsteadiness 01/18/2016   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   Cellulitis of left lower leg    Left leg swelling    Cellulitis 08/23/2014   Chronic diastolic CHF-EF noted to be 60- 65% ( grade 1 diastolic dysfunction by TTE in 04/2020) 04/29/2014   DOE (dyspnea on exertion) 11/12/2011   Stage 3a chronic kidney disease (HCC)    Thrombocytopenia, unspecified (HCC) 07/29/2010   Vitamin D deficiency 05/19/2010   Depression, major, recurrent, 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   Urinary urgency 05/19/2010   Vertigo, 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   Benign essential hypertension 12/23/2009   Actinic keratosis 12/23/2009   Seborrheic keratosis 03/18/2009   Benign neoplasm 03/18/2009   Dyslipidemia 01/18/2008   OSA on CPAP 01/18/2008   Coronary atherosclerosis 01/18/2008   Atherosclerotic heart disease of native coronary artery without angina pectoris 01/18/2008   Cataract in degenerative disorder 11/21/2006   Unspecified protein-calorie malnutrition (HCC) 09/27/2006   Gastro-esophageal reflux disease with esophagitis 09/15/2006   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 male with past medical history of Alzheimer's dementia, A-fib on AC, BLE venous insufficiency, diastolic heart failure, CAD, CKD stage III, HTN, HLD, and OSA admitted on 11/13/2022 with worsening BLE swelling worse in the RLE X 2-3 weeks. Patient diagnosed with lower extremity cellulitis in the setting of chronic venous stasis dermatitis and venous insufficiency. Started on IV antibiotics. Also found to have acute on chronic heart failure. Patient's mental status and functional ability is worsened throughout hospitalization. PMT consulted to discuss goals of care.   Assessment: Followed up with daughter regarding MOST form. Answered questions about MOST form and scopes of care. After starting the form the daughter shared she is very overwhelmed. I encouraged her to take more time to talk with her brother before we complete the form.  She is worried about care needs moving forward and what rehab might look like for her dad. She is worried she cannot assist him with his mobility needs at this time. We discussed taking things one day at a time.  Recommendations/Plan: Maintain DNR, intubation okay Continue current care, allow time for outcomes Follow up MOST-- daughter needs more time   Code Status:    Code Status Orders  (From admission, onward)           Start     Ordered   11/17/22 1634  Do not attempt resuscitation (DNR) Pre-Arrest Interventions Desired  (Code Status)  Continuous       Question Answer Comment  If pulseless and not breathing No CPR or chest compressions.   In Pre-Arrest Conditions (Patient Has Pulse and Is Breathing) May intubate, use advanced airway interventions and cardioversion/ACLS medications if appropriate or indicated. May transfer to ICU.   Consent: Discussion documented in EHR or advanced directives reviewed      11/17/22 1633         Extensive chart review has been  completed  prior to seeing the patient and his daughter including labs, vital signs, imaging, progress/consult notes, orders, medications and available advance directive documents.  Time spent: 50 minutes     Thank you for allowing the Palliative Medicine Team to assist in the care of this patient.   Sherryll Burger, NP  Please contact Palliative Medicine Team phone at [phone] for questions and concerns.

## 2022-11-19 NOTE — Progress Notes (Signed)
[facility]                                                                               [name] [name], is a 82 y.o. male, DOB - November 01, 1940, ZOX:[id number] Admit date - 11/13/2022    Outpatient Primary MD for the patient is [name], NP  LOS - 5  days    Brief summary   [name] [name] is a 82 y.o. male with hx of Alzheimer's dementia, A-fib on AC, BLE venous insufficiency, diastolic heart failure, CAD, CKD stage III, HTN, HLD, OSA, who presented with worsening BLE swelling worse in the RLE X 2-3 weeks. He had been seen by PCP, placed on clindamycin without improvement in his symptoms. Denies any associated fevers.  Reports compliance with home Lasix and apixaban.  Reported to be chronically on doxycycline and Macrobid.    Assessment & Plan    Assessment and Plan:  Nonpurulent lower extremity cellulitis on the right in the setting of chronic venous stasis dermatitis/venous insufficiency Bilateral lower extremity Dopplers negative for DVT. Blood cultures have been negative so far. Complete 5 days of IV ancef.     Mild acute on chronic diastolic heart failure Elevated BNP on admission and with bilateral lower extremity edema. Echocardiogram showed preserved left ventricular ejection fraction with no regional wall motion abnormalities, has grade 1 diastolic dysfunction.  Received IV Lasix with good diuresis. Stopped the lasix, as he became hypotensive with therapy . Continue with strict intake and output and daily weights    Stage IIIa CKD Creatinine appears to be at baseline.    Paroxysmal atrial fibrillation Currently not in any rate controlling agents.  Patient is on Eliquis continue the same.   Essential hypertension Hypotensive yesterday while working with OT,   D/c lasix and losartan.  Bolus of RL, with improvement in the BP parameters.   Acute encephalopathy of unclear etiology in the setting of worsening dementia.  His mental status  improved once BP improved.  Ordered CT head without contrast further evaluation. Discussed with Radiology, no signs of acute stroke or hemorrhage on CT.  MRI brain without contrast is negative for acute stroke, but shows moderate to advanced chronic small-vessel ischemic change  Discussed the plan with the daughter.   Alzheimer's dementia:  Continue with aricept.   Fever, hypotensive, tachycardia  LACTIC acid wnl. Fever curve resolved.  Get UA and urine cultures, CXR is unremarkable. Blood cultures done and negative so far.  Pt reports severe right shoulder pain.  Daughter at bedside reports having chronic shoulder issues.  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 Discussed with Dr Aundria Rud with orthopedics recommends outpatient follow up int he office for intraarticular steroid injection vs IR guided injection inpatient.     In view of his progressive decline, multiple medical issues, palliative care consulted for goals of care.    Estimated body mass index is 28.79 kg/m as calculated from the following:   Height as of this encounter: 5\' 10"  (1.778 m).   Weight as of this encounter: 91 kg.  Code Status: DNR- intervention.  No CPR or chest compressions, . May intubate use advanced airway  interventions and cardioversion/ACLS medications if appropriate or indicated. May transfer to ICU.  DVT Prophylaxis:   apixaban (ELIQUIS) tablet 5 mg   Level of Care: Level of care: Med-Surg Family Communication: daughter at bedside.   Disposition Plan:     Remains inpatient appropriate: fever , plan for SNF when medically stable,   Procedures:  MRI brain without contrast   Consultants:   Palliative care.   Antimicrobials:   Anti-infectives (From admission, onward)    Start     Dose/Rate Route Frequency Ordered Stop   11/15/22 0700  nitrofurantoin (macrocrystal-monohydrate) (MACROBID) capsule 100 mg        100 mg Oral Every morning 11/14/22 1428     11/14/22  1430  ceFAZolin (ANCEF) IVPB 2g/100 mL premix        2 g 200 mL/hr over 30 Minutes Intravenous Every 8 hours 11/14/22 1339 11/18/22 2134   11/14/22 0800  doxycycline (VIBRA-TABS) tablet 100 mg  Status:  Discontinued        100 mg Oral Every 24 hours 11/14/22 0317 11/14/22 1339   11/14/22 0230  vancomycin (VANCOCIN) IVPB 1000 mg/200 mL premix  Status:  Discontinued       Placed in "Followed by" Linked Group   1,000 mg 200 mL/hr over 60 Minutes Intravenous  Once 11/14/22 0112 11/14/22 1339   11/14/22 0115  vancomycin (VANCOCIN) IVPB 1000 mg/200 mL premix       Placed in "Followed by" Linked Group   1,000 mg 200 mL/hr over 60 Minutes Intravenous  Once 11/14/22 0112 11/14/22 0700   11/13/22 2315  clindamycin (CLEOCIN) IVPB 600 mg        600 mg 100 mL/hr over 30 Minutes Intravenous  Once 11/13/22 2314 11/14/22 0037        Medications  Scheduled Meds:  apixaban  5 mg Oral BID   diclofenac Sodium  4 g Topical QID   donepezil  10 mg Oral QHS   fesoterodine  8 mg Oral Daily   mirabegron ER  50 mg Oral q morning   nitrofurantoin (macrocrystal-monohydrate)  100 mg Oral q AM   rosuvastatin  5 mg Oral QODAY   Continuous Infusions:   PRN Meds:.acetaminophen, ketorolac, methocarbamol, traMADol    Subjective:   [name] [name] was seen and examined today. No new complaints overnight.   Objective:   Vitals:   11/18/22 1929 11/18/22 2105 11/19/22 0048 11/19/22 0750  BP: 131/69  (!) 146/80 124/68  Pulse: 89 86 89 77  Resp: 18 18 18 16   Temp: 99.2 F (37.3 C)  97.6 F (36.4 C) 98.8 F (37.1 C)  TempSrc: Oral   Oral  SpO2: 95% 95% 97% 94%  Weight:      Height:        Intake/Output Summary (Last 24 hours) at 11/19/2022 1611 Last data filed at 11/19/2022 1354 Gross per 24 hour  Intake 720 ml  Output 1800 ml  Net -1080 ml   Filed Weights   11/14/22 2300 11/15/22 0105 11/16/22 0500  Weight: 89.2 kg 89.2 kg 91 kg     Exam General exam: Appears calm and comfortable   Respiratory system: Clear to auscultation. Respiratory effort normal. Cardiovascular system: S1 & S2 heard, RRR. No JVD,  Gastrointestinal system: Abdomen is nondistended, soft and nontender.  Central nervous system: Alert and oriented to person only.  Extremities:severe right shoulder tenderness.  Skin: No rashes, Psychiatry: Mood & affect appropriate.      Data Reviewed:  I have personally reviewed  following labs and imaging studies   CBC Lab Results  Component Value Date   WBC 8.8 11/18/2022   RBC 4.33 11/18/2022   HGB 13.9 11/18/2022   HCT 41.1 11/18/2022   MCV 94.9 11/18/2022   MCH 32.1 11/18/2022   PLT 253 11/18/2022   MCHC 33.8 11/18/2022   RDW 13.0 11/18/2022   LYMPHSABS 0.8 11/18/2022   MONOABS 1.0 11/18/2022   EOSABS 0.1 11/18/2022   BASOSABS 0.0 11/18/2022     Last metabolic panel Lab Results  Component Value Date   NA 134 (L) 11/18/2022   K 3.9 11/18/2022   CL 99 11/18/2022   CO2 23 11/18/2022   BUN 26 (H) 11/18/2022   CREATININE 1.19 11/18/2022   GLUCOSE 166 (H) 11/18/2022   GFRNONAA >60 11/18/2022   GFRAA >60 09/15/2018   CALCIUM 9.3 11/18/2022   PHOS 3.5 11/14/2022   PROT 6.6 11/13/2022   ALBUMIN 3.9 11/13/2022   BILITOT 0.5 11/13/2022   ALKPHOS 61 11/13/2022   AST 23 11/13/2022   ALT 26 11/13/2022   ANIONGAP 12 11/18/2022    CBG (last 3)  No results for input(s): "GLUCAP" in the last 72 hours.     Coagulation Profile: No results for input(s): "INR", "PROTIME" in the last 168 hours.   Radiology Studies: DG CHEST PORT 1 VIEW  Result Date: 11/18/2022 CLINICAL DATA:  Fever EXAM: PORTABLE CHEST 1 VIEW COMPARISON:  None Available. FINDINGS: Lungs are clear. No pneumothorax or pleural effusion. Cardiac size within normal limits. Pulmonary vascularity is normal. Osseous structures are age-appropriate. Asymmetric degenerative changes noted within the right shoulder. No acute bone abnormality. IMPRESSION: No active disease. Electronically  Signed   By: Helyn Numbers M.D.   On: 11/18/2022 20:31   MR SHOULDER RIGHT WO CONTRAST  Result Date: 11/18/2022 CLINICAL DATA:  Chronic shoulder pain. Rotator cuff disorder suspected. Multiple repeats were motion. EXAM: MRI OF THE RIGHT SHOULDER WITHOUT CONTRAST TECHNIQUE: Multiplanar, multisequence MR imaging of the shoulder was performed. No intravenous contrast was administered. COMPARISON:  Right shoulder radiographs 08/07/2019 FINDINGS: Despite efforts by the technologist and patient, mild motion artifact is present on today's exam and could not be eliminated. This reduces exam sensitivity and specificity. Rotator cuff: There is moderate partial-thickness tearing of the mid and AP supraspinatus tendon footprint in a region measuring up to 2 cm in AP dimension (coronal series 3 images 12 through 15, sagittal series 6, image 17). There is also attenuation suggesting partial-thickness tearing of the more proximal aspect of the far anterior supraspinatus tendon at the tendon critical zone (coronal series 3, image 15 and sagittal series 6, image 15). Minimal anterior infraspinatus intermediate T2 signal tendinosis. There is mild partial-thickness articular sided tearing of the superior subscapularis tendon insertion. The teres minor appears intact. Muscles: There is moderate supraspinatus and mild anterior infraspinatus muscle atrophy. Biceps long head: The proximal long head of the biceps tendon is not visualized proximal to the bicipital groove. The tendon is seen within the mid to distal aspect of the bicipital groove, suggesting a proximal tendon rupture with distal tendon retraction. Acromioclavicular Joint: Follicular prior postsurgical changes of acromioplasty, with flat undersurface of the acromion. Type I acromion. Likely postsurgical changes of partial distal clavicle excision. Glenohumeral Joint: Moderate glenohumeral joint effusion. Severe glenohumeral osteoarthritis including diffuse full-thickness  glenoid and humeral head cartilage loss, moderate medial humeral head and glenoid fossa cortical flattening/remodeling, large inferior humeral head-neck junction degenerative osteophytes, and moderate peripheral glenoid degenerative osteophytosis. There are multiple well corticated  fat intensity loose bodies, measuring up to 14 mm within the superior glenohumeral joint space. There are also numerous punctate decreased T1 and intermediate T2 signal loose bodies throughout the axillary pouch. Labrum: Degenerative attenuation and tears of the anterior inferior glenoid labrum. Bones: There is a shallow 2 mm concavity measuring up to 8 mm in transverse dimension within the superior aspect of the humeral head, likely chronic/degenerative. Deep to this, there is a predominantly decreased T1 increased T2 signal cyst developing throughout the humeral head, measuring up to 3.1 x 2.8 x 4.3 cm (transverse by AP by craniocaudal). This was also seen of similar size on remote 08/07/2019 radiograph, and appears to be a large benign subchondral cyst caused by synovial fluid infiltrating the bone through the cartilage defect. Other: None. IMPRESSION: 1. Severe glenohumeral osteoarthritis. There is an associated chondral/cortical defect within the superior humeral head and a large chronic subchondral cyst throughout the majority of the humeral head. This appears fairly stable with remote 08/07/2019 radiographs. 2. Moderate partial-thickness tearing of the mid and AP supraspinatus tendon footprint. 3. Mild partial-thickness articular sided tearing of the superior subscapularis tendon insertion. 4. Moderate supraspinatus and mild anterior infraspinatus muscle atrophy. 5. The proximal long head of the biceps tendon is not visualized proximal to the bicipital groove, suggesting a proximal tendon rupture with distal tendon retraction. 6. Prior postsurgical changes of acromioplasty and partial distal clavicle excision. Electronically Signed    By: Neita Garnet M.D.   On: 11/18/2022 16:47   MR BRAIN WO CONTRAST  Result Date: 11/17/2022 CLINICAL DATA:  Altered mental status EXAM: MRI HEAD WITHOUT CONTRAST TECHNIQUE: Multiplanar, multiecho pulse sequences of the brain and surrounding structures were obtained without intravenous contrast. COMPARISON:  Same-day CT head FINDINGS: Brain: There is no acute intracranial hemorrhage, extra-axial fluid collection, or acute infarct. There is background parenchymal volume loss with prominence of the ventricular system and extra-axial CSF spaces. The ventricles are stable in size compared to the brain MRI from 2021. There is patchy and confluent FLAIR signal abnormality in the supratentorial white matter likely reflecting sequela of moderate to advanced chronic small-vessel ischemic change The pituitary and suprasellar region are normal. There is no mass lesion. There is no mass effect or midline shift. Vascular: Normal flow voids. Skull and upper cervical spine: Normal marrow signal. Sinuses/Orbits: The paranasal sinuses are clear. Bilateral lens implants are in place. The globes and orbits are otherwise unremarkable. Other: The mastoid air cells and middle ear cavities are clear. IMPRESSION: No acute intracranial pathology. Electronically Signed   By: Lesia Hausen M.D.   On: 11/17/2022 19:17       Kathlen Mody M.D. [facility] 11/19/2022, 4:11 PM  Available via Epic secure chat 7am-7pm After 7 pm, please refer to night coverage provider listed on amion.

## 2022-11-19 NOTE — Progress Notes (Signed)
   11/19/22 2129  BiPAP/CPAP/SIPAP  $ Non-Invasive Home Ventilator   (Home unit)  BiPAP/CPAP/SIPAP Pt Type Adult  BiPAP/CPAP/SIPAP Resmed  Mask Type Full face mask  FiO2 (%) 21 %  Patient Home Equipment Yes  Safety Check Completed by RT for Home Unit Yes, no issues noted  BiPAP/CPAP /SiPAP Vitals  Pulse Rate 80  Resp 18  SpO2 96 %  Bilateral Breath Sounds Clear;Diminished  MEWS Score/Color  MEWS Score 0  MEWS Score Color Green

## 2022-11-20 DIAGNOSIS — R4182 Altered mental status, unspecified: Secondary | ICD-10-CM | POA: Diagnosis not present

## 2022-11-20 DIAGNOSIS — L03115 Cellulitis of right lower limb: Secondary | ICD-10-CM | POA: Diagnosis not present

## 2022-11-20 DIAGNOSIS — Z7189 Other specified counseling: Secondary | ICD-10-CM | POA: Diagnosis not present

## 2022-11-20 DIAGNOSIS — I739 Peripheral vascular disease, unspecified: Secondary | ICD-10-CM | POA: Diagnosis not present

## 2022-11-20 DIAGNOSIS — Z515 Encounter for palliative care: Secondary | ICD-10-CM | POA: Diagnosis not present

## 2022-11-20 NOTE — Progress Notes (Signed)
Daily Progress Note   Patient Name: [name] [name]       Date: 11/20/2022 DOB: 12/04/1940  Age: 82 y.o. MRN#: [id number] Attending Physician: Kathlen Mody, MD Primary Care Physician: [name], NP Admit Date: 11/13/2022  Reason for Consultation/Follow-up: Establishing goals of care  Subjective: Patient is sitting in bed. He just returned from walking down the hall with staff. Daughter at bedside.  Length of Stay: 6  Current Medications: Scheduled Meds:   apixaban  5 mg Oral BID   diclofenac Sodium  4 g Topical QID   donepezil  10 mg Oral QHS   fesoterodine  8 mg Oral Daily   mirabegron ER  50 mg Oral q morning   nitrofurantoin (macrocrystal-monohydrate)  100 mg Oral q AM   rosuvastatin  5 mg Oral QODAY    Continuous Infusions:   PRN Meds: acetaminophen, ketorolac, methocarbamol, traMADol  Physical Exam Vitals reviewed.  HENT:     Head: Normocephalic and atraumatic.  Cardiovascular:     Rate and Rhythm: Normal rate.  Pulmonary:     Effort: Pulmonary effort is normal.  Skin:    General: Skin is warm and dry.  Neurological:     Mental Status: He is alert and oriented to person, place, and time.  Psychiatric:        Behavior: Behavior normal.             Vital Signs: BP (!) 140/90 (BP Location: Left Arm)   Pulse 81   Temp 98 F (36.7 C) (Oral)   Resp 18   Ht 5\' 10"  (1.778 m)   Wt 91 kg   SpO2 95%   BMI 28.79 kg/m  SpO2: SpO2: 95 % O2 Device: O2 Device: Room Air O2 Flow Rate:      Patient Active Problem List   Diagnosis Date Noted   Asymptomatic bacteriuria 04/06/2021   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/03/2021   Lactic acidosis 04/02/2021   Paroxysmal atrial fibrillation (HCC) 04/02/2021   SIRS (systemic inflammatory response syndrome) (HCC)  04/02/2021   Acute respiratory distress 04/02/2021   Dementia with behavioral disturbance (HCC) 04/02/2021   Sepsis (HCC) 01/08/2021   Neck infection 01/08/2021   Encephalopathy due to COVID-19 virus 01/08/2021   Transaminitis 01/08/2021   Sepsis secondary to UTI (HCC) 09/11/2020   Dysuria 08/10/2020  Acute parotitis 07/27/2020   Facial cellulitis 07/26/2020   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   Primary osteoarthritis of left hip 09/06/2018   Alzheimer's dementia (HCC) 12/07/2016   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   Venous stasis dermatitis of left lower extremity 04/07/2016   Bilateral impacted cerumen 01/18/2016   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   Unsteadiness 01/18/2016   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   Cellulitis of left lower leg    Left leg swelling    Cellulitis 08/23/2014   Chronic diastolic CHF-EF noted to be 60- 65% ( grade 1 diastolic dysfunction by TTE in 04/2020) 04/29/2014   DOE (dyspnea on exertion) 11/12/2011   Stage 3a chronic kidney disease (HCC)    Thrombocytopenia, unspecified (HCC) 07/29/2010   Vitamin D deficiency 05/19/2010   Depression, major, recurrent, 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   Urinary urgency 05/19/2010   Vertigo, 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   Benign essential hypertension 12/23/2009   Actinic keratosis 12/23/2009   Seborrheic keratosis 03/18/2009   Benign neoplasm 03/18/2009   Dyslipidemia 01/18/2008   OSA on CPAP 01/18/2008   Coronary atherosclerosis 01/18/2008   Atherosclerotic heart disease of native coronary artery without angina pectoris 01/18/2008   Cataract in degenerative disorder 11/21/2006   Unspecified protein-calorie malnutrition (HCC) 09/27/2006   Gastro-esophageal reflux disease with esophagitis 09/15/2006   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 male with past medical history of Alzheimer's dementia, A-fib on AC, BLE venous insufficiency, diastolic heart failure, CAD, CKD stage III, HTN, HLD, and OSA admitted on 11/13/2022 with worsening BLE swelling worse in the RLE X 2-3 weeks. Patient diagnosed with lower extremity cellulitis in the setting of chronic venous stasis dermatitis and venous insufficiency. Started on IV antibiotics. Also found to have acute on chronic heart failure. Patient's mental status and functional ability is worsened throughout hospitalization. PMT consulted to discuss goals of care.   Assessment: At daughter's request followed up with MOST form completion. We completed a MOST form today and placed it in his chart. The patient and family outlined their wishes for the following treatment decisions:  Cardiopulmonary Resuscitation: Do Not Attempt Resuscitation (DNR/No CPR)  Medical Interventions: Full Scope of Treatment: Use intubation, advanced airway interventions, mechanical ventilation, cardioversion as indicated, medical treatment, IV fluids, etc, also provide comfort measures. Transfer to the hospital if indicated  Antibiotics: Antibiotics if indicated  IV Fluids: IV fluids if indicated  Feeding Tube: Feeding tube for a defined trial period    Daughter states she is less concerned about the patient's mobility after seeing him walk to the end of the hallway today. She does want to talk to SW about his discharge-- sent message.  Recommendations/Plan: DNR Full scope Continue current care, allow time for outcomes Spiritual care request- consult placed   Code Status:    Code Status Orders  (From admission, onward)           Start     Ordered   11/17/22 1634  Do not attempt resuscitation (DNR) Pre-Arrest Interventions Desired  (Code Status)  Continuous       Question Answer Comment  If pulseless and not breathing No CPR  or chest compressions.   In Pre-Arrest Conditions (Patient Has Pulse and Is Breathing) May intubate, use advanced airway  interventions and cardioversion/ACLS medications if appropriate or indicated. May transfer to ICU.   Consent: Discussion documented in EHR or advanced directives reviewed      11/17/22 1633         Extensive chart review has been completed prior to seeing the patient and his daughter including labs, vital signs, imaging, progress/consult notes, orders, medications and available advance directive documents.  Time spent: 30 minutes     Thank you for allowing the Palliative Medicine Team to assist in the care of this patient.   Sherryll Burger, NP  Please contact Palliative Medicine Team phone at [phone] for questions and concerns.

## 2022-11-20 NOTE — Progress Notes (Signed)
Pt has home cpap.  

## 2022-11-20 NOTE — Progress Notes (Signed)
[facility]                                                                               [name] [name], is a 82 y.o. male, DOB - November 23, 1940, ZOX:[id number] Admit date - 11/13/2022    Outpatient Primary MD for the patient is [name], NP  LOS - 6  days    Brief summary   [name] [name] is a 82 y.o. male with hx of Alzheimer's dementia, A-fib on AC, BLE venous insufficiency, diastolic heart failure, CAD, CKD stage III, HTN, HLD, OSA, who presented with worsening BLE swelling worse in the RLE X 2-3 weeks. He had been seen by PCP, placed on clindamycin without improvement in his symptoms. Denies any associated fevers.  Reports compliance with home Lasix and apixaban.  Reported to be chronically on doxycycline and Macrobid.    Assessment & Plan    Assessment and Plan:  Nonpurulent lower extremity cellulitis on the right in the setting of chronic venous stasis dermatitis/venous insufficiency Bilateral lower extremity Dopplers negative for DVT. Blood cultures have been negative so far. Completed 5 days of IV ancef.  Therapy eval recommending SNF, currently waiting for SNF.     Mild acute on chronic diastolic heart failure Elevated BNP on admission and with bilateral lower extremity edema. Echocardiogram showed preserved left ventricular ejection fraction with no regional wall motion abnormalities, has grade 1 diastolic dysfunction.  Received IV Lasix with good diuresis. Stopped the lasix, as he became hypotensive with therapy . Continue with strict intake and output and daily weights    Stage IIIa CKD Creatinine appears to be at baseline.    Paroxysmal atrial fibrillation Currently not in any rate controlling agents.  Patient is on Eliquis continue the same.   Essential hypertension He became hypotensive while on lasix and losartan, which were discontinued.  Bolus of RL, with improvement in the BP parameters.   Acute encephalopathy of unclear  etiology in the setting of worsening dementia.  His mental status improved once BP improved.  Ordered CT head without contrast further evaluation. Discussed with Radiology, no signs of acute stroke or hemorrhage on CT.  MRI brain without contrast is negative for acute stroke, but shows moderate to advanced chronic small-vessel ischemic change  Discussed the plan with the daughter.   Alzheimer's dementia:  Continue with aricept.   Fever, hypotensive, tachycardia / SIRS  Resolved.   LACTIC acid wnl. Fever curve resolved.  Get UA and urine cultures, CXR is unremarkable. Blood cultures done and negative so far.  Pt reports severe right shoulder pain.  Daughter at bedside reports having chronic shoulder issues.  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 Discussed with Dr Aundria Rud with orthopedics recommends outpatient follow up int he office for intraarticular steroid injection vs IR guided injection inpatient.  IR consult placed.     In view of his progressive decline, multiple medical issues, palliative care consulted for goals of care.    Estimated body mass index is 28.79 kg/m as calculated from the following:   Height as of this encounter: 5\' 10"  (1.778 m).   Weight as of this encounter: 91 kg.  Code Status: DNR- intervention.  No CPR or chest compressions, . May intubate use advanced airway interventions and cardioversion/ACLS medications if appropriate or indicated. May transfer to ICU.  DVT Prophylaxis:   apixaban (ELIQUIS) tablet 5 mg   Level of Care: Level of care: Med-Surg Family Communication: none at bedside.   Disposition Plan:     Remains inpatient appropriate: fever , plan for SNF when medically stable,   Procedures:  MRI brain without contrast   Consultants:   Palliative care.  Curb side with orthopedics.   Antimicrobials:   Anti-infectives (From admission, onward)    Start     Dose/Rate Route Frequency Ordered Stop   11/15/22  0700  nitrofurantoin (macrocrystal-monohydrate) (MACROBID) capsule 100 mg        100 mg Oral Every morning 11/14/22 1428     11/14/22 1430  ceFAZolin (ANCEF) IVPB 2g/100 mL premix        2 g 200 mL/hr over 30 Minutes Intravenous Every 8 hours 11/14/22 1339 11/18/22 2134   11/14/22 0800  doxycycline (VIBRA-TABS) tablet 100 mg  Status:  Discontinued        100 mg Oral Every 24 hours 11/14/22 0317 11/14/22 1339   11/14/22 0230  vancomycin (VANCOCIN) IVPB 1000 mg/200 mL premix  Status:  Discontinued       Placed in "Followed by" Linked Group   1,000 mg 200 mL/hr over 60 Minutes Intravenous  Once 11/14/22 0112 11/14/22 1339   11/14/22 0115  vancomycin (VANCOCIN) IVPB 1000 mg/200 mL premix       Placed in "Followed by" Linked Group   1,000 mg 200 mL/hr over 60 Minutes Intravenous  Once 11/14/22 0112 11/14/22 0700   11/13/22 2315  clindamycin (CLEOCIN) IVPB 600 mg        600 mg 100 mL/hr over 30 Minutes Intravenous  Once 11/13/22 2314 11/14/22 0037        Medications  Scheduled Meds:  apixaban  5 mg Oral BID   diclofenac Sodium  4 g Topical QID   donepezil  10 mg Oral QHS   fesoterodine  8 mg Oral Daily   mirabegron ER  50 mg Oral q morning   nitrofurantoin (macrocrystal-monohydrate)  100 mg Oral q AM   rosuvastatin  5 mg Oral QODAY   Continuous Infusions:   PRN Meds:.acetaminophen, ketorolac, methocarbamol    Subjective:   [name] [name] was seen and examined today. No chest pain or sob, still with persistent right shoulder pain.   Objective:   Vitals:   11/19/22 2129 11/20/22 0445 11/20/22 0759 11/20/22 1424  BP:  122/71 (!) 140/90 (!) 149/74  Pulse: 80 72 81 87  Resp: 18 17 18 17   Temp:   98 F (36.7 C) 98.3 F (36.8 C)  TempSrc:   Oral Oral  SpO2: 96% 95% 95% 97%  Weight:      Height:        Intake/Output Summary (Last 24 hours) at 11/20/2022 1559 Last data filed at 11/20/2022 0528 Gross per 24 hour  Intake 240 ml  Output 500 ml  Net -260 ml   Filed  Weights   11/14/22 2300 11/15/22 0105 11/16/22 0500  Weight: 89.2 kg 89.2 kg 91 kg     Exam General exam: Appears calm and comfortable  Respiratory system: Clear to auscultation. Respiratory effort normal. Cardiovascular system: S1 & S2 heard, RRR Gastrointestinal system: Abdomen is nondistended, soft and nontender. Central nervous system: Alert and oriented to person.  Extremities: chronic venous stasis changes.  Right shoulder pain on movement.   Skin: No rashes,  Psychiatry: Mood & affect appropriate.      Data Reviewed:  I have personally reviewed following labs and imaging studies   CBC Lab Results  Component Value Date   WBC 8.8 11/18/2022   RBC 4.33 11/18/2022   HGB 13.9 11/18/2022   HCT 41.1 11/18/2022   MCV 94.9 11/18/2022   MCH 32.1 11/18/2022   PLT 253 11/18/2022   MCHC 33.8 11/18/2022   RDW 13.0 11/18/2022   LYMPHSABS 0.8 11/18/2022   MONOABS 1.0 11/18/2022   EOSABS 0.1 11/18/2022   BASOSABS 0.0 11/18/2022     Last metabolic panel Lab Results  Component Value Date   NA 134 (L) 11/18/2022   K 3.9 11/18/2022   CL 99 11/18/2022   CO2 23 11/18/2022   BUN 26 (H) 11/18/2022   CREATININE 1.19 11/18/2022   GLUCOSE 166 (H) 11/18/2022   GFRNONAA >60 11/18/2022   GFRAA >60 09/15/2018   CALCIUM 9.3 11/18/2022   PHOS 3.5 11/14/2022   PROT 6.6 11/13/2022   ALBUMIN 3.9 11/13/2022   BILITOT 0.5 11/13/2022   ALKPHOS 61 11/13/2022   AST 23 11/13/2022   ALT 26 11/13/2022   ANIONGAP 12 11/18/2022    CBG (last 3)  No results for input(s): "GLUCAP" in the last 72 hours.     Coagulation Profile: No results for input(s): "INR", "PROTIME" in the last 168 hours.   Radiology Studies: DG CHEST PORT 1 VIEW  Result Date: 11/18/2022 CLINICAL DATA:  Fever EXAM: PORTABLE CHEST 1 VIEW COMPARISON:  None Available. FINDINGS: Lungs are clear. No pneumothorax or pleural effusion. Cardiac size within normal limits. Pulmonary vascularity is normal. Osseous structures  are age-appropriate. Asymmetric degenerative changes noted within the right shoulder. No acute bone abnormality. IMPRESSION: No active disease. Electronically Signed   By: Helyn Numbers M.D.   On: 11/18/2022 20:31       Kathlen Mody M.D. [facility] 11/20/2022, 3:59 PM  Available via Epic secure chat 7am-7pm After 7 pm, please refer to night coverage provider listed on amion.

## 2022-11-21 DIAGNOSIS — Z515 Encounter for palliative care: Secondary | ICD-10-CM | POA: Diagnosis not present

## 2022-11-21 DIAGNOSIS — R4182 Altered mental status, unspecified: Secondary | ICD-10-CM | POA: Diagnosis not present

## 2022-11-21 DIAGNOSIS — I739 Peripheral vascular disease, unspecified: Secondary | ICD-10-CM | POA: Diagnosis not present

## 2022-11-21 DIAGNOSIS — Z7189 Other specified counseling: Secondary | ICD-10-CM | POA: Diagnosis not present

## 2022-11-21 NOTE — Progress Notes (Signed)
Mobility Specialist: Progress Note   11/21/22 1601  Mobility  Activity Ambulated with assistance in hallway  Level of Assistance Contact guard assist, steadying assist  Assistive Device Front wheel walker  Distance Ambulated (ft) 200 ft  Activity Response Tolerated well  Mobility Referral Yes  $Mobility charge 1 Mobility  Mobility Specialist Start Time (ACUTE ONLY) 1418  Mobility Specialist Stop Time (ACUTE ONLY) 1437  Mobility Specialist Time Calculation (min) (ACUTE ONLY) 19 min    Pt was agreeable to mobility session - received ambulating in room by himself stating he was looking for his glasses. Had no complaints throughout session. Still CG for ambulation for safety, MinA for bed mobility to assist BLEs, and SV for transfers. Pt doing much better today - seems less confused and more like himself. Returned to room without fault. Left in bed with all needs met, call bell in reach.   Maurene Capes Mobility Specialist Please contact via SecureChat or Rehab office at [phone]

## 2022-11-21 NOTE — Progress Notes (Signed)
Patient's daughter requesting bed alarm be turned off so that she can assist patient with bathroom visits. Risks of ambulating without staff explained to patient and daughter, verbalized understanding. Daughter states she wishes to see how she feels assisting prior to discharge with home health. Bed alarm turned off.

## 2022-11-21 NOTE — Progress Notes (Addendum)
Physical Therapy Treatment Patient Details Name: [name] [name] MRN: [id number] DOB: [birth year] Today's Date: 11/21/2022   History of Present Illness Pt is an 82 y/o M presenting to ED on 9/15 with increased RLE edema, BLE ultrasound negative for DVT. Admitted for RLE cellulitis. PMH includes dementia, A fib on anticoagulation, venous insufficiency in BLE, diastolic heart failure, CAD, CKD III, HTN, HLD, OSA    PT Comments  Continuing work on functional mobility and activity tolerance;  Much better today, and able to walk in the hallway with min assist; Pt's family member is wondering about possibly getting him home; In considering options for discharge, I value going back to familiar environment, caregivers, and routines for patients with dementia; Would like to see consistency over next few PT/OT/Mobility sessions, but am happy to update to home with HHPT/OT;   Pt's family member is also slightly hesitant (given how rough the end of the week was last week); she wants to be sure that she can manage safely with him at home; I look forward to seeing how he performs with OT -- performance with ADLs factors in heavily to decision-making re: getting home    If plan is discharge home, recommend the following: A little help with walking and/or transfers;A lot of help with bathing/dressing/bathroom;Supervision due to cognitive status   Can travel by private vehicle      (possibly -- would like to see more consistent performance)  Equipment Recommendations  Rolling walker (2 wheels);BSC/3in1;Wheelchair (measurements PT);Wheelchair cushion (measurements PT)    Recommendations for Other Services OT consult (on board)     Precautions / Restrictions Precautions Precautions: Fall Precaution Comments: watch for signs of hypotension Restrictions Other Position/Activity Restrictions: RUE pain with movement.     Mobility  Bed Mobility               General bed mobility comments: in recliner     Transfers Overall transfer level: Needs assistance Equipment used: Rolling walker (2 wheels) Transfers: Sit to/from Stand Sit to Stand: Min assist           General transfer comment: Min assist and manual cues for forward lean; min assist to steady rW    Ambulation/Gait Ambulation/Gait assistance: Contact guard assist Gait Distance (Feet): 100 Feet Assistive device: Rolling walker (2 wheels) Gait Pattern/deviations: Step-through pattern, Step-to pattern, Decreased stride length, Trunk flexed       General Gait Details: Walked with chair follow for safety; upper trunk flexed most of walk, able to straighten up briefly with cues; noted fatigue onset at about 80 ft; much improved from last PT session   Stairs             Wheelchair Mobility     Tilt Bed    Modified Rankin (Stroke Patients Only)       Balance     Sitting balance-Leahy Scale: Fair Sitting balance - Comments: unsupported sitting at edge of chair     Standing balance-Leahy Scale:  (approaching Fair) Standing balance comment: no heavy L lean noted                            Cognition Arousal: Alert Behavior During Therapy: WFL for tasks assessed/performed Overall Cognitive Status: History of cognitive impairments - at baseline                                 General  Comments: history of dementia        Exercises      General Comments General comments (skin integrity, edema, etc.): no signs of hypotension      Pertinent Vitals/Pain Pain Assessment Pain Assessment: Faces Faces Pain Scale: No hurt Pain Intervention(s): Monitored during session    Home Living                          Prior Function            PT Goals (current goals can now be found in the care plan section) Acute Rehab PT Goals Patient Stated Goal: to return to independent PT Goal Formulation: With patient Time For Goal Achievement: 11/29/22 Potential to Achieve Goals:  Good Progress towards PT goals: Progressing toward goals    Frequency    Min 1X/week      PT Plan      Co-evaluation              AM-PAC PT "6 Clicks" Mobility   Outcome Measure  Help needed turning from your back to your side while in a flat bed without using bedrails?: A Little Help needed moving from lying on your back to sitting on the side of a flat bed without using bedrails?: A Lot Help needed moving to and from a bed to a chair (including a wheelchair)?: A Little Help needed standing up from a chair using your arms (e.g., wheelchair or bedside chair)?: A Little Help needed to walk in hospital room?: A Little Help needed climbing 3-5 steps with a railing? : A Lot 6 Click Score: 16    End of Session Equipment Utilized During Treatment: Gait belt Activity Tolerance: Patient tolerated treatment well Patient left: in chair;with call bell/phone within reach;with family/visitor present Nurse Communication: Mobility status PT Visit Diagnosis: Other abnormalities of gait and mobility (R26.89);Muscle weakness (generalized) (M62.81)     Time: 4782-9562 PT Time Calculation (min) (ACUTE ONLY): 20 min  Charges:    $Gait Training: 8-22 mins PT General Charges $$ ACUTE PT VISIT: 1 Visit                     Van Clines, PT  Acute Rehabilitation Services Office [phone] Secure Chat welcomed    Levi Aland 11/21/2022, 10:14 AM

## 2022-11-21 NOTE — TOC Progression Note (Signed)
Transition of Care [facility]) - Progression Note    Patient Details  Name: [name] [name] MRN: [id number] Date of Birth: 03-10-1940  Transition of Care [facility]) CM/SW Contact  Lorri Frederick, LCSW Phone Number: 11/21/2022, 12:04 PM  Clinical Narrative:    CSW spoke with [facility].  They cannot offer bed.   CSW met with pt and [name] regarding bed offers.  Pt doing better with PT, daughter would like to move towards DC plan of home with [facility].    RNCM/MD informed.    Expected Discharge Plan: Skilled Nursing Facility Barriers to Discharge: Continued Medical Work up, SNF Pending bed offer  Expected Discharge Plan and Services In-house Referral: Clinical Social Work   Post Acute Care Choice: Skilled Nursing Facility Living arrangements for the past 2 months: Single Family Home                                       Social Determinants of Health (SDOH) Interventions SDOH Screenings   Food Insecurity: No Food Insecurity (11/14/2022)  Housing: Low Risk  (11/14/2022)  Transportation Needs: No Transportation Needs (11/14/2022)  Utilities: Not At Risk (11/14/2022)  Depression (PHQ2-9): Low Risk  (08/10/2020)  Financial Resource Strain: Low Risk  (09/27/2022)   Received from [facility]  Physical Activity: Insufficiently Active (09/27/2022)   Received from [facility]  Social Connections: Socially Integrated (09/27/2022)   Received from [facility]  Stress: No Stress Concern Present (09/27/2022)   Received from [facility]  Tobacco Use: Medium Risk (11/13/2022)    Readmission Risk Interventions     No data to display

## 2022-11-21 NOTE — Progress Notes (Signed)
[facility]                                                                               [name] [name], is a 82 y.o. male, DOB - 1941-01-11, GMW:[id number] Admit date - 11/13/2022    Outpatient Primary MD for the patient is [name] Negus, NP  LOS - 7  days    Brief summary   [name] [name] is a 82 y.o. male with hx of Alzheimer's dementia, A-fib on AC, BLE venous insufficiency, diastolic heart failure, CAD, CKD stage III, HTN, HLD, OSA, who presented with worsening BLE swelling worse in the RLE X 2-3 weeks. He had been seen by PCP, placed on clindamycin without improvement in his symptoms. Denies any associated fevers.  Reports compliance with home Lasix and apixaban.  Reported to be chronically on doxycycline and Macrobid.    Assessment & Plan    Assessment and Plan:  Nonpurulent lower extremity cellulitis on the right in the setting of chronic venous stasis dermatitis/venous insufficiency Bilateral lower extremity Dopplers negative for DVT. Blood cultures have been negative so far. Completed 5 days of IV ancef.  Therapy eval recommending SNF, currently waiting for SNF.     Mild acute on chronic diastolic heart failure Elevated BNP on admission and with bilateral lower extremity edema. Echocardiogram showed preserved left ventricular ejection fraction with no regional wall motion abnormalities, has grade 1 diastolic dysfunction.  Received IV Lasix with good diuresis. Stopped the lasix, as he became hypotensive with therapy . Continue with strict intake and output and daily weights    Stage IIIa CKD Creatinine appears to be at baseline.    Paroxysmal atrial fibrillation Currently not in any rate controlling agents.  Patient is on Eliquis continue the same.   Essential hypertension He became hypotensive while on lasix and losartan, which were discontinued.  Bolus of RL, with improvement in the BP parameters.   Acute encephalopathy of unclear  etiology in the setting of worsening dementia.  His mental status improved once BP improved.  Ordered CT head without contrast further evaluation. Discussed with Radiology, no signs of acute stroke or hemorrhage on CT.  MRI brain without contrast is negative for acute stroke, but shows moderate to advanced chronic small-vessel ischemic change  Discussed the plan with the daughter.   Alzheimer's dementia:  Continue with aricept.   Fever, hypotensive, tachycardia / SIRS  Resolved.   LACTIC acid wnl. Fever curve resolved.  Get UA and urine cultures, CXR is unremarkable. Blood cultures done and negative so far.  Pt reports severe right shoulder pain.  Daughter at bedside reports having chronic shoulder issues.  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 Discussed with Dr Aundria Rud with orthopedics recommends outpatient follow up int he office for intraarticular steroid injection vs IR guided injection inpatient.  IR consult placed. Awaiting for IR GUIDED intra articular steroid injection. .    In view of his progressive decline, multiple medical issues, palliative care consulted for goals of care. Therapy evaluations recommending HH VS snf. Family wanted to go home.    Estimated body mass index is 28.79 kg/m as calculated from the following:  Height as of this encounter: 5\' 10"  (1.778 m).   Weight as of this encounter: 91 kg.  Code Status: DNR- intervention.  No CPR or chest compressions, . May intubate use advanced airway interventions and cardioversion/ACLS medications if appropriate or indicated. May transfer to ICU.  DVT Prophylaxis:  Place TED hose Start: 11/21/22 1134 apixaban (ELIQUIS) tablet 5 mg   Level of Care: Level of care: Med-Surg Family Communication: none at bedside.   Disposition Plan:     Remains inpatient appropriate: Home with home health.   Procedures:  MRI brain without contrast   Consultants:   Palliative care.  Curb side with  orthopedics.   Antimicrobials:   Anti-infectives (From admission, onward)    Start     Dose/Rate Route Frequency Ordered Stop   11/15/22 0700  nitrofurantoin (macrocrystal-monohydrate) (MACROBID) capsule 100 mg        100 mg Oral Every morning 11/14/22 1428     11/14/22 1430  ceFAZolin (ANCEF) IVPB 2g/100 mL premix        2 g 200 mL/hr over 30 Minutes Intravenous Every 8 hours 11/14/22 1339 11/18/22 2134   11/14/22 0800  doxycycline (VIBRA-TABS) tablet 100 mg  Status:  Discontinued        100 mg Oral Every 24 hours 11/14/22 0317 11/14/22 1339   11/14/22 0230  vancomycin (VANCOCIN) IVPB 1000 mg/200 mL premix  Status:  Discontinued       Placed in "Followed by" Linked Group   1,000 mg 200 mL/hr over 60 Minutes Intravenous  Once 11/14/22 0112 11/14/22 1339   11/14/22 0115  vancomycin (VANCOCIN) IVPB 1000 mg/200 mL premix       Placed in "Followed by" Linked Group   1,000 mg 200 mL/hr over 60 Minutes Intravenous  Once 11/14/22 0112 11/14/22 0700   11/13/22 2315  clindamycin (CLEOCIN) IVPB 600 mg        600 mg 100 mL/hr over 30 Minutes Intravenous  Once 11/13/22 2314 11/14/22 0037        Medications  Scheduled Meds:  apixaban  5 mg Oral BID   diclofenac Sodium  4 g Topical QID   donepezil  10 mg Oral QHS   fesoterodine  8 mg Oral Daily   mirabegron ER  50 mg Oral q morning   nitrofurantoin (macrocrystal-monohydrate)  100 mg Oral q AM   rosuvastatin  5 mg Oral QODAY   Continuous Infusions:   PRN Meds:.acetaminophen, methocarbamol    Subjective:   [name] [name] was seen and examined today.  No new complaints.   Objective:   Vitals:   11/20/22 1424 11/20/22 1940 11/21/22 0553 11/21/22 0752  BP: (!) 149/74 128/82 127/71 119/65  Pulse: 87 74 69 71  Resp: 17  18 18   Temp: 98.3 F (36.8 C) 98.2 F (36.8 C) 98.4 F (36.9 C) 98 F (36.7 C)  TempSrc: Oral Oral  Oral  SpO2: 97% 99% 97% 98%  Weight:      Height:        Intake/Output Summary (Last 24 hours) at  11/21/2022 1608 Last data filed at 11/20/2022 2254 Gross per 24 hour  Intake --  Output 300 ml  Net -300 ml   Filed Weights   11/14/22 2300 11/15/22 0105 11/16/22 0500  Weight: 89.2 kg 89.2 kg 91 kg     Exam General exam: Appears calm and comfortable  Respiratory system: Clear to auscultation. Respiratory effort normal. Cardiovascular system: S1 & S2 heard, RRR. No JVD,  Gastrointestinal system: Abdomen  is nondistended, soft and nontender.  Central nervous system: Alert and oriented to person and place.  Extremities:  right shoulder tenderness.  Skin: No rashes,  Psychiatry: Mood is appropriate.       Data Reviewed:  I have personally reviewed following labs and imaging studies   CBC Lab Results  Component Value Date   WBC 8.8 11/18/2022   RBC 4.33 11/18/2022   HGB 13.9 11/18/2022   HCT 41.1 11/18/2022   MCV 94.9 11/18/2022   MCH 32.1 11/18/2022   PLT 253 11/18/2022   MCHC 33.8 11/18/2022   RDW 13.0 11/18/2022   LYMPHSABS 0.8 11/18/2022   MONOABS 1.0 11/18/2022   EOSABS 0.1 11/18/2022   BASOSABS 0.0 11/18/2022     Last metabolic panel Lab Results  Component Value Date   NA 134 (L) 11/18/2022   K 3.9 11/18/2022   CL 99 11/18/2022   CO2 23 11/18/2022   BUN 26 (H) 11/18/2022   CREATININE 1.19 11/18/2022   GLUCOSE 166 (H) 11/18/2022   GFRNONAA >60 11/18/2022   GFRAA >60 09/15/2018   CALCIUM 9.3 11/18/2022   PHOS 3.5 11/14/2022   PROT 6.6 11/13/2022   ALBUMIN 3.9 11/13/2022   BILITOT 0.5 11/13/2022   ALKPHOS 61 11/13/2022   AST 23 11/13/2022   ALT 26 11/13/2022   ANIONGAP 12 11/18/2022    CBG (last 3)  No results for input(s): "GLUCAP" in the last 72 hours.     Coagulation Profile: No results for input(s): "INR", "PROTIME" in the last 168 hours.   Radiology Studies: No results found.     Kathlen Mody M.D. [facility] 11/21/2022, 4:08 PM  Available via Epic secure chat 7am-7pm After 7 pm, please refer to night coverage provider  listed on amion.

## 2022-11-21 NOTE — Progress Notes (Signed)
   11/21/22 1706  Spiritual Encounters  Type of Visit Initial  Care provided to: Patient  Reason for visit Routine spiritual support  OnCall Visit No  Spiritual Framework  Community/Connection Family  Patient Stress Factors None identified   The patient's daughter requested we visit with her dad. I listened as the patient recounted why he in the hospital. He was waiting to hear if he is going home today. He was confused about what is going to happen but resigned to staying one more night. I listed as he talked about doing his puzzle book. I learned we are neighbors in [place]. We talked about his life in [place] and in [place].  He struggles with memory loss and knows he can't recall.  This frustrates him but he says he deals with it because he must.  I put his dinner on his cart and got him settled for his meal.  [name] [name]

## 2022-11-22 ENCOUNTER — Inpatient Hospital Stay (HOSPITAL_COMMUNITY): Payer: Medicare Other

## 2022-11-22 DIAGNOSIS — I739 Peripheral vascular disease, unspecified: Secondary | ICD-10-CM | POA: Diagnosis not present

## 2022-11-22 DIAGNOSIS — L03115 Cellulitis of right lower limb: Secondary | ICD-10-CM | POA: Diagnosis not present

## 2022-11-22 DIAGNOSIS — R600 Localized edema: Secondary | ICD-10-CM | POA: Diagnosis not present

## 2022-11-22 MED ORDER — METHYLPREDNISOLONE ACETATE 40 MG/ML IJ SUSP
INTRAMUSCULAR | Status: AC
  Filled 2022-11-22: qty 2

## 2022-11-22 MED ORDER — DICLOFENAC SODIUM 1 % EX GEL: 4.0000 g | Freq: Four times a day (QID) | CUTANEOUS | 0 refills | Status: AC

## 2022-11-22 MED ORDER — ACETAMINOPHEN 325 MG PO TABS: 650.0000 mg | ORAL_TABLET | Freq: Four times a day (QID) | ORAL | Status: AC | PRN

## 2022-11-22 MED ORDER — LIDOCAINE HCL (PF) 1 % IJ SOLN
5.0000 mL | Freq: Once | INTRAMUSCULAR | Status: AC
  Administered 2022-11-22: 1.5 mL

## 2022-11-22 MED ORDER — METHYLPREDNISOLONE ACETATE 40 MG/ML INJ SUSP (RADIOLOG
40.0000 mg | Freq: Once | INTRAMUSCULAR | Status: AC
  Administered 2022-11-22: 40 mg via INTRA_ARTICULAR

## 2022-11-22 MED ORDER — BUPIVACAINE HCL (PF) 0.25 % IJ SOLN
INTRAMUSCULAR | Status: AC
  Filled 2022-11-22: qty 30

## 2022-11-22 MED ORDER — IOHEXOL 180 MG/ML  SOLN
10.0000 mL | Freq: Once | INTRAMUSCULAR | Status: AC | PRN
  Administered 2022-11-22: 4 mL via INTRA_ARTICULAR

## 2022-11-22 MED ORDER — BUPIVACAINE HCL (PF) 0.25 % IJ SOLN
30.0000 mL | Freq: Once | INTRAMUSCULAR | Status: AC
  Administered 2022-11-22: 5 mL via INTRA_ARTICULAR

## 2022-11-22 NOTE — Progress Notes (Signed)
Discharge instructions given. Patient verbalized understanding and all questions were answered.  ?

## 2022-11-22 NOTE — Plan of Care (Signed)
  Problem: Education: Goal: Knowledge of General Education information will improve Description Including pain rating scale, medication(s)/side effects and non-pharmacologic comfort measures Outcome: Progressing   

## 2022-11-22 NOTE — Care Management Important Message (Signed)
Important Message  Patient Details  Name: ANTRAVIOUS DHINGRA MRN: [id number] Date of Birth: 02-Dec-1940   Important Message Given:  Yes - Medicare IM     Sherilyn Banker 11/22/2022, 3:16 PM

## 2022-11-22 NOTE — Progress Notes (Signed)
Physical Therapy Treatment Patient Details Name: [name] [name] MRN: [id number] DOB: [birth year] Today's Date: 11/22/2022   History of Present Illness Pt is an 82 y/o M presenting to ED on 9/15 with increased RLE edema, BLE ultrasound negative for DVT. Admitted for RLE cellulitis. PMH includes dementia, A fib on anticoagulation, venous insufficiency in BLE, diastolic heart failure, CAD, CKD III, HTN, HLD, OSA    PT Comments  Continuing work on functional mobility and activity tolerance;  Session focused on progressive amb, and pt is showing consistency of performance; Walked what is typically considered more than household distances today and yesterday, practiced a single step with RW; Agree with dc home, and overall OK for dc home from PT standpoint; Rec HHPT follow up (pt is interested in getting back to walking outside)    If plan is discharge home, recommend the following: A little help with walking and/or transfers;A lot of help with bathing/dressing/bathroom;Supervision due to cognitive status   Can travel by private vehicle     Yes  Equipment Recommendations  Rolling walker (2 wheels);BSC/3in1    Recommendations for Other Services       Precautions / Restrictions Precautions Precautions: Fall Precaution Comments: watch for signs of hypotension; no presyncopal symptoms 9/24 Restrictions Other Position/Activity Restrictions: RUE pain with movement.     Mobility  Bed Mobility               General bed mobility comments: in recliner    Transfers Overall transfer level: Needs assistance Equipment used: Rolling walker (2 wheels) Transfers: Sit to/from Stand Sit to Stand: Contact guard assist           General transfer comment: Increased time; cues to scoot closer to edge of chair and to push up from bil armrests; Heavy UE use to stand from recliner seat    Ambulation/Gait Ambulation/Gait assistance: Contact guard assist Gait Distance (Feet): 180  Feet Assistive device: Rolling walker (2 wheels) Gait Pattern/deviations: Step-through pattern, Step-to pattern, Decreased stride length, Trunk flexed Gait velocity: decreased     General Gait Details: Walked in the hallway with RW; Able to engage in conversation during amb; Discussed leaves changing; he likes the Spring; seemed pleased to walk   Stairs Stairs: Yes Stairs assistance: Contact guard assist Stair Management: No rails, With walker, Forwards Number of Stairs: 1 General stair comments: Want up and down one step with RW; heavy, step-by-step cues; Once done, pt stated "I don't want to do that again today"; pt smiled when this PT added, "maybe up another step to get into your house?"   Wheelchair Mobility     Tilt Bed    Modified Rankin (Stroke Patients Only)       Balance     Sitting balance-Leahy Scale: Fair       Standing balance-Leahy Scale: Poor (approaching Fair)                              Cognition Arousal: Alert Behavior During Therapy: WFL for tasks assessed/performed Overall Cognitive Status: History of cognitive impairments - at baseline                                 General Comments: history of dementia        Exercises      General Comments General comments (skin integrity, edema, etc.): NAD with walking; managing household distances with  RW      Pertinent Vitals/Pain Pain Assessment Pain Assessment: Faces Faces Pain Scale: Hurts a little bit Pain Location: Reports bil LE discomfort, and buttocks are sore Pain Descriptors / Indicators: Discomfort Pain Intervention(s): Monitored during session, Other (comment) (placed pillow in recliner seat)    Home Living                          Prior Function            PT Goals (current goals can now be found in the care plan section) Acute Rehab PT Goals Patient Stated Goal: Hopes to get home today PT Goal Formulation: With patient Time For Goal  Achievement: 11/29/22 Potential to Achieve Goals: Good Progress towards PT goals: Progressing toward goals    Frequency    Min 1X/week      PT Plan      Co-evaluation              AM-PAC PT "6 Clicks" Mobility   Outcome Measure  Help needed turning from your back to your side while in a flat bed without using bedrails?: None Help needed moving from lying on your back to sitting on the side of a flat bed without using bedrails?: A Little Help needed moving to and from a bed to a chair (including a wheelchair)?: A Little Help needed standing up from a chair using your arms (e.g., wheelchair or bedside chair)?: A Little Help needed to walk in hospital room?: A Little Help needed climbing 3-5 steps with a railing? : A Lot 6 Click Score: 18    End of Session Equipment Utilized During Treatment: Gait belt Activity Tolerance: Patient tolerated treatment well Patient left: in chair;with call bell/phone within reach;with chair alarm set Nurse Communication: Mobility status;Other (comment) (Asked Nursing secretary to order  more AA batteries for chair alarm boxes) PT Visit Diagnosis: Other abnormalities of gait and mobility (R26.89);Muscle weakness (generalized) (M62.81)     Time: 0630-1601 PT Time Calculation (min) (ACUTE ONLY): 29 min  Charges:    $Gait Training: 23-37 mins PT General Charges $$ ACUTE PT VISIT: 1 Visit                     Van Clines, PT  Acute Rehabilitation Services Office [phone] Secure Chat welcomed    [name] [name] 11/22/2022, 10:09 AM

## 2022-11-22 NOTE — Discharge Summary (Signed)
POP      Full           Yes      Yes                                 +---------+---------------+---------+-----------+----------+--------------+ PTV      Full                                                        +---------+---------------+---------+-----------+----------+--------------+ PERO     Full                                                        +---------+---------------+---------+-----------+----------+--------------+   +----+---------------+---------+-----------+----------+--------------+ LEFTCompressibilityPhasicitySpontaneityPropertiesThrombus Aging +----+---------------+---------+-----------+----------+--------------+ CFV Full           Yes      Yes                                 +----+---------------+---------+-----------+----------+--------------+     Summary: RIGHT: - There is no evidence of deep vein thrombosis in the lower extremity.  - No cystic structure found in the popliteal fossa.  LEFT: - No evidence of common femoral vein obstruction.   *See table(s) above for measurements and observations. Electronically signed by Waverly Ferrari MD on 11/15/2022 at 7:37:54 AM.    Final    ECHOCARDIOGRAM COMPLETE  Result Date: 11/14/2022    ECHOCARDIOGRAM REPORT   Patient Name:   GEOVANNIE MACNAB Date of Exam: 11/14/2022 Medical Rec #:  [id number]       Height:       70.0 in Accession #:    [id number]      Weight:       209.4 lb Date of  Birth:  [birth year]       BSA:          2.129 m Patient Age:    82 years        BP:           151/77 mmHg Patient Gender: M               HR:           65 bpm. Exam Location:  Inpatient Procedure: 2D Echo, Cardiac Doppler, Color Doppler and Intracardiac            Opacification Agent Indications:    Congestive Heart Failure I50.9  History:        Patient has prior history of Echocardiogram examinations, most                 recent 01/14/2022. CHF, CAD, Signs/Symptoms:Edema; Risk                 Factors:Former Smoker.  Sonographer:    Dondra Prader RVT RCS Referring Phys: [id number] [facility]  Sonographer Comments: Technically challenging study due to limited acoustic windows, Technically difficult study due to poor echo windows, suboptimal parasternal window, suboptimal apical window and suboptimal subcostal window. Image acquisition challenging due to respiratory motion. IMPRESSIONS  1. Left ventricular ejection fraction, by  No mass lesion. Vascular: No hyperdense vessel or unexpected calcification. Skull: Normal. Negative for fracture or focal lesion. Sinuses/Orbits: No middle ear or mastoid effusion. Paranasal sinuses are clear. Bilateral lens replacement. Orbits are otherwise unremarkable. Other: None. IMPRESSION: No hemorrhage or CT  evidence of an acute cortical infarct. If there is high clinical concern for an acute infarct, further evaluation with a brain MRi is recommended. Electronically Signed   By: Lorenza Cambridge M.D.   On: 11/17/2022 14:26   VAS [place] LOWER EXTREMITY VENOUS (DVT)  Result Date: 11/15/2022  Lower Venous DVT Study Patient Name:  DARELLE ANELLO  Date of Exam:   11/14/2022 Medical Rec #: [id number]        Accession #:    [id number] Date of Birth: May 12, 1940        Patient Gender: M Patient Age:   19 years Exam Location:  [facility] Procedure:      VAS [place] LOWER EXTREMITY VENOUS (DVT) Referring Phys: Dolly Rias --------------------------------------------------------------------------------  Indications: Edema.  Risk Factors: Venous insufficiency. Anticoagulation: Eliquis. Comparison Study: No significant changes seen since previous exam 09/27/18 Performing Technologist: Shona Simpson  Examination Guidelines: A complete evaluation includes B-mode imaging, spectral Doppler, color Doppler, and power Doppler as needed of all accessible portions of each vessel. Bilateral testing is considered an integral part of a complete examination. Limited examinations for reoccurring indications may be performed as noted. The reflux portion of the exam is performed with the patient in reverse Trendelenburg.  +---------+---------------+---------+-----------+----------+--------------+ RIGHT    CompressibilityPhasicitySpontaneityPropertiesThrombus Aging +---------+---------------+---------+-----------+----------+--------------+ CFV      Full           Yes      Yes                                 +---------+---------------+---------+-----------+----------+--------------+ SFJ      Full                                                        +---------+---------------+---------+-----------+----------+--------------+ FV Prox  Full                                                         +---------+---------------+---------+-----------+----------+--------------+ FV Mid   Full                                                        +---------+---------------+---------+-----------+----------+--------------+ FV DistalFull                                                        +---------+---------------+---------+-----------+----------+--------------+ PFV      Full                                                        +---------+---------------+---------+-----------+----------+--------------+  POP      Full           Yes      Yes                                 +---------+---------------+---------+-----------+----------+--------------+ PTV      Full                                                        +---------+---------------+---------+-----------+----------+--------------+ PERO     Full                                                        +---------+---------------+---------+-----------+----------+--------------+   +----+---------------+---------+-----------+----------+--------------+ LEFTCompressibilityPhasicitySpontaneityPropertiesThrombus Aging +----+---------------+---------+-----------+----------+--------------+ CFV Full           Yes      Yes                                 +----+---------------+---------+-----------+----------+--------------+     Summary: RIGHT: - There is no evidence of deep vein thrombosis in the lower extremity.  - No cystic structure found in the popliteal fossa.  LEFT: - No evidence of common femoral vein obstruction.   *See table(s) above for measurements and observations. Electronically signed by Waverly Ferrari MD on 11/15/2022 at 7:37:54 AM.    Final    ECHOCARDIOGRAM COMPLETE  Result Date: 11/14/2022    ECHOCARDIOGRAM REPORT   Patient Name:   GEOVANNIE MACNAB Date of Exam: 11/14/2022 Medical Rec #:  [id number]       Height:       70.0 in Accession #:    [id number]      Weight:       209.4 lb Date of  Birth:  [birth year]       BSA:          2.129 m Patient Age:    82 years        BP:           151/77 mmHg Patient Gender: M               HR:           65 bpm. Exam Location:  Inpatient Procedure: 2D Echo, Cardiac Doppler, Color Doppler and Intracardiac            Opacification Agent Indications:    Congestive Heart Failure I50.9  History:        Patient has prior history of Echocardiogram examinations, most                 recent 01/14/2022. CHF, CAD, Signs/Symptoms:Edema; Risk                 Factors:Former Smoker.  Sonographer:    Dondra Prader RVT RCS Referring Phys: [id number] [facility]  Sonographer Comments: Technically challenging study due to limited acoustic windows, Technically difficult study due to poor echo windows, suboptimal parasternal window, suboptimal apical window and suboptimal subcostal window. Image acquisition challenging due to respiratory motion. IMPRESSIONS  1. Left ventricular ejection fraction, by  POP      Full           Yes      Yes                                 +---------+---------------+---------+-----------+----------+--------------+ PTV      Full                                                        +---------+---------------+---------+-----------+----------+--------------+ PERO     Full                                                        +---------+---------------+---------+-----------+----------+--------------+   +----+---------------+---------+-----------+----------+--------------+ LEFTCompressibilityPhasicitySpontaneityPropertiesThrombus Aging +----+---------------+---------+-----------+----------+--------------+ CFV Full           Yes      Yes                                 +----+---------------+---------+-----------+----------+--------------+     Summary: RIGHT: - There is no evidence of deep vein thrombosis in the lower extremity.  - No cystic structure found in the popliteal fossa.  LEFT: - No evidence of common femoral vein obstruction.   *See table(s) above for measurements and observations. Electronically signed by Waverly Ferrari MD on 11/15/2022 at 7:37:54 AM.    Final    ECHOCARDIOGRAM COMPLETE  Result Date: 11/14/2022    ECHOCARDIOGRAM REPORT   Patient Name:   GEOVANNIE MACNAB Date of Exam: 11/14/2022 Medical Rec #:  [id number]       Height:       70.0 in Accession #:    [id number]      Weight:       209.4 lb Date of  Birth:  [birth year]       BSA:          2.129 m Patient Age:    82 years        BP:           151/77 mmHg Patient Gender: M               HR:           65 bpm. Exam Location:  Inpatient Procedure: 2D Echo, Cardiac Doppler, Color Doppler and Intracardiac            Opacification Agent Indications:    Congestive Heart Failure I50.9  History:        Patient has prior history of Echocardiogram examinations, most                 recent 01/14/2022. CHF, CAD, Signs/Symptoms:Edema; Risk                 Factors:Former Smoker.  Sonographer:    Dondra Prader RVT RCS Referring Phys: [id number] [facility]  Sonographer Comments: Technically challenging study due to limited acoustic windows, Technically difficult study due to poor echo windows, suboptimal parasternal window, suboptimal apical window and suboptimal subcostal window. Image acquisition challenging due to respiratory motion. IMPRESSIONS  1. Left ventricular ejection fraction, by  POP      Full           Yes      Yes                                 +---------+---------------+---------+-----------+----------+--------------+ PTV      Full                                                        +---------+---------------+---------+-----------+----------+--------------+ PERO     Full                                                        +---------+---------------+---------+-----------+----------+--------------+   +----+---------------+---------+-----------+----------+--------------+ LEFTCompressibilityPhasicitySpontaneityPropertiesThrombus Aging +----+---------------+---------+-----------+----------+--------------+ CFV Full           Yes      Yes                                 +----+---------------+---------+-----------+----------+--------------+     Summary: RIGHT: - There is no evidence of deep vein thrombosis in the lower extremity.  - No cystic structure found in the popliteal fossa.  LEFT: - No evidence of common femoral vein obstruction.   *See table(s) above for measurements and observations. Electronically signed by Waverly Ferrari MD on 11/15/2022 at 7:37:54 AM.    Final    ECHOCARDIOGRAM COMPLETE  Result Date: 11/14/2022    ECHOCARDIOGRAM REPORT   Patient Name:   GEOVANNIE MACNAB Date of Exam: 11/14/2022 Medical Rec #:  [id number]       Height:       70.0 in Accession #:    [id number]      Weight:       209.4 lb Date of  Birth:  [birth year]       BSA:          2.129 m Patient Age:    82 years        BP:           151/77 mmHg Patient Gender: M               HR:           65 bpm. Exam Location:  Inpatient Procedure: 2D Echo, Cardiac Doppler, Color Doppler and Intracardiac            Opacification Agent Indications:    Congestive Heart Failure I50.9  History:        Patient has prior history of Echocardiogram examinations, most                 recent 01/14/2022. CHF, CAD, Signs/Symptoms:Edema; Risk                 Factors:Former Smoker.  Sonographer:    Dondra Prader RVT RCS Referring Phys: [id number] [facility]  Sonographer Comments: Technically challenging study due to limited acoustic windows, Technically difficult study due to poor echo windows, suboptimal parasternal window, suboptimal apical window and suboptimal subcostal window. Image acquisition challenging due to respiratory motion. IMPRESSIONS  1. Left ventricular ejection fraction, by  POP      Full           Yes      Yes                                 +---------+---------------+---------+-----------+----------+--------------+ PTV      Full                                                        +---------+---------------+---------+-----------+----------+--------------+ PERO     Full                                                        +---------+---------------+---------+-----------+----------+--------------+   +----+---------------+---------+-----------+----------+--------------+ LEFTCompressibilityPhasicitySpontaneityPropertiesThrombus Aging +----+---------------+---------+-----------+----------+--------------+ CFV Full           Yes      Yes                                 +----+---------------+---------+-----------+----------+--------------+     Summary: RIGHT: - There is no evidence of deep vein thrombosis in the lower extremity.  - No cystic structure found in the popliteal fossa.  LEFT: - No evidence of common femoral vein obstruction.   *See table(s) above for measurements and observations. Electronically signed by Waverly Ferrari MD on 11/15/2022 at 7:37:54 AM.    Final    ECHOCARDIOGRAM COMPLETE  Result Date: 11/14/2022    ECHOCARDIOGRAM REPORT   Patient Name:   GEOVANNIE MACNAB Date of Exam: 11/14/2022 Medical Rec #:  [id number]       Height:       70.0 in Accession #:    [id number]      Weight:       209.4 lb Date of  Birth:  [birth year]       BSA:          2.129 m Patient Age:    82 years        BP:           151/77 mmHg Patient Gender: M               HR:           65 bpm. Exam Location:  Inpatient Procedure: 2D Echo, Cardiac Doppler, Color Doppler and Intracardiac            Opacification Agent Indications:    Congestive Heart Failure I50.9  History:        Patient has prior history of Echocardiogram examinations, most                 recent 01/14/2022. CHF, CAD, Signs/Symptoms:Edema; Risk                 Factors:Former Smoker.  Sonographer:    Dondra Prader RVT RCS Referring Phys: [id number] [facility]  Sonographer Comments: Technically challenging study due to limited acoustic windows, Technically difficult study due to poor echo windows, suboptimal parasternal window, suboptimal apical window and suboptimal subcostal window. Image acquisition challenging due to respiratory motion. IMPRESSIONS  1. Left ventricular ejection fraction, by  POP      Full           Yes      Yes                                 +---------+---------------+---------+-----------+----------+--------------+ PTV      Full                                                        +---------+---------------+---------+-----------+----------+--------------+ PERO     Full                                                        +---------+---------------+---------+-----------+----------+--------------+   +----+---------------+---------+-----------+----------+--------------+ LEFTCompressibilityPhasicitySpontaneityPropertiesThrombus Aging +----+---------------+---------+-----------+----------+--------------+ CFV Full           Yes      Yes                                 +----+---------------+---------+-----------+----------+--------------+     Summary: RIGHT: - There is no evidence of deep vein thrombosis in the lower extremity.  - No cystic structure found in the popliteal fossa.  LEFT: - No evidence of common femoral vein obstruction.   *See table(s) above for measurements and observations. Electronically signed by Waverly Ferrari MD on 11/15/2022 at 7:37:54 AM.    Final    ECHOCARDIOGRAM COMPLETE  Result Date: 11/14/2022    ECHOCARDIOGRAM REPORT   Patient Name:   GEOVANNIE MACNAB Date of Exam: 11/14/2022 Medical Rec #:  [id number]       Height:       70.0 in Accession #:    [id number]      Weight:       209.4 lb Date of  Birth:  [birth year]       BSA:          2.129 m Patient Age:    82 years        BP:           151/77 mmHg Patient Gender: M               HR:           65 bpm. Exam Location:  Inpatient Procedure: 2D Echo, Cardiac Doppler, Color Doppler and Intracardiac            Opacification Agent Indications:    Congestive Heart Failure I50.9  History:        Patient has prior history of Echocardiogram examinations, most                 recent 01/14/2022. CHF, CAD, Signs/Symptoms:Edema; Risk                 Factors:Former Smoker.  Sonographer:    Dondra Prader RVT RCS Referring Phys: [id number] [facility]  Sonographer Comments: Technically challenging study due to limited acoustic windows, Technically difficult study due to poor echo windows, suboptimal parasternal window, suboptimal apical window and suboptimal subcostal window. Image acquisition challenging due to respiratory motion. IMPRESSIONS  1. Left ventricular ejection fraction, by  No mass lesion. Vascular: No hyperdense vessel or unexpected calcification. Skull: Normal. Negative for fracture or focal lesion. Sinuses/Orbits: No middle ear or mastoid effusion. Paranasal sinuses are clear. Bilateral lens replacement. Orbits are otherwise unremarkable. Other: None. IMPRESSION: No hemorrhage or CT  evidence of an acute cortical infarct. If there is high clinical concern for an acute infarct, further evaluation with a brain MRi is recommended. Electronically Signed   By: Lorenza Cambridge M.D.   On: 11/17/2022 14:26   VAS [place] LOWER EXTREMITY VENOUS (DVT)  Result Date: 11/15/2022  Lower Venous DVT Study Patient Name:  DARELLE ANELLO  Date of Exam:   11/14/2022 Medical Rec #: [id number]        Accession #:    [id number] Date of Birth: May 12, 1940        Patient Gender: M Patient Age:   19 years Exam Location:  [facility] Procedure:      VAS [place] LOWER EXTREMITY VENOUS (DVT) Referring Phys: Dolly Rias --------------------------------------------------------------------------------  Indications: Edema.  Risk Factors: Venous insufficiency. Anticoagulation: Eliquis. Comparison Study: No significant changes seen since previous exam 09/27/18 Performing Technologist: Shona Simpson  Examination Guidelines: A complete evaluation includes B-mode imaging, spectral Doppler, color Doppler, and power Doppler as needed of all accessible portions of each vessel. Bilateral testing is considered an integral part of a complete examination. Limited examinations for reoccurring indications may be performed as noted. The reflux portion of the exam is performed with the patient in reverse Trendelenburg.  +---------+---------------+---------+-----------+----------+--------------+ RIGHT    CompressibilityPhasicitySpontaneityPropertiesThrombus Aging +---------+---------------+---------+-----------+----------+--------------+ CFV      Full           Yes      Yes                                 +---------+---------------+---------+-----------+----------+--------------+ SFJ      Full                                                        +---------+---------------+---------+-----------+----------+--------------+ FV Prox  Full                                                         +---------+---------------+---------+-----------+----------+--------------+ FV Mid   Full                                                        +---------+---------------+---------+-----------+----------+--------------+ FV DistalFull                                                        +---------+---------------+---------+-----------+----------+--------------+ PFV      Full                                                        +---------+---------------+---------+-----------+----------+--------------+

## 2022-11-22 NOTE — TOC Transition Note (Signed)
Transition of Care [facility]) - CM/SW Discharge Note   Patient Details  Name: [name] [name] MRN: [id number] Date of Birth: 19-Apr-1940  Transition of Care [facility]) CM/SW Contact:  Epifanio Lesches, RN Phone Number: 11/22/2022, 4:07 PM   Clinical Narrative:    Patient will DC to: home  Anticipated DC date: 11/22/2022 Family notified: yes Transport by: car   Per MD patient ready for DC today. RN, patient and patient's family notified of DC.   Home health orders noted. Pt agreeable to home health services, no provider preference. Referral made with [facility] and accepted. Post hospital f/u noted on AVS.  Pt without RX med concerns. Daughter to provide transportation to home.  RNCM will sign off for now as intervention is no longer needed. Please consult [place] again if new needs arise.    Final next level of care: Home w Home Health Services Barriers to Discharge: No Barriers Identified   Patient Goals and CMS Choice CMS Medicare.gov Compare Post Acute Care list provided to:: Patient Represenative (must comment) ([name]) Choice offered to / list presented to : Adult Children  Discharge Placement                         Discharge Plan and Services Additional resources added to the After Visit Summary for   In-house Referral: Clinical Social Work   Post Acute Care Choice: Skilled Nursing Facility                    HH Arranged: PT, OT [facility] Agency: [facility] Date [facility] Agency Contacted: 11/22/22 Time HH Agency Contacted: 1607 Representative spoke with at [facility] Agency: Marylene Land  Social Determinants of Health (SDOH) Interventions SDOH Screenings   Food Insecurity: No Food Insecurity (11/14/2022)  Housing: Low Risk  (11/14/2022)  Transportation Needs: No Transportation Needs (11/14/2022)  Utilities: Not At Risk (11/14/2022)  Depression (PHQ2-9): Low Risk  (08/10/2020)  Financial Resource Strain: Low Risk  (09/27/2022)   Received from [facility]   Physical Activity: Insufficiently Active (09/27/2022)   Received from [facility]  Social Connections: Socially Integrated (09/27/2022)   Received from [facility]  Stress: No Stress Concern Present (09/27/2022)   Received from [facility]  Tobacco Use: Medium Risk (11/13/2022)     Readmission Risk Interventions     No data to display

## 2022-11-23 LAB — CULTURE, BLOOD (ROUTINE X 2)
Culture: NO GROWTH
Culture: NO GROWTH
Special Requests: ADEQUATE
Special Requests: ADEQUATE

## 2022-11-28 ENCOUNTER — Telehealth: Payer: Self-pay | Admitting: Internal Medicine

## 2022-11-28 NOTE — Telephone Encounter (Signed)
Patient needs a prescription for a new CPAP Machine to be sent to [facility] in [place] or [facility].

## 2022-11-30 NOTE — Telephone Encounter (Signed)
Patient's daughter is calling. Patient needs new supplies and a new cpap machine. The one he has is no longer working.

## 2022-11-30 NOTE — Telephone Encounter (Signed)
Daughter states that patient is having a hard time sleeping at night without having a CPAP machine.

## 2022-12-01 ENCOUNTER — Encounter: Payer: Self-pay | Admitting: Internal Medicine

## 2022-12-01 DIAGNOSIS — G4733 Obstructive sleep apnea (adult) (pediatric): Secondary | ICD-10-CM

## 2022-12-02 NOTE — Telephone Encounter (Signed)
Order- [facility]- New DME( used to use [facility] in [place]) replace old and malfunctioning CPAP machine, auto 5-15, mask of choice, humidifier, supplies, AirView/  card. Has been compliant and does benefit from CPAP.

## 2022-12-02 NOTE — Telephone Encounter (Signed)
[name] [name] daughter checking on message for CPAP machine. Current CPAP machine is not working. [name] phone number is [phone].

## 2022-12-02 NOTE — Telephone Encounter (Signed)
Cpap order has been placed and pt's daughter notified via mychart message.

## 2022-12-05 ENCOUNTER — Telehealth: Payer: Self-pay | Admitting: Internal Medicine

## 2022-12-05 NOTE — Telephone Encounter (Signed)
Vinnie Langton daughter would like to use [facility] for CPAP machine. [facility] [place]. [facility] phone number is [phone]. Fax number is [phone]. [name] phone number is [phone].

## 2022-12-07 NOTE — Telephone Encounter (Signed)
Please see message below. PT is going out of town and really needs this called in. They said that [facility] has great Cust service and wants this CPAP even if they have to pay for it.

## 2022-12-07 NOTE — Telephone Encounter (Signed)
Referral,note, sleep study faxed to [facility] DME

## 2023-01-01 NOTE — Progress Notes (Signed)
HPI male former smoker followed for OSA, complicated by CAD, CHF, venous stasis dermatitis, chronic renal disease PFT: 12/16/2011-within normal limits. Small airway flows were quite normal but improved with bronchodilator. FEV1 3.03/107%, FEV1/FVC 0.82, DLCO 0.89. NPSG 12/25/91- AHI 19.3/ hr, desat to 66%, body weight 206 lbs PFT11/223- normal spirometry w/o resp to BD, Diffusion mildly reduced. ---------------------------------------------------------------------------------------------  12/30/21- 82 year old male former smoker followed for OSA, complicated by CAD/ CHF, chronic renal disease, Dementia/ Alzheimers, GERD with esophagitis, Venous stasis dermatititis, Covid infection [id number],  PAFib,  CPAP auto 5-15/[facility] ([place])Replaced 07/16/21 Download compliance- Body weight today- Covid vax-3 Moderna  Flu vax-today senior ED 12/23/21 for dyspnea. BNP 285, Glu 147. Cr 1.34 for f/u w/ cardiology, nephrology and PCP. Pending further card f/u. PFT11/223- normal spirometry w/o resp to BD, Diffusion mildly reduced. He is here today with daughter who is after him.  Unfortunately he had fallen 2 days ago hurting left hand, skin and left knee.  His hand is swollen so I suggested they take him onto urgent care after this visit in case he needs x-ray.  Download not available today but daughter confirms he is using his machine routinely.  They need a printed prescription for replacement of mask and supplies. No wheezing or cough but he describes breathlessness with prolonged standing or bending.  Cardiology follow-up is pending. CXR 12/23/21 FINDINGS: Cardiac shadow is within normal limits. Aortic calcifications are noted. The lungs are well aerated bilaterally. No focal infiltrate or sizable effusion is seen. Mild degenerative changes of the thoracic spine are noted. IMPRESSION: No acute abnormality noted.  01/03/23- 82 year old male former smoker followed for OSA, complicated by CAD/ CHF, chronic  renal disease, Dementia/ Alzheimers, GERD with esophagitis, Venous stasis dermatititis, Covid infection [id number],  PAFib,  CPAP auto 5-15/[employer] ([place])Replaced 07/16/21  Download compliance- Body weight today- Hosp Sept with cellulitis, R lower leg CPAP replacement has to wait until 1st of year- insurance. He is still using daughter's very old machine without download. Says he uses it every night and sleeps ok. Some dyspnea on exertion- recognizes mostly cardiac. Paces himself. Little wheeze or cough. CXR 11/18/22- 1V- IMPRESSION: No active disease.   ROS-see HPI  + = positive Constitutional:   No-   weight loss, night sweats, fevers, chills, fatigue, lassitude. HEENT:   No-  headaches, difficulty swallowing, tooth/dental problems, sore throat,       No-  sneezing, itching, ear ache, nasal congestion, post nasal drip,  CV:  No-   chest pain, orthopnea, PND, swelling in lower extremities, anasarca,                                           dizziness, palpitations Resp: + shortness of breath with exertion or at rest.              No-   productive cough,  No non-productive cough,  No- coughing up of blood.              No-   change in color of mucus.  No- wheezing.   Skin: No-   rash or lesions. GI:   GU:  MS:  No-   joint pain or swelling.   Neuro-     Memory decline- Alzheimers Psych:  No- change in mood or affect. No depression or anxiety.  + memory loss.  OBJ General- Alert, Oriented, Affect-appropriate, Distress- none acute. +Obese Skin-  rash-none, lesions- none, excoriation- none Lymphadenopathy- none Head- atraumatic            Eyes- Gross vision intact, PERRLA, +Strabismus, conjunctivae clear secretions            Ears- +Hearing aids, + scarring both TMs            Nose- Clear, no-Septal dev, mucus, polyps, erosion, perforation             Throat- Mallampati IV , mucosa clear , drainage- none, tonsils- atrophic Neck- flexible , trachea midline, no stridor , thyroid nl,  carotid no bruit Chest - symmetrical excursion , unlabored           Heart/CV- RRR ,  Murmur+trace S , no gallop  , no rub, nl s1 s2                           - JVD- none , edema- none, stasis changes- none, varices- none           Lung- clear to P&A, wheeze- none, cough- none , dullness-none, rub- none           Chest wall-  Abd-  Br/ Gen/ Rectal- Not done, not indicated Extrem- + elastic hose, cane,  Neuro- grossly intact to observation

## 2023-01-03 ENCOUNTER — Encounter: Payer: Self-pay | Admitting: Internal Medicine

## 2023-01-03 ENCOUNTER — Ambulatory Visit (INDEPENDENT_AMBULATORY_CARE_PROVIDER_SITE_OTHER): Payer: Medicare Other | Admitting: Internal Medicine

## 2023-01-03 VITALS — BP 131/71 | HR 83 | Ht 70.0 in | Wt 211.0 lb

## 2023-01-03 DIAGNOSIS — I480 Paroxysmal atrial fibrillation: Secondary | ICD-10-CM

## 2023-01-03 DIAGNOSIS — G4733 Obstructive sleep apnea (adult) (pediatric): Secondary | ICD-10-CM | POA: Diagnosis not present

## 2023-01-03 NOTE — Patient Instructions (Addendum)
We can continue CPAP auto 5-15. Let us know if you have problems getting this old machine replaced  by [facility] after the first of the year.  Don't forget that flu shot.

## 2023-01-21 ENCOUNTER — Other Ambulatory Visit: Payer: Self-pay

## 2023-01-21 ENCOUNTER — Emergency Department (HOSPITAL_BASED_OUTPATIENT_CLINIC_OR_DEPARTMENT_OTHER): Payer: Medicare Other | Admitting: Radiology

## 2023-01-21 ENCOUNTER — Encounter (HOSPITAL_BASED_OUTPATIENT_CLINIC_OR_DEPARTMENT_OTHER): Payer: Self-pay | Admitting: Emergency Medicine

## 2023-01-21 ENCOUNTER — Emergency Department (HOSPITAL_BASED_OUTPATIENT_CLINIC_OR_DEPARTMENT_OTHER)
Admission: EM | Admit: 2023-01-21 | Discharge: 2023-01-21 | Disposition: A | Discharge status: Home / Self Care | Payer: Medicare Other | Attending: Emergency Medicine | Admitting: Emergency Medicine

## 2023-01-21 ENCOUNTER — Emergency Department (HOSPITAL_COMMUNITY): Payer: Medicare Other

## 2023-01-21 DIAGNOSIS — Z7901 Long term (current) use of anticoagulants: Secondary | ICD-10-CM | POA: Diagnosis not present

## 2023-01-21 DIAGNOSIS — G309 Alzheimer's disease, unspecified: Secondary | ICD-10-CM | POA: Insufficient documentation

## 2023-01-21 DIAGNOSIS — I130 Hypertensive heart and chronic kidney disease with heart failure and stage 1 through stage 4 chronic kidney disease, or unspecified chronic kidney disease: Secondary | ICD-10-CM | POA: Insufficient documentation

## 2023-01-21 DIAGNOSIS — N189 Chronic kidney disease, unspecified: Secondary | ICD-10-CM | POA: Insufficient documentation

## 2023-01-21 DIAGNOSIS — R0602 Shortness of breath: Secondary | ICD-10-CM

## 2023-01-21 DIAGNOSIS — F0390 Unspecified dementia without behavioral disturbance: Secondary | ICD-10-CM | POA: Insufficient documentation

## 2023-01-21 DIAGNOSIS — I5033 Acute on chronic diastolic (congestive) heart failure: Secondary | ICD-10-CM | POA: Diagnosis not present

## 2023-01-21 LAB — BASIC METABOLIC PANEL
Anion gap: 9 (ref 5–15)
BUN: 20 mg/dL (ref 8–23)
CO2: 25 mmol/L (ref 22–32)
Calcium: 9.9 mg/dL (ref 8.9–10.3)
Chloride: 105 mmol/L (ref 98–111)
Creatinine, Ser: 1.14 mg/dL (ref 0.61–1.24)
GFR, Estimated: >60 mL/min (ref >60)
Glucose, Bld: 159 mg/dL — ABNORMAL HIGH (ref 70–99)
Potassium: 4 mmol/L (ref 3.5–5.1)
Sodium: 139 mmol/L (ref 135–145)

## 2023-01-21 LAB — CBC
HCT: 39 % (ref 39.0–52.0)
Hemoglobin: 13.3 g/dL (ref 13.0–17.0)
MCH: 31.7 pg (ref 26.0–34.0)
MCHC: 34.1 g/dL (ref 30.0–36.0)
MCV: 92.9 fL (ref 80.0–100.0)
Platelets: 220 10*3/uL (ref 150–400)
RBC: 4.2 MIL/uL — ABNORMAL LOW (ref 4.22–5.81)
RDW: 13.2 % (ref 11.5–15.5)
WBC: 6.3 10*3/uL (ref 4.0–10.5)
nRBC: 0 % (ref 0.0–0.2)

## 2023-01-21 LAB — BRAIN NATRIURETIC PEPTIDE: B Natriuretic Peptide: 208.1 pg/mL — ABNORMAL HIGH (ref 0.0–100.0)

## 2023-01-21 LAB — TROPONIN I (HIGH SENSITIVITY)
Troponin I (High Sensitivity): 11 ng/L (ref <18)
Troponin I (High Sensitivity): 11 ng/L (ref <18)

## 2023-01-21 MED ORDER — FUROSEMIDE 10 MG/ML IJ SOLN
20.0000 mg | Freq: Once | INTRAMUSCULAR | Status: AC
  Administered 2023-01-21: 20 mg via INTRAVENOUS

## 2023-01-21 MED ORDER — FUROSEMIDE 10 MG/ML IJ SOLN
40.0000 mg | Freq: Once | INTRAMUSCULAR | Status: DC
  Filled 2023-01-21: qty 4

## 2023-01-21 NOTE — Discharge Instructions (Signed)
Please follow-up with your cardiologist, your workup was reassuring today other than a mild heart failure exacerbation.  The extra dose of Lasix that we gave you in the ED should be sufficient to help with your shortness of breath.  Talk to your Cardiologist about taking an extra dose at home in the case of  further episodes of shortness of breath and see if they are okay with that plan

## 2023-01-21 NOTE — ED Triage Notes (Signed)
Pt reports sudden onset of SOB while out walking today.  +h/o Afib and has similar episodes in the past that have resolved without medical intervention per his daughter at bedside.

## 2023-01-21 NOTE — ED Notes (Signed)
Daughter Jaece Heredia can be reached at [phone].

## 2023-01-21 NOTE — ED Notes (Signed)
Discharge paperwork given and verbally understood. 

## 2023-01-21 NOTE — ED Provider Notes (Signed)
[place] EMERGENCY DEPARTMENT AT [facility] Provider Note   CSN: [id number] Arrival date & time: 01/21/23  1440     History  Chief Complaint  Patient presents with   Shortness of Breath    [name] [name] is a 82 y.o. male past medical history significant for chronic diastolic heart failure, splenic artery aneurysm, hyperlipidemia, hypertension, CKD, paroxysmal A-fib who is on Eliquis, Alzheimer's who presents with concern for onset of shortness of breath while out walking today.  Daughter reports that this is similar presentation to when [name] [name] has had A-fib in the past, [name] [name] denies any feeling of heart palpitations, [name] [name] denies any chest pain, chest tightness.  [name] [name] reports shortness of breath improved with rest, but it took several hours after walking for the shortness of breath resolved.   Shortness of Breath      Home Medications Prior to Admission medications   Medication Sig Start Date End Date Taking? Authorizing Provider  acetaminophen (TYLENOL) 325 MG tablet Take 2 tablets (650 mg total) by mouth every 6 (six) hours as needed for mild pain, fever or headache. 11/22/22   Kathlen Mody, MD  cholecalciferol (VITAMIN D3) 25 MCG (1000 UNIT) tablet Take 1,000 Units by mouth in the morning and at bedtime.    [provider]  diclofenac Sodium (VOLTAREN) 1 % GEL Apply 4 g topically 4 (four) times daily. 11/22/22   Kathlen Mody, MD  donepezil (ARICEPT) 10 MG tablet Take 10 mg by mouth at bedtime. 12/01/16   [provider]  doxycycline (VIBRAMYCIN) 100 MG capsule Take 100 mg by mouth 2 (two) times daily. 08/13/22   [provider]  ELIQUIS 5 MG TABS tablet TAKE 1 TABLET(5 MG) BY MOUTH TWICE DAILY 10/17/22   [place], Peter M, MD  fesoterodine (TOVIAZ) 8 MG TB24 tablet Take 8 mg by mouth daily. 12/13/18   [provider]  furosemide (LASIX) 20 MG tablet Take 1 tablet (20 mg total) by mouth daily. 09/07/20   Marjie Skiff E, PA-C  ketoconazole  (NIZORAL) 2 % cream Apply 1 application topically daily. 02/27/21   [provider]  metroNIDAZOLE (METROGEL) 1 % gel Apply 1 Application topically daily.    [provider]  mirabegron ER (MYRBETRIQ) 50 MG TB24 tablet Take 50 mg by mouth every morning.    [provider]  Misc Natural Products (OSTEO BI-FLEX/5-LOXIN ADVANCED) TABS Take 1 tablet by mouth 2 (two) times daily.    [provider]  Multiple Vitamin (MULTIVITAMIN WITH MINERALS) TABS tablet Take 1 tablet by mouth every morning.    [provider]  nitrofurantoin, macrocrystal-monohydrate, (MACROBID) 100 MG capsule Take 1 capsule (100 mg total) by mouth in the morning. Patient taking differently: Take 100 mg by mouth every evening. 01/23/21   Almon Hercules, MD  Omega-3 Fatty Acids (FISH OIL) 1200 MG CAPS Take 1,200 mg by mouth 2 (two) times daily.    [provider]  PRESCRIPTION MEDICATION Inhale into the lungs at bedtime. CPAP    [provider]  rosuvastatin (CRESTOR) 10 MG tablet Take 0.5 tablets (5 mg total) by mouth every other day. 06/28/21   [place], Peter M, MD  terbinafine (LAMISIL) 1 % cream Apply 1 Application topically daily.    [provider]  triamcinolone cream (KENALOG) 0.1 % Apply 1 Application topically 2 (two) times daily. 10/13/22   [provider]      Allergies    Enablex [darifenacin hydrobromide er], Atorvastatin, Ceftriaxone, Ezetimibe, Levaquin [levofloxacin], Metoprolol,  Oxybutynin, Sulfamethoxazole, Sulfasalazine, and Sulfonamide derivatives    Review of Systems   Review of Systems  Respiratory:  Positive for shortness of breath.   All other systems reviewed and are negative.   Physical Exam Updated Vital Signs BP 123/70   Pulse (!) 53   Temp 98 F (36.7 C)   Resp 16   SpO2 96%  Physical Exam Vitals and nursing note reviewed.  Constitutional:      General: [name] [name] is not in acute distress.    Appearance: Normal  appearance.  HENT:     Head: Normocephalic and atraumatic.  Eyes:     General:        Right eye: No discharge.        Left eye: No discharge.  Cardiovascular:     Rate and Rhythm: Normal rate and regular rhythm.     Heart sounds: No murmur heard.    No friction rub. No gallop.  Pulmonary:     Effort: Pulmonary effort is normal.     Breath sounds: Normal breath sounds.     Comments: No wheezing, rhonchi, stridor, rales, no crackles or focal consolidation noted Abdominal:     General: Bowel sounds are normal.     Palpations: Abdomen is soft.  Skin:    General: Skin is warm and dry.     Capillary Refill: Capillary refill takes less than 2 seconds.  Neurological:     Mental Status: [name] [name] is alert and oriented to person, place, and time.  Psychiatric:        Mood and Affect: Mood normal.        Behavior: Behavior normal.     ED Results / Procedures / Treatments   Labs (all labs ordered are listed, but only abnormal results are displayed) Labs Reviewed  CBC - Abnormal; Notable for the following components:      Result Value   RBC 4.20 (*)    All other components within normal limits  BASIC METABOLIC PANEL - Abnormal; Notable for the following components:   Glucose, Bld 159 (*)    All other components within normal limits  BRAIN NATRIURETIC PEPTIDE - Abnormal; Notable for the following components:   B Natriuretic Peptide 208.1 (*)    All other components within normal limits  TROPONIN I (HIGH SENSITIVITY)  TROPONIN I (HIGH SENSITIVITY)    EKG EKG Interpretation Date/Time:  Saturday January 21 2023 14:52:03 EST Ventricular Rate:  67 PR Interval:    QRS Duration:  144 QT Interval:  416 QTC Calculation: 440 R Axis:   11  Text Interpretation: old LBBB suspect junctional vs 1st AVB. similar to oldertracing Confirmed by Arby Barrette [phone]) on 01/21/2023 3:00:58 PM  Radiology DG Chest 2 View  Result Date: 01/21/2023 CLINICAL DATA:  Shortness of breath. EXAM: CHEST -  2 VIEW COMPARISON:  11/18/2022 FINDINGS: The lungs are clear without focal pneumonia, edema, pneumothorax or pleural effusion. Interstitial markings are diffusely coarsened with chronic features. Streaky opacity at the left base is similar to prior compatible with chronic atelectasis or scarring. Cardiopericardial silhouette is at upper limits of normal for size. No acute bony abnormality. Telemetry leads overlie the chest. IMPRESSION: Chronic interstitial coarsening without acute cardiopulmonary findings. Electronically Signed   By: Kennith Center M.D.   On: 01/21/2023 16:21    Procedures Procedures    Medications Ordered in ED Medications  furosemide (LASIX) injection 20 mg (20 mg Intravenous Given 01/21/23 1719)    ED Course/ Medical Decision Making/ A&P  Medical Decision Making Amount and/or Complexity of Data Reviewed Labs: ordered. Radiology: ordered.  Risk Prescription drug management.   This patient is a 82 y.o. male  who presents to the ED for concern of shortness of breath with exertion.   Differential diagnoses prior to evaluation: The emergent differential diagnosis includes, but is not limited to,  asthma exacerbation, COPD exacerbation, acute upper respiratory infection, acute bronchitis, chronic bronchitis, interstitial lung disease, ARDS, PE, pneumonia, atypical ACS, carbon monoxide poisoning, spontaneous pneumothorax, new CHF vs CHF exacerbation, versus other . This is not an exhaustive differential.   Past Medical History / Co-morbidities / Social History: Diastolic CHF, splenic artery aneurysm, CKD, paroxysmal A-fib on anticoagulation, dementia, hyperlipidemia, hypertension  Additional history: Chart reviewed. Pertinent results include: Reviewed cardiac procedures including previous echoes, they report that [name] [name] has had previous cath but I cannot find the information in his chart, [name] [name] did not have any previous stents placed  reportedly  Physical Exam: Physical exam performed. The pertinent findings include: No wheezing, rhonchi, stridor, rales, no crackles or focal consolidation noted no significant peripheral extremity edema.  Lab Tests/Imaging studies: I personally interpreted labs/imaging and the pertinent results include: BMP with mildly elevated glucose at 159, CBC unremarkable, his BNP is mildly elevated 208, heart failure exacerbation may be partially contributing to his shortness of breath, initial troponin 11 with delta at 11, I independently interpreted plain film chest x-ray which shows chronic interstitial coarsening without any acute intrathoracic abnormality.  I agree with the radiologist interpretation.  Cardiac monitoring: EKG obtained and interpreted by myself and attending physician which shows: Junctional rhythm   Medications: I ordered medication including 20 mg Lasix for heart failure exacerbation.  I have reviewed the patients home medicines and have made adjustments as needed.   Disposition: After consideration of the diagnostic results and the patients response to treatment, I feel that no other acute cause of shortness of breath noted in the ED, no evidence of ACS, Lasix administered for mild heart failure exacerbation, I think patient is stable for discharge and close cardiac follow-up at this time.   emergency department workup does not suggest an emergent condition requiring admission or immediate intervention beyond what has been performed at this time. The plan is: as above. The patient is safe for discharge and has been instructed to return immediately for worsening symptoms, change in symptoms or any other concerns.  Final Clinical Impression(s) / ED Diagnoses Final diagnoses:  Shortness of breath  Acute on chronic diastolic congestive heart failure [facility])    Rx / DC Orders ED Discharge Orders     None         Olene Floss, PA-C 01/21/23 1802    Lonell Grandchild, MD 01/22/23 1507

## 2023-02-03 ENCOUNTER — Ambulatory Visit (INDEPENDENT_AMBULATORY_CARE_PROVIDER_SITE_OTHER): Payer: Medicare Other | Admitting: Podiatry

## 2023-02-03 ENCOUNTER — Encounter: Payer: Self-pay | Admitting: Podiatry

## 2023-02-03 DIAGNOSIS — Q828 Other specified congenital malformations of skin: Secondary | ICD-10-CM

## 2023-02-03 DIAGNOSIS — Z7901 Long term (current) use of anticoagulants: Secondary | ICD-10-CM

## 2023-02-03 DIAGNOSIS — M79675 Pain in left toe(s): Secondary | ICD-10-CM

## 2023-02-03 DIAGNOSIS — M79674 Pain in right toe(s): Secondary | ICD-10-CM

## 2023-02-03 DIAGNOSIS — B351 Tinea unguium: Secondary | ICD-10-CM | POA: Diagnosis not present

## 2023-02-03 NOTE — Progress Notes (Signed)
  Subjective:  Patient ID: [name] [name], male    DOB: [birth year],   MRN: [id number]  Chief Complaint  Patient presents with   Nail Problem    rfc    82 y.o. male presents for concern of thickened elongated and painful nails that are difficult to trim. Requesting to have them trimmed today. Cocnerned about left great toe crack as well as right pinky toenail.   Does relates some burning sensation in feet that come and go.  He is not diabetic but is currently on Eliquis.   PCP:  [name], NP    . Denies any other pedal complaints. Denies n/v/f/c.   Past Medical History:  Diagnosis Date   Alzheimer disease (HCC)    Chronic kidney disease    mild insuffiency   Coronary artery disease    LHC 4/10: Mid LAD 40-50%, then 70%, OM1 20-30%, EF 65%. Mid LAD FFR 0.89 (not hemodynamically significant). Medical therapy was continued.   Dysuria 08/10/2020   Hyperlipidemia    LBBB (left bundle branch block) 06/2018   Obstructive sleep apnea     Objective:  Physical Exam: Vascular: DP/PT pulses 2/4 bilateral. CFT <3 seconds. Absent hair growth on digits. Edema noted to bilateral lower extremities. Xerosis noted bilaterally.  Skin. No lacerations or abrasions bilateral feet. Nails 1-5 bilateral  are thickened discolored and elongated with subungual debris.  Mild erythema noted over dorsum of fifth digit appear to be rubbing from shoes. Hyperkeratotic tissue noted bilateral plantar fifth metatarsals with cored areas noted.  Musculoskeletal: MMT 5/5 bilateral lower extremities in DF, PF, Inversion and Eversion. Deceased ROM in DF of ankle joint. Hammered digits 2-5 bilateral.  Neurological: Sensation intact to light touch. Protective sensation intact bilateral.    Assessment:   1. Pain due to onychomycosis of toenails of both feet   2. Chronic anticoagulation   3. Porokeratosis         Plan:  Patient was evaluated and treated and all questions answered. -Discussed and educated  patient on foot care, especially with  regards to the vascular, neurological and musculoskeletal systems.  -Discussed supportive shoes at all times and checking feet regularly.  -Mechanically debrided all nails 1-5 bilateral using sterile nail nipper and filed with dremel without incident  -Hyperkeratotic tissue x2 debrided bilateral fifth metatarsal heads without incident with chisel.  -Answered all patient questions -Patient to return  in 4 months for at risk foot care -Patient advised to call the office if any problems or questions arise in the meantime.   Louann Sjogren, DPM

## 2023-02-04 ENCOUNTER — Encounter: Payer: Self-pay | Admitting: Internal Medicine

## 2023-02-04 NOTE — Assessment & Plan Note (Signed)
Describes good compliance and sleep using very old machine. Expects insurance to cover replacement next year.

## 2023-02-04 NOTE — Assessment & Plan Note (Signed)
Defer to cardiology

## 2023-02-21 ENCOUNTER — Other Ambulatory Visit: Payer: Self-pay

## 2023-02-21 MED ORDER — ROSUVASTATIN CALCIUM 10 MG PO TABS: 5.0000 mg | ORAL_TABLET | ORAL | 1 refills | Status: DC

## 2023-03-17 ENCOUNTER — Emergency Department (HOSPITAL_COMMUNITY)
Admission: EM | Admit: 2023-03-17 | Discharge: 2023-03-17 | Discharge status: Against Medical Advice | Payer: Medicare Other | Attending: Emergency Medicine | Admitting: Emergency Medicine

## 2023-03-17 ENCOUNTER — Other Ambulatory Visit: Payer: Self-pay

## 2023-03-17 ENCOUNTER — Telehealth: Payer: Self-pay | Admitting: Cardiology

## 2023-03-17 ENCOUNTER — Encounter (HOSPITAL_COMMUNITY): Payer: Self-pay | Admitting: Emergency Medicine

## 2023-03-17 ENCOUNTER — Emergency Department (HOSPITAL_COMMUNITY): Payer: Medicare Other

## 2023-03-17 DIAGNOSIS — Z5321 Procedure and treatment not carried out due to patient leaving prior to being seen by health care provider: Secondary | ICD-10-CM | POA: Diagnosis not present

## 2023-03-17 DIAGNOSIS — R0602 Shortness of breath: Secondary | ICD-10-CM | POA: Diagnosis present

## 2023-03-17 LAB — BASIC METABOLIC PANEL
Anion gap: 8 (ref 5–15)
BUN: 19 mg/dL (ref 8–23)
CO2: 24 mmol/L (ref 22–32)
Calcium: 10.1 mg/dL (ref 8.9–10.3)
Chloride: 105 mmol/L (ref 98–111)
Creatinine, Ser: 1.31 mg/dL — ABNORMAL HIGH (ref 0.61–1.24)
GFR, Estimated: 54 mL/min — ABNORMAL LOW (ref >60)
Glucose, Bld: 158 mg/dL — ABNORMAL HIGH (ref 70–99)
Potassium: 3.9 mmol/L (ref 3.5–5.1)
Sodium: 137 mmol/L (ref 135–145)

## 2023-03-17 LAB — CBC
HCT: 40.5 % (ref 39.0–52.0)
Hemoglobin: 13.4 g/dL (ref 13.0–17.0)
MCH: 31.2 pg (ref 26.0–34.0)
MCHC: 33.1 g/dL (ref 30.0–36.0)
MCV: 94.2 fL (ref 80.0–100.0)
Platelets: 221 10*3/uL (ref 150–400)
RBC: 4.3 MIL/uL (ref 4.22–5.81)
RDW: 12.6 % (ref 11.5–15.5)
WBC: 5.9 10*3/uL (ref 4.0–10.5)
nRBC: 0 % (ref 0.0–0.2)

## 2023-03-17 LAB — TROPONIN I (HIGH SENSITIVITY): Troponin I (High Sensitivity): 8 ng/L (ref <18)

## 2023-03-17 LAB — BRAIN NATRIURETIC PEPTIDE: B Natriuretic Peptide: 86.4 pg/mL (ref 0.0–100.0)

## 2023-03-17 LAB — PROTIME-INR
INR: 1.4 — ABNORMAL HIGH (ref 0.8–1.2)
Prothrombin Time: 17 s — ABNORMAL HIGH (ref 11.4–15.2)

## 2023-03-17 MED ORDER — ALBUTEROL SULFATE HFA 108 (90 BASE) MCG/ACT IN AERS: 2.0000 | INHALATION_SPRAY | RESPIRATORY_TRACT | Status: DC | PRN

## 2023-03-17 NOTE — Telephone Encounter (Signed)
Spoke to patient's daughter she stated father has been sob.Weight stable.No swelling.He went to ED this morning but left without seeing a Dr.He had lab done and she would like Dr.Jordan to review.Appointment scheduled with Dr.Jordan 1/21 at 2:20 pm.I will make Dr.Jordan aware.

## 2023-03-17 NOTE — ED Triage Notes (Signed)
Pt via POV w family member reporting that he has been very SOB and feels like his a fib is "acting up", first noticed around 0830 this morning. Pulse at home was measured 88-92bpm at home. Pt states he still feels SOB at time of triage; NAD noted. Vitals unremarkable. Family member notes that pt has been using a new cpap machine for about 6 months.

## 2023-03-17 NOTE — Progress Notes (Unsigned)
Office Visit    Patient Name: [name] [name] Date of Encounter: 03/21/2023  PCP:  [name], NP   [facility]  Cardiologist:  Kathia Covington [place], MD  Advanced Practice Provider:  Marcelino Duster, PA Electrophysiologist:  None      Chief Complaint    [name] [name] is a 83 y.o. male presents today for evaluation of SOB  Past Medical History    Past Medical History:  Diagnosis Date   Alzheimer disease (HCC)    Chronic kidney disease    mild insuffiency   Coronary artery disease    LHC 4/10: Mid LAD 40-50%, then 70%, OM1 20-30%, EF 65%. Mid LAD FFR 0.89 (not hemodynamically significant). Medical therapy was continued.   Dysuria 08/10/2020   Hyperlipidemia    LBBB (left bundle branch block) 06/2018   Obstructive sleep apnea    Past Surgical History:  Procedure Laterality Date   CARDIAC CATHETERIZATION  05/02/2003   single vessel,moderate stenosis mid LAD   CARDIAC CATHETERIZATION  06/12/2008   continue med. therapy   right shoulder surgery     TOTAL HIP ARTHROPLASTY Left 09/14/2018   Procedure: LEFT TOTAL HIP ARTHROPLASTY ANTERIOR APPROACH;  Surgeon: Kathryne Hitch, MD;  Location: [facility];  Service: Orthopedics;  Laterality: Left;    Allergies  Allergies  Allergen Reactions   Enablex [Darifenacin Hydrobromide Er]     PT STATES IT MAKES HIM "BONKERS" AND VERY TIRED   Atorvastatin Other (See Comments)    Muscle aches/weakness  Other reaction(s): Other (See Comments)  Muscle aches/weakness  Muscle aches/weakness  Muscle aches/weakness  Muscle aches/weakness  Muscle aches/weakness  Muscle aches/weakness   Ceftriaxone Other (See Comments)    Unknown reaction  Other reaction(s): Other (See Comments)  Unknown reaction  Unknown reaction    Unknown reaction  Unknown reaction  Unknown reaction   Ezetimibe Other (See Comments)    Muscle aches/weakness  Other reaction(s): Other (See Comments)  Muscle aches/weakness  Muscle  aches/weakness  Muscle aches/weakness   Levaquin [Levofloxacin] Diarrhea   Metoprolol Rash   Oxybutynin Other (See Comments)    Memory impairment, temperament changes   Sulfamethoxazole Other (See Comments)    Childhood allergy   Sulfasalazine Other (See Comments)    Childhood allergy   Sulfonamide Derivatives Other (See Comments)    Childhood allergy    History of Present Illness    [name] [name] is a 83 y.o. male with a hx of Aslzheimer's disease, diastolic heart failure, OSA on CPAP, HTN, HLD, moderate nonobstructive CAD on cardiac cath 2010 (FFR on LAD 2010 negative, Myoview 06/2018 low risk) , chronic shortness of breath, LBBB, KCD III, atrial fibrillation   Admitted 12/2020 with sepsis due to deep neck infection. Agitation and infection resolved with Decadron and abx. EKG with atrial fibrillation. Rate controlled at rest but 140s with activity. Low dose Metoprolol and OAC were discussed but not initiated. Eliquis subsequently started in outpatient setting.   Admitted 03/2021 with AMS and SIRS due to COVID. 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 At follow up 04/20/21 still short of breath with bending or turning which was chronic. No atrial fibrillation during this hospitalization.   Daughter contacted the office 09/15/21 noting increased episodes of atrial fib with fatigue, shortness of breath. Was seen by [name] Shields NP on 7/26 and was in NSR. Labs were done and were OK. A 2 week  event monitor was placed. This showed no Afib. Some nonsustained runs of SVT. Junctional rhythm with rate 55-60 seemed to correlate with symptoms.   He presented to the ED on 12/23/2021 with shortness of breath and fatigue.  Basic metabolic panel showed creatinine of 1.34.  BNP 285 which is lower than a month ago.  Troponin negative x2 chest x-ray showed no acute issue.  COVID test negative. Was seen in follow up with [name] Course  PA-C- his lasix was increased for a couple of days. His Coreg was discontinued due to findings of bradycardia intermittently. Echo was done and showed normal LV EF with grade 2 diastolic dysfunction.  PFTS were also arranged. This looked OK with mildly reduced DLCO.    He was seen in ED in Dec 2023 with LLE cellulitis. Was placed on doxycycline. Also had recurrent parotiditis. States symptoms persisted and went back to ED 03/20/22. Switched doxycycline to clindamycin. Was noted in ED to have marked bradycardia with HR down to high 30s with sinus brady seen on ECG with rate 41. Since then BP and HR ok at home. Cellulitis improved.   He was admitted in September 2024 with LE cellulitis. He had mild acute on chronic diastolic heart failure.Elevated BNP on admission and with bilateral lower extremity edema. Echocardiogram showed preserved left ventricular ejection fraction with no regional wall motion abnormalities, has grade 1 diastolic dysfunction.  Received IV Lasix with good diuresis but  he became hypotensive with therapy and this was discontinued.    He was in the ED on Jan 17 with complaints of increased SOB difficulty breathing for 24 hours and family concerned he was in Afib. Ecg showed NSR with LBBB. Vitals were OK. Creatinine 1.31. otherwise CBC BMET, BNP and troponins negative. CXR with mild atelectasis.   EKGs/Labs/Other Studies Reviewed:   The following studies were reviewed today:  Echo 04/2020 1. Left ventricular ejection fraction, by estimation, is 60 to 65%. The  left ventricle has normal function. The left ventricle has no regional  wall motion abnormalities. There is mild left ventricular hypertrophy.  Left ventricular diastolic parameters  are consistent with Grade I diastolic dysfunction (impaired relaxation).   2. Right ventricular systolic function is normal. The right ventricular  size is normal.   3. The mitral valve is normal in structure. Mild mitral valve  regurgitation.  No evidence of mitral stenosis.   4. The aortic valve is tricuspid. Aortic valve regurgitation is trivial.  Mild aortic valve sclerosis is present, with no evidence of aortic valve  stenosis.   5. The inferior vena cava is normal in size with greater than 50%  respiratory variability, suggesting right atrial pressure of 3 mmHg.   Event monitor 10/14/21: Study Highlights      Normal sinus rhythm   3 runs of SVT longest lasting 31 seconds.   Junctional rhythm with rate 55-60 bpm. Patient triggered events correlate with this   No Atrial fibrillation     Patch Wear Time:  8 days and 1 hours (2023-07-26T08:56:18-0400 to 2023-08-03T10:09:56-0400)   Patient had a min HR of 29 bpm, max HR of 102 bpm, and avg HR of 63 bpm. Predominant underlying rhythm was Sinus Rhythm. First Degree AV Block was present. Bundle Branch Block/IVCD was present. QRS morphology changes were present throughout recording. 3  Supraventricular Tachycardia runs occurred, the run with the fastest interval lasting 10 beats with a max rate of 102 bpm, the longest lasting 31.4 secs with an avg rate of 96 bpm.  Junctional Rhythm was present. Junctional Rhythm was detected within +/-  45 seconds of symptomatic patient event(s). Isolated SVEs were rare (<1.0%), SVE Couplets were rare (<1.0%), and SVE Triplets were rare (<1.0%). Isolated VEs were rare (<1.0%), and no VE Couplets or VE Triplets were present.  Echo 01/14/22: IMPRESSIONS     1. Left ventricular ejection fraction, by estimation, is 65 to 70%. The  left ventricle has normal function. The left ventricle has no regional  wall motion abnormalities. There is mild left ventricular hypertrophy.  Left ventricular diastolic parameters  are consistent with Grade II diastolic dysfunction (pseudonormalization).  Elevated left atrial pressure.   2. Right ventricular systolic function is normal. The right ventricular  size is normal. Tricuspid regurgitation signal is inadequate  for assessing  PA pressure.   3. The mitral valve is normal in structure. No evidence of mitral valve  regurgitation. No evidence of mitral stenosis.   4. The aortic valve was not well visualized. Aortic valve regurgitation  is not visualized. No aortic stenosis is present.    EKG:  done 03/16/22: NSR with LBBB. No acute change. I have personally reviewed and interpreted this study.    Recent Labs: 11/13/2022: ALT 26 11/14/2022: Magnesium 2.2 03/17/2023: B Natriuretic Peptide 86.4; BUN 19; Creatinine, Ser 1.31; Hemoglobin 13.4; Platelets 221; Potassium 3.9; Sodium 137  Recent Lipid Panel No results found for: "CHOL", "TRIG", "HDL", "CHOLHDL", "VLDL", "LDLCALC", "LDLDIRECT" Dated 12/07/22: normal CBC and CMET  Risk Assessment/Calculations:   CHA2DS2-VASc Score =     This indicates a  % annual risk of stroke. The patient's score is based upon:       Home Medications   Current Meds  Medication Sig   acetaminophen (TYLENOL) 325 MG tablet Take 2 tablets (650 mg total) by mouth every 6 (six) hours as needed for mild pain, fever or headache.   cholecalciferol (VITAMIN D3) 25 MCG (1000 UNIT) tablet Take 1,000 Units by mouth in the morning and at bedtime.   diclofenac Sodium (VOLTAREN) 1 % GEL Apply 4 g topically 4 (four) times daily.   donepezil (ARICEPT) 10 MG tablet Take 10 mg by mouth at bedtime.   doxycycline (VIBRAMYCIN) 100 MG capsule Take 100 mg by mouth 2 (two) times daily.   ELIQUIS 5 MG TABS tablet TAKE 1 TABLET(5 MG) BY MOUTH TWICE DAILY   fesoterodine (TOVIAZ) 8 MG TB24 tablet Take 8 mg by mouth daily.   furosemide (LASIX) 20 MG tablet Take 1 tablet (20 mg total) by mouth daily.   ketoconazole (NIZORAL) 2 % cream Apply 1 application topically daily.   metroNIDAZOLE (METROGEL) 1 % gel Apply 1 Application topically daily.   mirabegron ER (MYRBETRIQ) 50 MG TB24 tablet Take 50 mg by mouth every morning.   Misc Natural Products (OSTEO BI-FLEX/5-LOXIN ADVANCED) TABS Take 1 tablet  by mouth 2 (two) times daily.   Multiple Vitamin (MULTIVITAMIN WITH MINERALS) TABS tablet Take 1 tablet by mouth every morning.   nitrofurantoin, macrocrystal-monohydrate, (MACROBID) 100 MG capsule Take 1 capsule (100 mg total) by mouth in the morning. (Patient taking differently: Take 100 mg by mouth every evening.)   Omega-3 Fatty Acids (FISH OIL) 1200 MG CAPS Take 1,200 mg by mouth 2 (two) times daily.   PRESCRIPTION MEDICATION Inhale into the lungs at bedtime. CPAP   rosuvastatin (CRESTOR) 10 MG tablet Take 0.5 tablets (5 mg total) by mouth every other day.   terbinafine (LAMISIL) 1 % cream Apply 1 Application topically daily.   triamcinolone cream (KENALOG)  0.1 % Apply 1 Application topically 2 (two) times daily.     Review of Systems      All other systems reviewed and are otherwise negative except as noted above.  Physical Exam    VS:  BP 132/70   Pulse 62   Ht 5\' 9"  (1.753 m)   Wt 268 lb (121.6 kg)   SpO2 96%   BMI 39.58 kg/m  , BMI Body mass index is 39.58 kg/m.  Wt Readings from Last 3 Encounters:  03/21/23 268 lb (121.6 kg)  03/17/23 196 lb (88.9 kg)  01/03/23 211 lb (95.7 kg)    GEN: Well nourished, overweight, well developed, in no acute distress. HEENT: normal. Neck: Supple, no JVD, carotid bruits, or masses. Cardiac: RRR, no murmurs, rubs, or gallops. No clubbing, cyanosis, edema.  Radials/PT 2+ and equal bilaterally.  Respiratory:  Respirations regular and unlabored, clear to auscultation bilaterally. GI: Soft, nontender, nondistended. MS: No deformity or atrophy. Skin: Warm and dry, no rash. Neuro:  Strength and sensation are intact. Psych: Normal affect.  Assessment & Plan    Atrial fibrillation / Hypercoagulable state  Had Afib during hospitalization in November 2022 when septic. No documented recurrence since then. In fact event monitor in August 2023 showed no afib. NSR on recent ED visit Remains on Eliquis. Beta blocker discontinued due bradycardia.    Chronic diastolic heart failure - Weight is stable compared to last clinic visit. No edema, orthopnea, PND. Continue Lasix 20mg  QD.  Low sodium diet, fluid restriction <2L, and daily weights encouraged.   Chronic SOB -  multifactorial. Does not appear to be significantly volume overloaded. Symptoms did not change with diuresis. Recent CXR, BNP normal.   HTN - BP well controlled.    5.  Sinus brady. No clear symptoms. Monitor this past summer without sustained brady. On no rate slowing meds.   Disposition: Follow up  6 months   Signed, Tawan Corkern [place], MD 03/21/2023, 2:30 PM [facility]

## 2023-03-17 NOTE — Telephone Encounter (Signed)
Pt daughter called in stating pt was having some SOB and PCP advised them to go to the ED. They drew labs that they would like Dr. [place] to look at and advise on treatment.   She  states they left because there were a lot of people there and they were afraid of getting the pt sicker. Pt is not currently having any symptoms. Please advise.

## 2023-03-17 NOTE — ED Notes (Signed)
Patient left AMA.

## 2023-03-21 ENCOUNTER — Encounter: Payer: Self-pay | Admitting: Cardiology

## 2023-03-21 ENCOUNTER — Ambulatory Visit: Payer: Medicare Other | Attending: Cardiology | Admitting: Cardiology

## 2023-03-21 VITALS — BP 132/70 | HR 62 | Ht 69.0 in | Wt 268.0 lb

## 2023-03-21 DIAGNOSIS — R001 Bradycardia, unspecified: Secondary | ICD-10-CM | POA: Diagnosis present

## 2023-03-21 DIAGNOSIS — I5032 Chronic diastolic (congestive) heart failure: Secondary | ICD-10-CM | POA: Insufficient documentation

## 2023-03-21 DIAGNOSIS — I480 Paroxysmal atrial fibrillation: Secondary | ICD-10-CM | POA: Diagnosis present

## 2023-03-21 DIAGNOSIS — Z7901 Long term (current) use of anticoagulants: Secondary | ICD-10-CM | POA: Diagnosis present

## 2023-03-21 DIAGNOSIS — I447 Left bundle-branch block, unspecified: Secondary | ICD-10-CM | POA: Diagnosis present

## 2023-03-21 NOTE — Patient Instructions (Signed)
Medication Instructions:  Continue same medications *If you need a refill on your cardiac medications before your next appointment, please call your pharmacy*   Lab Work: None ordered   Testing/Procedures: None ordered   Follow-Up: At [facility], you and your health needs are our priority.  As part of our continuing mission to provide you with exceptional heart care, we have created designated Provider Care Teams.  These Care Teams include your primary Cardiologist (physician) and Advanced Practice Providers (APPs -  Physician Assistants and Nurse Practitioners) who all work together to provide you with the care you need, when you need it.  We recommend signing up for the patient portal called "MyChart".  Sign up information is provided on this After Visit Summary.  MyChart is used to connect with patients for Virtual Visits (Telemedicine).  Patients are able to view lab/test results, encounter notes, upcoming appointments, etc.  Non-urgent messages can be sent to your provider as well.   To learn more about what you can do with MyChart, go to [email or web address].    Your next appointment:  6 months   Call in April to schedule July appointment    ( New Office )    Provider:  Dr.Jordan

## 2023-05-02 ENCOUNTER — Other Ambulatory Visit (HOSPITAL_COMMUNITY)

## 2023-05-04 ENCOUNTER — Ambulatory Visit: Payer: Medicare Other | Admitting: Podiatry

## 2023-05-04 ENCOUNTER — Other Ambulatory Visit (HOSPITAL_COMMUNITY)
Admission: RE | Admit: 2023-05-04 | Discharge: 2023-05-04 | Disposition: A | Discharge status: Home / Self Care | Payer: Self-pay | Source: Ambulatory Visit | Attending: Medical Genetics | Admitting: Medical Genetics

## 2023-05-04 ENCOUNTER — Encounter: Payer: Self-pay | Admitting: Podiatry

## 2023-05-04 DIAGNOSIS — Q828 Other specified congenital malformations of skin: Secondary | ICD-10-CM | POA: Diagnosis not present

## 2023-05-04 DIAGNOSIS — B351 Tinea unguium: Secondary | ICD-10-CM | POA: Diagnosis not present

## 2023-05-04 DIAGNOSIS — Z7901 Long term (current) use of anticoagulants: Secondary | ICD-10-CM | POA: Diagnosis not present

## 2023-05-04 DIAGNOSIS — M79674 Pain in right toe(s): Secondary | ICD-10-CM

## 2023-05-04 DIAGNOSIS — Z006 Encounter for examination for normal comparison and control in clinical research program: Secondary | ICD-10-CM | POA: Insufficient documentation

## 2023-05-04 DIAGNOSIS — M79675 Pain in left toe(s): Secondary | ICD-10-CM

## 2023-05-04 NOTE — Progress Notes (Signed)
  Subjective:  Patient ID: [name] [name], male    DOB: Jan 07, 1941,   MRN: [id number]  No chief complaint on file.   83 y.o. male presents for concern of thickened elongated and painful nails that are difficult to trim. Requesting to have them trimmed today. Cocnerned about left great toe crack as well as right pinky toenail.   Does relates some burning sensation in feet that come and go.  He is not diabetic but is currently on Eliquis.   PCP:  [name], NP    . Denies any other pedal complaints. Denies n/v/f/c.   Past Medical History:  Diagnosis Date   Alzheimer disease (HCC)    Chronic kidney disease    mild insuffiency   Coronary artery disease    LHC 4/10: Mid LAD 40-50%, then 70%, OM1 20-30%, EF 65%. Mid LAD FFR 0.89 (not hemodynamically significant). Medical therapy was continued.   Dysuria 08/10/2020   Hyperlipidemia    LBBB (left bundle branch block) 06/2018   Obstructive sleep apnea     Objective:  Physical Exam: Vascular: DP/PT pulses 2/4 bilateral. CFT <3 seconds. Absent hair growth on digits. Edema noted to bilateral lower extremities. Xerosis noted bilaterally.  Skin. No lacerations or abrasions bilateral feet. Nails 1-5 bilateral  are thickened discolored and elongated with subungual debris.  Mild erythema noted over dorsum of fifth digit appear to be rubbing from shoes. Hyperkeratotic tissue noted bilateral plantar fifth metatarsals with cored areas noted.  Musculoskeletal: MMT 5/5 bilateral lower extremities in DF, PF, Inversion and Eversion. Deceased ROM in DF of ankle joint. Hammered digits 2-5 bilateral.  Neurological: Sensation intact to light touch. Protective sensation intact bilateral.    Assessment:   1. Pain due to onychomycosis of toenails of both feet   2. Chronic anticoagulation         Plan:  Patient was evaluated and treated and all questions answered. -Discussed and educated patient on foot care, especially with  regards to the  vascular, neurological and musculoskeletal systems.  -Discussed supportive shoes at all times and checking feet regularly.  -Mechanically debrided all nails 1-5 bilateral using sterile nail nipper and filed with dremel without incident  -Hyperkeratotic tissue x2 debrided bilateral fifth metatarsal heads without incident with chisel.  -Answered all patient questions -Patient to return  in 4 months for at risk foot care -Patient advised to call the office if any problems or questions arise in the meantime.   Louann Sjogren, DPM

## 2023-05-17 LAB — GENECONNECT MOLECULAR SCREEN: Genetic Analysis Overall Interpretation: NEGATIVE

## 2023-06-09 ENCOUNTER — Other Ambulatory Visit: Payer: Self-pay | Admitting: Cardiology

## 2023-06-09 ENCOUNTER — Ambulatory Visit: Admitting: Emergency Medicine

## 2023-06-09 DIAGNOSIS — I480 Paroxysmal atrial fibrillation: Secondary | ICD-10-CM

## 2023-06-19 ENCOUNTER — Emergency Department (HOSPITAL_COMMUNITY)

## 2023-06-19 ENCOUNTER — Ambulatory Visit: Admitting: General Practice

## 2023-06-19 ENCOUNTER — Emergency Department (HOSPITAL_COMMUNITY)
Admission: EM | Admit: 2023-06-19 | Discharge: 2023-06-19 | Disposition: A | Discharge status: Home / Self Care | Attending: Emergency Medicine | Admitting: Emergency Medicine

## 2023-06-19 ENCOUNTER — Encounter (HOSPITAL_COMMUNITY): Payer: Self-pay | Admitting: Emergency Medicine

## 2023-06-19 ENCOUNTER — Telehealth: Payer: Self-pay | Admitting: Cardiology

## 2023-06-19 ENCOUNTER — Other Ambulatory Visit: Payer: Self-pay

## 2023-06-19 DIAGNOSIS — N1831 Chronic kidney disease, stage 3a: Secondary | ICD-10-CM | POA: Diagnosis not present

## 2023-06-19 DIAGNOSIS — I5031 Acute diastolic (congestive) heart failure: Secondary | ICD-10-CM | POA: Diagnosis not present

## 2023-06-19 DIAGNOSIS — R7989 Other specified abnormal findings of blood chemistry: Secondary | ICD-10-CM | POA: Insufficient documentation

## 2023-06-19 DIAGNOSIS — R0602 Shortness of breath: Secondary | ICD-10-CM | POA: Diagnosis present

## 2023-06-19 DIAGNOSIS — Z7901 Long term (current) use of anticoagulants: Secondary | ICD-10-CM | POA: Diagnosis not present

## 2023-06-19 DIAGNOSIS — G309 Alzheimer's disease, unspecified: Secondary | ICD-10-CM | POA: Diagnosis not present

## 2023-06-19 DIAGNOSIS — Z87891 Personal history of nicotine dependence: Secondary | ICD-10-CM | POA: Insufficient documentation

## 2023-06-19 DIAGNOSIS — R600 Localized edema: Secondary | ICD-10-CM | POA: Diagnosis not present

## 2023-06-19 DIAGNOSIS — I2510 Atherosclerotic heart disease of native coronary artery without angina pectoris: Secondary | ICD-10-CM | POA: Diagnosis not present

## 2023-06-19 LAB — COMPREHENSIVE METABOLIC PANEL WITH GFR
ALT: 32 U/L (ref 0–44)
AST: 28 U/L (ref 15–41)
Albumin: 3.6 g/dL (ref 3.5–5.0)
Alkaline Phosphatase: 46 U/L (ref 38–126)
Anion gap: 11 (ref 5–15)
BUN: 22 mg/dL (ref 8–23)
CO2: 25 mmol/L (ref 22–32)
Calcium: 10 mg/dL (ref 8.9–10.3)
Chloride: 103 mmol/L (ref 98–111)
Creatinine, Ser: 1.24 mg/dL (ref 0.61–1.24)
GFR, Estimated: 57 mL/min — ABNORMAL LOW (ref >60)
Glucose, Bld: 99 mg/dL (ref 70–99)
Potassium: 4 mmol/L (ref 3.5–5.1)
Sodium: 139 mmol/L (ref 135–145)
Total Bilirubin: 1.2 mg/dL (ref 0.0–1.2)
Total Protein: 6.4 g/dL — ABNORMAL LOW (ref 6.5–8.1)

## 2023-06-19 LAB — CBC
HCT: 41.1 % (ref 39.0–52.0)
Hemoglobin: 13.5 g/dL (ref 13.0–17.0)
MCH: 31.8 pg (ref 26.0–34.0)
MCHC: 32.8 g/dL (ref 30.0–36.0)
MCV: 96.7 fL (ref 80.0–100.0)
Platelets: 218 10*3/uL (ref 150–400)
RBC: 4.25 MIL/uL (ref 4.22–5.81)
RDW: 12.9 % (ref 11.5–15.5)
WBC: 5.8 10*3/uL (ref 4.0–10.5)
nRBC: 0 % (ref 0.0–0.2)

## 2023-06-19 LAB — RESP PANEL BY RT-PCR (RSV, FLU A&B, COVID)  RVPGX2
Influenza A by PCR: NEGATIVE
Influenza B by PCR: NEGATIVE
Resp Syncytial Virus by PCR: NEGATIVE
SARS Coronavirus 2 by RT PCR: NEGATIVE

## 2023-06-19 LAB — BRAIN NATRIURETIC PEPTIDE: B Natriuretic Peptide: 227.1 pg/mL — ABNORMAL HIGH (ref 0.0–100.0)

## 2023-06-19 MED ORDER — FUROSEMIDE 10 MG/ML IJ SOLN
20.0000 mg | Freq: Once | INTRAMUSCULAR | Status: AC
Start: 2023-06-19 — End: 2023-06-19
  Administered 2023-06-19: 20 mg via INTRAVENOUS
  Filled 2023-06-19: qty 2

## 2023-06-19 NOTE — ED Notes (Signed)
 Called ccmd to add to monitor

## 2023-06-19 NOTE — Telephone Encounter (Signed)
 Pt c/o Shortness Of Breath: STAT if SOB developed within the last 24 hours or pt is noticeably SOB on the phone  1. Are you currently SOB (can you hear that pt is SOB on the phone)? A little at this time- last night it was worse  2. How long have you been experiencing SOB? yesterday  3. Are you SOB when sitting or when up moving around? both  4. Are you currently experiencing any other symptoms? no

## 2023-06-19 NOTE — Progress Notes (Deleted)
 Cardiology Clinic Note   Patient Name: [name] [name] Date of Encounter: 06/19/2023  Primary Care Provider:  McClanahan, Kyra, NP Primary Cardiologist:  Peter Swaziland, MD  Patient Profile     [name] [name]   Past Medical History    Past Medical History:  Diagnosis Date   Alzheimer disease [facility])    Chronic kidney disease    mild insuffiency   Coronary artery disease    LHC 4/10: Mid LAD 40-50%, then 70%, OM1 20-30%, EF 65%. Mid LAD FFR 0.89 (not hemodynamically significant). Medical therapy was continued.   Dysuria 08/10/2020   Hyperlipidemia    LBBB (left bundle branch block) 06/2018   Obstructive sleep apnea    Past Surgical History:  Procedure Laterality Date   CARDIAC CATHETERIZATION  05/02/2003   single vessel,moderate stenosis mid LAD   CARDIAC CATHETERIZATION  06/12/2008   continue med. therapy   right shoulder surgery     TOTAL HIP ARTHROPLASTY Left 09/14/2018   Procedure: LEFT TOTAL HIP ARTHROPLASTY ANTERIOR APPROACH;  Surgeon: Arnie Lao, MD;  Location: [facility];  Service: Orthopedics;  Laterality: Left;    Allergies  Allergies  Allergen Reactions   Enablex [Darifenacin Hydrobromide Er]     PT STATES IT MAKES HIM "BONKERS" AND VERY TIRED   Atorvastatin Other (See Comments)    Muscle aches/weakness  Other reaction(s): Other (See Comments)  Muscle aches/weakness  Muscle aches/weakness  Muscle aches/weakness  Muscle aches/weakness  Muscle aches/weakness  Muscle aches/weakness   Ceftriaxone Other (See Comments)    Unknown reaction  Other reaction(s): Other (See Comments)  Unknown reaction  Unknown reaction    Unknown reaction  Unknown reaction  Unknown reaction   Ezetimibe Other (See Comments)    Muscle aches/weakness  Other reaction(s): Other (See Comments)  Muscle aches/weakness  Muscle aches/weakness  Muscle aches/weakness   Levaquin [Levofloxacin] Diarrhea   Metoprolol  Rash   Oxybutynin Other (See Comments)    Memory  impairment, temperament changes   Sulfamethoxazole Other (See Comments)    Childhood allergy   Sulfasalazine Other (See Comments)    Childhood allergy   Sulfonamide Derivatives Other (See Comments)    Childhood allergy    History of Present Illness    ***  Home Medications    Prior to Admission medications   Medication Sig Start Date End Date Taking? Authorizing Provider  acetaminophen  (TYLENOL ) 325 MG tablet Take 2 tablets (650 mg total) by mouth every 6 (six) hours as needed for mild pain, fever or headache. 11/22/22   Akula, Vijaya, MD  cholecalciferol (VITAMIN D3) 25 MCG (1000 UNIT) tablet Take 1,000 Units by mouth in the morning and at bedtime.    [provider]  diclofenac  Sodium (VOLTAREN ) 1 % GEL Apply 4 g topically 4 (four) times daily. 11/22/22   Akula, Vijaya, MD  donepezil  (ARICEPT ) 10 MG tablet Take 10 mg by mouth at bedtime. 12/01/16   [provider]  doxycycline  (VIBRAMYCIN ) 100 MG capsule Take 100 mg by mouth 2 (two) times daily. 08/13/22   [provider]  ELIQUIS  5 MG TABS tablet TAKE 1 TABLET(5 MG) BY MOUTH TWICE DAILY 06/09/23   [place], Peter M, MD  fesoterodine  (TOVIAZ ) 8 MG TB24 tablet Take 8 mg by mouth daily. 12/13/18   [provider]  furosemide  (LASIX ) 20 MG tablet Take 1 tablet (20 mg total) by mouth daily. 09/07/20   Goodrich, Callie E, PA-C  ketoconazole (NIZORAL) 2 % cream Apply 1 application topically daily. 02/27/21  [provider]  metroNIDAZOLE  (METROGEL ) 1 % gel Apply 1 Application topically daily.    [provider]  mirabegron  ER (MYRBETRIQ ) 50 MG TB24 tablet Take 50 mg by mouth every morning.    [provider]  Misc Natural Products (OSTEO BI-FLEX/5-LOXIN ADVANCED) TABS Take 1 tablet by mouth 2 (two) times daily.    [provider]  Multiple Vitamin (MULTIVITAMIN WITH MINERALS) TABS tablet Take 1 tablet by mouth every morning.    [provider]  nitrofurantoin ,  macrocrystal-monohydrate, (MACROBID ) 100 MG capsule Take 1 capsule (100 mg total) by mouth in the morning. Patient taking differently: Take 100 mg by mouth every evening. 01/23/21   Gonfa, Taye T, MD  Omega-3 Fatty Acids  (FISH OIL ) 1200 MG CAPS Take 1,200 mg by mouth 2 (two) times daily.    [provider]  PRESCRIPTION MEDICATION Inhale into the lungs at bedtime. CPAP    [provider]  rosuvastatin  (CRESTOR ) 10 MG tablet TAKE 1/2 TABLET BY MOUTH EVERY OTHER DAY 06/09/23   [place], Peter M, MD  terbinafine (LAMISIL) 1 % cream Apply 1 Application topically daily.    [provider]  triamcinolone  cream (KENALOG ) 0.1 % Apply 1 Application topically 2 (two) times daily. 10/13/22   [provider]    Family History    Family History  Problem Relation Age of Onset   Cancer Mother    Hypertension Mother    Other Father        old age   Hypertension Father    He indicated that his mother is deceased. He indicated that his father is deceased. He indicated that his sister is alive. He indicated that his brother is alive.  Social History    Social History   Socioeconomic History   Marital status: Widowed    Spouse name: Not on file   Number of children: 2   Years of education: Not on file   Highest education level: Not on file  Occupational History   Occupation: [employer]  Tobacco Use   Smoking status: Former    Types: Pipe    Quit date: 02/28/1990    Years since quitting: 33.3   Smokeless tobacco: Never   Tobacco comments:    smoked pipe only   Vaping Use   Vaping status: Never Used  Substance and Sexual Activity   Alcohol use: No    Alcohol/week: 0.0 standard drinks of alcohol    Comment: rarely wine   Drug use: No   Sexual activity: Not Currently    Partners: Female    Comment: widowed  Other Topics Concern   Not on file  Social History Narrative   Not on file   Social Drivers of Health   Financial Resource Strain: Low Risk   (06/08/2023)   Received from [facility]   Overall Financial Resource Strain (CARDIA)    Difficulty of Paying Living Expenses: Not very hard  Food Insecurity: No Food Insecurity (06/08/2023)   Received from [facility]   Hunger Vital Sign    Worried About Running Out of Food in the Last Year: Never true    Ran Out of Food in the Last Year: Never true  Transportation Needs: No Transportation Needs (06/08/2023)   Received from [facility] - Transportation    Lack of Transportation (Medical): No    Lack of Transportation (Non-Medical): No  Physical Activity: Inactive (06/08/2023)   Received from [facility]   Exercise Vital Sign  Days of Exercise per Week: 0 days    Minutes of Exercise per Session: 10 min  Stress: No Stress Concern Present (06/08/2023)   Received from [facility] - Occupational Stress Questionnaire    Feeling of Stress : Not at all  Social Connections: Moderately Integrated (06/08/2023)   Received from [facility]   Social Network    How would you rate your social network (family, work, friends)?: Adequate participation with social networks  Intimate Partner Violence: Not At Risk (06/08/2023)   Received from [facility]   HITS    Over the last 12 months how often did your partner physically hurt you?: Never    Over the last 12 months how often did your partner insult you or talk down to you?: Never    Over the last 12 months how often did your partner threaten you with physical harm?: Never    Over the last 12 months how often did your partner scream or curse at you?: Never     Review of Systems    General:  No chills, fever, night sweats or weight changes.  Cardiovascular:  No chest pain, dyspnea on exertion, edema, orthopnea, palpitations, paroxysmal nocturnal dyspnea. Dermatological: No rash, lesions/masses Respiratory: No cough, dyspnea Urologic: No hematuria, dysuria Abdominal:   No nausea,  vomiting, diarrhea, bright red blood per rectum, melena, or hematemesis Neurologic:  No visual changes, wkns, changes in mental status. All other systems reviewed and are otherwise negative except as noted above.  Physical Exam    VS:  There were no vitals taken for this visit. , BMI There is no height or weight on file to calculate BMI. GEN: Well nourished, well developed, in no acute distress. HEENT: normal. Neck: Supple, no JVD, carotid bruits, or masses. Cardiac: RRR, no murmurs, rubs, or gallops. No clubbing, cyanosis, edema.  Radials/DP/PT 2+ and equal bilaterally.  Respiratory:  Respirations regular and unlabored, clear to auscultation bilaterally. GI: Soft, nontender, nondistended, BS + x 4. MS: no deformity or atrophy. Skin: warm and dry, no rash. Neuro:  Strength and sensation are intact. Psych: Normal affect.  Accessory Clinical Findings    Recent Labs: 11/13/2022: ALT 26 11/14/2022: Magnesium  2.2 03/17/2023: B Natriuretic Peptide 86.4; BUN 19; Creatinine, Ser 1.31; Hemoglobin 13.4; Platelets 221; Potassium 3.9; Sodium 137   Recent Lipid Panel No results found for: "CHOL", "TRIG", "HDL", "CHOLHDL", "VLDL", "LDLCALC", "LDLDIRECT"  No BP recorded.  {Refresh Note OR Click here to enter BP  :1}***    ECG personally reviewed by me today- ***          Assessment & Plan   1.  ***   Chet Cota. Tam Delisle NP-C     06/19/2023, 10:07 AM [facility] [address] Office [phone] Fax [phone]    I spent***minutes examining this patient, reviewing medications, and using patient centered shared decision making involving their cardiac care.   I spent  20 minutes reviewing past medical history,  medications, and prior cardiac tests.

## 2023-06-19 NOTE — ED Triage Notes (Signed)
 Pt reports intermittent SHOB. Hx of CHF. Contacted cardiologist and was told to take extra dose of Lasix  and come to the ED, if there is no improvement. Denies any CP.

## 2023-06-19 NOTE — Telephone Encounter (Signed)
 Daughter calling c/o of patient being more SOB since yesterday. Patient blood pressure this morning 141/ 66, hr 63.  Daughter verbalized  patient is sob lying down and sitting up.  Per daughter nothing has helped sob. Daughter reports sitting down  usually makes it better. Patient  reported weight 196.1  yesterday and today 196.0 .Per daughter patient took furosemide   20mg  at 9am then took another dose at 5pm.  Appt made to be seen by provider in office today 4/21/.

## 2023-06-19 NOTE — Discharge Instructions (Addendum)
 We evaluated you for your shortness of breath.  We believe your shortness of breath is due to fluid buildup.  We gave you an extra dose of Lasix  in the emergency department.  Please increase your Lasix  dose to twice daily for the next 3 days.  We have placed a referral to cardiology for expedited follow-up.  Your x-ray showed atelectasis which is a compressed area of the lung.  This is not dangerous.  The radiologist recommends obtaining a repeat x-ray in 4 to 6 weeks to compare.  Please return if your symptoms worsen or you develop any new symptoms such as chest pain, fevers, productive cough, increasing shortness of breath, weakness, or any other new symptoms.

## 2023-06-19 NOTE — ED Notes (Signed)
 Pt placed on purewick due to receiving lasix .

## 2023-06-19 NOTE — Telephone Encounter (Signed)
 Late entry:  discussed with Lawana Pray, FNP-C @1010am  called daughter per DPR, go to the ER to be evaluated. Daughter verbalized understanding she will take pt to the ER for eval.

## 2023-06-19 NOTE — ED Provider Notes (Signed)
 [place] [facility] Provider Note  CSN: [id number] Arrival date & time: 06/19/23 1033  Chief Complaint(s) Shortness of Breath  HPI [name] [name] is a 83 y.o. male history of Alzheimer, CKD, coronary artery disease, diastolic CHF presenting to the emergency department shortness of breath.  Patient lately having some increased shortness of breath over the past few days.  Also having some dyspnea with exertion.  No chest pain.  No fevers or chills.  Has had some dry cough.  No abdominal pain, lightheadedness or dizziness.  No syncope.  Symptoms overall mild.  Daughter called the cardiology office yesterday, was scheduled to have an appointment today, however on the way to the appointment, was apparently called and told to go to the ER instead, she does not know why.  He did take a dose of his Lasix  yesterday and actually feels a little bit better today.   Past Medical History Past Medical History:  Diagnosis Date   Alzheimer disease (HCC)    Chronic kidney disease    mild insuffiency   Coronary artery disease    LHC 4/10: Mid LAD 40-50%, then 70%, OM1 20-30%, EF 65%. Mid LAD FFR 0.89 (not hemodynamically significant). Medical therapy was continued.   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   Lactic acidosis 04/02/2021   Paroxysmal atrial fibrillation (HCC) 04/02/2021   SIRS (systemic inflammatory response syndrome) (HCC) 04/02/2021   Acute respiratory distress 04/02/2021   Dementia with behavioral disturbance (HCC) 04/02/2021   Sepsis (HCC) 01/08/2021   Neck infection 01/08/2021   Encephalopathy due to COVID-19 virus 01/08/2021   Transaminitis 01/08/2021   Sepsis secondary to UTI (HCC) 09/11/2020   Dysuria 08/10/2020   Acute parotitis 07/27/2020    Facial cellulitis 07/26/2020   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   Primary osteoarthritis of left hip 09/06/2018   Alzheimer's dementia (HCC) 12/07/2016   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   Venous stasis dermatitis of left lower extremity 04/07/2016   Bilateral impacted cerumen 01/18/2016   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   Unsteadiness 01/18/2016   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   Cellulitis of left lower leg    Left leg swelling    Cellulitis 08/23/2014   Chronic diastolic CHF-EF noted to be 60- 65% ( grade 1 diastolic dysfunction by TTE in 04/2020) 04/29/2014   DOE (dyspnea on exertion) 11/12/2011   Stage 3a chronic kidney disease (HCC)    Thrombocytopenia, unspecified (HCC) 07/29/2010   Vitamin D deficiency 05/19/2010   Depression, major, recurrent, 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   Urinary urgency 05/19/2010   Vertigo, 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   Benign essential hypertension 12/23/2009   Actinic keratosis 12/23/2009   Seborrheic keratosis 03/18/2009   Benign neoplasm 03/18/2009   Dyslipidemia 01/18/2008   OSA on CPAP 01/18/2008   Coronary atherosclerosis 01/18/2008   Atherosclerotic heart disease of native coronary artery without angina pectoris 01/18/2008   Cataract in degenerative disorder 11/21/2006   Unspecified protein-calorie malnutrition (HCC) 09/27/2006   Gastro-esophageal reflux disease with esophagitis 09/15/2006   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 Authorizing Provider  acetaminophen  (TYLENOL ) 325 MG tablet Take 2 tablets (650 mg total) by mouth every 6 (six) hours as needed for mild pain, fever or headache. 11/22/22   Akula, Vijaya, MD  cholecalciferol (VITAMIN  D3) 25 MCG (1000 UNIT) tablet Take 1,000 Units by mouth in the morning and at bedtime.    [provider]  diclofenac  Sodium (VOLTAREN ) 1 % GEL Apply 4 g topically 4 (four) times daily. 11/22/22   Akula, Vijaya, MD  donepezil  (ARICEPT ) 10 MG tablet Take 10 mg by mouth at bedtime. 12/01/16   [provider]  doxycycline  (VIBRAMYCIN ) 100 MG capsule Take 100 mg by mouth 2 (two) times daily. 08/13/22   [provider]  ELIQUIS  5 MG TABS tablet TAKE 1 TABLET(5 MG) BY MOUTH TWICE DAILY 06/09/23   [place], Peter M, MD  fesoterodine  (TOVIAZ ) 8 MG TB24 tablet Take 8 mg by mouth daily. 12/13/18   [provider]  furosemide  (LASIX ) 20 MG tablet Take 1 tablet (20 mg total) by mouth daily. 09/07/20   Goodrich, Callie E, PA-C  ketoconazole (NIZORAL) 2 % cream Apply 1 application topically daily. 02/27/21   [provider]  metroNIDAZOLE  (METROGEL ) 1 % gel Apply 1 Application topically daily.    [provider]  mirabegron  ER (MYRBETRIQ ) 50 MG TB24 tablet Take 50 mg by mouth every morning.    [provider]  Misc Natural Products (OSTEO BI-FLEX/5-LOXIN ADVANCED) TABS Take 1 tablet by mouth 2 (two) times daily.    [provider]  Multiple Vitamin (MULTIVITAMIN WITH MINERALS) TABS tablet Take 1 tablet by mouth every morning.    [provider]  nitrofurantoin , macrocrystal-monohydrate, (MACROBID ) 100 MG capsule Take 1 capsule (100 mg total) by mouth in the morning. Patient taking differently: Take 100 mg by mouth every evening. 01/23/21   Gonfa, Taye T, MD  Omega-3 Fatty Acids  (FISH OIL ) 1200 MG CAPS Take 1,200 mg by mouth 2 (two) times daily.    [provider]  PRESCRIPTION MEDICATION Inhale into the lungs at bedtime. CPAP    [provider]  rosuvastatin  (CRESTOR ) 10 MG tablet TAKE 1/2 TABLET BY MOUTH EVERY OTHER DAY 06/09/23   [place], Peter M, MD  terbinafine (LAMISIL) 1 % cream Apply 1 Application topically daily.     [provider]  triamcinolone  cream (KENALOG ) 0.1 % Apply 1 Application topically 2 (two) times daily. 10/13/22   [provider]                                                                                                                                    Past Surgical History Past Surgical History:  Procedure Laterality Date   CARDIAC CATHETERIZATION  05/02/2003   single vessel,moderate stenosis mid LAD   CARDIAC CATHETERIZATION  06/12/2008   continue med. therapy   right shoulder surgery  TOTAL HIP ARTHROPLASTY Left 09/14/2018   Procedure: LEFT TOTAL HIP ARTHROPLASTY ANTERIOR APPROACH;  Surgeon: Arnie Lao, MD;  Location: [facility];  Service: Orthopedics;  Laterality: Left;   Family History Family History  Problem Relation Age of Onset   Cancer Mother    Hypertension Mother    Other Father        old age   Hypertension Father     Social History Social History   Tobacco Use   Smoking status: Former    Types: Pipe    Quit date: 02/28/1990    Years since quitting: 33.3   Smokeless tobacco: Never   Tobacco comments:    smoked pipe only   Vaping Use   Vaping status: Never Used  Substance Use Topics   Alcohol use: No    Alcohol/week: 0.0 standard drinks of alcohol    Comment: rarely wine   Drug use: No   Allergies Enablex [darifenacin hydrobromide er], Atorvastatin, Ceftriaxone, Ezetimibe, Levaquin [levofloxacin], Metoprolol , Oxybutynin, Sulfamethoxazole, Sulfasalazine, and Sulfonamide derivatives  Review of Systems Review of Systems  All other systems reviewed and are negative.   Physical Exam Vital Signs  I have reviewed the triage vital signs BP (!) 174/76 (BP Location: Right Arm)   Pulse (!) 58   Temp (!) 97.5 F (36.4 C)   Resp 19   SpO2 99%  Physical Exam Vitals and nursing note reviewed.  Constitutional:      General: He is not in acute distress.    Appearance: Normal appearance.  HENT:     Mouth/Throat:     Mouth:  Mucous membranes are moist.  Eyes:     Conjunctiva/sclera: Conjunctivae normal.  Cardiovascular:     Rate and Rhythm: Normal rate and regular rhythm.  Pulmonary:     Effort: Pulmonary effort is normal. No respiratory distress.     Breath sounds: Normal breath sounds.  Abdominal:     General: Abdomen is flat.     Palpations: Abdomen is soft.     Tenderness: There is no abdominal tenderness.  Musculoskeletal:     Right lower leg: Edema present.     Left lower leg: Edema present.  Skin:    General: Skin is warm and dry.     Capillary Refill: Capillary refill takes less than 2 seconds.  Neurological:     Mental Status: He is alert and oriented to person, place, and time. Mental status is at baseline.  Psychiatric:        Mood and Affect: Mood normal.        Behavior: Behavior normal.     ED Results and Treatments Labs (all labs ordered are listed, but only abnormal results are displayed) Labs Reviewed  RESP PANEL BY RT-PCR (RSV, FLU A&B, COVID)  RVPGX2  COMPREHENSIVE METABOLIC PANEL WITH GFR  CBC  BRAIN NATRIURETIC PEPTIDE  Radiology No results found.  Pertinent labs & imaging results that were available during my care of the patient were reviewed by me and considered in my medical decision making (see MDM for details).  Medications Ordered in ED Medications  furosemide  (LASIX ) injection 20 mg (has no administration in time range)                                                                                                                                     Procedures Procedures  (including critical care time)  Medical Decision Making / ED Course   MDM:  83 year old presenting to the emergency department with shortness of breath.  Patient overall well-appearing, does have some lower extremity swelling.  Suspect symptoms most likely due to  mild CHF exacerbation.  He actually reports feeling better today after receiving extra dose of Lasix  yesterday.  He is on a relatively low dose of Lasix .  He has lower extremity edema.  Will give 1 dose of IV Lasix  here.  Will check labs including CBC, CMP.  Lower concern for alternative process, but will check chest x-ray, evaluate for pneumonia, pneumothorax, pulmonary edema.  No chest pain to suggest ACS or PE, patient not hypoxic or tachycardic as well.  Will reassess.  Given lack of hypoxia, respiratory distress, do not believe patient will need to be admitted.  Will reassess.      Additional history obtained: -Additional history obtained from {wsadditionalhistorian:28072} -External records from outside source obtained and reviewed including: Chart review including previous notes, labs, imaging, consultation notes including ***   Lab Tests: -I ordered, reviewed, and interpreted labs.   The pertinent results include:   Labs Reviewed  RESP PANEL BY RT-PCR (RSV, FLU A&B, COVID)  RVPGX2  COMPREHENSIVE METABOLIC PANEL WITH GFR  CBC  BRAIN NATRIURETIC PEPTIDE    Notable for ***  EKG  Sinus rhythm with left bundle branch block   Imaging Studies ordered: I ordered imaging studies including *** On my interpretation imaging demonstrates *** I independently visualized and interpreted imaging. I agree with the radiologist interpretation   Medicines ordered and prescription drug management: Meds ordered this encounter  Medications   furosemide  (LASIX ) injection 20 mg    -I have reviewed the patients home medicines and have made adjustments as needed   Consultations Obtained: I requested consultation with the ***,  and discussed lab and imaging findings as well as pertinent plan - they recommend: ***   Cardiac Monitoring: The patient was maintained on a cardiac monitor.  I personally viewed and interpreted the cardiac monitored which showed an underlying rhythm of: ***  Social  Determinants of Health:  Diagnosis or treatment significantly limited by social determinants of health: {wssoc:28071}   Reevaluation: After the interventions noted above, I reevaluated the patient and found that their symptoms have {resolved/improved/worsened:23923::"improved"}  Co morbidities that complicate the patient evaluation  Past Medical History:  Diagnosis Date  Alzheimer disease (HCC)    Chronic kidney disease    mild insuffiency   Coronary artery disease    LHC 4/10: Mid LAD 40-50%, then 70%, OM1 20-30%, EF 65%. Mid LAD FFR 0.89 (not hemodynamically significant). Medical therapy was continued.   Dysuria 08/10/2020   Hyperlipidemia    LBBB (left bundle branch block) 06/2018   Obstructive sleep apnea       Dispostion: Disposition decision including need for hospitalization was considered, and patient {wsdispo:28070::"discharged from emergency department."}    Final Clinical Impression(s) / ED Diagnoses Final diagnoses:  None     This chart was dictated using voice recognition software.  Despite best efforts to proofread,  errors can occur which can change the documentation meaning.

## 2023-06-19 NOTE — ED Provider Triage Note (Signed)
 Emergency Medicine Provider Triage Evaluation Note  [name] [name] , a 83 y.o. male  was evaluated in triage.  Pt complains of dyspnea.  Patient had history of CHF.  He has some increased weight gain.  He was given an extra dose of his Lasix .  He is on Lasix  20 mg daily and per family member, he they have been told by their cardiac doctors to take an extra dose of Lasix  if he started to get short of breath.  She gave him an extra dose of Lasix  of 20 mg.  He may have some increased weight today.  He has not had fever or chills.  He denies chest pain.  Review of Systems  Positive: Dyspnea Negative: Chest pain  Physical Exam  BP (!) 174/76 (BP Location: Right Arm)   Pulse (!) 58   Temp (!) 97.5 F (36.4 C)   Resp 19   SpO2 99%  Gen:   Awake, no distress   Resp:  Normal effort  MSK:   Moves extremities without difficulty  Other:  Some edema on legs reported to be at baseline  Medical Decision Making  Medically screening exam initiated at 11:04 AM.  Appropriate orders placed.  [name] [name] was informed that the remainder of the evaluation will be completed by another provider, this initial triage assessment does not replace that evaluation, and the importance of remaining in the ED until their evaluation is complete.  Will order chest x-Ivadell [name], labs, BNP   [name] Blush, MD 06/19/23 1105

## 2023-06-26 ENCOUNTER — Encounter (HOSPITAL_BASED_OUTPATIENT_CLINIC_OR_DEPARTMENT_OTHER): Payer: Self-pay | Admitting: Family

## 2023-06-26 ENCOUNTER — Ambulatory Visit (INDEPENDENT_AMBULATORY_CARE_PROVIDER_SITE_OTHER): Admitting: Family

## 2023-06-26 VITALS — BP 134/76 | HR 66 | Ht 69.0 in | Wt 202.8 lb

## 2023-06-26 DIAGNOSIS — I25118 Atherosclerotic heart disease of native coronary artery with other forms of angina pectoris: Secondary | ICD-10-CM

## 2023-06-26 DIAGNOSIS — R0609 Other forms of dyspnea: Secondary | ICD-10-CM | POA: Diagnosis not present

## 2023-06-26 DIAGNOSIS — E785 Hyperlipidemia, unspecified: Secondary | ICD-10-CM

## 2023-06-26 DIAGNOSIS — I10 Essential (primary) hypertension: Secondary | ICD-10-CM | POA: Diagnosis not present

## 2023-06-26 DIAGNOSIS — I5032 Chronic diastolic (congestive) heart failure: Secondary | ICD-10-CM

## 2023-06-26 MED ORDER — FUROSEMIDE 20 MG PO TABS: ORAL_TABLET | ORAL | 3 refills | Status: DC

## 2023-06-26 NOTE — Patient Instructions (Addendum)
 Medication Instructions:  Your physician has recommended you make the following change in your medication:   Continue Lasix  20mg  daily- you may take an extra tablet if you have swelling, gain 2 pounds overnight or 5 pounds in one week! Please let us  know if you are taking an extra tablet more than once per week.   *If you need a refill on your cardiac medications before your next appointment, please call your pharmacy*  Lab Work: BMP & BNP today   Follow-Up: At [facility], you and your health needs are our priority.  As part of our continuing mission to provide you with exceptional heart care, our providers are all part of one team.  This team includes your primary Cardiologist (physician) and Advanced Practice Providers or APPs (Physician Assistants and Nurse Practitioners) who all work together to provide you with the care you need, when you need it.  Your next appointment:   July with Dr. Swaziland   Other Instructions Recommend restricting to 64 oz of fluid or less per day  Heart Healthy Diet Recommendations: A low-salt diet is recommended. Meats should be grilled, baked, or boiled. Avoid fried foods. Focus on lean protein sources like fish or chicken with vegetables and fruits. The American Heart Association is a Chief Technology Officer!  American Heart Association Diet and Lifeystyle Recommendations   On the nutrition label, recommend aiming for things that are <15% of your daily sodium  Recommend weighing daily and keeping a log. If you have weight gain of 2 pounds overnight or 5 pounds in 1 week, take an extra tablet of Lasix .

## 2023-06-26 NOTE — Progress Notes (Unsigned)
 Cardiology Office Note:  .   Date:  06/26/2023  ID:  [name] [name], DOB 1940-09-05, MRN [id number] PCP: Jewell Mose, NP  [facility] Providers Cardiologist:  Peter Swaziland, MD Cardiology APP:  Lamond Pilot, PA { Click to update primary MD,subspecialty MD or APP then REFRESH:1}   History of Present Illness: .   [name] [name] is a 83 y.o. male with hx of Alzheimer's, CKD III, LBBB, CAD (LHC 2010 nonobstructive CAD, FFR on LAS 2010 negative, myoview  06/2018 low risk), HFpEF, OSA on CPAP, atrial fibrillation, HLD  Admitted 12/2020 with sepsis due to deep neck infecction with new onset atrial fibrillation. Eliquis  started in outpatient setting.   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  Seen 08/2021 with 2-week monitor with no recurrent atrial fibrillation and some nonsustained runs of SVT.  Junctional rhythm with rate 55-6 bpm seem to correlate with symptoms. Carvedilol  subsequently discontinued in clinic for bradycardia.  Admitted 10/2022 for LE cellulitis with mild acute on chronic diastolic heart failure.  Echo preserved LVEF,gr1dd.  Lasix  had to be discontinued due to hypotension.  ED visit 06/19/23 given IV Lasix  for volume overload. BNP 227.1. CXR with retrocardiac atelectasis vs infiltrate with plan to repeat x-ray in 4-6 weeks.   Presents today for follow up with family member. Activity limited by left knee and shoulder pain with orthopedic evaluation upcoming. Family member notes he tires easily. Prior to ED visit reported weight had gone up "a little bit" ***. Does note some dietary indiscretion over the weekend with a family reunion. Dietary recommendations reviewed. Likely drinking > 64 oz. Reports no recent lightheadedness, dizziness.   Has been on macrobid  for UTI prevention, doxycycline  - for jhaw    ROS: Please see the history  of present illness.    All other systems reviewed and are negative.   Studies Reviewed: .        *** Risk Assessment/Calculations:    CHA2DS2-VASc Score = 5  {Confirm score is correct.  If not, click here to update score.  REFRESH note.  :1} This indicates a 7.2% annual risk of stroke. The patient's score is based upon: CHF History: 1 HTN History: 1 Diabetes History: 0 Stroke History: 0 Vascular Disease History: 1 Age Score: 2 Gender Score: 0   {This patient has a significant risk of stroke if diagnosed with atrial fibrillation.  Please consider VKA or DOAC agent for anticoagulation if the bleeding risk is acceptable.   You can also use the SmartPhrase .[email or web address] for documentation.   :[id number]}         Physical Exam:   VS:  BP 134/76   Pulse 66   Ht 5\' 9"  (1.753 m)   Wt 202 lb 12.8 oz (92 kg)   SpO2 96%   BMI 29.95 kg/m    Wt Readings from Last 3 Encounters:  06/26/23 202 lb 12.8 oz (92 kg)  03/21/23 268 lb (121.6 kg)  03/17/23 196 lb (88.9 kg)    GEN: Well nourished, well developed in no acute distress NECK: No JVD; No carotid bruits CARDIAC: ***RRR, no murmurs, rubs, gallops RESPIRATORY:  Clear to auscultation without rales, wheezing or rhonchi  ABDOMEN: Soft, non-tender, non-distended EXTREMITIES:  No edema; No deformity   ASSESSMENT AND PLAN: .    HFpEF - Ech0 10/2022 LVEF 70-75%, gr1dd, RV normal, mild MR/AI. Euvolemic and well  compensated on exam. Recent ED visit 06/19/23 requiring dose of IV Lasix  ***. Update BMET/BNP today.   HTN - BP controlled at goal <130/80. *** no recent lightheadedness, dizziness.   Nonobstructive CAD/HLD, LDL goal less than 70 -  Stable with no anginal symptoms. No indication for ischemic evaluation.   GDMT Rosuvastatin  5mg  every other day. BB previously discontinued due to bradycardia. No ASA due to OAC.   PAF/hypercoagulable state- Onset during hospitalization 12/2020 with sepsis.  No documented recurrence since then.  Event  monitor 09/2021 no atrial fibrillation.  Beta-blocker previously discontinued due to bradycardia.  Continue Eliquis  5mg  BID. Does not meet dose reduction criteria. CHA2DS2-VASc Score = 5 [CHF History: 1, HTN History: 1, Diabetes History: 0, Stroke History: 0, Vascular Disease History: 1, Age Score: 2, Gender Score: 0].  Therefore, the patient's annual risk of stroke is 7.2 %.           Dispo: follow up in July 2025 with Dr. Swaziland  Signed, Clearnce Curia, NP

## 2023-06-28 LAB — BASIC METABOLIC PANEL WITH GFR
BUN/Creatinine Ratio: 17 (ref 10–24)
BUN: 20 mg/dL (ref 8–27)
CO2: 23 mmol/L (ref 20–29)
Calcium: 10.3 mg/dL — ABNORMAL HIGH (ref 8.6–10.2)
Chloride: 105 mmol/L (ref 96–106)
Creatinine, Ser: 1.16 mg/dL (ref 0.76–1.27)
Glucose: 132 mg/dL — ABNORMAL HIGH (ref 70–99)
Potassium: 4.2 mmol/L (ref 3.5–5.2)
Sodium: 144 mmol/L (ref 134–144)
eGFR: 62 mL/min/{1.73_m2} (ref >59)

## 2023-06-28 LAB — BRAIN NATRIURETIC PEPTIDE: BNP: 89.2 pg/mL (ref 0.0–100.0)

## 2023-06-29 ENCOUNTER — Encounter (HOSPITAL_BASED_OUTPATIENT_CLINIC_OR_DEPARTMENT_OTHER): Payer: Self-pay

## 2023-07-05 ENCOUNTER — Other Ambulatory Visit: Payer: Self-pay

## 2023-07-05 ENCOUNTER — Encounter: Payer: Self-pay | Admitting: Orthopedic Surgery

## 2023-07-05 ENCOUNTER — Other Ambulatory Visit (INDEPENDENT_AMBULATORY_CARE_PROVIDER_SITE_OTHER): Payer: Self-pay

## 2023-07-05 ENCOUNTER — Ambulatory Visit (INDEPENDENT_AMBULATORY_CARE_PROVIDER_SITE_OTHER): Admitting: Orthopedic Surgery

## 2023-07-05 DIAGNOSIS — M25562 Pain in left knee: Secondary | ICD-10-CM

## 2023-07-05 DIAGNOSIS — M25511 Pain in right shoulder: Secondary | ICD-10-CM

## 2023-07-05 DIAGNOSIS — M1712 Unilateral primary osteoarthritis, left knee: Secondary | ICD-10-CM | POA: Diagnosis not present

## 2023-07-05 MED ORDER — BUPIVACAINE HCL 0.25 % IJ SOLN
4.0000 mL | INTRAMUSCULAR | Status: AC | PRN
  Administered 2023-07-05: 4 mL via INTRA_ARTICULAR

## 2023-07-05 MED ORDER — LIDOCAINE HCL 1 % IJ SOLN
5.0000 mL | INTRAMUSCULAR | Status: AC | PRN
  Administered 2023-07-05: 5 mL

## 2023-07-05 NOTE — Progress Notes (Signed)
 Office Visit Note   Patient: [name] [name]           Date of Birth: [birth year]           MRN: [id number] Visit Date: 07/05/2023 Requested by: Jewell Mose, NP [address] [place],  [place] [address] PCP: Jewell Mose, NP  Subjective: Chief Complaint  Patient presents with   Right Shoulder - Pain   Left Knee - Pain    HPI: [name] [name] is a 83 y.o. male who presents to the office reporting right shoulder and left knee pain.  Pain has been going on for many months.  Denies a history of discrete injury.  States is hard for him to lift that shoulder up overhead.  Also reports ambulatory pain from that left knee.  Has been told he is not really a candidate for surgery.  Has multiple other medical comorbidities.  Pain does not really wake him up from sleep at night but it does interfere with his ability to get out from a chair..                ROS: All systems reviewed are negative as they relate to the chief complaint within the history of present illness.  Patient denies fevers or chills.  Assessment & Plan: Visit Diagnoses:  1. Right shoulder pain, unspecified chronicity   2. 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  He also has fairly significant arthritis in the knee with an effusion.  Both of these areas are injected today.  Will see what kind of relief this gives him.  Could consider repeat injection in 3 to 4 months.  Follow-Up Instructions: No follow-ups on file.   Orders:  Orders Placed This Encounter  Procedures   XR KNEE 3 VIEW LEFT   XR Shoulder Right   No orders of the defined types were placed in this encounter.     Procedures: Large Joint Inj: R glenohumeral on 07/05/2023 9:39 AM Indications: diagnostic evaluation, joint swelling and pain Details: 22 G 3.5 in needle, ultrasound-guided  superolateral approach  Arthrogram: No  Medications: 5 mL lidocaine  1 %; 4 mL bupivacaine  0.25 % Outcome: tolerated well, no immediate complications Procedure, treatment alternatives, risks and benefits explained, specific risks discussed. Consent was given by the patient. Immediately prior to procedure a time out was called to verify the correct patient, procedure, equipment, support staff and site/side marked as required. Patient was prepped and draped in the usual sterile fashion.     Kenelog injected  Clinical Data: No additional findings.  Objective: Vital Signs: There were no vitals taken for this visit.  Physical Exam:  Constitutional: Patient appears well-developed HEENT:  Head: Normocephalic Eyes:EOM are normal Neck: Normal range of motion Cardiovascular: Normal rate Pulmonary/chest: Effort normal Neurologic: Patient is alert Skin: Skin is warm Psychiatric: Patient has normal mood and affect  Ortho Exam: Ortho exam demonstrates functional deltoid on the right.  Diminished range of motion with crepitus is present with passive range of motion.  Patient has range of motion on the right shoulder of about 20/75/80.  It is painful.  Left knee has range of motion of about 5-1 05 with mild effusion present.  Extensor mechanism intact.  Pedal pulses palpable.  No groin pain with internal/external rotation of that left leg.  No other masses adenopathy  or skin changes noted in the left knee region.  Specialty Comments:  No specialty comments available.  Imaging: No results found.   PMFS History: Patient Active Problem List   Diagnosis Date Noted   Asymptomatic bacteriuria 04/06/2021   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/03/2021   Lactic acidosis 04/02/2021   Paroxysmal atrial fibrillation (HCC) 04/02/2021   SIRS (systemic inflammatory response syndrome) (HCC) 04/02/2021   Acute respiratory distress 04/02/2021   Dementia with behavioral disturbance (HCC) 04/02/2021   Sepsis  (HCC) 01/08/2021   Neck infection 01/08/2021   Encephalopathy due to COVID-19 virus 01/08/2021   Transaminitis 01/08/2021   Sepsis secondary to UTI (HCC) 09/11/2020   Dysuria 08/10/2020   Acute parotitis 07/27/2020   Facial cellulitis 07/26/2020   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   Primary osteoarthritis of left hip 09/06/2018   Alzheimer's dementia (HCC) 12/07/2016   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   Venous stasis dermatitis of left lower extremity 04/07/2016   Bilateral impacted cerumen 01/18/2016   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   Unsteadiness 01/18/2016   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   Cellulitis of left lower leg    Left leg swelling    Cellulitis 08/23/2014   Chronic diastolic CHF-EF noted to be 60- 65% ( grade 1 diastolic dysfunction by TTE in 04/2020) 04/29/2014   DOE (dyspnea on exertion) 11/12/2011   Stage 3a chronic kidney disease (HCC)    Thrombocytopenia, unspecified (HCC) 07/29/2010   Vitamin D deficiency 05/19/2010   Depression, major, recurrent, 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   Urinary urgency 05/19/2010   Vertigo, 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   Benign essential hypertension 12/23/2009   Actinic keratosis 12/23/2009   Seborrheic keratosis 03/18/2009   Benign neoplasm 03/18/2009   Dyslipidemia 01/18/2008   OSA on CPAP 01/18/2008   Coronary atherosclerosis 01/18/2008   Atherosclerotic heart disease of native coronary artery without angina pectoris 01/18/2008   Cataract in degenerative disorder 11/21/2006   Unspecified protein-calorie malnutrition (HCC) 09/27/2006   Gastro-esophageal reflux disease with esophagitis 09/15/2006   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 Mid LAD FFR 0.89 (not hemodynamically significant). Medical therapy was continued.   Dysuria 08/10/2020   Hyperlipidemia    LBBB (left bundle branch block) 06/2018   Obstructive sleep apnea     Family History  Problem Relation Age of Onset   Cancer Mother    Hypertension Mother    Other Father        old age   Hypertension Father     Past Surgical History:  Procedure Laterality Date   CARDIAC CATHETERIZATION  05/02/2003   single vessel,moderate stenosis mid LAD   CARDIAC CATHETERIZATION  06/12/2008   continue med. therapy   right shoulder surgery     TOTAL HIP ARTHROPLASTY Left 09/14/2018   Procedure: LEFT TOTAL HIP ARTHROPLASTY ANTERIOR APPROACH;  Surgeon: Arnie Lao, MD;  Location: [facility];  Service: Orthopedics;  Laterality: Left;   Social History   Occupational History   Occupation: [employer]  Tobacco Use   Smoking status: Former    Types: Pipe  Quit date: 02/28/1990    Years since quitting: 33.3   Smokeless tobacco: Never   Tobacco comments:    smoked pipe only   Vaping Use   Vaping status: Never Used  Substance and Sexual Activity   Alcohol use: No    Alcohol/week: 0.0 standard drinks of alcohol    Comment: rarely wine   Drug use: No   Sexual activity: Not Currently    Partners: Female    Comment: widowed

## 2023-08-24 ENCOUNTER — Encounter: Payer: Self-pay | Admitting: Podiatry

## 2023-08-24 ENCOUNTER — Ambulatory Visit (INDEPENDENT_AMBULATORY_CARE_PROVIDER_SITE_OTHER): Admitting: Podiatry

## 2023-08-24 DIAGNOSIS — B351 Tinea unguium: Secondary | ICD-10-CM

## 2023-08-24 DIAGNOSIS — Q828 Other specified congenital malformations of skin: Secondary | ICD-10-CM

## 2023-08-24 DIAGNOSIS — M79675 Pain in left toe(s): Secondary | ICD-10-CM

## 2023-08-24 DIAGNOSIS — M79674 Pain in right toe(s): Secondary | ICD-10-CM | POA: Diagnosis not present

## 2023-08-24 DIAGNOSIS — Z7901 Long term (current) use of anticoagulants: Secondary | ICD-10-CM

## 2023-08-24 NOTE — Progress Notes (Signed)
  Subjective:  Patient ID: [name] [name], male    DOB: October 04, 1940,   MRN: [id number]  No chief complaint on file.   83 y.o. male presents for concern of thickened elongated and painful nails that are difficult to trim. Requesting to have them trimmed today. Cocnerned about left great toe crack as well as right pinky toenail.   Does relates some burning sensation in feet that come and go.  He is not diabetic but is currently on Eliquis .   PCP:  McClanahan, Kyra, NP    . Denies any other pedal complaints. Denies n/v/f/c.   Past Medical History:  Diagnosis Date   Alzheimer disease (HCC)    Chronic kidney disease    mild insuffiency   Coronary artery disease    LHC 4/10: Mid LAD 40-50%, then 70%, OM1 20-30%, EF 65%. Mid LAD FFR 0.89 (not hemodynamically significant). Medical therapy was continued.   Dysuria 08/10/2020   Hyperlipidemia    LBBB (left bundle branch block) 06/2018   Obstructive sleep apnea     Objective:  Physical Exam: Vascular: DP/PT pulses 2/4 bilateral. CFT <3 seconds. Absent hair growth on digits. Edema noted to bilateral lower extremities. Xerosis noted bilaterally.  Skin. No lacerations or abrasions bilateral feet. Nails 1-5 bilateral  are thickened discolored and elongated with subungual debris.  Mild erythema noted over dorsum of fifth digit appear to be rubbing from shoes. Hyperkeratotic tissue noted bilateral plantar fifth metatarsals with cored areas noted.  Musculoskeletal: MMT 5/5 bilateral lower extremities in DF, PF, Inversion and Eversion. Deceased ROM in DF of ankle joint. Hammered digits 2-5 bilateral.  Neurological: Sensation intact to light touch. Protective sensation intact bilateral.    Assessment:   1. Pain due to onychomycosis of toenails of both feet   2. Porokeratosis   3. Chronic anticoagulation         Plan:  Patient was evaluated and treated and all questions answered. -Discussed and educated patient on foot care, especially with   regards to the vascular, neurological and musculoskeletal systems.  -Discussed supportive shoes at all times and checking feet regularly.  -Mechanically debrided all nails 1-5 bilateral using sterile nail nipper and filed with dremel without incident  -Hyperkeratotic tissue x2 debrided bilateral fifth metatarsal heads without incident with chisel.  -Answered all patient questions -Patient to return  in 4 months for at risk foot care -Patient advised to call the office if any problems or questions arise in the meantime.   Asberry Failing, DPM

## 2023-09-07 ENCOUNTER — Ambulatory Visit: Admitting: Podiatry

## 2023-09-20 NOTE — Progress Notes (Unsigned)
 Office Visit    Patient Name: [name] [name] Date of Encounter: 09/22/2023  PCP:  McClanahan, Kyra, NP   [facility]  Cardiologist:  [name] Disla [place], MD  Advanced Practice Provider:  Madie Jon Garre, PA Electrophysiologist:  None      Chief Complaint    [name] [name] is a 83 y.o. male presents today for evaluation of SOB  Past Medical History    Past Medical History:  Diagnosis Date   Alzheimer disease (HCC)    Chronic kidney disease    mild insuffiency   Coronary artery disease    LHC 4/10: Mid LAD 40-50%, then 70%, OM1 20-30%, EF 65%. Mid LAD FFR 0.89 (not hemodynamically significant). Medical therapy was continued.   Dysuria 08/10/2020   Hyperlipidemia    LBBB (left bundle branch block) 06/2018   Obstructive sleep apnea    Past Surgical History:  Procedure Laterality Date   CARDIAC CATHETERIZATION  05/02/2003   single vessel,moderate stenosis mid LAD   CARDIAC CATHETERIZATION  06/12/2008   continue med. therapy   right shoulder surgery     TOTAL HIP ARTHROPLASTY Left 09/14/2018   Procedure: LEFT TOTAL HIP ARTHROPLASTY ANTERIOR APPROACH;  Surgeon: [name] Lonni GRADE, MD;  Location: [facility];  Service: Orthopedics;  Laterality: Left;    Allergies  Allergies  Allergen Reactions   Darifenacin Other (See Comments) and Anaphylaxis    PT STATES IT MAKES HIM BONKERS AND VERY TIRED  Other Reaction(s): Confusion, Confusion/Altered Mental Status  PT STATES IT MAKES HIM BONKERS AND VERY TIRED, Other reaction(s): Mental Status Changes (intolerance), PT STATES IT MAKES HIM BONKERS AND VERY TIRED, PT STATES IT MAKES HIM BONKERS AND VERY TIRED   Enablex [Darifenacin Hydrobromide Er]     PT STATES IT MAKES HIM BONKERS AND VERY TIRED   Atorvastatin Other (See Comments)    Muscle aches/weakness  Other reaction(s): Other (See Comments)  Muscle aches/weakness  Muscle aches/weakness  Muscle aches/weakness  Muscle aches/weakness   Muscle aches/weakness  Muscle aches/weakness  Other reaction(s): Other (See Comments), Muscle aches/weakness, Muscle aches/weakness, Muscle aches/weakness, Muscle aches/weakness  Muscle aches/weakness    Other reaction(s): Other (See Comments) Muscle aches/weakness Muscle aches/weakness Muscle aches/weakness Muscle aches/weakness Muscle aches/weakness Muscle aches/weakness    Other reaction(s): Other (See Comments), Muscle aches/weakness, Muscle aches/weakness, Muscle aches/weakness, Muscle aches/weakness    Muscle aches/weakness  Other reaction(s): Other (See Comments) Muscle aches/weakness Muscle aches/weakness Muscle aches/weakness Muscle aches/weakness Muscle aches/weakness Muscle aches/weakness   Ceftriaxone Other (See Comments)    Unknown reaction  Other reaction(s): Other (See Comments)  Unknown reaction  Unknown reaction    Unknown reaction  Unknown reaction  Unknown reaction  Other reaction(s): Other (See Comments), Unknown reaction, Unknown reaction, , Unknown reaction  Unknown reaction    Other reaction(s): Other (See Comments) Unknown reaction Unknown reaction  Unknown reaction Unknown reaction Unknown reaction    Other reaction(s): Other (See Comments), Unknown reaction, Unknown reaction, , Unknown reaction    Unknown reaction  Other reaction(s): Other (See Comments) Unknown reaction Unknown reaction  Unknown reaction Unknown reaction Unknown reaction   Ezetimibe Other (See Comments)    Muscle aches/weakness  Other reaction(s): Other (See Comments)  Muscle aches/weakness  Muscle aches/weakness  Muscle aches/weakness  Other reaction(s): Other (See Comments), Muscle aches/weakness, Muscle aches/weakness  Other reaction(s): Other (See Comments) Muscle aches/weakness Muscle aches/weakness Muscle aches/weakness    Other reaction(s): Other (See Comments), Muscle aches/weakness, Muscle aches/weakness    Muscle aches/weakness  Other reaction(s): Other (See  Comments)  Muscle aches/weakness Muscle aches/weakness Muscle aches/weakness   Metoprolol  Rash and Dermatitis   Oxybutynin Other (See Comments)    Memory impairment, temperament changes  Other Reaction(s): Confusion  Other reaction(s): Mental Status Changes (intolerance), Memory impairment, temperament changes, Memory impairment, temperament changes, Memory impairment, temperament changes, , Memory impairment, temperament changes   Sulfamethoxazole Other (See Comments)    Childhood allergy  Other reaction(s): Other (See Comments), Childhood allergy, Childhood allergy   Sulfasalazine Other (See Comments)    Childhood allergy  Other reaction(s): Other (See Comments), Unknown per daughter, Unknown per daughter   Levofloxacin Diarrhea and Other (See Comments)    Other Reaction(s): Mental Status Changes  Caused diarheer a   Sulfonamide Derivatives Other (See Comments)    Childhood allergy    History of Present Illness    [name] [name] is a 83 y.o. male with a hx of Aslzheimer's disease, diastolic heart failure, OSA on CPAP, HTN, HLD, moderate nonobstructive CAD on cardiac cath 2010 (FFR on LAD 2010 negative, Myoview  06/2018 low risk) , chronic shortness of breath, LBBB, KCD III, atrial fibrillation   Admitted 12/2020 with sepsis due to deep neck infection. Agitation and infection resolved with Decadron  and abx. EKG with atrial fibrillation. Rate controlled at rest but 140s with activity. Low dose Metoprolol  and OAC were discussed but not initiated. Eliquis  subsequently started in outpatient setting.   Admitted 03/2021 with AMS and SIRS due to COVID. 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 At follow up 04/20/21 still short of breath with bending or turning which was chronic. No atrial fibrillation during this hospitalization.   Daughter contacted the office 09/15/21 noting increased episodes of atrial fib with fatigue,  shortness of breath. Was seen by [name] Finder NP on 7/26 and was in NSR. Labs were done and were OK. A 2 week event monitor was placed. This showed no Afib. Some nonsustained runs of SVT. Junctional rhythm with rate 55-60 seemed to correlate with symptoms.   He presented to the ED on 12/23/2021 with shortness of breath and fatigue.  Basic metabolic panel showed creatinine of 1.34.  BNP 285 which is lower than a month ago.  Troponin negative x2 chest x-ray showed no acute issue.  COVID test negative. Was seen in follow up with Hao Meng PA-C- his lasix  was increased for a couple of days. His Coreg  was discontinued due to findings of bradycardia intermittently. Echo was done and showed normal LV EF with [name] 2 diastolic dysfunction.  PFTS were also arranged. This looked OK with mildly reduced DLCO.    He was seen in ED in Dec 2023 with LLE cellulitis. Was placed on doxycycline . Also had recurrent parotiditis. States symptoms persisted and went back to ED 03/20/22. Switched doxycycline  to clindamycin . Was noted in ED to have marked bradycardia with HR down to high 30s with sinus brady seen on ECG with rate 41. Since then BP and HR ok at home. Cellulitis improved.   He was admitted in September 2024 with LE cellulitis. He had mild acute on chronic diastolic heart failure.Elevated BNP on admission and with bilateral lower extremity edema. Echocardiogram showed preserved left ventricular ejection fraction with no regional wall motion abnormalities, has [name] 1 diastolic dysfunction.  Received IV Lasix  with good diuresis but  he became hypotensive with therapy and this was discontinued.    He was in the ED on Jan 17 with complaints of increased  SOB difficulty breathing for 24 hours and family concerned he was in Afib. Ecg showed NSR with LBBB. Vitals were OK. Creatinine 1.31. otherwise CBC BMET, BNP and troponins negative. CXR with mild atelectasis.   ED visit 06/19/23 given IV Lasix  for volume overload. BNP  227.1. CXR with retrocardiac atelectasis vs infiltrate.   On follow up today he is doing OK. Trying to stay out of the heat. Did get more SOB last night and daughter gave him extra lasix . No increase in swelling. Weight is good.   EKGs/Labs/Other Studies Reviewed:   The following studies were reviewed today:  Echo 04/2020 1. Left ventricular ejection fraction, by estimation, is 60 to 65%. The  left ventricle has normal function. The left ventricle has no regional  wall motion abnormalities. There is mild left ventricular hypertrophy.  Left ventricular diastolic parameters  are consistent with [name] I diastolic dysfunction (impaired relaxation).   2. Right ventricular systolic function is normal. The right ventricular  size is normal.   3. The mitral valve is normal in structure. Mild mitral valve  regurgitation. No evidence of mitral stenosis.   4. The aortic valve is tricuspid. Aortic valve regurgitation is trivial.  Mild aortic valve sclerosis is present, with no evidence of aortic valve  stenosis.   5. The inferior vena cava is normal in size with greater than 50%  respiratory variability, suggesting right atrial pressure of 3 mmHg.   Event monitor 10/14/21: Study Highlights      Normal sinus rhythm   3 runs of SVT longest lasting 31 seconds.   Junctional rhythm with rate 55-60 bpm. Patient triggered events correlate with this   No Atrial fibrillation     Patch Wear Time:  8 days and 1 hours (2023-07-26T08:56:18-0400 to 2023-08-03T10:09:56-0400)   Patient had a min HR of 29 bpm, max HR of 102 bpm, and avg HR of 63 bpm. Predominant underlying rhythm was Sinus Rhythm. First Degree AV Block was present. Bundle Branch Block/IVCD was present. QRS morphology changes were present throughout recording. 3  Supraventricular Tachycardia runs occurred, the run with the fastest interval lasting 10 beats with a max rate of 102 bpm, the longest lasting 31.4 secs with an avg rate of 96 bpm.  Junctional Rhythm was present. Junctional Rhythm was detected within +/-  45 seconds of symptomatic patient event(s). Isolated SVEs were rare (<1.0%), SVE Couplets were rare (<1.0%), and SVE Triplets were rare (<1.0%). Isolated VEs were rare (<1.0%), and no VE Couplets or VE Triplets were present.  Echo 01/14/22: IMPRESSIONS     1. Left ventricular ejection fraction, by estimation, is 65 to 70%. The  left ventricle has normal function. The left ventricle has no regional  wall motion abnormalities. There is mild left ventricular hypertrophy.  Left ventricular diastolic parameters  are consistent with [name] II diastolic dysfunction (pseudonormalization).  Elevated left atrial pressure.   2. Right ventricular systolic function is normal. The right ventricular  size is normal. Tricuspid regurgitation signal is inadequate for assessing  PA pressure.   3. The mitral valve is normal in structure. No evidence of mitral valve  regurgitation. No evidence of mitral stenosis.   4. The aortic valve was not well visualized. Aortic valve regurgitation  is not visualized. No aortic stenosis is present.    EKG:  done 06/19/23: NSR with LBBB. No acute change. I have personally reviewed and interpreted this study.    Recent Labs: 11/14/2022: Magnesium  2.2 06/19/2023: ALT 32; Hemoglobin 13.5; Platelets 218 06/27/2023: BNP  89.2; BUN 20; Creatinine, Ser 1.16; Potassium 4.2; Sodium 144  Recent Lipid Panel No results found for: CHOL, TRIG, HDL, CHOLHDL, VLDL, LDLCALC, LDLDIRECT Dated 12/07/22: normal CBC and CMET  Risk Assessment/Calculations:   CHA2DS2-VASc Score = 5   This indicates a 7.2% annual risk of stroke. The patient's score is based upon: CHF History: 1 HTN History: 1 Diabetes History: 0 Stroke History: 0 Vascular Disease History: 1 Age Score: 2 Gender Score: 0      Home Medications   Current Meds  Medication Sig   acetaminophen  (TYLENOL ) 325 MG tablet Take 2 tablets  (650 mg total) by mouth every 6 (six) hours as needed for mild pain, fever or headache.   cholecalciferol (VITAMIN D3) 25 MCG (1000 UNIT) tablet Take 1,000 Units by mouth in the morning and at bedtime.   diclofenac  Sodium (VOLTAREN ) 1 % GEL Apply 4 g topically 4 (four) times daily.   donepezil  (ARICEPT ) 10 MG tablet Take 10 mg by mouth at bedtime.   doxycycline  (VIBRAMYCIN ) 100 MG capsule Take 100 mg by mouth 2 (two) times daily.   ELIQUIS  5 MG TABS tablet TAKE 1 TABLET(5 MG) BY MOUTH TWICE DAILY   fesoterodine  (TOVIAZ ) 8 MG TB24 tablet Take 8 mg by mouth daily.   ketoconazole (NIZORAL) 2 % cream Apply 1 application topically daily.   metroNIDAZOLE  (METROGEL ) 1 % gel Apply 1 Application topically daily.   mirabegron  ER (MYRBETRIQ ) 50 MG TB24 tablet Take 50 mg by mouth every morning.   Misc Natural Products (OSTEO BI-FLEX/5-LOXIN ADVANCED) TABS Take 1 tablet by mouth 2 (two) times daily.   Multiple Vitamin (MULTIVITAMIN WITH MINERALS) TABS tablet Take 1 tablet by mouth every morning.   Omega-3 Fatty Acids  (FISH OIL ) 1200 MG CAPS Take 1,200 mg by mouth 2 (two) times daily.   PRESCRIPTION MEDICATION Inhale into the lungs at bedtime. CPAP   terbinafine (LAMISIL) 1 % cream Apply 1 Application topically daily.   triamcinolone  cream (KENALOG ) 0.1 % Apply 1 Application topically 2 (two) times daily.   [DISCONTINUED] furosemide  (LASIX ) 20 MG tablet Take one tablet daily. You may take an extra tablet for swelling, weight gain of 2 pounds overnight or 5 pounds in one week.   [DISCONTINUED] rosuvastatin  (CRESTOR ) 10 MG tablet TAKE 1/2 TABLET BY MOUTH EVERY OTHER DAY     Review of Systems      All other systems reviewed and are otherwise negative except as noted above.  Physical Exam    VS:  BP (!) 102/44   Pulse 68   Ht 5' 9 (1.753 m)   Wt 198 lb 9.6 oz (90.1 kg)   SpO2 98%   BMI 29.33 kg/m  , BMI Body mass index is 29.33 kg/m.  Wt Readings from Last 3 Encounters:  09/22/23 198 lb 9.6 oz  (90.1 kg)  06/26/23 202 lb 12.8 oz (92 kg)  03/21/23 268 lb (121.6 kg)    GEN: Well nourished, overweight, well developed, in no acute distress. HEENT: normal. Neck: Supple, no JVD, carotid bruits, or masses. Cardiac: RRR, no murmurs, rubs, or gallops. No clubbing, cyanosis, edema.  Radials/PT 2+ and equal bilaterally.  Respiratory:  Respirations regular and unlabored, clear to auscultation bilaterally. GI: Soft, nontender, nondistended. MS: No deformity or atrophy. Skin: Warm and dry, no rash. Neuro:  Strength and sensation are intact. Psych: Normal affect.  Assessment & Plan    Atrial fibrillation / Hypercoagulable state  Had Afib during hospitalization in November 2022 when septic. No documented recurrence since  then. In fact event monitor in August 2023 showed no afib. NSR on ED visit in April. Remains on Eliquis . Beta blocker discontinued due bradycardia.   Chronic diastolic heart failure - Weight is stable compared to last clinic visit. No edema, orthopnea, PND. Continue Lasix  20mg  QD.  Low sodium diet, fluid restriction <2L, and daily weights encouraged. Can take extra lasix  PRN  Chronic SOB -  multifactorial. Does not appear to be significantly volume overloaded. Symptoms did not change with diuresis.   HTN - BP well controlled.      Disposition: Follow up  6 months   Signed, Sinthia Karabin [place], MD 09/22/2023, 8:53 AM [facility]

## 2023-09-22 ENCOUNTER — Encounter: Payer: Self-pay | Admitting: Cardiology

## 2023-09-22 ENCOUNTER — Ambulatory Visit: Attending: Cardiology | Admitting: Cardiology

## 2023-09-22 VITALS — BP 102/44 | HR 68 | Ht 69.0 in | Wt 198.6 lb

## 2023-09-22 DIAGNOSIS — Z7901 Long term (current) use of anticoagulants: Secondary | ICD-10-CM | POA: Insufficient documentation

## 2023-09-22 DIAGNOSIS — I5032 Chronic diastolic (congestive) heart failure: Secondary | ICD-10-CM | POA: Diagnosis present

## 2023-09-22 DIAGNOSIS — I447 Left bundle-branch block, unspecified: Secondary | ICD-10-CM | POA: Diagnosis present

## 2023-09-22 DIAGNOSIS — I25118 Atherosclerotic heart disease of native coronary artery with other forms of angina pectoris: Secondary | ICD-10-CM | POA: Diagnosis present

## 2023-09-22 DIAGNOSIS — I10 Essential (primary) hypertension: Secondary | ICD-10-CM | POA: Diagnosis present

## 2023-09-22 DIAGNOSIS — I480 Paroxysmal atrial fibrillation: Secondary | ICD-10-CM | POA: Insufficient documentation

## 2023-09-22 MED ORDER — FUROSEMIDE 20 MG PO TABS: ORAL_TABLET | ORAL | Status: AC

## 2023-09-22 MED ORDER — ROSUVASTATIN CALCIUM 10 MG PO TABS: 5.0000 mg | ORAL_TABLET | ORAL | 1 refills | Status: AC

## 2023-09-22 NOTE — Patient Instructions (Signed)

## 2023-10-27 ENCOUNTER — Ambulatory Visit: Admitting: Podiatry

## 2023-10-27 DIAGNOSIS — M79675 Pain in left toe(s): Secondary | ICD-10-CM

## 2023-10-27 DIAGNOSIS — M79674 Pain in right toe(s): Secondary | ICD-10-CM | POA: Diagnosis not present

## 2023-10-27 DIAGNOSIS — Z7901 Long term (current) use of anticoagulants: Secondary | ICD-10-CM | POA: Diagnosis not present

## 2023-10-27 DIAGNOSIS — B351 Tinea unguium: Secondary | ICD-10-CM

## 2023-10-27 DIAGNOSIS — Q828 Other specified congenital malformations of skin: Secondary | ICD-10-CM

## 2023-10-27 NOTE — Progress Notes (Signed)
  Subjective:  Patient ID: [name] [name], male    DOB: [birth year],   MRN: [id number]  Chief Complaint  Patient presents with   Nail Problem    Nail trim  Pts daughter stated that she would like him to come every 3 months instead of 4     83 y.o. male presents for concern of thickened elongated and painful nails that are difficult to trim. Requesting to have them trimmed today. Cocnerned about left great toe crack as well as right pinky toenail.   Does relates some burning sensation in feet that come and go.  He is not diabetic but is currently on Eliquis .   PCP:  Wallie Drafts, NP    . Denies any other pedal complaints. Denies n/v/f/c.   Past Medical History:  Diagnosis Date   Alzheimer disease (HCC)    Chronic kidney disease    mild insuffiency   Coronary artery disease    LHC 4/10: Mid LAD 40-50%, then 70%, OM1 20-30%, EF 65%. Mid LAD FFR 0.89 (not hemodynamically significant). Medical therapy was continued.   Dysuria 08/10/2020   Hyperlipidemia    LBBB (left bundle branch block) 06/2018   Obstructive sleep apnea     Objective:  Physical Exam: Vascular: DP/PT pulses 2/4 bilateral. CFT <3 seconds. Absent hair growth on digits. Edema noted to bilateral lower extremities. Xerosis noted bilaterally.  Skin. No lacerations or abrasions bilateral feet. Nails 1-5 bilateral  are thickened discolored and elongated with subungual debris.  Mild erythema noted over dorsum of fifth digit appear to be rubbing from shoes. Hyperkeratotic tissue noted bilateral plantar fifth metatarsals with cored areas noted.  Musculoskeletal: MMT 5/5 bilateral lower extremities in DF, PF, Inversion and Eversion. Deceased ROM in DF of ankle joint. Hammered digits 2-5 bilateral.  Neurological: Sensation intact to light touch. Protective sensation intact bilateral.    Assessment:   1. Pain due to onychomycosis of toenails of both feet   2. Porokeratosis   3. Chronic anticoagulation         Plan:   Patient was evaluated and treated and all questions answered. -Discussed and educated patient on foot care, especially with  regards to the vascular, neurological and musculoskeletal systems.  -Discussed supportive shoes at all times and checking feet regularly.  -Mechanically debrided all nails 1-5 bilateral using sterile nail nipper and filed with dremel without incident  -Hyperkeratotic tissue x2 debrided bilateral fifth metatarsal heads without incident with chisel.  -Answered all patient questions -Patient to return  in 3 months for at risk foot care -Patient advised to call the office if any problems or questions arise in the meantime.   Asberry Failing, DPM

## 2023-11-07 ENCOUNTER — Emergency Department (HOSPITAL_BASED_OUTPATIENT_CLINIC_OR_DEPARTMENT_OTHER)
Admission: EM | Admit: 2023-11-07 | Discharge: 2023-11-07 | Disposition: A | Discharge status: Home / Self Care | Attending: Emergency Medicine | Admitting: Emergency Medicine

## 2023-11-07 ENCOUNTER — Other Ambulatory Visit: Payer: Self-pay

## 2023-11-07 ENCOUNTER — Encounter (HOSPITAL_BASED_OUTPATIENT_CLINIC_OR_DEPARTMENT_OTHER): Payer: Self-pay | Admitting: Emergency Medicine

## 2023-11-07 ENCOUNTER — Emergency Department (HOSPITAL_BASED_OUTPATIENT_CLINIC_OR_DEPARTMENT_OTHER)

## 2023-11-07 DIAGNOSIS — R109 Unspecified abdominal pain: Secondary | ICD-10-CM

## 2023-11-07 DIAGNOSIS — F0390 Unspecified dementia without behavioral disturbance: Secondary | ICD-10-CM | POA: Insufficient documentation

## 2023-11-07 DIAGNOSIS — R1031 Right lower quadrant pain: Secondary | ICD-10-CM | POA: Insufficient documentation

## 2023-11-07 DIAGNOSIS — Z7901 Long term (current) use of anticoagulants: Secondary | ICD-10-CM | POA: Diagnosis not present

## 2023-11-07 DIAGNOSIS — R1030 Lower abdominal pain, unspecified: Secondary | ICD-10-CM | POA: Diagnosis present

## 2023-11-07 DIAGNOSIS — R1032 Left lower quadrant pain: Secondary | ICD-10-CM | POA: Diagnosis not present

## 2023-11-07 DIAGNOSIS — I509 Heart failure, unspecified: Secondary | ICD-10-CM | POA: Diagnosis not present

## 2023-11-07 LAB — COMPREHENSIVE METABOLIC PANEL WITH GFR
ALT: 24 U/L (ref 0–44)
AST: 25 U/L (ref 15–41)
Albumin: 4.4 g/dL (ref 3.5–5.0)
Alkaline Phosphatase: 81 U/L (ref 38–126)
Anion gap: 13 (ref 5–15)
BUN: 26 mg/dL — ABNORMAL HIGH (ref 8–23)
CO2: 24 mmol/L (ref 22–32)
Calcium: 11.2 mg/dL — ABNORMAL HIGH (ref 8.9–10.3)
Chloride: 103 mmol/L (ref 98–111)
Creatinine, Ser: 1.32 mg/dL — ABNORMAL HIGH (ref 0.61–1.24)
GFR, Estimated: 53 mL/min — ABNORMAL LOW (ref >60)
Glucose, Bld: 208 mg/dL — ABNORMAL HIGH (ref 70–99)
Potassium: 4.3 mmol/L (ref 3.5–5.1)
Sodium: 141 mmol/L (ref 135–145)
Total Bilirubin: 0.7 mg/dL (ref 0.0–1.2)
Total Protein: 7.3 g/dL (ref 6.5–8.1)

## 2023-11-07 LAB — CBC WITH DIFFERENTIAL/PLATELET
Abs Immature Granulocytes: 0.03 K/uL (ref 0.00–0.07)
Basophils Absolute: 0 K/uL (ref 0.0–0.1)
Basophils Relative: 0 %
Eosinophils Absolute: 0 K/uL (ref 0.0–0.5)
Eosinophils Relative: 0 %
HCT: 43.4 % (ref 39.0–52.0)
Hemoglobin: 14.4 g/dL (ref 13.0–17.0)
Immature Granulocytes: 0 %
Lymphocytes Relative: 7 %
Lymphs Abs: 0.6 K/uL — ABNORMAL LOW (ref 0.7–4.0)
MCH: 31.2 pg (ref 26.0–34.0)
MCHC: 33.2 g/dL (ref 30.0–36.0)
MCV: 94.1 fL (ref 80.0–100.0)
Monocytes Absolute: 0.5 K/uL (ref 0.1–1.0)
Monocytes Relative: 5 %
Neutro Abs: 8 K/uL — ABNORMAL HIGH (ref 1.7–7.7)
Neutrophils Relative %: 88 %
Platelets: 196 K/uL (ref 150–400)
RBC: 4.61 MIL/uL (ref 4.22–5.81)
RDW: 12.8 % (ref 11.5–15.5)
WBC: 9.2 K/uL (ref 4.0–10.5)
nRBC: 0 % (ref 0.0–0.2)

## 2023-11-07 LAB — LIPASE, BLOOD: Lipase: 12 U/L (ref 11–51)

## 2023-11-07 MED ORDER — IOHEXOL 300 MG/ML  SOLN
100.0000 mL | Freq: Once | INTRAMUSCULAR | Status: AC | PRN
  Administered 2023-11-07: 100 mL via INTRAVENOUS

## 2023-11-07 MED ORDER — SODIUM CHLORIDE 0.9 % IV BOLUS
500.0000 mL | Freq: Once | INTRAVENOUS | Status: AC
  Administered 2023-11-07: 500 mL via INTRAVENOUS

## 2023-11-07 NOTE — ED Triage Notes (Signed)
  Patient BIB EMS for abdominal pain and emesis that started around 4 hours ago.  Patient states he thinks he ate too much garlic.  Had one episode of emesis before arrival.  Denies any pain or nausea right now.

## 2023-11-07 NOTE — Discharge Instructions (Signed)
 Continue medications as previously prescribed.  Return to the ER if you develop severe abdominal pain, high fevers, bloody stools, or for other new and concerning symptoms.

## 2023-11-07 NOTE — ED Provider Notes (Signed)
 [place] [facility] AT [facility] Provider Note   CSN: [id number] Arrival date & time: 11/07/23  0535     Patient presents with: Abdominal Pain and Emesis   [name] [name] is a 83 y.o. male.   Patient is an 83 year old male with past medical history of congestive heart failure, dementia, hyperlipidemia.  Patient brought by family for evaluation of abdominal pain.  Patient woke in the night with an upset stomach.  He describes lower abdominal pain, then went to the bathroom to have a bowel movement.  He apparently had a large bowel movement and was incontinent on the bathroom floor.  He feels somewhat better, but continues with some lower abdominal discomfort.  No fevers or chills.  No urinary complaints.  Daughter states that he ate at an [place] 2 nights ago, then ate leftovers yesterday.  She is concerned he may have had too much garlic.       Prior to Admission medications   Medication Sig Start Date End Date Taking? Authorizing Provider  acetaminophen  (TYLENOL ) 325 MG tablet Take 2 tablets (650 mg total) by mouth every 6 (six) hours as needed for mild pain, fever or headache. 11/22/22   Akula, Vijaya, MD  cholecalciferol (VITAMIN D3) 25 MCG (1000 UNIT) tablet Take 1,000 Units by mouth in the morning and at bedtime.    [provider]  diclofenac  Sodium (VOLTAREN ) 1 % GEL Apply 4 g topically 4 (four) times daily. 11/22/22   Akula, Vijaya, MD  donepezil  (ARICEPT ) 10 MG tablet Take 10 mg by mouth at bedtime. 12/01/16   [provider]  doxycycline  (VIBRAMYCIN ) 100 MG capsule Take 100 mg by mouth 2 (two) times daily. 08/13/22   [provider]  ELIQUIS  5 MG TABS tablet TAKE 1 TABLET(5 MG) BY MOUTH TWICE DAILY 06/09/23   [place], Peter M, MD  fesoterodine  (TOVIAZ ) 8 MG TB24 tablet Take 8 mg by mouth daily. 12/13/18   [provider]  furosemide  (LASIX ) 20 MG tablet Take one tablet daily. You may take an extra tablet for swelling,  weight gain of 2 pounds overnight or 5 pounds in one week. 09/22/23   [place], Peter M, MD  ketoconazole (NIZORAL) 2 % cream Apply 1 application topically daily. 02/27/21   [provider]  metroNIDAZOLE  (METROGEL ) 1 % gel Apply 1 Application topically daily.    [provider]  mirabegron  ER (MYRBETRIQ ) 50 MG TB24 tablet Take 50 mg by mouth every morning.    [provider]  Misc Natural Products (OSTEO BI-FLEX/5-LOXIN ADVANCED) TABS Take 1 tablet by mouth 2 (two) times daily.    [provider]  Multiple Vitamin (MULTIVITAMIN WITH MINERALS) TABS tablet Take 1 tablet by mouth every morning.    [provider]  nitrofurantoin , macrocrystal-monohydrate, (MACROBID ) 100 MG capsule Take 1 capsule (100 mg total) by mouth in the morning. Patient taking differently: Take 100 mg by mouth every evening. 01/23/21   Gonfa, Taye T, MD  Omega-3 Fatty Acids  (FISH OIL ) 1200 MG CAPS Take 1,200 mg by mouth 2 (two) times daily.    [provider]  PRESCRIPTION MEDICATION Inhale into the lungs at bedtime. CPAP    [provider]  rosuvastatin  (CRESTOR ) 10 MG tablet Take 0.5 tablets (5 mg total) by mouth every other day. 09/22/23   [place], Peter M, MD  terbinafine (LAMISIL) 1 % cream Apply 1 Application topically daily.    [provider]  triamcinolone  cream (KENALOG ) 0.1 % Apply 1 Application  topically 2 (two) times daily. 10/13/22   [provider]    Allergies: Darifenacin, Enablex [darifenacin hydrobromide er], Atorvastatin, Ceftriaxone, Ezetimibe, Metoprolol , Oxybutynin, Sulfamethoxazole, Sulfasalazine, Levofloxacin, and Sulfonamide derivatives    Review of Systems  All other systems reviewed and are negative.   Updated Vital Signs BP (!) 144/86 (BP Location: Left Arm)   Pulse 64   Temp 98.7 F (37.1 C) (Oral)   Resp 18   Ht 5' 9 (1.753 m)   Wt 89.8 kg   SpO2 97%   BMI 29.24 kg/m   Physical Exam Vitals and nursing note  reviewed.  Constitutional:      General: He is not in acute distress.    Appearance: He is well-developed. He is not diaphoretic.  HENT:     Head: Normocephalic and atraumatic.  Cardiovascular:     Rate and Rhythm: Normal rate and regular rhythm.     Heart sounds: No murmur heard.    No friction rub.  Pulmonary:     Effort: Pulmonary effort is normal. No respiratory distress.     Breath sounds: Normal breath sounds. No wheezing or rales.  Abdominal:     General: Bowel sounds are normal. There is no distension.     Palpations: Abdomen is soft.     Tenderness: There is abdominal tenderness in the right lower quadrant, suprapubic area and left lower quadrant. There is no right CVA tenderness, left CVA tenderness, guarding or rebound.  Musculoskeletal:        General: Normal range of motion.     Cervical back: Normal range of motion and neck supple.  Skin:    General: Skin is warm and dry.  Neurological:     Mental Status: He is alert and oriented to person, place, and time.     Coordination: Coordination normal.     (all labs ordered are listed, but only abnormal results are displayed) Labs Reviewed  CBC WITH DIFFERENTIAL/PLATELET - Abnormal; Notable for the following components:      Result Value   Neutro Abs 8.0 (*)    Lymphs Abs 0.6 (*)    All other components within normal limits  COMPREHENSIVE METABOLIC PANEL WITH GFR  LIPASE, BLOOD    EKG: None  Radiology: No results found.   Procedures   Medications Ordered in the ED  sodium chloride  0.9 % bolus 500 mL (has no administration in time range)                                    Medical Decision Making Amount and/or Complexity of Data Reviewed Labs: ordered. Radiology: ordered.  Risk Prescription drug management.   Patient is an 83 year old male presenting with complaints of lower abdominal discomfort and having had a large bowel movement just prior to arrival.  Patient arrives here with stable vital  signs and is afebrile.  Physical examination reveals mild tenderness across the lower abdomen, but no peritoneal signs.  Laboratory studies obtained including CBC, CMP, and lipase, all of which are unremarkable.  CT scan of the abdomen and pelvis showing mild wall thickening of the descending colon and proximal sigmoid consistent with possible colitis.  Upon reevaluation, the patient's abdomen remains benign and I feel can safely be discharged.  He reports having a very large bowel movement and perhaps he had some sort of stercoral colitis relating to this.  To return as needed for any problems.  Final diagnoses:  None    ED Discharge Orders     None          Geroldine Berg, MD 11/07/23 [phone]

## 2023-11-07 NOTE — ED Notes (Signed)

## 2023-11-24 ENCOUNTER — Ambulatory Visit: Admitting: Podiatry

## 2023-12-21 ENCOUNTER — Telehealth: Payer: Self-pay

## 2023-12-21 NOTE — Telephone Encounter (Signed)
 CMN received for CPAP supplies signed by Dr. Neysa and faxed back to [facility] [phone]

## 2023-12-23 ENCOUNTER — Other Ambulatory Visit: Payer: Self-pay | Admitting: Cardiology

## 2023-12-23 DIAGNOSIS — I480 Paroxysmal atrial fibrillation: Secondary | ICD-10-CM

## 2023-12-29 ENCOUNTER — Ambulatory Visit: Admitting: Cardiology

## 2024-01-01 ENCOUNTER — Encounter: Payer: Self-pay | Admitting: Radiology

## 2024-01-02 ENCOUNTER — Telehealth: Payer: Self-pay

## 2024-01-02 NOTE — Telephone Encounter (Signed)
 Pt is scheduled to see Dr Neysa on Thursday, 01-04-24. I could not find pt in airview.   ATC pt x1. LMTCB  I called and spoke to [facility] with [employer]. Arvella states an order was back in 05-24-21 and [name], pt's daughter, told [employer] to cancel the order. Arvella states pt was never sent anything from them.

## 2024-01-03 NOTE — Progress Notes (Signed)
 HPI male former smoker followed for OSA, complicated by CAD, CHF, venous stasis dermatitis, chronic renal disease PFT: 12/16/2011-within normal limits. Small airway flows were quite normal but improved with bronchodilator. FEV1 3.03/107%, FEV1/FVC 0.82, DLCO 0.89. NPSG 12/25/91- AHI 19.3/ hr, desat to 66%, body weight 206 lbs PFT11/2/23- normal spirometry w/o resp to BD, Diffusion mildly reduced. ---------------------------------------------------------------------------------------------   01/03/23- 83 year old male former smoker followed for OSA, complicated by CAD/ CHF, chronic renal disease, Dementia/ Alzheimers, GERD with esophagitis, Venous stasis dermatititis, Covid infection [id number],  PAFib,  CPAP auto 5-15/[employer] ([place])Replaced 07/16/21  Download compliance- Body weight today- Hosp Sept with cellulitis, R lower leg CPAP replacement has to wait until 1st of year- insurance. He is still using daughter's very old machine without download. Says he uses it every night and sleeps ok. Some dyspnea on exertion- recognizes mostly cardiac. Paces himself. Little wheeze or cough. CXR 11/18/22- 1V- IMPRESSION: No active disease.  01/04/24- 83 year old male former smoker followed for OSA, complicated by CAD/ CHF, chronic renal disease, Dementia/ Alzheimers, GERD with esophagitis, Venous stasis dermatititis, Covid infection [id number],  PAFib,  CPAP auto 5-15/[facility] ([place])Replaced Feb, 2025                 Daughter [name] here- primary care-taker. Download compliance- Body weight today-196 lbs Discussed the use of AI scribe software for clinical note transcription with the patient, who gave verbal consent to proceed.  History of Present Illness   [name] [name] is an 83 year old male with sleep apnea who presents with sleep problems.  He experiences issues with the fit of his CPAP mask, which occasionally affects his sleep quality. He received a new CPAP machine on March 15, 2023,  after the previous one broke down, and temporarily used his daughter's old machine. He experiences some shortness of breath during the day and has a history of smoking. Little cough or wheeze, no chest pain and no acute changes. Download from phone shows 99% compliance with goals, residual AHI 3.6/hr.     Assessment and Plan:    Obstructive sleep apnea Managed with CPAP therapy. Reports difficulty with mask fit but generally tolerates it well. CPAP machine replaced in January 2025. - Contact [facility] for mask refit assistance. - Ensure follow-up with a sleep specialist if issues arise.  Chronic shortness of breath Likely multifactorial, possibly related to cardiac issues and past smoking history. No acute changes or significant concerns. - Continue to monitor for any significant changes in symptoms. - Contact healthcare provider if symptoms worsen.     ROS-see HPI  + = positive Constitutional:   No-   weight loss, night sweats, fevers, chills, fatigue, lassitude. HEENT:   No-  headaches, difficulty swallowing, tooth/dental problems, sore throat,       No-  sneezing, itching, ear ache, nasal congestion, post nasal drip,  CV:  No-   chest pain, orthopnea, PND, swelling in lower extremities, anasarca,                                           dizziness, palpitations Resp: + shortness of breath with exertion or at rest.              No-   productive cough,  No non-productive cough,  No- coughing up of blood.              No-   change  in color of mucus.  No- wheezing.   Skin: No-   rash or lesions. GI:   GU:  MS:  No-   joint pain or swelling.   Neuro-     Memory decline- Alzheimers Psych:  No- change in mood or affect. No depression or anxiety.  + memory loss.  OBJ General- Alert, Oriented, Affect-appropriate, Distress- none acute. +Obese Skin- rash-none, lesions- none, excoriation- none Lymphadenopathy- none Head- atraumatic            Eyes- Gross vision intact, PERRLA, +Strabismus,  conjunctivae clear secretions            Ears- +Hearing aids, + scarring both TMs            Nose- Clear, no-Septal dev, mucus, polyps, erosion, perforation             Throat- Mallampati IV , mucosa clear , drainage- none, tonsils- atrophic Neck- flexible , trachea midline, no stridor , thyroid  nl, carotid no bruit Chest - symmetrical excursion , unlabored           Heart/CV- RRR-slow ,  Murmur+trace S , no gallop  , no rub, nl s1 s2                           - JVD- none , edema- none, stasis changes- none, varices- none           Lung- clear to P&A, wheeze- none, cough- none , dullness-none, rub- none           Chest wall-  Abd-  Br/ Gen/ Rectal- Not done, not indicated Extrem- + elastic hose, cane,  Neuro- grossly intact to observation

## 2024-01-04 ENCOUNTER — Encounter: Payer: Self-pay | Admitting: Internal Medicine

## 2024-01-04 ENCOUNTER — Ambulatory Visit: Admitting: Internal Medicine

## 2024-01-04 ENCOUNTER — Ambulatory Visit: Payer: Medicare Other | Admitting: Internal Medicine

## 2024-01-04 ENCOUNTER — Telehealth: Payer: Self-pay | Admitting: Cardiology

## 2024-01-04 VITALS — BP 120/73 | HR 46 | Temp 97.7°F | Ht 70.0 in | Wt 196.0 lb

## 2024-01-04 DIAGNOSIS — Z87891 Personal history of nicotine dependence: Secondary | ICD-10-CM

## 2024-01-04 DIAGNOSIS — G4733 Obstructive sleep apnea (adult) (pediatric): Secondary | ICD-10-CM

## 2024-01-04 DIAGNOSIS — R0602 Shortness of breath: Secondary | ICD-10-CM

## 2024-01-04 NOTE — Patient Instructions (Addendum)
 Glad you are doing well- we can continue CPAP at auto 5-15, mask of choice, supplies, humidifier as needed  Please call if we can help

## 2024-01-04 NOTE — Telephone Encounter (Signed)
 STAT if HR is under 50 or over 120 (normal HR is 60-100 beats per minute)  What is your heart rate? 45,48  Do you have a log of your heart rate readings (document readings)? no  Do you have any other symptoms? He is cold all the time

## 2024-01-04 NOTE — Telephone Encounter (Signed)
 Pt daughter updated with plan for patient and that his Cardiologist is aware.

## 2024-01-04 NOTE — Telephone Encounter (Signed)
 Call sent straight to triage. Patient's daughter, DPR, is concerned about her dad's low HR. Patient heart was 46 this morning while seeing Dr. Neysa. Patient's daughter stated patient's HR is still low in the 40's and patient is cold. She was wondering if this has anything to do with his eliquis . Informed her that patient does not appear to be on any medication that would lower his heart rate like a Beta Blocker. Made patient an appointment to see APP tomorrow to get evaluated. Will forward to Dr. Jordan for further advisement.

## 2024-01-05 ENCOUNTER — Ambulatory Visit: Attending: General Practice | Admitting: General Practice

## 2024-01-05 ENCOUNTER — Encounter: Payer: Self-pay | Admitting: General Practice

## 2024-01-05 VITALS — BP 120/72 | HR 73 | Ht 70.0 in | Wt 196.0 lb

## 2024-01-05 DIAGNOSIS — E785 Hyperlipidemia, unspecified: Secondary | ICD-10-CM | POA: Insufficient documentation

## 2024-01-05 DIAGNOSIS — I5032 Chronic diastolic (congestive) heart failure: Secondary | ICD-10-CM | POA: Insufficient documentation

## 2024-01-05 DIAGNOSIS — I10 Essential (primary) hypertension: Secondary | ICD-10-CM | POA: Insufficient documentation

## 2024-01-05 DIAGNOSIS — I480 Paroxysmal atrial fibrillation: Secondary | ICD-10-CM | POA: Insufficient documentation

## 2024-01-05 DIAGNOSIS — I25118 Atherosclerotic heart disease of native coronary artery with other forms of angina pectoris: Secondary | ICD-10-CM | POA: Diagnosis present

## 2024-01-05 DIAGNOSIS — R0609 Other forms of dyspnea: Secondary | ICD-10-CM | POA: Insufficient documentation

## 2024-01-05 LAB — CBC

## 2024-01-05 NOTE — Progress Notes (Signed)
 Cardiology Clinic Note   Patient Name: [name] [name] Date of Encounter: 01/05/2024  Primary Care Provider:  McClanahan, Kyra, NP Primary Cardiologist:  [name] Jordan, MD  Patient Profile    [name] [name] 83 year old male presents to the clinic today for evaluation of his low heart rate.  Past Medical History    Past Medical History:  Diagnosis Date   Alzheimer disease (HCC)    Chronic kidney disease    mild insuffiency   Coronary artery disease    LHC 4/10: Mid LAD 40-50%, then 70%, OM1 20-30%, EF 65%. Mid LAD FFR 0.89 (not hemodynamically significant). Medical therapy was continued.   Dysuria 08/10/2020   Hyperlipidemia    LBBB (left bundle branch block) 06/2018   Obstructive sleep apnea    Past Surgical History:  Procedure Laterality Date   CARDIAC CATHETERIZATION  05/02/2003   single vessel,moderate stenosis mid LAD   CARDIAC CATHETERIZATION  06/12/2008   continue med. therapy   right shoulder surgery     TOTAL HIP ARTHROPLASTY Left 09/14/2018   Procedure: LEFT TOTAL HIP ARTHROPLASTY ANTERIOR APPROACH;  Surgeon: [name] Lonni GRADE, MD;  Location: [facility];  Service: Orthopedics;  Laterality: Left;    Allergies  Allergies  Allergen Reactions   Darifenacin Other (See Comments) and Anaphylaxis    PT STATES IT MAKES HIM BONKERS AND VERY TIRED  Other Reaction(s): Confusion, Confusion/Altered Mental Status  PT STATES IT MAKES HIM BONKERS AND VERY TIRED, Other reaction(s): Mental Status Changes (intolerance), PT STATES IT MAKES HIM BONKERS AND VERY TIRED, PT STATES IT MAKES HIM BONKERS AND VERY TIRED   Enablex [Darifenacin Hydrobromide Er]     PT STATES IT MAKES HIM BONKERS AND VERY TIRED   Atorvastatin Other (See Comments)    Muscle aches/weakness  Other reaction(s): Other (See Comments)  Muscle aches/weakness  Muscle aches/weakness  Muscle aches/weakness  Muscle aches/weakness  Muscle aches/weakness  Muscle aches/weakness  Other reaction(s):  Other (See Comments), Muscle aches/weakness, Muscle aches/weakness, Muscle aches/weakness, Muscle aches/weakness  Muscle aches/weakness    Other reaction(s): Other (See Comments) Muscle aches/weakness Muscle aches/weakness Muscle aches/weakness Muscle aches/weakness Muscle aches/weakness Muscle aches/weakness    Other reaction(s): Other (See Comments), Muscle aches/weakness, Muscle aches/weakness, Muscle aches/weakness, Muscle aches/weakness    Muscle aches/weakness  Other reaction(s): Other (See Comments) Muscle aches/weakness Muscle aches/weakness Muscle aches/weakness Muscle aches/weakness Muscle aches/weakness Muscle aches/weakness   Ceftriaxone Other (See Comments)    Unknown reaction  Other reaction(s): Other (See Comments)  Unknown reaction  Unknown reaction    Unknown reaction  Unknown reaction  Unknown reaction  Other reaction(s): Other (See Comments), Unknown reaction, Unknown reaction, , Unknown reaction  Unknown reaction    Other reaction(s): Other (See Comments) Unknown reaction Unknown reaction  Unknown reaction Unknown reaction Unknown reaction    Other reaction(s): Other (See Comments), Unknown reaction, Unknown reaction, , Unknown reaction    Unknown reaction  Other reaction(s): Other (See Comments) Unknown reaction Unknown reaction  Unknown reaction Unknown reaction Unknown reaction   Ezetimibe Other (See Comments)    Muscle aches/weakness  Other reaction(s): Other (See Comments)  Muscle aches/weakness  Muscle aches/weakness  Muscle aches/weakness  Other reaction(s): Other (See Comments), Muscle aches/weakness, Muscle aches/weakness  Other reaction(s): Other (See Comments) Muscle aches/weakness Muscle aches/weakness Muscle aches/weakness    Other reaction(s): Other (See Comments), Muscle aches/weakness, Muscle aches/weakness    Muscle aches/weakness  Other reaction(s): Other (See Comments) Muscle aches/weakness Muscle aches/weakness Muscle aches/weakness    Metoprolol  Rash and Dermatitis   Oxybutynin  Other (See Comments)    Memory impairment, temperament changes  Other Reaction(s): Confusion  Other reaction(s): Mental Status Changes (intolerance), Memory impairment, temperament changes, Memory impairment, temperament changes, Memory impairment, temperament changes, , Memory impairment, temperament changes   Sulfamethoxazole Other (See Comments)    Childhood allergy  Other reaction(s): Other (See Comments), Childhood allergy, Childhood allergy   Sulfasalazine Other (See Comments)    Childhood allergy  Other reaction(s): Other (See Comments), Unknown per daughter, Unknown per daughter   Levofloxacin Diarrhea and Other (See Comments)    Other Reaction(s): Mental Status Changes  Caused diarheer a   Sulfonamide Derivatives Other (See Comments)    Childhood allergy    History of Present Illness    [name] [name] has a PMH of chronic diastolic CHF, HTN, LBBB, paroxysmal atrial fibrillation, on anticoagulation, and coronary artery disease.  He underwent LHC in 2010 and was noted to have nonobstructive CAD.  FFR on LAD was negative.  He had Myoview  5/20 which was low risk.  He was admitted 11/22 with sepsis due to a neck infection.  His agitation and infection resolved with Decadron  and antibiotics.  His EKG showed atrial fibrillation.  He was rate controlled at rest but with activity heart rate increased to 140s.  Low-dose metoprolol  and anticoagulation were discussed but not initiated.  Eliquis  was subsequently started in the outpatient setting.  He continue to follow-up with cardiology regularly.  He was seen in the emergency department 12/23 with left lower extremity cellulitis.  He was placed on doxycycline .  He also had recurrent pericarditis.  He stated his symptoms persisted and he went back to the emergency department 1/24.  His doxycycline  was transition to clindamycin .  ED noted him to have marked bradycardia with heart rate down to  the high 30s with sinus bradycardia seen on EKG and rate of 41.  After treatment his BP and heart rate were okay at home.  His cellulitis improved.  He was admitted 9/24 with lower extremity cellulitis he had mild acute on chronic diastolic CHF with elevated BNP on admission and bilateral lower extremity edema.  His echocardiogram showed preserved LV function and no regional wall motion abnormalities, G1 DD.  He received diuresis and became hypotensive with therapy.  This was discontinued.  He was seen in the ED  06/19/23 and received IV furosemide  for fluid volume overload.  His BNP was 227.1.  Chest x-ray showed retrocardiac atelectasis versus infiltrates.  He was seen in follow-up by Dr. Jordan on 09/22/2023.  During that time he was doing okay he was trying to stay out of the hot weather.  He did note some shortness of breath the prior night.  His daughter had given him an extra Lasix .  He had no increased swelling.  His weight was stable.  His daughter contacted the clinic on 01/04/2024.  She reported that his heart rate was in the 40s.  She also noted that he was cold.  She asked for a cardiology appointment and if there was anything that could be done for his low heart rate.  He presents to the clinic today for evaluation and his daughter states they were at the pulmonologist yesterday and he had a low heart rate.  She also notes that he has been reporting feeling cold.  His EKG today shows sinus rhythm with first-degree AV block left bundle branch block 73 bpm.  His blood pressure is 120/72.  He is asymptomatic.  When asked about the episode yesterday they report  that he did not feel any different.  On review his blood pressure was 120/73.  He continues to be physically active walking 1 mile to 1-1/2 miles per day.  His breathing is unchanged.  We reviewed his Eliquis  dose.  This is appropriate for his age weight and renal function.  I will order CBC and BMP.  I have asked them to continue to monitor  his blood pressure and pulse.  We will plan follow-up in 4 to 6 months.      Home Medications    Prior to Admission medications   Medication Sig Start Date End Date Taking? Authorizing Provider  acetaminophen  (TYLENOL ) 325 MG tablet Take 2 tablets (650 mg total) by mouth every 6 (six) hours as needed for mild pain, fever or headache. 11/22/22   Cherlyn Labella, MD  apixaban  (ELIQUIS ) 5 MG TABS tablet TAKE 1 TABLET(5 MG) BY MOUTH TWICE DAILY 12/25/23   [name], [name] M, MD  cholecalciferol (VITAMIN D3) 25 MCG (1000 UNIT) tablet Take 1,000 Units by mouth in the morning and at bedtime.    [provider]  diclofenac  Sodium (VOLTAREN ) 1 % GEL Apply 4 g topically 4 (four) times daily. 11/22/22   Akula, Vijaya, MD  donepezil  (ARICEPT ) 10 MG tablet Take 10 mg by mouth at bedtime. 12/01/16   [provider]  doxycycline  (VIBRAMYCIN ) 100 MG capsule Take 100 mg by mouth 2 (two) times daily. 08/13/22   [provider]  fesoterodine  (TOVIAZ ) 8 MG TB24 tablet Take 8 mg by mouth daily. 12/13/18   [provider]  furosemide  (LASIX ) 20 MG tablet Take one tablet daily. You may take an extra tablet for swelling, weight gain of 2 pounds overnight or 5 pounds in one week. 09/22/23   [name], [name] M, MD  ketoconazole (NIZORAL) 2 % cream Apply 1 application topically daily. 02/27/21   [provider]  metroNIDAZOLE  (METROGEL ) 1 % gel Apply 1 Application topically daily.    [provider]  mirabegron  ER (MYRBETRIQ ) 50 MG TB24 tablet Take 50 mg by mouth every morning.    [provider]  Misc Natural Products (OSTEO BI-FLEX/5-LOXIN ADVANCED) TABS Take 1 tablet by mouth 2 (two) times daily.    [provider]  Multiple Vitamin (MULTIVITAMIN WITH MINERALS) TABS tablet Take 1 tablet by mouth every morning.    [provider]  nitrofurantoin , macrocrystal-monohydrate, (MACROBID ) 100 MG capsule Take 1 capsule (100 mg total) by mouth in the  morning. Patient taking differently: Take 100 mg by mouth every evening. 01/23/21   Gonfa, Taye T, MD  Omega-3 Fatty Acids  (FISH OIL ) 1200 MG CAPS Take 1,200 mg by mouth 2 (two) times daily.    [provider]  PRESCRIPTION MEDICATION Inhale into the lungs at bedtime. CPAP    [provider]  rosuvastatin  (CRESTOR ) 10 MG tablet Take 0.5 tablets (5 mg total) by mouth every other day. 09/22/23   [name], [name] M, MD  terbinafine (LAMISIL) 1 % cream Apply 1 Application topically daily.    [provider]  triamcinolone  cream (KENALOG ) 0.1 % Apply 1 Application topically 2 (two) times daily. 10/13/22   [provider]    Family History    Family History  Problem Relation Age of Onset   Cancer Mother    Hypertension Mother    Other Father        old age   Hypertension Father    He indicated that his mother is deceased. He indicated that his father  is deceased. He indicated that his sister is alive. He indicated that his brother is alive.  Social History    Social History   Socioeconomic History   Marital status: Widowed    Spouse name: Not on file   Number of children: 2   Years of education: Not on file   Highest education level: Not on file  Occupational History   Occupation: [employer]  Tobacco Use   Smoking status: Former    Types: Pipe    Quit date: 02/28/1990    Years since quitting: 33.8   Smokeless tobacco: Never   Tobacco comments:    smoked pipe only   Vaping Use   Vaping status: Never Used  Substance and Sexual Activity   Alcohol use: No    Alcohol/week: 0.0 standard drinks of alcohol    Comment: rarely wine   Drug use: No   Sexual activity: Not Currently    Partners: Female    Comment: widowed  Other Topics Concern   Not on file  Social History Narrative   Not on file   Social Drivers of Health   Financial Resource Strain: Low Risk  (10/11/2023)   Received from [facility]   Overall Financial Resource Strain  (CARDIA)    How hard is it for you to pay for the very basics like food, housing, medical care, and heating?: Not very hard  Food Insecurity: No Food Insecurity (10/11/2023)   Received from [facility]   Hunger Vital Sign    Within the past 12 months, you worried that your food would run out before you got the money to buy more.: Never true    Within the past 12 months, the food you bought just didn't last and you didn't have money to get more.: Never true  Transportation Needs: No Transportation Needs (10/11/2023)   Received from [facility] - Transportation    In the past 12 months, has lack of transportation kept you from medical appointments or from getting medications?: No    In the past 12 months, has lack of transportation kept you from meetings, work, or from getting things needed for daily living?: No  Physical Activity: Inactive (10/11/2023)   Received from [facility]   Exercise Vital Sign    On average, how many days per week do you engage in moderate to strenuous exercise (like a brisk walk)?: 0 days    Minutes of Exercise per Session: Not on file  Stress: No Stress Concern Present (10/11/2023)   Received from [facility] - Occupational Stress Questionnaire    Do you feel stress - tense, restless, nervous, or anxious, or unable to sleep at night because your mind is troubled all the time - these days?: Not at all  Social Connections: Somewhat Isolated (10/11/2023)   Received from [facility]   Social Network    How would you rate your social network (family, work, friends)?: Restricted participation with some degree of social isolation  Intimate Partner Violence: Not At Risk (10/11/2023)   Received from [facility]   HITS    Over the last 12 months how often did your partner physically hurt you?: Never    Over the last 12 months how often did your partner insult you or talk down to you?: Never    Over the last 12  months how often did your partner threaten you with physical harm?: Never    Over  the last 12 months how often did your partner scream or curse at you?: Never     Review of Systems    General:  No chills, fever, night sweats or weight changes.  Cardiovascular:  No chest pain, dyspnea on exertion, edema, orthopnea, palpitations, paroxysmal nocturnal dyspnea. Dermatological: No rash, lesions/masses Respiratory: No cough, dyspnea Urologic: No hematuria, dysuria Abdominal:   No nausea, vomiting, diarrhea, bright red blood per rectum, melena, or hematemesis Neurologic:  No visual changes, wkns, changes in mental status. All other systems reviewed and are otherwise negative except as noted above.  Physical Exam    VS:  BP 120/72   Pulse 73   Ht 5' 10 (1.778 m)   Wt 196 lb (88.9 kg)   SpO2 97%   BMI 28.12 kg/m  , BMI Body mass index is 28.12 kg/m. GEN: Well nourished, well developed, in no acute distress. HEENT: normal. Neck: Supple, no JVD, carotid bruits, or masses. Cardiac: RRR, no murmurs, rubs, or gallops. No clubbing, cyanosis, edema.  Radials/DP/PT 2+ and equal bilaterally.  Respiratory:  Respirations regular and unlabored, clear to auscultation bilaterally. GI: Soft, nontender, nondistended, BS + x 4. MS: no deformity or atrophy. Skin: warm and dry, no rash. Neuro:  Strength and sensation are intact. Psych: Normal affect.  Accessory Clinical Findings    Recent Labs: 06/27/2023: BNP 89.2 11/07/2023: ALT 24; BUN 26; Creatinine, Ser 1.32; Hemoglobin 14.4; Platelets 196; Potassium 4.3; Sodium 141   Recent Lipid Panel No results found for: CHOL, TRIG, HDL, CHOLHDL, VLDL, LDLCALC, LDLDIRECT       ECG personally reviewed by me today-scheduled EKG Interpretation Date/Time:  Friday January 05 2024 08:26:19 EST Ventricular Rate:  73 PR Interval:  224 QRS Duration:  156 QT Interval:  412 QTC Calculation: 453 R Axis:   -27  Text Interpretation: Sinus rhythm  with 1st degree A-V block Left bundle branch block When compared with ECG of 19-Jun-2023 10:58, Previous ECG has undetermined rhythm, needs review QRS axis Shifted left Nonspecific T wave abnormality no longer evident in Inferior leads Confirmed by Emelia Hazy [phone]) on 01/05/2024 8:29:39 AM   Echo 04/2020 1. Left ventricular ejection fraction, by estimation, is 60 to 65%. The  left ventricle has normal function. The left ventricle has no regional  wall motion abnormalities. There is mild left ventricular hypertrophy.  Left ventricular diastolic parameters  are consistent with [name] I diastolic dysfunction (impaired relaxation).   2. Right ventricular systolic function is normal. The right ventricular  size is normal.   3. The mitral valve is normal in structure. Mild mitral valve  regurgitation. No evidence of mitral stenosis.   4. The aortic valve is tricuspid. Aortic valve regurgitation is trivial.  Mild aortic valve sclerosis is present, with no evidence of aortic valve  stenosis.   5. The inferior vena cava is normal in size with greater than 50%  respiratory variability, suggesting right atrial pressure of 3 mmHg.    Event monitor 10/14/21: Study Highlights       Normal sinus rhythm   3 runs of SVT longest lasting 31 seconds.   Junctional rhythm with rate 55-60 bpm. Patient triggered events correlate with this   No Atrial fibrillation     Patch Wear Time:  8 days and 1 hours (2023-07-26T08:56:18-0400 to 2023-08-03T10:09:56-0400)   Patient had a min HR of 29 bpm, max HR of 102 bpm, and avg HR of 63 bpm. Predominant underlying rhythm was Sinus Rhythm. First Degree AV Block was present.  Bundle Branch Block/IVCD was present. QRS morphology changes were present throughout recording. 3  Supraventricular Tachycardia runs occurred, the run with the fastest interval lasting 10 beats with a max rate of 102 bpm, the longest lasting 31.4 secs with an avg rate of 96 bpm. Junctional Rhythm was  present. Junctional Rhythm was detected within +/-  45 seconds of symptomatic patient event(s). Isolated SVEs were rare (<1.0%), SVE Couplets were rare (<1.0%), and SVE Triplets were rare (<1.0%). Isolated VEs were rare (<1.0%), and no VE Couplets or VE Triplets were present.   Echo 01/14/22: IMPRESSIONS     1. Left ventricular ejection fraction, by estimation, is 65 to 70%. The  left ventricle has normal function. The left ventricle has no regional  wall motion abnormalities. There is mild left ventricular hypertrophy.  Left ventricular diastolic parameters  are consistent with [name] II diastolic dysfunction (pseudonormalization).  Elevated left atrial pressure.   2. Right ventricular systolic function is normal. The right ventricular  size is normal. Tricuspid regurgitation signal is inadequate for assessing  PA pressure.   3. The mitral valve is normal in structure. No evidence of mitral valve  regurgitation. No evidence of mitral stenosis.   4. The aortic valve was not well visualized. Aortic valve regurgitation  is not visualized. No aortic stenosis is present.       Assessment & Plan   1.  Paroxysmal atrial fibrillation-EKG today shows sinus rhythm first-degree AV block 73 bpm.  CHA2DS2-VASc score 5 (CHF, hypertension, vascular disease, age x 2).  His beta-blocker was previously discontinued due to bradycardia.  Daughter contacted cardiology clinic 01/04/2024 and reported that his heart rate was in the mid 40s.  She asked if there was anything that could be done to help with his feeling cold. Supportive therapy recommended Continue Eliquis  Avoid triggers caffeine, chocolate, EtOH, dehydration excetra. Order CBC, bmp  Chronic diastolic CHF-weight today 196 lbs.  No increased DOE or activity intolerance.  Denies lower extremity swelling orthopnea and PND. Heart healthy low-sodium diet Maintain weight log Fluid restriction Continue current medical therapy  Essential  hypertension-BP today 120/73. Maintain blood pressure log Heart healthy low-sodium diet  Hyperlipidemia-compliant with statin therapy. Continue rosuvastatin  High-fiber diet  Chronic shortness of breath-breathing at baseline. Encouraged coughing and deep breathing. Continue furosemide  Heart healthy low-sodium diet Maintain physical activity as tolerated  Disposition: Follow-up with Dr. Jordan or me in 4 -6 months.   Josefa HERO. Alaylah Heatherington NP-C     01/05/2024, 8:30 AM [facility] [address] [place], [place] [address] Office [phone]    Notice: This dictation was prepared with Dragon dictation along with smaller phrase technology. Any transcriptional errors that result from this process are unintentional and may not be corrected upon review.   I spent 14 minutes examining this patient, reviewing medications, and using patient centered shared decision making involving their cardiac care.   I spent  20 minutes reviewing past medical history,  medications, and prior cardiac tests.

## 2024-01-05 NOTE — Patient Instructions (Signed)
 Medication Instructions:  Your physician recommends that you continue on your current medications as directed. Please refer to the Current Medication list given to you today. *If you need a refill on your cardiac medications before your next appointment, please call your pharmacy*  Lab Work: TODAY-BMET & CBC If you have labs (blood work) drawn today and your tests are completely normal, you will receive your results only by: MyChart Message (if you have MyChart) OR A paper copy in the mail If you have any lab test that is abnormal or we need to change your treatment, we will call you to review the results.  Testing/Procedures: NONE ORDERED  Follow-Up: At [facility], you and your health needs are our priority.  As part of our continuing mission to provide you with exceptional heart care, our providers are all part of one team.  This team includes your primary Cardiologist (physician) and Advanced Practice Providers or APPs (Physician Assistants and Nurse Practitioners) who all work together to provide you with the care you need, when you need it.  Your next appointment:   4-6 month(s)  Provider:   Peter Jordan, MD or Josefa Beauvais, NP   We recommend signing up for the patient portal called MyChart.  Sign up information is provided on this After Visit Summary.  MyChart is used to connect with patients for Virtual Visits (Telemedicine).  Patients are able to view lab/test results, encounter notes, upcoming appointments, etc.  Non-urgent messages can be sent to your provider as well.   To learn more about what you can do with MyChart, go to [email or web address].   Other Instructions: If you feel back check your blood pressure and heart rate.  Exercise regularly as told by your doctor. Make sure to weight daily and keep a weight log.   Moderate-intensity exercise is any activity that gets you moving enough to burn at least three times more energy (calories) than if you were  sitting. Examples of moderate exercise include: Walking a mile in 15 minutes. Doing light yard work. Biking at an easy pace. Most people should get at least 150 minutes of moderate-intensity exercise a week to maintain their body weight.  Increase your water intake: Maintain hydration

## 2024-01-06 LAB — BASIC METABOLIC PANEL WITH GFR
BUN/Creatinine Ratio: 17 (ref 10–24)
BUN: 20 mg/dL (ref 8–27)
CO2: 24 mmol/L (ref 20–29)
Calcium: 10.6 mg/dL — ABNORMAL HIGH (ref 8.6–10.2)
Chloride: 104 mmol/L (ref 96–106)
Creatinine, Ser: 1.21 mg/dL (ref 0.76–1.27)
Glucose: 124 mg/dL — ABNORMAL HIGH (ref 70–99)
Potassium: 4.4 mmol/L (ref 3.5–5.2)
Sodium: 143 mmol/L (ref 134–144)
eGFR: 59 mL/min/1.73 — ABNORMAL LOW (ref >59)

## 2024-01-06 LAB — CBC
Hematocrit: 39.6 % (ref 37.5–51.0)
Hemoglobin: 13 g/dL (ref 13.0–17.7)
MCH: 31.7 pg (ref 26.6–33.0)
MCHC: 32.8 g/dL (ref 31.5–35.7)
MCV: 97 fL (ref 79–97)
Platelets: 212 x10E3/uL (ref 150–450)
RBC: 4.1 x10E6/uL — ABNORMAL LOW (ref 4.14–5.80)
RDW: 12.5 % (ref 11.6–15.4)
WBC: 4.6 x10E3/uL (ref 3.4–10.8)

## 2024-01-07 ENCOUNTER — Ambulatory Visit: Payer: Self-pay | Admitting: General Practice

## 2024-01-08 ENCOUNTER — Encounter: Payer: Self-pay | Admitting: Internal Medicine

## 2024-01-19 ENCOUNTER — Ambulatory Visit (INDEPENDENT_AMBULATORY_CARE_PROVIDER_SITE_OTHER): Admitting: Podiatry

## 2024-01-19 ENCOUNTER — Encounter: Payer: Self-pay | Admitting: Podiatry

## 2024-01-19 DIAGNOSIS — Q828 Other specified congenital malformations of skin: Secondary | ICD-10-CM | POA: Diagnosis not present

## 2024-01-19 DIAGNOSIS — B351 Tinea unguium: Secondary | ICD-10-CM | POA: Diagnosis not present

## 2024-01-19 DIAGNOSIS — M79674 Pain in right toe(s): Secondary | ICD-10-CM | POA: Diagnosis not present

## 2024-01-19 DIAGNOSIS — Z7901 Long term (current) use of anticoagulants: Secondary | ICD-10-CM | POA: Diagnosis not present

## 2024-01-19 DIAGNOSIS — M79675 Pain in left toe(s): Secondary | ICD-10-CM | POA: Diagnosis not present

## 2024-01-19 NOTE — Progress Notes (Signed)
  Subjective:  Patient ID: [name] [name], male    DOB: January 30, 1941,   MRN: [id number]  Chief Complaint  Patient presents with   Nail Problem    Toenails   Callouses    I have a callus underneath both of my little toes.    83 y.o. male presents for concern of thickened elongated and painful nails that are difficult to trim. Requesting to have them trimmed today. Cocnerned about left great toe crack as well as right pinky toenail.   Does relates some burning sensation in feet that come and go.  He is not diabetic but is currently on Eliquis .   PCP:  Wallie Drafts, NP    . Denies any other pedal complaints. Denies n/v/f/c.   Past Medical History:  Diagnosis Date   Alzheimer disease (HCC)    Chronic kidney disease    mild insuffiency   Coronary artery disease    LHC 4/10: Mid LAD 40-50%, then 70%, OM1 20-30%, EF 65%. Mid LAD FFR 0.89 (not hemodynamically significant). Medical therapy was continued.   Dysuria 08/10/2020   Hyperlipidemia    LBBB (left bundle branch block) 06/2018   Obstructive sleep apnea     Objective:  Physical Exam: Vascular: DP/PT pulses 2/4 bilateral. CFT <3 seconds. Absent hair growth on digits. Edema noted to bilateral lower extremities. Xerosis noted bilaterally.  Skin. No lacerations or abrasions bilateral feet. Nails 1-5 bilateral  are thickened discolored and elongated with subungual debris.  Mild erythema noted over dorsum of fifth digit appear to be rubbing from shoes. Hyperkeratotic tissue noted bilateral plantar fifth metatarsals with cored areas noted.  Musculoskeletal: MMT 5/5 bilateral lower extremities in DF, PF, Inversion and Eversion. Deceased ROM in DF of ankle joint. Hammered digits 2-5 bilateral.  Neurological: Sensation intact to light touch. Protective sensation intact bilateral.    Assessment:   1. Pain due to onychomycosis of toenails of both feet   2. Chronic anticoagulation   3. Porokeratosis         Plan:  Patient was  evaluated and treated and all questions answered. -Discussed and educated patient on foot care, especially with  regards to the vascular, neurological and musculoskeletal systems.  -Discussed supportive shoes at all times and checking feet regularly.  -Mechanically debrided all nails 1-5 bilateral using sterile nail nipper and filed with dremel without incident  -Hyperkeratotic tissue x2 debrided bilateral fifth metatarsal heads without incident with chisel as courtesy. Discussed prevention techniques to help with cored calluses.  -Answered all patient questions -Patient to return  in 3 months for at risk foot care -Patient advised to call the office if any problems or questions arise in the meantime.   Asberry Failing, DPM

## 2024-01-23 ENCOUNTER — Encounter (HOSPITAL_BASED_OUTPATIENT_CLINIC_OR_DEPARTMENT_OTHER): Payer: Self-pay | Admitting: Emergency Medicine

## 2024-01-23 ENCOUNTER — Emergency Department (HOSPITAL_BASED_OUTPATIENT_CLINIC_OR_DEPARTMENT_OTHER): Admitting: Radiology

## 2024-01-23 ENCOUNTER — Other Ambulatory Visit: Payer: Self-pay

## 2024-01-23 ENCOUNTER — Inpatient Hospital Stay (HOSPITAL_BASED_OUTPATIENT_CLINIC_OR_DEPARTMENT_OTHER)
Admission: EM | Admit: 2024-01-23 | Discharge: 2024-01-24 | DRG: 243 | Disposition: A | Discharge status: Home / Self Care | Attending: Family Medicine | Admitting: Family Medicine

## 2024-01-23 DIAGNOSIS — Z888 Allergy status to other drugs, medicaments and biological substances status: Secondary | ICD-10-CM

## 2024-01-23 DIAGNOSIS — I447 Left bundle-branch block, unspecified: Secondary | ICD-10-CM | POA: Diagnosis present

## 2024-01-23 DIAGNOSIS — I495 Sick sinus syndrome: Secondary | ICD-10-CM | POA: Diagnosis not present

## 2024-01-23 DIAGNOSIS — R001 Bradycardia, unspecified: Secondary | ICD-10-CM | POA: Diagnosis present

## 2024-01-23 DIAGNOSIS — I5032 Chronic diastolic (congestive) heart failure: Secondary | ICD-10-CM | POA: Diagnosis present

## 2024-01-23 DIAGNOSIS — Z7901 Long term (current) use of anticoagulants: Secondary | ICD-10-CM

## 2024-01-23 DIAGNOSIS — G309 Alzheimer's disease, unspecified: Secondary | ICD-10-CM | POA: Diagnosis present

## 2024-01-23 DIAGNOSIS — I2510 Atherosclerotic heart disease of native coronary artery without angina pectoris: Secondary | ICD-10-CM | POA: Diagnosis present

## 2024-01-23 DIAGNOSIS — Z8249 Family history of ischemic heart disease and other diseases of the circulatory system: Secondary | ICD-10-CM

## 2024-01-23 DIAGNOSIS — F0280 Dementia in other diseases classified elsewhere without behavioral disturbance: Secondary | ICD-10-CM | POA: Diagnosis present

## 2024-01-23 DIAGNOSIS — Z1152 Encounter for screening for COVID-19: Secondary | ICD-10-CM

## 2024-01-23 DIAGNOSIS — Z87891 Personal history of nicotine dependence: Secondary | ICD-10-CM

## 2024-01-23 DIAGNOSIS — E785 Hyperlipidemia, unspecified: Secondary | ICD-10-CM | POA: Diagnosis present

## 2024-01-23 DIAGNOSIS — N189 Chronic kidney disease, unspecified: Secondary | ICD-10-CM | POA: Diagnosis not present

## 2024-01-23 DIAGNOSIS — I130 Hypertensive heart and chronic kidney disease with heart failure and stage 1 through stage 4 chronic kidney disease, or unspecified chronic kidney disease: Secondary | ICD-10-CM | POA: Diagnosis present

## 2024-01-23 DIAGNOSIS — R7989 Other specified abnormal findings of blood chemistry: Secondary | ICD-10-CM

## 2024-01-23 DIAGNOSIS — Z881 Allergy status to other antibiotic agents status: Secondary | ICD-10-CM

## 2024-01-23 DIAGNOSIS — I480 Paroxysmal atrial fibrillation: Secondary | ICD-10-CM | POA: Diagnosis present

## 2024-01-23 DIAGNOSIS — I4430 Unspecified atrioventricular block: Secondary | ICD-10-CM | POA: Diagnosis present

## 2024-01-23 DIAGNOSIS — Z882 Allergy status to sulfonamides status: Secondary | ICD-10-CM

## 2024-01-23 DIAGNOSIS — Z604 Social exclusion and rejection: Secondary | ICD-10-CM | POA: Diagnosis present

## 2024-01-23 DIAGNOSIS — Z79899 Other long term (current) drug therapy: Secondary | ICD-10-CM

## 2024-01-23 DIAGNOSIS — Z96642 Presence of left artificial hip joint: Secondary | ICD-10-CM | POA: Diagnosis present

## 2024-01-23 DIAGNOSIS — G4733 Obstructive sleep apnea (adult) (pediatric): Secondary | ICD-10-CM | POA: Diagnosis present

## 2024-01-23 LAB — COMPREHENSIVE METABOLIC PANEL WITH GFR
ALT: 24 U/L (ref 0–44)
AST: 24 U/L (ref 15–41)
Albumin: 3.9 g/dL (ref 3.5–5.0)
Alkaline Phosphatase: 73 U/L (ref 38–126)
Anion gap: 9 (ref 5–15)
BUN: 22 mg/dL (ref 8–23)
CO2: 27 mmol/L (ref 22–32)
Calcium: 11.4 mg/dL — ABNORMAL HIGH (ref 8.9–10.3)
Chloride: 106 mmol/L (ref 98–111)
Creatinine, Ser: 1.15 mg/dL (ref 0.61–1.24)
GFR, Estimated: >60 mL/min (ref >60)
Glucose, Bld: 151 mg/dL — ABNORMAL HIGH (ref 70–99)
Potassium: 4.2 mmol/L (ref 3.5–5.1)
Sodium: 141 mmol/L (ref 135–145)
Total Bilirubin: 0.6 mg/dL (ref 0.0–1.2)
Total Protein: 6.6 g/dL (ref 6.5–8.1)

## 2024-01-23 LAB — MAGNESIUM: Magnesium: 2.2 mg/dL (ref 1.7–2.4)

## 2024-01-23 LAB — CBC WITH DIFFERENTIAL/PLATELET
Abs Immature Granulocytes: 0.01 K/uL (ref 0.00–0.07)
Basophils Absolute: 0 K/uL (ref 0.0–0.1)
Basophils Relative: 1 %
Eosinophils Absolute: 0.1 K/uL (ref 0.0–0.5)
Eosinophils Relative: 2 %
HCT: 40.6 % (ref 39.0–52.0)
Hemoglobin: 13.6 g/dL (ref 13.0–17.0)
Immature Granulocytes: 0 %
Lymphocytes Relative: 22 %
Lymphs Abs: 1.2 K/uL (ref 0.7–4.0)
MCH: 31.6 pg (ref 26.0–34.0)
MCHC: 33.5 g/dL (ref 30.0–36.0)
MCV: 94.2 fL (ref 80.0–100.0)
Monocytes Absolute: 0.6 K/uL (ref 0.1–1.0)
Monocytes Relative: 10 %
Neutro Abs: 3.7 K/uL (ref 1.7–7.7)
Neutrophils Relative %: 65 %
Platelets: 199 K/uL (ref 150–400)
RBC: 4.31 MIL/uL (ref 4.22–5.81)
RDW: 13 % (ref 11.5–15.5)
WBC: 5.6 K/uL (ref 4.0–10.5)
nRBC: 0 % (ref 0.0–0.2)

## 2024-01-23 LAB — TROPONIN T, HIGH SENSITIVITY
Troponin T High Sensitivity: 38 ng/L — ABNORMAL HIGH (ref 0–19)
Troponin T High Sensitivity: 35 ng/L — ABNORMAL HIGH (ref 0–19)

## 2024-01-23 LAB — PRO BRAIN NATRIURETIC PEPTIDE: Pro Brain Natriuretic Peptide: 239 pg/mL (ref <300.0)

## 2024-01-23 LAB — RESP PANEL BY RT-PCR (RSV, FLU A&B, COVID)  RVPGX2
Influenza A by PCR: NEGATIVE
Influenza B by PCR: NEGATIVE
Resp Syncytial Virus by PCR: NEGATIVE
SARS Coronavirus 2 by RT PCR: NEGATIVE

## 2024-01-23 LAB — TSH: TSH: 2.433 u[IU]/mL (ref 0.350–4.500)

## 2024-01-23 LAB — HEPARIN LEVEL (UNFRACTIONATED): Heparin Unfractionated: 0.42 [IU]/mL (ref 0.30–0.70)

## 2024-01-23 MED ORDER — SODIUM CHLORIDE 0.9% FLUSH
3.0000 mL | Freq: Two times a day (BID) | INTRAVENOUS | Status: DC
  Administered 2024-01-23: 3 mL via INTRAVENOUS

## 2024-01-23 MED ORDER — ACETAMINOPHEN 325 MG PO TABS
650.0000 mg | ORAL_TABLET | Freq: Four times a day (QID) | ORAL | Status: DC | PRN
Start: 2024-01-23 — End: 2024-01-24

## 2024-01-23 MED ORDER — SENNOSIDES-DOCUSATE SODIUM 8.6-50 MG PO TABS: 1.0000 | ORAL_TABLET | Freq: Every evening | ORAL | Status: DC | PRN

## 2024-01-23 MED ORDER — ROSUVASTATIN CALCIUM 5 MG PO TABS: 5.0000 mg | ORAL_TABLET | ORAL | Status: DC

## 2024-01-23 MED ORDER — DONEPEZIL HCL 10 MG PO TABS
10.0000 mg | ORAL_TABLET | Freq: Every day | ORAL | Status: DC
  Administered 2024-01-23: 10 mg via ORAL
  Filled 2024-01-23: qty 1

## 2024-01-23 MED ORDER — ONDANSETRON HCL 4 MG PO TABS
4.0000 mg | ORAL_TABLET | Freq: Four times a day (QID) | ORAL | Status: DC | PRN
Start: 2024-01-23 — End: 2024-01-24

## 2024-01-23 MED ORDER — FUROSEMIDE 20 MG PO TABS: 20.0000 mg | ORAL_TABLET | Freq: Every day | ORAL | Status: DC

## 2024-01-23 MED ORDER — ONDANSETRON HCL 4 MG/2ML IJ SOLN: 4.0000 mg | Freq: Four times a day (QID) | INTRAMUSCULAR | Status: DC | PRN

## 2024-01-23 MED ORDER — HEPARIN (PORCINE) 25000 UT/250ML-% IV SOLN
1250.0000 [IU]/h | INTRAVENOUS | Status: DC
  Administered 2024-01-23: 1250 [IU]/h via INTRAVENOUS
  Filled 2024-01-23: qty 250

## 2024-01-23 MED ORDER — ACETAMINOPHEN 650 MG RE SUPP
650.0000 mg | Freq: Four times a day (QID) | RECTAL | Status: DC | PRN
Start: 2024-01-23 — End: 2024-01-24

## 2024-01-23 NOTE — ED Triage Notes (Signed)
 Reports SHOB and brady starting this morning. Hx of afib. Denies CP.

## 2024-01-23 NOTE — ED Provider Notes (Signed)
 [place] EMERGENCY DEPARTMENT AT [facility] Provider Note   CSN: [id number] Arrival date & time: 01/23/24  1158     Patient presents with: Shortness of Breath   [name] [name] is a 83 y.o. male.   Patient with history of Alzheimer's, CKD, CAD, A-fib on Eliquis , OSA on CPAP, hyperlipidemia presents today from home with complaints of shortness of breath.  History provided predominantly by daughter at bedside reports that the patient has been complaining of shortness of breath for the last few years.  Does report that it was more significant today and patient therefore requested to come to the emergency department for evaluation. Denies any cough or congestion, no fevers or chills. Does report 1 episode of sharp non-radiating chest pain that occurred approximately 1 hour prior to arrival today and resolved shortly after. No nausea or vomiting. No leg pain or leg swelling, no PND or orthopnea. No recent travel or surgeries. Reports compliance with his Eliquis . Lives at home with his daughter who cares for him, reports he is fairly functional despite his Alzheimer's diagnoses.  He is able to walk, feed, clean, and dress himself and does simple tasks in the house. Reports that sometimes he has to stop to catch his breath but sometimes he does not. Denies any recently worsening of this. Of note, patient has reportedly had a heart rate in the 40s for the past month, was seen by both cardiology and pulmonology for this.  The history is provided by the patient. No language interpreter was used.  Shortness of Breath      Prior to Admission medications   Medication Sig Start Date End Date Taking? Authorizing Provider  acetaminophen  (TYLENOL ) 325 MG tablet Take 2 tablets (650 mg total) by mouth every 6 (six) hours as needed for mild pain, fever or headache. 11/22/22   Cherlyn Labella, MD  apixaban  (ELIQUIS ) 5 MG TABS tablet TAKE 1 TABLET(5 MG) BY MOUTH TWICE DAILY 12/25/23   Jordan, Peter M, MD   cholecalciferol (VITAMIN D3) 25 MCG (1000 UNIT) tablet Take 1,000 Units by mouth in the morning and at bedtime.    [provider]  diclofenac  Sodium (VOLTAREN ) 1 % GEL Apply 4 g topically 4 (four) times daily. 11/22/22   Cherlyn Labella, MD  donepezil  (ARICEPT ) 10 MG tablet Take 10 mg by mouth at bedtime. 12/01/16   [provider]  doxycycline  (VIBRAMYCIN ) 100 MG capsule Take 100 mg by mouth 2 (two) times daily. 08/13/22   [provider]  fesoterodine  (TOVIAZ ) 8 MG TB24 tablet Take 8 mg by mouth daily. 12/13/18   [provider]  furosemide  (LASIX ) 20 MG tablet Take one tablet daily. You may take an extra tablet for swelling, weight gain of 2 pounds overnight or 5 pounds in one week. 09/22/23   Jordan, Peter M, MD  ketoconazole (NIZORAL) 2 % cream Apply 1 application topically daily. 02/27/21   [provider]  metroNIDAZOLE  (METROGEL ) 1 % gel Apply 1 Application topically daily.    [provider]  mirabegron  ER (MYRBETRIQ ) 50 MG TB24 tablet Take 50 mg by mouth every morning.    [provider]  Misc Natural Products (OSTEO BI-FLEX/5-LOXIN ADVANCED) TABS Take 1 tablet by mouth 2 (two) times daily.    [provider]  Multiple Vitamin (MULTIVITAMIN WITH MINERALS) TABS tablet Take 1 tablet by mouth every morning.    [provider]  nitrofurantoin , macrocrystal-monohydrate, (MACROBID ) 100 MG capsule Take 1 capsule (100 mg total) by mouth in  the morning. Patient taking differently: Take 100 mg by mouth every evening. 01/23/21   Gonfa, Taye T, MD  Omega-3 Fatty Acids  (FISH OIL ) 1200 MG CAPS Take 1,200 mg by mouth 2 (two) times daily.    [provider]  PRESCRIPTION MEDICATION Inhale into the lungs at bedtime. CPAP    [provider]  rosuvastatin  (CRESTOR ) 10 MG tablet Take 0.5 tablets (5 mg total) by mouth every other day. 09/22/23   Jordan, Peter M, MD  terbinafine (LAMISIL) 1 % cream Apply 1 Application  topically daily.    [provider]  triamcinolone  cream (KENALOG ) 0.1 % Apply 1 Application topically 2 (two) times daily. 10/13/22   [provider]    Allergies: Darifenacin, Enablex [darifenacin hydrobromide er], Atorvastatin, Ceftriaxone, Ezetimibe, Metoprolol , Oxybutynin, Sulfamethoxazole, Sulfasalazine, Levofloxacin, and Sulfonamide derivatives    Review of Systems  Respiratory:  Positive for shortness of breath.   All other systems reviewed and are negative.   Updated Vital Signs BP 123/61 (BP Location: Right Arm)   Pulse (!) 43   Temp 98.4 F (36.9 C) (Oral)   Resp 20   SpO2 100%   Physical Exam Vitals and nursing note reviewed.  Constitutional:      General: He is not in acute distress.    Appearance: Normal appearance. He is normal weight. He is not ill-appearing, toxic-appearing or diaphoretic.  HENT:     Head: Normocephalic and atraumatic.  Cardiovascular:     Rate and Rhythm: Regular rhythm. Bradycardia present.     Heart sounds: Normal heart sounds.  Pulmonary:     Effort: Pulmonary effort is normal. No respiratory distress.     Breath sounds: Normal breath sounds.  Abdominal:     Palpations: Abdomen is soft.     Tenderness: There is no abdominal tenderness.  Musculoskeletal:        General: Normal range of motion.     Cervical back: Normal range of motion.     Right lower leg: No tenderness. No edema.     Left lower leg: No tenderness. No edema.  Skin:    General: Skin is warm and dry.  Neurological:     General: No focal deficit present.     Mental Status: He is alert.  Psychiatric:        Mood and Affect: Mood normal.        Behavior: Behavior normal.     (all labs ordered are listed, but only abnormal results are displayed) Labs Reviewed  COMPREHENSIVE METABOLIC PANEL WITH GFR - Abnormal; Notable for the following components:      Result Value   Glucose, Bld 151 (*)    Calcium  11.4 (*)    All other components within normal  limits  TROPONIN T, HIGH SENSITIVITY - Abnormal; Notable for the following components:   Troponin T High Sensitivity 38 (*)    All other components within normal limits  TROPONIN T, HIGH SENSITIVITY - Abnormal; Notable for the following components:   Troponin T High Sensitivity 35 (*)    All other components within normal limits  RESP PANEL BY RT-PCR (RSV, FLU A&B, COVID)  RVPGX2  CBC WITH DIFFERENTIAL/PLATELET  PRO BRAIN NATRIURETIC PEPTIDE    EKG: None  Radiology: DG Chest 2 View Result Date: 01/23/2024 CLINICAL DATA:  shortness of breath EXAM: CHEST - 2 VIEW COMPARISON:  06/19/2023 FINDINGS: Similar subsegmental atelectasis in the left lung base. No focal airspace consolidation, pleural effusion, or pneumothorax. No cardiomegaly. Tortuous aorta with aortic  atherosclerosis. No acute fracture or destructive lesions. Multilevel thoracic osteophytosis. IMPRESSION: No acute cardiopulmonary abnormality. Electronically Signed   By: Rogelia Myers M.D.   On: 01/23/2024 14:22     .Critical Care  Performed by: Nora Lauraine LABOR, PA-C Authorized by: Cornelio Parkerson A, PA-C   Critical care provider statement:    Critical care time (minutes):  35   Critical care was necessary to treat or prevent imminent or life-threatening deterioration of the following conditions: symtpomatic bradycardia, heart rate in the 30s.   Critical care was time spent personally by me on the following activities:  Blood draw for specimens, development of treatment plan with patient or surrogate, discussions with consultants, discussions with primary provider, evaluation of patient's response to treatment, examination of patient, obtaining history from patient or surrogate, ordering and review of laboratory studies, ordering and review of radiographic studies, pulse oximetry, re-evaluation of patient's condition and review of old charts   Care discussed with: admitting provider      Medications Ordered in the ED - No data  to display                                  Medical Decision Making Amount and/or Complexity of Data Reviewed Labs: ordered. Radiology: ordered.   This patient is a 83 y.o. male who presents to the ED for concern of shortness of breath, bradycardia, this involves an extensive number of treatment options, and is a complaint that carries with it a high risk of complications and morbidity. The emergent differential diagnosis prior to evaluation includes, but is not limited to, symptomatic bradycardia, CHF, pericardial effusion/tamponade, arrhythmias, ACS, COPD, asthma, bronchitis, pneumonia, pneumothorax, PE, anemia  This is not an exhaustive differential.   Past Medical History / Co-morbidities / Social History:  has a past medical history of Alzheimer disease (HCC), Chronic kidney disease, Coronary artery disease, Dysuria (08/10/2020), Hyperlipidemia, LBBB (left bundle branch block) (06/2018), and Obstructive sleep apnea.  Additional history: Chart reviewed. Pertinent results include: seen by pulmonology on 11/06 for chronic shortness of breath pulmonology attributed to  cardiac issues and past smoking history however was noted to be bradycardic at this appointment and was told to follow-up with cardiology for this.  Saw cardiology on 11/07 however looks like that his EKG at that time was in the 70s, and they reported since he was asymptomatic no further management was indicated.   Physical Exam: Physical exam performed. The pertinent findings include: bradycardic in the upper 30s and low 40s on exam.  Speaking in complete sentences on room air.  No other acute physical exam abnormalities.  Lab Tests: I ordered, and personally interpreted labs.  The pertinent results include:  troponin 38 --> 35, calcium  11.4, glucose 151, BNP WNL, RVP negative   Imaging Studies: I ordered imaging studies including CXR. I independently visualized and interpreted imaging which showed NAD. I agree with the  radiologist interpretation.   Cardiac Monitoring:  The patient was maintained on a cardiac monitor.  My attending physician viewed and interpreted the cardiac monitored which showed an underlying rhythm of: bradycardia, junctional rhythm. I agree with this interpretation.   Medications: I ordered medication including heparin  per cardiology request.   Consultations Obtained: I requested consultation with the cardiology on call Dr. Lonni,  and discussed lab and imaging findings as well as pertinent plan - they recommend: medicine admission, will need to d/c Eliquis  and start heparin  in case  he needs pacemaker placement. They will see the patient in consultation   Disposition: After consideration of the diagnostic results and the patients response to treatment, I feel that patient will require admission for symptomatic bradycardia.  Discussed patient with hospitalist Dr. Raenelle accepts patient for admission  This is a shared visit with supervising physician Dr. Levander who has independently evaluated patient & provided guidance in evaluation/management/disposition, in agreement with care   Final diagnoses:  Symptomatic bradycardia  Elevated troponin    ED Discharge Orders     None          Nora Lauraine [name] [name] 01/23/24 1705    Levander Houston, MD 01/24/24 [phone]

## 2024-01-23 NOTE — ED Notes (Signed)
 Patient transported to X-ray

## 2024-01-23 NOTE — ED Notes (Signed)
 Called [facility] for transport 17:43 TC

## 2024-01-23 NOTE — ED Notes (Signed)
 Lab called to inform them of need to collect heparin  level. Courier called by lab. Lab states they will inform this RN when courier is present so we can draw heparin  level.

## 2024-01-23 NOTE — Progress Notes (Signed)
 Brief cardiology note:  Contacted by [facility] ER team regarding patient. He is on no specific AV nodal agents, appears he is in a junctional rhythm in the 40s based on ECG. We will have EP see when he arrives. In the interim, would hold apixaban , can consult pharmacy for heparin  in case device needed.  Shelda Bruckner, MD, PhD, [facility]  [facility]  [place]  Heart & Vascular at [facility] at [facility] [address], [address] [place], [place] [address] [phone]

## 2024-01-23 NOTE — ED Notes (Signed)
 [facility] at bedside to transport patient. Lab called to cancel courier.

## 2024-01-23 NOTE — Progress Notes (Signed)
 ANTICOAGULATION CONSULT NOTE  Pharmacy Consult for Heparin  Indication: atrial fibrillation  Allergies  Allergen Reactions   Darifenacin Other (See Comments) and Anaphylaxis    PT STATES IT MAKES HIM BONKERS AND VERY TIRED  Other Reaction(s): Confusion, Confusion/Altered Mental Status  PT STATES IT MAKES HIM BONKERS AND VERY TIRED, Other reaction(s): Mental Status Changes (intolerance), PT STATES IT MAKES HIM BONKERS AND VERY TIRED, PT STATES IT MAKES HIM BONKERS AND VERY TIRED   Enablex [Darifenacin Hydrobromide Er]     PT STATES IT MAKES HIM BONKERS AND VERY TIRED   Atorvastatin Other (See Comments)    Muscle aches/weakness  Other reaction(s): Other (See Comments)  Muscle aches/weakness  Muscle aches/weakness  Muscle aches/weakness  Muscle aches/weakness  Muscle aches/weakness  Muscle aches/weakness  Other reaction(s): Other (See Comments), Muscle aches/weakness, Muscle aches/weakness, Muscle aches/weakness, Muscle aches/weakness  Muscle aches/weakness    Other reaction(s): Other (See Comments) Muscle aches/weakness Muscle aches/weakness Muscle aches/weakness Muscle aches/weakness Muscle aches/weakness Muscle aches/weakness    Other reaction(s): Other (See Comments), Muscle aches/weakness, Muscle aches/weakness, Muscle aches/weakness, Muscle aches/weakness    Muscle aches/weakness  Other reaction(s): Other (See Comments) Muscle aches/weakness Muscle aches/weakness Muscle aches/weakness Muscle aches/weakness Muscle aches/weakness Muscle aches/weakness   Ceftriaxone Other (See Comments)    Unknown reaction  Other reaction(s): Other (See Comments)  Unknown reaction  Unknown reaction    Unknown reaction  Unknown reaction  Unknown reaction  Other reaction(s): Other (See Comments), Unknown reaction, Unknown reaction, , Unknown reaction  Unknown reaction    Other reaction(s): Other (See Comments) Unknown reaction Unknown reaction  Unknown reaction Unknown  reaction Unknown reaction    Other reaction(s): Other (See Comments), Unknown reaction, Unknown reaction, , Unknown reaction    Unknown reaction  Other reaction(s): Other (See Comments) Unknown reaction Unknown reaction  Unknown reaction Unknown reaction Unknown reaction   Ezetimibe Other (See Comments)    Muscle aches/weakness  Other reaction(s): Other (See Comments)  Muscle aches/weakness  Muscle aches/weakness  Muscle aches/weakness  Other reaction(s): Other (See Comments), Muscle aches/weakness, Muscle aches/weakness  Other reaction(s): Other (See Comments) Muscle aches/weakness Muscle aches/weakness Muscle aches/weakness    Other reaction(s): Other (See Comments), Muscle aches/weakness, Muscle aches/weakness    Muscle aches/weakness  Other reaction(s): Other (See Comments) Muscle aches/weakness Muscle aches/weakness Muscle aches/weakness   Metoprolol  Rash and Dermatitis   Oxybutynin Other (See Comments)    Memory impairment, temperament changes  Other Reaction(s): Confusion  Other reaction(s): Mental Status Changes (intolerance), Memory impairment, temperament changes, Memory impairment, temperament changes, Memory impairment, temperament changes, , Memory impairment, temperament changes   Sulfamethoxazole Other (See Comments)    Childhood allergy  Other reaction(s): Other (See Comments), Childhood allergy, Childhood allergy   Sulfasalazine Other (See Comments)    Childhood allergy  Other reaction(s): Other (See Comments), Unknown per daughter, Unknown per daughter   Levofloxacin Diarrhea and Other (See Comments)    Other Reaction(s): Mental Status Changes  Caused diarheer a   Sulfonamide Derivatives Other (See Comments)    Childhood allergy    Patient Measurements:   Heparin  Dosing Weight: 90 kg  Vital Signs: Temp: 98.4 F (36.9 C) (11/25 1251) Temp Source: Oral (11/25 1251) BP: 118/66 (11/25 1530) Pulse Rate: 41 (11/25 1530)  Labs: Recent Labs     01/23/24 1247  HGB 13.6  HCT 40.6  PLT 199  CREATININE 1.15    CrCl cannot be calculated (Unknown ideal weight.).   Medical History: Past Medical History:  Diagnosis Date   Alzheimer disease (HCC)  Chronic kidney disease    mild insuffiency   Coronary artery disease    LHC 4/10: Mid LAD 40-50%, then 70%, OM1 20-30%, EF 65%. Mid LAD FFR 0.89 (not hemodynamically significant). Medical therapy was continued.   Dysuria 08/10/2020   Hyperlipidemia    LBBB (left bundle branch block) 06/2018   Obstructive sleep apnea     Medications:  (Not in a hospital admission)  Scheduled:  Infusions:  PRN:   Assessment: 87 yom with a history of CKD, CAD, AF on Eliquis , HLD. Patient is presenting with SOB and bradycardia. Heparin  per pharmacy consult placed for atrial fibrillation. Holding eliquis  per cardiology request. May need pacemaker placement.  Patient is on apixaban  prior to arrival. Last dose is unknown. Will require aPTT monitoring due to likely falsely high anti-Xa level secondary to DOAC use.  Hgb 13.6; plt 199  Goal of Therapy:  Heparin  level 0.3-0.7 units/ml aPTT 66-102 seconds Monitor platelets by anticoagulation protocol: Yes   Plan:  No initial heparin  bolus Start heparin  infusion at 1250 units/hr at 10pm tonight (will operate on assumption that last dose was this morning) Check aPTT & anti-Xa level in 8 hours and daily while on heparin  Continue to monitor via aPTT until levels are correlated Continue to monitor H&H and platelets  [name] [name], PharmD, BCPS 01/23/2024 4:40 PM ED Clinical Pharmacist -  [phone]

## 2024-01-23 NOTE — Hospital Course (Signed)
 [name] [name] is a 83 y.o. male with medical history significant for Alzheimer's dementia, PAF on Eliquis , chronic HFpEF, LBBB, HLD, OSA on CPAP who is admitted with bradycardia.

## 2024-01-23 NOTE — Consult Note (Signed)
 Cardiology Consultation   Patient ID: [name] [name] MRN: [id number]; DOB: November 04, 1940  Admit date: 01/23/2024 Date of Consult: 01/23/2024  PCP:  [name] Drafts, NP   [facility] Providers Cardiologist:  [name] Jordan, MD  Cardiology APP:  [name] Jon Garre, PA        History of Present Illness: [name] [name] 83 year old male with paroxysmal atrial fibrillation highly functioning Alzheimer's disease who walks 1 to 2 miles a day does housework, cleaning with moderate coronary artery disease chronic left bundle branch block prior first-degree AV block, prior bradycardia with metoprolol  discontinued in the past here with symptomatic bradycardia, worsening shortness of breath.  EKG in ER revealed heart rates in the 40s.  Appears to be junctional rhythm with underlying left bundle branch block.   Past Medical History:  Diagnosis Date   Alzheimer disease (HCC)    Chronic kidney disease    mild insuffiency   Coronary artery disease    LHC 4/10: Mid LAD 40-50%, then 70%, OM1 20-30%, EF 65%. Mid LAD FFR 0.89 (not hemodynamically significant). Medical therapy was continued.   Dysuria 08/10/2020   Hyperlipidemia    LBBB (left bundle branch block) 06/2018   Obstructive sleep apnea     Past Surgical History:  Procedure Laterality Date   CARDIAC CATHETERIZATION  05/02/2003   single vessel,moderate stenosis mid LAD   CARDIAC CATHETERIZATION  06/12/2008   continue med. therapy   right shoulder surgery     TOTAL HIP ARTHROPLASTY Left 09/14/2018   Procedure: LEFT TOTAL HIP ARTHROPLASTY ANTERIOR APPROACH;  Surgeon: [name] Lonni GRADE, MD;  Location: [facility];  Service: Orthopedics;  Laterality: Left;     Home Medications:  Prior to Admission medications   Medication Sig Start Date End Date Taking? Authorizing Provider  acetaminophen  (TYLENOL ) 325 MG tablet Take 2 tablets (650 mg total) by mouth every 6 (six) hours as needed for mild pain, fever or headache. 11/22/22   [name] Labella, MD  apixaban  (ELIQUIS ) 5 MG TABS tablet TAKE 1 TABLET(5 MG) BY MOUTH TWICE DAILY 12/25/23   [name], [name] M, MD  cholecalciferol (VITAMIN D3) 25 MCG (1000 UNIT) tablet Take 1,000 Units by mouth in the morning and at bedtime.    [provider]  diclofenac  Sodium (VOLTAREN ) 1 % GEL Apply 4 g topically 4 (four) times daily. 11/22/22   Akula, Vijaya, MD  donepezil  (ARICEPT ) 10 MG tablet Take 10 mg by mouth at bedtime. 12/01/16   [provider]  doxycycline  (VIBRAMYCIN ) 100 MG capsule Take 100 mg by mouth 2 (two) times daily. 08/13/22   [provider]  fesoterodine  (TOVIAZ ) 8 MG TB24 tablet Take 8 mg by mouth daily. 12/13/18   [provider]  furosemide  (LASIX ) 20 MG tablet Take one tablet daily. You may take an extra tablet for swelling, weight gain of 2 pounds overnight or 5 pounds in one week. 09/22/23   [name], [name] M, MD  ketoconazole (NIZORAL) 2 % cream Apply 1 application topically daily. 02/27/21   [provider]  metroNIDAZOLE  (METROGEL ) 1 % gel Apply 1 Application topically daily.    [provider]  mirabegron  ER (MYRBETRIQ ) 50 MG TB24 tablet Take 50 mg by mouth every morning.    [provider]  Misc Natural Products (OSTEO BI-FLEX/5-LOXIN ADVANCED) TABS Take 1 tablet by mouth 2 (two) times daily.    [provider]  Multiple Vitamin (MULTIVITAMIN WITH MINERALS) TABS tablet Take 1 tablet by mouth every morning.    [provider]  nitrofurantoin , macrocrystal-monohydrate, (MACROBID ) 100 MG capsule Take 1 capsule (100 mg total) by mouth in the morning. Patient taking differently: Take 100 mg by mouth every evening. 01/23/21   Gonfa, Taye T, MD  Omega-3 Fatty Acids  (FISH OIL ) 1200 MG CAPS Take 1,200 mg by mouth 2 (two) times daily.    [provider]  PRESCRIPTION MEDICATION Inhale into the lungs at bedtime. CPAP    [provider]  rosuvastatin  (CRESTOR ) 10 MG tablet Take 0.5 tablets (5  mg total) by mouth every other day. 09/22/23   [name], [name] M, MD  terbinafine (LAMISIL) 1 % cream Apply 1 Application topically daily.    [provider]  triamcinolone  cream (KENALOG ) 0.1 % Apply 1 Application topically 2 (two) times daily. 10/13/22   [provider]    Scheduled Meds:  Continuous Infusions:  heparin      PRN Meds:   Allergies:    Allergies  Allergen Reactions   Darifenacin Other (See Comments) and Anaphylaxis    PT STATES IT MAKES HIM BONKERS AND VERY TIRED  Other Reaction(s): Confusion, Confusion/Altered Mental Status  PT STATES IT MAKES HIM BONKERS AND VERY TIRED, Other reaction(s): Mental Status Changes (intolerance), PT STATES IT MAKES HIM BONKERS AND VERY TIRED, PT STATES IT MAKES HIM BONKERS AND VERY TIRED   Enablex [Darifenacin Hydrobromide Er]     PT STATES IT MAKES HIM BONKERS AND VERY TIRED   Atorvastatin Other (See Comments)    Muscle aches/weakness  Other reaction(s): Other (See Comments)  Muscle aches/weakness  Muscle aches/weakness  Muscle aches/weakness  Muscle aches/weakness  Muscle aches/weakness  Muscle aches/weakness  Other reaction(s): Other (See Comments), Muscle aches/weakness, Muscle aches/weakness, Muscle aches/weakness, Muscle aches/weakness  Muscle aches/weakness    Other reaction(s): Other (See Comments) Muscle aches/weakness Muscle aches/weakness Muscle aches/weakness Muscle aches/weakness Muscle aches/weakness Muscle aches/weakness    Other reaction(s): Other (See Comments), Muscle aches/weakness, Muscle aches/weakness, Muscle aches/weakness, Muscle aches/weakness    Muscle aches/weakness  Other reaction(s): Other (See Comments) Muscle aches/weakness Muscle aches/weakness Muscle aches/weakness Muscle aches/weakness Muscle aches/weakness Muscle aches/weakness   Ceftriaxone Other (See Comments)    Unknown reaction  Other reaction(s): Other (See Comments)  Unknown reaction  Unknown reaction     Unknown reaction  Unknown reaction  Unknown reaction  Other reaction(s): Other (See Comments), Unknown reaction, Unknown reaction, , Unknown reaction  Unknown reaction    Other reaction(s): Other (See Comments) Unknown reaction Unknown reaction  Unknown reaction Unknown reaction Unknown reaction    Other reaction(s): Other (See Comments), Unknown reaction, Unknown reaction, , Unknown reaction    Unknown reaction  Other reaction(s): Other (See Comments) Unknown reaction Unknown reaction  Unknown reaction Unknown reaction Unknown reaction   Ezetimibe Other (See Comments)    Muscle aches/weakness  Other reaction(s): Other (See Comments)  Muscle aches/weakness  Muscle aches/weakness  Muscle aches/weakness  Other reaction(s): Other (See Comments), Muscle aches/weakness, Muscle aches/weakness  Other reaction(s): Other (See Comments) Muscle aches/weakness Muscle aches/weakness Muscle aches/weakness    Other reaction(s): Other (See Comments), Muscle aches/weakness, Muscle aches/weakness    Muscle aches/weakness  Other reaction(s): Other (See Comments) Muscle aches/weakness Muscle aches/weakness Muscle aches/weakness   Metoprolol  Rash and Dermatitis   Oxybutynin Other (See Comments)    Memory impairment, temperament changes  Other Reaction(s): Confusion  Other reaction(s): Mental Status Changes (intolerance), Memory impairment, temperament changes, Memory impairment, temperament changes, Memory impairment, temperament changes, , Memory impairment, temperament changes   Sulfamethoxazole Other (See Comments)    Childhood allergy  Other reaction(s): Other (See  Comments), Childhood allergy, Childhood allergy   Sulfasalazine Other (See Comments)    Childhood allergy  Other reaction(s): Other (See Comments), Unknown per daughter, Unknown per daughter   Levofloxacin Diarrhea and Other (See Comments)    Other Reaction(s): Mental Status Changes  Caused diarheer a   Sulfonamide  Derivatives Other (See Comments)    Childhood allergy    Social History:   Social History   Socioeconomic History   Marital status: Widowed    Spouse name: Not on file   Number of children: 2   Years of education: Not on file   Highest education level: Not on file  Occupational History   Occupation: [employer]  Tobacco Use   Smoking status: Former    Types: Pipe    Quit date: 02/28/1990    Years since quitting: 33.9   Smokeless tobacco: Never   Tobacco comments:    smoked pipe only   Vaping Use   Vaping status: Never Used  Substance and Sexual Activity   Alcohol use: No    Alcohol/week: 0.0 standard drinks of alcohol    Comment: rarely wine   Drug use: No   Sexual activity: Not Currently    Partners: Female    Comment: widowed  Other Topics Concern   Not on file  Social History Narrative   Not on file   Social Drivers of Health   Financial Resource Strain: Low Risk  (10/11/2023)   Received from [facility]   Overall Financial Resource Strain (CARDIA)    How hard is it for you to pay for the very basics like food, housing, medical care, and heating?: Not very hard  Food Insecurity: No Food Insecurity (10/11/2023)   Received from [facility]   Hunger Vital Sign    Within the past 12 months, you worried that your food would run out before you got the money to buy more.: Never true    Within the past 12 months, the food you bought just didn'[name] last and you didn'[name] have money to get more.: Never true  Transportation Needs: No Transportation Needs (10/11/2023)   Received from [facility] - Transportation    In the past 12 months, has lack of transportation kept you from medical appointments or from getting medications?: No    In the past 12 months, has lack of transportation kept you from meetings, work, or from getting things needed for daily living?: No  Physical Activity: Inactive (10/11/2023)   Received from [facility]   Exercise Vital Sign     On average, how many days per week do you engage in moderate to strenuous exercise (like a brisk walk)?: 0 days    Minutes of Exercise per Session: Not on file  Stress: No Stress Concern Present (10/11/2023)   Received from [facility] - Occupational Stress Questionnaire    Do you feel stress - tense, restless, nervous, or anxious, or unable to sleep at night because your mind is troubled all the time - these days?: Not at all  Social Connections: Somewhat Isolated (10/11/2023)   Received from [facility]   Social Network    How would you rate your social network (family, work, friends)?: Restricted participation with some degree of social isolation  Intimate Partner Violence: Not At Risk (10/11/2023)   Received from [facility]   HITS    Over the last 12 months how often did your partner physically hurt you?:  Never    Over the last 12 months how often did your partner insult you or talk down to you?: Never    Over the last 12 months how often did your partner threaten you with physical harm?: Never    Over the last 12 months how often did your partner scream or curse at you?: Never    Family History:    Family History  Problem Relation Age of Onset   Cancer Mother    Hypertension Mother    Other Father        old age   Hypertension Father      ROS:  Please see the history of present illness.  Denies any fevers chills nausea vomiting.  No significant chest pain. All other ROS reviewed and negative.     Physical Exam/Data: Vitals:   01/23/24 1500 01/23/24 1530 01/23/24 1857 01/23/24 1900  BP: 103/89 118/66 128/86 128/86  Pulse: (!) 41 (!) 41 (!) 40 (!) 43  Resp: 13 16 16    Temp:   (!) 97.5 F (36.4 C)   TempSrc:   Oral   SpO2: 98% 95% 97% 99%  Weight:    83.8 kg  Height:    5' 7 (1.702 [name])   No intake or output data in the 24 hours ending 01/23/24 2025    01/23/2024    7:00 PM 01/05/2024    8:21 AM 01/04/2024   10:11 AM   Last 3 Weights  Weight (lbs) 184 lb 11.9 oz 196 lb 196 lb  Weight (kg) 83.8 kg 88.905 kg 88.905 kg     Body mass index is 28.94 kg/[name].  General:  Well nourished, well developed, in no acute distress HEENT: normal Neck: no JVD Vascular: No carotid bruits; Distal pulses 2+ bilaterally Cardiac: Bradycardic slightly irregular, no murmurs Lungs:  clear to auscultation bilaterally, no wheezing, rhonchi or rales  Abd: soft, nontender, no hepatomegaly  Ext: no edema Musculoskeletal:  No deformities, BUE and BLE strength normal and equal Skin: warm and dry  Neuro: Mentating well.  Answers questions fairly well.  Family present to help with necessary. Psych:  Normal affect   EKG:  The EKG was personally reviewed and demonstrates: As described above Telemetry:  Telemetry was personally reviewed and demonstrates: Junctional, 40  Relevant CV Studies:  Echocardiogram 2023-EF 65% no valvular disease.  Laboratory Data: High Sensitivity Troponin:  No results for input(s): TROPONINIHS in the last 720 hours.   Chemistry Recent Labs  Lab 01/23/24 1247  NA 141  K 4.2  CL 106  CO2 27  GLUCOSE 151*  BUN 22  CREATININE 1.15  CALCIUM  11.4*  GFRNONAA >60  ANIONGAP 9    Recent Labs  Lab 01/23/24 1247  PROT 6.6  ALBUMIN 3.9  AST 24  ALT 24  ALKPHOS 73  BILITOT 0.6   Lipids No results for input(s): CHOL, TRIG, HDL, LABVLDL, LDLCALC, CHOLHDL in the last 168 hours.  Hematology Recent Labs  Lab 01/23/24 1247  WBC 5.6  RBC 4.31  HGB 13.6  HCT 40.6  MCV 94.2  MCH 31.6  MCHC 33.5  RDW 13.0  PLT 199   Thyroid  No results for input(s): TSH, FREET4 in the last 168 hours.  BNP Recent Labs  Lab 01/23/24 1247  PROBNP 239.0    DDimer No results for input(s): DDIMER in the last 168 hours.  Radiology/Studies:  DG Chest 2 View Result Date: 01/23/2024 CLINICAL DATA:  shortness of breath EXAM: CHEST - 2 VIEW COMPARISON:  06/19/2023 FINDINGS: Similar subsegmental  atelectasis in the left lung base. No focal airspace consolidation, pleural effusion, or pneumothorax. No cardiomegaly. Tortuous aorta with aortic atherosclerosis. No acute fracture or destructive lesions. Multilevel thoracic osteophytosis. IMPRESSION: No acute cardiopulmonary abnormality. Electronically Signed   By: Rogelia Myers [name].D.   On: 01/23/2024 14:22     Assessment and Plan:  Symptomatic bradycardia -Possible junctional rhythm in the 40s currently, left bundle branch block.  Prior EKG shows sinus rhythm with left bundle branch block heart rate in the 90s.  Has been active according to family walking approximately a mile to 2 miles daily. Recent worsening shortness of breath.  (He has complained of chronic shortness of breath in the past however family says that this is different) -We will have EP evaluate in the morning for pacemaker.  He seems quite functional and active despite his early Alzheimer's diagnosis.  Paroxysmal atrial fibrillation - Prior EKG showed sinus rhythm with first-degree AV block heart rate 73 bpm.  Beta-blocker was previously discontinued because of bradycardia.  Heart rate at 1 point was in the mid 40s when he was last seen on 01/05/2024 in the office setting.  He actually felt quite well at that time. -Hold Eliquis .  Chronic diastolic heart failure - Has had some increasing shortness of breath which may be bradycardic related.  No overt signs of fluid overload currently.  Resting comfortably in bed flat.  Hyperlipidemia - Compliant with statin therapy rosuvastatin .   In case pacemaker is implanted, he will remain full code.   Risk Assessment/Risk Scores:       CHA2DS2-VASc Score = 5   This indicates a 7.2% annual risk of stroke. The patient's score is based upon: CHF History: 1 HTN History: 1 Diabetes History: 0 Stroke History: 0 Vascular Disease History: 1 Age Score: 2 Gender Score: 0       For questions or updates, please contact [facility] Please consult [email or web address] for contact info under      Signed, Oneil Parchment, MD  01/23/2024 8:25 PM

## 2024-01-23 NOTE — ED Notes (Signed)
 Report called to [facility], RN at [facility].

## 2024-01-23 NOTE — H&P (Signed)
 History and Physical    [name] [name] FMW:[id number] DOB: 01/03/1941 DOA: 01/23/2024  PCP: Wallie Drafts, NP  Patient coming from: Home  I have personally briefly reviewed patient's old medical records in [facility]  Chief Complaint: Shortness of breath  HPI: [name] [name] is a 83 y.o. male with medical history significant for Alzheimer's dementia, PAF on Eliquis , chronic HFpEF, LBBB, HLD, OSA on CPAP who presented to the ED for evaluation of shortness of breath and low heart rate.  Patient states he has been dealing with shortness of breath for years on and off related to his congestive heart failure and atrial fibrillation.  Today he has been experiencing exertional dyspnea and 2 brief episodes of left-sided chest pain which only lasted a few seconds before resolving.  Shortness of breath would improve when resting.  His daughter was checking his pulse ox today and saw that his pulse was getting low, mostly in the 40s, but as low as 38.  He has not had any nausea, vomiting, abdominal pain, diarrhea.  [facility] ED Course  Labs/Imaging on admission: I have personally reviewed following labs and imaging studies.  Initial vitals showed BP 123/61, pulse 43, RR 20, temp 98.4 F, SpO2 100% on room air.  Labs showed WBC 5.6, hemoglobin 13.6, platelets 199, sodium 141, potassium 4.2, bicarb 27, BUN 22, creatinine 1.15, serum glucose 151, albumin 3.9, calcium  11.4, LFTs within normal limits, proBNP 239, troponin T 38 > 35.  SARS-CoV-2, influenza, RSV PCR negative.  2 view chest x-ray negative for acute cardiopulmonary abnormality.  EDP spoke with cardiology who recommended medical admission, start IV heparin , and their team will see in consultation the hospitalist service was consulted for admission.  Review of Systems: All systems reviewed and are negative except as documented in history of present illness above.   Past Medical History:  Diagnosis Date   Alzheimer  disease (HCC)    Chronic kidney disease    mild insuffiency   Coronary artery disease    LHC 4/10: Mid LAD 40-50%, then 70%, OM1 20-30%, EF 65%. Mid LAD FFR 0.89 (not hemodynamically significant). Medical therapy was continued.   Dysuria 08/10/2020   Hyperlipidemia    LBBB (left bundle branch block) 06/2018   Obstructive sleep apnea     Past Surgical History:  Procedure Laterality Date   CARDIAC CATHETERIZATION  05/02/2003   single vessel,moderate stenosis mid LAD   CARDIAC CATHETERIZATION  06/12/2008   continue med. therapy   right shoulder surgery     TOTAL HIP ARTHROPLASTY Left 09/14/2018   Procedure: LEFT TOTAL HIP ARTHROPLASTY ANTERIOR APPROACH;  Surgeon: Vernetta Lonni GRADE, MD;  Location: [facility];  Service: Orthopedics;  Laterality: Left;    Social History: Social History   Tobacco Use   Smoking status: Former    Types: Pipe    Quit date: 02/28/1990    Years since quitting: 33.9   Smokeless tobacco: Never   Tobacco comments:    smoked pipe only   Vaping Use   Vaping status: Never Used  Substance Use Topics   Alcohol use: No    Alcohol/week: 0.0 standard drinks of alcohol    Comment: rarely wine   Drug use: No   Allergies  Allergen Reactions   Darifenacin Other (See Comments) and Anaphylaxis    PT STATES IT MAKES HIM BONKERS AND VERY TIRED  Other Reaction(s): Confusion, Confusion/Altered Mental Status  PT STATES IT MAKES HIM BONKERS AND VERY TIRED, Other reaction(s): Mental Status Changes (  intolerance), PT STATES IT MAKES HIM BONKERS AND VERY TIRED, PT STATES IT MAKES HIM ANGELINE AND VERY TIRED   Enablex [Darifenacin Hydrobromide Er]     PT STATES IT MAKES HIM BONKERS AND VERY TIRED   Atorvastatin Other (See Comments)    Muscle aches/weakness  Other reaction(s): Other (See Comments)  Muscle aches/weakness  Muscle aches/weakness  Muscle aches/weakness  Muscle aches/weakness  Muscle aches/weakness  Muscle aches/weakness  Other reaction(s):  Other (See Comments), Muscle aches/weakness, Muscle aches/weakness, Muscle aches/weakness, Muscle aches/weakness  Muscle aches/weakness    Other reaction(s): Other (See Comments) Muscle aches/weakness Muscle aches/weakness Muscle aches/weakness Muscle aches/weakness Muscle aches/weakness Muscle aches/weakness    Other reaction(s): Other (See Comments), Muscle aches/weakness, Muscle aches/weakness, Muscle aches/weakness, Muscle aches/weakness    Muscle aches/weakness  Other reaction(s): Other (See Comments) Muscle aches/weakness Muscle aches/weakness Muscle aches/weakness Muscle aches/weakness Muscle aches/weakness Muscle aches/weakness   Ceftriaxone Other (See Comments)    Unknown reaction  Other reaction(s): Other (See Comments)  Unknown reaction  Unknown reaction    Unknown reaction  Unknown reaction  Unknown reaction  Other reaction(s): Other (See Comments), Unknown reaction, Unknown reaction, , Unknown reaction  Unknown reaction    Other reaction(s): Other (See Comments) Unknown reaction Unknown reaction  Unknown reaction Unknown reaction Unknown reaction    Other reaction(s): Other (See Comments), Unknown reaction, Unknown reaction, , Unknown reaction    Unknown reaction  Other reaction(s): Other (See Comments) Unknown reaction Unknown reaction  Unknown reaction Unknown reaction Unknown reaction   Ezetimibe Other (See Comments)    Muscle aches/weakness  Other reaction(s): Other (See Comments)  Muscle aches/weakness  Muscle aches/weakness  Muscle aches/weakness  Other reaction(s): Other (See Comments), Muscle aches/weakness, Muscle aches/weakness  Other reaction(s): Other (See Comments) Muscle aches/weakness Muscle aches/weakness Muscle aches/weakness    Other reaction(s): Other (See Comments), Muscle aches/weakness, Muscle aches/weakness    Muscle aches/weakness  Other reaction(s): Other (See Comments) Muscle aches/weakness Muscle aches/weakness Muscle aches/weakness    Metoprolol  Rash and Dermatitis   Oxybutynin Other (See Comments)    Memory impairment, temperament changes  Other Reaction(s): Confusion  Other reaction(s): Mental Status Changes (intolerance), Memory impairment, temperament changes, Memory impairment, temperament changes, Memory impairment, temperament changes, , Memory impairment, temperament changes   Sulfamethoxazole Other (See Comments)    Childhood allergy  Other reaction(s): Other (See Comments), Childhood allergy, Childhood allergy   Sulfasalazine Other (See Comments)    Childhood allergy  Other reaction(s): Other (See Comments), Unknown per daughter, Unknown per daughter   Levofloxacin Diarrhea and Other (See Comments)    Other Reaction(s): Mental Status Changes  Caused diarheer a   Sulfonamide Derivatives Other (See Comments)    Childhood allergy    Family History  Problem Relation Age of Onset   Cancer Mother    Hypertension Mother    Other Father        old age   Hypertension Father      Prior to Admission medications   Medication Sig Start Date End Date Taking? Authorizing Provider  acetaminophen  (TYLENOL ) 325 MG tablet Take 2 tablets (650 mg total) by mouth every 6 (six) hours as needed for mild pain, fever or headache. 11/22/22   Cherlyn Labella, MD  apixaban  (ELIQUIS ) 5 MG TABS tablet TAKE 1 TABLET(5 MG) BY MOUTH TWICE DAILY 12/25/23   Jordan, Peter M, MD  cholecalciferol (VITAMIN D3) 25 MCG (1000 UNIT) tablet Take 1,000 Units by mouth in the morning and at bedtime.    [provider]  diclofenac  Sodium (VOLTAREN ) 1 %  GEL Apply 4 g topically 4 (four) times daily. 11/22/22   Cherlyn Labella, MD  donepezil  (ARICEPT ) 10 MG tablet Take 10 mg by mouth at bedtime. 12/01/16   [provider]  doxycycline  (VIBRAMYCIN ) 100 MG capsule Take 100 mg by mouth 2 (two) times daily. 08/13/22   [provider]  fesoterodine  (TOVIAZ ) 8 MG TB24 tablet Take 8 mg by mouth daily. 12/13/18   [provider]  furosemide  (LASIX ) 20 MG tablet Take one tablet daily. You may take an extra tablet for swelling, weight gain of 2 pounds overnight or 5 pounds in one week. 09/22/23   Jordan, Peter M, MD  ketoconazole (NIZORAL) 2 % cream Apply 1 application topically daily. 02/27/21   [provider]  metroNIDAZOLE  (METROGEL ) 1 % gel Apply 1 Application topically daily.    [provider]  mirabegron  ER (MYRBETRIQ ) 50 MG TB24 tablet Take 50 mg by mouth every morning.    [provider]  Misc Natural Products (OSTEO BI-FLEX/5-LOXIN ADVANCED) TABS Take 1 tablet by mouth 2 (two) times daily.    [provider]  Multiple Vitamin (MULTIVITAMIN WITH MINERALS) TABS tablet Take 1 tablet by mouth every morning.    [provider]  nitrofurantoin , macrocrystal-monohydrate, (MACROBID ) 100 MG capsule Take 1 capsule (100 mg total) by mouth in the morning. Patient taking differently: Take 100 mg by mouth every evening. 01/23/21   Gonfa, Taye T, MD  Omega-3 Fatty Acids  (FISH OIL ) 1200 MG CAPS Take 1,200 mg by mouth 2 (two) times daily.    [provider]  PRESCRIPTION MEDICATION Inhale into the lungs at bedtime. CPAP    [provider]  rosuvastatin  (CRESTOR ) 10 MG tablet Take 0.5 tablets (5 mg total) by mouth every other day. 09/22/23   Jordan, Peter M, MD  terbinafine (LAMISIL) 1 % cream Apply 1 Application topically daily.    [provider]  triamcinolone  cream (KENALOG ) 0.1 % Apply 1 Application topically 2 (two) times daily. 10/13/22   [provider]    Physical Exam: Vitals:   01/23/24 1500 01/23/24 1530 01/23/24 1857 01/23/24 1900  BP: 103/89 118/66 128/86 128/86  Pulse: (!) 41 (!) 41 (!) 40 (!) 43  Resp: 13 16 16    Temp:   (!) 97.5 F (36.4 C)   TempSrc:   Oral   SpO2: 98% 95% 97% 99%  Weight:    83.8 kg  Height:    5' 7 (1.702 m)   Constitutional: Resting in bed.  NAD, calm, comfortable Eyes: EOMI, lids and  conjunctivae normal ENMT: Mucous membranes are moist. Posterior pharynx clear of any exudate or lesions.Normal dentition.  Neck: normal, supple, no masses. Respiratory: clear to auscultation bilaterally, no wheezing, no crackles. Normal respiratory effort. No accessory muscle use.  Cardiovascular: Bradycardic, no murmurs / rubs / gallops. No extremity edema. 2+ pedal pulses. Abdomen: no tenderness Musculoskeletal: no clubbing / cyanosis. No joint deformity upper and lower extremities. Good ROM, no contractures. Normal muscle tone.  Skin: no rashes, lesions, ulcers. No induration Neurologic: Sensation intact. Strength 5/5 in all 4.  Psychiatric: Normal judgment and insight. Alert and oriented x 3. Normal mood.   EKG: Personally reviewed. Junctional rhythm, rate 45, LBBB.  Previous EKG showed sinus rhythm rate 73 with first-degree AV block and LBBB.  Assessment/Plan Principal Problem:   Bradycardia Active Problems:   Hyperlipidemia   OSA on CPAP   Chronic heart failure with preserved ejection fraction (HFpEF, >= 50%) (HCC)   Alzheimer's dementia (  HCC)   Paroxysmal atrial fibrillation (HCC)   Hypercalcemia   [name] [name] is a 83 y.o. male with medical history significant for Alzheimer's dementia, PAF on Eliquis , chronic HFpEF, LBBB, HLD, OSA on CPAP who is admitted with bradycardia.  Assessment and Plan: Symptomatic bradycardia: Patient with exertional dyspnea suspected related to symptomatic bradycardia.  EKG suggestive of junctional rhythm, rate 40s.  He is no longer on beta-blocker. - Cardiology to have EP evaluate in a.m. for pacemaker - Check magnesium , TSH  Paroxysmal atrial fibrillation: Eliquis  on hold per cardiology in case he goes for pacemaker.  Has been started on IV heparin  per pharmacy.  Chronic HFpEF: Appears to be compensated.  Last EF 70-75%.  Continue home oral Lasix  20 mg daily.  Hypercalcemia: Mild hypercalcemia noted on labs since September.  Discontinue  vitamin D3 and calcium  supplements.  Check PTH and intact calcium  levels.  Hyperlipidemia: Continue rosuvastatin .  OSA: Continue CPAP nightly.  Alzheimer's: He is active and functional at baseline.  Continue Aricept .  Delirium precautions.   DVT prophylaxis: IV heparin  Code Status: Full code, discussed with patient and daughter at bedside Family Communication: Daughter and son at bedside Disposition Plan: From home, dispo pending clinical progress Consults called: Cardiology Severity of Illness: The appropriate patient status for this patient is INPATIENT. Inpatient status is judged to be reasonable and necessary in order to provide the required intensity of service to ensure the patient's safety. The patient's presenting symptoms, physical exam findings, and initial radiographic and laboratory data in the context of their chronic comorbidities is felt to place them at high risk for further clinical deterioration. Furthermore, it is not anticipated that the patient will be medically stable for discharge from the hospital within 2 midnights of admission.   * I certify that at the point of admission it is my clinical judgment that the patient will require inpatient hospital care spanning beyond 2 midnights from the point of admission due to high intensity of service, high risk for further deterioration and high frequency of surveillance required.[name] [name] Jorie Blanch MD [facility]  If 7PM-7AM, please contact night-coverage [email or web address]  01/23/2024, 8:56 PM

## 2024-01-24 ENCOUNTER — Inpatient Hospital Stay (HOSPITAL_COMMUNITY)

## 2024-01-24 ENCOUNTER — Encounter (HOSPITAL_COMMUNITY): Payer: Self-pay | Admitting: Internal Medicine

## 2024-01-24 ENCOUNTER — Encounter (HOSPITAL_COMMUNITY)
Admission: EM | Disposition: A | Discharge status: Home / Self Care | Payer: Self-pay | Source: Home / Self Care | Attending: Internal Medicine

## 2024-01-24 DIAGNOSIS — I447 Left bundle-branch block, unspecified: Secondary | ICD-10-CM

## 2024-01-24 DIAGNOSIS — I480 Paroxysmal atrial fibrillation: Secondary | ICD-10-CM | POA: Diagnosis not present

## 2024-01-24 DIAGNOSIS — I440 Atrioventricular block, first degree: Secondary | ICD-10-CM

## 2024-01-24 DIAGNOSIS — R001 Bradycardia, unspecified: Secondary | ICD-10-CM | POA: Diagnosis not present

## 2024-01-24 DIAGNOSIS — I441 Atrioventricular block, second degree: Secondary | ICD-10-CM

## 2024-01-24 HISTORY — PX: PACEMAKER IMPLANT: EP1218

## 2024-01-24 LAB — CBC
HCT: 40.4 % (ref 39.0–52.0)
Hemoglobin: 13.7 g/dL (ref 13.0–17.0)
MCH: 31.7 pg (ref 26.0–34.0)
MCHC: 33.9 g/dL (ref 30.0–36.0)
MCV: 93.5 fL (ref 80.0–100.0)
Platelets: 211 K/uL (ref 150–400)
RBC: 4.32 MIL/uL (ref 4.22–5.81)
RDW: 13.1 % (ref 11.5–15.5)
WBC: 5.9 K/uL (ref 4.0–10.5)
nRBC: 0 % (ref 0.0–0.2)

## 2024-01-24 LAB — BASIC METABOLIC PANEL WITH GFR
Anion gap: 9 (ref 5–15)
BUN: 23 mg/dL (ref 8–23)
CO2: 26 mmol/L (ref 22–32)
Calcium: 10.3 mg/dL (ref 8.9–10.3)
Chloride: 106 mmol/L (ref 98–111)
Creatinine, Ser: 1.28 mg/dL — ABNORMAL HIGH (ref 0.61–1.24)
GFR, Estimated: 55 mL/min — ABNORMAL LOW (ref >60)
Glucose, Bld: 122 mg/dL — ABNORMAL HIGH (ref 70–99)
Potassium: 4.1 mmol/L (ref 3.5–5.1)
Sodium: 141 mmol/L (ref 135–145)

## 2024-01-24 LAB — SURGICAL PCR SCREEN
MRSA, PCR: NEGATIVE
Staphylococcus aureus: NEGATIVE

## 2024-01-24 LAB — APTT: aPTT: 93 s — ABNORMAL HIGH (ref 24–36)

## 2024-01-24 LAB — HEPARIN LEVEL (UNFRACTIONATED): Heparin Unfractionated: 0.76 [IU]/mL — ABNORMAL HIGH (ref 0.30–0.70)

## 2024-01-24 SURGERY — PACEMAKER IMPLANT
Anesthesia: LOCAL

## 2024-01-24 MED ORDER — LIDOCAINE HCL (PF) 1 % IJ SOLN
INTRAMUSCULAR | Status: AC
  Filled 2024-01-24: qty 60

## 2024-01-24 MED ORDER — LIDOCAINE HCL (PF) 1 % IJ SOLN
INTRAMUSCULAR | Status: DC | PRN
  Administered 2024-01-24: 60 mL

## 2024-01-24 MED ORDER — SODIUM CHLORIDE 0.9 % IV SOLN
80.0000 mg | INTRAVENOUS | Status: AC
  Administered 2024-01-24: 80 mg

## 2024-01-24 MED ORDER — SODIUM CHLORIDE 0.9 % IV SOLN: INTRAVENOUS | Status: DC

## 2024-01-24 MED ORDER — CHLORHEXIDINE GLUCONATE 4 % EX SOLN: 60.0000 mL | Freq: Once | CUTANEOUS | Status: DC

## 2024-01-24 MED ORDER — SODIUM CHLORIDE 0.9% FLUSH: 3.0000 mL | INTRAVENOUS | Status: DC | PRN

## 2024-01-24 MED ORDER — SODIUM CHLORIDE 0.9% FLUSH: 3.0000 mL | Freq: Two times a day (BID) | INTRAVENOUS | Status: DC

## 2024-01-24 MED ORDER — SODIUM CHLORIDE 0.9 % IV SOLN
INTRAVENOUS | Status: AC
  Filled 2024-01-24: qty 2

## 2024-01-24 MED ORDER — HEPARIN (PORCINE) IN NACL 1000-0.9 UT/500ML-% IV SOLN
INTRAVENOUS | Status: DC | PRN
  Administered 2024-01-24: 500 mL

## 2024-01-24 MED ORDER — VANCOMYCIN HCL IN DEXTROSE 1-5 GM/200ML-% IV SOLN
1000.0000 mg | INTRAVENOUS | Status: AC
  Administered 2024-01-24: 1000 mg via INTRAVENOUS

## 2024-01-24 MED ORDER — VANCOMYCIN HCL IN DEXTROSE 1-5 GM/200ML-% IV SOLN
INTRAVENOUS | Status: AC
  Filled 2024-01-24: qty 200

## 2024-01-24 MED ORDER — SODIUM CHLORIDE 0.9 % IV SOLN: 250.0000 mL | INTRAVENOUS | Status: DC

## 2024-01-24 SURGICAL SUPPLY — 11 items
CABLE SURGICAL S-101-97-12 (CABLE) ×1 IMPLANT
CATH RIGHTSITE C315HIS02 (CATHETERS) IMPLANT
ELECT DEFIB PAD ADLT CADENCE (PAD) IMPLANT
IPG PACE AZUR XT DR MRI W1DR01 (Pacemaker) IMPLANT
LEAD CAPSURE NOVUS 5076-52CM (Lead) IMPLANT
LEAD SELECT SECURE 3830 383069 (Lead) IMPLANT
PAD DEFIB RADIO PHYSIO CONN (PAD) ×1 IMPLANT
SHEATH 7FR PRELUDE SNAP 13 (SHEATH) IMPLANT
SLITTER 6232ADJ (MISCELLANEOUS) IMPLANT
TRAY PACEMAKER INSERTION (PACKS) ×1 IMPLANT
WIRE HI TORQ VERSACORE-J 145CM (WIRE) IMPLANT

## 2024-01-24 NOTE — Interval H&P Note (Signed)
 History and Physical Interval Note:  01/24/2024 8:51 AM  [name] [name]  has presented today for surgery, with the diagnosis of tachy bradycardia.  The various methods of treatment have been discussed with the patient and family. After consideration of risks, benefits and other options for treatment, the patient has consented to  Procedure(s): PACEMAKER IMPLANT (N/A) as a surgical intervention.  The patient's history has been reviewed, patient examined, no change in status, stable for surgery.  I have reviewed the patient's chart and labs.  Questions were answered to the patient's satisfaction.     [name] [name]

## 2024-01-24 NOTE — Plan of Care (Signed)
   Problem: Safety: Goal: Ability to remain free from injury will improve Outcome: Progressing

## 2024-01-24 NOTE — Discharge Instructions (Addendum)
 After Your Pacemaker   You have a Medtronic Pacemaker  If you have a Medtronic or Biotronik device, plug in your home monitor once you get home, and no manual interaction is required.   If you have an [employer] or [employer] device, plug your home monitor once you get home, sit near the device, and press the large activation button. Sit nearby until the process is complete, usually notated by lights on the monitor.   If you were set up for monitoring using an app on your phone, make sure the app remains open in the background and the Bluetooth remains on.  ACTIVITY Do not lift your arm above shoulder height for 1 week after your procedure. After 7 days, you may progress as below.  You should remove your sling 24 hours after your procedure, unless otherwise instructed by your provider.     Wednesday January 31, 2024  Thursday February 01, 2024 Friday February 02, 2024 Saturday February 03, 2024   Do not lift, push, pull, or carry anything over 10 pounds with the affected arm until 6 weeks (Wednesday March 06, 2024 ) after your procedure.   You may drive AFTER your wound check, unless you have been told otherwise by your provider.   Ask your healthcare provider when you can go back to work   INCISION/Dressing Resume Eliquis  SATURDAY 11/29 with the am dose  If large square, outer bandage is left in place, this can be removed after 24 hours from your procedure. Do not remove steri-strips or glue as below.   If a PRESSURE DRESSING (a bulky dressing that usually goes up over your shoulder) was applied or left in place, please follow instructions given by your provider on when to return to have this removed.   Monitor your Pacemaker site for redness, swelling, and drainage. Call the device clinic at [phone] if you experience these symptoms or fever/chills.  If your incision is sealed with Dermabond (glue), you may shower 24 hrs after your procedure or when told by your provider. Do  not remove the glue or let the shower hit directly on your site. You may wash around your site with soap and water.    If you were discharged in a sling, please do not wear this during the day more than 48 hours after your surgery unless otherwise instructed. This may increase the risk of stiffness and soreness in your shoulder.   Avoid lotions, ointments, or perfumes over your incision until it is well-healed.  You may use a hot tub or a pool AFTER your wound check appointment if the incision is completely closed.  Pacemaker Alerts:  Some alerts are vibratory and others beep. These are NOT emergencies. Please call our office to let us  know. If this occurs at night or on weekends, it can wait until the next business day. Send a remote transmission.  If your device is capable of reading fluid status (for heart failure), you will be offered monthly monitoring to review this with you.   DEVICE MANAGEMENT Remote monitoring is used to monitor your pacemaker from home. This monitoring is scheduled every 91 days by our office. It allows us  to keep an eye on the functioning of your device to ensure it is working properly. You will routinely see your Electrophysiologist annually (more often if necessary).  This will appear as a REMOTE check on your MyChart schedule. These are automatic and there is nothing for you to manually do unless otherwise instructed.  You  should receive your ID card for your new device in 4-8 weeks. Keep this card with you at all times once received. Consider wearing a medical alert bracelet or necklace.  Your Pacemaker may be MRI compatible. This will be discussed at your next office visit/wound check.  You should avoid contact with strong electric or magnetic fields.   Do not use amateur (ham) radio equipment or electric (arc) welding torches. MP3 player headphones with magnets should not be used. Some devices are safe to use if held at least 12 inches (30 cm) from your Pacemaker.  These include power tools, lawn mowers, and speakers. If you are unsure if something is safe to use, ask your health care provider.  When using your cell phone, hold it to the ear that is on the opposite side from the Pacemaker. Do not leave your cell phone in a pocket over the Pacemaker.  You may safely use electric blankets, heating pads, computers, and microwave ovens.  Call the office right away if: You have chest pain. You feel more short of breath than you have felt before. You feel more light-headed than you have felt before. Your incision starts to open up.  This information is not intended to replace advice given to you by your health care provider. Make sure you discuss any questions you have with your health care provider.

## 2024-01-24 NOTE — H&P (View-Only) (Signed)
 ELECTROPHYSIOLOGY CONSULT NOTE    Patient ID: [name] [name] MRN: [id number], DOB/AGE: 04-03-1940 83 y.o.  Admit date: 01/23/2024 Date of Consult: 01/24/2024  Primary Physician: McClanahan, Kyra, NP Primary Cardiologist: Peter Jordan, MD  Electrophysiologist: New   Referring Provider: Dr. Lonni  Patient Profile: [name] [name] is a 83 y.o. male with a history of alzheimers, PAF, CAD, HTN, and bradycardia who is being seen today for the evaluation of junctional rhythm at the request of Dr. Francina.  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  EKG in ER revealed heart rates in the 40s.  Appears to be junctional rhythm with underlying left bundle branch block.   Pt is awake and alert to person and place this am. Spoke with daughter on phone who says he is fairly functional with ADLs. Needs assistance with food, some with bathing. Otherwise gets around the house fine. Has previously had junctional bradycardia on monitoring that was symptomatic, BB eventually stopped. Has been having more and more episodes of fatigue and SOB, correlating with HRs in 40s on pulse ox at home.   Labs Potassium4.1 (11/26 9461) Magnesium   2.2 (11/25 1904) Creatinine, ser  1.28* (11/26 0538) PLT  211 (11/26 0538) HGB  13.7 (11/26 0538) WBC 5.9 (11/26 0538)  .    Past Medical History:  Diagnosis Date   Alzheimer disease (HCC)    Chronic kidney disease    mild insuffiency   Coronary artery disease    LHC 4/10: Mid LAD 40-50%, then 70%, OM1 20-30%, EF 65%. Mid LAD FFR 0.89 (not hemodynamically significant). Medical therapy was continued.   Dysuria  08/10/2020   Hyperlipidemia    LBBB (left bundle branch block) 06/2018   Obstructive sleep apnea      Surgical History:  Past Surgical History:  Procedure Laterality Date   CARDIAC CATHETERIZATION  05/02/2003   single vessel,moderate stenosis mid LAD   CARDIAC CATHETERIZATION  06/12/2008   continue med. therapy   right shoulder surgery     TOTAL HIP ARTHROPLASTY Left 09/14/2018   Procedure: LEFT TOTAL HIP ARTHROPLASTY ANTERIOR APPROACH;  Surgeon: Vernetta Lonni GRADE, MD;  Location: [facility];  Service: Orthopedics;  Laterality: Left;     Medications Prior to Admission  Medication Sig Dispense Refill Last Dose/Taking   acetaminophen  (TYLENOL ) 325 MG tablet Take 2 tablets (650 mg total) by mouth every 6 (six) hours as needed for mild pain, fever or headache.   01/23/2024 Morning   apixaban  (ELIQUIS ) 5 MG TABS tablet TAKE 1 TABLET(5 MG) BY MOUTH TWICE DAILY 180 tablet 1 01/23/2024 at  8:00 AM   diclofenac  Sodium (VOLTAREN ) 1 % GEL Apply 4 g topically 4 (four) times daily. (Patient taking differently: Apply 4 g topically daily as needed (for pain).) 50 g 0 Past Week   donepezil  (ARICEPT ) 10 MG tablet Take 10 mg by mouth at bedtime.   01/22/2024 Bedtime   doxycycline  (VIBRAMYCIN ) 100 MG capsule Take 100 mg by mouth 2 (two) times daily.   01/23/2024 Morning   fesoterodine  (TOVIAZ ) 8 MG TB24 tablet Take 8 mg by mouth daily.   01/23/2024 Morning   furosemide  (LASIX ) 20 MG tablet Take one tablet daily. You may take an extra tablet for  swelling, weight gain of 2 pounds overnight or 5 pounds in one week.   01/23/2024 Morning   ketoconazole (NIZORAL) 2 % cream Apply 1 application  topically daily as needed for irritation.   Past Week   metroNIDAZOLE  (METROGEL ) 1 % gel Apply 1 Application topically daily.   Past Week   mirabegron  ER (MYRBETRIQ ) 50 MG TB24 tablet Take 50 mg by mouth every morning.   01/23/2024 Morning   nitrofurantoin , macrocrystal-monohydrate, (MACROBID ) 100 MG capsule Take 1 capsule  (100 mg total) by mouth in the morning. (Patient taking differently: Take 100 mg by mouth every evening.)   01/22/2024 Evening   Omega-3 Fatty Acids  (FISH OIL ) 1200 MG CAPS Take 1,200 mg by mouth 2 (two) times daily.   01/23/2024 Morning   PRESCRIPTION MEDICATION Inhale into the lungs at bedtime. CPAP   01/22/2024 Evening   rosuvastatin  (CRESTOR ) 10 MG tablet Take 0.5 tablets (5 mg total) by mouth every other day. (Patient taking differently: Take 5 mg by mouth every other day. Mon wed and Fridays) 45 tablet 1 01/22/2024 Morning   terbinafine (LAMISIL) 1 % cream Apply 1 Application topically daily as needed (rash).   Past Week   triamcinolone  cream (KENALOG ) 0.1 % Apply 1 Application topically 2 (two) times daily as needed (rash).   Past Week   Multiple Vitamin (MULTIVITAMIN WITH MINERALS) TABS tablet Take 1 tablet by mouth every morning. (Patient not taking: Reported on 01/23/2024)   Not Taking    Inpatient Medications:   chlorhexidine   60 mL Topical Once   chlorhexidine   60 mL Topical Once   donepezil   10 mg Oral QHS   furosemide   20 mg Oral Daily   gentamicin  (GARAMYCIN ) 80 mg in sodium chloride  0.9 % 500 mL irrigation  80 mg Irrigation On Call   rosuvastatin   5 mg Oral QODAY   sodium chloride  flush  3 mL Intravenous Q12H   sodium chloride  flush  3 mL Intravenous Q12H    Allergies:  Allergies  Allergen Reactions   Darifenacin Other (See Comments) and Anaphylaxis    PT STATES IT MAKES HIM BONKERS AND VERY TIRED  Other Reaction(s): Confusion, Confusion/Altered Mental Status  PT STATES IT MAKES HIM BONKERS AND VERY TIRED, Other reaction(s): Mental Status Changes (intolerance), PT STATES IT MAKES HIM BONKERS AND VERY TIRED, PT STATES IT MAKES HIM BONKERS AND VERY TIRED   Enablex [Darifenacin Hydrobromide Er]     PT STATES IT MAKES HIM BONKERS AND VERY TIRED   Atorvastatin Other (See Comments)    Muscle aches/weakness  Other reaction(s): Other (See Comments)  Muscle  aches/weakness  Muscle aches/weakness  Muscle aches/weakness  Muscle aches/weakness  Muscle aches/weakness  Muscle aches/weakness  Other reaction(s): Other (See Comments), Muscle aches/weakness, Muscle aches/weakness, Muscle aches/weakness, Muscle aches/weakness  Muscle aches/weakness    Other reaction(s): Other (See Comments) Muscle aches/weakness Muscle aches/weakness Muscle aches/weakness Muscle aches/weakness Muscle aches/weakness Muscle aches/weakness    Other reaction(s): Other (See Comments), Muscle aches/weakness, Muscle aches/weakness, Muscle aches/weakness, Muscle aches/weakness    Muscle aches/weakness  Other reaction(s): Other (See Comments) Muscle aches/weakness Muscle aches/weakness Muscle aches/weakness Muscle aches/weakness Muscle aches/weakness Muscle aches/weakness   Ceftriaxone Other (See Comments)    Unknown reaction  Other reaction(s): Other (See Comments)  Unknown reaction  Unknown reaction    Unknown reaction  Unknown reaction  Unknown reaction  Other reaction(s): Other (See Comments), Unknown reaction, Unknown reaction, , Unknown reaction  Unknown reaction    Other reaction(s): Other (See Comments) Unknown reaction Unknown reaction  Unknown reaction Unknown reaction Unknown reaction    Other reaction(s): Other (See Comments), Unknown reaction, Unknown reaction, , Unknown reaction    Unknown reaction  Other reaction(s): Other (See Comments) Unknown reaction Unknown reaction  Unknown reaction Unknown reaction Unknown reaction   Ezetimibe Other (See Comments)    Muscle aches/weakness  Other reaction(s): Other (See Comments)  Muscle aches/weakness  Muscle aches/weakness  Muscle aches/weakness  Other reaction(s): Other (See Comments), Muscle aches/weakness, Muscle aches/weakness  Other reaction(s): Other (See Comments) Muscle aches/weakness Muscle aches/weakness Muscle aches/weakness    Other reaction(s): Other (See Comments), Muscle aches/weakness,  Muscle aches/weakness    Muscle aches/weakness  Other reaction(s): Other (See Comments) Muscle aches/weakness Muscle aches/weakness Muscle aches/weakness   Metoprolol  Rash and Dermatitis   Oxybutynin Other (See Comments)    Memory impairment, temperament changes  Other Reaction(s): Confusion  Other reaction(s): Mental Status Changes (intolerance), Memory impairment, temperament changes, Memory impairment, temperament changes, Memory impairment, temperament changes, , Memory impairment, temperament changes   Sulfamethoxazole Other (See Comments)    Childhood allergy  Other reaction(s): Other (See Comments), Childhood allergy, Childhood allergy   Sulfasalazine Other (See Comments)    Childhood allergy  Other reaction(s): Other (See Comments), Unknown per daughter, Unknown per daughter   Levofloxacin Diarrhea and Other (See Comments)    Other Reaction(s): Mental Status Changes  Caused diarheer a   Sulfonamide Derivatives Other (See Comments)    Childhood allergy    Family History  Problem Relation Age of Onset   Cancer Mother    Hypertension Mother    Other Father        old age   Hypertension Father      Physical Exam: Vitals:   01/23/24 1900 01/23/24 2300 01/24/24 0100 01/24/24 0355  BP: 128/86 (!) 121/52  103/86  Pulse: (!) 43 (!) 49 (!) 36 (!) 42  Resp:  16  19  Temp:  97.8 F (36.6 C)  97.8 F (36.6 C)  TempSrc:  Tympanic  Oral  SpO2: 99% 95% 98% 98%  Weight: 83.8 kg   85.4 kg  Height: 5' 7 (1.702 m)   5' 7 (1.702 m)    GEN- NAD, A&O x 3, normal affect HEENT: Normocephalic, atraumatic Lungs- CTAB, Normal effort.  Heart- Slow but regular rate and rhythm, No M/G/R.  GI- Soft, NT, ND.  Extremities- No clubbing, cyanosis, or edema   Radiology/Studies: DG Chest 2 View Result Date: 01/23/2024 CLINICAL DATA:  shortness of breath EXAM: CHEST - 2 VIEW COMPARISON:  06/19/2023 FINDINGS: Similar subsegmental atelectasis in the left lung base. No focal airspace  consolidation, pleural effusion, or pneumothorax. No cardiomegaly. Tortuous aorta with aortic atherosclerosis. No acute fracture or destructive lesions. Multilevel thoracic osteophytosis. IMPRESSION: No acute cardiopulmonary abnormality. Electronically Signed   By: Rogelia Myers M.D.   On: 01/23/2024 14:22    ZXH:glwrupnwjo rhythm in the 40s (personally reviewed)  TELEMETRY: junctional rhythm 35-42 bpm (personally reviewed)  Assessment/Plan:  Symptomatic brady Tachy-brady Junctional rhythm No reversible cause, having previously stopped BB Explained risks, benefits, and alternatives to PPM implantation, including but not limited to bleeding, infection, pneumothorax, pericardial effusion, lead dislodgement, heart attack, stroke, or death.  Pt verbalized understanding and agrees to proceed.    OAC held   Paroxysmal AF Eliquis  held.   Alzheimers Fairly functional physically.  Will need family to sign consent.   For questions or updates, please contact [facility] Please consult [email or web address] for contact info under     Signed, Ozell Prentice Passey, PA-C  01/24/2024, 7:17 AM

## 2024-01-24 NOTE — Discharge Summary (Signed)
 Physician Discharge Summary  [name] [name] FMW:[id number] DOB: 20-Nov-1940 DOA: 01/23/2024  PCP: McClanahan, Kyra, NP  Admit date: 01/23/2024 Discharge date: 01/24/2024 30 Day Unplanned Readmission Risk Score    Flowsheet Row ED to Hosp-Admission (Current) from 01/23/2024 in [facility] 3E HF PCU  30 Day Unplanned Readmission Risk Score (%) 12.69 Filed at 01/24/2024 1200    This score is the patient's risk of an unplanned readmission within 30 days of being discharged (0 -100%). The score is based on dignosis, age, lab data, medications, orders, and past utilization.   Low:  0-14.9   Medium: 15-21.9   High: 22-29.9   Extreme: 30 and above          Admitted From: Home Disposition: Home  Recommendations for Outpatient Follow-up:  Follow up with PCP in 1-2 weeks Please obtain BMP/CBC in one week Follow-up with EP cardiology per their recommended time and date Please follow up with your PCP on the following pending results: Unresulted Labs (From admission, onward)     Start     Ordered   01/24/24 0500  CBC  Daily,   R      01/23/24 1715   01/23/24 2034  PTH, intact and calcium   Once,   R        01/23/24 2033              Home Health: None Equipment/Devices: None  Discharge Condition: Stable CODE STATUS: Full code Diet recommendation:  Diet Order             Diet regular Room service appropriate? Yes; Fluid consistency: Thin  Diet effective now                 Due to overnight/brief hospitalization, I have copied HPI and ED course as below.  HPI: [name] [name] is a 83 y.o. male with medical history significant for Alzheimer's dementia, PAF on Eliquis , chronic HFpEF, LBBB, HLD, OSA on CPAP who presented to the ED for evaluation of shortness of breath and low heart rate.   Patient states he has been dealing with shortness of breath for years on and off related to his congestive heart failure and atrial fibrillation.  Today he has been experiencing  exertional dyspnea and 2 brief episodes of left-sided chest pain which only lasted a few seconds before resolving.  Shortness of breath would improve when resting.  His daughter was checking his pulse ox today and saw that his pulse was getting low, mostly in the 40s, but as low as 38.  He has not had any nausea, vomiting, abdominal pain, diarrhea.   [facility] ED Course  Labs/Imaging on admission: I have personally reviewed following labs and imaging studies.   Initial vitals showed BP 123/61, pulse 43, RR 20, temp 98.4 F, SpO2 100% on room air.   Labs showed WBC 5.6, hemoglobin 13.6, platelets 199, sodium 141, potassium 4.2, bicarb 27, BUN 22, creatinine 1.15, serum glucose 151, albumin 3.9, calcium  11.4, LFTs within normal limits, proBNP 239, troponin T 38 > 35.   SARS-CoV-2, influenza, RSV PCR negative.   2 view chest x-ray negative for acute cardiopulmonary abnormality.   EDP spoke with cardiology who recommended medical admission, start IV heparin , and their team will see in consultation the hospitalist service was consulted for admission.  Subjective: Seen and examined after PPM placement, family members, son and daughter at the bedside.  Patient had no complaints.  Discharge plan discussed with them.  Brief/Interim Summary: Details  of brief hospitalization as below.  Symptomatic bradycardia: Patient with exertional dyspnea suspected related to symptomatic bradycardia.  EKG suggestive of junctional rhythm, rate 40s.  He is no longer on beta-blocker.  Cardiology and EP consulted.  TSH normal.  Patient eventually underwent PPM placement.  They have cleared him for discharge.   Paroxysmal atrial fibrillation: Was on heparin  while here.  Previously taking Eliquis .  Patient has been advised to resume Eliquis  on 01/27/2024 per cardiology recommendations.   Chronic HFpEF: Appears to be compensated.  Last EF 70-75%.  Continue home oral Lasix  20 mg daily.   Hypercalcemia: Mild  hypercalcemia noted on labs since September.  However repeat calcium  is normal.  Resume home medications.   Hyperlipidemia: Continue rosuvastatin .   OSA: Continue CPAP nightly.   Alzheimer's: He is active and functional at baseline.  Continue Aricept .  Delirium precautions.  Discharge plan was discussed with patient and/or family member/son and daughter and they verbalized understanding and agreed with it.  Discharge Diagnoses:  Principal Problem:   Bradycardia Active Problems:   Hyperlipidemia   OSA on CPAP   Chronic heart failure with preserved ejection fraction (HFpEF, >= 50%) (HCC)   Alzheimer's dementia (HCC)   Paroxysmal atrial fibrillation (HCC)    Discharge Instructions   Allergies as of 01/24/2024       Reactions   Darifenacin Other (See Comments), Anaphylaxis   PT STATES IT MAKES HIM BONKERS AND VERY TIRED Other Reaction(s): Confusion, Confusion/Altered Mental Status PT STATES IT MAKES HIM BONKERS AND VERY TIRED, Other reaction(s): Mental Status Changes (intolerance), PT STATES IT MAKES HIM BONKERS AND VERY TIRED, PT STATES IT MAKES HIM BONKERS AND VERY TIRED   Enablex [darifenacin Hydrobromide Er]    PT STATES IT MAKES HIM BONKERS AND VERY TIRED   Atorvastatin Other (See Comments)   Muscle aches/weakness Other reaction(s): Other (See Comments)  Muscle aches/weakness  Muscle aches/weakness  Muscle aches/weakness  Muscle aches/weakness  Muscle aches/weakness  Muscle aches/weakness Other reaction(s): Other (See Comments), Muscle aches/weakness, Muscle aches/weakness, Muscle aches/weakness, Muscle aches/weakness Muscle aches/weakness    Other reaction(s): Other (See Comments) Muscle aches/weakness Muscle aches/weakness Muscle aches/weakness Muscle aches/weakness Muscle aches/weakness Muscle aches/weakness    Other reaction(s): Other (See Comments), Muscle aches/weakness, Muscle aches/weakness, Muscle aches/weakness, Muscle aches/weakness    Muscle  aches/weakness  Other reaction(s): Other (See Comments) Muscle aches/weakness Muscle aches/weakness Muscle aches/weakness Muscle aches/weakness Muscle aches/weakness Muscle aches/weakness   Ceftriaxone Other (See Comments)   Unknown reaction Other reaction(s): Other (See Comments)  Unknown reaction  Unknown reaction    Unknown reaction  Unknown reaction  Unknown reaction Other reaction(s): Other (See Comments), Unknown reaction, Unknown reaction, , Unknown reaction Unknown reaction    Other reaction(s): Other (See Comments) Unknown reaction Unknown reaction  Unknown reaction Unknown reaction Unknown reaction    Other reaction(s): Other (See Comments), Unknown reaction, Unknown reaction, , Unknown reaction    Unknown reaction  Other reaction(s): Other (See Comments) Unknown reaction Unknown reaction  Unknown reaction Unknown reaction Unknown reaction   Ezetimibe Other (See Comments)   Muscle aches/weakness Other reaction(s): Other (See Comments)  Muscle aches/weakness  Muscle aches/weakness  Muscle aches/weakness Other reaction(s): Other (See Comments), Muscle aches/weakness, Muscle aches/weakness Other reaction(s): Other (See Comments) Muscle aches/weakness Muscle aches/weakness Muscle aches/weakness    Other reaction(s): Other (See Comments), Muscle aches/weakness, Muscle aches/weakness    Muscle aches/weakness  Other reaction(s): Other (See Comments) Muscle aches/weakness Muscle aches/weakness Muscle aches/weakness   Metoprolol  Rash, Dermatitis   Oxybutynin Other (See Comments)  Memory impairment, temperament changes Other Reaction(s): Confusion Other reaction(s): Mental Status Changes (intolerance), Memory impairment, temperament changes, Memory impairment, temperament changes, Memory impairment, temperament changes, , Memory impairment, temperament changes   Sulfamethoxazole Other (See Comments)   Childhood allergy Other reaction(s): Other (See Comments), Childhood allergy,  Childhood allergy   Sulfasalazine Other (See Comments)   Childhood allergy Other reaction(s): Other (See Comments), Unknown per daughter, Unknown per daughter   Levofloxacin Diarrhea, Other (See Comments)   Other Reaction(s): Mental Status Changes Caused diarheer a   Sulfonamide Derivatives Other (See Comments)   Childhood allergy        Medication List     PAUSE taking these medications    Eliquis  5 MG Tabs tablet Wait to take this until: January 27, 2024 Morning Generic drug: apixaban  TAKE 1 TABLET(5 MG) BY MOUTH TWICE DAILY       STOP taking these medications    multivitamin with minerals Tabs tablet       TAKE these medications    acetaminophen  325 MG tablet Commonly known as: TYLENOL  Take 2 tablets (650 mg total) by mouth every 6 (six) hours as needed for mild pain, fever or headache.   diclofenac  Sodium 1 % Gel Commonly known as: VOLTAREN  Apply 4 g topically 4 (four) times daily. What changed:  when to take this reasons to take this   donepezil  10 MG tablet Commonly known as: ARICEPT  Take 10 mg by mouth at bedtime.   doxycycline  100 MG capsule Commonly known as: VIBRAMYCIN  Take 100 mg by mouth 2 (two) times daily.   Fish Oil  1200 MG Caps Take 1,200 mg by mouth 2 (two) times daily.   furosemide  20 MG tablet Commonly known as: LASIX  Take one tablet daily. You may take an extra tablet for swelling, weight gain of 2 pounds overnight or 5 pounds in one week.   ketoconazole 2 % cream Commonly known as: NIZORAL Apply 1 application  topically daily as needed for irritation.   metroNIDAZOLE  1 % gel Commonly known as: METROGEL  Apply 1 Application topically daily.   mirabegron  ER 50 MG Tb24 tablet Commonly known as: MYRBETRIQ  Take 50 mg by mouth every morning.   nitrofurantoin  (macrocrystal-monohydrate) 100 MG capsule Commonly known as: MACROBID  Take 1 capsule (100 mg total) by mouth in the morning. What changed: when to take this    PRESCRIPTION MEDICATION Inhale into the lungs at bedtime. CPAP   rosuvastatin  10 MG tablet Commonly known as: CRESTOR  Take 0.5 tablets (5 mg total) by mouth every other day. What changed: additional instructions   terbinafine 1 % cream Commonly known as: LAMISIL Apply 1 Application topically daily as needed (rash).   Toviaz  8 MG Tb24 tablet Generic drug: fesoterodine  Take 8 mg by mouth daily.   triamcinolone  cream 0.1 % Commonly known as: KENALOG  Apply 1 Application topically 2 (two) times daily as needed (rash).        Follow-up Information     McClanahan, Kyra, NP Follow up in 1 week(s).   Specialty: Adult Health Nurse Practitioner Contact information: [address] [place] [address] [phone]                Allergies  Allergen Reactions   Darifenacin Other (See Comments) and Anaphylaxis    PT STATES IT MAKES HIM ANGELINE AND VERY TIRED  Other Reaction(s): Confusion, Confusion/Altered Mental Status  PT STATES IT MAKES HIM BONKERS AND VERY TIRED, Other reaction(s): Mental Status Changes (intolerance), PT STATES IT MAKES HIM ANGELINE  AND VERY TIRED, PT STATES IT MAKES HIM ANGELINE AND VERY TIRED   Enablex [Darifenacin Hydrobromide Er]     PT STATES IT MAKES HIM BONKERS AND VERY TIRED   Atorvastatin Other (See Comments)    Muscle aches/weakness  Other reaction(s): Other (See Comments)  Muscle aches/weakness  Muscle aches/weakness  Muscle aches/weakness  Muscle aches/weakness  Muscle aches/weakness  Muscle aches/weakness  Other reaction(s): Other (See Comments), Muscle aches/weakness, Muscle aches/weakness, Muscle aches/weakness, Muscle aches/weakness  Muscle aches/weakness    Other reaction(s): Other (See Comments) Muscle aches/weakness Muscle aches/weakness Muscle aches/weakness Muscle aches/weakness Muscle aches/weakness Muscle aches/weakness    Other reaction(s): Other (See Comments), Muscle aches/weakness, Muscle  aches/weakness, Muscle aches/weakness, Muscle aches/weakness    Muscle aches/weakness  Other reaction(s): Other (See Comments) Muscle aches/weakness Muscle aches/weakness Muscle aches/weakness Muscle aches/weakness Muscle aches/weakness Muscle aches/weakness   Ceftriaxone Other (See Comments)    Unknown reaction  Other reaction(s): Other (See Comments)  Unknown reaction  Unknown reaction    Unknown reaction  Unknown reaction  Unknown reaction  Other reaction(s): Other (See Comments), Unknown reaction, Unknown reaction, , Unknown reaction  Unknown reaction    Other reaction(s): Other (See Comments) Unknown reaction Unknown reaction  Unknown reaction Unknown reaction Unknown reaction    Other reaction(s): Other (See Comments), Unknown reaction, Unknown reaction, , Unknown reaction    Unknown reaction  Other reaction(s): Other (See Comments) Unknown reaction Unknown reaction  Unknown reaction Unknown reaction Unknown reaction   Ezetimibe Other (See Comments)    Muscle aches/weakness  Other reaction(s): Other (See Comments)  Muscle aches/weakness  Muscle aches/weakness  Muscle aches/weakness  Other reaction(s): Other (See Comments), Muscle aches/weakness, Muscle aches/weakness  Other reaction(s): Other (See Comments) Muscle aches/weakness Muscle aches/weakness Muscle aches/weakness    Other reaction(s): Other (See Comments), Muscle aches/weakness, Muscle aches/weakness    Muscle aches/weakness  Other reaction(s): Other (See Comments) Muscle aches/weakness Muscle aches/weakness Muscle aches/weakness   Metoprolol  Rash and Dermatitis   Oxybutynin Other (See Comments)    Memory impairment, temperament changes  Other Reaction(s): Confusion  Other reaction(s): Mental Status Changes (intolerance), Memory impairment, temperament changes, Memory impairment, temperament changes, Memory impairment, temperament changes, , Memory impairment, temperament changes   Sulfamethoxazole Other  (See Comments)    Childhood allergy  Other reaction(s): Other (See Comments), Childhood allergy, Childhood allergy   Sulfasalazine Other (See Comments)    Childhood allergy  Other reaction(s): Other (See Comments), Unknown per daughter, Unknown per daughter   Levofloxacin Diarrhea and Other (See Comments)    Other Reaction(s): Mental Status Changes  Caused diarheer a   Sulfonamide Derivatives Other (See Comments)    Childhood allergy    Consultations: Cardiology   Procedures/Studies: DG Chest 2 View Result Date: 01/23/2024 CLINICAL DATA:  shortness of breath EXAM: CHEST - 2 VIEW COMPARISON:  06/19/2023 FINDINGS: Similar subsegmental atelectasis in the left lung base. No focal airspace consolidation, pleural effusion, or pneumothorax. No cardiomegaly. Tortuous aorta with aortic atherosclerosis. No acute fracture or destructive lesions. Multilevel thoracic osteophytosis. IMPRESSION: No acute cardiopulmonary abnormality. Electronically Signed   By: Rogelia Myers M.D.   On: 01/23/2024 14:22     Discharge Exam: Vitals:   01/24/24 1012 01/24/24 1100  BP: 137/67 (!) 146/73  Pulse: 67 63  Resp: 20 20  Temp: 97.6 F (36.4 C) 97.8 F (36.6 C)  SpO2: 97% 99%   Vitals:   01/24/24 0943 01/24/24 0948 01/24/24 1012 01/24/24 1100  BP: (!) 157/74 (!) 146/65 137/67 (!) 146/73  Pulse: 65 60 67 63  Resp: (!) 24 20 20 20   Temp:   97.6 F (36.4 C) 97.8 F (36.6 C)  TempSrc:   Oral Oral  SpO2: 100% 100% 97% 99%  Weight:      Height:        General: Pt is alert, awake, not in acute distress Cardiovascular: RRR, S1/S2 +, no rubs, no gallops Respiratory: CTA bilaterally, no wheezing, no rhonchi Abdominal: Soft, NT, ND, bowel sounds + Extremities: no edema, no cyanosis    The results of significant diagnostics from this hospitalization (including imaging, microbiology, ancillary and laboratory) are listed below for reference.     Microbiology: Recent Results (from the past 240  hours)  Resp panel by RT-PCR (RSV, Flu A&B, Covid) Anterior Nasal Swab     Status: None   Collection Time: 01/23/24 12:47 PM   Specimen: Anterior Nasal Swab  Result Value Ref Range Status   SARS Coronavirus 2 by RT PCR NEGATIVE NEGATIVE Final    Comment: (NOTE) SARS-CoV-2 target nucleic acids are NOT DETECTED.  The SARS-CoV-2 RNA is generally detectable in upper respiratory specimens during the acute phase of infection. The lowest concentration of SARS-CoV-2 viral copies this assay can detect is 138 copies/mL. A negative result does not preclude SARS-Cov-2 infection and should not be used as the sole basis for treatment or other patient management decisions. A negative result may occur with  improper specimen collection/handling, submission of specimen other than nasopharyngeal swab, presence of viral mutation(s) within the areas targeted by this assay, and inadequate number of viral copies(<138 copies/mL). A negative result must be combined with clinical observations, patient history, and epidemiological information. The expected result is Negative.  Fact Sheet for Patients:  bloggercourse.com  Fact Sheet for Healthcare Providers:  seriousbroker.it  This test is no t yet approved or cleared by the [place]  FDA and  has been authorized for detection and/or diagnosis of SARS-CoV-2 by FDA under an Emergency Use Authorization (EUA). This EUA will remain  in effect (meaning this test can be used) for the duration of the COVID-19 declaration under Section 564(b)(1) of the Act, 21 U.S.C.section 360bbb-3(b)(1), unless the authorization is terminated  or revoked sooner.       Influenza A by PCR NEGATIVE NEGATIVE Final   Influenza B by PCR NEGATIVE NEGATIVE Final    Comment: (NOTE) The Xpert Xpress SARS-CoV-2/FLU/RSV plus assay is intended as an aid in the diagnosis of influenza from Nasopharyngeal swab specimens and should not  be used as a sole basis for treatment. Nasal washings and aspirates are unacceptable for Xpert Xpress SARS-CoV-2/FLU/RSV testing.  Fact Sheet for Patients: bloggercourse.com  Fact Sheet for Healthcare Providers: seriousbroker.it  This test is not yet approved or cleared by the [place]  FDA and has been authorized for detection and/or diagnosis of SARS-CoV-2 by FDA under an Emergency Use Authorization (EUA). This EUA will remain in effect (meaning this test can be used) for the duration of the COVID-19 declaration under Section 564(b)(1) of the Act, 21 U.S.C. section 360bbb-3(b)(1), unless the authorization is terminated or revoked.     Resp Syncytial Virus by PCR NEGATIVE NEGATIVE Final    Comment: (NOTE) Fact Sheet for Patients: bloggercourse.com  Fact Sheet for Healthcare Providers: seriousbroker.it  This test is not yet approved or cleared by the [place]  FDA and has been authorized for detection and/or diagnosis of SARS-CoV-2 by FDA under an Emergency Use Authorization (EUA). This EUA will remain in effect (meaning this test can be used) for the duration of  the COVID-19 declaration under Section 564(b)(1) of the Act, 21 U.S.C. section 360bbb-3(b)(1), unless the authorization is terminated or revoked.  Performed at [facility], [address], [place], [place] [address]   Surgical PCR screen     Status: None   Collection Time: 01/24/24  7:18 AM   Specimen: Nasal Mucosa; Nasal Swab  Result Value Ref Range Status   MRSA, PCR NEGATIVE NEGATIVE Final   Staphylococcus aureus NEGATIVE NEGATIVE Final    Comment: (NOTE) The Xpert SA Assay (FDA approved for NASAL specimens in patients 83 years of age and older), is one component of a comprehensive surveillance program. It is not intended to diagnose infection nor to guide or monitor  treatment. Performed at [facility], [address] [place], [place] [address]      Labs: BNP (last 3 results) Recent Labs    03/17/23 1145 06/19/23 1130 06/27/23 1612  BNP 86.4 227.1* 89.2   Basic Metabolic Panel: Recent Labs  Lab 01/23/24 1247 01/23/24 1904 01/24/24 0538  NA 141  --  141  K 4.2  --  4.1  CL 106  --  106  CO2 27  --  26  GLUCOSE 151*  --  122*  BUN 22  --  23  CREATININE 1.15  --  1.28*  CALCIUM  11.4*  --  10.3  MG  --  2.2  --    Liver Function Tests: Recent Labs  Lab 01/23/24 1247  AST 24  ALT 24  ALKPHOS 73  BILITOT 0.6  PROT 6.6  ALBUMIN 3.9   No results for input(s): LIPASE, AMYLASE in the last 168 hours. No results for input(s): AMMONIA in the last 168 hours. CBC: Recent Labs  Lab 01/23/24 1247 01/24/24 0538  WBC 5.6 5.9  NEUTROABS 3.7  --   HGB 13.6 13.7  HCT 40.6 40.4  MCV 94.2 93.5  PLT 199 211   Cardiac Enzymes: No results for input(s): CKTOTAL, CKMB, CKMBINDEX, TROPONINI in the last 168 hours. BNP: Invalid input(s): POCBNP CBG: No results for input(s): GLUCAP in the last 168 hours. D-Dimer No results for input(s): DDIMER in the last 72 hours. Hgb A1c No results for input(s): HGBA1C in the last 72 hours. Lipid Profile No results for input(s): CHOL, HDL, LDLCALC, TRIG, CHOLHDL, LDLDIRECT in the last 72 hours. Thyroid  function studies Recent Labs    01/23/24 1904  TSH 2.433   Anemia work up No results for input(s): VITAMINB12, FOLATE, FERRITIN, TIBC, IRON, RETICCTPCT in the last 72 hours. Urinalysis    Component Value Date/Time   COLORURINE YELLOW 04/02/2021 0222   APPEARANCEUR CLEAR 04/02/2021 0222   LABSPEC 1.025 04/02/2021 0222   PHURINE 5.5 04/02/2021 0222   GLUCOSEU NEGATIVE 04/02/2021 0222   HGBUR NEGATIVE 04/02/2021 0222   BILIRUBINUR NEGATIVE 04/02/2021 0222   KETONESUR NEGATIVE 04/02/2021 0222   PROTEINUR NEGATIVE 04/02/2021 0222   NITRITE  NEGATIVE 04/02/2021 0222   LEUKOCYTESUR TRACE (A) 04/02/2021 0222   Sepsis Labs Recent Labs  Lab 01/23/24 1247 01/24/24 0538  WBC 5.6 5.9   Microbiology Recent Results (from the past 240 hours)  Resp panel by RT-PCR (RSV, Flu A&B, Covid) Anterior Nasal Swab     Status: None   Collection Time: 01/23/24 12:47 PM   Specimen: Anterior Nasal Swab  Result Value Ref Range Status   SARS Coronavirus 2 by RT PCR NEGATIVE NEGATIVE Final    Comment: (NOTE) SARS-CoV-2 target nucleic acids are NOT DETECTED.  The SARS-CoV-2 RNA is  generally detectable in upper respiratory specimens during the acute phase of infection. The lowest concentration of SARS-CoV-2 viral copies this assay can detect is 138 copies/mL. A negative result does not preclude SARS-Cov-2 infection and should not be used as the sole basis for treatment or other patient management decisions. A negative result may occur with  improper specimen collection/handling, submission of specimen other than nasopharyngeal swab, presence of viral mutation(s) within the areas targeted by this assay, and inadequate number of viral copies(<138 copies/mL). A negative result must be combined with clinical observations, patient history, and epidemiological information. The expected result is Negative.  Fact Sheet for Patients:  bloggercourse.com  Fact Sheet for Healthcare Providers:  seriousbroker.it  This test is no t yet approved or cleared by the [place]  FDA and  has been authorized for detection and/or diagnosis of SARS-CoV-2 by FDA under an Emergency Use Authorization (EUA). This EUA will remain  in effect (meaning this test can be used) for the duration of the COVID-19 declaration under Section 564(b)(1) of the Act, 21 U.S.C.section 360bbb-3(b)(1), unless the authorization is terminated  or revoked sooner.       Influenza A by PCR NEGATIVE NEGATIVE Final   Influenza B by PCR  NEGATIVE NEGATIVE Final    Comment: (NOTE) The Xpert Xpress SARS-CoV-2/FLU/RSV plus assay is intended as an aid in the diagnosis of influenza from Nasopharyngeal swab specimens and should not be used as a sole basis for treatment. Nasal washings and aspirates are unacceptable for Xpert Xpress SARS-CoV-2/FLU/RSV testing.  Fact Sheet for Patients: bloggercourse.com  Fact Sheet for Healthcare Providers: seriousbroker.it  This test is not yet approved or cleared by the [place]  FDA and has been authorized for detection and/or diagnosis of SARS-CoV-2 by FDA under an Emergency Use Authorization (EUA). This EUA will remain in effect (meaning this test can be used) for the duration of the COVID-19 declaration under Section 564(b)(1) of the Act, 21 U.S.C. section 360bbb-3(b)(1), unless the authorization is terminated or revoked.     Resp Syncytial Virus by PCR NEGATIVE NEGATIVE Final    Comment: (NOTE) Fact Sheet for Patients: bloggercourse.com  Fact Sheet for Healthcare Providers: seriousbroker.it  This test is not yet approved or cleared by the [place]  FDA and has been authorized for detection and/or diagnosis of SARS-CoV-2 by FDA under an Emergency Use Authorization (EUA). This EUA will remain in effect (meaning this test can be used) for the duration of the COVID-19 declaration under Section 564(b)(1) of the Act, 21 U.S.C. section 360bbb-3(b)(1), unless the authorization is terminated or revoked.  Performed at [employer], [address], [place], [place] [address]   Surgical PCR screen     Status: None   Collection Time: 01/24/24  7:18 AM   Specimen: Nasal Mucosa; Nasal Swab  Result Value Ref Range Status   MRSA, PCR NEGATIVE NEGATIVE Final   Staphylococcus aureus NEGATIVE NEGATIVE Final    Comment: (NOTE) The Xpert SA Assay (FDA approved for  NASAL specimens in patients 72 years of age and older), is one component of a comprehensive surveillance program. It is not intended to diagnose infection nor to guide or monitor treatment. Performed at [facility], [address], [place], [place] [address]     FURTHER DISCHARGE INSTRUCTIONS:   Get Medicines reviewed and adjusted: Please take all your medications with you for your next visit with your Primary MD   Laboratory/radiological data: Please request your Primary MD to go over all hospital tests and  procedure/radiological results at the follow up, please ask your Primary MD to get all Hospital records sent to his/her office.   In some cases, they will be blood work, cultures and biopsy results pending at the time of your discharge. Please request that your primary care M.D. goes through all the records of your hospital data and follows up on these results.   Also Note the following: If you experience worsening of your admission symptoms, develop shortness of breath, life threatening emergency, suicidal or homicidal thoughts you must seek medical attention immediately by calling 911 or calling your MD immediately  if symptoms less severe.   You must read complete instructions/literature along with all the possible adverse reactions/side effects for all the Medicines you take and that have been prescribed to you. Take any new Medicines after you have completely understood and accpet all the possible adverse reactions/side effects.    patient was instructed, not to drive, operate heavy machinery, perform activities at heights, swimming or participation in water activities or provide baby-sitting services while on Pain, Sleep and Anxiety Medications; until their outpatient Physician has advised to do so again. Also recommended to not to take more than prescribed Pain, Sleep and Anxiety Medications.  It is not advisable to combine anxiety, sleep and pain medications without talking  with your primary care provider.     Wear Seat belts while driving.   Please note: You were cared for by a hospitalist during your hospital stay. Once you are discharged, your primary care physician will handle any further medical issues. Please note that NO REFILLS for any discharge medications will be authorized once you are discharged, as it is imperative that you return to your primary care physician (or establish a relationship with a primary care physician if you do not have one) for your post hospital discharge needs so that they can reassess your need for medications and monitor your lab values  Time coordinating discharge: Over 30 minutes  SIGNED:   Fredia Skeeter, MD  [facility] 01/24/2024, 1:42 PM *Please note that this is a verbal dictation therefore any spelling or grammatical errors are due to the Dragon Medical One system interpretation. If 7PM-7AM, please contact night-coverage [email or web address]

## 2024-01-24 NOTE — TOC Transition Note (Signed)
 Transition of Care [facility]) - Discharge Note   Patient Details  Name: [name] [name] MRN: [id number] Date of Birth: 02-18-1941  Transition of [facility]) CM/SW Contact:  Roxie KANDICE Stain, RN Phone Number: 01/24/2024, 2:41 PM   Clinical Narrative:    [name] is stable to discharge home. Follow up apt on AVS. No ICM (Inpatient Care Management) needs at this time.    Final next level of care: Home/Self Care Barriers to Discharge: Barriers Resolved   Patient Goals and CMS Choice Patient states their goals for this hospitalization and ongoing recovery are:: return home          Discharge Placement               Home        Discharge Plan and Services Additional resources added to the After Visit Summary for                                       Social Drivers of Health (SDOH) Interventions SDOH Screenings   Food Insecurity: No Food Insecurity (01/23/2024)  Housing: Low Risk  (01/23/2024)  Transportation Needs: No Transportation Needs (01/23/2024)  Utilities: Not At Risk (01/23/2024)  Depression (PHQ2-9): Low Risk  (08/10/2020)  Financial Resource Strain: Low Risk  (10/11/2023)   Received from [facility]  Physical Activity: Inactive (10/11/2023)   Received from [facility]  Social Connections: Moderately Integrated (01/23/2024)  Stress: No Stress Concern Present (10/11/2023)   Received from [facility]  Tobacco Use: Medium Risk (01/24/2024)     Readmission Risk Interventions     No data to display

## 2024-01-24 NOTE — Consult Note (Signed)
 ELECTROPHYSIOLOGY CONSULT NOTE    Patient ID: [name] [name] MRN: [id number], DOB/AGE: 04-03-1940 83 y.o.  Admit date: 01/23/2024 Date of Consult: 01/24/2024  Primary Physician: McClanahan, Kyra, NP Primary Cardiologist: Peter Jordan, MD  Electrophysiologist: New   Referring Provider: Dr. Lonni  Patient Profile: [name] [name] is a 83 y.o. male with a history of alzheimers, PAF, CAD, HTN, and bradycardia who is being seen today for the evaluation of junctional rhythm at the request of Dr. Francina.  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  EKG in ER revealed heart rates in the 40s.  Appears to be junctional rhythm with underlying left bundle branch block.   Pt is awake and alert to person and place this am. Spoke with daughter on phone who says he is fairly functional with ADLs. Needs assistance with food, some with bathing. Otherwise gets around the house fine. Has previously had junctional bradycardia on monitoring that was symptomatic, BB eventually stopped. Has been having more and more episodes of fatigue and SOB, correlating with HRs in 40s on pulse ox at home.   Labs Potassium4.1 (11/26 9461) Magnesium   2.2 (11/25 1904) Creatinine, ser  1.28* (11/26 0538) PLT  211 (11/26 0538) HGB  13.7 (11/26 0538) WBC 5.9 (11/26 0538)  .    Past Medical History:  Diagnosis Date   Alzheimer disease (HCC)    Chronic kidney disease    mild insuffiency   Coronary artery disease    LHC 4/10: Mid LAD 40-50%, then 70%, OM1 20-30%, EF 65%. Mid LAD FFR 0.89 (not hemodynamically significant). Medical therapy was continued.   Dysuria  08/10/2020   Hyperlipidemia    LBBB (left bundle branch block) 06/2018   Obstructive sleep apnea      Surgical History:  Past Surgical History:  Procedure Laterality Date   CARDIAC CATHETERIZATION  05/02/2003   single vessel,moderate stenosis mid LAD   CARDIAC CATHETERIZATION  06/12/2008   continue med. therapy   right shoulder surgery     TOTAL HIP ARTHROPLASTY Left 09/14/2018   Procedure: LEFT TOTAL HIP ARTHROPLASTY ANTERIOR APPROACH;  Surgeon: Vernetta Lonni GRADE, MD;  Location: [facility] ORS;  Service: Orthopedics;  Laterality: Left;     Medications Prior to Admission  Medication Sig Dispense Refill Last Dose/Taking   acetaminophen  (TYLENOL ) 325 MG tablet Take 2 tablets (650 mg total) by mouth every 6 (six) hours as needed for mild pain, fever or headache.   01/23/2024 Morning   apixaban  (ELIQUIS ) 5 MG TABS tablet TAKE 1 TABLET(5 MG) BY MOUTH TWICE DAILY 180 tablet 1 01/23/2024 at  8:00 AM   diclofenac  Sodium (VOLTAREN ) 1 % GEL Apply 4 g topically 4 (four) times daily. (Patient taking differently: Apply 4 g topically daily as needed (for pain).) 50 g 0 Past Week   donepezil  (ARICEPT ) 10 MG tablet Take 10 mg by mouth at bedtime.   01/22/2024 Bedtime   doxycycline  (VIBRAMYCIN ) 100 MG capsule Take 100 mg by mouth 2 (two) times daily.   01/23/2024 Morning   fesoterodine  (TOVIAZ ) 8 MG TB24 tablet Take 8 mg by mouth daily.   01/23/2024 Morning   furosemide  (LASIX ) 20 MG tablet Take one tablet daily. You may take an extra tablet for  swelling, weight gain of 2 pounds overnight or 5 pounds in one week.   01/23/2024 Morning   ketoconazole (NIZORAL) 2 % cream Apply 1 application  topically daily as needed for irritation.   Past Week   metroNIDAZOLE  (METROGEL ) 1 % gel Apply 1 Application topically daily.   Past Week   mirabegron  ER (MYRBETRIQ ) 50 MG TB24 tablet Take 50 mg by mouth every morning.   01/23/2024 Morning   nitrofurantoin , macrocrystal-monohydrate, (MACROBID ) 100 MG capsule Take 1 capsule  (100 mg total) by mouth in the morning. (Patient taking differently: Take 100 mg by mouth every evening.)   01/22/2024 Evening   Omega-3 Fatty Acids  (FISH OIL ) 1200 MG CAPS Take 1,200 mg by mouth 2 (two) times daily.   01/23/2024 Morning   PRESCRIPTION MEDICATION Inhale into the lungs at bedtime. CPAP   01/22/2024 Evening   rosuvastatin  (CRESTOR ) 10 MG tablet Take 0.5 tablets (5 mg total) by mouth every other day. (Patient taking differently: Take 5 mg by mouth every other day. Mon wed and Fridays) 45 tablet 1 01/22/2024 Morning   terbinafine (LAMISIL) 1 % cream Apply 1 Application topically daily as needed (rash).   Past Week   triamcinolone  cream (KENALOG ) 0.1 % Apply 1 Application topically 2 (two) times daily as needed (rash).   Past Week   Multiple Vitamin (MULTIVITAMIN WITH MINERALS) TABS tablet Take 1 tablet by mouth every morning. (Patient not taking: Reported on 01/23/2024)   Not Taking    Inpatient Medications:   chlorhexidine   60 mL Topical Once   chlorhexidine   60 mL Topical Once   donepezil   10 mg Oral QHS   furosemide   20 mg Oral Daily   gentamicin  (GARAMYCIN ) 80 mg in sodium chloride  0.9 % 500 mL irrigation  80 mg Irrigation On Call   rosuvastatin   5 mg Oral QODAY   sodium chloride  flush  3 mL Intravenous Q12H   sodium chloride  flush  3 mL Intravenous Q12H    Allergies:  Allergies  Allergen Reactions   Darifenacin Other (See Comments) and Anaphylaxis    PT STATES IT MAKES HIM BONKERS AND VERY TIRED  Other Reaction(s): Confusion, Confusion/Altered Mental Status  PT STATES IT MAKES HIM BONKERS AND VERY TIRED, Other reaction(s): Mental Status Changes (intolerance), PT STATES IT MAKES HIM BONKERS AND VERY TIRED, PT STATES IT MAKES HIM BONKERS AND VERY TIRED   Enablex [Darifenacin Hydrobromide Er]     PT STATES IT MAKES HIM BONKERS AND VERY TIRED   Atorvastatin Other (See Comments)    Muscle aches/weakness  Other reaction(s): Other (See Comments)  Muscle  aches/weakness  Muscle aches/weakness  Muscle aches/weakness  Muscle aches/weakness  Muscle aches/weakness  Muscle aches/weakness  Other reaction(s): Other (See Comments), Muscle aches/weakness, Muscle aches/weakness, Muscle aches/weakness, Muscle aches/weakness  Muscle aches/weakness    Other reaction(s): Other (See Comments) Muscle aches/weakness Muscle aches/weakness Muscle aches/weakness Muscle aches/weakness Muscle aches/weakness Muscle aches/weakness    Other reaction(s): Other (See Comments), Muscle aches/weakness, Muscle aches/weakness, Muscle aches/weakness, Muscle aches/weakness    Muscle aches/weakness  Other reaction(s): Other (See Comments) Muscle aches/weakness Muscle aches/weakness Muscle aches/weakness Muscle aches/weakness Muscle aches/weakness Muscle aches/weakness   Ceftriaxone Other (See Comments)    Unknown reaction  Other reaction(s): Other (See Comments)  Unknown reaction  Unknown reaction    Unknown reaction  Unknown reaction  Unknown reaction  Other reaction(s): Other (See Comments), Unknown reaction, Unknown reaction, , Unknown reaction  Unknown reaction    Other reaction(s): Other (See Comments) Unknown reaction Unknown reaction  Unknown reaction Unknown reaction Unknown reaction    Other reaction(s): Other (See Comments), Unknown reaction, Unknown reaction, , Unknown reaction    Unknown reaction  Other reaction(s): Other (See Comments) Unknown reaction Unknown reaction  Unknown reaction Unknown reaction Unknown reaction   Ezetimibe Other (See Comments)    Muscle aches/weakness  Other reaction(s): Other (See Comments)  Muscle aches/weakness  Muscle aches/weakness  Muscle aches/weakness  Other reaction(s): Other (See Comments), Muscle aches/weakness, Muscle aches/weakness  Other reaction(s): Other (See Comments) Muscle aches/weakness Muscle aches/weakness Muscle aches/weakness    Other reaction(s): Other (See Comments), Muscle aches/weakness,  Muscle aches/weakness    Muscle aches/weakness  Other reaction(s): Other (See Comments) Muscle aches/weakness Muscle aches/weakness Muscle aches/weakness   Metoprolol  Rash and Dermatitis   Oxybutynin Other (See Comments)    Memory impairment, temperament changes  Other Reaction(s): Confusion  Other reaction(s): Mental Status Changes (intolerance), Memory impairment, temperament changes, Memory impairment, temperament changes, Memory impairment, temperament changes, , Memory impairment, temperament changes   Sulfamethoxazole Other (See Comments)    Childhood allergy  Other reaction(s): Other (See Comments), Childhood allergy, Childhood allergy   Sulfasalazine Other (See Comments)    Childhood allergy  Other reaction(s): Other (See Comments), Unknown per daughter, Unknown per daughter   Levofloxacin Diarrhea and Other (See Comments)    Other Reaction(s): Mental Status Changes  Caused diarheer a   Sulfonamide Derivatives Other (See Comments)    Childhood allergy    Family History  Problem Relation Age of Onset   Cancer Mother    Hypertension Mother    Other Father        old age   Hypertension Father      Physical Exam: Vitals:   01/23/24 1900 01/23/24 2300 01/24/24 0100 01/24/24 0355  BP: 128/86 (!) 121/52  103/86  Pulse: (!) 43 (!) 49 (!) 36 (!) 42  Resp:  16  19  Temp:  97.8 F (36.6 C)  97.8 F (36.6 C)  TempSrc:  Tympanic  Oral  SpO2: 99% 95% 98% 98%  Weight: 83.8 kg   85.4 kg  Height: 5' 7 (1.702 m)   5' 7 (1.702 m)    GEN- NAD, A&O x 3, normal affect HEENT: Normocephalic, atraumatic Lungs- CTAB, Normal effort.  Heart- Slow but regular rate and rhythm, No M/G/R.  GI- Soft, NT, ND.  Extremities- No clubbing, cyanosis, or edema   Radiology/Studies: DG Chest 2 View Result Date: 01/23/2024 CLINICAL DATA:  shortness of breath EXAM: CHEST - 2 VIEW COMPARISON:  06/19/2023 FINDINGS: Similar subsegmental atelectasis in the left lung base. No focal airspace  consolidation, pleural effusion, or pneumothorax. No cardiomegaly. Tortuous aorta with aortic atherosclerosis. No acute fracture or destructive lesions. Multilevel thoracic osteophytosis. IMPRESSION: No acute cardiopulmonary abnormality. Electronically Signed   By: Rogelia Myers M.D.   On: 01/23/2024 14:22    ZXH:glwrupnwjo rhythm in the 40s (personally reviewed)  TELEMETRY: junctional rhythm 35-42 bpm (personally reviewed)  Assessment/Plan:  Symptomatic brady Tachy-brady Junctional rhythm No reversible cause, having previously stopped BB Explained risks, benefits, and alternatives to PPM implantation, including but not limited to bleeding, infection, pneumothorax, pericardial effusion, lead dislodgement, heart attack, stroke, or death.  Pt verbalized understanding and agrees to proceed.    OAC held   Paroxysmal AF Eliquis  held.   Alzheimers Fairly functional physically.  Will need family to sign consent.   For questions or updates, please contact [name] [name] Please consult [email or web address] for contact info under     Signed, Ozell Prentice Passey, PA-C  01/24/2024, 7:17 AM

## 2024-01-25 LAB — PTH, INTACT AND CALCIUM
Calcium, Total (PTH): 11 mg/dL — ABNORMAL HIGH (ref 8.6–10.2)
PTH: 63 pg/mL (ref 15–65)

## 2024-01-28 NOTE — Op Note (Signed)
 Procedure:  LEFT sided MDT Dual Chamber PPM Implant  Pre-op Diagnosis: sx bradycardia, 2:1 AVB, high degree AVBB    Post-op Diagnosis: same  Procedure Date: 01/24/24  Attending: Adina Primus, MD   Anesthesia: MAC  Procedure Report:  The LEFT shoulder was prepped with chlorhexidine  scrub. 30 cc lidocaine  was injected at the planned incision site. A pocket was created with electrocautery and blunt dissection.    No venogram was performed. The LEFT axillary vein was accessed using standard access needle under ultrasound guidance. A wire was passed into the IVC. The access process was repeated once and a second wire was advanced into the IVC.  A 7 Fr sheath was placed over the second wire. The RA lead was subsequently advanced over a straight stylet into the RV apical septum for temporary pacing support.   A 9 Fr sheath was advanced and the dilator removed. Through this short sheath, a C315 sheath was advanced to the basal RV septum.   The RV lead was inserted into the sheath and the lead was then advanced to the basal RV septum.   The site for deployment was identified based on RAO and LAO fluoroscopy and response to pacing. With RV endocardial pacing, we observed a W pattern in V1 and aVR/aVL discordance. The body of the 3830 lead was rotated in a clockwise fashion and it was advanced into the RV septum.   The paced morphology demonstrated qR in V1.  The LVAT was < 80 ms as measured  from onset of stim to peak R wave in V6. The above findings were most consistent with selective LB capture  The sheath was split and removed and the RV lead was firmly attached to the pre-pectoralis fascia with three 0-silk sutures.   A RA lead was retracted from the RV and was subsequently advanced over a straight stylet into the inferior RA. A curved stylet was then used to affix the RA lead in the right atrial appendage. The active fixation screw was deployed, and interrogation revealed a current of  injury with acceptable lead parameters (see below). The peel away sheath was removed and the lead was affixed to the fascia using two 0-silk sutures.   The device was then connected to the leads. The pocket was irrigated with gentamicin  solution. The device was affixed to the underlying fascia with a single 0-silk suture. The pocket was closed with continuous 2-0 V-Loc and 3-0 V-Loc. Dermabond was used to close the superficial layer. A vaso-occlusive dressing was placed.    The device was evaluated by the representative of the cardiac device manufacturer under the supervision of the attending physician with implant parameters listed below.   Complications: none Estimated blood loss: less than 50 mL  Implanted Hardware: Implant Name Type Inv. Item Serial No. Manufacturer Lot No. LRB No. Used Action  LEAD CAPSURE NOVUS 5076-52CM - [id number] Lead LEAD CAPSURE NOVUS 5076-52CM [id number] [employer]  N/A 1 Implanted  LEAD SELECT SECURE 3830 [id number] - [id number] Lead LEAD SELECT SECURE 3830 [id number] [employer]  N/A 1 Implanted  IPG PACE AZUR XT DR MRI T8IM98 - [id number] Pacemaker IPG PACE AZUR XT DR MRI T8IM98 [id number] [employer]  N/A 1 Implanted   Device Information: PPM: MDT Azure XT DR MRI [id number], SN [id number], DOI 01/24/24 RA:  MDT 5076 CapsureFix Novus MRI, SN [id number], DOI 01/24/24 RV/LBaP: MDT 6169 [employer]ure, SN [id number], DOI 01/24/24  Lead Interrogation Data: RA: 1.8 mV / 513 ohms /  0.75 V @ 0.4 ms RV/LBaP: n/a (no escape) mV / 779 ohms / 0.75 V @ 0.4 ms  Summary: 1. Successful implant of a LEFT sided MDT DC/LBBAP PPM with selective LB capture  Recommendations: Routine post-procedure care with bedrest for 3 hours No heparin  (IV or subcutaneous) for 48 hours. No enoxaparin  (IV or subcutaneous) for 7 days.  Resume apixaban  Saturday (11/29) AM  PA/lateral CXR same day  Wound check same day Device  interrogation same day Same day discharge if all parameters stable   [name] DELENA Primus, MD [facility]  Cardiac Electrophysiology

## 2024-01-29 ENCOUNTER — Encounter (HOSPITAL_COMMUNITY): Payer: Self-pay | Admitting: Student in an Organized Health Care Education/Training Program

## 2024-01-29 ENCOUNTER — Telehealth: Payer: Self-pay | Admitting: Student in an Organized Health Care Education/Training Program

## 2024-01-29 ENCOUNTER — Telehealth: Payer: Self-pay

## 2024-01-29 NOTE — Telephone Encounter (Signed)
 I spoke with the patient's daughter, [name] [name]. She advised the patient was in very good spirits after his PPM implant on 01/24/24 with Dr. Almetta. Once he returned home she noticed that he was having some lower extremity swelling, but his weight has since gone back down from 191 lbs >> 185 lbs.  However, the patient has a significant amount of swelling in the left arm with c/o pain in the left hand.  He denies pain directly at the implant site and no increased swelling since implant.  The patient did resume his eliquis  on 01/27/24.,  Myles [name] [name] I would review with the provider and reach back out to her with further MD recommendations.  Unsure is a venous ultrasound is warranted since the patient has resumed anticoagulation.   [name] voices understanding and is agreeable.

## 2024-01-29 NOTE — Telephone Encounter (Signed)
 Daughter called in stating that pt is not feeling well he has a lot of swelling and is concerned. Daughter thinks the pt needs to be seen

## 2024-01-29 NOTE — Telephone Encounter (Signed)
 Spoke to [place]. Patient with some swelling down arm and stiffness. Denies any erythema, swelling at incision site, no fevers, no drainage. Potentially DVT, resumed apixaban  Saturday. She is concerned it is from him restricting use. Ok to increase activity and arm use. Although some risk for lead dislodgement I would rather him increase his L arm movement. If not getting better by end of week then can get him seen back in clinic and potentially get US . Right now US  wouldn't change management since back on Eliquis .

## 2024-01-29 NOTE — Telephone Encounter (Signed)
 Spoke with Dr. Elige via Secure chat and advised of the patient left arm swelling and pain in the hand since PPM implant.  MD aware that the patient resumed his eliquis  on 01/27/24. Per Dr. Almetta no further testing needed at this time due to the resumption of OAC.  MD advised he will reach out to the patient's daughter and let me know if anything further is needed.

## 2024-01-31 ENCOUNTER — Telehealth: Payer: Self-pay

## 2024-01-31 NOTE — Telephone Encounter (Signed)
 Follow-up after same day discharge: Implant date: 01/24/2024 MD: MCA Device: PPM Location: L chest    Wound check visit: 02/07/2024 90 day MD follow-up: 05/06/2024  Remote Transmission received:yes  Dressing/sling removed: yes  Confirm OAC restart on: yes  Please continue to monitor your cardiac device site for redness, swelling, and drainage. Call the device clinic at [phone] if you experience these symptoms, fever/chills, or have questions about your device.   Remote monitoring is used to monitor your cardiac device from home. This monitoring is scheduled every 91 days by our office. It allows us  to keep an eye on the functioning of your device to ensure it is working properly.

## 2024-02-03 ENCOUNTER — Encounter: Payer: Self-pay | Admitting: Cardiology

## 2024-02-05 NOTE — Telephone Encounter (Signed)
 Pt sent 2 encounters. Closing this one as to not cause confusion. Device aware of multiple messages

## 2024-02-07 ENCOUNTER — Ambulatory Visit: Attending: Cardiology | Admitting: *Deleted

## 2024-02-07 DIAGNOSIS — I480 Paroxysmal atrial fibrillation: Secondary | ICD-10-CM | POA: Diagnosis present

## 2024-02-07 DIAGNOSIS — I442 Atrioventricular block, complete: Secondary | ICD-10-CM | POA: Insufficient documentation

## 2024-02-07 LAB — CUP PACEART INCLINIC DEVICE CHECK
Date Time Interrogation Session: 20251210170658
Implantable Lead Connection Status: 753985
Implantable Lead Connection Status: 753985
Implantable Lead Implant Date: 20251126
Implantable Lead Implant Date: 20251126
Implantable Lead Location: 753858
Implantable Lead Location: 753859
Implantable Lead Model: 3830
Implantable Lead Model: 5076
Implantable Pulse Generator Implant Date: 20251126

## 2024-02-07 NOTE — Patient Instructions (Signed)
   After Your Pacemaker   Monitor your pacemaker site for redness, swelling, and drainage. Call the device clinic at [phone] if you experience these symptoms or fever/chills.  Your incision was closed with Dermabond:  You may shower 1 day after your defibrillator implant and wash your incision with soap and water. Avoid lotions, ointments, or perfumes over your incision until it is well-healed.  You may use a hot tub or a pool after your wound check appointment if the incision is completely closed.  Do not lift, push or pull greater than 10 pounds with the affected arm until 6 weeks after your procedure. There are no other restrictions in arm movement after your wound check appointment.  You may drive, unless driving has been restricted by your healthcare providers.  Remote monitoring is used to monitor your pacemaker from home. This monitoring is scheduled every 91 days by our office. It allows [place] to keep an eye on the functioning of your device to ensure it is working properly. You will routinely see your Electrophysiologist annually (more often if necessary).

## 2024-02-07 NOTE — Progress Notes (Signed)
 Normal dual chamber pacemaker wound check. Presenting rhythm: AS/VP. Wound well healed. Routine testing performed. Thresholds, sensing, and impedance consistent with implant measurements and at 3.5V safety margin/auto capture until 3 month visit. No episodes. Reviewed arm restrictions to continue for 6 weeks total post op.  Pt enrolled in remote follow-up. Patient had good underlying intrinsic conduction today during sense test. Might want to consider pulling out AV delays at 90 day post op follow up to allow for intrinsic conduction and decrease V pacing percentage.

## 2024-02-08 ENCOUNTER — Ambulatory Visit: Payer: Self-pay | Admitting: Student in an Organized Health Care Education/Training Program

## 2024-03-06 ENCOUNTER — Ambulatory Visit: Attending: Student in an Organized Health Care Education/Training Program

## 2024-03-06 DIAGNOSIS — I480 Paroxysmal atrial fibrillation: Secondary | ICD-10-CM

## 2024-03-07 ENCOUNTER — Emergency Department (HOSPITAL_BASED_OUTPATIENT_CLINIC_OR_DEPARTMENT_OTHER)
Admission: EM | Admit: 2024-03-07 | Discharge: 2024-03-08 | Disposition: A | Discharge status: Home / Self Care | Attending: Emergency Medicine | Admitting: Emergency Medicine

## 2024-03-07 ENCOUNTER — Emergency Department (HOSPITAL_BASED_OUTPATIENT_CLINIC_OR_DEPARTMENT_OTHER)

## 2024-03-07 ENCOUNTER — Encounter (HOSPITAL_BASED_OUTPATIENT_CLINIC_OR_DEPARTMENT_OTHER): Payer: Self-pay | Admitting: Emergency Medicine

## 2024-03-07 ENCOUNTER — Other Ambulatory Visit: Payer: Self-pay

## 2024-03-07 DIAGNOSIS — Y9301 Activity, walking, marching and hiking: Secondary | ICD-10-CM | POA: Diagnosis not present

## 2024-03-07 DIAGNOSIS — M542 Cervicalgia: Secondary | ICD-10-CM | POA: Insufficient documentation

## 2024-03-07 DIAGNOSIS — S51011A Laceration without foreign body of right elbow, initial encounter: Secondary | ICD-10-CM | POA: Insufficient documentation

## 2024-03-07 DIAGNOSIS — S40021A Contusion of right upper arm, initial encounter: Secondary | ICD-10-CM | POA: Diagnosis not present

## 2024-03-07 DIAGNOSIS — I480 Paroxysmal atrial fibrillation: Secondary | ICD-10-CM | POA: Diagnosis not present

## 2024-03-07 DIAGNOSIS — S80211A Abrasion, right knee, initial encounter: Secondary | ICD-10-CM | POA: Insufficient documentation

## 2024-03-07 DIAGNOSIS — Z950 Presence of cardiac pacemaker: Secondary | ICD-10-CM | POA: Insufficient documentation

## 2024-03-07 DIAGNOSIS — S065X0A Traumatic subdural hemorrhage without loss of consciousness, initial encounter: Secondary | ICD-10-CM | POA: Insufficient documentation

## 2024-03-07 DIAGNOSIS — S0081XA Abrasion of other part of head, initial encounter: Secondary | ICD-10-CM | POA: Insufficient documentation

## 2024-03-07 DIAGNOSIS — S065XAA Traumatic subdural hemorrhage with loss of consciousness status unknown, initial encounter: Secondary | ICD-10-CM

## 2024-03-07 DIAGNOSIS — W19XXXA Unspecified fall, initial encounter: Secondary | ICD-10-CM

## 2024-03-07 DIAGNOSIS — Z7901 Long term (current) use of anticoagulants: Secondary | ICD-10-CM | POA: Insufficient documentation

## 2024-03-07 DIAGNOSIS — Z79899 Other long term (current) drug therapy: Secondary | ICD-10-CM | POA: Diagnosis not present

## 2024-03-07 DIAGNOSIS — S0990XA Unspecified injury of head, initial encounter: Secondary | ICD-10-CM | POA: Diagnosis present

## 2024-03-07 DIAGNOSIS — W108XXA Fall (on) (from) other stairs and steps, initial encounter: Secondary | ICD-10-CM | POA: Insufficient documentation

## 2024-03-07 LAB — CUP PACEART REMOTE DEVICE CHECK
Battery Remaining Longevity: 134 mo
Battery Voltage: 3.19 V
Brady Statistic AP VP Percent: 26.97 %
Brady Statistic AP VS Percent: 0.03 %
Brady Statistic AS VP Percent: 72.67 %
Brady Statistic AS VS Percent: 0.33 %
Brady Statistic RA Percent Paced: 27.16 %
Brady Statistic RV Percent Paced: 99.64 %
Date Time Interrogation Session: 20260106194953
Implantable Lead Connection Status: 753985
Implantable Lead Connection Status: 753985
Implantable Lead Implant Date: 20251126
Implantable Lead Implant Date: 20251126
Implantable Lead Location: 753858
Implantable Lead Location: 753859
Implantable Lead Model: 3830
Implantable Lead Model: 5076
Implantable Pulse Generator Implant Date: 20251126
Lead Channel Impedance Value: 342 Ohm
Lead Channel Impedance Value: 418 Ohm
Lead Channel Impedance Value: 456 Ohm
Lead Channel Impedance Value: 570 Ohm
Lead Channel Pacing Threshold Amplitude: 0.5 V
Lead Channel Pacing Threshold Amplitude: 0.625 V
Lead Channel Pacing Threshold Pulse Width: 0.4 ms
Lead Channel Pacing Threshold Pulse Width: 0.4 ms
Lead Channel Sensing Intrinsic Amplitude: 26.75 mV
Lead Channel Sensing Intrinsic Amplitude: 26.75 mV
Lead Channel Sensing Intrinsic Amplitude: 3.375 mV
Lead Channel Sensing Intrinsic Amplitude: 3.375 mV
Lead Channel Setting Pacing Amplitude: 3.5 V
Lead Channel Setting Pacing Amplitude: 3.5 V
Lead Channel Setting Pacing Pulse Width: 0.4 ms
Lead Channel Setting Sensing Sensitivity: 0.9 mV
Zone Setting Status: 755011
Zone Setting Status: 755011

## 2024-03-07 LAB — BASIC METABOLIC PANEL WITH GFR
Anion gap: 9 (ref 5–15)
BUN: 22 mg/dL (ref 8–23)
CO2: 27 mmol/L (ref 22–32)
Calcium: 10.8 mg/dL — ABNORMAL HIGH (ref 8.9–10.3)
Chloride: 106 mmol/L (ref 98–111)
Creatinine, Ser: 1.17 mg/dL (ref 0.61–1.24)
GFR, Estimated: >60 mL/min
Glucose, Bld: 113 mg/dL — ABNORMAL HIGH (ref 70–99)
Potassium: 3.9 mmol/L (ref 3.5–5.1)
Sodium: 141 mmol/L (ref 135–145)

## 2024-03-07 LAB — CBC WITH DIFFERENTIAL/PLATELET
Abs Immature Granulocytes: 0.03 K/uL (ref 0.00–0.07)
Basophils Absolute: 0.1 K/uL (ref 0.0–0.1)
Basophils Relative: 1 %
Eosinophils Absolute: 0.1 K/uL (ref 0.0–0.5)
Eosinophils Relative: 2 %
HCT: 37.5 % — ABNORMAL LOW (ref 39.0–52.0)
Hemoglobin: 12.6 g/dL — ABNORMAL LOW (ref 13.0–17.0)
Immature Granulocytes: 0 %
Lymphocytes Relative: 14 %
Lymphs Abs: 1 K/uL (ref 0.7–4.0)
MCH: 31.3 pg (ref 26.0–34.0)
MCHC: 33.6 g/dL (ref 30.0–36.0)
MCV: 93.1 fL (ref 80.0–100.0)
Monocytes Absolute: 0.5 K/uL (ref 0.1–1.0)
Monocytes Relative: 7 %
Neutro Abs: 5.7 K/uL (ref 1.7–7.7)
Neutrophils Relative %: 76 %
Platelets: 200 K/uL (ref 150–400)
RBC: 4.03 MIL/uL — ABNORMAL LOW (ref 4.22–5.81)
RDW: 13 % (ref 11.5–15.5)
WBC: 7.5 K/uL (ref 4.0–10.5)
nRBC: 0 % (ref 0.0–0.2)

## 2024-03-07 MED ORDER — HYDRALAZINE HCL 20 MG/ML IJ SOLN
10.0000 mg | INTRAMUSCULAR | Status: DC | PRN
  Administered 2024-03-07: 10 mg via INTRAVENOUS
  Filled 2024-03-07: qty 1

## 2024-03-07 NOTE — ED Triage Notes (Addendum)
 Patient had a mechanical fall down 3-4 stairs while at church.  Patient c/o right elbow pain, has abrasion to elbow,  and small abrasion to chin.  Bleeding controlled.  Patient is on eliquis .  Patient denies hitting head, denies LOC. Per MD Belfi via secure chat, since patient denies hitting head, denies LOC, hold off on head imaging in triage.

## 2024-03-07 NOTE — Progress Notes (Signed)
 84 y/o on Eliquis  who presents after GLF with CT showing thin L convexity SDH  Recommendations SBP<140 Repeat CT head in 6 hours Ok for diet Hold all anticoagulation and antiplatelets If repeat CT head stable, can discharge with outpatient followup and hold Eliquis  for 1 week If repeat CT head is increased, will require admission to hospital, reversal with [facility]

## 2024-03-07 NOTE — ED Provider Notes (Signed)
 " [place] EMERGENCY DEPARTMENT AT [facility] Provider Note   CSN: [id number] Arrival date & time: 03/07/24  2008     Patient presents with: [name] [name] [name] [name] is a 84 y.o. male.   Patient is a 84 year old male who presents after a fall.  He was walking through a church going down a dark call and did not see that he had to walk down 2 or 3 steps into the main sanctuary.  He slipped on the steps and fell forward.  He hit his chin on the ground.  He thinks he hit his head but he is not sure.  He is on Eliquis .  He has some bruising to his right arm and a wound to his right elbow.  He has some bruising on his knees and some neck pain.  He denies any chest pain.  No abdominal pain.  He says his tetanus shot is up-to-date.  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  She would like us  to check and make sure there is no displacement of the pacemaker leads.       Prior to Admission medications  Medication Sig Start Date End Date Taking? Authorizing Provider  acetaminophen  (TYLENOL ) 325 MG tablet Take 2 tablets (650 mg total) by mouth every 6 (six) hours as needed for mild pain, fever or headache. 11/22/22   Cherlyn Labella, MD  apixaban  (ELIQUIS ) 5 MG TABS tablet TAKE 1 TABLET(5 MG) BY MOUTH TWICE DAILY 12/25/23   Jordan, Peter M, MD  diclofenac  Sodium (VOLTAREN ) 1 % GEL Apply 4 g topically 4 (four) times daily. Patient taking differently: Apply 4 g topically daily as needed (for pain). 11/22/22   Akula, Vijaya, MD  donepezil  (ARICEPT ) 10 MG tablet Take 10 mg by mouth at bedtime. 12/01/16   [provider]  doxycycline  (VIBRAMYCIN ) 100 MG capsule Take 100 mg by mouth 2 (two) times daily. 08/13/22   [provider]  fesoterodine  (TOVIAZ ) 8 MG TB24 tablet Take 8 mg by mouth daily. 12/13/18   [provider]  furosemide  (LASIX ) 20 MG tablet Take one tablet  daily. You may take an extra tablet for swelling, weight gain of 2 pounds overnight or 5 pounds in one week. 09/22/23   Jordan, Peter M, MD  ketoconazole (NIZORAL) 2 % cream Apply 1 application  topically daily as needed for irritation. 02/27/21   [provider]  metroNIDAZOLE  (METROGEL ) 1 % gel Apply 1 Application topically daily.    [provider]  mirabegron  ER (MYRBETRIQ ) 50 MG TB24 tablet Take 50 mg by mouth every morning.    [provider]  nitrofurantoin , macrocrystal-monohydrate, (MACROBID ) 100 MG capsule Take 1 capsule (100 mg total) by mouth in the morning. Patient taking differently: Take 100 mg by mouth every evening. 01/23/21   Gonfa, Taye T, MD  Omega-3 Fatty Acids  (FISH OIL ) 1200 MG CAPS Take 1,200 mg by mouth 2 (two) times daily.    [provider]  PRESCRIPTION MEDICATION Inhale into the lungs at bedtime. CPAP    [provider]  rosuvastatin  (CRESTOR ) 10 MG tablet Take 0.5 tablets (5 mg total) by mouth every other day. Patient taking differently: Take 5 mg by mouth every other day. Mon wed and Fridays 09/22/23   Jordan, Peter M, MD  terbinafine (LAMISIL) 1 % cream Apply 1 Application topically daily as needed (rash).  [provider]  triamcinolone  cream (KENALOG ) 0.1 % Apply 1 Application topically 2 (two) times daily as needed (rash). 10/13/22   [provider]    Allergies: Darifenacin, Enablex [darifenacin hydrobromide er], Atorvastatin, Ceftriaxone, Ezetimibe, Metoprolol , Oxybutynin, Sulfamethoxazole, Sulfasalazine, Levofloxacin, and Sulfonamide derivatives    Review of Systems  Constitutional:  Negative for fever.  HENT:  Negative for facial swelling and nosebleeds.   Respiratory:  Negative for shortness of breath.   Cardiovascular:  Negative for chest pain.  Gastrointestinal:  Negative for abdominal pain, nausea and vomiting.  Musculoskeletal:  Positive for arthralgias and neck pain. Negative for back pain  and joint swelling.  Skin:  Positive for wound.  Neurological:  Negative for weakness, numbness and headaches.    Updated Vital Signs BP 138/80   Pulse 89   Temp 98.1 F (36.7 C) (Oral)   Resp 14   Wt 83 kg   SpO2 98%   BMI 28.66 kg/m   Physical Exam Constitutional:      Appearance: He is well-developed.  HENT:     Head: Normocephalic and atraumatic.     Comments: Small abrasion to the chin.  No malocclusion of the jaw.  Teeth appear intact.  Minor tenderness over the mandible.    Nose:     Comments: No epistaxis Eyes:     Pupils: Pupils are equal, round, and reactive to light.  Neck:     Comments: Positive tenderness in the mid cervical spine.  No step-offs or deformities.  No pain to the thoracic or lumbosacral spine. Cardiovascular:     Rate and Rhythm: Normal rate and regular rhythm.     Heart sounds: Normal heart sounds.  Pulmonary:     Effort: Pulmonary effort is normal. No respiratory distress.     Breath sounds: Normal breath sounds. No wheezing or rales.  Chest:     Chest wall: No tenderness.  Abdominal:     General: Bowel sounds are normal.     Palpations: Abdomen is soft.     Tenderness: There is no abdominal tenderness. There is no guarding or rebound.  Musculoskeletal:        General: Normal range of motion.     Comments: Skin tear overlying the right elbow.  Mild underlying bony tenderness.  No swelling or deformity to the elbow.  There is an abrasion and area of ecchymosis to the mid upper arm.  No underlying bony tenderness to the remainder of the arm or the shoulder.  No pain to the forearm.  He is neuro vastly intact distally.  Positive tenderness on range of motion of the left hip.  No pain to the knee or lower leg.  There is a small abrasion distal to the right knee without underlying bony tenderness.  No pain on palpation or range of motion of the other extremities.  Lymphadenopathy:     Cervical: No cervical adenopathy.  Skin:    General: Skin is  warm and dry.     Findings: No rash.  Neurological:     Mental Status: He is alert and oriented to person, place, and time.     (all labs ordered are listed, but only abnormal results are displayed) Labs Reviewed - No data to display  EKG: None  Radiology: DG Hip Unilat W or Wo Pelvis 2-3 Views Left Result Date: 03/07/2024 CLINICAL DATA:  Hip pain post fall EXAM: DG HIP (WITH OR WITHOUT PELVIS) 2-3V LEFT COMPARISON:  09/14/2018 FINDINGS: SI joints are non widened.  Pubic symphysis and rami appear intact. Mild right femoroacetabular degenerative change. Left hip replacement with intact hardware and normal alignment. No definitive fracture IMPRESSION: Left hip replacement with intact hardware and normal alignment. No definitive fracture. Electronically Signed   By: Luke Bun M.D.   On: 03/07/2024 22:34   DG Chest Portable 1 View Result Date: 03/07/2024 CLINICAL DATA:  Fall EXAM: PORTABLE CHEST 1 VIEW COMPARISON:  01/24/2024, 06/19/2023 FINDINGS: Left-sided pacing device with similar lead orientation, projecting over right atrium and probably the right ventricle. Stable cardiomediastinal silhouette. No acute airspace disease, pleural effusion or pneumothorax. IMPRESSION: No active disease. Electronically Signed   By: Luke Bun M.D.   On: 03/07/2024 22:33   CT Maxillofacial Wo Contrast Result Date: 03/07/2024 EXAM: CT OF THE FACE WITHOUT CONTRAST 03/07/2024 10:13:43 PM TECHNIQUE: CT of the face was performed without the administration of intravenous contrast. Multiplanar reformatted images are provided for review. Automated exposure control, iterative reconstruction, and/or weight based adjustment of the mA/kV was utilized to reduce the radiation dose to as low as reasonably achievable. COMPARISON: None available. CLINICAL HISTORY: Facial trauma, blunt FINDINGS: FACIAL BONES: No acute facial fracture. No mandibular dislocation. No suspicious bone lesion. ORBITS: Globes are intact. No acute  traumatic injury. No inflammatory change. SINUSES AND MASTOIDS: No acute abnormality. SOFT TISSUES: No acute abnormality. IMPRESSION: 1. No acute facial fracture. Electronically signed by: Pinkie Pebbles MD MD 03/07/2024 10:27 PM EST RP Workstation: [id number]   CT Cervical Spine Wo Contrast Result Date: 03/07/2024 EXAM: CT CERVICAL SPINE WITHOUT CONTRAST 03/07/2024 10:13:43 PM TECHNIQUE: CT of the cervical spine was performed without the administration of intravenous contrast. Multiplanar reformatted images are provided for review. Automated exposure control, iterative reconstruction, and/or weight based adjustment of the mA/kV was utilized to reduce the radiation dose to as low as reasonably achievable. COMPARISON: None available. CLINICAL HISTORY: Neck trauma (Age >= 65y). FINDINGS: BONES AND ALIGNMENT: No acute fracture or traumatic malalignment. DEGENERATIVE CHANGES: Mild degenerative changes of the mid to lower cervical spine. SOFT TISSUES: No prevertebral soft tissue swelling. IMPRESSION: 1. No evidence of acute traumatic injury. Electronically signed by: Pinkie Pebbles MD MD 03/07/2024 10:25 PM EST RP Workstation: [id number]   CT Head Wo Contrast Result Date: 03/07/2024 EXAM: CT HEAD WITHOUT CONTRAST 03/07/2024 10:13:43 PM TECHNIQUE: CT of the head was performed without the administration of intravenous contrast. Automated exposure control, iterative reconstruction, and/or weight based adjustment of the mA/kV was utilized to reduce the radiation dose to as low as reasonably achievable. COMPARISON: 11/17/2022 CLINICAL HISTORY: Head trauma, minor (Age >= 65y). FINDINGS: BRAIN AND VENTRICLES: Acute left subdural hematoma along left frontotemporal convexity measuring up to 6 mm in thickness. No evidence of acute infarct. No hydrocephalus. Patchy hypoattenuation of periventricular and subcortical white matter, consistent with chronic microvascular ischemic change. Generalized cerebral atrophy.  Intracranial arterial calcification. No mass effect or midline shift. ORBITS: No acute abnormality. SINUSES: No acute abnormality. SOFT TISSUES AND SKULL: No acute soft tissue abnormality. No skull fracture. IMPRESSION: 1. Acute left subdural hematoma, measuring up to 6 mm in thickness, without midline shift. 2. Critical Value/emergent results were called by telephone at the time of interpretation on 03/07/2024 at 22123 hrs to provider Dr Andrea Ness. Electronically signed by: Pinkie Pebbles MD MD 03/07/2024 10:25 PM EST RP Workstation: [id number]   DG Elbow Complete Right Result Date: 03/07/2024 EXAM: 3 VIEW(S) XRAY OF THE ELBOW COMPARISON: None available. CLINICAL HISTORY: fall, injury to elbow FINDINGS: BONES AND JOINTS: Mild degenerative changes of the elbow.  Possible small joint effusion. The lateral view is technically limited secondary to patient positioning. SOFT TISSUES: Unremarkable. IMPRESSION: 1. No acute fracture or dislocation. 2. Possible small joint effusion. Electronically signed by: Greig Pique MD MD 03/07/2024 08:44 PM EST RP Workstation: [id number]     Procedures   Medications Ordered in the ED  hydrALAZINE  (APRESOLINE ) injection 10 mg (has no administration in time range)                                    Medical Decision Making Amount and/or Complexity of Data Reviewed Radiology: ordered.   This patient presents to the ED for concern of fall, this involves an extensive number of treatment options, and is a complaint that carries with it a high risk of complications and morbidity.  I considered the following differential and admission for this acute, potentially life threatening condition.  The differential diagnosis includes intracranial hemorrhage, spinal fracture, extremity fracture, chest trauma, abdominal trauma, syncope  MDM:    Patient is a 84 year old male who presents after mechanical fall.  He is on Eliquis  for paroxysmal atrial fibrillation.  He initially  stated that he did not have any head injury but this was not entirely clear.  For this reason CT scan of his head and cervical spine were performed.  There is evidence of a 6 mm subdural hematoma with no midline shift.  His CT scan of his cervical spine was negative for injury.  CT scan of the maxillofacial bones was negative for fracture.  He does not have any suturable wounds.  X-rays of his left hip and left elbow were negative for fracture.  Discussed with Dr. Darnella with neurosurgery.  He recommends keeping the patient here for 6 hours and repeating the scan.  If stable, he can be discharged and hold his Eliquis  for 1 week.  I do not see that he is on any other antiplatelet medications.  If it enlarges, will need reversal of the Eliquis  and admission. Care turned over to Dr. Trine.  CRITICAL CARE Performed by: Andrea Ness Total critical care time: 60 minutes Critical care time was exclusive of separately billable procedures and treating other patients. Critical care was necessary to treat or prevent imminent or life-threatening deterioration. Critical care was time spent personally by me on the following activities: development of treatment plan with patient and/or surrogate as well as nursing, discussions with consultants, evaluation of patient's response to treatment, examination of patient, obtaining history from patient or surrogate, ordering and performing treatments and interventions, ordering and review of laboratory studies, ordering and review of radiographic studies, pulse oximetry and re-evaluation of patient's condition.   (Labs, imaging, consults)  Labs: I Ordered, and personally interpreted labs.  The pertinent results include: Pending  Imaging Studies ordered: I ordered imaging studies including CT head, CT cervical spine, CT maxillofacial bones, x-ray right elbow, x-ray left hip I independently visualized and interpreted imaging. I agree with the radiologist  interpretation  Additional history obtained from daughter at bedside.  External records from outside source obtained and reviewed including prior notes  Cardiac Monitoring: The patient was maintained on a cardiac monitor.  If on the cardiac monitor, I personally viewed and interpreted the cardiac monitored which showed an underlying rhythm of:    Reevaluation: After the interventions noted above, I reevaluated the patient and found that they have :stayed the same  Social Determinants of Health:  Disposition:  pending  Co morbidities that complicate the patient evaluation  Past Medical History:  Diagnosis Date   Alzheimer disease (HCC)    Chronic kidney disease    mild insuffiency   Coronary artery disease    LHC 4/10: Mid LAD 40-50%, then 70%, OM1 20-30%, EF 65%. Mid LAD FFR 0.89 (not hemodynamically significant). Medical therapy was continued.   Dysuria 08/10/2020   Hyperlipidemia    LBBB (left bundle branch block) 06/2018   Obstructive sleep apnea      Medicines Meds ordered this encounter  Medications   hydrALAZINE  (APRESOLINE ) injection 10 mg    I have reviewed the patients home medicines and have made adjustments as needed  Problem List / ED Course: Problem List Items Addressed This Visit   None            Final diagnoses:  None    ED Discharge Orders     None          Lenor Hollering, MD 03/07/24 2315  "

## 2024-03-07 NOTE — ED Notes (Signed)
 Pt daughter [name] requested to call her if any changes in his care occur, number in the chart.

## 2024-03-08 ENCOUNTER — Telehealth: Payer: Self-pay | Admitting: Cardiology

## 2024-03-08 ENCOUNTER — Emergency Department (HOSPITAL_BASED_OUTPATIENT_CLINIC_OR_DEPARTMENT_OTHER)

## 2024-03-08 NOTE — ED Notes (Addendum)
 Provider made aware of SBP after hydralazine . No new orders at this time.

## 2024-03-08 NOTE — ED Provider Notes (Signed)
 I assumed care of this patient from previous provider.  Please see their note for further details of history, exam, and MDM.   Briefly patient is a 84 y.o. male who presented fall resulting in small SDH. Patient is on [facility]. NSU recommended repeat CT in am.  4:59 AM CT with improved/stable SDH. Stable for DC as planned with [facility] hold for 1 week.  The patient appears reasonably screened and/or stabilized for discharge and I doubt any other medical condition or other [facility] requiring further screening, evaluation, or treatment in the ED at this time. I have discussed the findings, Dx and Tx plan with the patient/family who expressed understanding and agree(s) with the plan. Discharge instructions discussed at length. The patient/family was given strict return precautions who verbalized understanding of the instructions. No further questions at time of discharge.  Disposition: Discharge  Condition: Good  ED Discharge Orders     None         Follow Up: McClanahan, Kyra, NP [address] [place] [address] [phone]  Call  to schedule an appointment for close follow up  Darnella Dorn SAUNDERS, MD [address] [place] [address] [phone]  Call  to schedule an appointment for close follow up          Trine Raynell Moder, MD 03/08/24 [phone]

## 2024-03-08 NOTE — Telephone Encounter (Signed)
 Pt c/o medication issue:  1. Name of Medication: apixaban  (ELIQUIS ) 5 MG TABS tablet   2. How are you currently taking this medication (dosage and times per day)? TAKE 1 TABLET(5 MG) BY MOUTH TWICE DAILY   3. Are you having a reaction (difficulty breathing--STAT)? no  4. What is your medication issue?  Patient has [place] the church [place] right around 7. Patient daughter states to call Dr. Jordan to ask if he can hold the medication due bleeding in the brain    Please advise

## 2024-03-08 NOTE — Telephone Encounter (Signed)
 Called and spoke to daughter, [name]. Pt had a mechanical fall on 03/07/24 while at church. Went to ER [facility]. Small SDH on CT and repeat CT was improved, so pt was d/c home and Eliquis  was paused for one week.   Made f/u appt with Daneen, NP on 03/12/24. He will also be seeing PCP next week.  Daughter will be staying with pt most of the day and will be observing him closely for any changes. She is the one that cares for him and gives him his daily medications. She accidentally gave him his morning dose of Eliquis  today, but she has now removed this medication from the pill box so that it does not happen again. She is very concerned about her father's current situation and I reassured her that she is doing everything she can for him. He is having to sleep in the recliner as it is very difficult to get him in/out of bed. She will continue to monitor him closely over the weekend.

## 2024-03-08 NOTE — Discharge Instructions (Addendum)
 Please hold Eliquis  (blood thinner) and do not take any NSAIDs (ie. Aspirin , ibuprofen, naproxen, etc.) for 1 week and restart Eliquis  on 1/16. Please inform your PCP and/or cardiologist about the need to hold your mediation.

## 2024-03-09 NOTE — Telephone Encounter (Signed)
 Daughter calling for Pt. Pt. Able to state full name , DOB and gave permission to speak with Daughter. States he had a recent fall and developed a 6mm brain bleed that is now down to 5mm. Was discharged from the hospital  and was taken home. Daughter states that he is not acting right. States he is very sleepy and will not follow directions. Temp. 100.8. States she is very concerned about the brain bleed. ED disposition given.   Reason for Disposition  Patient sounds very sick or weak to the triager  Protocols used: Neurologic Deficit-A-AH

## 2024-03-10 ENCOUNTER — Emergency Department (HOSPITAL_COMMUNITY)

## 2024-03-10 ENCOUNTER — Encounter (HOSPITAL_COMMUNITY): Payer: Self-pay | Admitting: *Deleted

## 2024-03-10 ENCOUNTER — Other Ambulatory Visit: Payer: Self-pay

## 2024-03-10 ENCOUNTER — Ambulatory Visit: Payer: Self-pay | Admitting: Student in an Organized Health Care Education/Training Program

## 2024-03-10 ENCOUNTER — Inpatient Hospital Stay (HOSPITAL_COMMUNITY)
Admission: EM | Admit: 2024-03-10 | Discharge: 2024-03-14 | DRG: 083 | Disposition: A | Discharge status: Home Health | Attending: Internal Medicine | Admitting: Internal Medicine

## 2024-03-10 DIAGNOSIS — I130 Hypertensive heart and chronic kidney disease with heart failure and stage 1 through stage 4 chronic kidney disease, or unspecified chronic kidney disease: Secondary | ICD-10-CM | POA: Diagnosis present

## 2024-03-10 DIAGNOSIS — L8990 Pressure ulcer of unspecified site, unspecified stage: Secondary | ICD-10-CM | POA: Insufficient documentation

## 2024-03-10 DIAGNOSIS — F03918 Unspecified dementia, unspecified severity, with other behavioral disturbance: Secondary | ICD-10-CM | POA: Diagnosis present

## 2024-03-10 DIAGNOSIS — S069XAA Unspecified intracranial injury with loss of consciousness status unknown, initial encounter: Secondary | ICD-10-CM | POA: Diagnosis present

## 2024-03-10 DIAGNOSIS — L89151 Pressure ulcer of sacral region, stage 1: Secondary | ICD-10-CM | POA: Diagnosis present

## 2024-03-10 DIAGNOSIS — W109XXA Fall (on) (from) unspecified stairs and steps, initial encounter: Secondary | ICD-10-CM | POA: Diagnosis present

## 2024-03-10 DIAGNOSIS — Z881 Allergy status to other antibiotic agents status: Secondary | ICD-10-CM

## 2024-03-10 DIAGNOSIS — Z882 Allergy status to sulfonamides status: Secondary | ICD-10-CM

## 2024-03-10 DIAGNOSIS — I480 Paroxysmal atrial fibrillation: Secondary | ICD-10-CM | POA: Diagnosis present

## 2024-03-10 DIAGNOSIS — R4182 Altered mental status, unspecified: Secondary | ICD-10-CM | POA: Diagnosis present

## 2024-03-10 DIAGNOSIS — F02818 Dementia in other diseases classified elsewhere, unspecified severity, with other behavioral disturbance: Secondary | ICD-10-CM | POA: Diagnosis present

## 2024-03-10 DIAGNOSIS — Z87891 Personal history of nicotine dependence: Secondary | ICD-10-CM

## 2024-03-10 DIAGNOSIS — Z792 Long term (current) use of antibiotics: Secondary | ICD-10-CM

## 2024-03-10 DIAGNOSIS — I5032 Chronic diastolic (congestive) heart failure: Secondary | ICD-10-CM | POA: Diagnosis present

## 2024-03-10 DIAGNOSIS — R531 Weakness: Secondary | ICD-10-CM | POA: Diagnosis present

## 2024-03-10 DIAGNOSIS — G309 Alzheimer's disease, unspecified: Secondary | ICD-10-CM | POA: Diagnosis present

## 2024-03-10 DIAGNOSIS — R262 Difficulty in walking, not elsewhere classified: Secondary | ICD-10-CM | POA: Diagnosis present

## 2024-03-10 DIAGNOSIS — Z950 Presence of cardiac pacemaker: Secondary | ICD-10-CM

## 2024-03-10 DIAGNOSIS — Z1152 Encounter for screening for COVID-19: Secondary | ICD-10-CM

## 2024-03-10 DIAGNOSIS — R001 Bradycardia, unspecified: Secondary | ICD-10-CM | POA: Diagnosis present

## 2024-03-10 DIAGNOSIS — I10 Essential (primary) hypertension: Secondary | ICD-10-CM | POA: Diagnosis present

## 2024-03-10 DIAGNOSIS — G4733 Obstructive sleep apnea (adult) (pediatric): Secondary | ICD-10-CM | POA: Diagnosis present

## 2024-03-10 DIAGNOSIS — I447 Left bundle-branch block, unspecified: Secondary | ICD-10-CM | POA: Diagnosis present

## 2024-03-10 DIAGNOSIS — F0280 Dementia in other diseases classified elsewhere without behavioral disturbance: Secondary | ICD-10-CM | POA: Diagnosis present

## 2024-03-10 DIAGNOSIS — E785 Hyperlipidemia, unspecified: Secondary | ICD-10-CM | POA: Diagnosis present

## 2024-03-10 DIAGNOSIS — Z9181 History of falling: Secondary | ICD-10-CM

## 2024-03-10 DIAGNOSIS — I959 Hypotension, unspecified: Secondary | ICD-10-CM | POA: Diagnosis present

## 2024-03-10 DIAGNOSIS — K1123 Chronic sialoadenitis: Secondary | ICD-10-CM | POA: Diagnosis present

## 2024-03-10 DIAGNOSIS — Z8249 Family history of ischemic heart disease and other diseases of the circulatory system: Secondary | ICD-10-CM

## 2024-03-10 DIAGNOSIS — M25552 Pain in left hip: Secondary | ICD-10-CM | POA: Diagnosis present

## 2024-03-10 DIAGNOSIS — Z888 Allergy status to other drugs, medicaments and biological substances status: Secondary | ICD-10-CM

## 2024-03-10 DIAGNOSIS — Z7901 Long term (current) use of anticoagulants: Secondary | ICD-10-CM

## 2024-03-10 DIAGNOSIS — Z96642 Presence of left artificial hip joint: Secondary | ICD-10-CM | POA: Diagnosis present

## 2024-03-10 DIAGNOSIS — Z79899 Other long term (current) drug therapy: Secondary | ICD-10-CM

## 2024-03-10 DIAGNOSIS — N1831 Chronic kidney disease, stage 3a: Secondary | ICD-10-CM | POA: Diagnosis present

## 2024-03-10 DIAGNOSIS — I2510 Atherosclerotic heart disease of native coronary artery without angina pectoris: Secondary | ICD-10-CM | POA: Diagnosis present

## 2024-03-10 DIAGNOSIS — S065XAA Traumatic subdural hemorrhage with loss of consciousness status unknown, initial encounter: Principal | ICD-10-CM | POA: Diagnosis present

## 2024-03-10 DIAGNOSIS — R509 Fever, unspecified: Secondary | ICD-10-CM | POA: Diagnosis present

## 2024-03-10 LAB — URINALYSIS, W/ REFLEX TO CULTURE (INFECTION SUSPECTED)
Bacteria, UA: NONE SEEN
Bilirubin Urine: NEGATIVE
Glucose, UA: NEGATIVE mg/dL
Hgb urine dipstick: NEGATIVE
Ketones, ur: NEGATIVE mg/dL
Leukocytes,Ua: NEGATIVE
Nitrite: NEGATIVE
Protein, ur: NEGATIVE mg/dL
Specific Gravity, Urine: 1.023 (ref 1.005–1.030)
pH: 5 (ref 5.0–8.0)

## 2024-03-10 LAB — CBC WITH DIFFERENTIAL/PLATELET
Abs Immature Granulocytes: 0.02 K/uL (ref 0.00–0.07)
Basophils Absolute: 0 K/uL (ref 0.0–0.1)
Basophils Relative: 1 %
Eosinophils Absolute: 0 K/uL (ref 0.0–0.5)
Eosinophils Relative: 0 %
HCT: 43.9 % (ref 39.0–52.0)
Hemoglobin: 14.7 g/dL (ref 13.0–17.0)
Immature Granulocytes: 0 %
Lymphocytes Relative: 15 %
Lymphs Abs: 1.1 K/uL (ref 0.7–4.0)
MCH: 32.1 pg (ref 26.0–34.0)
MCHC: 33.5 g/dL (ref 30.0–36.0)
MCV: 95.9 fL (ref 80.0–100.0)
Monocytes Absolute: 0.9 K/uL (ref 0.1–1.0)
Monocytes Relative: 12 %
Neutro Abs: 5.4 K/uL (ref 1.7–7.7)
Neutrophils Relative %: 72 %
Platelets: 202 K/uL (ref 150–400)
RBC: 4.58 MIL/uL (ref 4.22–5.81)
RDW: 13.2 % (ref 11.5–15.5)
WBC: 7.5 K/uL (ref 4.0–10.5)
nRBC: 0 % (ref 0.0–0.2)

## 2024-03-10 LAB — COMPREHENSIVE METABOLIC PANEL WITH GFR
ALT: 24 U/L (ref 0–44)
AST: 29 U/L (ref 15–41)
Albumin: 4.5 g/dL (ref 3.5–5.0)
Alkaline Phosphatase: 80 U/L (ref 38–126)
Anion gap: 11 (ref 5–15)
BUN: 27 mg/dL — ABNORMAL HIGH (ref 8–23)
CO2: 25 mmol/L (ref 22–32)
Calcium: 10.8 mg/dL — ABNORMAL HIGH (ref 8.9–10.3)
Chloride: 105 mmol/L (ref 98–111)
Creatinine, Ser: 1.23 mg/dL (ref 0.61–1.24)
GFR, Estimated: 58 mL/min — ABNORMAL LOW
Glucose, Bld: 137 mg/dL — ABNORMAL HIGH (ref 70–99)
Potassium: 4.3 mmol/L (ref 3.5–5.1)
Sodium: 142 mmol/L (ref 135–145)
Total Bilirubin: 0.9 mg/dL (ref 0.0–1.2)
Total Protein: 8.8 g/dL — ABNORMAL HIGH (ref 6.5–8.1)

## 2024-03-10 LAB — RESP PANEL BY RT-PCR (RSV, FLU A&B, COVID)  RVPGX2
Influenza A by PCR: NEGATIVE
Influenza B by PCR: NEGATIVE
Resp Syncytial Virus by PCR: NEGATIVE
SARS Coronavirus 2 by RT PCR: NEGATIVE

## 2024-03-10 LAB — I-STAT CG4 LACTIC ACID, ED: Lactic Acid, Venous: 1.6 mmol/L (ref 0.5–1.9)

## 2024-03-10 MED ORDER — PROCHLORPERAZINE EDISYLATE 10 MG/2ML IJ SOLN: 5.0000 mg | Freq: Four times a day (QID) | INTRAMUSCULAR | Status: DC | PRN

## 2024-03-10 MED ORDER — LACTATED RINGERS IV SOLN: INTRAVENOUS | Status: AC

## 2024-03-10 MED ORDER — ACETAMINOPHEN 500 MG PO TABS
500.0000 mg | ORAL_TABLET | Freq: Four times a day (QID) | ORAL | Status: DC | PRN
  Administered 2024-03-12 – 2024-03-13 (×2): 500 mg via ORAL
  Filled 2024-03-10 (×2): qty 1

## 2024-03-10 MED ORDER — DOXYCYCLINE HYCLATE 100 MG PO TABS
100.0000 mg | ORAL_TABLET | Freq: Two times a day (BID) | ORAL | Status: DC
  Administered 2024-03-10 – 2024-03-14 (×9): 100 mg via ORAL
  Filled 2024-03-10 (×9): qty 1

## 2024-03-10 MED ORDER — SACCHAROMYCES BOULARDII 250 MG PO CAPS
250.0000 mg | ORAL_CAPSULE | Freq: Two times a day (BID) | ORAL | Status: DC
  Administered 2024-03-10 – 2024-03-14 (×9): 250 mg via ORAL
  Filled 2024-03-10 (×11): qty 1

## 2024-03-10 MED ORDER — POLYETHYLENE GLYCOL 3350 17 G PO PACK: 17.0000 g | PACK | Freq: Every day | ORAL | Status: DC | PRN

## 2024-03-10 MED ORDER — MELATONIN 5 MG PO TABS
5.0000 mg | ORAL_TABLET | Freq: Every evening | ORAL | Status: DC | PRN
  Filled 2024-03-10: qty 1

## 2024-03-10 MED ORDER — ROSUVASTATIN CALCIUM 5 MG PO TABS
5.0000 mg | ORAL_TABLET | ORAL | Status: DC
  Administered 2024-03-10 – 2024-03-14 (×3): 5 mg via ORAL
  Filled 2024-03-10 (×4): qty 1

## 2024-03-10 MED ORDER — NITROFURANTOIN MONOHYD MACRO 100 MG PO CAPS
100.0000 mg | ORAL_CAPSULE | Freq: Every evening | ORAL | Status: DC
  Administered 2024-03-10 – 2024-03-13 (×4): 100 mg via ORAL
  Filled 2024-03-10 (×6): qty 1

## 2024-03-10 MED ORDER — DONEPEZIL HCL 5 MG PO TABS
10.0000 mg | ORAL_TABLET | Freq: Every day | ORAL | Status: DC
  Administered 2024-03-10 – 2024-03-13 (×4): 10 mg via ORAL
  Filled 2024-03-10 (×4): qty 2

## 2024-03-10 NOTE — Evaluation (Signed)
 Physical Therapy Evaluation Patient Details Name: [name] [name] MRN: [id number] DOB: [birth year] Today's Date: 03/10/2024  History of Present Illness  84 y.o. male presents to [facility] 03/10/24 with AMS and progressive weakness. Workup unrevealing. Prior admit and d/c home 1/8 after a fall with small SDH. CT scan 1/11 showed improvement of SDH. PMHx: Alzheimer's dementia, OSA on CPAP, CKD 3A, symptomatic bradycardia status post pacemaker placement, chronic HFpEF, LBBB, CAD, paroxysmal A-fib on Eliquis , chronic sialadenitis   Clinical Impression  PTA, pt was ModI with use of RW and walking 1 mile a day. After falling on 1/8, pt has been sleeping in a recliner at home and needing +2 assist to transfer. Pt presents below mobility baseline with L hip pain, L LE weakness, and impaired balance/gait pattern. Pt required ModAx2 for bed mobility with increased difficulty advancing LLE. MinAx2 to stand with use of RW. When attempting to take steps, pt reported L hip pain with inability to advance LLE. Requested to return to seated position with assist needed to lower and shift hips. Recommending <3hrs post acute rehab with acute PT to follow.         If plan is discharge home, recommend the following: A lot of help with walking and/or transfers;A lot of help with bathing/dressing/bathroom;Assist for transportation;Help with stairs or ramp for entrance;Assistance with cooking/housework   Can travel by private vehicle   No    Equipment Recommendations Wheelchair (measurements PT);Wheelchair cushion (measurements PT);BSC/3in1     Functional Status Assessment Patient has had a recent decline in their functional status and demonstrates the ability to make significant improvements in function in a reasonable and predictable amount of time.     Precautions / Restrictions Precautions Precautions: Fall Recall of Precautions/Restrictions: Impaired Restrictions Weight Bearing Restrictions Per Provider Order: No       Mobility  Bed Mobility Overal bed mobility: Needs Assistance Bed Mobility: Supine to Sit, Sit to Supine    Supine to sit: Mod assist, +2 for physical assistance, +2 for safety/equipment Sit to supine: Mod assist, +2 for safety/equipment   General bed mobility comments: assist to bring LLE off EOB and to raise trunk via 1HH. Assist to return to supine for LLE managment and to shift shoulders    Transfers Overall transfer level: Needs assistance Equipment used: Rolling walker (2 wheels) Transfers: Sit to/from Stand Sit to Stand: Min assist, +2 physical assistance, +2 safety/equipment    General transfer comment: MinAx2 to boost-up with use of RW. Attempted to take steps, however, pt unable due to pain in L hip. Declined further attempts with desire to return to seated position. Needed assist to shift hips for proper alignement when returning to seated position    Ambulation/Gait  General Gait Details: unable this date     Balance Overall balance assessment: Needs assistance, Mild deficits observed, not formally tested, History of Falls Sitting-balance support: Feet supported, No upper extremity supported Sitting balance-Leahy Scale: Fair    Standing balance support: Bilateral upper extremity supported, During functional activity, Reliant on assistive device for balance Standing balance-Leahy Scale: Poor Standing balance comment: reliant on UE and external support       Pertinent Vitals/Pain Pain Assessment Pain Assessment: Faces Faces Pain Scale: Hurts even more Pain Location: L hip when attempting to step Pain Descriptors / Indicators: Discomfort, Grimacing, Guarding, Crushing Pain Intervention(s): Limited activity within patient's tolerance, Monitored during session, Repositioned    Home Living Family/patient expects to be discharged to:: Private residence Living Arrangements: Children (son and daughter)  Available Help at Discharge: Family Type of Home:  House Home Access: Level entry    Home Layout: One level Home Equipment: Agricultural Consultant (2 wheels) Additional Comments: Unsure if acurate home set-up    Prior Function Prior Level of Function : Patient poor historian/Family not available;Needs assist;History of Falls (last six months)    Mobility Comments: Prior to fall 1/8, pt was ModI with RW. Has been needing +2 assist to transfer from family at home. Has been sleeping in a recliner ADLs Comments: Son close by when pt bathes     Extremity/Trunk Assessment   Upper Extremity Assessment Upper Extremity Assessment: Defer to OT evaluation    Lower Extremity Assessment Lower Extremity Assessment: LLE deficits/detail LLE Deficits / Details: Difficult to assess due to pain, at least 3/5 with pt able to bear weight with no evidence of buckling. Pain with hip ABD and attemps to step LLE: Unable to fully assess due to pain LLE Sensation: WNL    Cervical / Trunk Assessment Cervical / Trunk Assessment: Normal  Communication   Communication Communication: Impaired Factors Affecting Communication: Hearing impaired    Cognition Arousal: Alert Behavior During Therapy: WFL for tasks assessed/performed   PT - Cognitive impairments: History of cognitive impairments, Initiation, Sequencing, Problem solving, Safety/Judgement    Following commands: Impaired Following commands impaired: Follows multi-step commands inconsistently, Follows one step commands with increased time     Cueing Cueing Techniques: Verbal cues, Tactile cues      PT Assessment Patient needs continued PT services  PT Problem List Decreased strength;Decreased activity tolerance;Decreased balance;Decreased mobility;Decreased cognition;Decreased safety awareness;Decreased knowledge of use of DME;Pain       PT Treatment Interventions DME instruction;Gait training;Functional mobility training;Therapeutic activities;Therapeutic exercise;Balance training;Neuromuscular  re-education;Patient/family education;Wheelchair mobility training    PT Goals (Current goals can be found in the Care Plan section)  Acute Rehab PT Goals Patient Stated Goal: to go home PT Goal Formulation: With patient Time For Goal Achievement: 03/24/24 Potential to Achieve Goals: Good    Frequency Min 2X/week     Co-evaluation   Reason for Co-Treatment: For patient/therapist safety;To address functional/ADL transfers PT goals addressed during session: Mobility/safety with mobility;Balance;Proper use of DME         AM-PAC PT 6 Clicks Mobility  Outcome Measure Help needed turning from your back to your side while in a flat bed without using bedrails?: A Lot Help needed moving from lying on your back to sitting on the side of a flat bed without using bedrails?: Total Help needed moving to and from a bed to a chair (including a wheelchair)?: Total Help needed standing up from a chair using your arms (e.g., wheelchair or bedside chair)?: A Lot Help needed to walk in hospital room?: Total Help needed climbing 3-5 steps with a railing? : Total 6 Click Score: 8    End of Session Equipment Utilized During Treatment: Gait belt Activity Tolerance: Patient tolerated treatment well Patient left: in bed;with call bell/phone within reach Nurse Communication: Mobility status PT Visit Diagnosis: Unsteadiness on feet (R26.81);Other abnormalities of gait and mobility (R26.89);Muscle weakness (generalized) (M62.81);Pain Pain - Right/Left: Left Pain - part of body: Hip    Time: 9184-9164 PT Time Calculation (min) (ACUTE ONLY): 20 min   Charges:   PT Evaluation $PT Eval Low Complexity: 1 Low   PT General Charges $$ ACUTE PT VISIT: 1 Visit       Kate ORN, PT, DPT Secure Chat Preferred  Rehab Office [phone]   Kate BRAVO Wendolyn  03/10/2024, 9:41 AM

## 2024-03-10 NOTE — Progress Notes (Signed)
 Patients daughter states for staff to not give him any blood pressure medications, not one in particular, due to patients pressures dropping fast. MD notified.

## 2024-03-10 NOTE — Progress Notes (Signed)
 " PROGRESS NOTE    [name] [name]  FMW:[id number] DOB: 05/29/1940 DOA: 03/10/2024 PCP: Wallie Drafts, NP   Brief Narrative:  [name] [name] is a 84 y.o. 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  He had a repeated CT scan the following day.  Neurosurgery recommended to hold off anticoagulation and antiplatelets for 1 week.  The patient was subsequently discharged home.  Per family members (son/daughter) he has been more somnolent and weak requiring 2 people assist for walking since his fall -patient previously able to walk upwards of 1 mile per day independently.  He does require some assistance at home with ADLs but is mostly independent per discussion with daughter [name].  Assessment & Plan:   Principal Problem:   Subdural hematoma, post-traumatic (HCC) Active Problems:   Closed TBI (traumatic brain injury) (HCC)   Hyperlipidemia   OSA on CPAP   Coronary atherosclerosis   Benign essential hypertension   Chronic heart failure with preserved ejection fraction (HFpEF, >= 50%) (HCC)   CAD (nonobstructive CAD seen on LHC 2010 )   Alzheimer's dementia (HCC)   Dementia with behavioral disturbance (HCC)   Pressure injury of skin  Somnolence, weakness Traumatic brain injury, POA Subdural hematoma, improving, POA - Likely secondary to recent TBI, patient has had profoundly increased somnolence and requiring additional assistance from prior baseline after prior TBI and fall with noted subdural hematoma. - PT OT following, lengthy discussion with family, daughter [name] [name], and we discussed his likely increased needs in the interim post trauma.   Pending PT and OT recommendations patient may benefit from a short stay at skilled nursing facility versus increased therapy at home. -Exacerbated by chronic Alzheimer's dementia without behavioral disturbances -continue home meds - Patient remains a high fall risk given history with new weakness  Stable chronic comorbid conditions: Chronic essential hypertension Hyperlipidemia OSA on CPAP CAD, nonobstructive Pressure injury, POA: Wound 03/10/24 1729 Pressure Injury Sacrum Medial Stage 1 -  Intact skin with non-blanchable redness of a localized area usually over a bony prominence. (Active)   - Continue home medications as below - Daughter indicates patient has profoundly sensitive blood pressure, noted to have profound hypotension with even small dose antihypertensives, as such we will avoid any as needed medications at this time unless profoundly elevated   DVT prophylaxis: SCDs Start: 03/10/24 0332 hold anticoagulation secondary to previous subdural hematoma per neurosurgery recommendations Code Status:   Code Status: Full Code Family Communication: Daughter [name] updated over the phone  Status is:  observation  Dispo: The patient is from: Home              Anticipated d/c is to: To be determined              Anticipated d/c date is: To be determined              Patient currently not medically stable for discharge  Consultants:  None  Procedures:  None  Antimicrobials:  None indicated  Subjective: No acute issues or events overnight, patient this morning appears to be approaching baseline per prior report, much more awake alert and oriented today.  He denies any headache fevers  chills nausea vomiting diarrhea constipation.  He does note pointed left hip pain laterally at the femoral head but has intact range of motion and weightbearing on that side.  Objective: Vitals:   03/10/24 1245 03/10/24 1247 03/10/24 1634 03/10/24 1700  BP: (!) 145/81   138/74  Pulse: 80  (P)  70 70  Resp: 17  (P) 19 19  Temp:  98 F (36.7 C) (!) (P) 97.5 F (36.4 C) 97.6 F (36.4 C)  TempSrc:  Oral (P) Oral Oral  SpO2: 100%  (P) 100% 99%  Weight:      Height:        Intake/Output Summary (Last 24 hours) at 03/10/2024 1824 Last data filed at 03/10/2024 1800 Gross per 24 hour  Intake 1035.26 ml  Output --  Net 1035.26 ml   Filed Weights   03/10/24 0046  Weight: 83 kg    Examination:  General:  Pleasantly resting in bed, No acute distress. HEENT:  Normocephalic atraumatic.  Sclerae nonicteric, noninjected.  Extraocular movements intact bilaterally. Neck:  Without mass or deformity.  Trachea is midline. Lungs:  Clear to auscultate bilaterally without rhonchi, wheeze, or rales. Heart:  Regular rate and rhythm.  Without murmurs, rubs, or gallops. Abdomen:  Soft, nontender, nondistended.  Without guarding or rebound. Extremities: Without cyanosis, clubbing, edema, or obvious deformity.  Pointed tenderness at the left lateral femoral head Skin:  Warm and dry, no erythema.  Data Reviewed: I have personally reviewed following labs and imaging studies  CBC: Recent Labs  Lab 03/07/24 2327 03/10/24 0103  WBC 7.5 7.5  NEUTROABS 5.7 5.4  HGB 12.6* 14.7  HCT 37.5* 43.9  MCV 93.1 95.9  PLT 200 202   Basic Metabolic Panel: Recent Labs  Lab 03/07/24 2327 03/10/24 0103  NA 141 142  K 3.9 4.3  CL 106 105  CO2 27 25  GLUCOSE 113* 137*  BUN 22 27*  CREATININE 1.17 1.23  CALCIUM  10.8* 10.8*   GFR: Estimated Creatinine Clearance: 45.3 mL/min (by C-G formula based on SCr of 1.23 mg/dL). Liver Function Tests: Recent Labs  Lab 03/10/24 0103  AST 29  ALT 24  ALKPHOS 80  BILITOT 0.9  PROT 8.8*  ALBUMIN 4.5   BNP (last 3 results) Recent Labs    01/23/24 1247  PROBNP 239.0   Sepsis Labs: Recent Labs  Lab 03/10/24 0115  LATICACIDVEN 1.6    Recent Results (from the past 240 hours)  Blood culture (routine x 2)     Status: None (Preliminary result)    Collection Time: 03/10/24 12:59 AM   Specimen: BLOOD LEFT FOREARM  Result Value Ref Range Status   Specimen Description BLOOD LEFT FOREARM  Final   Special Requests   Final    BOTTLES DRAWN AEROBIC AND ANAEROBIC Blood Culture adequate volume   Culture   Final    NO GROWTH < 12 HOURS Performed at [facility], [address], [place], [place] [address]    Report Status PENDING  Incomplete  Resp panel by RT-PCR (RSV, Flu A&B, Covid)     Status: None   Collection Time: 03/10/24 12:59 AM   Specimen: Nasal Swab  Result Value Ref Range Status   SARS Coronavirus 2 by RT PCR NEGATIVE NEGATIVE Final   Influenza A by PCR NEGATIVE NEGATIVE Final   Influenza B by PCR NEGATIVE NEGATIVE Final    Comment: (NOTE) The Xpert Xpress SARS-CoV-2/FLU/RSV plus assay is intended as an aid in the diagnosis of influenza from  Nasopharyngeal swab specimens and should not be used as a sole basis for treatment. Nasal washings and aspirates are unacceptable for Xpert Xpress SARS-CoV-2/FLU/RSV testing.  Fact Sheet for Patients: bloggercourse.com  Fact Sheet for Healthcare Providers: seriousbroker.it  This test is not yet approved or cleared by the [place]  FDA and has been authorized for detection and/or diagnosis of SARS-CoV-2 by FDA under an Emergency Use Authorization (EUA). This EUA will remain in effect (meaning this test can be used) for the duration of the COVID-19 declaration under Section 564(b)(1) of the Act, 21 U.S.C. section 360bbb-3(b)(1), unless the authorization is terminated or revoked.     Resp Syncytial Virus by PCR NEGATIVE NEGATIVE Final    Comment: (NOTE) Fact Sheet for Patients: bloggercourse.com  Fact Sheet for Healthcare Providers: seriousbroker.it  This test is not yet approved or cleared by the [place]  FDA and has been authorized for detection and/or diagnosis  of SARS-CoV-2 by FDA under an Emergency Use Authorization (EUA). This EUA will remain in effect (meaning this test can be used) for the duration of the COVID-19 declaration under Section 564(b)(1) of the Act, 21 U.S.C. section 360bbb-3(b)(1), unless the authorization is terminated or revoked.  Performed at [facility], [address] [place], [place] [address]   Blood culture (routine x 2)     Status: None (Preliminary result)   Collection Time: 03/10/24  1:04 AM   Specimen: BLOOD  Result Value Ref Range Status   Specimen Description BLOOD LEFT ANTECUBITAL  Final   Special Requests   Final    BOTTLES DRAWN AEROBIC AND ANAEROBIC Blood Culture adequate volume   Culture   Final    NO GROWTH < 12 HOURS Performed at [facility], [address], [place], [place] [address]    Report Status PENDING  Incomplete         Radiology Studies: CT Head Wo Contrast Result Date: 03/10/2024 EXAM: CT HEAD WITHOUT CONTRAST 03/10/2024 01:20:16 AM TECHNIQUE: CT of the head was performed without the administration of intravenous contrast. Automated exposure control, iterative reconstruction, and/or weight based adjustment of the mA/kV was utilized to reduce the radiation dose to as low as reasonably achievable. COMPARISON: 03/08/2024 CLINICAL HISTORY: Delirium; recent SDH, AMS today w/fever. FINDINGS: BRAIN AND VENTRICLES: No acute hemorrhage. Small residual left subdural hematoma measuring 4 mm (image 23), previously 5 mm. Left basal ganglia lacunar infarct. Intracranial atherosclerosis. Subcortical and periventricular small vessel ischemic changes. No hydrocephalus. No mass effect or midline shift. ORBITS: No acute abnormality. SINUSES: No acute abnormality. SOFT TISSUES AND SKULL: No acute soft tissue abnormality. No skull fracture. IMPRESSION: 1. Small residual left subdural hematoma, mildly improved. Electronically signed by: Pinkie Pebbles MD MD 03/10/2024 01:23 AM EST RP Workstation:  [id number]   DG Chest Port 1 View Result Date: 03/10/2024 EXAM: 1 VIEW(S) XRAY OF THE CHEST 03/10/2024 01:11:45 AM COMPARISON: None available. CLINICAL HISTORY: fever, AMS FINDINGS: LINES, TUBES AND DEVICES: Left subclavian pacemaker. LUNGS AND PLEURA: Low lung volumes with mild basilar opacities, likely atelectasis. No pleural effusion. No pneumothorax. HEART AND MEDIASTINUM: Left subclavian pacemaker. No acute abnormality of the cardiac and mediastinal silhouettes. BONES AND SOFT TISSUES: No acute osseous abnormality. IMPRESSION: 1. Low lung volumes with mild basilar opacities, likely atelectasis. Electronically signed by: Pinkie Pebbles MD MD 03/10/2024 01:16 AM EST RP Workstation: [id number]        Scheduled Meds:  donepezil   10 mg Oral QHS   doxycycline   100 mg Oral BID   nitrofurantoin  (  macrocrystal-monohydrate)  100 mg Oral QPM   rosuvastatin   5 mg Oral QODAY   saccharomyces boulardii  250 mg Oral BID   Continuous Infusions:  lactated ringers  75 mL/hr at 03/10/24 0411     LOS: 0 days   Time spent:  [name] JAYSON [place], DO [facility]  If 7PM-7AM, please contact night-coverage [email or web address]  03/10/2024, 6:24 PM      "

## 2024-03-10 NOTE — H&P (Addendum)
 " History and Physical  [name] [name] FMW:[id number] DOB: 07-01-1940 DOA: 03/10/2024  Referring physician: Olam Slocumb, EDP  PCP: Wallie Drafts, NP  Outpatient Specialists: Cardiology Patient coming from: Home  Chief Complaint: Altered mental status.  HPI: [name] [name] is a 84 y.o. 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  He had a repeated CT scan the following day.  Neurosurgery recommended to hold off anticoagulation and antiplatelets for 1 week.  The patient was subsequently discharged home.  Per family members (son/daughter) he has been more somnolent and weak requiring 2 people assist for walking.  At baseline was able to walk about a mile a day.  Reportedly, per family, the patient had a fever at home of 100.8.  Has complained of left hip pain from the time he fell on 03/07/2024.  Left hip x-ray at that time showed left hip replacement with intact hardware and normal alignment with no definitive fracture.  In the ER, vital signs are stable.  The patient is somnolent but easily arouses to voices.  Noncontrast CT head revealed small residual left subdural hematoma mildly improved.  Chest x-ray nonacute.  CBC with differential, with no leukocytosis and no bandemia.  Influenza A and B, COVID-19, RSV, by PCR, all negative.    Workup in the ER was unrevealing.  EDP requesting admission for observation as the patient is not back to his baseline according to family.  Admitted by [facility], hospitalist service.  ED Course: Temperature 99.  BP 141/76, pulse 89, respiratory rate 19, O2 saturation 99% on room air.  Review of Systems: Review of systems as  noted in the HPI. All other systems reviewed and are negative.   Past Medical History:  Diagnosis Date   Alzheimer disease (HCC)    Chronic kidney disease    mild insuffiency   Coronary artery disease    LHC 4/10: Mid LAD 40-50%, then 70%, OM1 20-30%, EF 65%. Mid LAD FFR 0.89 (not hemodynamically significant). Medical therapy was continued.   Dysuria 08/10/2020   Hyperlipidemia    LBBB (left bundle branch block) 06/2018   Obstructive sleep apnea    Past Surgical History:  Procedure Laterality Date   CARDIAC CATHETERIZATION  05/02/2003   single vessel,moderate stenosis mid LAD   CARDIAC CATHETERIZATION  06/12/2008   continue med. therapy   PACEMAKER IMPLANT N/A 01/24/2024   Procedure: PACEMAKER IMPLANT;  Surgeon: Almetta Donnice LABOR, MD;  Location: [facility] INVASIVE CV LAB;  Service: Cardiovascular;  Laterality: N/A;   right shoulder surgery     TOTAL HIP ARTHROPLASTY Left 09/14/2018   Procedure: LEFT TOTAL HIP ARTHROPLASTY ANTERIOR APPROACH;  Surgeon: Vernetta Lonni GRADE, MD;  Location: [facility] ORS;  Service: Orthopedics;  Laterality: Left;    Social History:  reports that he quit smoking about 34 years ago. His smoking use included pipe. He has never used smokeless tobacco. He reports that he does not drink alcohol and does not use drugs.   Allergies[1]  Family History  Problem Relation Age of Onset   Cancer Mother    Hypertension Mother    Other Father        old age   Hypertension Father  Prior to Admission medications  Medication Sig Start Date End Date Taking? Authorizing Provider  acetaminophen  (TYLENOL ) 325 MG tablet Take 2 tablets (650 mg total) by mouth every 6 (six) hours as needed for mild pain, fever or headache. 11/22/22   Cherlyn Labella, MD  apixaban  (ELIQUIS ) 5 MG TABS tablet TAKE 1 TABLET(5 MG) BY MOUTH TWICE DAILY 12/25/23   Jordan, Peter M, MD  diclofenac  Sodium (VOLTAREN ) 1 % GEL Apply 4 g topically 4 (four) times daily. Patient taking differently: Apply 4 g  topically daily as needed (for pain). 11/22/22   Akula, Vijaya, MD  donepezil  (ARICEPT ) 10 MG tablet Take 10 mg by mouth at bedtime. 12/01/16   [provider]  doxycycline  (VIBRAMYCIN ) 100 MG capsule Take 100 mg by mouth 2 (two) times daily. 08/13/22   [provider]  fesoterodine  (TOVIAZ ) 8 MG TB24 tablet Take 8 mg by mouth daily. 12/13/18   [provider]  furosemide  (LASIX ) 20 MG tablet Take one tablet daily. You may take an extra tablet for swelling, weight gain of 2 pounds overnight or 5 pounds in one week. 09/22/23   Jordan, Peter M, MD  ketoconazole (NIZORAL) 2 % cream Apply 1 application  topically daily as needed for irritation. 02/27/21   [provider]  metroNIDAZOLE  (METROGEL ) 1 % gel Apply 1 Application topically daily.    [provider]  mirabegron  ER (MYRBETRIQ ) 50 MG TB24 tablet Take 50 mg by mouth every morning.    [provider]  nitrofurantoin , macrocrystal-monohydrate, (MACROBID ) 100 MG capsule Take 1 capsule (100 mg total) by mouth in the morning. Patient taking differently: Take 100 mg by mouth every evening. 01/23/21   Gonfa, Taye T, MD  Omega-3 Fatty Acids  (FISH OIL ) 1200 MG CAPS Take 1,200 mg by mouth 2 (two) times daily.    [provider]  PRESCRIPTION MEDICATION Inhale into the lungs at bedtime. CPAP    [provider]  rosuvastatin  (CRESTOR ) 10 MG tablet Take 0.5 tablets (5 mg total) by mouth every other day. Patient taking differently: Take 5 mg by mouth every other day. Mon wed and Fridays 09/22/23   Jordan, Peter M, MD  terbinafine (LAMISIL) 1 % cream Apply 1 Application topically daily as needed (rash).    [provider]  triamcinolone  cream (KENALOG ) 0.1 % Apply 1 Application topically 2 (two) times daily as needed (rash). 10/13/22   [provider]    Physical Exam: BP (!) 143/77   Pulse 94   Temp 99 F (37.2 C) (Oral)   Resp 20   Ht 5' 7 (1.702 m)   Wt 83 kg   SpO2  95%   BMI 28.66 kg/m   General: 84 y.o. year-old male well developed well nourished in no acute distress.  Alert and somnolent.  Easily arouses to voice. Cardiovascular: Regular rate and rhythm with no rubs or gallops.  No thyromegaly or JVD noted.  No lower extremity edema. 2/4 pulses in all 4 extremities. Respiratory: Clear to auscultation with no wheezes or rales. Good inspiratory effort. Abdomen: Soft nontender nondistended with normal bowel sounds x4 quadrants. Muskuloskeletal: No cyanosis, clubbing or edema noted bilaterally Neuro: CN II-XII intact, strength, sensation, reflexes Skin: No ulcerative lesions noted or rashes Psychiatry: Mood is appropriate for condition and setting          Labs on Admission:  Basic Metabolic Panel: Recent Labs  Lab 03/07/24 2327 03/10/24 0103  NA 141 142  K 3.9 4.3  CL 106 105  CO2 27 25  GLUCOSE 113* 137*  BUN 22 27*  CREATININE 1.17 1.23  CALCIUM  10.8* 10.8*   Liver Function Tests: Recent Labs  Lab 03/10/24 0103  AST 29  ALT 24  ALKPHOS 80  BILITOT 0.9  PROT 8.8*  ALBUMIN 4.5   No results for input(s): LIPASE, AMYLASE in the last 168 hours. No results for input(s): AMMONIA in the last 168 hours. CBC: Recent Labs  Lab 03/07/24 2327 03/10/24 0103  WBC 7.5 7.5  NEUTROABS 5.7 5.4  HGB 12.6* 14.7  HCT 37.5* 43.9  MCV 93.1 95.9  PLT 200 202   Cardiac Enzymes: No results for input(s): CKTOTAL, CKMB, CKMBINDEX, TROPONINI in the last 168 hours.  BNP (last 3 results) Recent Labs    03/17/23 1145 06/19/23 1130 06/27/23 1612  BNP 86.4 227.1* 89.2    ProBNP (last 3 results) Recent Labs    01/23/24 1247  PROBNP 239.0    CBG: No results for input(s): GLUCAP in the last 168 hours.  Radiological Exams on Admission: CT Head Wo Contrast Result Date: 03/10/2024 EXAM: CT HEAD WITHOUT CONTRAST 03/10/2024 01:20:16 AM TECHNIQUE: CT of the head was performed without the administration of intravenous  contrast. Automated exposure control, iterative reconstruction, and/or weight based adjustment of the mA/kV was utilized to reduce the radiation dose to as low as reasonably achievable. COMPARISON: 03/08/2024 CLINICAL HISTORY: Delirium; recent SDH, AMS today w/fever. FINDINGS: BRAIN AND VENTRICLES: No acute hemorrhage. Small residual left subdural hematoma measuring 4 mm (image 23), previously 5 mm. Left basal ganglia lacunar infarct. Intracranial atherosclerosis. Subcortical and periventricular small vessel ischemic changes. No hydrocephalus. No mass effect or midline shift. ORBITS: No acute abnormality. SINUSES: No acute abnormality. SOFT TISSUES AND SKULL: No acute soft tissue abnormality. No skull fracture. IMPRESSION: 1. Small residual left subdural hematoma, mildly improved. Electronically signed by: Pinkie Pebbles MD MD 03/10/2024 01:23 AM EST RP Workstation: [id number]   DG Chest Port 1 View Result Date: 03/10/2024 EXAM: 1 VIEW(S) XRAY OF THE CHEST 03/10/2024 01:11:45 AM COMPARISON: None available. CLINICAL HISTORY: fever, AMS FINDINGS: LINES, TUBES AND DEVICES: Left subclavian pacemaker. LUNGS AND PLEURA: Low lung volumes with mild basilar opacities, likely atelectasis. No pleural effusion. No pneumothorax. HEART AND MEDIASTINUM: Left subclavian pacemaker. No acute abnormality of the cardiac and mediastinal silhouettes. BONES AND SOFT TISSUES: No acute osseous abnormality. IMPRESSION: 1. Low lung volumes with mild basilar opacities, likely atelectasis. Electronically signed by: Pinkie Pebbles MD MD 03/10/2024 01:16 AM EST RP Workstation: [id number]   CT HEAD WO CONTRAST ( ) Result Date: 03/08/2024 EXAM: CT HEAD WITHOUT CONTRAST 03/08/2024 04:06:24 AM TECHNIQUE: CT of the head was performed without the administration of intravenous contrast. Automated exposure control, iterative reconstruction, and/or weight based adjustment of the mA/kV was utilized to reduce the radiation dose to as low as  reasonably achievable. COMPARISON: Head CT 03/07/2024, Brain MRI 11/17/2022. CLINICAL HISTORY: 84 year old male with left subdural hematoma after fall down stairs. FINDINGS: BRAIN AND VENTRICLES: Hyperdense left subdural hematoma appears 1 mm smaller in thickness, now 3 to 5 mm along the left lateral convexity (maximal on coronal image 48). Subtle mass effect on the left hemisphere. No midline shift. Stable gray-white differentiation. No new intracranial hemorrhage. Normal basilar cisterns. No suspicious intracranial vascular hyperdensity. Calcified atherosclerosis at the skull base. No evidence of acute infarct. No hydrocephalus. ORBITS: No acute abnormality. SINUSES: Stable mild paranasal sinus mucosal thickening. Tympanic cavities and mastoids remain well aerated. SOFT TISSUES AND SKULL: No acute soft tissue abnormality. No skull fracture.  IMPRESSION: 1. Left subdural hematoma 1 mm smaller since yesterday, now up to 5 mm. 2. No midline shift or new intracranial abnormality. Electronically signed by: Helayne Hurst MD MD 03/08/2024 04:17 AM EST RP Workstation: [id number]    EKG: I independently viewed the EKG done and my findings are as followed: Sinus rhythm rate of 94.  Nonspecific ST changes.  QTc 433.  Assessment/Plan Present on Admission:  AMS (altered mental status)  Principal Problem:   AMS (altered mental status)  Acute metabolic encephalopathy in the setting of Alzheimer's dementia and recent small subdural hematoma, POA CT scan head done today showing improvement of subdural hematoma (03/07/2024) No evidence of active infection. Reorient as needed Early mobilization Fall and aspiration precautions  Left hip pain, post fall on 03/07/2024, POA Left hip x-ray at that time showed left hip replacement with intact hardware and normal alignment with no definitive fracture. As needed analgesics  Paroxysmal A-fib on Eliquis  Eliquis  on hold x 1 week from 03/07/2024, due to recent subdural hematoma,  POA Currently rate controlled Monitor on telemetry  Generalized weakness PT OT evaluation Fall precautions.  Hyperlipidemia Resume home Crestor .  LBBB/CAD No anginal symptoms reported Resume home Crestor  DOAC and antiplatelets on hold due to subdural hematoma. Monitor on telemetry  OSA Resume home CPAP  CKD 3A Renal function is at baseline Avoid nephrotoxic agents, dehydration, and hypotension. Monitor urine output.  Chronic sialadenitis on doxycycline  and Macrobid  for at least 3 to 4 years (per son at bedside) Resume home regimen Takes probiotics in the form of yogurt at home Florastor here twice daily   Time: 75 minutes.   DVT prophylaxis: SCDs.  Code Status: Full code.  Family Communication: The patient's son at bedside.  Disposition Plan: Admitted to telemetry unit.  Consults called: None.  Admission status: Observation status.   Status is: Observation    Terry LOISE Hurst MD [facility] Pager [phone]  If 7PM-7AM, please contact night-coverage [email or web address] Password [facility]  03/10/2024, 3:47 AM      [1]  Allergies Allergen Reactions   Darifenacin Other (See Comments) and Anaphylaxis    PT STATES IT MAKES HIM BONKERS AND VERY TIRED  Other Reaction(s): Confusion, Confusion/Altered Mental Status  PT STATES IT MAKES HIM BONKERS AND VERY TIRED, Other reaction(s): Mental Status Changes (intolerance), PT STATES IT MAKES HIM BONKERS AND VERY TIRED, PT STATES IT MAKES HIM BONKERS AND VERY TIRED   Enablex [Darifenacin Hydrobromide Er]     PT STATES IT MAKES HIM BONKERS AND VERY TIRED   Atorvastatin Other (See Comments)    Muscle aches/weakness  Other reaction(s): Other (See Comments)  Muscle aches/weakness  Muscle aches/weakness  Muscle aches/weakness  Muscle aches/weakness  Muscle aches/weakness  Muscle aches/weakness  Other reaction(s): Other (See Comments), Muscle aches/weakness, Muscle aches/weakness, Muscle  aches/weakness, Muscle aches/weakness  Muscle aches/weakness    Other reaction(s): Other (See Comments) Muscle aches/weakness Muscle aches/weakness Muscle aches/weakness Muscle aches/weakness Muscle aches/weakness Muscle aches/weakness    Other reaction(s): Other (See Comments), Muscle aches/weakness, Muscle aches/weakness, Muscle aches/weakness, Muscle aches/weakness    Muscle aches/weakness  Other reaction(s): Other (See Comments) Muscle aches/weakness Muscle aches/weakness Muscle aches/weakness Muscle aches/weakness Muscle aches/weakness Muscle aches/weakness   Ceftriaxone Other (See Comments)    Unknown reaction  Other reaction(s): Other (See Comments)  Unknown reaction  Unknown reaction    Unknown reaction  Unknown reaction  Unknown reaction  Other reaction(s): Other (See Comments), Unknown reaction, Unknown reaction, , Unknown reaction  Unknown reaction    Other reaction(s): Other (See  Comments) Unknown reaction Unknown reaction  Unknown reaction Unknown reaction Unknown reaction    Other reaction(s): Other (See Comments), Unknown reaction, Unknown reaction, , Unknown reaction    Unknown reaction  Other reaction(s): Other (See Comments) Unknown reaction Unknown reaction  Unknown reaction Unknown reaction Unknown reaction   Ezetimibe Other (See Comments)    Muscle aches/weakness  Other reaction(s): Other (See Comments)  Muscle aches/weakness  Muscle aches/weakness  Muscle aches/weakness  Other reaction(s): Other (See Comments), Muscle aches/weakness, Muscle aches/weakness  Other reaction(s): Other (See Comments) Muscle aches/weakness Muscle aches/weakness Muscle aches/weakness    Other reaction(s): Other (See Comments), Muscle aches/weakness, Muscle aches/weakness    Muscle aches/weakness  Other reaction(s): Other (See Comments) Muscle aches/weakness Muscle aches/weakness Muscle aches/weakness   Metoprolol  Rash and Dermatitis   Oxybutynin Other (See Comments)     Memory impairment, temperament changes  Other Reaction(s): Confusion  Other reaction(s): Mental Status Changes (intolerance), Memory impairment, temperament changes, Memory impairment, temperament changes, Memory impairment, temperament changes, , Memory impairment, temperament changes   Sulfamethoxazole Other (See Comments)    Childhood allergy  Other reaction(s): Other (See Comments), Childhood allergy, Childhood allergy   Sulfasalazine Other (See Comments)    Childhood allergy  Other reaction(s): Other (See Comments), Unknown per daughter, Unknown per daughter   Levofloxacin Diarrhea and Other (See Comments)    Other Reaction(s): Mental Status Changes  Caused diarheer a   Sulfonamide Derivatives Other (See Comments)    Childhood allergy   "

## 2024-03-10 NOTE — ED Notes (Signed)
 Patient brought to 4E from ED. Telemetry box applied, CCMD notified. Patient A&Ox4. Daughter present. Patient oriented to room and staff. Call bell in reach.   03/10/24 1700  Vitals  Temp 97.6 F (36.4 C)  Temp Source Oral  BP 138/74  MAP (mmHg) 92  BP Location Left Arm  BP Method Automatic  Patient Position (if appropriate) Lying  Pulse Rate 70  Pulse Rate Source Monitor  ECG Heart Rate 70  Resp 19  Oxygen Therapy  SpO2 99 %  O2 Device Room Air

## 2024-03-10 NOTE — ED Triage Notes (Signed)
 The pt fell 2 days ago and today he developed a fever today he has been walking with a cane until he fell  walking with a walker  today with effort

## 2024-03-10 NOTE — ED Provider Notes (Signed)
 " [place] EMERGENCY DEPARTMENT AT [facility] Provider Note   CSN: [id number] Arrival date & time: 03/10/24  9970     Patient presents with: Fever   [name] [name] is a 84 y.o. male.   The history is provided by the patient and medical records.  Fever  Eight 5-year-old male with history of coronary artery disease, chronic kidney disease, hyperlipidemia, Alzheimer's disease, paroxysmal A-fib on Eliquis , presenting to the ED with altered mental status.  Patient was seen in the ED 03/07/2024 after a fall where he struck his head.  Was found to have a small subdural that was stable on repeat head CT several hours later and was discharged home.  Initially he was doing fine but today has been more lethargic, less interactive, not really getting up and walking.  Family reports he has been working on his stamina and was walking up to a mile each day.  He has been eating and drinking less but has not had any vomiting or diarrhea.  No sick contacts reported.  Did have a fever today up to 100.74F reportedly, temp 16F on arrival.  Per family at bedside, eliquis  was to be held for 1 week post fall which they have done.  Prior to Admission medications  Medication Sig Start Date End Date Taking? Authorizing Provider  acetaminophen  (TYLENOL ) 325 MG tablet Take 2 tablets (650 mg total) by mouth every 6 (six) hours as needed for mild pain, fever or headache. 11/22/22   Cherlyn Labella, MD  apixaban  (ELIQUIS ) 5 MG TABS tablet TAKE 1 TABLET(5 MG) BY MOUTH TWICE DAILY 12/25/23   Jordan, Peter M, MD  diclofenac  Sodium (VOLTAREN ) 1 % GEL Apply 4 g topically 4 (four) times daily. Patient taking differently: Apply 4 g topically daily as needed (for pain). 11/22/22   Akula, Vijaya, MD  donepezil  (ARICEPT ) 10 MG tablet Take 10 mg by mouth at bedtime. 12/01/16   [provider]  doxycycline  (VIBRAMYCIN ) 100 MG capsule Take 100 mg by mouth 2 (two) times daily. 08/13/22   [provider]  fesoterodine   (TOVIAZ ) 8 MG TB24 tablet Take 8 mg by mouth daily. 12/13/18   [provider]  furosemide  (LASIX ) 20 MG tablet Take one tablet daily. You may take an extra tablet for swelling, weight gain of 2 pounds overnight or 5 pounds in one week. 09/22/23   Jordan, Peter M, MD  ketoconazole (NIZORAL) 2 % cream Apply 1 application  topically daily as needed for irritation. 02/27/21   [provider]  metroNIDAZOLE  (METROGEL ) 1 % gel Apply 1 Application topically daily.    [provider]  mirabegron  ER (MYRBETRIQ ) 50 MG TB24 tablet Take 50 mg by mouth every morning.    [provider]  nitrofurantoin , macrocrystal-monohydrate, (MACROBID ) 100 MG capsule Take 1 capsule (100 mg total) by mouth in the morning. Patient taking differently: Take 100 mg by mouth every evening. 01/23/21   Gonfa, Taye T, MD  Omega-3 Fatty Acids  (FISH OIL ) 1200 MG CAPS Take 1,200 mg by mouth 2 (two) times daily.    [provider]  PRESCRIPTION MEDICATION Inhale into the lungs at bedtime. CPAP    [provider]  rosuvastatin  (CRESTOR ) 10 MG tablet Take 0.5 tablets (5 mg total) by mouth every other day. Patient taking differently: Take 5 mg by mouth every other day. Mon wed and Fridays 09/22/23   Jordan, Peter M, MD  terbinafine (LAMISIL) 1 % cream Apply 1 Application topically daily as needed (rash).  [provider]  triamcinolone  cream (KENALOG ) 0.1 % Apply 1 Application topically 2 (two) times daily as needed (rash). 10/13/22   [provider]    Allergies: Darifenacin, Enablex [darifenacin hydrobromide er], Atorvastatin, Ceftriaxone, Ezetimibe, Metoprolol , Oxybutynin, Sulfamethoxazole, Sulfasalazine, Levofloxacin, and Sulfonamide derivatives    Review of Systems  Unable to perform ROS: Other    Updated Vital Signs BP (!) 174/78 (BP Location: Right Arm)   Pulse 97   Temp 99 F (37.2 C) (Oral)   Resp (!) 22   Ht 5' 7 (1.702 m)   Wt 83 kg   SpO2 99%    BMI 28.66 kg/m   Physical Exam Vitals and nursing note reviewed.  Constitutional:      Appearance: He is well-developed.  HENT:     Head: Normocephalic and atraumatic.  Eyes:     Conjunctiva/sclera: Conjunctivae normal.     Pupils: Pupils are equal, round, and reactive to light.  Cardiovascular:     Rate and Rhythm: Normal rate and regular rhythm.     Heart sounds: Normal heart sounds.  Pulmonary:     Effort: Pulmonary effort is normal. No respiratory distress.     Breath sounds: Normal breath sounds. No rhonchi.  Abdominal:     General: Bowel sounds are normal.     Palpations: Abdomen is soft.  Musculoskeletal:        General: Normal range of motion.     Cervical back: Normal range of motion.  Skin:    General: Skin is warm and dry.  Neurological:     Mental Status: He is alert.     Comments: A little somnolent but arouses to voice and able to answer simple questions and follow commands, weak with standing from wheelchair but able to stand and pivot with 2 person assist     (all labs ordered are listed, but only abnormal results are displayed) Labs Reviewed  COMPREHENSIVE METABOLIC PANEL WITH GFR - Abnormal; Notable for the following components:      Result Value   Glucose, Bld 137 (*)    BUN 27 (*)    Calcium  10.8 (*)    Total Protein 8.8 (*)    GFR, Estimated 58 (*)    All other components within normal limits  RESP PANEL BY RT-PCR (RSV, FLU A&B, COVID)  RVPGX2  CULTURE, BLOOD (ROUTINE X 2)  CULTURE, BLOOD (ROUTINE X 2)  CBC WITH DIFFERENTIAL/PLATELET  URINALYSIS, W/ REFLEX TO CULTURE (INFECTION SUSPECTED)  I-STAT CG4 LACTIC ACID, ED    EKG: None  Radiology: CT HEAD WO CONTRAST ( ) Result Date: 03/08/2024 EXAM: CT HEAD WITHOUT CONTRAST 03/08/2024 04:06:24 AM TECHNIQUE: CT of the head was performed without the administration of intravenous contrast. Automated exposure control, iterative reconstruction, and/or weight based adjustment of the mA/kV was utilized  to reduce the radiation dose to as low as reasonably achievable. COMPARISON: Head CT 03/07/2024, Brain MRI 11/17/2022. CLINICAL HISTORY: 84 year old male with left subdural hematoma after fall down stairs. FINDINGS: BRAIN AND VENTRICLES: Hyperdense left subdural hematoma appears 1 mm smaller in thickness, now 3 to 5 mm along the left lateral convexity (maximal on coronal image 48). Subtle mass effect on the left hemisphere. No midline shift. Stable gray-white differentiation. No new intracranial hemorrhage. Normal basilar cisterns. No suspicious intracranial vascular hyperdensity. Calcified atherosclerosis at the skull base. No evidence of acute infarct. No hydrocephalus. ORBITS: No acute abnormality. SINUSES: Stable mild paranasal sinus mucosal thickening. Tympanic cavities and mastoids remain well aerated. SOFT TISSUES AND SKULL:  No acute soft tissue abnormality. No skull fracture. IMPRESSION: 1. Left subdural hematoma 1 mm smaller since yesterday, now up to 5 mm. 2. No midline shift or new intracranial abnormality. Electronically signed by: Helayne Hurst MD MD 03/08/2024 04:17 AM EST RP Workstation: [id number]     Procedures   Medications Ordered in the ED - No data to display                                  Medical Decision Making Amount and/or Complexity of Data Reviewed Labs: ordered. Radiology: ordered and independent interpretation performed. ECG/medicine tests: ordered and independent interpretation performed.  Risk Decision regarding hospitalization.   84 y.o. M here with AMS.  Recent SDH after fall 03/07/24.  Today has been more lethargic, not really getting up, family reported fever but afebrile here.    He is awake/alert.  He is a little somnolent but responds to voice and follows commands.  He does not have any focal deficits currently.  Will plan to repeat head CT given recent SDH (eliquis  currently being held x1 week), labs, CXR, UA, RVP.  Labs as above-- normal lactate, normal  WBC count.  No significant electrolyte derangement.  Chest x-ray is clear.  CT head with decreasing subdural, no new bleed or shift noted.  UA without signs of infection either.  Blood cultures have been sent.  Currently, remains without fever and no clear source of infection identified.  Question if this is related to the SDH. As patient is not at baseline, feel he warrants close hospitalization observation.  Family is agreeable.  Discussed with hospitalist, Dr. Hurst-- will admit for ongoing care.  Final diagnoses:  Subdural hematoma (HCC)  Altered mental status, unspecified altered mental status type    ED Discharge Orders     None          Jarold Olam HERO, PA-C 03/10/24 0414    Lorette Mayo, MD 03/10/24 [phone]  "

## 2024-03-10 NOTE — Plan of Care (Signed)
" °  Problem: Education: Goal: Knowledge of General Education information will improve Description: Including pain rating scale, medication(s)/side effects and non-pharmacologic comfort measures Outcome: Progressing   Problem: Health Behavior/Discharge Planning: Goal: Ability to manage health-related needs will improve Outcome: Progressing   Problem: Clinical Measurements: Goal: Will remain free from infection Outcome: Progressing Goal: Cardiovascular complication will be avoided Outcome: Progressing   Problem: Activity: Goal: Risk for activity intolerance will decrease Outcome: Progressing   Problem: Pain Managment: Goal: General experience of comfort will improve and/or be controlled Outcome: Progressing   "

## 2024-03-10 NOTE — Progress Notes (Signed)
" °   03/10/24 2357  BiPAP/CPAP/SIPAP  $ Non-Invasive Home Ventilator  Initial  $ Face Mask Medium Yes  BiPAP/CPAP/SIPAP Pt Type Adult  BiPAP/CPAP/SIPAP Resmed  Mask Type Full face mask  Mask Size Medium  Respiratory Rate 18 breaths/min  PEEP 5 cmH20  Patient Home Machine No  Patient Home Mask No  Patient Home Tubing No  Auto Titrate No  Device Plugged into RED Power Outlet Yes    "

## 2024-03-10 NOTE — Evaluation (Signed)
 Occupational Therapy Evaluation Patient Details Name: [name] [name] MRN: [id number] DOB: 04-20-1940 Today's Date: 03/10/2024   History of Present Illness   84 y.o. male presents to [facility] 03/10/24 with AMS and progressive weakness. Workup unrevealing. Prior admit and d/c home 1/8 after a fall with small SDH. CT scan 1/11 showed improvement of SDH. PMHx: Alzheimer's dementia, OSA on CPAP, CKD 3A, symptomatic bradycardia status post pacemaker placement, chronic HFpEF, LBBB, CAD, paroxysmal A-fib on Eliquis , chronic sialadenitis     Clinical Impressions PTA, pt living with son and daughter per his report and was mod I for dressing, S for bathing, and needing assist with shower transfers. Upon eval, pt questionable historian as he does not provide any report of the reported +2 assist he was needing at home after recent fall and admission 1/8. Upon eval, pt limited by L hip pain, decreased cognition, safety awareness, and difficulty advancing LLE during transfers. Pt currently +2 min A for basic STS transfers as well as needing up to max A for BADL. Patient will benefit from continued inpatient follow up therapy, <3 hours/day      If plan is discharge home, recommend the following:   Two people to help with walking and/or transfers;A lot of help with bathing/dressing/bathroom;Two people to help with bathing/dressing/bathroom;Assist for transportation;Help with stairs or ramp for entrance     Functional Status Assessment   Patient has had a recent decline in their functional status and demonstrates the ability to make significant improvements in function in a reasonable and predictable amount of time.     Equipment Recommendations   Other (comment) (defer)     Recommendations for Other Services         Precautions/Restrictions   Precautions Precautions: Fall Recall of Precautions/Restrictions: Impaired Restrictions Weight Bearing Restrictions Per Provider Order: No      Mobility Bed Mobility Overal bed mobility: Needs Assistance Bed Mobility: Supine to Sit, Sit to Supine     Supine to sit: Mod assist, +2 for physical assistance, +2 for safety/equipment Sit to supine: Mod assist, +2 for safety/equipment   General bed mobility comments: assist to bring LLE off EOB and to raise trunk via 1HH. Assist to return to supine for LLE managment and to shift shoulders    Transfers Overall transfer level: Needs assistance Equipment used: Rolling walker (2 wheels) Transfers: Sit to/from Stand Sit to Stand: Min assist, +2 physical assistance, +2 safety/equipment           General transfer comment: MinAx2 to boost-up with use of RW. Attempted to take steps, however, pt unable due to pain in L hip. Declined further attempts with desire to return to seated position. Needed assist to shift hips for proper alignement when returning to seated position      Balance Overall balance assessment: Needs assistance, Mild deficits observed, not formally tested, History of Falls Sitting-balance support: Feet supported, No upper extremity supported Sitting balance-[name] Scale: Fair     Standing balance support: Bilateral upper extremity supported, During functional activity, Reliant on assistive device for balance Standing balance-[name] Scale: Poor Standing balance comment: reliant on UE and external support                           ADL either performed or assessed with clinical judgement   ADL Overall ADL's : Needs assistance/impaired Eating/Feeding: Set up;Sitting;Supervision/ safety   Grooming: Set up;Supervision/safety;Sitting   Upper Body Bathing: Contact guard assist;Sitting   Lower Body Bathing:  Moderate assistance;Maximal assistance;Sit to/from stand   Upper Body Dressing : Contact guard assist;Sitting   Lower Body Dressing: Moderate assistance;Maximal assistance;Sit to/from stand   Toilet Transfer: Minimal assistance;+2 for physical  assistance;+2 for safety/equipment Toilet Transfer Details (indicate cue type and reason): STS; OT progressed pt LLE to take steps L to [facility], but pt brigs leg back toward R before weight shifting limiting progression         Functional mobility during ADLs: Minimal assistance;+2 for safety/equipment;+2 for physical assistance       Vision Baseline Vision/History: 1 Wears glasses Patient Visual Report: No change from baseline Additional Comments: denies changes; not fornally assessed this session     Perception Perception: Not tested       Praxis Praxis: Not tested       Pertinent Vitals/Pain Pain Assessment Pain Assessment: Faces Faces Pain Scale: Hurts even more Pain Location: L hip when attempting to step Pain Descriptors / Indicators: Discomfort, Grimacing, Guarding, Crushing Pain Intervention(s): Limited activity within patient's tolerance, Monitored during session     Extremity/Trunk Assessment Upper Extremity Assessment Upper Extremity Assessment: Generalized weakness   Lower Extremity Assessment Lower Extremity Assessment: Defer to PT evaluation LLE Deficits / Details: Difficult to assess due to pain, at least 3/5 with pt able to bear weight with no evidence of buckling. Pain with hip ABD and attemps to step LLE: Unable to fully assess due to pain LLE Sensation: WNL   Cervical / Trunk Assessment Cervical / Trunk Assessment: Normal   Communication Communication Communication: Impaired Factors Affecting Communication: Hearing impaired   Cognition Arousal: Alert Behavior During Therapy: WFL for tasks assessed/performed Cognition: History of cognitive impairments, Cognition impaired     Awareness: Intellectual awareness impaired Memory impairment (select all impairments): Working civil service fast streamer, Programmer, systems, Engineer, structural memory Attention impairment (select first level of impairment): Sustained attention Executive functioning impairment (select all  impairments): Sequencing, Organization, Problem solving, Reasoning (pt states he does not know why he cannot sit EOB without S despite being unable to bring Les back into bed without assist) OT - Cognition Comments: follwos one step commands. hard of hearing impacting command following. loosely oriented.                 Following commands: Impaired Following commands impaired: Follows multi-step commands inconsistently, Follows one step commands with increased time     Cueing  General Comments   Cueing Techniques: Verbal cues;Tactile cues      Exercises     Shoulder Instructions      Home Living Family/patient expects to be discharged to:: Private residence Living Arrangements: Children (son and daughter) Available Help at Discharge: Family Type of Home: House Home Access: Level entry     Home Layout: One level     Bathroom Shower/Tub: Tub/shower unit;Walk-in shower (uses tub/shower)         Home Equipment: Agricultural Consultant (2 wheels)   Additional Comments: Unsure if acurate home set-up as pt questionable historian and with no reference to how things went after discharge home 1/8      Prior Functioning/Environment Prior Level of Function : Patient poor historian/Family not available;Needs assist;History of Falls (last six months)             Mobility Comments: Prior to fall 1/8, pt was ModI with RW. Has been needing +2 assist to transfer from family at home. Has been sleeping in a recliner ADLs Comments: Son close by when pt bathes, otherwise pt reports being independent until fall. Pt did  not specify any IADL he does    OT Problem List: Decreased strength;Decreased activity tolerance;Impaired balance (sitting and/or standing);Decreased cognition;Decreased safety awareness;Decreased knowledge of precautions;Pain   OT Treatment/Interventions: Self-care/ADL training;Therapeutic exercise;DME and/or AE instruction;Therapeutic activities;Patient/family  education;Balance training;Cognitive remediation/compensation      OT Goals(Current goals can be found in the care plan section)   Acute Rehab OT Goals Patient Stated Goal: get better OT Goal Formulation: With patient Time For Goal Achievement: 03/24/24 Potential to Achieve Goals: Good   OT Frequency:  Min 2X/week    Co-evaluation PT/OT/SLP Co-Evaluation/Treatment: Yes Reason for Co-Treatment: For patient/therapist safety;To address functional/ADL transfers PT goals addressed during session: Mobility/safety with mobility;Balance;Proper use of DME OT goals addressed during session: ADL's and self-care      AM-PAC OT 6 Clicks Daily Activity     Outcome Measure Help from another person eating meals?: A Little Help from another person taking care of personal grooming?: A Little Help from another person toileting, which includes using toliet, bedpan, or urinal?: A Lot Help from another person bathing (including washing, rinsing, drying)?: A Lot Help from another person to put on and taking off regular upper body clothing?: A Little Help from another person to put on and taking off regular lower body clothing?: A Lot 6 Click Score: 15   End of Session Equipment Utilized During Treatment: Gait belt;Rolling walker (2 wheels) Nurse Communication: Mobility status  Activity Tolerance: Patient tolerated treatment well;Patient limited by pain Patient left: in bed;with call bell/phone within reach  OT Visit Diagnosis: Unsteadiness on feet (R26.81);Muscle weakness (generalized) (M62.81);Other symptoms and signs involving cognitive function;History of falling (Z91.81);Pain Pain - Right/Left: Left Pain - part of body: Hip                Time: 9183-9163 OT Time Calculation (min): 20 min Charges:  OT General Charges $OT Visit: 1 Visit OT Evaluation $OT Eval Moderate Complexity: 1 Mod  Elma JONETTA Lebron FREDERICK, OTR/L [facility] Acute Rehabilitation Office: [phone]   Elma JONETTA Lebron 03/10/2024, 9:51 AM

## 2024-03-11 DIAGNOSIS — I10 Essential (primary) hypertension: Secondary | ICD-10-CM

## 2024-03-11 DIAGNOSIS — Z96642 Presence of left artificial hip joint: Secondary | ICD-10-CM | POA: Diagnosis present

## 2024-03-11 DIAGNOSIS — S065X0D Traumatic subdural hemorrhage without loss of consciousness, subsequent encounter: Secondary | ICD-10-CM | POA: Diagnosis not present

## 2024-03-11 DIAGNOSIS — G4733 Obstructive sleep apnea (adult) (pediatric): Secondary | ICD-10-CM

## 2024-03-11 DIAGNOSIS — F02818 Dementia in other diseases classified elsewhere, unspecified severity, with other behavioral disturbance: Secondary | ICD-10-CM | POA: Diagnosis present

## 2024-03-11 DIAGNOSIS — Z9181 History of falling: Secondary | ICD-10-CM | POA: Diagnosis not present

## 2024-03-11 DIAGNOSIS — I959 Hypotension, unspecified: Secondary | ICD-10-CM | POA: Diagnosis present

## 2024-03-11 DIAGNOSIS — L89151 Pressure ulcer of sacral region, stage 1: Secondary | ICD-10-CM | POA: Diagnosis present

## 2024-03-11 DIAGNOSIS — R531 Weakness: Secondary | ICD-10-CM | POA: Diagnosis present

## 2024-03-11 DIAGNOSIS — K1123 Chronic sialoadenitis: Secondary | ICD-10-CM | POA: Diagnosis present

## 2024-03-11 DIAGNOSIS — I5032 Chronic diastolic (congestive) heart failure: Secondary | ICD-10-CM

## 2024-03-11 DIAGNOSIS — I25119 Atherosclerotic heart disease of native coronary artery with unspecified angina pectoris: Secondary | ICD-10-CM | POA: Diagnosis not present

## 2024-03-11 DIAGNOSIS — I2510 Atherosclerotic heart disease of native coronary artery without angina pectoris: Secondary | ICD-10-CM | POA: Diagnosis present

## 2024-03-11 DIAGNOSIS — Z7901 Long term (current) use of anticoagulants: Secondary | ICD-10-CM | POA: Diagnosis not present

## 2024-03-11 DIAGNOSIS — L89159 Pressure ulcer of sacral region, unspecified stage: Secondary | ICD-10-CM | POA: Diagnosis not present

## 2024-03-11 DIAGNOSIS — E785 Hyperlipidemia, unspecified: Secondary | ICD-10-CM

## 2024-03-11 DIAGNOSIS — W109XXA Fall (on) (from) unspecified stairs and steps, initial encounter: Secondary | ICD-10-CM | POA: Diagnosis present

## 2024-03-11 DIAGNOSIS — R509 Fever, unspecified: Secondary | ICD-10-CM | POA: Diagnosis present

## 2024-03-11 DIAGNOSIS — Z1152 Encounter for screening for COVID-19: Secondary | ICD-10-CM | POA: Diagnosis not present

## 2024-03-11 DIAGNOSIS — F03918 Unspecified dementia, unspecified severity, with other behavioral disturbance: Secondary | ICD-10-CM | POA: Diagnosis not present

## 2024-03-11 DIAGNOSIS — F0280 Dementia in other diseases classified elsewhere without behavioral disturbance: Secondary | ICD-10-CM | POA: Diagnosis not present

## 2024-03-11 DIAGNOSIS — M25552 Pain in left hip: Secondary | ICD-10-CM | POA: Diagnosis present

## 2024-03-11 DIAGNOSIS — R262 Difficulty in walking, not elsewhere classified: Secondary | ICD-10-CM | POA: Diagnosis present

## 2024-03-11 DIAGNOSIS — I130 Hypertensive heart and chronic kidney disease with heart failure and stage 1 through stage 4 chronic kidney disease, or unspecified chronic kidney disease: Secondary | ICD-10-CM | POA: Diagnosis present

## 2024-03-11 DIAGNOSIS — I447 Left bundle-branch block, unspecified: Secondary | ICD-10-CM | POA: Diagnosis present

## 2024-03-11 DIAGNOSIS — R001 Bradycardia, unspecified: Secondary | ICD-10-CM | POA: Diagnosis present

## 2024-03-11 DIAGNOSIS — G309 Alzheimer's disease, unspecified: Secondary | ICD-10-CM | POA: Diagnosis present

## 2024-03-11 DIAGNOSIS — I480 Paroxysmal atrial fibrillation: Secondary | ICD-10-CM | POA: Diagnosis present

## 2024-03-11 DIAGNOSIS — Z8249 Family history of ischemic heart disease and other diseases of the circulatory system: Secondary | ICD-10-CM | POA: Diagnosis not present

## 2024-03-11 DIAGNOSIS — S065XAA Traumatic subdural hemorrhage with loss of consciousness status unknown, initial encounter: Secondary | ICD-10-CM | POA: Diagnosis present

## 2024-03-11 DIAGNOSIS — R4182 Altered mental status, unspecified: Secondary | ICD-10-CM | POA: Diagnosis present

## 2024-03-11 DIAGNOSIS — N1831 Chronic kidney disease, stage 3a: Secondary | ICD-10-CM | POA: Diagnosis present

## 2024-03-11 LAB — BASIC METABOLIC PANEL WITH GFR
Anion gap: 8 (ref 5–15)
BUN: 24 mg/dL — ABNORMAL HIGH (ref 8–23)
CO2: 25 mmol/L (ref 22–32)
Calcium: 9.7 mg/dL (ref 8.9–10.3)
Chloride: 107 mmol/L (ref 98–111)
Creatinine, Ser: 0.96 mg/dL (ref 0.61–1.24)
GFR, Estimated: >60 mL/min
Glucose, Bld: 106 mg/dL — ABNORMAL HIGH (ref 70–99)
Potassium: 3.8 mmol/L (ref 3.5–5.1)
Sodium: 140 mmol/L (ref 135–145)

## 2024-03-11 NOTE — Care Management Obs Status (Signed)
 MEDICARE OBSERVATION STATUS NOTIFICATION   Patient Details  Name: [name] [name] MRN: [id number] Date of Birth: September 20, 1940   Medicare Observation Status Notification Given:  Yes    Vonzell Arrie Sharps 03/11/2024, 9:11 AM

## 2024-03-11 NOTE — TOC Initial Note (Signed)
 Transition of Care [facility]) - Initial/Assessment Note    Patient Details  Name: [name] [name] MRN: [id number] Date of Birth: 10-12-1940  Transition of Care [facility]) CM/SW Contact:    Montie LOISE Louder, LCSW Phone Number: 03/11/2024, 4:13 PM  Clinical Narrative:                  CSW met with patient and his daughter,[name] at bedside. CSW introduced self and explained role. Patient is in the home with his daughter. She reports his son,Marty also assist with patient's care. CSW explained the recommendation is for short term rehab at [facility]. However,  patient must meet 3 midnight inpatient stay criteria for Medicare to cover (meaning must be a medical reason for him to remain in the hospital). If patient is medically stable prior to he will have to d/c home w/ HH. She states understanding. CSW explained the SNF process. All questions answered.   TOC will continue to follow and assist with discharge planning.   Montie Louder, MSW, LCSW Clinical Social Worker     Barriers to Discharge: Continued Medical Work up   Patient Goals and CMS Choice            Expected Discharge Plan and Services In-house Referral: Clinical Social Work                                            Prior Living Arrangements/Services   Lives with:: Self, Adult Children Patient language and need for interpreter reviewed:: No        Need for Family Participation in Patient Care: Yes (Comment) Care giver support system in place?: Yes (comment)   Criminal Activity/Legal Involvement Pertinent to Current Situation/Hospitalization: No - Comment as needed  Activities of Daily Living      Permission Sought/Granted      Share Information with NAME: Kohlton Gilpatrick  Permission granted to share info w AGENCY: SNFs  Permission granted to share info w Relationship: daughter  Permission granted to share info w Contact Information: [phone]  Emotional Assessment       Orientation: : Oriented  to Self, Oriented to Place, Oriented to  Time, Oriented to Situation Alcohol / Substance Use: Not Applicable Psych Involvement: No (comment)  Admission diagnosis:  Subdural hematoma (HCC) [S06.5XAA] Altered mental status, unspecified altered mental status type [R41.82] AMS (altered mental status) [R41.82] Patient Active Problem List   Diagnosis Date Noted   AMS (altered mental status) 03/10/2024   Pressure injury of skin 03/10/2024   Closed TBI (traumatic brain injury) (HCC) 03/10/2024   Subdural hematoma, post-traumatic (HCC) 03/10/2024   Bradycardia 01/23/2024   Asymptomatic bacteriuria 04/06/2021   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   Lactic acidosis 04/02/2021   Paroxysmal atrial fibrillation (HCC) 04/02/2021   SIRS (systemic inflammatory response syndrome) (HCC) 04/02/2021   Acute respiratory distress 04/02/2021   Dementia with behavioral disturbance (HCC) 04/02/2021   Sepsis (HCC) 01/08/2021   Neck infection 01/08/2021   Encephalopathy due to COVID-19 virus 01/08/2021   Transaminitis 01/08/2021   Sepsis secondary to UTI (HCC) 09/11/2020   Dysuria 08/10/2020   Acute parotitis 07/27/2020   Facial cellulitis 07/26/2020   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   Primary osteoarthritis of left hip 09/06/2018   Alzheimer's dementia (HCC) 12/07/2016   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   Venous stasis dermatitis of  left lower extremity 04/07/2016   Bilateral impacted cerumen 01/18/2016   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   Unsteadiness 01/18/2016   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   Cellulitis of left lower leg    Left leg swelling    Cellulitis 08/23/2014   Chronic heart failure with preserved ejection fraction (HFpEF, >= 50%) (HCC) 04/29/2014   DOE (dyspnea on exertion) 11/12/2011   Stage 3a chronic kidney disease (HCC)    Thrombocytopenia, unspecified 07/29/2010   Vitamin D  deficiency 05/19/2010   Depression, major, recurrent, 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   Urinary urgency 05/19/2010   Vertigo, 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   Benign essential hypertension 12/23/2009   Actinic keratosis 12/23/2009   Seborrheic keratosis 03/18/2009   Benign neoplasm 03/18/2009   Hyperlipidemia 01/18/2008   OSA on CPAP 01/18/2008   Coronary atherosclerosis 01/18/2008   Atherosclerotic heart disease of native coronary artery without angina pectoris 01/18/2008   Cataract in degenerative disorder 11/21/2006   Unspecified protein-calorie malnutrition 09/27/2006   Gastro-esophageal reflux disease with esophagitis 09/15/2006   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) 05/11/2005   CAD (nonobstructive CAD seen on LHC 2010 ) 05/11/2005   PCP:  Wallie Drafts, NP Pharmacy:   [facility] [id number] - [place], [place] - [address] [place] [address] Phone: [phone] Fax: [phone]     Social Drivers of Health (SDOH) Social History: SDOH Screenings   Food Insecurity: No Food Insecurity (03/10/2024)  Housing: Low Risk (03/10/2024)  Transportation Needs: No Transportation Needs (03/10/2024)  Utilities: Not At Risk (03/10/2024)  Financial Resource Strain: Low Risk (10/11/2023)   Received from [facility]  Physical Activity: Inactive (10/11/2023)   Received from [facility]  Social Connections: Moderately Integrated (03/10/2024)  Stress: No Stress Concern Present (10/11/2023)   Received from [facility]  Tobacco Use: Medium Risk (03/10/2024)   SDOH Interventions:     Readmission Risk Interventions    01/24/2024    2:41 PM  Readmission Risk Prevention Plan  Post Dischage Appt Complete  Medication Screening Complete  Transportation Screening Complete

## 2024-03-11 NOTE — TOC Progression Note (Signed)
 Transition of Care [facility]) - Progression Note    Patient Details  Name: [name] [name] MRN: [id number] Date of Birth: 18-May-1940  [facility]) CM/SW Contact  Montie LOISE Louder, [place] Phone Number: 03/11/2024, 8:55 AM  Clinical Narrative:     CSW acknowledge consult for SNF- patient is observation status  and does not meet the requirement for Medicare to cover short term rehab at SNF at this time.   TOC will continue to follow and assist with discharge planning.   Montie Louder, MSW, LCSW Clinical Social Worker                      Expected Discharge Plan and Services                                               Social Drivers of Health (SDOH) Interventions SDOH Screenings   Food Insecurity: No Food Insecurity (03/10/2024)  Housing: Low Risk (03/10/2024)  Transportation Needs: No Transportation Needs (03/10/2024)  Utilities: Not At Risk (03/10/2024)  Financial Resource Strain: Low Risk (10/11/2023)   Received from [facility]  Physical Activity: Inactive (10/11/2023)   [facility]  Social Connections: Moderately Integrated (03/10/2024)  Stress: No Stress Concern Present (10/11/2023)   Received from [facility]  Tobacco Use: Medium Risk (03/10/2024)    Readmission Risk Interventions    01/24/2024    2:41 PM  Readmission Risk Prevention Plan  Post Dischage Appt Complete  Medication Screening Complete  Transportation Screening Complete

## 2024-03-11 NOTE — Progress Notes (Signed)
 Remote PPM Transmission

## 2024-03-11 NOTE — Progress Notes (Signed)
" °   03/11/24 2246  BiPAP/CPAP/SIPAP  $ Non-Invasive Home Ventilator  Subsequent  BiPAP/CPAP/SIPAP Pt Type Adult  BiPAP/CPAP/SIPAP Resmed  Mask Type Full face mask  Mask Size Medium  Respiratory Rate 16 breaths/min  PEEP 5 cmH20  Patient Home Machine No  Patient Home Mask No  Patient Home Tubing No  Auto Titrate No  Device Plugged into RED Power Outlet Yes    "

## 2024-03-11 NOTE — Progress Notes (Signed)
 " PROGRESS NOTE    [name]  FMW:[id number] DOB: September 09, 1940 DOA: 03/10/2024 PCP: Wallie Drafts, NP   Brief Narrative:  [name] [name] is a 84 y.o. 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  He had a repeated CT scan the following day.  Neurosurgery recommended to hold off anticoagulation and antiplatelets for 1 week.  The patient was subsequently discharged home.  Per family members (son/daughter) he has been more somnolent and weak requiring 2 people assist for walking since his fall -patient previously able to walk upwards of 1 mile per day independently.  He does require some assistance at home with ADLs but is mostly independent per discussion with daughter [name].  Assessment & Plan:   Principal Problem:   Subdural hematoma, post-traumatic (HCC) Active Problems:   Closed TBI (traumatic brain injury) (HCC)   Hyperlipidemia   OSA on CPAP   Coronary atherosclerosis   Benign essential hypertension   Chronic heart failure with preserved ejection fraction (HFpEF, >= 50%) (HCC)   CAD (nonobstructive CAD seen on LHC 2010 )   Alzheimer's dementia (HCC)   Dementia with behavioral disturbance (HCC)   Pressure injury of skin  Somnolence, weakness Traumatic brain injury, POA Subdural hematoma, improving, POA - Likely secondary to recent TBI, patient has had profoundly increased somnolence and requiring additional assistance from prior baseline after prior TBI and fall with noted subdural hematoma. - PT OT following, lengthy discussion with family, daughter [name] BROTHERS, and we discussed his likely increased needs in the interim post trauma.   Pending PT and OT recommendations patient may benefit from a short stay at skilled nursing facility versus increased therapy at home. -Exacerbated by chronic Alzheimer's dementia without behavioral disturbances -continue home meds - Patient remains a high fall risk given history with new weakness  Stable chronic comorbid conditions: Chronic essential hypertension Hyperlipidemia OSA on CPAP CAD, nonobstructive Pressure injury, POA: Wound 03/10/24 1729 Pressure Injury Sacrum Medial Stage 1 -  Intact skin with non-blanchable redness of a localized area usually over a bony prominence. (Active)   - Continue home medications as below - Daughter indicates patient has profoundly sensitive blood pressure, noted to have profound hypotension with even small dose antihypertensives, as such we will avoid any as needed medications at this time unless profoundly elevated   DVT prophylaxis: SCDs Start: 03/10/24 0332 hold anticoagulation secondary to previous subdural hematoma per neurosurgery recommendations Code Status:   Code Status: Full Code Family Communication: Daughter [name] updated over the phone  Status is:  observation  Dispo: The patient is from: Home              Anticipated d/c is to: To be determined              Anticipated d/c date is: To be determined              Patient currently not medically stable for discharge  Consultants:  None  Procedures:  None  Antimicrobials:  None indicated  Subjective: No acute issues or events overnight, patient feels markedly improved over the past 24 hours, continues to evaluate PT hoping for discharge home but understands he may need discharge to  facility  Objective: Vitals:   03/10/24 2123 03/10/24 2335 03/10/24 2357 03/11/24 0415  BP: 138/68 133/75  127/65  Pulse:  76 79 66  Resp:  18  18  Temp:  98.3 F (36.8 C)  97.8 F (36.6 C)  TempSrc:  Oral  Oral  SpO2:  98% 99% 98%  Weight:      Height:        Intake/Output Summary  (Last 24 hours) at 03/11/2024 0733 Last data filed at 03/10/2024 1800 Gross per 24 hour  Intake 1035.26 ml  Output --  Net 1035.26 ml   Filed Weights   03/10/24 0046  Weight: 83 kg    Examination:  General:  Pleasantly resting in bed, No acute distress. HEENT:  Normocephalic atraumatic.  Sclerae nonicteric, noninjected.  Extraocular movements intact bilaterally. Neck:  Without mass or deformity.  Trachea is midline. Lungs:  Clear to auscultate bilaterally without rhonchi, wheeze, or rales. Heart:  Regular rate and rhythm.  Without murmurs, rubs, or gallops. Abdomen:  Soft, nontender, nondistended.  Without guarding or rebound. Extremities: Without cyanosis, clubbing, edema, or obvious deformity.  Pointed tenderness at the left lateral femoral head Skin:  Warm and dry, no erythema.  Data Reviewed: I have personally reviewed following labs and imaging studies  CBC: Recent Labs  Lab 03/07/24 2327 03/10/24 0103  WBC 7.5 7.5  NEUTROABS 5.7 5.4  HGB 12.6* 14.7  HCT 37.5* 43.9  MCV 93.1 95.9  PLT 200 202   Basic Metabolic Panel: Recent Labs  Lab 03/07/24 2327 03/10/24 0103 03/11/24 0259  NA 141 142 140  K 3.9 4.3 3.8  CL 106 105 107  CO2 27 25 25   GLUCOSE 113* 137* 106*  BUN 22 27* 24*  CREATININE 1.17 1.23 0.96  CALCIUM  10.8* 10.8* 9.7   GFR: Estimated Creatinine Clearance: 58 mL/min (by C-G formula based on SCr of 0.96 mg/dL). Liver Function Tests: Recent Labs  Lab 03/10/24 0103  AST 29  ALT 24  ALKPHOS 80  BILITOT 0.9  PROT 8.8*  ALBUMIN 4.5   BNP (last 3 results) Recent Labs    01/23/24 1247  PROBNP 239.0   Sepsis Labs: Recent Labs  Lab 03/10/24 0115  LATICACIDVEN 1.6    Recent Results (from the past 240 hours)  Blood culture (routine x 2)     Status: None (Preliminary result)   Collection Time: 03/10/24 12:59 AM   Specimen: BLOOD LEFT FOREARM  Result Value Ref Range Status   Specimen Description BLOOD LEFT FOREARM  Final   Special  Requests   Final    BOTTLES DRAWN AEROBIC AND ANAEROBIC Blood Culture adequate volume   Culture   Final    NO GROWTH < 12 HOURS Performed at [facility], [address], [place], [place] [address]    Report Status PENDING  Incomplete  Resp panel by RT-PCR (RSV, Flu A&B, Covid)     Status: None   Collection Time: 03/10/24 12:59 AM   Specimen: Nasal Swab  Result Value Ref Range Status   SARS Coronavirus 2 by RT PCR NEGATIVE NEGATIVE Final   Influenza A by PCR NEGATIVE NEGATIVE Final   Influenza B by PCR NEGATIVE NEGATIVE Final    Comment: (NOTE) The Xpert Xpress SARS-CoV-2/FLU/RSV plus assay is intended as an aid in the diagnosis of influenza from Nasopharyngeal swab specimens and should not be used as a sole basis for treatment. Nasal washings and aspirates are unacceptable for Xpert Xpress SARS-CoV-2/FLU/RSV testing.  Fact Sheet  for Patients: bloggercourse.com  Fact Sheet for Healthcare Providers: seriousbroker.it  This test is not yet approved or cleared by the [place]  FDA and has been authorized for detection and/or diagnosis of SARS-CoV-2 by FDA under an Emergency Use Authorization (EUA). This EUA will remain in effect (meaning this test can be used) for the duration of the COVID-19 declaration under Section 564(b)(1) of the Act, 21 U.S.C. section 360bbb-3(b)(1), unless the authorization is terminated or revoked.     Resp Syncytial Virus by PCR NEGATIVE NEGATIVE Final    Comment: (NOTE) Fact Sheet for Patients: bloggercourse.com  Fact Sheet for Healthcare Providers: seriousbroker.it  This test is not yet approved or cleared by the [place]  FDA and has been authorized for detection and/or diagnosis of SARS-CoV-2 by FDA under an Emergency Use Authorization (EUA). This EUA will remain in effect (meaning this test can be used) for the duration of  the COVID-19 declaration under Section 564(b)(1) of the Act, 21 U.S.C. section 360bbb-3(b)(1), unless the authorization is terminated or revoked.  Performed at [facility], [address], [place], [place] [address]   Blood culture (routine x 2)     Status: None (Preliminary result)   Collection Time: 03/10/24  1:04 AM   Specimen: BLOOD  Result Value Ref Range Status   Specimen Description BLOOD LEFT ANTECUBITAL  Final   Special Requests   Final    BOTTLES DRAWN AEROBIC AND ANAEROBIC Blood Culture adequate volume   Culture   Final    NO GROWTH < 12 HOURS Performed at [facility], [address] [place], [place] [address]    Report Status PENDING  Incomplete         Radiology Studies: CT Head Wo Contrast Result Date: 03/10/2024 EXAM: CT HEAD WITHOUT CONTRAST 03/10/2024 01:20:16 AM TECHNIQUE: CT of the head was performed without the administration of intravenous contrast. Automated exposure control, iterative reconstruction, and/or weight based adjustment of the mA/kV was utilized to reduce the radiation dose to as low as reasonably achievable. COMPARISON: 03/08/2024 CLINICAL HISTORY: Delirium; recent SDH, AMS today w/fever. FINDINGS: BRAIN AND VENTRICLES: No acute hemorrhage. Small residual left subdural hematoma measuring 4 mm (image 23), previously 5 mm. Left basal ganglia lacunar infarct. Intracranial atherosclerosis. Subcortical and periventricular small vessel ischemic changes. No hydrocephalus. No mass effect or midline shift. ORBITS: No acute abnormality. SINUSES: No acute abnormality. SOFT TISSUES AND SKULL: No acute soft tissue abnormality. No skull fracture. IMPRESSION: 1. Small residual left subdural hematoma, mildly improved. Electronically signed by: Pinkie Pebbles MD MD 03/10/2024 01:23 AM EST RP Workstation: [id number]   DG Chest Port 1 View Result Date: 03/10/2024 EXAM: 1 VIEW(S) XRAY OF THE CHEST 03/10/2024 01:11:45 AM COMPARISON: None available.  CLINICAL HISTORY: fever, AMS FINDINGS: LINES, TUBES AND DEVICES: Left subclavian pacemaker. LUNGS AND PLEURA: Low lung volumes with mild basilar opacities, likely atelectasis. No pleural effusion. No pneumothorax. HEART AND MEDIASTINUM: Left subclavian pacemaker. No acute abnormality of the cardiac and mediastinal silhouettes. BONES AND SOFT TISSUES: No acute osseous abnormality. IMPRESSION: 1. Low lung volumes with mild basilar opacities, likely atelectasis. Electronically signed by: Pinkie Pebbles MD MD 03/10/2024 01:16 AM EST RP Workstation: [id number]        Scheduled Meds:  donepezil   10 mg Oral QHS   doxycycline   100 mg Oral BID   nitrofurantoin  (macrocrystal-monohydrate)  100 mg Oral QPM   rosuvastatin   5 mg Oral QODAY   saccharomyces boulardii  250 mg Oral BID   Continuous Infusions:  LOS: 0 days   Time spent:  Elsie JAYSON Montclair, DO [facility]  If 7PM-7AM, please contact night-coverage [email or web address]  03/11/2024, 7:33 AM      "

## 2024-03-12 ENCOUNTER — Inpatient Hospital Stay (HOSPITAL_COMMUNITY)

## 2024-03-12 ENCOUNTER — Ambulatory Visit: Admitting: Nurse Practitioner

## 2024-03-12 ENCOUNTER — Other Ambulatory Visit (HOSPITAL_COMMUNITY): Payer: Self-pay

## 2024-03-12 DIAGNOSIS — I10 Essential (primary) hypertension: Secondary | ICD-10-CM | POA: Diagnosis not present

## 2024-03-12 DIAGNOSIS — L89159 Pressure ulcer of sacral region, unspecified stage: Secondary | ICD-10-CM | POA: Diagnosis not present

## 2024-03-12 DIAGNOSIS — I5032 Chronic diastolic (congestive) heart failure: Secondary | ICD-10-CM | POA: Diagnosis not present

## 2024-03-12 DIAGNOSIS — F0280 Dementia in other diseases classified elsewhere without behavioral disturbance: Secondary | ICD-10-CM | POA: Diagnosis not present

## 2024-03-12 DIAGNOSIS — I25119 Atherosclerotic heart disease of native coronary artery with unspecified angina pectoris: Secondary | ICD-10-CM | POA: Diagnosis not present

## 2024-03-12 DIAGNOSIS — F03918 Unspecified dementia, unspecified severity, with other behavioral disturbance: Secondary | ICD-10-CM | POA: Diagnosis not present

## 2024-03-12 DIAGNOSIS — G4733 Obstructive sleep apnea (adult) (pediatric): Secondary | ICD-10-CM | POA: Diagnosis not present

## 2024-03-12 DIAGNOSIS — I2510 Atherosclerotic heart disease of native coronary artery without angina pectoris: Secondary | ICD-10-CM | POA: Diagnosis not present

## 2024-03-12 DIAGNOSIS — S065X0D Traumatic subdural hemorrhage without loss of consciousness, subsequent encounter: Secondary | ICD-10-CM | POA: Diagnosis not present

## 2024-03-12 DIAGNOSIS — E785 Hyperlipidemia, unspecified: Secondary | ICD-10-CM | POA: Diagnosis not present

## 2024-03-12 MED ORDER — DICLOFENAC SODIUM 1 % EX GEL
4.0000 g | Freq: Four times a day (QID) | CUTANEOUS | Status: DC | PRN
  Filled 2024-03-12: qty 100

## 2024-03-12 MED ORDER — MIRABEGRON ER 25 MG PO TB24
50.0000 mg | ORAL_TABLET | Freq: Every morning | ORAL | Status: DC
  Administered 2024-03-12 – 2024-03-14 (×3): 50 mg via ORAL
  Filled 2024-03-12 (×3): qty 2

## 2024-03-12 MED ORDER — LIDOCAINE 5 % EX PTCH
1.0000 | MEDICATED_PATCH | Freq: Every day | CUTANEOUS | Status: DC | PRN
  Administered 2024-03-12 – 2024-03-13 (×2): 1 via TRANSDERMAL
  Filled 2024-03-12 (×2): qty 1

## 2024-03-12 MED ORDER — APIXABAN 5 MG PO TABS
5.0000 mg | ORAL_TABLET | Freq: Two times a day (BID) | ORAL | Status: DC
  Administered 2024-03-14: 5 mg via ORAL
  Filled 2024-03-12: qty 1

## 2024-03-12 MED ORDER — FUROSEMIDE 20 MG PO TABS
20.0000 mg | ORAL_TABLET | Freq: Every day | ORAL | Status: DC
  Administered 2024-03-12 – 2024-03-14 (×3): 20 mg via ORAL
  Filled 2024-03-12 (×3): qty 1

## 2024-03-12 NOTE — Progress Notes (Addendum)
 " PROGRESS NOTE    DONDRE CATALFAMO  FMW:[id number] DOB: [birth year] DOA: 03/10/2024 PCP: Wallie Drafts, NP   Brief Narrative:  [name] [name] is a 84 y.o. 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  He had a repeated CT scan the following day.  Neurosurgery recommended to hold off anticoagulation and antiplatelets for 1 week.  The patient was subsequently discharged home.  Per family members (son/daughter) he has been more somnolent and weak requiring 2 people assist for walking since his fall -patient previously able to walk upwards of 1 mile per day independently.  He does require some minimal assistance at home with ADLs but is mostly independent per discussion with daughter [name].  Patient admitted as above with worsening somnolence and ambulatory dysfunction after recent TBI.  Repeat imaging shows stable subdural hematoma, continues to have profound weakness from baseline, PT OT following, currently awaiting safe disposition to skilled nursing facility.  He is at this time medically stable for discharge.  Assessment & Plan:   Principal Problem:   Subdural hematoma, post-traumatic (HCC) Active Problems:   Closed TBI (traumatic brain injury) (HCC)   Hyperlipidemia   OSA on CPAP   Coronary atherosclerosis   Benign essential hypertension   Chronic heart failure with preserved ejection fraction (HFpEF, >= 50%) (HCC)   CAD (nonobstructive CAD seen on LHC 2010 )   Alzheimer's dementia (HCC)   Dementia with behavioral disturbance (HCC)   AMS (altered mental status)   Pressure injury of skin  Somnolence, weakness Traumatic brain injury,  POA Subdural hematoma, improving, POA - Likely secondary to recent TBI, patient has had profoundly increased somnolence and requiring additional assistance from prior baseline after prior TBI and fall with noted subdural hematoma. - PT OT following, lengthy discussion with family, daughter [name] BROTHERS, and we discussed his likely increased needs in the interim post trauma.  Pending PT and OT recommendations patient may benefit from a short stay at skilled nursing facility versus increased therapy at home. -Exacerbated by chronic Alzheimer's dementia without behavioral disturbances -continue home meds - Patient remains a high fall risk given history with new weakness - OK to restart eliquis  1/15 (one week) after initial subdural per prior documentation w/ neurosurgery  Left hip pain, acute, POA - Left femur plain film pending - *of note patient fell to his Right side so less likely involved.   Stable chronic comorbid conditions: Chronic essential hypertension Hyperlipidemia OSA on CPAP CAD, nonobstructive Pressure injury, POA: Wound 03/10/24 1729 Pressure Injury Sacrum Medial Stage 1 -  Intact skin with non-blanchable redness of a localized area usually over a bony prominence. (Active)   - Continue home medications as below - Daughter indicates patient has profoundly sensitive blood pressure, noted to have profound hypotension with even small dose antihypertensives, as such we will avoid any as needed medications at this time unless profoundly elevated   DVT prophylaxis: SCDs Start: 03/10/24 0332 hold anticoagulation secondary to previous subdural hematoma per neurosurgery recommendations Code Status:   Code Status: Full Code Family Communication: Daughter [name] updated over the phone  Status is:  observation  Dispo: The patient is from: Home  Anticipated d/c is to: SNF              Anticipated d/c date is: Imminent              Patient currently IS medically stable for  discharge  Consultants:  None  Procedures:  None  Antimicrobials:  None indicated  Subjective: No acute issues or events overnight, patient feels quite well, requesting discharge which we discussed is pending his improvement with mobility and walking.  Objective: Vitals:   03/11/24 1600 03/11/24 2049 03/11/24 2246 03/12/24 0024  BP: 136/71 (!) 141/72  123/61  Pulse: 83   83  Resp: 18 16 18  (!) 29  Temp: 98.2 F (36.8 C) 98.2 F (36.8 C)  97.7 F (36.5 C)  TempSrc: Oral Oral  Oral  SpO2: 99% 96%  99%  Weight:      Height:        Intake/Output Summary (Last 24 hours) at 03/12/2024 0715 Last data filed at 03/11/2024 1857 Gross per 24 hour  Intake 240 ml  Output 750 ml  Net -510 ml   Filed Weights   03/10/24 0046  Weight: 83 kg    Examination:  General:  Pleasantly resting in bed, No acute distress. HEENT:  Normocephalic atraumatic.  Sclerae nonicteric, noninjected.  Extraocular movements intact bilaterally. Neck:  Without mass or deformity.  Trachea is midline. Lungs:  Clear to auscultate bilaterally without rhonchi, wheeze, or rales. Heart:  Regular rate and rhythm.  Without murmurs, rubs, or gallops. Abdomen:  Soft, nontender, nondistended.  Without guarding or rebound. Extremities: Without cyanosis, clubbing, edema, or obvious deformity.   Skin:  Warm and dry, no erythema.  Data Reviewed: I have personally reviewed following labs and imaging studies  CBC: Recent Labs  Lab 03/07/24 2327 03/10/24 0103  WBC 7.5 7.5  NEUTROABS 5.7 5.4  HGB 12.6* 14.7  HCT 37.5* 43.9  MCV 93.1 95.9  PLT 200 202   Basic Metabolic Panel: Recent Labs  Lab 03/07/24 2327 03/10/24 0103 03/11/24 0259  NA 141 142 140  K 3.9 4.3 3.8  CL 106 105 107  CO2 27 25 25   GLUCOSE 113* 137* 106*  BUN 22 27* 24*  CREATININE 1.17 1.23 0.96  CALCIUM  10.8* 10.8* 9.7   GFR: Estimated Creatinine Clearance: 58 mL/min (by C-G formula based on SCr of 0.96 mg/dL). Liver Function  Tests: Recent Labs  Lab 03/10/24 0103  AST 29  ALT 24  ALKPHOS 80  BILITOT 0.9  PROT 8.8*  ALBUMIN 4.5   BNP (last 3 results) Recent Labs    01/23/24 1247  PROBNP 239.0   Sepsis Labs: Recent Labs  Lab 03/10/24 0115  LATICACIDVEN 1.6    Recent Results (from the past 240 hours)  Blood culture (routine x 2)     Status: None (Preliminary result)   Collection Time: 03/10/24 12:59 AM   Specimen: BLOOD LEFT FOREARM  Result Value Ref Range Status   Specimen Description BLOOD LEFT FOREARM  Final   Special Requests   Final    BOTTLES DRAWN AEROBIC AND ANAEROBIC Blood Culture adequate volume   Culture   Final    NO GROWTH 1 DAY Performed at [facility], [address] [place], [place] [address]    Report Status PENDING  Incomplete  Resp panel by RT-PCR (RSV, Flu A&B, Covid)     Status: None   Collection Time: 03/10/24 12:59 AM   Specimen: Nasal Swab  Result Value Ref Range Status  SARS Coronavirus 2 by RT PCR NEGATIVE NEGATIVE Final   Influenza A by PCR NEGATIVE NEGATIVE Final   Influenza B by PCR NEGATIVE NEGATIVE Final    Comment: (NOTE) The Xpert Xpress SARS-CoV-2/FLU/RSV plus assay is intended as an aid in the diagnosis of influenza from Nasopharyngeal swab specimens and should not be used as a sole basis for treatment. Nasal washings and aspirates are unacceptable for Xpert Xpress SARS-CoV-2/FLU/RSV testing.  Fact Sheet for Patients: bloggercourse.com  Fact Sheet for Healthcare Providers: seriousbroker.it  This test is not yet approved or cleared by the [place]  FDA and has been authorized for detection and/or diagnosis of SARS-CoV-2 by FDA under an Emergency Use Authorization (EUA). This EUA will remain in effect (meaning this test can be used) for the duration of the COVID-19 declaration under Section 564(b)(1) of the Act, 21 U.S.C. section 360bbb-3(b)(1), unless the authorization is terminated  or revoked.     Resp Syncytial Virus by PCR NEGATIVE NEGATIVE Final    Comment: (NOTE) Fact Sheet for Patients: bloggercourse.com  Fact Sheet for Healthcare Providers: seriousbroker.it  This test is not yet approved or cleared by the [place]  FDA and has been authorized for detection and/or diagnosis of SARS-CoV-2 by FDA under an Emergency Use Authorization (EUA). This EUA will remain in effect (meaning this test can be used) for the duration of the COVID-19 declaration under Section 564(b)(1) of the Act, 21 U.S.C. section 360bbb-3(b)(1), unless the authorization is terminated or revoked.  Performed at [facility], [address] [place], [place] [address]   Blood culture (routine x 2)     Status: None (Preliminary result)   Collection Time: 03/10/24  1:04 AM   Specimen: BLOOD  Result Value Ref Range Status   Specimen Description BLOOD LEFT ANTECUBITAL  Final   Special Requests   Final    BOTTLES DRAWN AEROBIC AND ANAEROBIC Blood Culture adequate volume   Culture   Final    NO GROWTH 1 DAY Performed at [facility], [address] [place], [place] [address]    Report Status PENDING  Incomplete         Radiology Studies: No results found.       Scheduled Meds:  donepezil   10 mg Oral QHS   doxycycline   100 mg Oral BID   nitrofurantoin  (macrocrystal-monohydrate)  100 mg Oral QPM   rosuvastatin   5 mg Oral QODAY   saccharomyces boulardii  250 mg Oral BID   Continuous Infusions:     LOS: 1 day   Time spent:  Elsie JAYSON Montclair, DO [facility]  If 7PM-7AM, please contact night-coverage [email or web address]  03/12/2024, 7:15 AM      "

## 2024-03-12 NOTE — Progress Notes (Signed)
 Physical Therapy Treatment Patient Details Name: [name] [name] MRN: [id number] DOB: 10-20-1940 Today's Date: 03/12/2024   History of Present Illness 84 y.o. male presents to [facility] 03/10/24 with AMS and progressive weakness. Workup unrevealing. Prior admit and d/c home 1/8 after a fall with small SDH. CT scan 1/11 showed improvement of SDH. PMHx: Alzheimer's dementia, OSA on CPAP, CKD 3A, symptomatic bradycardia status post pacemaker placement, chronic HFpEF, LBBB, CAD, paroxysmal A-fib on Eliquis , chronic sialadenitis    PT Comments  Pt resting in bed on arrival, pleasant and agreeable to session and demonstrating good progress towards acute goals. With increased cues for sequence and initiation pt able to come to sitting EOB with light min A to manage trunk. Pt standing and take steps over to the recliner chair with min A to maintain balance and RW for support. Pt able to maintain standing ~10 mins at chair and perform standing LE exercises for carryover for LE clearance for gait and strength maintenance. Pt continues to benefit from skilled PT services to progress toward functional mobility goals.     If plan is discharge home, recommend the following: A lot of help with walking and/or transfers;A lot of help with bathing/dressing/bathroom;Assist for transportation;Help with stairs or ramp for entrance;Assistance with cooking/housework   Can travel by private vehicle     No  Equipment Recommendations  Wheelchair (measurements PT);Wheelchair cushion (measurements PT);BSC/3in1    Recommendations for Other Services       Precautions / Restrictions Precautions Precautions: Fall Recall of Precautions/Restrictions: Impaired Restrictions Weight Bearing Restrictions Per Provider Order: No     Mobility  Bed Mobility Overal bed mobility: Needs Assistance Bed Mobility: Supine to Sit     Supine to sit: Min assist     General bed mobility comments: cues for sequcning to EOB and light min A  to elevate trunk    Transfers Overall transfer level: Needs assistance Equipment used: Rolling walker (2 wheels) Transfers: Sit to/from Stand, Bed to chair/wheelchair/BSC Sit to Stand: Min assist   Step pivot transfers: Min assist       General transfer comment: min A to boost to stand and steady on rise. pt taking steps around to chair with fair clearance.    Ambulation/Gait                   Stairs             Wheelchair Mobility     Tilt Bed    Modified Rankin (Stroke Patients Only)       Balance Overall balance assessment: Needs assistance, Mild deficits observed, not formally tested, History of Falls Sitting-balance support: Feet supported, No upper extremity supported Sitting balance-Leahy Scale: Good     Standing balance support: Bilateral upper extremity supported, During functional activity, Reliant on assistive device for balance Standing balance-Leahy Scale: Poor Standing balance comment: reliant on UE and external support                            Communication Communication Communication: Impaired Factors Affecting Communication: Hearing impaired  Cognition Arousal: Alert Behavior During Therapy: WFL for tasks assessed/performed   PT - Cognitive impairments: History of cognitive impairments, Initiation, Sequencing, Problem solving, Safety/Judgement                         Following commands: Impaired      Cueing Cueing Techniques: Verbal cues, Tactile cues  Exercises  Other Exercises Other Exercises: standing single LE marching x15 each side Other Exercises: hip abduction x10 on LLE    General Comments General comments (skin integrity, edema, etc.): VSS on RA, assisted pt to place lunch order      Pertinent Vitals/Pain Pain Assessment Pain Assessment: No/denies pain    Home Living                          Prior Function            PT Goals (current goals can now be found in the care  plan section) Acute Rehab PT Goals Patient Stated Goal: to go home PT Goal Formulation: With patient Time For Goal Achievement: 03/24/24 Progress towards PT goals: Progressing toward goals    Frequency    Min 2X/week      PT Plan      Co-evaluation              AM-PAC PT 6 Clicks Mobility   Outcome Measure  Help needed turning from your back to your side while in a flat bed without using bedrails?: A Lot Help needed moving from lying on your back to sitting on the side of a flat bed without using bedrails?: A Lot Help needed moving to and from a bed to a chair (including a wheelchair)?: A Lot Help needed standing up from a chair using your arms (e.g., wheelchair or bedside chair)?: A Little Help needed to walk in hospital room?: Total Help needed climbing 3-5 steps with a railing? : Total 6 Click Score: 11    End of Session Equipment Utilized During Treatment: Gait belt Activity Tolerance: Patient tolerated treatment well Patient left: with call bell/phone within reach;in chair;with chair alarm set Nurse Communication: Mobility status PT Visit Diagnosis: Unsteadiness on feet (R26.81);Other abnormalities of gait and mobility (R26.89);Muscle weakness (generalized) (M62.81);Pain     Time: 8943-8878 PT Time Calculation (min) (ACUTE ONLY): 25 min  Charges:    $Therapeutic Exercise: 8-22 mins $Therapeutic Activity: 8-22 mins PT General Charges $$ ACUTE PT VISIT: 1 Visit                     Latasha Buczkowski R. PTA Acute Rehabilitation Services Office: [phone]   Therisa CHRISTELLA Boor 03/12/2024, 4:40 PM

## 2024-03-12 NOTE — NC FL2 (Signed)
 " [place]  MEDICAID FL2 LEVEL OF CARE FORM     IDENTIFICATION  Patient Name: [name] [name] Birthdate: 02/13/1941 Sex: male Admission Date (Current Location): 03/10/2024  [facility] and [place] Number:  Producer, Television/film/video and Address:  [facility], [address], [place], [place] [address]      Provider Number: [id number]  Attending Physician Name and Address:  Lue Elsie BROCKS, MD  Relative Name and Phone Number:       Current Level of Care: Hospital Recommended Level of Care: Skilled Nursing Facility Prior Approval Number:    Date Approved/Denied:   PASRR Number: [id number]  Discharge Plan: SNF    Current Diagnoses: Patient Active Problem List   Diagnosis Date Noted   AMS (altered mental status) 03/10/2024   Pressure injury of skin 03/10/2024   Closed TBI (traumatic brain injury) (HCC) 03/10/2024   Subdural hematoma, post-traumatic (HCC) 03/10/2024   Bradycardia 01/23/2024   Asymptomatic bacteriuria 04/06/2021   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   Lactic acidosis 04/02/2021   Paroxysmal atrial fibrillation (HCC) 04/02/2021   SIRS (systemic inflammatory response syndrome) (HCC) 04/02/2021   Acute respiratory distress 04/02/2021   Dementia with behavioral disturbance (HCC) 04/02/2021   Sepsis (HCC) 01/08/2021   Neck infection 01/08/2021   Encephalopathy due to COVID-19 virus 01/08/2021   Transaminitis 01/08/2021   Sepsis secondary to UTI (HCC) 09/11/2020   Dysuria 08/10/2020   Acute parotitis 07/27/2020   Facial cellulitis 07/26/2020   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   Primary osteoarthritis of left hip 09/06/2018   Alzheimer's dementia (HCC) 12/07/2016   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   Venous stasis dermatitis of left lower extremity 04/07/2016   Bilateral impacted cerumen 01/18/2016   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   Unsteadiness  01/18/2016   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   Cellulitis of left lower leg    Left leg swelling    Cellulitis 08/23/2014   Chronic heart failure with preserved ejection fraction (HFpEF, >= 50%) (HCC) 04/29/2014   DOE (dyspnea on exertion) 11/12/2011   Stage 3a chronic kidney disease (HCC)    Thrombocytopenia, unspecified 07/29/2010   Vitamin D deficiency 05/19/2010   Depression, major, recurrent, 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   Urinary urgency 05/19/2010   Vertigo, 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   Benign essential hypertension 12/23/2009   Actinic keratosis 12/23/2009   Seborrheic keratosis 03/18/2009   Benign neoplasm 03/18/2009   Hyperlipidemia 01/18/2008   OSA on CPAP 01/18/2008   Coronary atherosclerosis 01/18/2008   Atherosclerotic heart disease of native coronary artery without angina pectoris 01/18/2008   Cataract in degenerative disorder 11/21/2006   Unspecified protein-calorie malnutrition 09/27/2006   Gastro-esophageal reflux disease with esophagitis 09/15/2006   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) 05/11/2005   CAD (nonobstructive CAD seen on LHC 2010 ) 05/11/2005    Orientation RESPIRATION BLADDER Height & Weight     Self, Time, Situation, Place  Normal Incontinent Weight: 182 lb 15.7 oz (83 kg) Height:  5' 7 (170.2 cm)  BEHAVIORAL SYMPTOMS/MOOD NEUROLOGICAL BOWEL NUTRITION STATUS      Incontinent Diet (please see discharge summary)  AMBULATORY STATUS COMMUNICATION OF NEEDS Skin   Extensive Assist Verbally Surgical wounds, Skin abrasions (wound tramatic RT arm posterior, wound tramatic RT pretibial , presure injury sacrum medial stage I, at risk for pressure injury)  Personal Care Assistance Level of Assistance  Bathing, Feeding, Dressing Bathing Assistance: Maximum assistance Feeding assistance: Independent Dressing Assistance: Maximum assistance     Functional Limitations Info  Sight,  Hearing, Speech Sight Info: Adequate (wears glasses) Hearing Info: Impaired Speech Info: Adequate    SPECIAL CARE FACTORS FREQUENCY  PT (By licensed PT), OT (By licensed OT)     PT Frequency: 5x per week OT Frequency: 5x per week            Contractures Contractures Info: Not present    Additional Factors Info  Code Status, Allergies Code Status Info: FULL Allergies Info: Darifenacin High Intolerance Other (See Comments), Anaphylaxis PT STATES IT MAKES HIM BONKERS AND VERY TIRED Other Reaction(s): Confusion, Confusion/Altered Mental Status PT STATES IT MAKES HIM BONKERS AND VERY TIRED, Other reaction(s): Mental Status Changes (intolerance), PT STATES IT MAKES HIM BONKERS AND VERY TIRED, PT STATES IT MAKES HIM BONKERS AND VERY TIRED  Enablex (darifenacin Hydrobromide Er) High Intolerance  PT STATES IT MAKES HIM BONKERS AND VERY TIRED  Atorvastatin Medium Intolerance Other (See Comments) , Muscle aches/weakness      Ceftriaxone Medium Unspecified Other (See Comments) Unknown reaction Other reaction(s): Other (See Comments)      Ezetimibe Medium Intolerance Other (See Comments) Muscle aches/weakness Other reaction(s):     Metoprolol  Medium Allergy Rash, Dermatitis   Oxybutynin Medium Intolerance Other (See Comments)   Sulfamethoxazole Medium Allergy Other (See Comments) Childhood allergy Other reaction(s): Other (See Comments), Childhood allergy, Childhood allergy  Sulfasalazine Medium Allergy Other (See Comments) Childhood allergy Other reaction(s): Other (See Comments), Unknown per daughter, Unknown per daughter  Levofloxacin Low Intolerance Diarrhea, Other (See Comments) Other Reaction(s): Mental Status Changes Caused diarheer a  Sulfonamide Derivatives Low Allergy Other (See Comments) Childhood allergy           Current Medications (03/12/2024):  This is the current hospital active medication list Current Facility-Administered Medications  Medication Dose Route Frequency  Provider Last Rate Last Admin   acetaminophen  (TYLENOL ) tablet 500 mg  500 mg Oral Q6H PRN Shona Terry SAILOR, DO       [START ON 03/14/2024] apixaban  (ELIQUIS ) tablet 5 mg  5 mg Oral BID Lue Elsie BROCKS, MD       donepezil  (ARICEPT ) tablet 10 mg  10 mg Oral QHS Shona Terry N, DO   10 mg at 03/11/24 2124   doxycycline  (VIBRA -TABS) tablet 100 mg  100 mg Oral BID Hall, Carole N, DO   100 mg at 03/12/24 9068   furosemide  (LASIX ) tablet 20 mg  20 mg Oral Daily Lue Elsie BROCKS, MD       melatonin tablet 5 mg  5 mg Oral QHS PRN Shona Terry SAILOR, DO       mirabegron  ER (MYRBETRIQ ) tablet 50 mg  50 mg Oral q morning Lue Elsie BROCKS, MD       nitrofurantoin  (macrocrystal-monohydrate) (MACROBID ) capsule 100 mg  100 mg Oral QPM Shona Terry N, DO   100 mg at 03/11/24 1818   polyethylene glycol (MIRALAX  / GLYCOLAX ) packet 17 g  17 g Oral Daily PRN Shona Terry N, DO       prochlorperazine  (COMPAZINE ) injection 5 mg  5 mg Intravenous Q6H PRN Shona Terry N, DO       rosuvastatin  (CRESTOR ) tablet 5 mg  5 mg Oral QODAY Hall, Carole N, DO   5 mg at 03/12/24 9068   saccharomyces boulardii (FLORASTOR) capsule 250 mg  250 mg Oral BID Shona Terry SAILOR, DO  250 mg at 03/12/24 9068     Discharge Medications: Please see discharge summary for a list of discharge medications.  Relevant Imaging Results:  Relevant Lab Results:   Additional Information SSN [id number]  Montie LOISE Louder, LCSW     "

## 2024-03-13 DIAGNOSIS — S065X0D Traumatic subdural hemorrhage without loss of consciousness, subsequent encounter: Secondary | ICD-10-CM | POA: Diagnosis not present

## 2024-03-13 NOTE — Progress Notes (Signed)
 Occupational Therapy Treatment Patient Details Name: [name] [name] MRN: [id number] DOB: 03/27/1940 Today's Date: 03/13/2024   History of present illness 84 y.o. male presents to [facility] 03/10/24 with AMS and progressive weakness. Workup unrevealing. Prior admit and d/c home 1/8 after a fall with small SDH. CT scan 1/11 showed improvement of SDH. PMHx: Alzheimer's dementia, OSA on CPAP, CKD 3A, symptomatic bradycardia status post pacemaker placement, chronic HFpEF, LBBB, CAD, paroxysmal A-fib on Eliquis , chronic sialadenitis   OT comments  Pt progressing well towards goals. Pt limited by pain in LLE. Progressed to complete step pivot transfer with min assist to boost. Pt fixated on pain, declining any further activity. Continues to be limited by decreased strength, balance, and pain. Continue to recommend <3 hours of skilled rehab daily to optimize independence levels. Will continue to follow acutely.       If plan is discharge home, recommend the following:  A lot of help with bathing/dressing/bathroom;Assist for transportation;Help with stairs or ramp for entrance;A lot of help with walking and/or transfers   Equipment Recommendations  Other (comment) (Defer to next venue)       Precautions / Restrictions Precautions Precautions: Fall Recall of Precautions/Restrictions: Impaired Restrictions Weight Bearing Restrictions Per Provider Order: No       Mobility Bed Mobility Overal bed mobility: Needs Assistance Bed Mobility: Sit to Supine       Sit to supine: Min assist   General bed mobility comments: Assist to manage LLE    Transfers Overall transfer level: Needs assistance Equipment used: Rolling walker (2 wheels) Transfers: Sit to/from Stand, Bed to chair/wheelchair/BSC Sit to Stand: Min assist     Step pivot transfers: Contact guard assist     General transfer comment: Min assist to stand, CGA for step pivot back to bed     Balance Overall balance assessment: Needs  assistance, Mild deficits observed, not formally tested, History of Falls Sitting-balance support: Feet supported, No upper extremity supported Sitting balance-Leahy Scale: Good     Standing balance support: Bilateral upper extremity supported, During functional activity, Reliant on assistive device for balance Standing balance-Leahy Scale: Poor Standing balance comment: reliant on UE and external support       ADL either performed or assessed with clinical judgement   ADL Overall ADL's : Needs assistance/impaired       Toilet Transfer: Minimal assistance;Rolling walker (2 wheels);Ambulation   Toileting- Clothing Manipulation and Hygiene: Total assistance Toileting - Clothing Manipulation Details (indicate cue type and reason): Incontinent of urine in standing     Functional mobility during ADLs: Minimal assistance;Rolling walker (2 wheels) General ADL Comments: Limited by decreased strength and balance    Extremity/Trunk Assessment Upper Extremity Assessment Upper Extremity Assessment: Generalized weakness   Lower Extremity Assessment Lower Extremity Assessment: Defer to PT evaluation        Vision   Vision Assessment?: No apparent visual deficits         Communication Communication Communication: Impaired Factors Affecting Communication: Hearing impaired   Cognition Arousal: Alert Behavior During Therapy: WFL for tasks assessed/performed Cognition: History of cognitive impairments, Cognition impaired     Awareness: Intellectual awareness impaired Memory impairment (select all impairments): Working civil service fast streamer, Programmer, systems, Conservation officer, historic buildings Attention impairment (select first level of impairment): Sustained attention Executive functioning impairment (select all impairments): Sequencing, Organization, Problem solving, Reasoning OT - Cognition Comments: Poor insight into deficits, requires increased time to follow simple commands, perseverates at times  d/t pain     Following commands: Impaired Following  commands impaired: Follows multi-step commands inconsistently, Follows one step commands with increased time      Cueing   Cueing Techniques: Verbal cues, Tactile cues        General Comments VSS on RA    Pertinent Vitals/ Pain       Pain Assessment Pain Assessment: Faces Faces Pain Scale: Hurts even more Pain Location: LLE Pain Descriptors / Indicators: Discomfort, Grimacing, Guarding, Crushing Pain Intervention(s): Limited activity within patient's tolerance   Frequency  Min 2X/week        Progress Toward Goals  OT Goals(current goals can now be found in the care plan section)  Progress towards OT goals: Progressing toward goals  Acute Rehab OT Goals Patient Stated Goal: to get better OT Goal Formulation: With patient Time For Goal Achievement: 03/24/24 Potential to Achieve Goals: Good ADL Goals Pt Will Perform Grooming: with contact guard assist;standing Pt Will Perform Lower Body Dressing: with contact guard assist;sit to/from stand;with adaptive equipment Pt Will Transfer to Toilet: with contact guard assist;ambulating;bedside commode Pt Will Perform Toileting - Clothing Manipulation and hygiene: with contact guard assist;sit to/from stand  Plan         AM-PAC OT 6 Clicks Daily Activity     Outcome Measure   Help from another person eating meals?: None Help from another person taking care of personal grooming?: A Little Help from another person toileting, which includes using toliet, bedpan, or urinal?: Total Help from another person bathing (including washing, rinsing, drying)?: A Lot Help from another person to put on and taking off regular upper body clothing?: A Little Help from another person to put on and taking off regular lower body clothing?: A Lot 6 Click Score: 15    End of Session Equipment Utilized During Treatment: Gait belt;Rolling walker (2 wheels)  OT Visit Diagnosis: Unsteadiness  on feet (R26.81);Muscle weakness (generalized) (M62.81);Other symptoms and signs involving cognitive function;History of falling (Z91.81);Pain Pain - Right/Left: Left   Activity Tolerance Patient limited by pain   Patient Left in bed;with call bell/phone within reach;with bed alarm set   Nurse Communication Mobility status        Time: 1130-1149 OT Time Calculation (min): 19 min  Charges: OT General Charges $OT Visit: 1 Visit OT Treatments $Self Care/Home Management : 8-22 mins  Adrianne BROCKS, OT  Acute Rehabilitation Services Office [phone] Secure chat preferred   Adrianne GORMAN Savers 03/13/2024, 12:43 PM

## 2024-03-13 NOTE — Progress Notes (Signed)
 [facility] PROGRESS NOTE    Progress Note  [name] [name]  FMW:[id number] DOB: [birth year] DOA: 03/10/2024 PCP: Wallie Drafts, NP     Brief Narrative:   [name] [name] is an 84 y.o. 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  Who came in to the ER on 03/07/2024 for fall leading to a small subdural hematoma.  Neurosurgery was consulted recommended to hold anticoagulation antiplatelet for at least a week.  The patient was discharged home, the patient's family member related he was more somnolent requiring 2 people assist for walking.  The family related he was independent.  Patient admitted for somnolence ambulatory dysfunction after traumatic brain injury repeated CT shows stable subdural hematoma continues to have profound weakness at baseline.  PT evaluated the patient and recommended skilled nursing facility.  He is medically stable for discharge at this point in time.  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  The patient will Alzheimer's dementia, PT evaluated the patient and will probably need skilled nursing facility placement. Acute to restart Eliquis  on 03/16/2023.  Left hip pain: Imaging showed no acute findings.  Stable chronic medical problems: Essential hypertension, daughter relates patient is profoundly sensitive to blood pressure and she relates he is been on to develop profound hypotension in with a small doses of antihypertensive medication.  So we will continue to hold. Hyperlipidemia Obstructive sleep apnea on CPAP CAD nonobstructive holding therapy and anticoagulation till  03/16/2023 Chronic heart failure with preserved ejection fraction (HFpEF, >= 50%) (HCC) Alzheimer's dementia /  Dementia with behavioral disturbance (HCC) Stage I significant results are present on admission: RN Pressure Injury Documentation: Wound 03/10/24 1729 Pressure Injury Sacrum Medial Stage 1 -  Intact skin with non-blanchable redness of a localized area usually over a bony prominence. (Active)     DVT prophylaxis: scd Family Communication:daughter Status is: Inpatient Remains inpatient appropriate because: Subdural hematoma    Code Status:     Code Status Orders  (From admission, onward)           Start     Ordered   03/10/24 0332  Full code  Continuous       Question:  By:  Answer:  Consent: discussion documented in EHR   03/10/24 0332           Code Status History     Date Active Date Inactive Code Status Order ID Comments User Context   01/23/2024 2034 01/24/2024 2154 Full Code [id number]  Tobie Jorie SAUNDERS, MD Inpatient   01/23/2024 1944 01/23/2024 2034 Full Code [id number]  Natalia Waddell DELENA DEVONNA Inpatient   11/17/2022 1633 11/22/2022 2119 Do not attempt resuscitation (DNR) PRE-ARREST INTERVENTIONS DESIRED [id number]  Tina Tobey Jama LOISE, NP Inpatient   04/02/2021 0903 04/06/2021 1931 Full Code [id number]  Claudene Maximino DELENA, MD ED   01/08/2021 0726 01/10/2021 1830 Full Code [id number]  Claudene Maximino DELENA, MD Inpatient   09/11/2020 1352 09/13/2020 2358 Full Code [id number]  Barbarann Nest, MD ED   07/27/2020 0808 07/31/2020 2059 Full Code [id number]  Barbarann Nest, MD Inpatient   09/14/2018 1308 09/17/2018 1624 Full Code [id number]  Vernetta Lonni GRADE, MD Inpatient   08/24/2014 0107 08/25/2014 1434 Full Code  [id number]  Doutova, Anastassia, MD Inpatient      Advance Directive Documentation    Flowsheet Row Most Recent Value  Type of Advance Directive Healthcare Power of Attorney  Pre-existing out of facility DNR order (yellow form or pink MOST form) --  MOST Form in  Place? --      IV Access:   Peripheral IV   Procedures and diagnostic studies:   DG FEMUR PORT MIN 2 VIEWS LEFT Result Date: 03/12/2024 EXAM: 2 VIEW(S) XRAY OF THE LEFT FEMUR 03/12/2024 03:00:21 PM COMPARISON: None available. CLINICAL HISTORY: History of trauma. FINDINGS: BONES AND JOINTS: No acute fracture. No malalignment. 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 Mild degenerative changes of the knee. SOFT TISSUES: Unremarkable. IMPRESSION: 1. No acute findings. Electronically signed by: Waddell Calk MD 03/12/2024 05:01 PM EST RP Workstation: [id number]     Medical Consultants:   None.   Subjective:    CADENCE HASLAM no complaints feels better  Objective:    Vitals:   03/13/24 0033 03/13/24 0416 03/13/24 0807 03/13/24 0809  BP: (!) 143/72 (!) 140/67 (!) 145/69 (!) 146/69  Pulse: 82 84 84 81  Resp: (!) 22 (!) 25 17 13   Temp: 98.4 F (36.9 C) 98.6 F (37 C) (!) 97.3 F (36.3 C) (!) 97.3 F (36.3 C)  TempSrc: Oral Oral Axillary Oral  SpO2: 95% 96% 96% 97%  Weight:      Height:       SpO2: 97 % FiO2 (%): 21 %   Intake/Output Summary (Last 24 hours) at 03/13/2024 0929 Last data filed at 03/12/2024 1957 Gross per 24 hour  Intake 240 ml  Output 650 ml  Net -410 ml   Filed Weights   03/10/24 0046  Weight: 83 kg    Exam: General exam: In no acute distress. Respiratory system: Good air movement and clear to auscultation. Cardiovascular system: S1 & S2 heard, RRR. No JVD. Gastrointestinal system: Abdomen is nondistended, soft and nontender.  Central nervous system: Alert and oriented x 3 Extremities: No pedal edema. Skin: No rashes, lesions or ulcers Psychiatry: Judgement and insight appear intact.   Data Reviewed:    Labs: Basic Metabolic Panel: Recent Labs  Lab 03/07/24 2327 03/10/24 0103 03/11/24 0259  NA 141 142 140  K 3.9 4.3 3.8  CL 106 105 107  CO2 27 25 25   GLUCOSE 113* 137* 106*  BUN 22 27* 24*  CREATININE 1.17 1.23 0.96  CALCIUM   10.8* 10.8* 9.7   GFR Estimated Creatinine Clearance: 58 mL/min (by C-G formula based on SCr of 0.96 mg/dL). Liver Function Tests: Recent Labs  Lab 03/10/24 0103  AST 29  ALT 24  ALKPHOS 80  BILITOT 0.9  PROT 8.8*  ALBUMIN 4.5   No results for input(s): LIPASE, AMYLASE in the last 168 hours. No results for input(s): AMMONIA in the last 168 hours. Coagulation profile No results for input(s): INR, PROTIME in the last 168 hours. COVID-19 Labs  No results for input(s): DDIMER, FERRITIN, LDH, CRP in the last 72 hours.  Lab Results  Component Value Date   SARSCOV2NAA NEGATIVE 03/10/2024   SARSCOV2NAA NEGATIVE 01/23/2024   SARSCOV2NAA NEGATIVE 06/19/2023   SARSCOV2NAA NEGATIVE 12/23/2021    CBC: Recent Labs  Lab 03/07/24 2327 03/10/24 0103  WBC 7.5 7.5  NEUTROABS 5.7 5.4  HGB 12.6* 14.7  HCT 37.5* 43.9  MCV 93.1 95.9  PLT 200 202   Cardiac Enzymes: No results for input(s): CKTOTAL, CKMB, CKMBINDEX, TROPONINI in the last 168 hours.  BNP (last 3 results) Recent Labs    01/23/24 1247  PROBNP 239.0   CBG: No results for input(s): GLUCAP in the last 168 hours. D-Dimer: No results for input(s): DDIMER in the last 72 hours. Hgb A1c: No results for input(s): HGBA1C in the last 72 hours. Lipid Profile: No results for input(s): CHOL, HDL, LDLCALC, TRIG, CHOLHDL, LDLDIRECT in the last 72 hours. Thyroid  function studies: No results for input(s): TSH, T4TOTAL, T3FREE, THYROIDAB in the last 72 hours.  Invalid input(s): FREET3 Anemia work up: No results for input(s): VITAMINB12, FOLATE, FERRITIN, TIBC, IRON, RETICCTPCT in the last 72 hours. Sepsis Labs: Recent Labs  Lab 03/07/24 2327 03/10/24 0103 03/10/24 0115  WBC 7.5 7.5  --   LATICACIDVEN  --   --  1.6   Microbiology Recent Results (from the past 240 hours)  Blood culture (routine x 2)     Status: None (Preliminary result)   Collection Time:  03/10/24 12:59 AM   Specimen: BLOOD LEFT FOREARM  Result Value Ref Range Status   Specimen Description BLOOD LEFT FOREARM  Final   Special Requests   Final    BOTTLES DRAWN AEROBIC AND ANAEROBIC Blood Culture adequate volume   Culture   Final    NO GROWTH 3 DAYS Performed at [facility], [address] [place], [place] [address]    Report Status PENDING  Incomplete  Resp panel by RT-PCR (RSV, Flu A&B, Covid)     Status: None   Collection Time: 03/10/24 12:59 AM   Specimen: Nasal Swab  Result Value Ref Range Status   SARS Coronavirus 2 by RT PCR NEGATIVE NEGATIVE Final   Influenza A by PCR NEGATIVE NEGATIVE Final   Influenza B by PCR NEGATIVE NEGATIVE Final    Comment: (NOTE) The Xpert Xpress SARS-CoV-2/FLU/RSV plus assay is intended as an aid in the diagnosis of influenza from Nasopharyngeal swab specimens and should not be used as a sole basis for treatment. Nasal washings and aspirates are unacceptable for Xpert Xpress SARS-CoV-2/FLU/RSV testing.  Fact Sheet for Patients: bloggercourse.com  Fact Sheet for Healthcare Providers: seriousbroker.it  This test is not yet approved or cleared by the [place]  FDA and has been authorized for detection and/or diagnosis of SARS-CoV-2 by FDA under an Emergency Use Authorization (EUA). This EUA will remain in effect (meaning this test can be used) for the duration of the COVID-19 declaration under Section 564(b)(1) of the Act, 21 U.S.C. section 360bbb-3(b)(1), unless the authorization is terminated or revoked.     Resp Syncytial Virus by PCR NEGATIVE NEGATIVE Final    Comment: (NOTE) Fact Sheet for Patients: bloggercourse.com  Fact Sheet for Healthcare Providers: seriousbroker.it  This test is not yet approved or cleared by the [place]  FDA and has been authorized for detection and/or diagnosis of SARS-CoV-2 by FDA  under an Emergency Use Authorization (EUA). This EUA will remain in effect (meaning this test can be used) for the duration of the COVID-19 declaration under Section 564(b)(1) of the Act, 21 U.S.C. section 360bbb-3(b)(1), unless the authorization is terminated or revoked.  Performed at [facility], [address] [place], [place] [address]   Blood culture (routine x 2)     Status: None (Preliminary result)   Collection Time: 03/10/24  1:04 AM   Specimen: BLOOD  Result Value Ref Range Status   Specimen Description BLOOD LEFT ANTECUBITAL  Final   Special Requests   Final    BOTTLES DRAWN AEROBIC AND ANAEROBIC Blood Culture  adequate volume   Culture   Final    NO GROWTH 3 DAYS Performed at [facility], [address], [place], [place] [address]    Report Status PENDING  Incomplete     Medications:    [START ON 03/14/2024] apixaban   5 mg Oral BID   donepezil   10 mg Oral QHS   doxycycline   100 mg Oral BID   furosemide   20 mg Oral Daily   mirabegron  ER  50 mg Oral q morning   nitrofurantoin  (macrocrystal-monohydrate)  100 mg Oral QPM   rosuvastatin   5 mg Oral QODAY   saccharomyces boulardii  250 mg Oral BID   Continuous Infusions:    LOS: 2 days   Erle Odell Castor  [facility]  03/13/2024, 9:29 AM

## 2024-03-13 NOTE — Progress Notes (Signed)
 Mobility Specialist Progress Note;   03/13/24 1012  Mobility  Activity Pivoted/transferred from bed to chair  Level of Assistance Moderate assist, patient does 50-74%  Assistive Device Front wheel walker  Distance Ambulated (ft) 5 ft  Activity Response Tolerated fair  Mobility Referral Yes  Mobility visit 1 Mobility  Mobility Specialist Start Time (ACUTE ONLY) 1012  Mobility Specialist Stop Time (ACUTE ONLY) 1022  Mobility Specialist Time Calculation (min) (ACUTE ONLY) 10 min   Pt eager for OOB mobility. Required ModA for bed mobility to assist w/ BLE and trunk elevation, MinA to stand from slightly elevated bed and safley transfer over to chair. VC required for safety awareness. VSS throughout. C/o L hip/knee pain throughout mobility, deferring gait at this time. Pt left in chair with all needs met, alarm on.   Lauraine Erm Mobility Specialist Please contact via SecureChat or [employer] [phone]

## 2024-03-13 NOTE — TOC Progression Note (Signed)
 Transition of Care [facility]) - Progression Note    Patient Details  Name: [name] [name] MRN: [id number] Date of Birth: [birth year]  [facility]) CM/SW Contact  Montie LOISE Louder, [place] Phone Number: 03/13/2024, 12:22 PM  Clinical Narrative:     CSW attempt to call patient's daughter,[name], unable to reach per [employer] recording- left bed offers in the room with the patient w/ medicare star ratings.  TOC continues to follow and assist with d/c planning.   Montie Louder, MSW, LCSW Clinical Social Worker      Barriers to Discharge: Continued Medical Work up               Expected Discharge Plan and Services In-house Referral: Clinical Social Work                                             Social Drivers of Health (SDOH) Interventions SDOH Screenings   Food Insecurity: No Food Insecurity (03/10/2024)  Housing: Low Risk (03/10/2024)  Transportation Needs: No Transportation Needs (03/10/2024)  Utilities: Not At Risk (03/10/2024)  Financial Resource Strain: Low Risk (10/11/2023)   Received from [facility]  Physical Activity: Inactive (10/11/2023)   Received from [facility]  Social Connections: Moderately Integrated (03/10/2024)  Stress: No Stress Concern Present (10/11/2023)   Received from [facility]  Tobacco Use: Medium Risk (03/10/2024)    Readmission Risk Interventions    01/24/2024    2:41 PM  Readmission Risk Prevention Plan  Post Dischage Appt Complete  Medication Screening Complete  Transportation Screening Complete

## 2024-03-14 ENCOUNTER — Other Ambulatory Visit (HOSPITAL_COMMUNITY): Payer: Self-pay

## 2024-03-14 ENCOUNTER — Other Ambulatory Visit: Payer: Self-pay

## 2024-03-14 DIAGNOSIS — S065XAA Traumatic subdural hemorrhage with loss of consciousness status unknown, initial encounter: Secondary | ICD-10-CM

## 2024-03-14 DIAGNOSIS — S065X0D Traumatic subdural hemorrhage without loss of consciousness, subsequent encounter: Secondary | ICD-10-CM | POA: Diagnosis not present

## 2024-03-14 MED ORDER — SACCHAROMYCES BOULARDII 250 MG PO CAPS
250.0000 mg | ORAL_CAPSULE | Freq: Two times a day (BID) | ORAL | 2 refills | Status: AC
  Filled 2024-03-14: qty 60, 30d supply, fill #0
  Filled 2024-03-14: qty 20, 10d supply, fill #0

## 2024-03-14 MED ORDER — ONDANSETRON 4 MG PO TBDP
4.0000 mg | ORAL_TABLET | Freq: Three times a day (TID) | ORAL | 0 refills | Status: AC | PRN
  Filled 2024-03-14 (×2): qty 20, 7d supply, fill #0

## 2024-03-14 NOTE — Progress Notes (Signed)
 Physical Therapy Treatment Patient Details Name: [name] [name] MRN: [id number] DOB: 1940/03/03 Today's Date: 03/14/2024   History of Present Illness 84 y.o. male presents to [facility] 03/10/24 with AMS and progressive weakness. Workup unrevealing. Prior admit and d/c home 1/8 after a fall with small SDH. CT scan 1/11 showed improvement of SDH. PMHx: Alzheimer's dementia, OSA on CPAP, CKD 3A, symptomatic bradycardia status post pacemaker placement, chronic HFpEF, LBBB, CAD, paroxysmal A-fib on Eliquis , chronic sialadenitis    PT Comments  Pt up in recliner on arrival, pleasant and agreeable to session and demonstrating great progress towards acute goals. Pt able to progress ambulation for hallway distance >300' with RW for support and chair follow for safety, however pt ultimately not needing a seated rest. Pt continues to require cues for sequencing and safety with transfers sit<>stand with pt benefiting from cues to place LLE anterior before coming to sit to mitigate pain with hip/knee flexion. Pt daughter present and supportive. Continued education with pt and daughter on importance of frequent mobilization to maximize functional mobility gains. Pt continues to benefit from skilled PT services to progress toward functional mobility goals.      If plan is discharge home, recommend the following: A lot of help with walking and/or transfers;A lot of help with bathing/dressing/bathroom;Assist for transportation;Help with stairs or ramp for entrance;Assistance with cooking/housework   Can travel by private vehicle     No  Equipment Recommendations  Wheelchair (measurements PT);Wheelchair cushion (measurements PT)    Recommendations for Other Services       Precautions / Restrictions Precautions Precautions: Fall Recall of Precautions/Restrictions: Impaired Restrictions Weight Bearing Restrictions Per Provider Order: No     Mobility  Bed Mobility Overal bed mobility: Needs Assistance              General bed mobility comments: pt up in chair on arrival    Transfers Overall transfer level: Needs assistance Equipment used: Rolling walker (2 wheels) Transfers: Sit to/from Stand, Bed to chair/wheelchair/BSC Sit to Stand: Contact guard assist           General transfer comment: cues for hand placement on armrests and to utilize momentum to rise from low recliner, cues to place LLE anterior prior to sitting to midigate pain wth flexion    Ambulation/Gait Ambulation/Gait assistance: Contact guard assist, +2 safety/equipment (chair follow but not needed) Gait Distance (Feet): 320 Feet Assistive device: Rolling walker (2 wheels) Gait Pattern/deviations: Step-through pattern, Antalgic, Trunk flexed Gait velocity: decr     General Gait Details: pt ambulating with a slightly antalgic pattern from LLE pain, cues for forward gaxe and upright posture with pt able to correct, daughter providing chair follow but ultimately not needed   Stairs             Wheelchair Mobility     Tilt Bed    Modified Rankin (Stroke Patients Only)       Balance Overall balance assessment: Needs assistance, Mild deficits observed, not formally tested, History of Falls Sitting-balance support: Feet supported, No upper extremity supported Sitting balance-Leahy Scale: Good     Standing balance support: Bilateral upper extremity supported, During functional activity, Reliant on assistive device for balance Standing balance-Leahy Scale: Poor Standing balance comment: reliant on UE support                            Communication Communication Communication: Impaired Factors Affecting Communication: Hearing impaired  Cognition Arousal: Alert Behavior During  Therapy: WFL for tasks assessed/performed   PT - Cognitive impairments: History of cognitive impairments, Initiation, Sequencing, Problem solving, Safety/Judgement                         Following  commands: Impaired Following commands impaired: Follows multi-step commands inconsistently, Follows one step commands with increased time    Cueing Cueing Techniques: Verbal cues, Tactile cues  Exercises      General Comments General comments (skin integrity, edema, etc.): VSS on RA, daughter present and supportive      Pertinent Vitals/Pain Pain Assessment Pain Assessment: Faces Faces Pain Scale: Hurts a little bit Pain Location: LLE Pain Descriptors / Indicators: Discomfort, Grimacing, Guarding, Crushing Pain Intervention(s): Monitored during session, Limited activity within patient's tolerance    Home Living                          Prior Function            PT Goals (current goals can now be found in the care plan section) Acute Rehab PT Goals Patient Stated Goal: to go home PT Goal Formulation: With patient Time For Goal Achievement: 03/24/24 Progress towards PT goals: Progressing toward goals    Frequency    Min 2X/week      PT Plan      Co-evaluation              AM-PAC PT 6 Clicks Mobility   Outcome Measure  Help needed turning from your back to your side while in a flat bed without using bedrails?: A Little Help needed moving from lying on your back to sitting on the side of a flat bed without using bedrails?: A Little Help needed moving to and from a bed to a chair (including a wheelchair)?: A Little Help needed standing up from a chair using your arms (e.g., wheelchair or bedside chair)?: A Little Help needed to walk in hospital room?: A Little Help needed climbing 3-5 steps with a railing? : Total 6 Click Score: 16    End of Session Equipment Utilized During Treatment: Gait belt Activity Tolerance: Patient tolerated treatment well Patient left: with call bell/phone within reach;in chair;with chair alarm set Nurse Communication: Mobility status PT Visit Diagnosis: Unsteadiness on feet (R26.81);Other abnormalities of gait and  mobility (R26.89);Muscle weakness (generalized) (M62.81);Pain Pain - Right/Left: Left Pain - part of body: Hip     Time: 8896-8866 PT Time Calculation (min) (ACUTE ONLY): 30 min  Charges:    $Gait Training: 8-22 mins $Therapeutic Activity: 8-22 mins PT General Charges $$ ACUTE PT VISIT: 1 Visit                     Walter Grima R. PTA Acute Rehabilitation Services Office: [phone]   Therisa CHRISTELLA Boor 03/14/2024, 12:43 PM

## 2024-03-14 NOTE — Progress Notes (Signed)
 Reviewed AVS with daughter, she expressed understanding of medications, MD follow up reviewed.   Nursing staff Removed IV, Site clean, dry and intact.  See LDA for information on wounds at discharge. CCMD contacted and informed patients is being discharged.  Patient states all belongings brought to the hospital at time of admission are accounted for and packed to take home.  Picked up medications from [facility] pharmacy. [facility] Transport contacted to transport patient to entrance A, where family member was waiting in vehicle to transport home.

## 2024-03-14 NOTE — Progress Notes (Signed)
 [facility] PROGRESS NOTE    Progress Note  [name] [name]  FMW:[id number] DOB: 11/28/1940 DOA: 03/10/2024 PCP: Wallie Drafts, NP   Brief Narrative:   [name] [name] is an 84 y.o. 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  Who came in to the ER on 03/07/2024 for fall leading to a small subdural hematoma.  Neurosurgery was consulted recommended to hold anticoagulation antiplatelet for at least a week.  The patient was discharged home, the patient's family member related he was more somnolent requiring 2 people assist for walking.  The family related he was independent.  Patient admitted for somnolence ambulatory dysfunction after traumatic brain injury repeated CT shows stable subdural hematoma continues to have profound weakness at baseline.  PT evaluated the patient and recommended skilled nursing facility.  He is medically stable for discharge at this point in time.  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  The patient will Alzheimer's dementia, PT evaluated the patient and will probably need skilled nursing facility placement. Acute to restart Eliquis  on 03/16/2023.  Left hip pain: Imaging showed no acute findings.  Stable chronic medical problems: Essential hypertension, daughter relates patient is profoundly sensitive to blood pressure and she relates he is been on to develop profound hypotension in with a small doses of antihypertensive medication.  So we will continue to hold. Hyperlipidemia Obstructive sleep apnea on CPAP CAD nonobstructive holding therapy and anticoagulation till  03/16/2023 Chronic heart failure with preserved ejection fraction (HFpEF, >= 50%) (HCC) Alzheimer's dementia /  Dementia with behavioral disturbance (HCC) Stage I significant results are present on admission: RN Pressure Injury Documentation: Wound 03/10/24 1729 Pressure Injury Sacrum Medial Stage 1 -  Intact skin with non-blanchable redness of a localized area usually over a bony prominence. (Active)     DVT prophylaxis: scd Family Communication:daughter Status is: Inpatient Remains inpatient appropriate because: Subdural hematoma    Code Status:     Code Status Orders  (From admission, onward)           Start     Ordered   03/10/24 0332  Full code  Continuous       Question:  By:  Answer:  Consent: discussion documented in EHR   03/10/24 0332           Code Status History     Date Active Date Inactive Code Status Order ID Comments User Context   01/23/2024 2034 01/24/2024 2154 Full Code [id number]  Tobie Jorie SAUNDERS, MD Inpatient   01/23/2024 1944 01/23/2024 2034 Full Code [id number]  Natalia Waddell DELENA DEVONNA Inpatient   11/17/2022 1633 11/22/2022 2119 Do not attempt resuscitation (DNR) PRE-ARREST INTERVENTIONS DESIRED [id number]  Tina Tobey Jama LOISE, NP Inpatient   04/02/2021 0903 04/06/2021 1931 Full Code [id number]  Claudene Maximino DELENA, MD ED   01/08/2021 0726 01/10/2021 1830 Full Code [id number]  Claudene Maximino DELENA, MD Inpatient   09/11/2020 1352 09/13/2020 2358 Full Code [id number]  Barbarann Nest, MD ED   07/27/2020 0808 07/31/2020 2059 Full Code [id number]  Barbarann Nest, MD Inpatient   09/14/2018 1308 09/17/2018 1624 Full Code [id number]  Vernetta Lonni GRADE, MD Inpatient   08/24/2014 0107 08/25/2014 1434 Full Code [id number]  Doutova, Anastassia, MD Inpatient      Advance Directive Documentation    Flowsheet Row Most Recent Value  Type of Advance Directive Healthcare Power of Attorney  Pre-existing out of facility DNR order (yellow form or pink MOST form) --  MOST Form in  Place? --      IV Access:   Peripheral IV   Procedures and diagnostic studies:   DG FEMUR PORT MIN 2 VIEWS LEFT Result Date: 03/12/2024 EXAM: 2 VIEW(S) XRAY OF THE LEFT FEMUR 03/12/2024 03:00:21 PM COMPARISON: None available. CLINICAL HISTORY: History of trauma. FINDINGS: BONES AND JOINTS: No acute fracture. No malalignment. 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 Mild degenerative changes of the knee. SOFT TISSUES: Unremarkable. IMPRESSION: 1. No acute findings. Electronically signed by: Waddell Calk MD 03/12/2024 05:01 PM EST RP Workstation: [id number]     Medical Consultants:   None.   Subjective:    [name] [name] no complaints feels better  Objective:    Vitals:   03/13/24 1620 03/13/24 2214 03/13/24 2322 03/14/24 0411  BP: (!) 140/63 (!) 152/77 (!) 149/76 (!) 140/72  Pulse: 85 78 76 68  Resp: 15 16 20 20   Temp: 97.8 F (36.6 C) 98.5 F (36.9 C) 98 F (36.7 C) 98.1 F (36.7 C)  TempSrc: Oral Oral Oral Oral  SpO2: 97% 98% 100%   Weight:      Height:       SpO2: 100 % FiO2 (%): 21 %   Intake/Output Summary (Last 24 hours) at 03/14/2024 0754 Last data filed at 03/14/2024 0419 Gross per 24 hour  Intake --  Output 900 ml  Net -900 ml   Filed Weights   03/10/24 0046  Weight: 83 kg    Exam: General exam: In no acute distress. Respiratory system: Good air movement and clear to auscultation. Cardiovascular system: S1 & S2 heard, RRR. No JVD. Gastrointestinal system: Abdomen is nondistended, soft and nontender.  Central nervous system: Alert and oriented x 3 Extremities: No pedal edema. Skin: No rashes, lesions or ulcers Psychiatry: Judgement and insight appear intact.   Data Reviewed:    Labs: Basic Metabolic Panel: Recent Labs  Lab 03/07/24 2327 03/10/24 0103 03/11/24 0259  NA 141 142 140  K 3.9 4.3 3.8  CL 106 105 107  CO2 27 25 25   GLUCOSE 113* 137* 106*  BUN 22 27* 24*  CREATININE 1.17 1.23 0.96  CALCIUM  10.8* 10.8* 9.7    GFR Estimated Creatinine Clearance: 58 mL/min (by C-G formula based on SCr of 0.96 mg/dL). Liver Function Tests: Recent Labs  Lab 03/10/24 0103  AST 29  ALT 24  ALKPHOS 80  BILITOT 0.9  PROT 8.8*  ALBUMIN 4.5   No results for input(s): LIPASE, AMYLASE in the last 168 hours. No results for input(s): AMMONIA in the last 168 hours. Coagulation profile No results for input(s): INR, PROTIME in the last 168 hours. COVID-19 Labs  No results for input(s): DDIMER, FERRITIN, LDH, CRP in the last 72 hours.  Lab Results  Component Value Date   SARSCOV2NAA NEGATIVE 03/10/2024   SARSCOV2NAA NEGATIVE 01/23/2024   SARSCOV2NAA NEGATIVE 06/19/2023   SARSCOV2NAA NEGATIVE 12/23/2021    CBC: Recent Labs  Lab 03/07/24 2327 03/10/24 0103  WBC 7.5 7.5  NEUTROABS 5.7 5.4  HGB 12.6* 14.7  HCT 37.5* 43.9  MCV 93.1 95.9  PLT 200 202   Cardiac Enzymes: No results for input(s): CKTOTAL, CKMB, CKMBINDEX, TROPONINI in the last 168 hours. BNP (last 3 results) Recent Labs  01/23/24 1247  PROBNP 239.0   CBG: No results for input(s): GLUCAP in the last 168 hours. D-Dimer: No results for input(s): DDIMER in the last 72 hours. Hgb A1c: No results for input(s): HGBA1C in the last 72 hours. Lipid Profile: No results for input(s): CHOL, HDL, LDLCALC, TRIG, CHOLHDL, LDLDIRECT in the last 72 hours. Thyroid  function studies: No results for input(s): TSH, T4TOTAL, T3FREE, THYROIDAB in the last 72 hours.  Invalid input(s): FREET3 Anemia work up: No results for input(s): VITAMINB12, FOLATE, FERRITIN, TIBC, IRON, RETICCTPCT in the last 72 hours. Sepsis Labs: Recent Labs  Lab 03/07/24 2327 03/10/24 0103 03/10/24 0115  WBC 7.5 7.5  --   LATICACIDVEN  --   --  1.6   Microbiology Recent Results (from the past 240 hours)  Blood culture (routine x 2)     Status: None (Preliminary result)   Collection Time: 03/10/24 12:59 AM    Specimen: BLOOD LEFT FOREARM  Result Value Ref Range Status   Specimen Description BLOOD LEFT FOREARM  Final   Special Requests   Final    BOTTLES DRAWN AEROBIC AND ANAEROBIC Blood Culture adequate volume   Culture   Final    NO GROWTH 3 DAYS Performed at [facility], [address], [place], [place] [address]    Report Status PENDING  Incomplete  Resp panel by RT-PCR (RSV, Flu A&B, Covid)     Status: None   Collection Time: 03/10/24 12:59 AM   Specimen: Nasal Swab  Result Value Ref Range Status   SARS Coronavirus 2 by RT PCR NEGATIVE NEGATIVE Final   Influenza A by PCR NEGATIVE NEGATIVE Final   Influenza B by PCR NEGATIVE NEGATIVE Final    Comment: (NOTE) The Xpert Xpress SARS-CoV-2/FLU/RSV plus assay is intended as an aid in the diagnosis of influenza from Nasopharyngeal swab specimens and should not be used as a sole basis for treatment. Nasal washings and aspirates are unacceptable for Xpert Xpress SARS-CoV-2/FLU/RSV testing.  Fact Sheet for Patients: bloggercourse.com  Fact Sheet for Healthcare Providers: seriousbroker.it  This test is not yet approved or cleared by the [place]  FDA and has been authorized for detection and/or diagnosis of SARS-CoV-2 by FDA under an Emergency Use Authorization (EUA). This EUA will remain in effect (meaning this test can be used) for the duration of the COVID-19 declaration under Section 564(b)(1) of the Act, 21 U.S.C. section 360bbb-3(b)(1), unless the authorization is terminated or revoked.     Resp Syncytial Virus by PCR NEGATIVE NEGATIVE Final    Comment: (NOTE) Fact Sheet for Patients: bloggercourse.com  Fact Sheet for Healthcare Providers: seriousbroker.it  This test is not yet approved or cleared by the [place]  FDA and has been authorized for detection and/or diagnosis of SARS-CoV-2 by FDA under an  Emergency Use Authorization (EUA). This EUA will remain in effect (meaning this test can be used) for the duration of the COVID-19 declaration under Section 564(b)(1) of the Act, 21 U.S.C. section 360bbb-3(b)(1), unless the authorization is terminated or revoked.  Performed at [facility], [address] [place], [place] [address]   Blood culture (routine x 2)     Status: None (Preliminary result)   Collection Time: 03/10/24  1:04 AM   Specimen: BLOOD  Result Value Ref Range Status   Specimen Description BLOOD LEFT ANTECUBITAL  Final   Special Requests   Final    BOTTLES DRAWN AEROBIC AND ANAEROBIC Blood Culture adequate volume   Culture   Final  NO GROWTH 3 DAYS Performed at [facility], [address], [place], [place] [address]    Report Status PENDING  Incomplete     Medications:    apixaban   5 mg Oral BID   donepezil   10 mg Oral QHS   doxycycline   100 mg Oral BID   furosemide   20 mg Oral Daily   mirabegron  ER  50 mg Oral q morning   nitrofurantoin  (macrocrystal-monohydrate)  100 mg Oral QPM   rosuvastatin   5 mg Oral QODAY   saccharomyces boulardii  250 mg Oral BID   Continuous Infusions:    LOS: 3 days   [name] [name]  [facility]  03/14/2024, 7:54 AM

## 2024-03-14 NOTE — TOC Transition Note (Addendum)
 Transition of Care (TOC) - Discharge Note Rayfield Gobble RN, BSN Inpatient Care Management Unit 4E- RN Case Manager See Treatment Team for direct phone #   Patient Details  Name: [name] [name] MRN: [id number] Date of Birth: Feb 25, 1941  Transition of Care [facility]) CM/SW Contact:  Gobble Rayfield Hurst, RN Phone Number: 03/14/2024, 2:44 PM   Clinical Narrative:    Notified by CSW that daughter is now prepared to take pt home, no longer wanting SNF placement. Will need HHPT/OT orders.   TC made to daughter [name]- to discuss transition home. Per daughter she feels pt is doing much better and was able to walk in hall today with therapy. [name] voiced she feels she can take pt home now. Choice offered for [facility]- per [name] they have used several agencies in past- [facility], [facility]- daughter voiced she would prefer [facility] at this time.   Daughter confirmed pt has RW and BSC at home, she is declining wheelchair and/or transport chair at this time. No other DME needs identified.   Daughter states she will transport home.   1430- Referral sent to [facility] in [facility] as well as msg to liaison to see if they can service- referral pending approval.  Update- 1540- referral has been accepted by [facility]- anticipate [facility] visit for Sat 1/17     Final next level of care: Home w Home Health Services Barriers to Discharge: Barriers Resolved   Patient Goals and CMS Choice Patient states their goals for this hospitalization and ongoing recovery are:: return home   Choice offered to / list presented to : Adult Children      Discharge Placement               Home w/ [facility]        Discharge Plan and Services Additional resources added to the After Visit Summary for   In-house Referral: Clinical Social Work Discharge Planning Services: CM Consult Post Acute Care Choice: Home Health, Durable Medical Equipment          DME Arranged: N/A DME Agency: NA       HH Arranged: PT,  OT HH Agency: [facility] Date HH Agency Contacted: 03/14/24 Time HH Agency Contacted: 1443 Representative spoke with at [facility] Agency: [facility]  Social Drivers of Health (SDOH) Interventions SDOH Screenings   Food Insecurity: No Food Insecurity (03/10/2024)  Housing: Low Risk (03/10/2024)  Transportation Needs: No Transportation Needs (03/10/2024)  Utilities: Not At Risk (03/10/2024)  Financial Resource Strain: Low Risk (10/11/2023)   Received from [facility]  Physical Activity: Inactive (10/11/2023)   Received from [facility]  Social Connections: Moderately Integrated (03/10/2024)  Stress: No Stress Concern Present (10/11/2023)   Received from [facility]  Tobacco Use: Medium Risk (03/10/2024)     Readmission Risk Interventions    03/14/2024    2:44 PM 01/24/2024    2:41 PM  Readmission Risk Prevention Plan  Post Dischage Appt  Complete  Medication Screening  Complete  Transportation Screening Complete Complete  Home Care Screening Complete   Medication Review (RN CM) Complete

## 2024-03-14 NOTE — TOC Progression Note (Signed)
 Transition of Care [facility]) - Progression Note    Patient Details  Name: [name] [name] MRN: [id number] Date of Birth: 12-15-1940  Transition of Care [facility]) CM/SW Contact  Montie LOISE Louder, [place] Phone Number: 03/14/2024, 12:42 PM  Clinical Narrative:     12:45 pm- called [name]- she confirmed patient will d/c w/ HH. CSW updated CM.   9:45 am -CSW spoke with the patient's daughter,[name], she states they were considering [facility] but since the patient has been able to ambulate in the hallway, she is considering taking him home. She states she will update CSW once she has talked with her brother. CSW  provided contact information for her to call.  Montie Louder, MSW, LCSW Clinical Social Worker      Barriers to Discharge: Continued Medical Work up               Expected Discharge Plan and Services In-house Referral: Clinical Social Work                                             Social Drivers of Health (SDOH) Interventions SDOH Screenings   Food Insecurity: No Food Insecurity (03/10/2024)  Housing: Low Risk (03/10/2024)  Transportation Needs: No Transportation Needs (03/10/2024)  Utilities: Not At Risk (03/10/2024)  Financial Resource Strain: Low Risk (10/11/2023)   Received from [facility]  Physical Activity: Inactive (10/11/2023)   Received from [facility]  Social Connections: Moderately Integrated (03/10/2024)  Stress: No Stress Concern Present (10/11/2023)   [name]  Tobacco Use: Medium Risk (03/10/2024)    Readmission Risk Interventions    01/24/2024    2:41 PM  Readmission Risk Prevention Plan  Post Dischage Appt Complete  Medication Screening Complete  Transportation Screening Complete

## 2024-03-14 NOTE — Progress Notes (Signed)
 Mobility Specialist Progress Note;   03/14/24 0919  Mobility  Activity Ambulated with assistance;Pivoted/transferred from bed to chair  Level of Assistance Moderate assist, patient does 50-74%  Assistive Device Front wheel walker  Distance Ambulated (ft) 70 ft  Activity Response Tolerated well  Mobility Referral Yes  Mobility visit 1 Mobility  Mobility Specialist Start Time (ACUTE ONLY) 0919  Mobility Specialist Stop Time (ACUTE ONLY) 0933  Mobility Specialist Time Calculation (min) (ACUTE ONLY) 14 min   Pt pleasant and eager for mobility. Required ModA for bed mobility, MinA to stand and safely ambulate in hallway. VC required for safety awareness and navigation around obstacles in room. VSS throughout. Still limited by LLE as mobility progresses throughout session. Pt returned to room and left in chair by sink w/ NT.   Lauraine Erm Mobility Specialist Please contact via SecureChat or [employer] [phone]

## 2024-03-14 NOTE — Discharge Summary (Addendum)
 Physician Discharge Summary  [name] [name] FMW:[id number] DOB: Apr 27, 1940 DOA: 03/10/2024  PCP: McClanahan, Kyra, NP  Admit date: 03/10/2024 Discharge date: 03/14/2024  Admitted From: Home  Disposition:  Home   Recommendations for Outpatient Follow-up:  Follow up with Neurosurgery in 1-2 weeks Please obtain BMP/CBC in one week   Home Health:No  Equipment/Devices:None   Discharge Condition:Stable  CODE STATUS:Full  Diet recommendation: Heart Healthy   Brief/Interim Summary: 84 y.o. 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  Who came in to the ER on 03/07/2024 for fall leading to a small subdural hematoma.  Neurosurgery was consulted recommended to hold anticoagulation antiplatelet for at least a week.  The patient was discharged home, the patient's family member related he was more somnolent requiring 2 people assist for walking.  The family related he was independent.  Patient admitted for somnolence ambulatory dysfunction after traumatic brain injury repeated CT shows stable subdural hematoma continues to have profound weakness at baseline.   Discharge Diagnoses:  Principal Problem:   Subdural hematoma, post-traumatic (HCC) Active Problems:   Closed TBI (traumatic brain injury) (HCC)   Hyperlipidemia   OSA on CPAP   Coronary atherosclerosis   Benign essential hypertension   Chronic heart failure with preserved ejection fraction (HFpEF, >= 50%) (HCC)   CAD (nonobstructive CAD seen on LHC 2010 )   Alzheimer's dementia (HCC)   Dementia with behavioral disturbance (HCC)   AMS (altered mental status)   Pressure injury of skin  Metabolic encephalopathy  possibly due to  subdural hematoma in the setting of DOAC: Likely due to TBI neurosurgery was consulted recommended conservative management in a patient with Alzheimer's dementia. PT evaluated the patient recommended skilled family opted for home health. Patient to restart Eliquis  on 03/16/2023 as per neurorecommendations.  Left hip pain: Imaging showed no acute findings pain resolved.  All other medical chronic problems have been stable. Essential hypertension daughter relates that the patient gets profoundly hypotensive with antihypertensive medication even with small doses so these were held.  Hyperlipidemia Obstructive sleep apnea CAD Alzheimer's dementia with behavioral disturbances Stage I CKD cubitus ulcer present on admission: All these conditions were stable no changes made with her medication.  Discharge Instructions  Discharge Instructions     Increase activity slowly   Complete by: As directed    No wound care   Complete by: As directed       Allergies as of 03/14/2024       Reactions   Darifenacin Other (See Comments), Anaphylaxis   PT STATES IT MAKES HIM BONKERS AND VERY TIRED Other Reaction(s): Confusion, Confusion/Altered Mental Status PT STATES IT MAKES HIM BONKERS AND VERY TIRED, Other reaction(s): Mental Status Changes (intolerance), PT STATES IT MAKES HIM BONKERS AND VERY TIRED, PT STATES IT MAKES HIM BONKERS AND VERY TIRED   Enablex [darifenacin Hydrobromide Er]    PT STATES IT MAKES HIM BONKERS AND VERY TIRED   Atorvastatin Other (See Comments)   Muscle aches/weakness Other reaction(s): Other (See Comments)  Muscle aches/weakness  Muscle aches/weakness  Muscle aches/weakness  Muscle aches/weakness  Muscle aches/weakness  Muscle aches/weakness Other reaction(s): Other (See Comments), Muscle aches/weakness, Muscle aches/weakness, Muscle aches/weakness, Muscle aches/weakness Muscle aches/weakness    Other reaction(s): Other (See Comments) Muscle  aches/weakness Muscle aches/weakness  Muscle aches/weakness Muscle aches/weakness Muscle aches/weakness Muscle aches/weakness    Other reaction(s): Other (See Comments), Muscle aches/weakness, Muscle aches/weakness, Muscle aches/weakness, Muscle aches/weakness    Muscle aches/weakness  Other reaction(s): Other (See Comments) Muscle aches/weakness Muscle aches/weakness Muscle aches/weakness Muscle aches/weakness Muscle aches/weakness Muscle aches/weakness   Ceftriaxone Other (See Comments)   Unknown reaction Other reaction(s): Other (See Comments)  Unknown reaction  Unknown reaction    Unknown reaction  Unknown reaction  Unknown reaction Other reaction(s): Other (See Comments), Unknown reaction, Unknown reaction, , Unknown reaction Unknown reaction    Other reaction(s): Other (See Comments) Unknown reaction Unknown reaction  Unknown reaction Unknown reaction Unknown reaction    Other reaction(s): Other (See Comments), Unknown reaction, Unknown reaction, , Unknown reaction    Unknown reaction  Other reaction(s): Other (See Comments) Unknown reaction Unknown reaction  Unknown reaction Unknown reaction Unknown reaction   Ezetimibe Other (See Comments)   Muscle aches/weakness Other reaction(s): Other (See Comments)  Muscle aches/weakness  Muscle aches/weakness  Muscle aches/weakness Other reaction(s): Other (See Comments), Muscle aches/weakness, Muscle aches/weakness Other reaction(s): Other (See Comments) Muscle aches/weakness Muscle aches/weakness Muscle aches/weakness    Other reaction(s): Other (See Comments), Muscle aches/weakness, Muscle aches/weakness    Muscle aches/weakness  Other reaction(s): Other (See Comments) Muscle aches/weakness Muscle aches/weakness Muscle aches/weakness   Metoprolol  Rash, Dermatitis   Oxybutynin Other (See Comments)   Memory impairment, temperament changes Other Reaction(s): Confusion Other reaction(s): Mental Status Changes (intolerance), Memory  impairment, temperament changes, Memory impairment, temperament changes, Memory impairment, temperament changes, , Memory impairment, temperament changes   Sulfamethoxazole Other (See Comments)   Childhood allergy Other reaction(s): Other (See Comments), Childhood allergy, Childhood allergy   Sulfasalazine Other (See Comments)   Childhood allergy Other reaction(s): Other (See Comments), Unknown per daughter, Unknown per daughter   Levofloxacin Diarrhea, Other (See Comments)   Other Reaction(s): Mental Status Changes Caused diarheer a   Sulfonamide Derivatives Other (See Comments)   Childhood allergy        Medication List     TAKE these medications    acetaminophen  325 MG tablet Commonly known as: TYLENOL  Take 2 tablets (650 mg total) by mouth every 6 (six) hours as needed for mild pain, fever or headache.   diclofenac  Sodium 1 % Gel Commonly known as: VOLTAREN  Apply 4 g topically 4 (four) times daily. What changed:  when to take this reasons to take this   donepezil  10 MG tablet Commonly known as: ARICEPT  Take 10 mg by mouth at bedtime.   doxycycline  100 MG capsule Commonly known as: VIBRAMYCIN  Take 100 mg by mouth 2 (two) times daily.   Eliquis  5 MG Tabs tablet Generic drug: apixaban  TAKE 1 TABLET(5 MG) BY MOUTH TWICE DAILY   Fish Oil  1200 MG Caps Take 1,200 mg by mouth 2 (two) times daily.   furosemide  20 MG tablet Commonly known as: LASIX  Take one tablet daily. You may take an extra tablet for swelling, weight gain of 2 pounds overnight or 5 pounds in one week.   ketoconazole 2 % cream Commonly known as: NIZORAL Apply 1 application  topically daily as needed for irritation.   metroNIDAZOLE  1 % gel Commonly known as: METROGEL  Apply 1 Application topically daily.   mirabegron  ER 50 MG Tb24 tablet Commonly known as: MYRBETRIQ  Take 50 mg by mouth every morning.   nitrofurantoin  (macrocrystal-monohydrate) 100 MG capsule Commonly known as: MACROBID  Take  1 capsule (100 mg total) by mouth in the morning. What changed: when to take this  ondansetron  4 MG disintegrating tablet Commonly known as: ZOFRAN -ODT Take 1 tablet (4 mg total) by mouth every 8 (eight) hours as needed for nausea or vomiting.   PRESCRIPTION MEDICATION Inhale into the lungs at bedtime. CPAP   rosuvastatin  10 MG tablet Commonly known as: CRESTOR  Take 0.5 tablets (5 mg total) by mouth every other day. What changed: additional instructions   saccharomyces boulardii 250 MG capsule Commonly known as: FLORASTOR Take 1 capsule (250 mg total) by mouth 2 (two) times daily.   terbinafine 1 % cream Commonly known as: LAMISIL Apply 1 Application topically daily as needed (rash).   Toviaz  8 MG Tb24 tablet Generic drug: fesoterodine  Take 8 mg by mouth daily.   triamcinolone  cream 0.1 % Commonly known as: KENALOG  Apply 1 Application topically 2 (two) times daily as needed (rash).        Allergies[1]  Consultations: None   Procedures/Studies: DG FEMUR PORT MIN 2 VIEWS LEFT Result Date: 03/12/2024 EXAM: 2 VIEW(S) XRAY OF THE LEFT FEMUR 03/12/2024 03:00:21 PM COMPARISON: None available. CLINICAL HISTORY: History of trauma. FINDINGS: BONES AND JOINTS: No acute fracture. No malalignment. 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 Mild degenerative changes of the knee. SOFT TISSUES: Unremarkable. IMPRESSION: 1. No acute findings. Electronically signed by: Waddell Calk MD 03/12/2024 05:01 PM EST RP Workstation: [id number]   CT Head Wo Contrast Result Date: 03/10/2024 EXAM: CT HEAD WITHOUT CONTRAST 03/10/2024 01:20:16 AM TECHNIQUE: CT of the head was performed without the administration of intravenous contrast. Automated exposure control, iterative reconstruction, and/or weight based adjustment of the mA/kV was utilized to reduce the radiation dose to as low as reasonably achievable. COMPARISON: 03/08/2024 CLINICAL HISTORY: Delirium; recent SDH, AMS today w/fever. FINDINGS: BRAIN  AND VENTRICLES: No acute hemorrhage. Small residual left subdural hematoma measuring 4 mm (image 23), previously 5 mm. Left basal ganglia lacunar infarct. Intracranial atherosclerosis. Subcortical and periventricular small vessel ischemic changes. No hydrocephalus. No mass effect or midline shift. ORBITS: No acute abnormality. SINUSES: No acute abnormality. SOFT TISSUES AND SKULL: No acute soft tissue abnormality. No skull fracture. IMPRESSION: 1. Small residual left subdural hematoma, mildly improved. Electronically signed by: Pinkie Pebbles MD MD 03/10/2024 01:23 AM EST RP Workstation: [id number]   DG Chest Port 1 View Result Date: 03/10/2024 EXAM: 1 VIEW(S) XRAY OF THE CHEST 03/10/2024 01:11:45 AM COMPARISON: None available. CLINICAL HISTORY: fever, AMS FINDINGS: LINES, TUBES AND DEVICES: Left subclavian pacemaker. LUNGS AND PLEURA: Low lung volumes with mild basilar opacities, likely atelectasis. No pleural effusion. No pneumothorax. HEART AND MEDIASTINUM: Left subclavian pacemaker. No acute abnormality of the cardiac and mediastinal silhouettes. BONES AND SOFT TISSUES: No acute osseous abnormality. IMPRESSION: 1. Low lung volumes with mild basilar opacities, likely atelectasis. Electronically signed by: Pinkie Pebbles MD MD 03/10/2024 01:16 AM EST RP Workstation: [id number]   CT HEAD WO CONTRAST ( ) Result Date: 03/08/2024 EXAM: CT HEAD WITHOUT CONTRAST 03/08/2024 04:06:24 AM TECHNIQUE: CT of the head was performed without the administration of intravenous contrast. Automated exposure control, iterative reconstruction, and/or weight based adjustment of the mA/kV was utilized to reduce the radiation dose to as low as reasonably achievable. COMPARISON: Head CT 03/07/2024, Brain MRI 11/17/2022. CLINICAL HISTORY: 84 year old male with left subdural hematoma after fall down stairs. FINDINGS: BRAIN AND VENTRICLES: Hyperdense left subdural hematoma appears 1 mm smaller in thickness, now 3 to 5 mm along the  left lateral convexity (maximal on coronal image 48). Subtle mass effect on the left hemisphere. No midline shift. Stable gray-white differentiation. No new intracranial hemorrhage. Normal  basilar cisterns. No suspicious intracranial vascular hyperdensity. Calcified atherosclerosis at the skull base. No evidence of acute infarct. No hydrocephalus. ORBITS: No acute abnormality. SINUSES: Stable mild paranasal sinus mucosal thickening. Tympanic cavities and mastoids remain well aerated. SOFT TISSUES AND SKULL: No acute soft tissue abnormality. No skull fracture. IMPRESSION: 1. Left subdural hematoma 1 mm smaller since yesterday, now up to 5 mm. 2. No midline shift or new intracranial abnormality. Electronically signed by: Helayne Hurst MD MD 03/08/2024 04:17 AM EST RP Workstation: [id number]   DG Hip Unilat W or Wo Pelvis 2-3 Views Left Result Date: 03/07/2024 CLINICAL DATA:  Hip pain post fall EXAM: DG HIP (WITH OR WITHOUT PELVIS) 2-3V LEFT COMPARISON:  09/14/2018 FINDINGS: SI joints are non widened. Pubic symphysis and rami appear intact. Mild right femoroacetabular degenerative change. Left hip replacement with intact hardware and normal alignment. No definitive fracture IMPRESSION: Left hip replacement with intact hardware and normal alignment. No definitive fracture. Electronically Signed   By: Luke Bun M.D.   On: 03/07/2024 22:34   DG Chest Portable 1 View Result Date: 03/07/2024 CLINICAL DATA:  Fall EXAM: PORTABLE CHEST 1 VIEW COMPARISON:  01/24/2024, 06/19/2023 FINDINGS: Left-sided pacing device with similar lead orientation, projecting over right atrium and probably the right ventricle. Stable cardiomediastinal silhouette. No acute airspace disease, pleural effusion or pneumothorax. IMPRESSION: No active disease. Electronically Signed   By: Luke Bun M.D.   On: 03/07/2024 22:33   CT Maxillofacial Wo Contrast Result Date: 03/07/2024 EXAM: CT OF THE FACE WITHOUT CONTRAST 03/07/2024 10:13:43 PM  TECHNIQUE: CT of the face was performed without the administration of intravenous contrast. Multiplanar reformatted images are provided for review. Automated exposure control, iterative reconstruction, and/or weight based adjustment of the mA/kV was utilized to reduce the radiation dose to as low as reasonably achievable. COMPARISON: None available. CLINICAL HISTORY: Facial trauma, blunt FINDINGS: FACIAL BONES: No acute facial fracture. No mandibular dislocation. No suspicious bone lesion. ORBITS: Globes are intact. No acute traumatic injury. No inflammatory change. SINUSES AND MASTOIDS: No acute abnormality. SOFT TISSUES: No acute abnormality. IMPRESSION: 1. No acute facial fracture. Electronically signed by: Pinkie Pebbles MD MD 03/07/2024 10:27 PM EST RP Workstation: [id number]   CT Cervical Spine Wo Contrast Result Date: 03/07/2024 EXAM: CT CERVICAL SPINE WITHOUT CONTRAST 03/07/2024 10:13:43 PM TECHNIQUE: CT of the cervical spine was performed without the administration of intravenous contrast. Multiplanar reformatted images are provided for review. Automated exposure control, iterative reconstruction, and/or weight based adjustment of the mA/kV was utilized to reduce the radiation dose to as low as reasonably achievable. COMPARISON: None available. CLINICAL HISTORY: Neck trauma (Age >= 65y). FINDINGS: BONES AND ALIGNMENT: No acute fracture or traumatic malalignment. DEGENERATIVE CHANGES: Mild degenerative changes of the mid to lower cervical spine. SOFT TISSUES: No prevertebral soft tissue swelling. IMPRESSION: 1. No evidence of acute traumatic injury. Electronically signed by: Pinkie Pebbles MD MD 03/07/2024 10:25 PM EST RP Workstation: [id number]   CT Head Wo Contrast Result Date: 03/07/2024 EXAM: CT HEAD WITHOUT CONTRAST 03/07/2024 10:13:43 PM TECHNIQUE: CT of the head was performed without the administration of intravenous contrast. Automated exposure control, iterative reconstruction, and/or weight  based adjustment of the mA/kV was utilized to reduce the radiation dose to as low as reasonably achievable. COMPARISON: 11/17/2022 CLINICAL HISTORY: Head trauma, minor (Age >= 65y). FINDINGS: BRAIN AND VENTRICLES: Acute left subdural hematoma along left frontotemporal convexity measuring up to 6 mm in thickness. No evidence of acute infarct. No hydrocephalus. Patchy hypoattenuation of periventricular and subcortical white  matter, consistent with chronic microvascular ischemic change. Generalized cerebral atrophy. Intracranial arterial calcification. No mass effect or midline shift. ORBITS: No acute abnormality. SINUSES: No acute abnormality. SOFT TISSUES AND SKULL: No acute soft tissue abnormality. No skull fracture. IMPRESSION: 1. Acute left subdural hematoma, measuring up to 6 mm in thickness, without midline shift. 2. Critical Value/emergent results were called by telephone at the time of interpretation on 03/07/2024 at 22123 hrs to provider Dr Andrea Ness. Electronically signed by: Pinkie Pebbles MD MD 03/07/2024 10:25 PM EST RP Workstation: [id number]   DG Elbow Complete Right Result Date: 03/07/2024 EXAM: 3 VIEW(S) XRAY OF THE ELBOW COMPARISON: None available. CLINICAL HISTORY: fall, injury to elbow FINDINGS: BONES AND JOINTS: Mild degenerative changes of the elbow. Possible small joint effusion. The lateral view is technically limited secondary to patient positioning. SOFT TISSUES: Unremarkable. IMPRESSION: 1. No acute fracture or dislocation. 2. Possible small joint effusion. Electronically signed by: Greig Pique MD MD 03/07/2024 08:44 PM EST RP Workstation: [id number]   CUP PACEART REMOTE DEVICE CHECK Result Date: 03/07/2024 PPM Scheduled remote reviewed. Normal device function.  Presenting rhythm: AS-VP. Next remote transmission per protocol. - CS, CVRS  (Echo, Carotid, EGD, Colonoscopy, ERCP)    Subjective: No complains  Discharge Exam: Vitals:   03/14/24 1001 03/14/24 1225  BP: 134/69  127/68  Pulse: 77 72  Resp: 20 15  Temp: 98.5 F (36.9 C) 98.6 F (37 C)  SpO2: 98% 98%   Vitals:   03/13/24 2322 03/14/24 0411 03/14/24 1001 03/14/24 1225  BP: (!) 149/76 (!) 140/72 134/69 127/68  Pulse: 76 68 77 72  Resp: 20 20 20 15   Temp: 98 F (36.7 C) 98.1 F (36.7 C) 98.5 F (36.9 C) 98.6 F (37 C)  TempSrc: Oral Oral Oral Oral  SpO2: 100%  98% 98%  Weight:      Height:        General: Pt is alert, awake, not in acute distress Cardiovascular: RRR, S1/S2 +, no rubs, no gallops Respiratory: CTA bilaterally, no wheezing, no rhonchi Abdominal: Soft, NT, ND, bowel sounds + Extremities: no edema, no cyanosis    The results of significant diagnostics from this hospitalization (including imaging, microbiology, ancillary and laboratory) are listed below for reference.     Microbiology: Recent Results (from the past 240 hours)  Blood culture (routine x 2)     Status: None (Preliminary result)   Collection Time: 03/10/24 12:59 AM   Specimen: BLOOD LEFT FOREARM  Result Value Ref Range Status   Specimen Description BLOOD LEFT FOREARM  Final   Special Requests   Final    BOTTLES DRAWN AEROBIC AND ANAEROBIC Blood Culture adequate volume   Culture   Final    NO GROWTH 4 DAYS Performed at [facility], [address] [place], [place] [address]    Report Status PENDING  Incomplete  Resp panel by RT-PCR (RSV, Flu A&B, Covid)     Status: None   Collection Time: 03/10/24 12:59 AM   Specimen: Nasal Swab  Result Value Ref Range Status   SARS Coronavirus 2 by RT PCR NEGATIVE NEGATIVE Final   Influenza A by PCR NEGATIVE NEGATIVE Final   Influenza B by PCR NEGATIVE NEGATIVE Final    Comment: (NOTE) The Xpert Xpress SARS-CoV-2/FLU/RSV plus assay is intended as an aid in the diagnosis of influenza from Nasopharyngeal swab specimens and should not be used as a sole basis for treatment. Nasal washings and aspirates are unacceptable for Xpert Xpress  SARS-CoV-2/FLU/RSV  testing.  Fact Sheet for Patients: bloggercourse.com  Fact Sheet for Healthcare Providers: seriousbroker.it  This test is not yet approved or cleared by the [place]  FDA and has been authorized for detection and/or diagnosis of SARS-CoV-2 by FDA under an Emergency Use Authorization (EUA). This EUA will remain in effect (meaning this test can be used) for the duration of the COVID-19 declaration under Section 564(b)(1) of the Act, 21 U.S.C. section 360bbb-3(b)(1), unless the authorization is terminated or revoked.     Resp Syncytial Virus by PCR NEGATIVE NEGATIVE Final    Comment: (NOTE) Fact Sheet for Patients: bloggercourse.com  Fact Sheet for Healthcare Providers: seriousbroker.it  This test is not yet approved or cleared by the [place]  FDA and has been authorized for detection and/or diagnosis of SARS-CoV-2 by FDA under an Emergency Use Authorization (EUA). This EUA will remain in effect (meaning this test can be used) for the duration of the COVID-19 declaration under Section 564(b)(1) of the Act, 21 U.S.C. section 360bbb-3(b)(1), unless the authorization is terminated or revoked.  Performed at [facility], [address], [place], [place] [address]   Blood culture (routine x 2)     Status: None (Preliminary result)   Collection Time: 03/10/24  1:04 AM   Specimen: BLOOD  Result Value Ref Range Status   Specimen Description BLOOD LEFT ANTECUBITAL  Final   Special Requests   Final    BOTTLES DRAWN AEROBIC AND ANAEROBIC Blood Culture adequate volume   Culture   Final    NO GROWTH 4 DAYS Performed at [facility], [address], [place], [place] [address]    Report Status PENDING  Incomplete     Labs: BNP (last 3 results) Recent Labs    03/17/23 1145 06/19/23 1130 06/27/23 1612  BNP 86.4 227.1* 89.2   Basic Metabolic  Panel: Recent Labs  Lab 03/07/24 2327 03/10/24 0103 03/11/24 0259  NA 141 142 140  K 3.9 4.3 3.8  CL 106 105 107  CO2 27 25 25   GLUCOSE 113* 137* 106*  BUN 22 27* 24*  CREATININE 1.17 1.23 0.96  CALCIUM  10.8* 10.8* 9.7   Liver Function Tests: Recent Labs  Lab 03/10/24 0103  AST 29  ALT 24  ALKPHOS 80  BILITOT 0.9  PROT 8.8*  ALBUMIN 4.5   No results for input(s): LIPASE, AMYLASE in the last 168 hours. No results for input(s): AMMONIA in the last 168 hours. CBC: Recent Labs  Lab 03/07/24 2327 03/10/24 0103  WBC 7.5 7.5  NEUTROABS 5.7 5.4  HGB 12.6* 14.7  HCT 37.5* 43.9  MCV 93.1 95.9  PLT 200 202   Cardiac Enzymes: No results for input(s): CKTOTAL, CKMB, CKMBINDEX, TROPONINI in the last 168 hours. BNP: Invalid input(s): POCBNP CBG: No results for input(s): GLUCAP in the last 168 hours. D-Dimer No results for input(s): DDIMER in the last 72 hours. Hgb A1c No results for input(s): HGBA1C in the last 72 hours. Lipid Profile No results for input(s): CHOL, HDL, LDLCALC, TRIG, CHOLHDL, LDLDIRECT in the last 72 hours. Thyroid  function studies No results for input(s): TSH, T4TOTAL, T3FREE, THYROIDAB in the last 72 hours.  Invalid input(s): FREET3 Anemia work up No results for input(s): VITAMINB12, FOLATE, FERRITIN, TIBC, IRON, RETICCTPCT in the last 72 hours. Urinalysis    Component Value Date/Time   COLORURINE YELLOW 03/10/2024 0223   APPEARANCEUR CLEAR 03/10/2024 0223   LABSPEC 1.023 03/10/2024 0223   PHURINE 5.0 03/10/2024 0223   GLUCOSEU NEGATIVE 03/10/2024  0223   HGBUR NEGATIVE 03/10/2024 0223   BILIRUBINUR NEGATIVE 03/10/2024 0223   KETONESUR NEGATIVE 03/10/2024 0223   PROTEINUR NEGATIVE 03/10/2024 0223   NITRITE NEGATIVE 03/10/2024 0223   LEUKOCYTESUR NEGATIVE 03/10/2024 0223   Sepsis Labs Recent Labs  Lab 03/07/24 2327 03/10/24 0103  WBC 7.5 7.5   Microbiology Recent Results (from  the past 240 hours)  Blood culture (routine x 2)     Status: None (Preliminary result)   Collection Time: 03/10/24 12:59 AM   Specimen: BLOOD LEFT FOREARM  Result Value Ref Range Status   Specimen Description BLOOD LEFT FOREARM  Final   Special Requests   Final    BOTTLES DRAWN AEROBIC AND ANAEROBIC Blood Culture adequate volume   Culture   Final    NO GROWTH 4 DAYS Performed at [facility], [address] [place], [place] [address]    Report Status PENDING  Incomplete  Resp panel by RT-PCR (RSV, Flu A&B, Covid)     Status: None   Collection Time: 03/10/24 12:59 AM   Specimen: Nasal Swab  Result Value Ref Range Status   SARS Coronavirus 2 by RT PCR NEGATIVE NEGATIVE Final   Influenza A by PCR NEGATIVE NEGATIVE Final   Influenza B by PCR NEGATIVE NEGATIVE Final    Comment: (NOTE) The Xpert Xpress SARS-CoV-2/FLU/RSV plus assay is intended as an aid in the diagnosis of influenza from Nasopharyngeal swab specimens and should not be used as a sole basis for treatment. Nasal washings and aspirates are unacceptable for Xpert Xpress SARS-CoV-2/FLU/RSV testing.  Fact Sheet for Patients: bloggercourse.com  Fact Sheet for Healthcare Providers: seriousbroker.it  This test is not yet approved or cleared by the [place]  FDA and has been authorized for detection and/or diagnosis of SARS-CoV-2 by FDA under an Emergency Use Authorization (EUA). This EUA will remain in effect (meaning this test can be used) for the duration of the COVID-19 declaration under Section 564(b)(1) of the Act, 21 U.S.C. section 360bbb-3(b)(1), unless the authorization is terminated or revoked.     Resp Syncytial Virus by PCR NEGATIVE NEGATIVE Final    Comment: (NOTE) Fact Sheet for Patients: bloggercourse.com  Fact Sheet for Healthcare Providers: seriousbroker.it  This test is not yet approved or  cleared by the [place]  FDA and has been authorized for detection and/or diagnosis of SARS-CoV-2 by FDA under an Emergency Use Authorization (EUA). This EUA will remain in effect (meaning this test can be used) for the duration of the COVID-19 declaration under Section 564(b)(1) of the Act, 21 U.S.C. section 360bbb-3(b)(1), unless the authorization is terminated or revoked.  Performed at [facility], [address], [place], [place] [address]   Blood culture (routine x 2)     Status: None (Preliminary result)   Collection Time: 03/10/24  1:04 AM   Specimen: BLOOD  Result Value Ref Range Status   Specimen Description BLOOD LEFT ANTECUBITAL  Final   Special Requests   Final    BOTTLES DRAWN AEROBIC AND ANAEROBIC Blood Culture adequate volume   Culture   Final    NO GROWTH 4 DAYS Performed at [facility], [address] [place], [place] [address]    Report Status PENDING  Incomplete     Time coordinating discharge: Over 35 minutes  SIGNED:   Erle Odell Castor, MD  [facility] 03/14/2024, 2:27 PM Pager   If 7PM-7AM, please contact night-coverage [email or web address] Password [email or web address]     [1]  Allergies Allergen Reactions  Darifenacin Other (See Comments) and Anaphylaxis    PT STATES IT MAKES HIM BONKERS AND VERY TIRED  Other Reaction(s): Confusion, Confusion/Altered Mental Status  PT STATES IT MAKES HIM BONKERS AND VERY TIRED, Other reaction(s): Mental Status Changes (intolerance), PT STATES IT MAKES HIM BONKERS AND VERY TIRED, PT STATES IT MAKES HIM BONKERS AND VERY TIRED   Enablex [Darifenacin Hydrobromide Er]     PT STATES IT MAKES HIM BONKERS AND VERY TIRED   Atorvastatin Other (See Comments)    Muscle aches/weakness  Other reaction(s): Other (See Comments)  Muscle aches/weakness  Muscle aches/weakness  Muscle aches/weakness  Muscle aches/weakness  Muscle aches/weakness  Muscle aches/weakness  Other reaction(s): Other (See  Comments), Muscle aches/weakness, Muscle aches/weakness, Muscle aches/weakness, Muscle aches/weakness  Muscle aches/weakness    Other reaction(s): Other (See Comments) Muscle aches/weakness Muscle aches/weakness Muscle aches/weakness Muscle aches/weakness Muscle aches/weakness Muscle aches/weakness    Other reaction(s): Other (See Comments), Muscle aches/weakness, Muscle aches/weakness, Muscle aches/weakness, Muscle aches/weakness    Muscle aches/weakness  Other reaction(s): Other (See Comments) Muscle aches/weakness Muscle aches/weakness Muscle aches/weakness Muscle aches/weakness Muscle aches/weakness Muscle aches/weakness   Ceftriaxone Other (See Comments)    Unknown reaction  Other reaction(s): Other (See Comments)  Unknown reaction  Unknown reaction    Unknown reaction  Unknown reaction  Unknown reaction  Other reaction(s): Other (See Comments), Unknown reaction, Unknown reaction, , Unknown reaction  Unknown reaction    Other reaction(s): Other (See Comments) Unknown reaction Unknown reaction  Unknown reaction Unknown reaction Unknown reaction    Other reaction(s): Other (See Comments), Unknown reaction, Unknown reaction, , Unknown reaction    Unknown reaction  Other reaction(s): Other (See Comments) Unknown reaction Unknown reaction  Unknown reaction Unknown reaction Unknown reaction   Ezetimibe Other (See Comments)    Muscle aches/weakness  Other reaction(s): Other (See Comments)  Muscle aches/weakness  Muscle aches/weakness  Muscle aches/weakness  Other reaction(s): Other (See Comments), Muscle aches/weakness, Muscle aches/weakness  Other reaction(s): Other (See Comments) Muscle aches/weakness Muscle aches/weakness Muscle aches/weakness    Other reaction(s): Other (See Comments), Muscle aches/weakness, Muscle aches/weakness    Muscle aches/weakness  Other reaction(s): Other (See Comments) Muscle aches/weakness Muscle aches/weakness Muscle aches/weakness    Metoprolol  Rash and Dermatitis   Oxybutynin Other (See Comments)    Memory impairment, temperament changes  Other Reaction(s): Confusion  Other reaction(s): Mental Status Changes (intolerance), Memory impairment, temperament changes, Memory impairment, temperament changes, Memory impairment, temperament changes, , Memory impairment, temperament changes   Sulfamethoxazole Other (See Comments)    Childhood allergy  Other reaction(s): Other (See Comments), Childhood allergy, Childhood allergy   Sulfasalazine Other (See Comments)    Childhood allergy  Other reaction(s): Other (See Comments), Unknown per daughter, Unknown per daughter   Levofloxacin Diarrhea and Other (See Comments)    Other Reaction(s): Mental Status Changes  Caused diarheer a   Sulfonamide Derivatives Other (See Comments)    Childhood allergy

## 2024-03-15 LAB — CULTURE, BLOOD (ROUTINE X 2)
Culture: NO GROWTH
Culture: NO GROWTH
Special Requests: ADEQUATE
Special Requests: ADEQUATE

## 2024-03-19 ENCOUNTER — Other Ambulatory Visit: Payer: Self-pay

## 2024-03-19 DIAGNOSIS — S065XAA Traumatic subdural hemorrhage with loss of consciousness status unknown, initial encounter: Secondary | ICD-10-CM

## 2024-04-04 ENCOUNTER — Telehealth: Payer: Self-pay | Admitting: Cardiology

## 2024-04-04 NOTE — Telephone Encounter (Signed)
 Pt daughter called in to report pt noted with increased SOB.  Pt was unable to wash dishes d/t SOB.  Started several days ago.  Weight increase from 179 lbs yesterday to 185 lbs today.  Pt was previously losing weight.     Pt has taken scheduled furosemide  but not as needed.  Advised daughter to give pt an additional dose of furosemide .  Pt is incontinent so unable to tell just how much he is urinating.  BP 138/75-83 today.  Denies palpitations, chest discomfort and swelling.  Denies pain with a deep breath.   Advised daughter of ED precautions.  Will send to MD for review.

## 2024-04-04 NOTE — Telephone Encounter (Signed)
 Pt c/o Shortness Of Breath: STAT if SOB developed within the last 24 hours or pt is noticeably SOB on the phone  1. Are you currently SOB (can you hear that pt is SOB on the phone)? yes  2. How long have you been experiencing SOB? Last couple days   3. Are you SOB when sitting or when up moving around? both  4. Are you currently experiencing any other symptoms? no

## 2024-04-04 NOTE — Telephone Encounter (Signed)
 Per Dr. Jordan: Looks like recently DC on lasix  20 mg daily. He should take twice a day for 4 days or until weight comes back down.   Spoke with daughter, per DPR. Given recommendations from Dr Jordan. Verbalizes understanding of plan and will update as necessary.

## 2024-04-16 ENCOUNTER — Other Ambulatory Visit

## 2024-05-06 ENCOUNTER — Ambulatory Visit: Admitting: Cardiology

## 2024-05-06 ENCOUNTER — Ambulatory Visit: Admitting: Pulmonary Disease

## 2024-06-05 ENCOUNTER — Ambulatory Visit

## 2024-09-04 ENCOUNTER — Ambulatory Visit

## 2024-12-04 ENCOUNTER — Ambulatory Visit
